# Patient Record
Sex: Female | Born: 1960 | Race: Black or African American | Hispanic: No | Marital: Single | Smoking: Never smoker
Health system: Southern US, Community
[De-identification: ages and names within clinical notes are randomized; demographics above are authoritative.]

## PROBLEM LIST (undated history)

## (undated) DIAGNOSIS — I10 Essential (primary) hypertension: Secondary | ICD-10-CM

## (undated) DIAGNOSIS — R410 Disorientation, unspecified: Secondary | ICD-10-CM

## (undated) DIAGNOSIS — F319 Bipolar disorder, unspecified: Secondary | ICD-10-CM

## (undated) DIAGNOSIS — I639 Cerebral infarction, unspecified: Secondary | ICD-10-CM

## (undated) DIAGNOSIS — I69359 Hemiplegia and hemiparesis following cerebral infarction affecting unspecified side: Secondary | ICD-10-CM

## (undated) DIAGNOSIS — R569 Unspecified convulsions: Secondary | ICD-10-CM

## (undated) DIAGNOSIS — F329 Major depressive disorder, single episode, unspecified: Secondary | ICD-10-CM

## (undated) DIAGNOSIS — F32A Depression, unspecified: Secondary | ICD-10-CM

## (undated) DIAGNOSIS — E119 Type 2 diabetes mellitus without complications: Secondary | ICD-10-CM

## (undated) DIAGNOSIS — F209 Schizophrenia, unspecified: Secondary | ICD-10-CM

## (undated) DIAGNOSIS — IMO0002 Reserved for concepts with insufficient information to code with codable children: Secondary | ICD-10-CM

## (undated) HISTORY — PX: TUBAL LIGATION: SHX77

## (undated) HISTORY — PX: BREAST BIOPSY: SHX20

## (undated) HISTORY — DX: Unspecified convulsions: R56.9

---

## 1999-11-23 ENCOUNTER — Other Ambulatory Visit: Admission: RE | Admit: 1999-11-23 | Discharge: 1999-11-23 | Payer: Self-pay | Admitting: Obstetrics and Gynecology

## 2001-04-05 ENCOUNTER — Other Ambulatory Visit: Admission: RE | Admit: 2001-04-05 | Discharge: 2001-04-05 | Payer: Self-pay | Admitting: Obstetrics and Gynecology

## 2006-11-15 ENCOUNTER — Emergency Department (HOSPITAL_COMMUNITY): Admission: EM | Admit: 2006-11-15 | Discharge: 2006-11-15 | Payer: Self-pay | Admitting: Emergency Medicine

## 2007-02-02 ENCOUNTER — Emergency Department (HOSPITAL_COMMUNITY): Admission: EM | Admit: 2007-02-02 | Discharge: 2007-02-02 | Payer: Self-pay | Admitting: Emergency Medicine

## 2007-03-12 ENCOUNTER — Ambulatory Visit: Payer: Self-pay | Admitting: Internal Medicine

## 2007-03-12 ENCOUNTER — Encounter (INDEPENDENT_AMBULATORY_CARE_PROVIDER_SITE_OTHER): Payer: Self-pay | Admitting: Nurse Practitioner

## 2007-03-12 ENCOUNTER — Ambulatory Visit: Payer: Self-pay | Admitting: *Deleted

## 2007-03-12 LAB — CONVERTED CEMR LAB
ALT: 9 units/L (ref 0–35)
Albumin: 4.5 g/dL (ref 3.5–5.2)
Alkaline Phosphatase: 65 units/L (ref 39–117)
Basophils Absolute: 0 10*3/uL (ref 0.0–0.1)
CO2: 25 meq/L (ref 19–32)
Eosinophils Relative: 1 % (ref 0–5)
Glucose, Bld: 66 mg/dL — ABNORMAL LOW (ref 70–99)
HCT: 43.3 % (ref 36.0–46.0)
Lymphocytes Relative: 39 % (ref 12–46)
Lymphs Abs: 2.8 10*3/uL (ref 0.7–4.0)
Neutro Abs: 3.8 10*3/uL (ref 1.7–7.7)
Neutrophils Relative %: 53 % (ref 43–77)
Platelets: 297 10*3/uL (ref 150–400)
Potassium: 4.4 meq/L (ref 3.5–5.3)
RDW: 12.4 % (ref 11.5–15.5)
Sodium: 143 meq/L (ref 135–145)
TSH: 1.739 microintl units/mL (ref 0.350–5.50)
Total Bilirubin: 0.5 mg/dL (ref 0.3–1.2)
Total Protein: 7.7 g/dL (ref 6.0–8.3)
WBC: 7.2 10*3/uL (ref 4.0–10.5)

## 2007-03-13 ENCOUNTER — Ambulatory Visit (HOSPITAL_COMMUNITY): Admission: RE | Admit: 2007-03-13 | Discharge: 2007-03-13 | Payer: Self-pay | Admitting: Family Medicine

## 2007-05-22 ENCOUNTER — Encounter (INDEPENDENT_AMBULATORY_CARE_PROVIDER_SITE_OTHER): Payer: Self-pay | Admitting: Nurse Practitioner

## 2007-05-22 ENCOUNTER — Ambulatory Visit: Payer: Self-pay | Admitting: Internal Medicine

## 2007-06-18 ENCOUNTER — Ambulatory Visit: Payer: Self-pay | Admitting: Internal Medicine

## 2007-06-18 ENCOUNTER — Encounter (INDEPENDENT_AMBULATORY_CARE_PROVIDER_SITE_OTHER): Payer: Self-pay | Admitting: Nurse Practitioner

## 2007-06-18 LAB — CONVERTED CEMR LAB
Total CHOL/HDL Ratio: 2.9
VLDL: 20 mg/dL (ref 0–40)

## 2007-08-08 ENCOUNTER — Ambulatory Visit: Payer: Self-pay | Admitting: Internal Medicine

## 2007-09-26 ENCOUNTER — Ambulatory Visit: Payer: Self-pay | Admitting: Internal Medicine

## 2007-11-15 ENCOUNTER — Ambulatory Visit: Payer: Self-pay | Admitting: Psychiatry

## 2007-11-15 ENCOUNTER — Inpatient Hospital Stay (HOSPITAL_COMMUNITY): Admission: AD | Admit: 2007-11-15 | Discharge: 2007-11-28 | Payer: Self-pay | Admitting: Psychiatry

## 2008-02-28 ENCOUNTER — Ambulatory Visit: Payer: Self-pay | Admitting: Internal Medicine

## 2008-03-03 ENCOUNTER — Encounter (INDEPENDENT_AMBULATORY_CARE_PROVIDER_SITE_OTHER): Payer: Self-pay | Admitting: Internal Medicine

## 2008-03-03 LAB — CONVERTED CEMR LAB
AST: 28 units/L (ref 0–37)
Albumin: 4.6 g/dL (ref 3.5–5.2)
Alkaline Phosphatase: 91 units/L (ref 39–117)
Calcium: 10 mg/dL (ref 8.4–10.5)
Chloride: 99 meq/L (ref 96–112)
Potassium: 3.7 meq/L (ref 3.5–5.3)
Sodium: 139 meq/L (ref 135–145)
Total Protein: 8.1 g/dL (ref 6.0–8.3)

## 2008-03-04 ENCOUNTER — Ambulatory Visit (HOSPITAL_COMMUNITY): Admission: RE | Admit: 2008-03-04 | Discharge: 2008-03-04 | Payer: Self-pay | Admitting: Internal Medicine

## 2008-03-16 ENCOUNTER — Ambulatory Visit (HOSPITAL_COMMUNITY): Admission: RE | Admit: 2008-03-16 | Discharge: 2008-03-16 | Payer: Self-pay | Admitting: Internal Medicine

## 2008-11-18 ENCOUNTER — Ambulatory Visit: Payer: Self-pay | Admitting: Family Medicine

## 2008-11-18 ENCOUNTER — Encounter (INDEPENDENT_AMBULATORY_CARE_PROVIDER_SITE_OTHER): Payer: Self-pay | Admitting: Internal Medicine

## 2008-11-18 ENCOUNTER — Other Ambulatory Visit: Admission: RE | Admit: 2008-11-18 | Discharge: 2008-11-18 | Payer: Self-pay | Admitting: Internal Medicine

## 2008-11-20 ENCOUNTER — Ambulatory Visit: Payer: Self-pay | Admitting: Internal Medicine

## 2008-11-25 ENCOUNTER — Ambulatory Visit: Payer: Self-pay | Admitting: Internal Medicine

## 2008-11-26 ENCOUNTER — Encounter (INDEPENDENT_AMBULATORY_CARE_PROVIDER_SITE_OTHER): Payer: Self-pay | Admitting: Internal Medicine

## 2008-11-26 LAB — CONVERTED CEMR LAB
Albumin: 4.2 g/dL (ref 3.5–5.2)
Alkaline Phosphatase: 81 units/L (ref 39–117)
BUN: 11 mg/dL (ref 6–23)
Basophils Absolute: 0 10*3/uL (ref 0.0–0.1)
Basophils Relative: 0 % (ref 0–1)
Creatinine, Ser: 0.85 mg/dL (ref 0.40–1.20)
Eosinophils Absolute: 0.1 10*3/uL (ref 0.0–0.7)
Eosinophils Relative: 2 % (ref 0–5)
Glucose, Bld: 287 mg/dL — ABNORMAL HIGH (ref 70–99)
HCT: 42.6 % (ref 36.0–46.0)
Hemoglobin: 14.4 g/dL (ref 12.0–15.0)
MCHC: 33.8 g/dL (ref 30.0–36.0)
MCV: 89.5 fL (ref 78.0–100.0)
Monocytes Absolute: 0.5 10*3/uL (ref 0.1–1.0)
Platelets: 269 10*3/uL (ref 150–400)
Potassium: 4.4 meq/L (ref 3.5–5.3)
RDW: 11.9 % (ref 11.5–15.5)
Total Bilirubin: 0.5 mg/dL (ref 0.3–1.2)

## 2008-11-30 ENCOUNTER — Encounter (INDEPENDENT_AMBULATORY_CARE_PROVIDER_SITE_OTHER): Payer: Self-pay | Admitting: Internal Medicine

## 2008-11-30 LAB — CONVERTED CEMR LAB
HCV Ab: NEGATIVE
Hep A Total Ab: NEGATIVE
Hep B S Ab: NEGATIVE

## 2008-12-04 ENCOUNTER — Ambulatory Visit (HOSPITAL_COMMUNITY): Admission: RE | Admit: 2008-12-04 | Discharge: 2008-12-04 | Payer: Self-pay | Admitting: Internal Medicine

## 2008-12-09 ENCOUNTER — Ambulatory Visit: Payer: Self-pay | Admitting: Internal Medicine

## 2009-01-27 ENCOUNTER — Ambulatory Visit: Payer: Self-pay | Admitting: Family Medicine

## 2009-04-21 ENCOUNTER — Ambulatory Visit (HOSPITAL_COMMUNITY): Admission: RE | Admit: 2009-04-21 | Discharge: 2009-04-21 | Payer: Self-pay | Admitting: Gynecology

## 2009-06-30 ENCOUNTER — Encounter (INDEPENDENT_AMBULATORY_CARE_PROVIDER_SITE_OTHER): Payer: Self-pay | Admitting: Internal Medicine

## 2009-06-30 ENCOUNTER — Ambulatory Visit: Payer: Self-pay | Admitting: Family Medicine

## 2009-06-30 LAB — CONVERTED CEMR LAB
Alkaline Phosphatase: 86 units/L (ref 39–117)
Glucose, Bld: 294 mg/dL — ABNORMAL HIGH (ref 70–99)
Hgb A1c MFr Bld: 12.2 % — ABNORMAL HIGH (ref <5.7)
Sodium: 137 meq/L (ref 135–145)
Total Bilirubin: 0.5 mg/dL (ref 0.3–1.2)
Total Protein: 7.7 g/dL (ref 6.0–8.3)

## 2009-07-14 ENCOUNTER — Ambulatory Visit: Payer: Self-pay | Admitting: Internal Medicine

## 2009-08-04 ENCOUNTER — Ambulatory Visit: Payer: Self-pay | Admitting: Internal Medicine

## 2009-08-04 LAB — CONVERTED CEMR LAB
CO2: 27 meq/L (ref 19–32)
Chloride: 97 meq/L (ref 96–112)
Creatinine, Ser: 0.97 mg/dL (ref 0.40–1.20)
Sodium: 137 meq/L (ref 135–145)

## 2009-08-11 ENCOUNTER — Ambulatory Visit: Payer: Self-pay | Admitting: Internal Medicine

## 2009-09-01 ENCOUNTER — Ambulatory Visit: Payer: Self-pay | Admitting: Internal Medicine

## 2009-09-01 LAB — CONVERTED CEMR LAB
BUN: 13 mg/dL (ref 6–23)
CO2: 28 meq/L (ref 19–32)
Chloride: 100 meq/L (ref 96–112)
Creatinine, Ser: 0.87 mg/dL (ref 0.40–1.20)

## 2009-09-08 ENCOUNTER — Ambulatory Visit: Payer: Self-pay | Admitting: Internal Medicine

## 2009-10-04 ENCOUNTER — Inpatient Hospital Stay (HOSPITAL_COMMUNITY): Admission: EM | Admit: 2009-10-04 | Discharge: 2009-10-06 | Payer: Self-pay | Admitting: Emergency Medicine

## 2009-10-05 ENCOUNTER — Ambulatory Visit: Payer: Self-pay | Admitting: Psychiatry

## 2009-10-06 ENCOUNTER — Inpatient Hospital Stay (HOSPITAL_COMMUNITY): Admission: AD | Admit: 2009-10-06 | Discharge: 2009-10-10 | Payer: Self-pay | Admitting: Psychiatry

## 2009-10-06 ENCOUNTER — Ambulatory Visit: Payer: Self-pay | Admitting: Psychiatry

## 2009-10-13 ENCOUNTER — Ambulatory Visit: Payer: Self-pay | Admitting: Internal Medicine

## 2009-11-10 ENCOUNTER — Ambulatory Visit: Payer: Self-pay | Admitting: Internal Medicine

## 2009-11-10 LAB — CONVERTED CEMR LAB
Alkaline Phosphatase: 80 units/L (ref 39–117)
BUN: 11 mg/dL (ref 6–23)
Direct LDL: 105 mg/dL — ABNORMAL HIGH
Glucose, Bld: 235 mg/dL — ABNORMAL HIGH (ref 70–99)
Total Bilirubin: 0.4 mg/dL (ref 0.3–1.2)

## 2010-02-03 ENCOUNTER — Encounter (INDEPENDENT_AMBULATORY_CARE_PROVIDER_SITE_OTHER): Payer: Self-pay | Admitting: *Deleted

## 2010-02-03 ENCOUNTER — Ambulatory Visit: Payer: Self-pay | Admitting: Internal Medicine

## 2010-02-03 LAB — CONVERTED CEMR LAB
Cholesterol: 195 mg/dL (ref 0–200)
HDL: 38 mg/dL — ABNORMAL LOW (ref >39)
Hgb A1c MFr Bld: 13.6 % — ABNORMAL HIGH (ref <5.7)
Total CHOL/HDL Ratio: 5.1

## 2010-02-24 ENCOUNTER — Encounter (INDEPENDENT_AMBULATORY_CARE_PROVIDER_SITE_OTHER): Payer: Self-pay | Admitting: Family Medicine

## 2010-02-24 LAB — CONVERTED CEMR LAB
HDL: 39 mg/dL — ABNORMAL LOW (ref >39)
LDL Cholesterol: 117 mg/dL — ABNORMAL HIGH (ref 0–99)
Triglycerides: 209 mg/dL — ABNORMAL HIGH (ref <150)
VLDL: 42 mg/dL — ABNORMAL HIGH (ref 0–40)

## 2010-04-07 ENCOUNTER — Other Ambulatory Visit (HOSPITAL_COMMUNITY): Payer: Self-pay | Admitting: Family Medicine

## 2010-04-07 DIAGNOSIS — Z0000 Encounter for general adult medical examination without abnormal findings: Secondary | ICD-10-CM

## 2010-04-22 ENCOUNTER — Ambulatory Visit (HOSPITAL_COMMUNITY): Admission: RE | Admit: 2010-04-22 | Payer: Self-pay | Source: Home / Self Care | Admitting: Family Medicine

## 2010-04-22 ENCOUNTER — Ambulatory Visit (HOSPITAL_COMMUNITY)
Admission: RE | Admit: 2010-04-22 | Discharge: 2010-04-22 | Disposition: A | Discharge status: Home / Self Care | Payer: Medicaid Other | Source: Ambulatory Visit | Attending: Family Medicine | Admitting: Family Medicine

## 2010-04-22 DIAGNOSIS — Z0000 Encounter for general adult medical examination without abnormal findings: Secondary | ICD-10-CM

## 2010-04-22 DIAGNOSIS — Z1231 Encounter for screening mammogram for malignant neoplasm of breast: Secondary | ICD-10-CM | POA: Insufficient documentation

## 2010-06-04 LAB — GLUCOSE, CAPILLARY
Glucose-Capillary: 108 mg/dL — ABNORMAL HIGH (ref 70–99)
Glucose-Capillary: 147 mg/dL — ABNORMAL HIGH (ref 70–99)
Glucose-Capillary: 157 mg/dL — ABNORMAL HIGH (ref 70–99)
Glucose-Capillary: 162 mg/dL — ABNORMAL HIGH (ref 70–99)
Glucose-Capillary: 195 mg/dL — ABNORMAL HIGH (ref 70–99)
Glucose-Capillary: 217 mg/dL — ABNORMAL HIGH (ref 70–99)
Glucose-Capillary: 242 mg/dL — ABNORMAL HIGH (ref 70–99)
Glucose-Capillary: 372 mg/dL — ABNORMAL HIGH (ref 70–99)
Glucose-Capillary: 102 mg/dL — ABNORMAL HIGH (ref 70–99)
Glucose-Capillary: 127 mg/dL — ABNORMAL HIGH (ref 70–99)
Glucose-Capillary: 132 mg/dL — ABNORMAL HIGH (ref 70–99)
Glucose-Capillary: 140 mg/dL — ABNORMAL HIGH (ref 70–99)
Glucose-Capillary: 164 mg/dL — ABNORMAL HIGH (ref 70–99)
Glucose-Capillary: 196 mg/dL — ABNORMAL HIGH (ref 70–99)
Glucose-Capillary: 199 mg/dL — ABNORMAL HIGH (ref 70–99)
Glucose-Capillary: 218 mg/dL — ABNORMAL HIGH (ref 70–99)
Glucose-Capillary: 266 mg/dL — ABNORMAL HIGH (ref 70–99)
Glucose-Capillary: 404 mg/dL — ABNORMAL HIGH (ref 70–99)
Glucose-Capillary: 451 mg/dL — ABNORMAL HIGH (ref 70–99)
Glucose-Capillary: 524 mg/dL — ABNORMAL HIGH (ref 70–99)

## 2010-06-04 LAB — COMPREHENSIVE METABOLIC PANEL
AST: 16 U/L (ref 0–37)
Albumin: 3.5 g/dL (ref 3.5–5.2)
Alkaline Phosphatase: 63 U/L (ref 39–117)
BUN: 3 mg/dL — ABNORMAL LOW (ref 6–23)
Chloride: 108 mEq/L (ref 96–112)
GFR calc Af Amer: >60 mL/min (ref >60)
Potassium: 3.3 mEq/L — ABNORMAL LOW (ref 3.5–5.1)
Total Bilirubin: 0.7 mg/dL (ref 0.3–1.2)

## 2010-06-04 LAB — URINE MICROSCOPIC-ADD ON

## 2010-06-04 LAB — DIFFERENTIAL
Basophils Absolute: 0 10*3/uL (ref 0.0–0.1)
Basophils Relative: 1 % (ref 0–1)
Eosinophils Absolute: 0 10*3/uL (ref 0.0–0.7)
Monocytes Relative: 4 % (ref 3–12)
Neutro Abs: 7 10*3/uL (ref 1.7–7.7)
Neutrophils Relative %: 78 % — ABNORMAL HIGH (ref 43–77)

## 2010-06-04 LAB — CULTURE, BLOOD (ROUTINE X 2): Culture: NO GROWTH

## 2010-06-04 LAB — POCT I-STAT, CHEM 8
Chloride: 101 mEq/L (ref 96–112)
Glucose, Bld: 559 mg/dL (ref 70–99)
HCT: 44 % (ref 36.0–46.0)
Hemoglobin: 15 g/dL (ref 12.0–15.0)
Potassium: 3.6 mEq/L (ref 3.5–5.1)
Sodium: 136 mEq/L (ref 135–145)

## 2010-06-04 LAB — LIPID PANEL
LDL Cholesterol: 92 mg/dL (ref 0–99)
Triglycerides: 133 mg/dL (ref <150)
VLDL: 27 mg/dL (ref 0–40)

## 2010-06-04 LAB — BLOOD GAS, VENOUS
pCO2, Ven: 39.3 mmHg — ABNORMAL LOW (ref 45.0–50.0)
pH, Ven: 7.366 — ABNORMAL HIGH (ref 7.250–7.300)
pO2, Ven: 44.5 mmHg (ref 30.0–45.0)

## 2010-06-04 LAB — URINALYSIS, ROUTINE W REFLEX MICROSCOPIC
Glucose, UA: >1000 mg/dL — AB
Hgb urine dipstick: NEGATIVE
Leukocytes, UA: NEGATIVE
Specific Gravity, Urine: 1.037 — ABNORMAL HIGH (ref 1.005–1.030)

## 2010-06-04 LAB — ALDOSTERONE + RENIN ACTIVITY W/ RATIO: PRA LC/MS/MS: 0.19 ng/mL/h — ABNORMAL LOW (ref 0.25–5.82)

## 2010-06-04 LAB — POCT PREGNANCY, URINE: Preg Test, Ur: NEGATIVE

## 2010-06-04 LAB — URINE CULTURE

## 2010-06-04 LAB — HEMOGLOBIN A1C
Hgb A1c MFr Bld: 10.5 % — ABNORMAL HIGH (ref <5.7)
Mean Plasma Glucose: 255 mg/dL — ABNORMAL HIGH (ref <117)

## 2010-06-04 LAB — CBC
Hemoglobin: 14 g/dL (ref 12.0–15.0)
MCHC: 34.5 g/dL (ref 30.0–36.0)
Platelets: 239 10*3/uL (ref 150–400)

## 2010-06-04 LAB — ETHANOL: Alcohol, Ethyl (B): 7 mg/dL (ref 0–10)

## 2010-06-04 LAB — RAPID URINE DRUG SCREEN, HOSP PERFORMED: Barbiturates: NOT DETECTED
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
## 2010-12-27 LAB — I-STAT 8, (EC8 V) (CONVERTED LAB)
Acid-Base Excess: 6 — ABNORMAL HIGH
Chloride: 107
Glucose, Bld: 94
Potassium: 3.6
pH, Ven: 7.508 — ABNORMAL HIGH

## 2010-12-30 LAB — CULTURE, BLOOD (ROUTINE X 2)
Culture: NO GROWTH
Culture: NO GROWTH

## 2010-12-30 LAB — I-STAT 8, (EC8 V) (CONVERTED LAB)
BUN: 12
Chloride: 106
Glucose, Bld: 172 — ABNORMAL HIGH
HCT: 50 — ABNORMAL HIGH
Hemoglobin: 17 — ABNORMAL HIGH
Operator id: 279831
Sodium: 142

## 2010-12-30 LAB — POCT I-STAT CREATININE
Creatinine, Ser: 1
Operator id: 279831

## 2010-12-30 LAB — URINALYSIS, ROUTINE W REFLEX MICROSCOPIC
Bilirubin Urine: NEGATIVE
Glucose, UA: NEGATIVE
Hgb urine dipstick: NEGATIVE
Protein, ur: 30 — AB
Urobilinogen, UA: 0.2

## 2010-12-30 LAB — DIFFERENTIAL
Basophils Absolute: 0
Basophils Relative: 0
Eosinophils Relative: 0
Lymphocytes Relative: 22
Monocytes Absolute: 0.4
Neutro Abs: 5.7

## 2010-12-30 LAB — CBC
MCHC: 34.2
Platelets: 287
RDW: 12.1

## 2010-12-30 LAB — RAPID URINE DRUG SCREEN, HOSP PERFORMED
Barbiturates: NOT DETECTED
Benzodiazepines: NOT DETECTED
Opiates: NOT DETECTED

## 2010-12-30 LAB — URINE MICROSCOPIC-ADD ON

## 2011-03-28 ENCOUNTER — Other Ambulatory Visit (HOSPITAL_COMMUNITY): Payer: Self-pay | Admitting: Family Medicine

## 2011-03-28 DIAGNOSIS — Z1231 Encounter for screening mammogram for malignant neoplasm of breast: Secondary | ICD-10-CM

## 2011-04-25 ENCOUNTER — Ambulatory Visit (HOSPITAL_COMMUNITY)
Admission: RE | Admit: 2011-04-25 | Discharge: 2011-04-25 | Disposition: A | Discharge status: Home / Self Care | Payer: Medicaid Other | Source: Ambulatory Visit | Attending: Family Medicine | Admitting: Family Medicine

## 2011-04-25 DIAGNOSIS — Z1231 Encounter for screening mammogram for malignant neoplasm of breast: Secondary | ICD-10-CM | POA: Insufficient documentation

## 2011-09-28 ENCOUNTER — Emergency Department (HOSPITAL_COMMUNITY)
Admission: EM | Admit: 2011-09-28 | Discharge: 2011-09-28 | Disposition: A | Discharge status: Home / Self Care | Payer: Medicaid Other | Attending: Emergency Medicine | Admitting: Emergency Medicine

## 2011-09-28 ENCOUNTER — Encounter (HOSPITAL_COMMUNITY): Payer: Self-pay | Admitting: Emergency Medicine

## 2011-09-28 DIAGNOSIS — Z794 Long term (current) use of insulin: Secondary | ICD-10-CM | POA: Insufficient documentation

## 2011-09-28 DIAGNOSIS — I10 Essential (primary) hypertension: Secondary | ICD-10-CM | POA: Insufficient documentation

## 2011-09-28 DIAGNOSIS — F17200 Nicotine dependence, unspecified, uncomplicated: Secondary | ICD-10-CM | POA: Insufficient documentation

## 2011-09-28 DIAGNOSIS — Z79899 Other long term (current) drug therapy: Secondary | ICD-10-CM | POA: Insufficient documentation

## 2011-09-28 DIAGNOSIS — E119 Type 2 diabetes mellitus without complications: Secondary | ICD-10-CM | POA: Insufficient documentation

## 2011-09-28 DIAGNOSIS — E1165 Type 2 diabetes mellitus with hyperglycemia: Secondary | ICD-10-CM

## 2011-09-28 DIAGNOSIS — N390 Urinary tract infection, site not specified: Secondary | ICD-10-CM | POA: Insufficient documentation

## 2011-09-28 HISTORY — DX: Essential (primary) hypertension: I10

## 2011-09-28 LAB — BASIC METABOLIC PANEL
BUN: 12 mg/dL (ref 6–23)
CO2: 23 mEq/L (ref 19–32)
Calcium: 10.1 mg/dL (ref 8.4–10.5)
Chloride: 99 mEq/L (ref 96–112)
Creatinine, Ser: 0.91 mg/dL (ref 0.50–1.10)
GFR calc Af Amer: 85 mL/min — ABNORMAL LOW (ref >90)
GFR calc non Af Amer: 73 mL/min — ABNORMAL LOW (ref >90)
Glucose, Bld: 246 mg/dL — ABNORMAL HIGH (ref 70–99)
Potassium: 3.9 mEq/L (ref 3.5–5.1)
Sodium: 135 mEq/L (ref 135–145)

## 2011-09-28 LAB — URINALYSIS, ROUTINE W REFLEX MICROSCOPIC
Glucose, UA: >1000 mg/dL — AB
Ketones, ur: 15 mg/dL — AB
Nitrite: POSITIVE — AB
Protein, ur: 30 mg/dL — AB
Specific Gravity, Urine: 1.037 — ABNORMAL HIGH (ref 1.005–1.030)
Urobilinogen, UA: 0.2 mg/dL (ref 0.0–1.0)
pH: 5 (ref 5.0–8.0)

## 2011-09-28 LAB — CBC
HCT: 41.9 % (ref 36.0–46.0)
Hemoglobin: 15.2 g/dL — ABNORMAL HIGH (ref 12.0–15.0)
MCH: 30.6 pg (ref 26.0–34.0)
MCHC: 36.3 g/dL — ABNORMAL HIGH (ref 30.0–36.0)
MCV: 84.3 fL (ref 78.0–100.0)
Platelets: 213 10*3/uL (ref 150–400)
RBC: 4.97 MIL/uL (ref 3.87–5.11)
RDW: 11.9 % (ref 11.5–15.5)
WBC: 8.1 10*3/uL (ref 4.0–10.5)

## 2011-09-28 LAB — URINE MICROSCOPIC-ADD ON

## 2011-09-28 LAB — GLUCOSE, CAPILLARY

## 2011-09-28 MED ORDER — SULFAMETHOXAZOLE-TMP DS 800-160 MG PO TABS
1.0000 | ORAL_TABLET | Freq: Once | ORAL | Status: AC
  Administered 2011-09-28: 1 via ORAL
  Filled 2011-09-28: qty 1

## 2011-09-28 MED ORDER — SULFAMETHOXAZOLE-TRIMETHOPRIM 800-160 MG PO TABS: 1.0000 | ORAL_TABLET | Freq: Two times a day (BID) | ORAL | Status: AC

## 2011-09-28 NOTE — ED Provider Notes (Signed)
History   This chart was scribed for Raeford Razor, MD by Charolett Bumpers . The patient was seen in room TR10C/TR10C.    CSN: [id number]  Arrival date & time 09/28/11  1551   First MD Initiated Contact with Patient 09/28/11 1614      Chief Complaint  Patient presents with  . Hyperglycemia    (Consider location/radiation/quality/duration/timing/severity/associated sxs/prior treatment) HPI [name] is a 51 y.o. female who presents to the Emergency Department complaining of constant, moderate hyperglycemia since earlier today. Patient reports that she had a check up today with PCP, with a blood sugar that was too high to get a reading. Pt reports a h/o Type II Diabetes. Pt states that she takes Insulin (Novolog and Lantus), but has not taken her Insulin for the past 2 weeks due to financial reasons. Pt states that she plans to get the medication this week. Pt states that she has a glucometer and testing strips to test her blood sugar that was ordered today. Pt states that she currently feels fine. Patient denies feeling any more fatigued than usual or an increase in urinary frequency. Pt denies any nausea, abdominal pain, or fevers. Patient reports a mild headache and intermittent mild chills. Patient also reports a h/o HTN, and reports medication compliance.   PCP: Dr. Andrey Campanile with [facility].   Past Medical History  Diagnosis Date  . Hypertension   . Diabetes mellitus     History reviewed. No pertinent past surgical history.  History reviewed. No pertinent family history.  History  Substance Use Topics  . Smoking status: Current Everyday Smoker  . Smokeless tobacco: Not on file  . Alcohol Use: No    OB History    Grav Para Term Preterm Abortions TAB SAB Ect Mult Living                  Review of Systems  Constitutional: Positive for chills. Negative for fatigue.  Gastrointestinal: Negative for nausea and abdominal pain.  Genitourinary: Negative for  frequency.  Neurological: Positive for headaches.    Allergies  Review of patient's allergies indicates no known allergies.  Home Medications   Current Outpatient Rx  Name Route Sig Dispense Refill  . INSULIN ASPART 100 UNIT/ML [place] SOLN Subcutaneous Inject 20 Units into the skin daily after breakfast.    . INSULIN GLARGINE 100 UNIT/ML [place] SOLN Subcutaneous Inject 30 Units into the skin at bedtime.    Marland Kitchen LISINOPRIL 20 MG PO TABS Oral Take 20 mg by mouth daily.    Marland Kitchen METOPROLOL TARTRATE 50 MG PO TABS Oral Take 50 mg by mouth daily.    Marland Kitchen RISPERIDONE 0.5 MG PO TABS Oral Take 0.5 mg by mouth daily.      BP 145/86  Pulse 92  Temp 98.7 F (37.1 C) (Oral)  Resp 18  SpO2 99%  Physical Exam  Nursing note and vitals reviewed. Constitutional: She is oriented to person, place, and time. She appears well-developed and well-nourished. No distress.  HENT:  Head: Normocephalic and atraumatic.  Eyes: EOM are normal.  Neck: Normal range of motion. Neck supple. No tracheal deviation present.  Cardiovascular: Normal rate, regular rhythm and normal heart sounds.   Pulmonary/Chest: Effort normal and breath sounds normal. No respiratory distress.  Musculoskeletal: Normal range of motion.  Neurological: She is alert and oriented to person, place, and time.  Skin: Skin is warm and dry.  Psychiatric: She has a normal mood and affect. Her behavior is normal.  ED Course  Procedures (including critical care time)  DIAGNOSTIC STUDIES: Oxygen Saturation is 99% on room air, normal by my interpretation.    COORDINATION OF CARE:  1647: Discussed planned course of treatment with the patient, who is agreeable at this time.  1700: Medication Orders: Sulfamethoxazole-trimethoprim (Bactrim DS) 800-160 mg per tablet 1 tablet-once.  1823: Recheck: Informed patient of lab results and will start pt on abx for a UTI. Discussed planned d/c, pt is agreeable.   Results for orders placed during the hospital  encounter of 09/28/11  GLUCOSE, CAPILLARY      Component Value Range   Glucose-Capillary 250 (*) 70 - 99 mg/dL   Comment 1 Documented in Chart     Comment 2 Notify RN    URINALYSIS, ROUTINE W REFLEX MICROSCOPIC      Component Value Range   Color, Urine YELLOW  YELLOW   APPearance TURBID (*) CLEAR   Specific Gravity, Urine 1.037 (*) 1.005 - 1.030   pH 5.0  5.0 - 8.0   Glucose, UA >1000 (*) NEGATIVE mg/dL   Hgb urine dipstick TRACE (*) NEGATIVE   Bilirubin Urine SMALL (*) NEGATIVE   Ketones, ur 15 (*) NEGATIVE mg/dL   Protein, ur 30 (*) NEGATIVE mg/dL   Urobilinogen, UA 0.2  0.0 - 1.0 mg/dL   Nitrite POSITIVE (*) NEGATIVE   Leukocytes, UA SMALL (*) NEGATIVE  BASIC METABOLIC PANEL      Component Value Range   Sodium 135  135 - 145 mEq/L   Potassium 3.9  3.5 - 5.1 mEq/L   Chloride 99  96 - 112 mEq/L   CO2 23  19 - 32 mEq/L   Glucose, Bld 246 (*) 70 - 99 mg/dL   BUN 12  6 - 23 mg/dL   Creatinine, Ser 9.60  0.50 - 1.10 mg/dL   Calcium 45.4  8.4 - 09.8 mg/dL   GFR calc non Af Amer 73 (*) >90 mL/min   GFR calc Af Amer 85 (*) >90 mL/min  CBC      Component Value Range   WBC 8.1  4.0 - 10.5 K/uL   RBC 4.97  3.87 - 5.11 MIL/uL   Hemoglobin 15.2 (*) 12.0 - 15.0 g/dL   HCT 11.9  14.7 - 82.9 %   MCV 84.3  78.0 - 100.0 fL   MCH 30.6  26.0 - 34.0 pg   MCHC 36.3 (*) 30.0 - 36.0 g/dL   RDW 56.2  13.0 - 86.5 %   Platelets 213  150 - 400 K/uL  URINE MICROSCOPIC-ADD ON      Component Value Range   Squamous Epithelial / LPF FEW (*) RARE   WBC, UA 11-20  <3 WBC/hpf   RBC / HPF 0-2  <3 RBC/hpf   Bacteria, UA MANY (*) RARE   Casts GRANULAR CAST (*) NEGATIVE   Urine-Other AMORPHOUS URATES/PHOSPHATES      No results found.   1. Poorly controlled diabetes mellitus   2. Urinary tract infection       MDM  51 year old female with poorly controlled diabetes. Suspect this is related to medicine noncompliance. Assistance in obtaining her medicines was offered. Patient states that she has  the means to get her insulin though. There is no evidence of acidosis. Workup is also significant for urinary tract infection. Patient is afebrile and has benign abdominal exam. Dose can be treated as an outpatient at this time. Prescription for Bactrim was provided. Return precautions were discussed. Outpatient followup for further management  of her diabetes.  I personally preformed the services scribed in my presence. The recorded information has been reviewed and considered. Raeford Razor, MD.        Raeford Razor, MD 10/01/11 [phone]

## 2011-09-28 NOTE — ED Notes (Signed)
Pt denies any complaints.

## 2011-09-28 NOTE — ED Notes (Signed)
Pt c/o hyperglycemia; pt sts has not had insulin x 2 weeks doing to being unable to afford; pt sts increased thirst

## 2013-05-27 ENCOUNTER — Inpatient Hospital Stay (HOSPITAL_COMMUNITY)
Admission: EM | Admit: 2013-05-27 | Discharge: 2013-06-04 | DRG: 065 | Disposition: A | Discharge status: Home / Self Care | Payer: Medicaid Other | Attending: Internal Medicine | Admitting: Internal Medicine

## 2013-05-27 ENCOUNTER — Encounter (HOSPITAL_COMMUNITY): Payer: Self-pay | Admitting: Emergency Medicine

## 2013-05-27 ENCOUNTER — Inpatient Hospital Stay (HOSPITAL_COMMUNITY): Payer: Medicaid Other

## 2013-05-27 ENCOUNTER — Emergency Department (HOSPITAL_COMMUNITY): Payer: Medicaid Other

## 2013-05-27 DIAGNOSIS — E1149 Type 2 diabetes mellitus with other diabetic neurological complication: Secondary | ICD-10-CM | POA: Diagnosis present

## 2013-05-27 DIAGNOSIS — I10 Essential (primary) hypertension: Secondary | ICD-10-CM | POA: Diagnosis present

## 2013-05-27 DIAGNOSIS — D751 Secondary polycythemia: Secondary | ICD-10-CM

## 2013-05-27 DIAGNOSIS — E119 Type 2 diabetes mellitus without complications: Secondary | ICD-10-CM | POA: Diagnosis present

## 2013-05-27 DIAGNOSIS — G8190 Hemiplegia, unspecified affecting unspecified side: Secondary | ICD-10-CM | POA: Diagnosis present

## 2013-05-27 DIAGNOSIS — Z23 Encounter for immunization: Secondary | ICD-10-CM

## 2013-05-27 DIAGNOSIS — D45 Polycythemia vera: Secondary | ICD-10-CM | POA: Diagnosis present

## 2013-05-27 DIAGNOSIS — G9349 Other encephalopathy: Secondary | ICD-10-CM

## 2013-05-27 DIAGNOSIS — D750 Familial erythrocytosis: Secondary | ICD-10-CM | POA: Diagnosis present

## 2013-05-27 DIAGNOSIS — I6783 Posterior reversible encephalopathy syndrome: Secondary | ICD-10-CM | POA: Diagnosis present

## 2013-05-27 DIAGNOSIS — I674 Hypertensive encephalopathy: Secondary | ICD-10-CM | POA: Diagnosis present

## 2013-05-27 DIAGNOSIS — I6340 Cerebral infarction due to embolism of unspecified cerebral artery: Principal | ICD-10-CM | POA: Diagnosis present

## 2013-05-27 DIAGNOSIS — R739 Hyperglycemia, unspecified: Secondary | ICD-10-CM

## 2013-05-27 DIAGNOSIS — I639 Cerebral infarction, unspecified: Secondary | ICD-10-CM | POA: Diagnosis present

## 2013-05-27 DIAGNOSIS — E785 Hyperlipidemia, unspecified: Secondary | ICD-10-CM | POA: Diagnosis present

## 2013-05-27 DIAGNOSIS — Z5309 Procedure and treatment not carried out because of other contraindication: Secondary | ICD-10-CM

## 2013-05-27 DIAGNOSIS — R4701 Aphasia: Secondary | ICD-10-CM | POA: Diagnosis present

## 2013-05-27 DIAGNOSIS — R29810 Facial weakness: Secondary | ICD-10-CM | POA: Diagnosis present

## 2013-05-27 DIAGNOSIS — F319 Bipolar disorder, unspecified: Secondary | ICD-10-CM | POA: Diagnosis present

## 2013-05-27 DIAGNOSIS — Z79899 Other long term (current) drug therapy: Secondary | ICD-10-CM

## 2013-05-27 DIAGNOSIS — R1310 Dysphagia, unspecified: Secondary | ICD-10-CM | POA: Diagnosis present

## 2013-05-27 DIAGNOSIS — F17200 Nicotine dependence, unspecified, uncomplicated: Secondary | ICD-10-CM | POA: Diagnosis present

## 2013-05-27 DIAGNOSIS — I161 Hypertensive emergency: Secondary | ICD-10-CM | POA: Diagnosis present

## 2013-05-27 DIAGNOSIS — IMO0002 Reserved for concepts with insufficient information to code with codable children: Secondary | ICD-10-CM | POA: Diagnosis present

## 2013-05-27 HISTORY — DX: Cerebral infarction, unspecified: I63.9

## 2013-05-27 HISTORY — DX: Bipolar disorder, unspecified: F31.9

## 2013-05-27 HISTORY — DX: Type 2 diabetes mellitus without complications: E11.9

## 2013-05-27 HISTORY — DX: Schizophrenia, unspecified: F20.9

## 2013-05-27 HISTORY — DX: Depression, unspecified: F32.A

## 2013-05-27 HISTORY — DX: Major depressive disorder, single episode, unspecified: F32.9

## 2013-05-27 LAB — APTT: aPTT: 26 seconds (ref 24–37)

## 2013-05-27 LAB — MRSA PCR SCREENING: MRSA BY PCR: NEGATIVE

## 2013-05-27 LAB — DIFFERENTIAL
Basophils Absolute: 0 10*3/uL (ref 0.0–0.1)
Basophils Relative: 0 % (ref 0–1)
EOS ABS: 0.1 10*3/uL (ref 0.0–0.7)
Eosinophils Relative: 1 % (ref 0–5)
LYMPHS ABS: 3 10*3/uL (ref 0.7–4.0)
Lymphocytes Relative: 36 % (ref 12–46)
MONO ABS: 0.4 10*3/uL (ref 0.1–1.0)
MONOS PCT: 4 % (ref 3–12)
NEUTROS PCT: 58 % (ref 43–77)
Neutro Abs: 4.9 10*3/uL (ref 1.7–7.7)

## 2013-05-27 LAB — PROTIME-INR
INR: 0.92 (ref 0.00–1.49)
Prothrombin Time: 12.2 seconds (ref 11.6–15.2)

## 2013-05-27 LAB — COMPREHENSIVE METABOLIC PANEL
ALK PHOS: 120 U/L — AB (ref 39–117)
ALT: 14 U/L (ref 0–35)
AST: 16 U/L (ref 0–37)
Albumin: 4.5 g/dL (ref 3.5–5.2)
BILIRUBIN TOTAL: 0.4 mg/dL (ref 0.3–1.2)
BUN: 10 mg/dL (ref 6–23)
CHLORIDE: 94 meq/L — AB (ref 96–112)
CO2: 24 mEq/L (ref 19–32)
CREATININE: 0.73 mg/dL (ref 0.50–1.10)
Calcium: 9.7 mg/dL (ref 8.4–10.5)
GFR calc non Af Amer: >90 mL/min (ref >90)
GLUCOSE: 353 mg/dL — AB (ref 70–99)
POTASSIUM: 3.7 meq/L (ref 3.7–5.3)
Sodium: 135 mEq/L — ABNORMAL LOW (ref 137–147)
TOTAL PROTEIN: 8.8 g/dL — AB (ref 6.0–8.3)

## 2013-05-27 LAB — CBC
HCT: 44.5 % (ref 36.0–46.0)
HEMOGLOBIN: 16.1 g/dL — AB (ref 12.0–15.0)
MCH: 30.7 pg (ref 26.0–34.0)
MCHC: 36.2 g/dL — AB (ref 30.0–36.0)
MCV: 84.9 fL (ref 78.0–100.0)
Platelets: 259 10*3/uL (ref 150–400)
RBC: 5.24 MIL/uL — AB (ref 3.87–5.11)
RDW: 11.7 % (ref 11.5–15.5)
WBC: 8.3 10*3/uL (ref 4.0–10.5)

## 2013-05-27 LAB — CBG MONITORING, ED: Glucose-Capillary: 327 mg/dL — ABNORMAL HIGH (ref 70–99)

## 2013-05-27 LAB — GLUCOSE, CAPILLARY: Glucose-Capillary: 335 mg/dL — ABNORMAL HIGH (ref 70–99)

## 2013-05-27 LAB — I-STAT TROPONIN, ED: TROPONIN I, POC: 0 ng/mL (ref 0.00–0.08)

## 2013-05-27 MED ORDER — LABETALOL HCL 5 MG/ML IV SOLN
10.0000 mg | INTRAVENOUS | Status: DC | PRN
  Administered 2013-05-28 – 2013-05-30 (×8): 10 mg via INTRAVENOUS
  Filled 2013-05-27 (×6): qty 4

## 2013-05-27 MED ORDER — LABETALOL HCL 5 MG/ML IV SOLN
2.0000 mg/min | INTRAVENOUS | Status: DC
  Administered 2013-05-27: 2 mg/min via INTRAVENOUS
  Filled 2013-05-27: qty 100

## 2013-05-27 MED ORDER — HEPARIN SODIUM (PORCINE) 5000 UNIT/ML IJ SOLN
5000.0000 [IU] | Freq: Three times a day (TID) | INTRAMUSCULAR | Status: DC
  Administered 2013-05-28 – 2013-06-04 (×23): 5000 [IU] via SUBCUTANEOUS
  Filled 2013-05-27 (×26): qty 1

## 2013-05-27 MED ORDER — ASPIRIN 81 MG PO CHEW: 324.0000 mg | CHEWABLE_TABLET | Freq: Every day | ORAL | Status: DC

## 2013-05-27 MED ORDER — ASPIRIN 81 MG PO CHEW
324.0000 mg | CHEWABLE_TABLET | Freq: Every day | ORAL | Status: DC
  Administered 2013-05-28: 324 mg via ORAL
  Filled 2013-05-27 (×2): qty 4

## 2013-05-27 MED ORDER — SODIUM CHLORIDE 0.9 % IV SOLN
Freq: Once | INTRAVENOUS | Status: AC
  Administered 2013-05-27: 10 mL/h via INTRAVENOUS

## 2013-05-27 MED ORDER — ASPIRIN 81 MG PO CHEW
324.0000 mg | CHEWABLE_TABLET | Freq: Once | ORAL | Status: AC
  Administered 2013-05-27: 324 mg via ORAL
  Filled 2013-05-27: qty 4

## 2013-05-27 MED ORDER — INSULIN ASPART 100 UNIT/ML ~~LOC~~ SOLN
0.0000 [IU] | SUBCUTANEOUS | Status: DC
  Administered 2013-05-27: 7 [IU] via SUBCUTANEOUS
  Administered 2013-05-28: 5 [IU] via SUBCUTANEOUS
  Administered 2013-05-28: 2 [IU] via SUBCUTANEOUS
  Administered 2013-05-28 (×3): 7 [IU] via SUBCUTANEOUS
  Administered 2013-05-28: 5 [IU] via SUBCUTANEOUS

## 2013-05-27 MED ORDER — SODIUM CHLORIDE 0.9 % IV SOLN
INTRAVENOUS | Status: DC
  Administered 2013-05-28 (×2): via INTRAVENOUS

## 2013-05-27 NOTE — Progress Notes (Signed)
Discussed with radiologist. She has some concern for possible vasculitis. I suspect that this could also represent atherosclerotic changes secondary to htn and dm, but given her young age further investigation with LP may be reasonable.   1) Lumbar puncture with routine studies, vdrl.  2) If inflammatory CSF, then will pursue further vasculitic workup and consider treatment with solumedrol.   Roland Rack, MD [facility] [phone]  If 7pm- 7am, please page neurology on call at [phone].

## 2013-05-27 NOTE — ED Notes (Signed)
Pt. Transported to CT 

## 2013-05-27 NOTE — ED Notes (Signed)
Dr Sheldon at bedside  

## 2013-05-27 NOTE — ED Notes (Signed)
Per pt daughter sts pt last seen normal yesterday. Pt unable to tell me when she began to experience symptoms. Pt having slurred speech and right sided facial droop. Pt sts she feels fine. Pt has HTN and hasn't had her medication in a while per daughter. Pt daughter sts that her mother was taking her to school this am when she noticed that her speech was slurred, that she was talking about random things and her face was drooped.

## 2013-05-27 NOTE — H&P (Signed)
PULMONARY / CRITICAL CARE MEDICINE   Name: [name] MRN: [id number] DOB: September 08, 1960    ADMISSION DATE:  05/27/2013   PRIMARY SERVICE: PCCM  CHIEF COMPLAINT:  HTN w/ difficulty speaking   BRIEF PATIENT DESCRIPTION:  This is a 53 year old female w/ known h/o HTN, presented to the ER 3/10 w/ right sided facial droop and asphasia. Happened at some point during day 3/10 (but unknown exact time). Last seen nml state: 0830 3/10. Initial CT negative. BP 205/121. Seen by Neurology. PCCM asked to admit to NICU for stroke eval.   SIGNIFICANT EVENTS / STUDIES:  CT head 3/10: neg for bleed  MRI/MRA brain 3/10>>>Multiple foci of acute ischemia within the left middle cerebral artery territory. In addition, however punctate focus of acute ischemia within the left occipital lobe and right thalamus (both of which would be posterior circulation). All areas of ischemia have similar acuity. No hemorrhagic conversion. 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 Mild luminal irregularity of the intracranial vessels which can be seen with intracranial atherosclerosis though, may also be seen with vasculopathy. ECHO 3/10>>> Carotid dopplers 3/10>>> LP by neuro 3/10>>>  LINES / TUBES:   CULTURES:   ANTIBIOTICS:   HISTORY OF PRESENT ILLNESS:   This is a 53 year old female w/ known h/o HTN, presented to the ER 3/10 w/ difficulty speaking. Happened at some point during day 3/10 (but unknown exact time). Last seen nml state: 0830 3/10. Initial CT negative. BP 205/121. Seen by Neurology. PCCM asked to admit to NICU for stroke eval.   PAST MEDICAL HISTORY :  Past Medical History  Diagnosis Date  . Hypertension   . Diabetes mellitus    History reviewed. No pertinent past surgical history. Prior to Admission medications   Not on File   No Known Allergies  FAMILY HISTORY:  History reviewed. No pertinent family history. SOCIAL HISTORY:  reports that she has been smoking.  She does not have any  smokeless tobacco history on file. She reports that she does not drink alcohol or use illicit drugs.  REVIEW OF SYSTEMS:   SUBJECTIVE:  No acute distress  VITAL SIGNS: Temp:  [98.3 F (36.8 C)-99 F (37.2 C)] 99 F (37.2 C) (03/10 1907) Pulse Rate:  [97-115] 101 (03/10 2010) Resp:  [13-25] 20 (03/10 2010) BP: (187-240)/(92-132) 206/111 mmHg (03/10 2010) SpO2:  [96 %-99 %] 99 % (03/10 2010) Weight:  [73.539 kg (162 lb 2 oz)] 73.539 kg (162 lb 2 oz) (03/10 1721) HEMODYNAMICS:   VENTILATOR SETTINGS:   INTAKE / OUTPUT: Intake/Output   None     PHYSICAL EXAMINATION: General:  Awake, follows commands. No acute distress.  Neuro:  Awake, alert, oriented X 1 (self only). Good and equal st equal b upper and lowers. Right sided facial droop  HEENT:  [place], no JVD  Cardiovascular:  rrr Lungs:  Clear  Abdomen:  Soft, non tender  Musculoskeletal:  Intact  Skin:  Intact   LABS:  CBC  Recent Labs Lab 05/27/13 1724  WBC 8.3  HGB 16.1*  HCT 44.5  PLT 259   Coag's  Recent Labs Lab 05/27/13 1724  APTT 26  INR 0.92   BMET  Recent Labs Lab 05/27/13 1724  NA 135*  K 3.7  CL 94*  CO2 24  BUN 10  CREATININE 0.73  GLUCOSE 353*   Electrolytes  Recent Labs Lab 05/27/13 1724  CALCIUM 9.7   Sepsis Markers No results found for this basename: LATICACIDVEN, PROCALCITON, O2SATVEN,  in the  last 168 hours ABG No results found for this basename: PHART, PCO2ART, PO2ART,  in the last 168 hours Liver Enzymes  Recent Labs Lab 05/27/13 1724  AST 16  ALT 14  ALKPHOS 120*  BILITOT 0.4  ALBUMIN 4.5   Cardiac Enzymes No results found for this basename: TROPONINI, PROBNP,  in the last 168 hours Glucose  Recent Labs Lab 05/27/13 1855  GLUCAP 327*    Imaging Ct Head (brain) Wo Contrast  05/27/2013   CLINICAL DATA Slurred speech, right facial droop  EXAM CT HEAD WITHOUT CONTRAST  TECHNIQUE Contiguous axial images were obtained from the base of the skull through the  vertex without intravenous contrast.  COMPARISON CT HEAD W/O CM dated 10/04/2009; CT HEAD W/O CM dated 02/02/2007; CT HEAD W/O CM dated 11/15/2006  FINDINGS There is no evidence of mass effect, midline shift, or extra-axial fluid collections. There is no evidence of a space-occupying lesion or intracranial hemorrhage. There is no evidence of a cortical-based area of acute infarction. There is periventricular white matter low attenuation likely secondary to microangiopathy.  The ventricles and sulci are appropriate for the patient's age. The basal cisterns are patent.  Visualized portions of the orbits are unremarkable. The visualized portions of the paranasal sinuses and mastoid air cells are unremarkable.  The osseous structures are unremarkable.  IMPRESSION No acute intracranial pathology.  SIGNATURE  Electronically Signed   By: Kathreen Devoid   On: 05/27/2013 18:19     CXR:   ASSESSMENT / PLAN:  PULMONARY A: No acute  P:   Trend pulse ox O2 as needed CXR PRN  CARDIOVASCULAR A: HTN-->progressive.  P:  For now no rx unless SBP >220 or DBP >110 Tele  RENAL A:  No acute  P:   IVF/ maintain euvolemia  F/u chemistry   GASTROINTESTINAL A:  No acute  P:   NPO for now  HEMATOLOGIC A:  prob mild hemoconcentration  P:  F/u CBC [place] heparin   INFECTIOUS A:  No acute  P:   Trend fever curve  ENDOCRINE A:  DM w/ hyperglycemia  P:   ssi   NEUROLOGIC A:  Right sided facial droop and aphasia. Neuro suspects cortical infarct. Also on diff dx: PRES, hypertensive encephalopathy P:   HgbA1c, fasting lipid panel  MRI, MRA of the brain without contrast  Frequent neuro checks  Echocardiogram  Carotid dopplers  Prophylactic therapy-Antiplatelet med: Aspirin - dose 325mg (the patient may be a candidate for Socrates(aspirin versus ticagrelor) trial, research coordinator will discuss with her.  Risk factor modification  Telemetry monitoring  PT consult, OT consult, Speech consult   Progressive hypertension, would not treat unless higher than 220/120.     TODAY'S SUMMARY:  Will admit to ICU w/ work up per neuro. Awaiting MRI results. This will determine how aggressively we rx HTN.   Pulmonary and [place] Pager: [phone]  05/27/2013, 8:43 PM  Reviewed above, examined pt.  She has malignant HTN with acute neuro findings.  MRI shows ischemia Lt MCA, Lt occiput, and Rt thalmus as well as PRES.  Also concern for possible vasculitis (less likely).  Appreciate help from neurology.  F/u LP.  BP goal currently < 220/120 per neurology.  CC time 40 minutes.  Chesley Mires, MD [place] [facility] 05/27/2013 11:45 PM Pager:  [phone] After 3pm call: [phone]

## 2013-05-27 NOTE — ED Provider Notes (Signed)
CSN: [id number]     Arrival date & time 05/27/13  1708 History   First MD Initiated Contact with Patient 05/27/13 1719     Chief Complaint  Patient presents with  . Cerebrovascular Accident     (Consider location/radiation/quality/duration/timing/severity/associated sxs/prior Treatment) Patient is a 53 y.o. female presenting with Acute Neurological Problem.  Cerebrovascular Accident   Level 5 caveat due to confusion Pt brought to the ED by daughter for evaluation of ?slurred speech, confusion and facial droop. Last seen normal is unclear, the daughter reports the patient came into her bedroom around 8:15am prior to going to work, but daughter unsure of symptoms then. Pt works alone and was not around other people until she returned home around 2:30pm to take the daughter to work at which time the daughter felt like her speech was slow and slurred, difficulty getting words out and having a R sided facial droop. After class, around 5pm daughter states she found the patient wandering around the school, seemed to be confused. Pt denies any specific complaints, able to respond to yes/no and simple questions but does not elaborate.   Past Medical History  Diagnosis Date  . Hypertension   . Diabetes mellitus    History reviewed. No pertinent past surgical history. History reviewed. No pertinent family history. History  Substance Use Topics  . Smoking status: Current Every Day Smoker  . Smokeless tobacco: Not on file  . Alcohol Use: No   OB History   Grav Para Term Preterm Abortions TAB SAB Ect Mult Living                 Review of Systems Unable to assess due to mental status.      Allergies  Review of patient's allergies indicates no known allergies.  Home Medications   Current Outpatient Rx  Name  Route  Sig  Dispense  Refill  . insulin aspart (NOVOLOG) 100 UNIT/ML injection   Subcutaneous   Inject 20 Units into the skin daily after breakfast.         . insulin  glargine (LANTUS) 100 UNIT/ML injection   Subcutaneous   Inject 30 Units into the skin at bedtime.         Marland Kitchen lisinopril (PRINIVIL,ZESTRIL) 20 MG tablet   Oral   Take 20 mg by mouth daily.         . metoprolol (LOPRESSOR) 50 MG tablet   Oral   Take 50 mg by mouth daily.         . risperiDONE (RISPERDAL) 0.5 MG tablet   Oral   Take 0.5 mg by mouth daily.          BP 220/125  Pulse 110  Temp(Src) 98.3 F (36.8 C)  Resp 18  Wt 162 lb 2 oz (73.539 kg)  SpO2 98% Physical Exam  Nursing note and vitals reviewed. Constitutional: She is oriented to person, place, and time. She appears well-developed and well-nourished.  HENT:  Head: Normocephalic and atraumatic.  Eyes: EOM are normal. Pupils are equal, round, and reactive to light.  Neck: Normal range of motion. Neck supple.  Cardiovascular: Normal rate, normal heart sounds and intact distal pulses.   Pulmonary/Chest: Effort normal and breath sounds normal.  Abdominal: Bowel sounds are normal. She exhibits no distension. There is no tenderness.  Musculoskeletal: Normal range of motion. She exhibits no edema and no tenderness.  Neurological: She is alert and oriented to person, place, and time. She has normal strength and normal reflexes. A  cranial nerve deficit (R facial droop with forehead sparing) is present. No sensory deficit. She displays a negative Romberg sign. Coordination and gait normal.  Mild dysarthria on screening neuro exam, full NIHSS pending  Skin: Skin is warm and dry. No rash noted.  Psychiatric: She has a normal mood and affect.    ED Course  Procedures (including critical care time)  CRITICAL CARE Performed by: Truddie Hidden. Total critical care time: 45 Critical care time was exclusive of separately billable procedures and treating other patients. Critical care was necessary to treat or prevent imminent or life-threatening deterioration. Critical care was time spent personally by me on the  following activities: development of treatment plan with patient and/or surrogate as well as nursing, discussions with consultants, evaluation of patient's response to treatment, examination of patient, obtaining history from patient or surrogate, ordering and performing treatments and interventions, ordering and review of laboratory studies, ordering and review of radiographic studies, pulse oximetry and re-evaluation of patient's condition.   Labs Review Labs Reviewed  CBC - Abnormal; Notable for the following:    RBC 5.24 (*)    Hemoglobin 16.1 (*)    MCHC 36.2 (*)    All other components within normal limits  COMPREHENSIVE METABOLIC PANEL - Abnormal; Notable for the following:    Sodium 135 (*)    Chloride 94 (*)    Glucose, Bld 353 (*)    Total Protein 8.8 (*)    Alkaline Phosphatase 120 (*)    All other components within normal limits  GLUCOSE, CAPILLARY - Abnormal; Notable for the following:    Glucose-Capillary 335 (*)    All other components within normal limits  CBG MONITORING, ED - Abnormal; Notable for the following:    Glucose-Capillary 327 (*)    All other components within normal limits  MRSA PCR SCREENING  CSF CULTURE  PROTIME-INR  APTT  DIFFERENTIAL  HEMOGLOBIN A1C  LIPID PANEL  CBC  CSF CELL COUNT WITH DIFFERENTIAL  PROTEIN AND GLUCOSE, CSF  VDRL, CSF  I-STAT TROPOININ, ED   Imaging Review Dg Chest 2 View  05/27/2013   CLINICAL DATA Stroke symptoms, difficulty with speech.  EXAM CHEST  2 VIEW  COMPARISON 11/15/2006  FINDINGS Mild aortic tortuosity. Normal heart size. No confluent airspace opacity. No pleural effusion or pneumothorax. Mild multilevel degenerative change without acute osseous finding.  IMPRESSION No radiographic evidence of an acute cardiopulmonary process.  SIGNATURE  Electronically Signed   By: Carlos Levering M.D.   On: 05/27/2013 22:33   Ct Head (brain) Wo Contrast  05/27/2013   CLINICAL DATA Slurred speech, right facial droop  EXAM CT  HEAD WITHOUT CONTRAST  TECHNIQUE Contiguous axial images were obtained from the base of the skull through the vertex without intravenous contrast.  COMPARISON CT HEAD W/O CM dated 10/04/2009; CT HEAD W/O CM dated 02/02/2007; CT HEAD W/O CM dated 11/15/2006  FINDINGS There is no evidence of mass effect, midline shift, or extra-axial fluid collections. There is no evidence of a space-occupying lesion or intracranial hemorrhage. There is no evidence of a cortical-based area of acute infarction. There is periventricular white matter low attenuation likely secondary to microangiopathy.  The ventricles and sulci are appropriate for the patient's age. The basal cisterns are patent.  Visualized portions of the orbits are unremarkable. The visualized portions of the paranasal sinuses and mastoid air cells are unremarkable.  The osseous structures are unremarkable.  IMPRESSION No acute intracranial pathology.  SIGNATURE  Electronically Signed   By: Elbert Ewings  Patel   On: 05/27/2013 18:19     EKG Interpretation   Date/Time:  Tuesday May 27 2013 18:04:34 EDT Ventricular Rate:  108 PR Interval:  131 QRS Duration: 69 QT Interval:  344 QTC Calculation: 461 R Axis:   32 Text Interpretation:  Sinus tachycardia Consider right atrial enlargement  Consider left ventricular hypertrophy Since last tracing Rate faster  Confirmed by SHELDON  MD, Juanda Crumble [id number]) on 05/27/2013 6:19:15 PM      MDM   Final diagnoses:  Acute CVA (cerebrovascular accident)  Hypertensive emergency  Hyperglycemia    Pt with symptoms concerning for stroke however not a candidate for tPA or Code Stroke due to unclear time of onset.   CT neg, pt markedly hypertensive. Initially planned for labetalol drip, but advised by neuro to allow permissive HTN until MRI can be done. Pt to be admitted to ICU for further eval. Awake and alert and feeling better after MRI and prior to going upstairs.    Charles B. Karle Starch, MD 05/28/13 4132

## 2013-05-27 NOTE — Consult Note (Signed)
Neurology Consultation Reason for Consult: Aphasia Referring Physician: Karle Starch, C  CC: Difficulty speaking  History is obtained from:Patient  HPI: [name] is a 53 y.o. female with a history of hypertension and diabetes who presents with difficulty speaking that started at some point today. She did not notice it until after work, when she was speaking with her daughter. She did not speak to anyone at work today. The last time that she spoke to someone was when she was leaving for work around 8:30 AM   LKW: 8:30 am tpa given?: no, outside of window NIHSS: 3   ROS: A 14 point ROS was performed and is negative except as noted in the HPI.  Past Medical History  Diagnosis Date  . Hypertension   . Diabetes mellitus     Family History: No history of similar  Social History: Tob: Previous smoker  Exam: Current vital signs: BP 205/121  Pulse 115  Temp(Src) 99 F (37.2 C)  Resp 14  Wt 73.539 kg (162 lb 2 oz)  SpO2 99% Vital signs in last 24 hours: Temp:  [98.3 F (36.8 C)-99 F (37.2 C)] 99 F (37.2 C) (03/10 1907) Pulse Rate:  [110-115] 115 (03/10 1941) Resp:  [13-25] 14 (03/10 1941) BP: (187-240)/(101-132) 205/121 mmHg (03/10 1941) SpO2:  [98 %-99 %] 99 % (03/10 1941) Weight:  [73.539 kg (162 lb 2 oz)] 73.539 kg (162 lb 2 oz) (03/10 1721)  General: Bed, NAD CV: Regular rate and rhythm Mental Status: Patient is awake, alert, oriented to person, place, month, year, and situation. Immediate and remote memory are intact. Patient is able to give a clear and coherent history. No signs of neglect She understands all that aside, and is able to follow commands quickly. She has significant difficulty with expressive aphasia including difficulty with naming. Cranial Nerves: II: Visual Fields are full. Pupils are equal, round, and reactive to light.  Discs are difficult to visualize. III,IV, VI: EOMI without ptosis or diploplia.  V: Facial sensation is symmetric to  temperature VII: Facial movement is notable for right facial droop VIII: hearing is intact to voice X: Uvula elevates symmetrically XI: Shoulder shrug is symmetric. XII: tongue is midline without atrophy or fasciculations.  Motor: Tone is normal. Bulk is normal. 5/5 strength was present in all four extremities.  Sensory: Sensation is symmetric to light touch and temperature in the arms and legs. Deep Tendon Reflexes: 2+ and symmetric in the biceps and patellae.  Plantars: Toes are downgoing bilaterally.  Cerebellar: FNF and HKS are intact bilaterally Gait: Not assessed due to acute nature of evaluation and multiple medical monitors in ED setting.\    I have reviewed labs in epic and the results pertinent to this consultation are: Glucose-353  I have reviewed the images obtained: CT head-no hemorrhage  Impression: 53 year old female with a history of hypertension and diabetes who presents with severe hypertension and right-sided facial droop and aphasia. I suspect that she has had a small cortical infarct. She will need to be admitted for a stroke workup. She was not taking any antiplatelet medicines prior to admission.  Recommendations: 1. HgbA1c, fasting lipid panel 2. MRI, MRA  of the brain without contrast 3. Frequent neuro checks 4. Echocardiogram 5. Carotid dopplers 6. Prophylactic therapy-Antiplatelet med: Aspirin - dose 325mg (the patient may be a candidate for Socrates(aspirin versus ticagrelor) trial, research coordinator will discuss with her. 7. Risk factor modification 8. Telemetry monitoring 9. PT consult, OT consult, Speech consult 10. Progressive hypertension, would  not treat unless higher than 220/120.  11. If MRI does not confirm stroke I would favor being more aggressive with her blood pressure at that time.   Roland Rack, MD [facility] [phone]  If 7pm- 7am, please page neurology on call at [phone].

## 2013-05-28 DIAGNOSIS — I6350 Cerebral infarction due to unspecified occlusion or stenosis of unspecified cerebral artery: Secondary | ICD-10-CM

## 2013-05-28 DIAGNOSIS — I6783 Posterior reversible encephalopathy syndrome: Secondary | ICD-10-CM | POA: Diagnosis present

## 2013-05-28 DIAGNOSIS — I6789 Other cerebrovascular disease: Secondary | ICD-10-CM

## 2013-05-28 DIAGNOSIS — I10 Essential (primary) hypertension: Secondary | ICD-10-CM | POA: Diagnosis present

## 2013-05-28 LAB — URINALYSIS, ROUTINE W REFLEX MICROSCOPIC
Bilirubin Urine: NEGATIVE
Glucose, UA: >1000 mg/dL — AB
Hgb urine dipstick: NEGATIVE
Ketones, ur: 15 mg/dL — AB
Leukocytes, UA: NEGATIVE
Nitrite: NEGATIVE
PROTEIN: NEGATIVE mg/dL
Specific Gravity, Urine: 1.037 — ABNORMAL HIGH (ref 1.005–1.030)
Urobilinogen, UA: 0.2 mg/dL (ref 0.0–1.0)
pH: 5 (ref 5.0–8.0)

## 2013-05-28 LAB — URINE MICROSCOPIC-ADD ON

## 2013-05-28 LAB — LIPID PANEL
CHOL/HDL RATIO: 4.8 ratio
CHOLESTEROL: 201 mg/dL — AB (ref 0–200)
HDL: 42 mg/dL (ref >39)
LDL Cholesterol: 130 mg/dL — ABNORMAL HIGH (ref 0–99)
Triglycerides: 145 mg/dL (ref <150)
VLDL: 29 mg/dL (ref 0–40)

## 2013-05-28 LAB — CBC
HCT: 42.5 % (ref 36.0–46.0)
Hemoglobin: 15.1 g/dL — ABNORMAL HIGH (ref 12.0–15.0)
MCH: 30.2 pg (ref 26.0–34.0)
MCHC: 35.5 g/dL (ref 30.0–36.0)
MCV: 85 fL (ref 78.0–100.0)
PLATELETS: 267 10*3/uL (ref 150–400)
RBC: 5 MIL/uL (ref 3.87–5.11)
RDW: 11.6 % (ref 11.5–15.5)
WBC: 8.3 10*3/uL (ref 4.0–10.5)

## 2013-05-28 LAB — CSF CELL COUNT WITH DIFFERENTIAL
RBC COUNT CSF: 1 /mm3 — AB
Tube #: 1
WBC CSF: 0 /mm3 (ref 0–5)

## 2013-05-28 LAB — HIV ANTIBODY (ROUTINE TESTING W REFLEX): HIV: NONREACTIVE

## 2013-05-28 LAB — GLUCOSE, CAPILLARY
GLUCOSE-CAPILLARY: 280 mg/dL — AB (ref 70–99)
GLUCOSE-CAPILLARY: 305 mg/dL — AB (ref 70–99)
GLUCOSE-CAPILLARY: 340 mg/dL — AB (ref 70–99)
Glucose-Capillary: 222 mg/dL — ABNORMAL HIGH (ref 70–99)
Glucose-Capillary: 228 mg/dL — ABNORMAL HIGH (ref 70–99)
Glucose-Capillary: 353 mg/dL — ABNORMAL HIGH (ref 70–99)
Glucose-Capillary: 278 mg/dL — ABNORMAL HIGH (ref 70–99)

## 2013-05-28 LAB — RPR: RPR Ser Ql: NONREACTIVE

## 2013-05-28 LAB — PROTEIN AND GLUCOSE, CSF
GLUCOSE CSF: 189 mg/dL — AB (ref 43–76)
Total  Protein, CSF: 33 mg/dL (ref 15–45)

## 2013-05-28 LAB — SEDIMENTATION RATE: SED RATE: 25 mm/h — AB (ref 0–22)

## 2013-05-28 LAB — ANTITHROMBIN III: AntiThromb III Func: 125 % — ABNORMAL HIGH (ref 75–120)

## 2013-05-28 MED ORDER — INSULIN DETEMIR 100 UNIT/ML ~~LOC~~ SOLN
18.0000 [IU] | Freq: Every day | SUBCUTANEOUS | Status: DC
  Administered 2013-05-29: 18 [IU] via SUBCUTANEOUS
  Filled 2013-05-28 (×3): qty 0.18

## 2013-05-28 MED ORDER — INSULIN ASPART 100 UNIT/ML ~~LOC~~ SOLN
0.0000 [IU] | Freq: Three times a day (TID) | SUBCUTANEOUS | Status: DC
  Administered 2013-05-29: 8 [IU] via SUBCUTANEOUS
  Administered 2013-05-29: 5 [IU] via SUBCUTANEOUS

## 2013-05-28 MED ORDER — PNEUMOCOCCAL VAC POLYVALENT 25 MCG/0.5ML IJ INJ
0.5000 mL | INJECTION | INTRAMUSCULAR | Status: AC
  Administered 2013-05-30: 0.5 mL via INTRAMUSCULAR
  Filled 2013-05-28 (×2): qty 0.5

## 2013-05-28 MED ORDER — SIMVASTATIN 20 MG PO TABS
20.0000 mg | ORAL_TABLET | Freq: Every day | ORAL | Status: DC
  Administered 2013-05-28: 20 mg via ORAL
  Filled 2013-05-28 (×4): qty 1

## 2013-05-28 MED ORDER — INFLUENZA VAC SPLIT QUAD 0.5 ML IM SUSP
0.5000 mL | INTRAMUSCULAR | Status: AC
  Administered 2013-05-30: 0.5 mL via INTRAMUSCULAR
  Filled 2013-05-28 (×2): qty 0.5

## 2013-05-28 MED ORDER — INSULIN ASPART 100 UNIT/ML ~~LOC~~ SOLN
0.0000 [IU] | Freq: Every day | SUBCUTANEOUS | Status: DC
  Administered 2013-05-28: 3 [IU] via SUBCUTANEOUS

## 2013-05-28 NOTE — Progress Notes (Signed)
Dr. Wynelle Cleveland notified via text page of persistent elevated B/P 191/104. Administered prn Labetalol 10 mg time three this shift. Continued elevated CBG noted 1600 CBG 340. Patient covered per sliding scale coverage.

## 2013-05-28 NOTE — Progress Notes (Signed)
STAFF NOTE: I, Dr Ann Lions have personally reviewed patient's available data, including medical history, events of note, physical examination and test results as part of my evaluation. I have discussed with resident/NP and other care providers such as pharmacist, RN and RRT.  In addition,  I personally evaluated patient and elicited key findings of *acute CVA, BP improved with prn labetalol. Stable since admit. Tx to floor -- neuro tele.Marland Kitchen  Rest per NP/medical resident whose note is outlined above and that I agree with     Dr. Brand Males, M.D., [facility].C.P Pulmonary and Critical Care Medicine Staff Physician [facility] Pager: [phone], If no answer or between  15:00h - 7:00h: call [phone]  05/28/2013 10:49 PM

## 2013-05-28 NOTE — Progress Notes (Signed)
PT Cancellation Note  Patient Details Name: [name] MRN: [id number] DOB: 03/03/1961   Cancelled Treatment:    Reason Eval/Treat Not Completed: Patient not medically ready. Pt currently on bedrest with ? plans for lumbar puncture. Will await incr activity orders prior to proceeding with evaluation.   Lynda Capistran 05/28/2013, 8:28 AM Pager [phone]

## 2013-05-28 NOTE — Progress Notes (Signed)
PULMONARY / CRITICAL CARE MEDICINE   Name: [name] MRN: [id number] DOB: 10/23/1960    ADMISSION DATE:  05/27/2013   PRIMARY SERVICE: PCCM  CHIEF COMPLAINT:  HTN w/ difficulty speaking   BRIEF PATIENT DESCRIPTION:  This is a 53 year old female w/ known h/o HTN, presented to the ER 3/10 w/ right sided facial droop and asphasia. Happened at some point during day 3/10 (but unknown exact time). Last seen nml state: 0830 3/10. Initial CT negative. BP 205/121. Seen by Neurology. PCCM asked to admit to NICU for stroke eval.   SIGNIFICANT EVENTS / STUDIES:  CT head 3/10: neg for bleed  MRI/MRA brain 3/10>>>Multiple foci of acute ischemia within the left middle cerebral artery territory. In addition, however punctate focus of acute ischemia within the left occipital lobe and right thalamus (both of which would be posterior circulation). All areas of ischemia have similar acuity. No hemorrhagic conversion. 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 Mild luminal irregularity of the intracranial vessels which can be seen with intracranial atherosclerosis though, may also be seen with vasculopathy. ECHO 3/10>>>Left ventricle: The cavity size was normal. Systolicfunction was normal. The estimated ejection fraction was in the range of 55% to 60%. Wall motion was normal Carotid dopplers 3/10>>> LP by neuro 3/10>>> neg  TEE 3/11>>>  LINES / TUBES:   CULTURES:   ANTIBIOTICS:  SUBJECTIVE:  No acute distress  VITAL SIGNS: Temp:  [98.4 F (36.9 C)-99.3 F (37.4 C)] 99.3 F (37.4 C) (03/11 1600) Pulse Rate:  [86-154] 103 (03/11 2100) Resp:  [13-31] 16 (03/11 2100) BP: (128-226)/(84-136) 215/102 mmHg (03/11 2100) SpO2:  [89 %-100 %] 100 % (03/11 2100) HEMODYNAMICS:   VENTILATOR SETTINGS:   INTAKE / OUTPUT: Intake/Output     03/11 0701 - 03/12 0700   I.V. (mL/kg) 1050 (14.5)   Total Intake(mL/kg) 1050 (14.5)   Urine (mL/kg/hr) 1050 (0.9)   Total Output 1050   Net 0        Urine Occurrence 1 x     PHYSICAL EXAMINATION: General:  Awake, follows commands. No acute distress.  Neuro:  Awake, alert.  Good and equal st equal b upper and lowers. Right sided facial droop. Unable to fully assess orientation d/t expressive aphasia  HEENT:  [place], no JVD, right droop   Cardiovascular:  rrr Lungs:  Clear  Abdomen:  Soft, non tender  Musculoskeletal:  Intact  Skin:  Intact   LABS:  CBC  Recent Labs Lab 05/27/13 1724 05/27/13 2332  WBC 8.3 8.3  HGB 16.1* 15.1*  HCT 44.5 42.5  PLT 259 267   Coag's  Recent Labs Lab 05/27/13 1724  APTT 26  INR 0.92   BMET  Recent Labs Lab 05/27/13 1724  NA 135*  K 3.7  CL 94*  CO2 24  BUN 10  CREATININE 0.73  GLUCOSE 353*   Electrolytes  Recent Labs Lab 05/27/13 1724  CALCIUM 9.7   Sepsis Markers No results found for this basename: LATICACIDVEN, PROCALCITON, O2SATVEN,  in the last 168 hours ABG No results found for this basename: PHART, PCO2ART, PO2ART,  in the last 168 hours Liver Enzymes  Recent Labs Lab 05/27/13 1724  AST 16  ALT 14  ALKPHOS 120*  BILITOT 0.4  ALBUMIN 4.5   Cardiac Enzymes No results found for this basename: TROPONINI, PROBNP,  in the last 168 hours Glucose  Recent Labs Lab 05/28/13 0022 05/28/13 0426 05/28/13 0851 05/28/13 1243 05/28/13 1521 05/28/13 2018  GLUCAP 280* 222* 228* 305* 340*  353*    Imaging Dg Chest 2 View  05/27/2013   CLINICAL DATA Stroke symptoms, difficulty with speech.  EXAM CHEST  2 VIEW  COMPARISON 11/15/2006  FINDINGS Mild aortic tortuosity. Normal heart size. No confluent airspace opacity. No pleural effusion or pneumothorax. Mild multilevel degenerative change without acute osseous finding.  IMPRESSION No radiographic evidence of an acute cardiopulmonary process.  SIGNATURE  Electronically Signed   By: Carlos Levering M.D.   On: 05/27/2013 22:33   Ct Head (brain) Wo Contrast  05/27/2013   CLINICAL DATA Slurred speech, right facial droop   EXAM CT HEAD WITHOUT CONTRAST  TECHNIQUE Contiguous axial images were obtained from the base of the skull through the vertex without intravenous contrast.  COMPARISON CT HEAD W/O CM dated 10/04/2009; CT HEAD W/O CM dated 02/02/2007; CT HEAD W/O CM dated 11/15/2006  FINDINGS There is no evidence of mass effect, midline shift, or extra-axial fluid collections. There is no evidence of a space-occupying lesion or intracranial hemorrhage. There is no evidence of a cortical-based area of acute infarction. There is periventricular white matter low attenuation likely secondary to microangiopathy.  The ventricles and sulci are appropriate for the patient's age. The basal cisterns are patent.  Visualized portions of the orbits are unremarkable. The visualized portions of the paranasal sinuses and mastoid air cells are unremarkable.  The osseous structures are unremarkable.  IMPRESSION No acute intracranial pathology.  SIGNATURE  Electronically Signed   By: Kathreen Devoid   On: 05/27/2013 18:19   Mr Angiogram Head Wo Contrast  05/27/2013   CLINICAL DATA Right facial droop, slurred speech.  EXAM MRI HEAD WITHOUT CONTRAST  MRA HEAD WITHOUT CONTRAST  TECHNIQUE Multiplanar, multiecho pulse sequences of the brain and surrounding structures were obtained without intravenous contrast. Angiographic images of the head were obtained using MRA technique without contrast.  COMPARISON CT HEAD W/O CM dated 05/27/2013; CT HEAD W/O CM dated 10/04/2009  FINDINGS MRI HEAD FINDINGS  Multiple foci of reduced diffusion in the left frontal temporal lobe, including 16 x 14 mm lesion and left corona radiata, 20 x 10 mm lesion in the high left frontal lobe at the gray-white matter junction. In addition, punctate focus of reduced diffusion within the right medial thalamus, and left occipital lobe. All foci show low ADC values in very mild FLAIR hyperintense signal, all appearing of similar age. No susceptibility artifact to suggest hemorrhage.  The  ventricles and sulci are overall normal for patient's age. Scattered patchy supratentorial white matter T2 hyperintensities are far greater than expected for age, many which appear to radiate from the periventricular margin. In addition, symmetric homogenous bright T2 signal within the bilateral lenticulostriate nuclei, axial 13 /22. Patchy bright signal within the pons. However there is confluent periventricular white matter FLAIR T2 hyperintensity about the temporal horns and occipital horns. No midline shift.  No abnormal extra-axial fluid collections. Ocular globes and orbital contents are nonsuspicious though not tailored for evaluation. No abnormal sellar expansion or cerebellar tonsillar ectopia. The visualized paranasal sinuses and mastoid air cells are appear well aerated.  MRA HEAD FINDINGS  Anterior circulation: Normal flow related enhancement of the included cervical, petrous, cavernous and supra clinoid internal carotid arteries. Patent anterior communicating artery. Normal flow related enhancement of the anterior cerebral arteries, including more distal segments. Abrupt cut off of the left M1 segment, with absent distal left middle cerebral artery vessels. Patent right middle cerebral artery and distal segments, however there is somewhat distal segment luminal irregularity.  Posterior  circulation: Left Vertebral artery is dominant. Basilar artery is patent, with normal flow related enhancement of the main branch vessels. Normal flow related enhancement of the posterior cerebral arteries centrally, however there is relative paucity of right P3 branches compared to the left. Mildly irregular P2 segments, to lesser extent left P3 segment.  No hemodynamically significant stenosis, aneurysm within the anterior nor posterior circulation.  IMPRESSION MRI head: Multiple foci of acute ischemia within the left middle cerebral artery territory. In addition, however punctate focus of acute ischemia within the left  occipital lobe and right thalamus (both of which would be posterior circulation). All areas of ischemia have similar acuity. No hemorrhagic conversion.  Symmetric abnormal bright signal within the lenticulostriate nuclei (basal ganglia) in addition to abnormal temporoccipital periventricular T2 hyperintense signal. These findings may reflect sequelae of acute severe hypertension (PRES) but, it can also be seen with osmotic demyelination, autoimmune disorders such as scleroderma (which can have associated vasculopathy), less likely infectious encephalitis.  Moderate to severe white matter changes, in a distribution which can be seen with demyelination, with superimposed possible chronic small vessel ischemic disease though, are nonspecific.  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  Mild luminal irregularity of the intracranial vessels which can be seen with intracranial atherosclerosis though, may also be seen with vasculopathy.  Critical Value/emergent results were called by telephone at the time of interpretation on 05/27/2013 at 10:28 PM to Dr. Calvert Cantor , who verbally acknowledged these results.  SIGNATURE  Electronically Signed   By: Elon Alas   On: 05/27/2013 22:32   [name] Contrast  05/27/2013   CLINICAL DATA Right facial droop, slurred speech.  EXAM MRI HEAD WITHOUT CONTRAST  MRA HEAD WITHOUT CONTRAST  TECHNIQUE Multiplanar, multiecho pulse sequences of the brain and surrounding structures were obtained without intravenous contrast. Angiographic images of the head were obtained using MRA technique without contrast.  COMPARISON CT HEAD W/O CM dated 05/27/2013; CT HEAD W/O CM dated 10/04/2009  FINDINGS MRI HEAD FINDINGS  Multiple foci of reduced diffusion in the left frontal temporal lobe, including 16 x 14 mm lesion and left corona radiata, 20 x 10 mm lesion in the high left frontal lobe at the gray-white matter junction. In addition, punctate focus of reduced diffusion  within the right medial thalamus, and left occipital lobe. All foci show low ADC values in very mild FLAIR hyperintense signal, all appearing of similar age. No susceptibility artifact to suggest hemorrhage.  The ventricles and sulci are overall normal for patient's age. Scattered patchy supratentorial white matter T2 hyperintensities are far greater than expected for age, many which appear to radiate from the periventricular margin. In addition, symmetric homogenous bright T2 signal within the bilateral lenticulostriate nuclei, axial 13 /22. Patchy bright signal within the pons. However there is confluent periventricular white matter FLAIR T2 hyperintensity about the temporal horns and occipital horns. No midline shift.  No abnormal extra-axial fluid collections. Ocular globes and orbital contents are nonsuspicious though not tailored for evaluation. No abnormal sellar expansion or cerebellar tonsillar ectopia. The visualized paranasal sinuses and mastoid air cells are appear well aerated.  MRA HEAD FINDINGS  Anterior circulation: Normal flow related enhancement of the included cervical, petrous, cavernous and supra clinoid internal carotid arteries. Patent anterior communicating artery. Normal flow related enhancement of the anterior cerebral arteries, including more distal segments. Abrupt cut off of the left M1 segment, with absent distal left middle cerebral artery vessels. Patent right middle cerebral artery and distal  segments, however there is somewhat distal segment luminal irregularity.  Posterior circulation: Left Vertebral artery is dominant. Basilar artery is patent, with normal flow related enhancement of the main branch vessels. Normal flow related enhancement of the posterior cerebral arteries centrally, however there is relative paucity of right P3 branches compared to the left. Mildly irregular P2 segments, to lesser extent left P3 segment.  No hemodynamically significant stenosis, aneurysm within  the anterior nor posterior circulation.  IMPRESSION MRI head: Multiple foci of acute ischemia within the left middle cerebral artery territory. In addition, however punctate focus of acute ischemia within the left occipital lobe and right thalamus (both of which would be posterior circulation). All areas of ischemia have similar acuity. No hemorrhagic conversion.  Symmetric abnormal bright signal within the lenticulostriate nuclei (basal ganglia) in addition to abnormal temporoccipital periventricular T2 hyperintense signal. These findings may reflect sequelae of acute severe hypertension (PRES) but, it can also be seen with osmotic demyelination, autoimmune disorders such as scleroderma (which can have associated vasculopathy), less likely infectious encephalitis.  Moderate to severe white matter changes, in a distribution which can be seen with demyelination, with superimposed possible chronic small vessel ischemic disease though, are nonspecific.  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  Mild luminal irregularity of the intracranial vessels which can be seen with intracranial atherosclerosis though, may also be seen with vasculopathy.  Critical Value/emergent results were called by telephone at the time of interpretation on 05/27/2013 at 10:28 PM to Dr. Calvert Cantor , who verbally acknowledged these results.  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 Infarcts felt to be embolic secondary to unknown source P:   For TEE to r/o embolic source [id number] hypercoag panel sent by stroke team Carotid dopplers pending  Cont asa  Risk factor modification  PT consult, OT consult, Speech consult    HTN P:  For now no rx unless SBP >220 or DBP >110 (has PRN labetolol) Could consider adding  scheduled antihypertensives on 3/12 as will be >24 hrs s/p CVA.   prob mild hemoconcentration  P:  F/u CBC [place] heparin   DM w/ hyperglycemia  P:   Add levemir and cont mod resist as will be NPO after midnight    TODAY'S SUMMARY: Ready to move out of ICU. To Continue current stroke work up. Will defer to stroke team as to timing of increased BP management. Should be able to escalate BP management as of 3/12 as will be >24hrs after acute CVA.   05/28/2013, 10:25 PM

## 2013-05-28 NOTE — Progress Notes (Signed)
VASCULAR LAB PRELIMINARY  PRELIMINARY  PRELIMINARY  PRELIMINARY  Carotid duplex  completed.    Preliminary report:  Bilateral:  1-39% ICA stenosis.  Vertebral artery flow is antegrade.      Offie Pickron, RVT 05/28/2013, 12:47 PM

## 2013-05-28 NOTE — Progress Notes (Signed)
Stroke Team Progress Note  HISTORY [name] is a 53 y.o. female with a history of hypertension and diabetes who presents 05/27/2013 with difficulty speaking that started at some point today. She did not notice it until after work, when she was speaking with her daughter. She did not speak to anyone at work today. The last time that she spoke to someone was when she was leaving for work around 8:30 AM. Patient was not administerd TPA secondary to delay in arrival. She was considered for enrollment in [facility].  She was admitted to the ICU for further evaluation and treatment.  SUBJECTIVE Her daughter-in-law is at the bedside.  Overall she feels her condition is unchanged. She continues with expressive aphasia.  OBJECTIVE Most recent Vital Signs: Filed Vitals:   05/28/13 0630 05/28/13 0700 05/28/13 0800 05/28/13 0900  BP: 171/97 196/105 196/113 176/112  Pulse: 101 93 97 98  Temp:   98.7 F (37.1 C)   TempSrc:   Oral   Resp: 23 18 21 31   Height:      Weight:      SpO2: 98% 98% 97% 100%   CBG (last 3)   Recent Labs  05/28/13 0022 05/28/13 0426 05/28/13 0851  GLUCAP 280* 222* 228*    IV Fluid Intake:   . sodium chloride 75 mL/hr at 05/28/13 0021    MEDICATIONS  . aspirin  324 mg Oral Daily  . heparin  5,000 Units Subcutaneous 3 times per day  . [START ON 05/29/2013] influenza vac split quadrivalent PF  0.5 mL Intramuscular Tomorrow-1000  . insulin aspart  0-9 Units Subcutaneous 6 times per day  . [START ON 05/29/2013] pneumococcal 23 valent vaccine  0.5 mL Intramuscular Tomorrow-1000   PRN:  labetalol  Diet:  Carb Control thin liquids Activity:  Bedrest DVT Prophylaxis:  Heparin 5000 units sq tid   CLINICALLY SIGNIFICANT STUDIES Basic Metabolic Panel:   Recent Labs Lab 05/27/13 1724  NA 135*  K 3.7  CL 94*  CO2 24  GLUCOSE 353*  BUN 10  CREATININE 0.73  CALCIUM 9.7   Liver Function Tests:   Recent Labs Lab 05/27/13 1724  AST 16  ALT 14  ALKPHOS  120*  BILITOT 0.4  PROT 8.8*  ALBUMIN 4.5   CBC:   Recent Labs Lab 05/27/13 1724 05/27/13 2332  WBC 8.3 8.3  NEUTROABS 4.9  --   HGB 16.1* 15.1*  HCT 44.5 42.5  MCV 84.9 85.0  PLT 259 267   Coagulation:   Recent Labs Lab 05/27/13 1724  LABPROT 12.2  INR 0.92   Cardiac Enzymes: No results found for this basename: CKTOTAL, CKMB, CKMBINDEX, TROPONINI,  in the last 168 hours Urinalysis:   Recent Labs Lab 05/28/13 0842  COLORURINE YELLOW  LABSPEC 1.037*  PHURINE 5.0  GLUCOSEU >1000*  HGBUR NEGATIVE  BILIRUBINUR NEGATIVE  KETONESUR 15*  PROTEINUR NEGATIVE  UROBILINOGEN 0.2  NITRITE NEGATIVE  LEUKOCYTESUR NEGATIVE   Lipid Panel    Component Value Date/Time   CHOL 201* 05/28/2013 0300   TRIG 145 05/28/2013 0300   HDL 42 05/28/2013 0300   CHOLHDL 4.8 05/28/2013 0300   VLDL 29 05/28/2013 0300   LDLCALC 130* 05/28/2013 0300   HgbA1C  Lab Results  Component Value Date   HGBA1C 15.1* 02/24/2010    Urine Drug Screen:     Component Value Date/Time   LABOPIA NONE DETECTED 10/04/2009 0314   COCAINSCRNUR NONE DETECTED 10/04/2009 0314   LABBENZ NONE DETECTED 10/04/2009 0314  AMPHETMU NONE DETECTED 10/04/2009 0314   THCU NONE DETECTED 10/04/2009 0314   LABBARB  Value: NONE DETECTED        DRUG SCREEN FOR MEDICAL PURPOSES ONLY.  IF CONFIRMATION IS NEEDED FOR ANY PURPOSE, NOTIFY LAB WITHIN 5 DAYS.        LOWEST DETECTABLE LIMITS FOR URINE DRUG SCREEN Drug Class       Cutoff (ng/mL) Amphetamine      1000 Barbiturate      200 Benzodiazepine   191 Tricyclics       478 Opiates          300 Cocaine          300 THC              50 10/04/2009 0314    Alcohol Level: No results found for this basename: ETH,  in the last 168 hours   CT of the brain  05/27/2013   No acute intracranial pathology.    MRI of the brain  05/27/2013     Multiple foci of acute ischemia within the left middle cerebral artery territory. In addition, however punctate focus of acute ischemia within the left  occipital lobe and right thalamus (both of which would be posterior circulation). All areas of ischemia have similar acuity. No hemorrhagic conversion.  Symmetric abnormal bright signal within the lenticulostriate nuclei (basal ganglia) in addition to abnormal temporoccipital periventricular T2 hyperintense signal. These findings may reflect sequelae of acute severe hypertension (PRES) but, it can also be seen with osmotic demyelination, autoimmune disorders such as scleroderma (which can have associated vasculopathy), less likely infectious encephalitis.  Moderate to severe white matter changes, in a distribution which can be seen with demyelination, with superimposed possible chronic small vessel ischemic disease though, are nonspecific.    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  Mild luminal irregularity of the intracranial vessels which can be seen with intracranial atherosclerosis though, may also be seen with vasculopathy.    2D Echocardiogram    Carotid Doppler    CXR  05/27/2013    No radiographic evidence of an acute cardiopulmonary process.    EKG  sinus tachycardia. For complete results please see formal report.   Therapy Recommendations   Physical Exam   Middle aged  African american lady not in distress.Awake alert. Afebrile. Head is nontraumatic. Neck is supple without bruit. Hearing is normal. Cardiac exam no murmur or gallop. Lungs are clear to auscultation. Distal pulses are well felt. Neurological Exam : Awake alert moderate expressive aphasia with significant word hesitancy and nonfluent speech. Can speak words and short sentences. Able to comprehend one and two-step commands only. Unable to name or repeat. Extraocular movements are full range without nystagmus. Fundi were not visualized. Vision acuity seems adequate. Decreased blink to threat on the right compared to the left. Moderate right lower facial weakness. Tongue midline. No upper or  lower extremity drift. Finger-to-nose coordination seems accurate. Unable to comprehend needle coordination testing. Underlying lung sensory system testing. Plantars both downgoing. Gait was not tested. ASSESSMENT [name] is a 53 y.o. female presenting with difficulty speaking. 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  Infarcts felt to be embolic secondary to unknown source.  On no antithrombotics prior to admission. Now on aspirin 324 mg chewable daily for secondary stroke prevention. Patient with resultant expressive  and receptive aphasia, right field cut. Stroke work up underway.  Attempted to enroll in [facility]; technical difficulties precluded randomization.  LP done on arrival unrevealing.  Malignant hypertension Diabetes, HgbA1c pending, goal < 7.0 Hyperlipidemia, LDL 130, on no statin PTA, now on no statin, goal LDL < 70 for diabetics  Hospital day # 1  TREATMENT/PLAN  Continue aspirin for secondary stroke prevention.  F/u HgbA1c, 2D, carotid doppler  zocoro 20 mg daily added TEE to look for embolic source. Arranged with [facility] for tomorrow.  If positive for PFO (patent foramen ovale), check bilateral lower extremity venous dopplers to rule out DVT as possible source of stroke. (I have made patient NPO after midnight tonight). check Hypercoagulable panel (minus factor 5 leiden and beta-2-glycoprotein as these test for venous clots) and vasculitic labs (C3, C4, CH50, ANA, ESR) HIV and RPR Ok to transfer to the floor from neuro standpoint OOB, therapy evals  Burnetta Sabin, MSN, RN, ANVP-BC, AGPCNP-BC [name] Pager: [phone] 05/28/2013 10:12 AM  I have personally obtained a history, examined the patient, evaluated imaging results, and formulated the assessment and plan of care. I agree with the  above. Antony Contras, MD

## 2013-05-28 NOTE — Progress Notes (Signed)
Inpatient Diabetes Program Recommendations  AACE/ADA: New Consensus Statement on Inpatient Glycemic Control (2013)  Target Ranges:  Prepandial:   less than 140 mg/dL      Peak postprandial:   less than 180 mg/dL (1-2 hours)      Critically ill patients:  140 - 180 mg/dL   Reason for Visit: Results for FAELYN, SIGLER (MRN [id number]) as of 05/28/2013 16:07  Ref. Range 05/28/2013 00:22 05/28/2013 04:26 05/28/2013 08:51 05/28/2013 12:43 05/28/2013 15:21  Glucose-Capillary Latest Range: 70-99 mg/dL 280 (H) 222 (H) 228 (H) 305 (H) 340 (H)    Diabetes history: Type 2 diabetes-Note that A1C in 2011 was 15.1% Outpatient Diabetes medications: None Current orders for Inpatient glycemic control: Novolog sensitive q 4 hours.  Please consider adding Levemir 18 units daily and increase Novolog correction to moderate q 4 hours.  A1C pending for tomorrow morning.    Adah Perl, RN, BC-ADM Inpatient Diabetes Coordinator Pager [phone]

## 2013-05-28 NOTE — Progress Notes (Signed)
    [facility] has been requested to perform a transesophageal echocardiogram on 05/29/13 for CVA.  After careful review of history and examination, the risks and benefits of transesophageal echocardiogram have been explained including risks of esophageal damage, perforation (1:10,000 risk), bleeding, pharyngeal hematoma as well as other potential complications associated with conscious sedation including aspiration, arrhythmia, respiratory failure and death. Alternatives to treatment were discussed, questions were answered. Patient is willing to proceed.   Perry Mount, PA 05/28/2013 3:28 PM

## 2013-05-29 ENCOUNTER — Inpatient Hospital Stay (HOSPITAL_COMMUNITY): Payer: Medicaid Other

## 2013-05-29 ENCOUNTER — Encounter (HOSPITAL_COMMUNITY): Payer: Self-pay | Admitting: *Deleted

## 2013-05-29 ENCOUNTER — Encounter (HOSPITAL_COMMUNITY)
Admission: EM | Disposition: A | Discharge status: Home / Self Care | Payer: Medicaid Other | Source: Home / Self Care | Attending: Internal Medicine

## 2013-05-29 LAB — ANA: ANA: NEGATIVE

## 2013-05-29 LAB — HEMOGLOBIN A1C
Hgb A1c MFr Bld: 14.6 % — ABNORMAL HIGH (ref <5.7)
Mean Plasma Glucose: 372 mg/dL — ABNORMAL HIGH (ref <117)

## 2013-05-29 LAB — CARDIOLIPIN ANTIBODIES, IGG, IGM, IGA
ANTICARDIOLIPIN IGG: 5 GPL U/mL — AB (ref <23)
ANTICARDIOLIPIN IGM: 2 [MPL'U]/mL — AB (ref <11)
Anticardiolipin IgA: 10 APL U/mL — ABNORMAL LOW (ref <22)

## 2013-05-29 LAB — GLUCOSE, CAPILLARY
Glucose-Capillary: 292 mg/dL — ABNORMAL HIGH (ref 70–99)
Glucose-Capillary: 217 mg/dL — ABNORMAL HIGH (ref 70–99)
Glucose-Capillary: 217 mg/dL — ABNORMAL HIGH (ref 70–99)
Glucose-Capillary: 167 mg/dL — ABNORMAL HIGH (ref 70–99)

## 2013-05-29 LAB — HOMOCYSTEINE: Homocysteine: 8 umol/L (ref 4.0–15.4)

## 2013-05-29 LAB — C4 COMPLEMENT: Complement C4, Body Fluid: 31 mg/dL (ref 10–40)

## 2013-05-29 LAB — LUPUS ANTICOAGULANT PANEL
DRVVT: 35.1 secs (ref <42.9)
Lupus Anticoagulant: NOT DETECTED
PTT Lupus Anticoagulant: 37.9 secs (ref 28.0–43.0)

## 2013-05-29 LAB — C3 COMPLEMENT: C3 Complement: 115 mg/dL (ref 90–180)

## 2013-05-29 LAB — SODIUM: Sodium: 146 mEq/L (ref 137–147)

## 2013-05-29 LAB — PROTEIN C ACTIVITY: Protein C Activity: 200 % — ABNORMAL HIGH (ref 75–133)

## 2013-05-29 LAB — VDRL, CSF: VDRL Quant, CSF: NONREACTIVE

## 2013-05-29 LAB — PROTEIN S ACTIVITY: PROTEIN S ACTIVITY: 120 % (ref 69–129)

## 2013-05-29 SURGERY — CANCELLED PROCEDURE

## 2013-05-29 MED ORDER — MORPHINE SULFATE 2 MG/ML IJ SOLN: 2.0000 mg | INTRAMUSCULAR | Status: DC | PRN

## 2013-05-29 MED ORDER — SODIUM CHLORIDE 0.9 % IV BOLUS (SEPSIS)
500.0000 mL | Freq: Once | INTRAVENOUS | Status: AC
  Administered 2013-05-29: 500 mL via INTRAVENOUS

## 2013-05-29 MED ORDER — SODIUM CHLORIDE 3 % IV SOLN
INTRAVENOUS | Status: DC
  Administered 2013-05-29: 35 mL/h via INTRAVENOUS
  Filled 2013-05-29 (×2): qty 500

## 2013-05-29 MED ORDER — HYDRALAZINE HCL 20 MG/ML IJ SOLN
10.0000 mg | Freq: Once | INTRAMUSCULAR | Status: AC
  Administered 2013-05-29: 10 mg via INTRAVENOUS

## 2013-05-29 MED ORDER — FENTANYL CITRATE 0.05 MG/ML IJ SOLN
INTRAMUSCULAR | Status: AC
  Filled 2013-05-29: qty 2

## 2013-05-29 MED ORDER — INSULIN ASPART 100 UNIT/ML ~~LOC~~ SOLN
0.0000 [IU] | SUBCUTANEOUS | Status: DC
  Administered 2013-05-30: 5 [IU] via SUBCUTANEOUS
  Administered 2013-05-30 (×3): 3 [IU] via SUBCUTANEOUS
  Administered 2013-05-30: 5 [IU] via SUBCUTANEOUS
  Administered 2013-05-30: 3 [IU] via SUBCUTANEOUS
  Administered 2013-05-31 (×3): 2 [IU] via SUBCUTANEOUS
  Administered 2013-05-31: 3 [IU] via SUBCUTANEOUS
  Administered 2013-05-31 – 2013-06-01 (×3): 2 [IU] via SUBCUTANEOUS
  Administered 2013-06-01 (×2): 3 [IU] via SUBCUTANEOUS
  Administered 2013-06-01: 2 [IU] via SUBCUTANEOUS
  Administered 2013-06-01 (×2): 3 [IU] via SUBCUTANEOUS
  Administered 2013-06-02: 8 [IU] via SUBCUTANEOUS
  Administered 2013-06-02 (×3): 5 [IU] via SUBCUTANEOUS
  Administered 2013-06-02: 8 [IU] via SUBCUTANEOUS
  Administered 2013-06-02: 5 [IU] via SUBCUTANEOUS
  Administered 2013-06-03: 22:00:00 via SUBCUTANEOUS
  Administered 2013-06-03: 5 [IU] via SUBCUTANEOUS
  Administered 2013-06-03 (×2): 8 [IU] via SUBCUTANEOUS
  Administered 2013-06-03 (×2): 5 [IU] via SUBCUTANEOUS
  Administered 2013-06-04: 8 [IU] via SUBCUTANEOUS
  Administered 2013-06-04 (×2): 5 [IU] via SUBCUTANEOUS
  Administered 2013-06-04: 8 [IU] via SUBCUTANEOUS

## 2013-05-29 MED ORDER — SODIUM CHLORIDE 0.9 % IV SOLN: INTRAVENOUS | Status: DC

## 2013-05-29 MED ORDER — LABETALOL HCL 5 MG/ML IV SOLN
INTRAVENOUS | Status: AC
  Filled 2013-05-29: qty 4

## 2013-05-29 MED ORDER — MIDAZOLAM HCL 5 MG/ML IJ SOLN
INTRAMUSCULAR | Status: AC
  Filled 2013-05-29: qty 2

## 2013-05-29 MED ORDER — HYDRALAZINE HCL 20 MG/ML IJ SOLN
INTRAMUSCULAR | Status: AC
  Filled 2013-05-29: qty 1

## 2013-05-29 MED ORDER — SODIUM CHLORIDE 0.9 % IV SOLN
INTRAVENOUS | Status: DC
Start: 2013-05-29 — End: 2013-06-04
  Administered 2013-05-29: 06:00:00 via INTRAVENOUS
  Administered 2013-05-29: 100 mL via INTRAVENOUS
  Administered 2013-05-30: 1000 mL via INTRAVENOUS
  Administered 2013-05-30 – 2013-05-31 (×2): via INTRAVENOUS

## 2013-05-29 MED ORDER — ONDANSETRON HCL 4 MG/2ML IJ SOLN
4.0000 mg | Freq: Four times a day (QID) | INTRAMUSCULAR | Status: DC | PRN
  Administered 2013-05-29: 4 mg via INTRAVENOUS
  Filled 2013-05-29: qty 2

## 2013-05-29 NOTE — Progress Notes (Signed)
Rehab Admissions Coordinator Note:  Patient was screened by Cleatrice Burke for appropriateness for an Inpatient Acute Rehab Consult per PT recommendations this morning.  At this time, we are recommending Inpatient Rehab consult. Please order if you feel appropriate.  Cleatrice Burke 05/29/2013, 3:05 PM  I can be reached at [phone].

## 2013-05-29 NOTE — Evaluation (Signed)
Speech Language Pathology Evaluation Patient Details Name: [name] MRN: [id number] DOB: 31-Mar-1960 Today's Date: 05/29/2013 Time: 3382-5053 SLP Time Calculation (min): 25 min  Problem List:  Patient Active Problem List   Diagnosis Date Noted  . PRES (posterior reversible encephalopathy syndrome) 05/28/2013  . Malignant hypertension 05/28/2013  . DM (diabetes mellitus) 05/27/2013  . Acute CVA (cerebrovascular accident) 05/27/2013  . Aphasia 05/27/2013  . 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 Hypertensive emergency 05/27/2013  . Stroke 05/27/2013  . Aphasia complicating stroke 97/67/3419  . Hypertension    Past Medical History:  Past Medical History  Diagnosis Date  . Hypertension   . Diabetes mellitus    Past Surgical History: History reviewed. No pertinent past surgical history. HPI:  53 y.o. female presenting with difficulty speaking. Per MRI: Multiple foci of acute ischemia within the left middle cerebral artery territory. In addition, however punctate focus of acute ischemia within the left occipital lobe and right thalamus (both of which would be posterior circulation).   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  SLP provided verbal, visual, rhythmic cueing to facilitate verbal output which was ineffective. Moderate visual, contextual cueing successful to facilitate ability to carryout basic 1-step directions. Patient will benefit from SLP f/u in the acute care setting as well as CIR once medically stable.     SLP Assessment  Patient needs continued Speech Lanaguage Pathology Services    Follow Up Recommendations  Inpatient Rehab    Frequency and Duration min 2x/week  2 weeks   Pertinent Vitals/Pain n/a   SLP Goals  SLP Goals Potential to Achieve Goals: Good Potential Considerations: Severity of  impairments Progress/Goals/Alternative treatment plan discussed with pt/caregiver and they: Agree  SLP Evaluation Prior Functioning  Cognitive/Linguistic Baseline: Information not available Type of Home: House Available Help at Discharge: Family;Available PRN/intermittently Vocation:  (employed)   Cognition  Overall Cognitive Status: Impaired/Different from baseline Arousal/Alertness: Awake/alert Orientation Level:  (unable to assess due to aphasia/apraxia) Attention: Focused;Sustained Focused Attention: Appears intact Sustained Attention: Impaired Sustained Attention Impairment: Verbal basic;Functional basic Awareness:  (right sided inattention vs visual field cut) Comments: difficulty to formally assess due to severity of apraxia/aphasia. will continue to assess    Comprehension  Auditory Comprehension Overall Auditory Comprehension: Impaired Yes/No Questions: Impaired Basic Biographical Questions: 0-25% accurate Commands: Impaired One Step Basic Commands: 0-24% accurate (improved with visual and contextual cues) Interfering Components: Attention;Visual impairments EffectiveTechniques: Repetition;Visual/Gestural cues Visual Recognition/Discrimination Discrimination: Exceptions to [facility] Common Objects: Able in field of 2 (unable ) Reading Comprehension Reading Status: Not tested    Expression Expression Primary Mode of Expression: Nonverbal - gestures Verbal Expression Overall Verbal Expression: Impaired Initiation: Impaired Level of Generative/Spontaneous Verbalization:  (non-verbal) Repetition: Impaired Level of Impairment: Word level Naming: Impairment Responsive: 0-25% accurate Confrontation: Impaired Common Objects: Able in field of 2 (unable ) Convergent: 0-24% accurate Divergent: 0-24% accurate Non-Verbal Means of Communication: Gestures (minimal) Written Expression Dominant Hand: Left Written Expression: Not tested   Oral / Motor Oral Motor/Sensory  Function Overall Oral Motor/Sensory Function: Impaired Labial ROM: Reduced right Labial Symmetry: Abnormal symmetry right Labial Strength: Reduced Lingual ROM:  (difficult to assess due to aphasia/apraxia) Motor Speech Overall Motor Speech: Impaired Respiration: Within functional limits Motor Planning: Impaired Level of Impairment: Word Motor Speech Errors: Groping for words   GO    Gabriel Rainwater MA, CCC-SLP [phone]   Dalayza Zambrana Meryl  05/29/2013, 10:04 AM

## 2013-05-29 NOTE — CV Procedure (Signed)
TEE cancelled Family concerned about more lethargic mental status BP uncontrolled Given 10 mg labatolol and 10 mg of hydralazine and BP Still 233/127 Procedure not deemed safe at this time  Neuro unit notified  Jenkins Rouge

## 2013-05-29 NOTE — Progress Notes (Signed)
eLink Physician-Brief Progress Note Patient Name: [name] DOB: Jul 22, 1960 MRN: [id number]  Date of Service  05/29/2013   HPI/Events of Note   Hypertensive urgency, stroke, CT head this afternoon with evolving MCA territory stroke  Now with headache, nausea  eICU Interventions  Morphine prn zofran prn Discuss with neurology now re repeat CT   Intervention Category Intermediate Interventions: Pain - evaluation and management  Jabree Rebert 05/29/2013, 6:28 PM

## 2013-05-29 NOTE — Evaluation (Signed)
Physical Therapy Evaluation Patient Details Name: [name] MRN: [id number] DOB: May 15, 1960 Today's Date: 05/29/2013 Time: 0900-0950 PT Time Calculation (min): 50 min  PT Assessment / Plan / Recommendation History of Present Illness    53 y.o. female presenting with difficulty speaking. Per MRI: Multiple foci of acute ischemia within the left middle cerebral artery territory. In addition, however punctate focus of acute ischemia within the left occipital lobe and right thalamus (both of which would be posterior circulation   Clinical Impression  Pt admitted with above. Pt currently with functional limitations due to the deficits listed below (see PT Problem List). Pt will need extensive therapy on d/c.  Recommend Rehab.   Pt will benefit from skilled PT to increase their independence and safety with mobility to allow discharge to the venue listed below.     PT Assessment  Patient needs continued PT services    Follow Up Recommendations  CIR;Supervision/Assistance - 24 hour    Does the patient have the potential to tolerate intense rehabilitation      Barriers to Discharge Decreased caregiver support      Equipment Recommendations  Other (comment) (TBA)    Recommendations for Other Services Rehab consult   Frequency Min 4X/week    Precautions / Restrictions Precautions Precautions: Fall Restrictions Weight Bearing Restrictions: No   Pertinent Vitals/Pain VSS, No pain      Mobility  Bed Mobility Overal bed mobility: Needs Assistance Bed Mobility: Supine to Sit Supine to sit: Min assist General bed mobility comments: Pt did well moving LEs off bed.  Pt able to push up on left UE.[name] guard assist for most part.   Transfers Overall transfer level: Needs assistance Equipment used: None Transfers: Sit to/from [facility] Sit to Stand: Mod assist Stand pivot transfers: Mod assist General transfer comment: Pt performs sit to stand with assist for  anterior lean and blocking right knee.  Pt with fair postural stability in static stance with min assist.  Pt needed assist to move Right LE for pivot transfer and blocked right knee when pt stepping left LE but pt was able to weight shift and step left LE around to get to chair.  Did not follow cue to reach back and needed assist to control descent.   Modified Rankin (Stroke Patients Only) Pre-Morbid Rankin Score: No symptoms Modified Rankin: Severe disability    Exercises     PT Diagnosis: Hemiplegia non-dominant side  PT Problem List: Decreased activity tolerance;Decreased strength;Decreased balance;Decreased mobility;Decreased knowledge of use of DME;Decreased cognition;Decreased safety awareness;Decreased knowledge of precautions PT Treatment Interventions: DME instruction;Gait training;Therapeutic activities;Functional mobility training;Therapeutic exercise;Balance training;Patient/family education;Cognitive remediation;Neuromuscular re-education     PT Goals(Current goals can be found in the care plan section) Acute Rehab PT Goals Patient Stated Goal: to go home PT Goal Formulation: With patient Time For Goal Achievement: 06/05/13 Potential to Achieve Goals: Good  Visit Information  Last PT Received On: 05/29/13 Assistance Needed: +2       Prior Functioning  Home Living Family/patient expects to be discharged to:: Private residence Living Arrangements: Alone;Children Available Help at Discharge: Family;Available PRN/intermittently Type of Home: House Home Access: Stairs to enter [employer] of Steps: several Entrance Stairs-Rails: Can reach both Home Layout: One level Home Equipment: None Prior Function Level of Independence: Independent Comments: Pt working PTA and driving. Communication Communication: Receptive difficulties;Expressive difficulties Dominant Hand: Left    Cognition  Cognition Arousal/Alertness: Awake/alert Behavior During Therapy: Flat  affect Overall Cognitive Status: Impaired/Different from  baseline Area of Impairment: Memory;Following commands;Safety/judgement;Awareness;Problem solving Memory: Decreased short-term memory Following Commands: Follows one step commands inconsistently;Follows one step commands with increased time Safety/Judgement: Decreased awareness of safety;Decreased awareness of deficits Awareness: Intellectual Problem Solving: Difficulty sequencing;Requires verbal cues;Requires tactile cues;Slow processing General Comments: Pt with apraxia.  Has a right visual deficit as well.  Difficult to assess due to pt non verbal. Accuracy of answering yes/no questions is poor below 50%.      Extremity/Trunk Assessment Upper Extremity Assessment Upper Extremity Assessment: Defer to OT evaluation Lower Extremity Assessment Lower Extremity Assessment: RLE deficits/detail;Difficult to assess due to impaired cognition RLE Deficits / Details: Appears 3+/5.   RLE Sensation: decreased light touch   Balance Balance Overall balance assessment: Needs assistance;History of Falls Sitting-balance support: Single extremity supported;Feet supported Sitting balance-Leahy Scale: Fair Sitting balance - Comments: Sat EOB 4 min with min guard assist with good upright postural stability.  Standing balance support: No upper extremity supported;During functional activity Standing balance-Leahy Scale: Poor Standing balance comment: Pt needs asssist for static stance for right LE control.    End of Session PT - End of Session Equipment Utilized During Treatment: Gait belt Activity Tolerance: Patient limited by fatigue Patient left: in chair;with call bell/phone within reach;Other (comment);with chair alarm set (with ST in room) Nurse Communication: Mobility status  [name]     [name] 05/29/2013, 11:06 AM [name] Acute Rehabilitation [phone] (pager)

## 2013-05-29 NOTE — Progress Notes (Signed)
Inpatient Diabetes Program Recommendations  AACE/ADA: New Consensus Statement on Inpatient Glycemic Control (2013)  Target Ranges:  Prepandial:   less than 140 mg/dL      Peak postprandial:   less than 180 mg/dL (1-2 hours)      Critically ill patients:  140 - 180 mg/dL  Results for [name], [name] (MRN [id number]) as of 05/29/2013 11:02  Ref. Range 05/28/2013 12:43 05/28/2013 15:21 05/28/2013 20:18 05/28/2013 22:32 05/29/2013 08:10  Glucose-Capillary Latest Range: 70-99 mg/dL 305 (H) 340 (H) 353 (H) 278 (H) 292 (H)   Inpatient Diabetes Program Recommendations Insulin - Basal: consider increasing Lantus to 25 units  HgbA1C: pending Thank you  Raoul Pitch BSN, RN,CDE Inpatient Diabetes Coordinator [phone] (team pager)

## 2013-05-29 NOTE — Progress Notes (Signed)
[facility] Progress Note Patient Name: ADJA RUFF DOB: [birth year] MRN: [id number]  Date of Service  05/29/2013   HPI/Events of Note   After discussing with bedside RN further, sent for stat head CT and hypertonic saline  eICU Interventions  Discussed with neurology who agrees   Intervention Category Major Interventions: Change in mental status - evaluation and management  MCQUAID, DOUGLAS 05/29/2013, 7:20 PM

## 2013-05-29 NOTE — Progress Notes (Signed)
[facility] Progress Note Patient Name: MONSERRAT VIDAURRI DOB: [birth year] MRN: [id number]  Date of Service  05/29/2013   HPI/Events of Note   Head CT unchanged  eICU Interventions  Hold hypertonic saline F/U neurology recs   Intervention Category Intermediate Interventions: Diagnostic test evaluation  Braley Luckenbaugh 05/29/2013, 9:07 PM

## 2013-05-29 NOTE — Progress Notes (Signed)
OT Cancellation Note  Patient Details Name: [name] MRN: [id number] DOB: 06-10-1960   Cancelled Treatment:    Reason Eval/Treat Not Completed: Patient at procedure or test/ unavailable;Medical issues which prohibited therapy. Pt gone for CT scan with transfer back to ICU likely afterwards per nursing  [name] 05/29/2013, 1:33 PM

## 2013-05-29 NOTE — Progress Notes (Signed)
PULMONARY / CRITICAL CARE MEDICINE   Name: [name] MRN: [id number] DOB: [birth year]    ADMISSION DATE:  05/27/2013   PRIMARY SERVICE: PCCM  CHIEF COMPLAINT:  HTN w/ difficulty speaking   BRIEF PATIENT DESCRIPTION:  This is a 53 year old female w/ known h/o HTN, presented to the ER 3/10 w/ right sided facial droop and asphasia. Happened at some point during day 3/10 (but unknown exact time). Last seen nml state: 0830 3/10. Initial CT negative. BP 205/121. Seen by Neurology. PCCM asked to admit to NICU for stroke eval.   SIGNIFICANT EVENTS / STUDIES:  CT head 3/10: neg for bleed  MRI/MRA brain 3/10>>>Multiple foci of acute ischemia within the left middle cerebral artery territory. In addition, however punctate focus of acute ischemia within the left occipital lobe and right thalamus (both of which would be posterior circulation). All areas of ischemia have similar acuity. No hemorrhagic conversion. 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 Mild luminal irregularity of the intracranial vessels which can be seen with intracranial atherosclerosis though, may also be seen with vasculopathy. ECHO 3/10>>>Left ventricle: The cavity size was normal. Systolicfunction was normal. The estimated ejection fraction was in the range of 55% to 60%. Wall motion was normal Carotid dopplers 3/10>>> LP by neuro 3/10>>> neg  TEE 3/11>>>  LINES / TUBES:   CULTURES/autoimmune: 3/10 CSF>>> ------------------------------- HIV - NR  RPR - NR Antithrombin III - 125 (H) Homocysteine - wnl ANA>>> Protein C >> Protein S >> Lupus anticoag>> Prothrombin gene mut>>   ANTIBIOTICS:  SUBJECTIVE:  No acute distress.  Hypertensive.    VITAL SIGNS: Temp:  [98.1 F (36.7 C)-99.3 F (37.4 C)] 98.1 F (36.7 C) (03/12 0845) Pulse Rate:  [70-109] 101 (03/12 0920) Resp:  [14-25] 18 (03/12 0845) BP: (128-215)/(80-130) 199/114 mmHg (03/12 0920) SpO2:  [93 %-100 %] 99 % (03/12 0845) Weight:  [161 lb  6 oz (73.2 kg)] 161 lb 6 oz (73.2 kg) (03/12 0500) HEMODYNAMICS:   VENTILATOR SETTINGS:   INTAKE / OUTPUT: Intake/Output     03/11 0701 - 03/12 0700 03/12 0701 - 03/13 0700   I.V. (mL/kg) 1650 (22.5)    Total Intake(mL/kg) 1650 (22.5)    Urine (mL/kg/hr) 1400 (0.8) 100 (0.6)   Total Output 1400 100   Net +250 -100        Urine Occurrence 1 x 2 x   Stool Occurrence       PHYSICAL EXAMINATION: General:  Awake, No acute distress.  Neuro:  Awake, alert. RUE weakness, Right sided facial droop. Unable to fully assess orientation d/t expressive aphasia, follows commands intermittently. Tracks, assists with turning.  HEENT:  [place], no JVD, right droop   Cardiovascular:  rrr Lungs:  Clear  Abdomen:  Soft, non tender  Musculoskeletal:  Intact, no edema  Skin:  Intact   LABS:  CBC  Recent Labs Lab 05/27/13 1724 05/27/13 2332  WBC 8.3 8.3  HGB 16.1* 15.1*  HCT 44.5 42.5  PLT 259 267   Coag's  Recent Labs Lab 05/27/13 1724  APTT 26  INR 0.92   BMET  Recent Labs Lab 05/27/13 1724  NA 135*  K 3.7  CL 94*  CO2 24  BUN 10  CREATININE 0.73  GLUCOSE 353*   Electrolytes  Recent Labs Lab 05/27/13 1724  CALCIUM 9.7   Sepsis Markers No results found for this basename: LATICACIDVEN, PROCALCITON, O2SATVEN,  in the last 168 hours ABG No results found for this basename: PHART, PCO2ART, PO2ART,  in  the last 168 hours Liver Enzymes  Recent Labs Lab 05/27/13 1724  AST 16  ALT 14  ALKPHOS 120*  BILITOT 0.4  ALBUMIN 4.5   Cardiac Enzymes No results found for this basename: TROPONINI, PROBNP,  in the last 168 hours Glucose  Recent Labs Lab 05/28/13 0851 05/28/13 1243 05/28/13 1521 05/28/13 2018 05/28/13 2232 05/29/13 0810  GLUCAP 228* 305* 340* 353* 278* 292*    Imaging Dg Chest 2 View  05/27/2013   CLINICAL DATA Stroke symptoms, difficulty with speech.  EXAM CHEST  2 VIEW  COMPARISON 11/15/2006  FINDINGS Mild aortic tortuosity. Normal heart size. No  confluent airspace opacity. No pleural effusion or pneumothorax. Mild multilevel degenerative change without acute osseous finding.  IMPRESSION No radiographic evidence of an acute cardiopulmonary process.  SIGNATURE  Electronically Signed   By: Carlos Levering M.D.   On: 05/27/2013 22:33   Ct Head (brain) Wo Contrast  05/27/2013   CLINICAL DATA Slurred speech, right facial droop  EXAM CT HEAD WITHOUT CONTRAST  TECHNIQUE Contiguous axial images were obtained from the base of the skull through the vertex without intravenous contrast.  COMPARISON CT HEAD W/O CM dated 10/04/2009; CT HEAD W/O CM dated 02/02/2007; CT HEAD W/O CM dated 11/15/2006  FINDINGS There is no evidence of mass effect, midline shift, or extra-axial fluid collections. There is no evidence of a space-occupying lesion or intracranial hemorrhage. There is no evidence of a cortical-based area of acute infarction. There is periventricular white matter low attenuation likely secondary to microangiopathy.  The ventricles and sulci are appropriate for the patient's age. The basal cisterns are patent.  Visualized portions of the orbits are unremarkable. The visualized portions of the paranasal sinuses and mastoid air cells are unremarkable.  The osseous structures are unremarkable.  IMPRESSION No acute intracranial pathology.  SIGNATURE  Electronically Signed   By: Kathreen Devoid   On: 05/27/2013 18:19   Mr Angiogram Head Wo Contrast  05/27/2013   CLINICAL DATA Right facial droop, slurred speech.  EXAM MRI HEAD WITHOUT CONTRAST  MRA HEAD WITHOUT CONTRAST  TECHNIQUE Multiplanar, multiecho pulse sequences of the brain and surrounding structures were obtained without intravenous contrast. Angiographic images of the head were obtained using MRA technique without contrast.  COMPARISON CT HEAD W/O CM dated 05/27/2013; CT HEAD W/O CM dated 10/04/2009  FINDINGS MRI HEAD FINDINGS  Multiple foci of reduced diffusion in the left frontal temporal lobe, including 16 x  14 mm lesion and left corona radiata, 20 x 10 mm lesion in the high left frontal lobe at the gray-white matter junction. In addition, punctate focus of reduced diffusion within the right medial thalamus, and left occipital lobe. All foci show low ADC values in very mild FLAIR hyperintense signal, all appearing of similar age. No susceptibility artifact to suggest hemorrhage.  The ventricles and sulci are overall normal for patient's age. Scattered patchy supratentorial white matter T2 hyperintensities are far greater than expected for age, many which appear to radiate from the periventricular margin. In addition, symmetric homogenous bright T2 signal within the bilateral lenticulostriate nuclei, axial 13 /22. Patchy bright signal within the pons. However there is confluent periventricular white matter FLAIR T2 hyperintensity about the temporal horns and occipital horns. No midline shift.  No abnormal extra-axial fluid collections. Ocular globes and orbital contents are nonsuspicious though not tailored for evaluation. No abnormal sellar expansion or cerebellar tonsillar ectopia. The visualized paranasal sinuses and mastoid air cells are appear well aerated.  MRA HEAD FINDINGS  Anterior circulation: Normal flow related enhancement of the included cervical, petrous, cavernous and supra clinoid internal carotid arteries. Patent anterior communicating artery. Normal flow related enhancement of the anterior cerebral arteries, including more distal segments. Abrupt cut off of the left M1 segment, with absent distal left middle cerebral artery vessels. Patent right middle cerebral artery and distal segments, however there is somewhat distal segment luminal irregularity.  Posterior circulation: Left Vertebral artery is dominant. Basilar artery is patent, with normal flow related enhancement of the main branch vessels. Normal flow related enhancement of the posterior cerebral arteries centrally, however there is relative  paucity of right P3 branches compared to the left. Mildly irregular P2 segments, to lesser extent left P3 segment.  No hemodynamically significant stenosis, aneurysm within the anterior nor posterior circulation.  IMPRESSION MRI head: Multiple foci of acute ischemia within the left middle cerebral artery territory. In addition, however punctate focus of acute ischemia within the left occipital lobe and right thalamus (both of which would be posterior circulation). All areas of ischemia have similar acuity. No hemorrhagic conversion.  Symmetric abnormal bright signal within the lenticulostriate nuclei (basal ganglia) in addition to abnormal temporoccipital periventricular T2 hyperintense signal. These findings may reflect sequelae of acute severe hypertension (PRES) but, it can also be seen with osmotic demyelination, autoimmune disorders such as scleroderma (which can have associated vasculopathy), less likely infectious encephalitis.  Moderate to severe white matter changes, in a distribution which can be seen with demyelination, with superimposed possible chronic small vessel ischemic disease though, are nonspecific.  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  Mild luminal irregularity of the intracranial vessels which can be seen with intracranial atherosclerosis though, may also be seen with vasculopathy.  Critical Value/emergent results were called by telephone at the time of interpretation on 05/27/2013 at 10:28 PM to Dr. Calvert Cantor , who verbally acknowledged these results.  SIGNATURE  Electronically Signed   By: Elon Alas   On: 05/27/2013 22:32   [name] Contrast  05/27/2013   CLINICAL DATA Right facial droop, slurred speech.  EXAM MRI HEAD WITHOUT CONTRAST  MRA HEAD WITHOUT CONTRAST  TECHNIQUE Multiplanar, multiecho pulse sequences of the brain and surrounding structures were obtained without intravenous contrast. Angiographic images of the head were obtained using MRA  technique without contrast.  COMPARISON CT HEAD W/O CM dated 05/27/2013; CT HEAD W/O CM dated 10/04/2009  FINDINGS MRI HEAD FINDINGS  Multiple foci of reduced diffusion in the left frontal temporal lobe, including 16 x 14 mm lesion and left corona radiata, 20 x 10 mm lesion in the high left frontal lobe at the gray-white matter junction. In addition, punctate focus of reduced diffusion within the right medial thalamus, and left occipital lobe. All foci show low ADC values in very mild FLAIR hyperintense signal, all appearing of similar age. No susceptibility artifact to suggest hemorrhage.  The ventricles and sulci are overall normal for patient's age. Scattered patchy supratentorial white matter T2 hyperintensities are far greater than expected for age, many which appear to radiate from the periventricular margin. In addition, symmetric homogenous bright T2 signal within the bilateral lenticulostriate nuclei, axial 13 /22. Patchy bright signal within the pons. However there is confluent periventricular white matter FLAIR T2 hyperintensity about the temporal horns and occipital horns. No midline shift.  No abnormal extra-axial fluid collections. Ocular globes and orbital contents are nonsuspicious though not tailored for evaluation. No abnormal sellar expansion or cerebellar tonsillar ectopia. The visualized paranasal sinuses and mastoid  air cells are appear well aerated.  MRA HEAD FINDINGS  Anterior circulation: Normal flow related enhancement of the included cervical, petrous, cavernous and supra clinoid internal carotid arteries. Patent anterior communicating artery. Normal flow related enhancement of the anterior cerebral arteries, including more distal segments. Abrupt cut off of the left M1 segment, with absent distal left middle cerebral artery vessels. Patent right middle cerebral artery and distal segments, however there is somewhat distal segment luminal irregularity.  Posterior circulation: Left Vertebral  artery is dominant. Basilar artery is patent, with normal flow related enhancement of the main branch vessels. Normal flow related enhancement of the posterior cerebral arteries centrally, however there is relative paucity of right P3 branches compared to the left. Mildly irregular P2 segments, to lesser extent left P3 segment.  No hemodynamically significant stenosis, aneurysm within the anterior nor posterior circulation.  IMPRESSION MRI head: Multiple foci of acute ischemia within the left middle cerebral artery territory. In addition, however punctate focus of acute ischemia within the left occipital lobe and right thalamus (both of which would be posterior circulation). All areas of ischemia have similar acuity. No hemorrhagic conversion.  Symmetric abnormal bright signal within the lenticulostriate nuclei (basal ganglia) in addition to abnormal temporoccipital periventricular T2 hyperintense signal. These findings may reflect sequelae of acute severe hypertension (PRES) but, it can also be seen with osmotic demyelination, autoimmune disorders such as scleroderma (which can have associated vasculopathy), less likely infectious encephalitis.  Moderate to severe white matter changes, in a distribution which can be seen with demyelination, with superimposed possible chronic small vessel ischemic disease though, are nonspecific.  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  Mild luminal irregularity of the intracranial vessels which can be seen with intracranial atherosclerosis though, may also be seen with vasculopathy.  Critical Value/emergent results were called by telephone at the time of interpretation on 05/27/2013 at 10:28 PM to Dr. Calvert Cantor , who verbally acknowledged these results.  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 Infarcts felt to be embolic secondary to unknown source P:   For TEE to r/o embolic source [id number] hypercoag panel pending as above  Carotid dopplers pending  Cont asa  Risk factor modification  PT consult, OT consult, Speech consult    HTN P:  Will defer HTN management to stroke team but likely ready for scheduled rx now that she is >24 hours post acute CVA Cont PRN labetalol   prob mild hemoconcentration  P:  F/u CBC Lucien heparin   DM w/ hyperglycemia  P:   -Continue levemir/ SSI per DM RN recs - glucose remains elevated but will hold further adjustment for now with NPO   -Will likely need insulin adjustment 3/13 if sugars remain elevated   Looks good.  Now on tele. Continue current w/u per stroke team.  For TEE 3/12.  Will ask [facility] to assume care 3/13.   Darlina Sicilian, NP 05/29/2013  9:24 AM Pager: [phone] or [phone]  *Care during the described time interval was provided by me and/or other providers on the critical care team. I have reviewed this patient's available data, including medical history, events of note, physical examination and test results as part of my evaluation.  ?worsening flaccid paralysis on the right upper ext.  Remains aphasic and very HTN.  Will transfer back to the ICU and BP management per neuro recommendations.  Will need swallow evaluation prior to taking any PO.  CC time 35 min.  Patient seen and examined, agree with above note.  I dictated the care and orders written for this patient under my direction.  Rush Farmer, MD [phone]

## 2013-05-29 NOTE — Progress Notes (Signed)
Stroke Team Progress Note  HISTORY [name] is a 53 y.o. female with a history of hypertension and diabetes who presents 05/27/2013 with difficulty speaking that started at some point today. She did not notice it until after work, when she was speaking with her daughter. She did not speak to anyone at work today. The last time that she spoke to someone was when she was leaving for work around 8:30 AM. Patient was not administerd TPA secondary to delay in arrival. She was considered for enrollment in [facility].  She was admitted to the ICU for further evaluation and treatment.  SUBJECTIVE Patient up in chair. No family present upon our arrival. Daughter arrived during rounds.  OBJECTIVE Most recent Vital Signs: Filed Vitals:   05/29/13 0351 05/29/13 0400 05/29/13 0500 05/29/13 0610  BP:  158/88 155/87 193/99  Pulse:  89 83 98  Temp: 98.6 F (37 C)   98.4 F (36.9 C)  TempSrc: Oral   Oral  Resp:  17 16 16   Height:      Weight:   73.2 kg (161 lb 6 oz)   SpO2:  98% 97% 100%   CBG (last 3)   Recent Labs  05/28/13 2018 05/28/13 2232 05/29/13 0810  GLUCAP 353* 278* 292*    IV Fluid Intake:   . sodium chloride    . sodium chloride 50 mL/hr at 05/29/13 0617    MEDICATIONS  . aspirin  324 mg Oral Daily  . heparin  5,000 Units Subcutaneous 3 times per day  . influenza vac split quadrivalent PF  0.5 mL Intramuscular Tomorrow-1000  . insulin aspart  0-15 Units Subcutaneous TID WC  . insulin aspart  0-5 Units Subcutaneous QHS  . insulin detemir  18 Units Subcutaneous QHS  . pneumococcal 23 valent vaccine  0.5 mL Intramuscular Tomorrow-1000  . simvastatin  20 mg Oral q1800   PRN:  labetalol  Diet:  NPO for TEE Activity:   DVT Prophylaxis:  Heparin 5000 units sq tid   CLINICALLY SIGNIFICANT STUDIES Basic Metabolic Panel:   Recent Labs Lab 05/27/13 1724  NA 135*  K 3.7  CL 94*  CO2 24  GLUCOSE 353*  BUN 10  CREATININE 0.73  CALCIUM 9.7   Liver Function Tests:    Recent Labs Lab 05/27/13 1724  AST 16  ALT 14  ALKPHOS 120*  BILITOT 0.4  PROT 8.8*  ALBUMIN 4.5   CBC:   Recent Labs Lab 05/27/13 1724 05/27/13 2332  WBC 8.3 8.3  NEUTROABS 4.9  --   HGB 16.1* 15.1*  HCT 44.5 42.5  MCV 84.9 85.0  PLT 259 267   Coagulation:   Recent Labs Lab 05/27/13 1724  LABPROT 12.2  INR 0.92   Cardiac Enzymes: No results found for this basename: CKTOTAL, CKMB, CKMBINDEX, TROPONINI,  in the last 168 hours Urinalysis:   Recent Labs Lab 05/28/13 0842  COLORURINE YELLOW  LABSPEC 1.037*  PHURINE 5.0  GLUCOSEU >1000*  HGBUR NEGATIVE  BILIRUBINUR NEGATIVE  KETONESUR 15*  PROTEINUR NEGATIVE  UROBILINOGEN 0.2  NITRITE NEGATIVE  LEUKOCYTESUR NEGATIVE   Lipid Panel    Component Value Date/Time   CHOL 201* 05/28/2013 0300   TRIG 145 05/28/2013 0300   HDL 42 05/28/2013 0300   CHOLHDL 4.8 05/28/2013 0300   VLDL 29 05/28/2013 0300   LDLCALC 130* 05/28/2013 0300   HgbA1C  Lab Results  Component Value Date   HGBA1C 15.1* 02/24/2010    Urine Drug Screen:  Component Value Date/Time   LABOPIA NONE DETECTED 10/04/2009 0314   COCAINSCRNUR NONE DETECTED 10/04/2009 0314   LABBENZ NONE DETECTED 10/04/2009 0314   AMPHETMU NONE DETECTED 10/04/2009 0314   THCU NONE DETECTED 10/04/2009 0314   LABBARB  Value: NONE DETECTED        DRUG SCREEN FOR MEDICAL PURPOSES ONLY.  IF CONFIRMATION IS NEEDED FOR ANY PURPOSE, NOTIFY LAB WITHIN 5 DAYS.        LOWEST DETECTABLE LIMITS FOR URINE DRUG SCREEN Drug Class       Cutoff (ng/mL) Amphetamine      1000 Barbiturate      200 Benzodiazepine   665 Tricyclics       993 Opiates          300 Cocaine          300 THC              50 10/04/2009 0314    Alcohol Level: No results found for this basename: ETH,  in the last 168 hours  Hypercoagulable Workup Normal - C3, C4, RPR,  HIV, homocysteine Pending - CH50, ANA, Prot C activity, Protein C total, Protein S activity, Protein S total, lupus anticoagulant, cardiolipin  antibody  ESR 25 (H) Anti III 125 (H)  CT of the brain  05/27/2013   No acute intracranial pathology.    MRI of the brain  05/27/2013     Multiple foci of acute ischemia within the left middle cerebral artery territory. In addition, however punctate focus of acute ischemia within the left occipital lobe and right thalamus (both of which would be posterior circulation). All areas of ischemia have similar acuity. No hemorrhagic conversion.  Symmetric abnormal bright signal within the lenticulostriate nuclei (basal ganglia) in addition to abnormal temporoccipital periventricular T2 hyperintense signal. These findings may reflect sequelae of acute severe hypertension (PRES) but, it can also be seen with osmotic demyelination, autoimmune disorders such as scleroderma (which can have associated vasculopathy), less likely infectious encephalitis.  Moderate to severe white matter changes, in a distribution which can be seen with demyelination, with superimposed possible chronic small vessel ischemic disease though, are nonspecific.    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  Mild luminal irregularity of the intracranial vessels which can be seen with intracranial atherosclerosis though, may also be seen with vasculopathy.    2D Echocardiogram  EF 55-60% with no source of embolus.   Carotid Doppler  No evidence of hemodynamically significant internal carotid artery stenosis. Vertebral artery flow is antegrade.   TEE    CXR  05/27/2013    No radiographic evidence of an acute cardiopulmonary process.    EKG  sinus tachycardia. For complete results please see formal report.   Therapy Recommendations   Physical Exam   Middle aged  African american lady not in distress.Awake alert. Afebrile. Head is nontraumatic. Neck is supple without bruit.. Cardiac exam no murmur or gallop. Lungs are clear to auscultation. Distal pulses are well felt. Neurological Exam : Awake alert  moderate expressive aphasia with significant word hesitancy and nonfluent speech. Can speak words and short sentences. Able to comprehend one and two-step commands only. Unable to name or repeat. Extraocular movements are full range without nystagmus. Fundi were not visualized. Vision acuity seems adequate. Decreased blink to threat on the right compared to the left. Moderate right lower facial weakness. Tongue midline. No upper or lower extremity drift. Finger-to-nose coordination seems accurate. Unable to comprehend  needle coordination testing. Underlying lung sensory system testing. Plantars both downgoing. Gait was not tested.  ASSESSMENT [name] is a 53 y.o. female presenting with difficulty speaking. 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  Infarcts felt to be embolic secondary to unknown source.   On no antithrombotics prior to admission. Now on aspirin 324 mg chewable daily for secondary stroke prevention. Patient with resultant expressive and receptive aphasia, right field cut. Patient with progresive neuro worsening since stroke rounds yesterday, now with R arm > leg hemiparesis; right arm plegic. Stroke work up underway.  LP done on arrival unrevealing.  Malignant hypertension Diabetes, HgbA1c pending, goal < 7.0 Hyperlipidemia, LDL 130, on no statin PTA, now on zocor 20 mg daily, goal LDL < 70 for diabetics  Hospital day # 2  TREATMENT/PLAN  Will lay flat until am, fluid bolus 500c followed by 100cc/hr given neuro worsening. Discussed with RN. May need to consider transferring back to ICU after discussion with the PCCM M.D.  Continue aspirin for secondary stroke prevention.  F/u HgbA1c TEE to look for embolic source. Arranged with [facility] for today.  If positive for PFO (patent foramen ovale), check bilateral lower extremity venous  dopplers to rule out DVT as possible source of stroke. F/u Hypercoagulable labs; results thus far unrevealing.    Burnetta Sabin, MSN, RN, ANVP-BC, AGPCNP-BC [name] Pager: [phone] 05/29/2013 9:08 AM This patient is critically ill and at significant risk of neurological worsening, death and care requires constant monitoring of vital signs, hemodynamics,respiratory and cardiac monitoring,review of multiple databases, neurological assessment, discussion with family, other specialists and medical decision making of high complexity. I spent 30 minutes of neurocritical care time  in the care of  this patient. I have personally obtained a history, examined the patient, evaluated imaging results, and formulated the assessment and plan of care. I agree with the above. Antony Contras, MD

## 2013-05-29 NOTE — Progress Notes (Signed)
Received report from Kathlee Nations in endo dr. Johnsie Cancel isn't doing tee deems unsafe in r/t current condition. Pressures have been at the lowest 211/141 and at the highest 240/130. Has received 10mg  of labetalol and 10 mg of hydralazine and pressures have not moved. I have paged sharon bibey on the stroke pager and am awaiting return call. Patient may benefit from return to ICU. Received return call from sharon bibey, wants to know how this nurse thinks she is in r/t when she left the floor for endo. Will return call and let her know of assessment and vital signs upon arrival from endo.

## 2013-05-30 ENCOUNTER — Inpatient Hospital Stay (HOSPITAL_COMMUNITY): Payer: Medicaid Other

## 2013-05-30 DIAGNOSIS — I6350 Cerebral infarction due to unspecified occlusion or stenosis of unspecified cerebral artery: Secondary | ICD-10-CM

## 2013-05-30 DIAGNOSIS — I6340 Cerebral infarction due to embolism of unspecified cerebral artery: Secondary | ICD-10-CM

## 2013-05-30 DIAGNOSIS — I10 Essential (primary) hypertension: Secondary | ICD-10-CM

## 2013-05-30 LAB — PROTEIN S, TOTAL: PROTEIN S AG TOTAL: 133 % (ref 60–150)

## 2013-05-30 LAB — SODIUM
SODIUM: 139 meq/L (ref 137–147)
Sodium: 138 mEq/L (ref 137–147)
Sodium: 139 mEq/L (ref 137–147)

## 2013-05-30 LAB — CBC
HCT: 42.9 % (ref 36.0–46.0)
HEMATOCRIT: 44 % (ref 36.0–46.0)
HEMOGLOBIN: 15.1 g/dL — AB (ref 12.0–15.0)
Hemoglobin: 15.5 g/dL — ABNORMAL HIGH (ref 12.0–15.0)
MCH: 30.4 pg (ref 26.0–34.0)
MCH: 30.6 pg (ref 26.0–34.0)
MCHC: 35.2 g/dL (ref 30.0–36.0)
MCHC: 35.2 g/dL (ref 30.0–36.0)
MCV: 86.5 fL (ref 78.0–100.0)
MCV: 86.8 fL (ref 78.0–100.0)
Platelets: 261 10*3/uL (ref 150–400)
Platelets: 250 10*3/uL (ref 150–400)
RBC: 4.96 MIL/uL (ref 3.87–5.11)
RBC: 5.07 MIL/uL (ref 3.87–5.11)
RDW: 11.9 % (ref 11.5–15.5)
RDW: 11.9 % (ref 11.5–15.5)
WBC: 9.3 10*3/uL (ref 4.0–10.5)
WBC: 9.8 10*3/uL (ref 4.0–10.5)

## 2013-05-30 LAB — CK TOTAL AND CKMB (NOT AT ARMC)
CK TOTAL: 87 U/L (ref 7–177)
CK, MB: 1 ng/mL (ref 0.3–4.0)
CK, MB: 1 ng/mL (ref 0.3–4.0)
Relative Index: INVALID (ref 0.0–2.5)
Relative Index: INVALID (ref 0.0–2.5)
Total CK: 84 U/L (ref 7–177)

## 2013-05-30 LAB — BASIC METABOLIC PANEL
BUN: 7 mg/dL (ref 6–23)
CALCIUM: 8.9 mg/dL (ref 8.4–10.5)
CO2: 23 mEq/L (ref 19–32)
CREATININE: 0.67 mg/dL (ref 0.50–1.10)
Chloride: 101 mEq/L (ref 96–112)
GFR calc Af Amer: >90 mL/min (ref >90)
GFR calc non Af Amer: >90 mL/min (ref >90)
GLUCOSE: 213 mg/dL — AB (ref 70–99)
Potassium: 3.8 mEq/L (ref 3.7–5.3)
Sodium: 142 mEq/L (ref 137–147)

## 2013-05-30 LAB — URINALYSIS, ROUTINE W REFLEX MICROSCOPIC
Glucose, UA: >1000 mg/dL — AB
Hgb urine dipstick: NEGATIVE
KETONES UR: 40 mg/dL — AB
LEUKOCYTES UA: NEGATIVE
NITRITE: NEGATIVE
PROTEIN: NEGATIVE mg/dL
Specific Gravity, Urine: 1.039 — ABNORMAL HIGH (ref 1.005–1.030)
Urobilinogen, UA: 0.2 mg/dL (ref 0.0–1.0)
pH: 5 (ref 5.0–8.0)

## 2013-05-30 LAB — GLUCOSE, CAPILLARY
GLUCOSE-CAPILLARY: 178 mg/dL — AB (ref 70–99)
Glucose-Capillary: 165 mg/dL — ABNORMAL HIGH (ref 70–99)
Glucose-Capillary: 180 mg/dL — ABNORMAL HIGH (ref 70–99)
Glucose-Capillary: 199 mg/dL — ABNORMAL HIGH (ref 70–99)
Glucose-Capillary: 201 mg/dL — ABNORMAL HIGH (ref 70–99)
Glucose-Capillary: 212 mg/dL — ABNORMAL HIGH (ref 70–99)

## 2013-05-30 LAB — COMPREHENSIVE METABOLIC PANEL
ALT: 11 U/L (ref 0–35)
AST: 25 U/L (ref 0–37)
Albumin: 3.3 g/dL — ABNORMAL LOW (ref 3.5–5.2)
Alkaline Phosphatase: 98 U/L (ref 39–117)
BUN: 7 mg/dL (ref 6–23)
CHLORIDE: 100 meq/L (ref 96–112)
CO2: 19 mEq/L (ref 19–32)
Calcium: 9.1 mg/dL (ref 8.4–10.5)
Creatinine, Ser: 0.66 mg/dL (ref 0.50–1.10)
GFR calc Af Amer: >90 mL/min (ref >90)
GFR calc non Af Amer: >90 mL/min (ref >90)
Glucose, Bld: 260 mg/dL — ABNORMAL HIGH (ref 70–99)
POTASSIUM: 4.1 meq/L (ref 3.7–5.3)
SODIUM: 139 meq/L (ref 137–147)
TOTAL PROTEIN: 7.3 g/dL (ref 6.0–8.3)
Total Bilirubin: 0.4 mg/dL (ref 0.3–1.2)

## 2013-05-30 LAB — PROTEIN C, TOTAL: Protein C, Total: 165 % — ABNORMAL HIGH (ref 72–160)

## 2013-05-30 LAB — MAGNESIUM: Magnesium: 1.8 mg/dL (ref 1.5–2.5)

## 2013-05-30 LAB — POCT I-STAT 3, ART BLOOD GAS (G3+)
Acid-base deficit: 1 mmol/L (ref 0.0–2.0)
BICARBONATE: 22.2 meq/L (ref 20.0–24.0)
O2 SAT: 100 %
TCO2: 23 mmol/L (ref 0–100)
pCO2 arterial: 32.5 mmHg — ABNORMAL LOW (ref 35.0–45.0)
pH, Arterial: 7.443 (ref 7.350–7.450)
pO2, Arterial: 260 mmHg — ABNORMAL HIGH (ref 80.0–100.0)

## 2013-05-30 LAB — TROPONIN I
Troponin I: <0.3 ng/mL (ref <0.30)
Troponin I: <0.3 ng/mL (ref <0.30)
Troponin I: <0.3 ng/mL (ref <0.30)

## 2013-05-30 LAB — COMPLEMENT, TOTAL

## 2013-05-30 LAB — URINE MICROSCOPIC-ADD ON

## 2013-05-30 LAB — PROTHROMBIN GENE MUTATION

## 2013-05-30 LAB — PHOSPHORUS: Phosphorus: 3.5 mg/dL (ref 2.3–4.6)

## 2013-05-30 LAB — LACTIC ACID, PLASMA: LACTIC ACID, VENOUS: 1.5 mmol/L (ref 0.5–2.2)

## 2013-05-30 LAB — PROCALCITONIN: PROCALCITONIN: 0.11 ng/mL

## 2013-05-30 LAB — HEMOGLOBIN A1C
Hgb A1c MFr Bld: 14.1 % — ABNORMAL HIGH (ref <5.7)
Mean Plasma Glucose: 358 mg/dL — ABNORMAL HIGH (ref <117)

## 2013-05-30 LAB — STREP PNEUMONIAE URINARY ANTIGEN: Strep Pneumo Urinary Antigen: NEGATIVE

## 2013-05-30 MED ORDER — ASPIRIN 300 MG RE SUPP
300.0000 mg | Freq: Every day | RECTAL | Status: DC
  Administered 2013-05-30 – 2013-06-04 (×6): 300 mg via RECTAL
  Filled 2013-05-30 (×6): qty 1

## 2013-05-30 MED ORDER — IOHEXOL 350 MG/ML SOLN
50.0000 mL | Freq: Once | INTRAVENOUS | Status: AC | PRN
  Administered 2013-05-30: 50 mL via INTRAVENOUS

## 2013-05-30 MED ORDER — LABETALOL HCL 5 MG/ML IV SOLN: 10.0000 mg | INTRAVENOUS | Status: DC | PRN

## 2013-05-30 MED ORDER — NYSTATIN 100000 UNIT/ML MT SUSP
5.0000 mL | Freq: Four times a day (QID) | OROMUCOSAL | Status: DC
  Administered 2013-05-30 – 2013-06-04 (×21): 500000 [IU] via ORAL
  Filled 2013-05-30 (×24): qty 5

## 2013-05-30 MED ORDER — BLOOD PRESSURE CONTROL BOOK
Freq: Once | Status: AC
  Administered 2013-05-30: 17:00:00
  Filled 2013-05-30: qty 1

## 2013-05-30 MED ORDER — MAGNESIUM SULFATE 40 MG/ML IJ SOLN
2.0000 g | Freq: Once | INTRAMUSCULAR | Status: AC
  Administered 2013-05-30: 2 g via INTRAVENOUS
  Filled 2013-05-30: qty 50

## 2013-05-30 MED ORDER — STROKE: EARLY STAGES OF RECOVERY BOOK
Freq: Once | Status: AC
  Administered 2013-05-30: 13:00:00
  Filled 2013-05-30: qty 1

## 2013-05-30 NOTE — Procedures (Signed)
ELECTROENCEPHALOGRAM REPORT   Patient: [name]       Room #: 2C00 EEG No. ID: [id number] Age: 53 y.o.        Sex: female Referring Physician: Chase Caller Report Date:  05/30/2013        Interpreting Physician: Alexis Goodell D  History: [name] is an 53 y.o. female with difficulty with speech  Medications:  Scheduled: . aspirin  300 mg Rectal Daily  . heparin  5,000 Units Subcutaneous 3 times per day  . insulin aspart  0-15 Units Subcutaneous 6 times per day  . nystatin  5 mL Oral QID    Conditions of Recording:  This is a 16 channel EEG carried out with the patient in the awake, drowsy and asleep states.  Description:  The background activity is asymmetric particularly during wakefulness the posterior background rhythm from the right hemisphere consists of a low voltage, fairly well organized, 9 Hz alpha activity, seen from the parieto-occipital and posterior temporal regions.  Low voltage fast activity, poorly organized, is seen anteriorly and is at times superimposed on more posterior regions.  A mixture of theta and alpha rhythms are seen from the central and temporal regions.  On the left the background rhythm is much slower and consists of mostly theta and delta rhythms that are poorly organized.  Also noted over the left hemisphere is frequent sharp activity that has some spread to the right frontal regions at times.   This asymmetry begins to normalize as the patient drowses and both hemispheres slow.   The patient goes in to a light sleep with symmetrical sleep spindles, vertex central sharp transients and irregular slow activity.  The left sharp activity continues to occur frequently.   Hyperventilation and intermittent photic stimulation were not performed.   IMPRESSION: This is an abnormal EEG secondary to slowing over the left hmeisphere with frequent left hemispheric sharp acitivty as well..  This finding is suggestive of a focal disturbance that is  etiologically nonspecific, but may include a mass lesion or post-ictal focal slowing among other possibilities.       Alexis Goodell, MD [facility] [phone] 05/30/2013, 7:36 PM

## 2013-05-30 NOTE — Progress Notes (Signed)
Rehab admissions - Chart reviewed and noted that rehab MD, Dr. Naaman Plummer, did recommend the possibility of further inpatient acute rehab pending her medical stability.  Noted from earlier PT/OT notes that pt had episode of unresponsiveness with elevated BP and possible stroke code called.  Will check back on pt status on 3-16 and see her medical status and progression/participation with PT/OT.  Please call me with any questions. Thanks.  Nanetta Batty, PT Rehabilitation Admissions Coordinator [phone]

## 2013-05-30 NOTE — Progress Notes (Signed)
PT Cancellation Note  Patient Details Name: [name] MRN: [id number] DOB: [birth year]   Cancelled Treatment:    Reason Eval/Treat Not Completed: Patient not medically ready. Attempted to see pt at 10:15, however BP 182/116. RN reported BP meds due soon. At 10:45 pt involved in ? Code Stroke (room filled with RNs, MDs, etc)   [name] 05/30/2013, 11:27 AM Pager [phone]

## 2013-05-30 NOTE — Consult Note (Signed)
Physical Medicine and Rehabilitation Consult  Reason for Consult: Right sided weakness, aphsia Referring Physician: [facility]   HPI: [name] is a 53 y.o. female with history of HTN, DM, bipolar disorder, who was admitted on 05/27/13 with right facial droop and aphasia. 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 2 D echo with EF 55-60%. Carotid dopplers without significant ICA stenosis. 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 Patient with progressive neurologic worsening on 05/29/13 pm with RUE >RLE weakness. Started on hypertonic fluids and transferred to ICU for closer monitoring. Follow up CCT without significant changes. TEE ordered for work up. EEG pending.  MD/Rehab team recommending CIR.    Review of Systems  Unable to perform ROS: mental acuity    Past Medical History  Diagnosis Date  . Hypertension   . Depression   . Diabetes mellitus     ?type (05/29/2013)  . Bipolar affective disorder   . Schizophrenia     [name] 11/28/2007 (05/29/2013)  . Stroke 05/27/2013   Past Surgical History  Procedure Laterality Date  . Cesarean section  ~ 1987  . Tubal ligation     History reviewed. No pertinent family history.  Social History:  reports that she has never smoked. She has never used smokeless tobacco. She reports that she does not drink alcohol or use illicit drugs.  Allergies: No Known Allergies  No prescriptions prior to admission    Home: Home Living Family/patient expects to be discharged to:: Private residence Living Arrangements: Alone;Children Available Help at  Discharge: Family;Available PRN/intermittently Type of Home: House Home Access: Stairs to enter [employer] of Steps: several Entrance Stairs-Rails: Can reach both Home Layout: One level Home Equipment: None  Functional History: Prior Function Vocation:  (employed) Comments: Pt working PTA and driving. Functional Status:  Mobility:          ADL:    Cognition: Cognition Overall Cognitive Status: Impaired/Different from baseline Arousal/Alertness: Awake/alert Orientation Level: Other (comment) (UTA, Apahasic) Attention: Focused;Sustained Focused Attention: Appears intact Sustained Attention: Impaired Sustained Attention Impairment: Verbal basic;Functional basic Awareness:  (right sided inattention vs visual field cut) Comments: difficulty to formally assess due to severity of apraxia/aphasia. will continue to assess Cognition Arousal/Alertness: Awake/alert Behavior During Therapy: Flat affect Overall Cognitive Status: Impaired/Different from baseline Area of Impairment: Memory;Following commands;Safety/judgement;Awareness;Problem solving Memory: Decreased short-term memory Following Commands: Follows one step commands inconsistently;Follows one step commands with increased time Safety/Judgement: Decreased awareness of safety;Decreased awareness of deficits Awareness: Intellectual Problem Solving: Difficulty sequencing;Requires verbal cues;Requires tactile cues;Slow processing General Comments: Pt with apraxia.  Has a right visual deficit as well.  Difficult to assess due to pt non verbal. Accuracy of answering yes/no questions is poor below 50%.    Blood pressure 135/94, pulse 91, temperature 99.1 F (37.3 C), temperature source Oral, resp. rate 23, height 5\' 6"  (1.676 m), weight 73.2 kg (161 lb 6 oz), SpO2 100.00%. Physical Exam  Constitutional: She appears well-nourished.  HENT:  Head: Atraumatic.  Eyes: EOM are normal.  Neck: No JVD present. No tracheal  deviation present. No thyromegaly present.  Cardiovascular: Normal rate.   Respiratory: No respiratory distress.  GI:  She exhibits no distension.  Neurological:  Obtunded. Moved left hand and foot slightly with pain and with verbal cueing . Withdrew slightly to pain on right as well. Kept eye closed.     Results for orders placed during the hospital encounter of 05/27/13 (from the past 24 hour(s))  GLUCOSE, CAPILLARY     Status: Abnormal   Collection Time    05/29/13  4:12 PM      Result Value Ref Range   Glucose-Capillary 217 (*) 70 - 99 mg/dL  GLUCOSE, CAPILLARY     Status: Abnormal   Collection Time    05/29/13  7:15 PM      Result Value Ref Range   Glucose-Capillary 167 (*) 70 - 99 mg/dL  SODIUM     Status: None   Collection Time    05/29/13  9:04 PM      Result Value Ref Range   Sodium 146  137 - 147 mEq/L  GLUCOSE, CAPILLARY     Status: Abnormal   Collection Time    05/30/13 12:21 AM      Result Value Ref Range   Glucose-Capillary 201 (*) 70 - 99 mg/dL   Comment 1 Documented in Chart     Comment 2 Notify RN    CBC     Status: Abnormal   Collection Time    05/30/13  2:43 AM      Result Value Ref Range   WBC 9.3  4.0 - 10.5 K/uL   RBC 4.96  3.87 - 5.11 MIL/uL   Hemoglobin 15.1 (*) 12.0 - 15.0 g/dL   HCT 42.9  36.0 - 46.0 %   MCV 86.5  78.0 - 100.0 fL   MCH 30.4  26.0 - 34.0 pg   MCHC 35.2  30.0 - 36.0 g/dL   RDW 11.9  11.5 - 15.5 %   Platelets 261  150 - 400 K/uL  BASIC METABOLIC PANEL     Status: Abnormal   Collection Time    05/30/13  2:43 AM      Result Value Ref Range   Sodium 142  137 - 147 mEq/L   Potassium 3.8  3.7 - 5.3 mEq/L   Chloride 101  96 - 112 mEq/L   CO2 23  19 - 32 mEq/L   Glucose, Bld 213 (*) 70 - 99 mg/dL   BUN 7  6 - 23 mg/dL   Creatinine, Ser 0.67  0.50 - 1.10 mg/dL   Calcium 8.9  8.4 - 10.5 mg/dL   GFR calc non Af Amer >90  >90 mL/min   GFR calc Af Amer >90  >90 mL/min  MAGNESIUM     Status: None   Collection Time    05/30/13  2:43  AM      Result Value Ref Range   Magnesium 1.8  1.5 - 2.5 mg/dL  PHOSPHORUS     Status: None   Collection Time    05/30/13  2:43 AM      Result Value Ref Range   Phosphorus 3.5  2.3 - 4.6 mg/dL  GLUCOSE, CAPILLARY     Status: Abnormal   Collection Time    05/30/13  3:53 AM      Result Value Ref Range   Glucose-Capillary 180 (*) 70 - 99 mg/dL   Comment 1 Documented in Chart     Comment 2 Notify RN    SODIUM     Status: None   Collection Time    05/30/13  7:55  AM      Result Value Ref Range   Sodium 139  137 - 147 mEq/L  GLUCOSE, CAPILLARY     Status: Abnormal   Collection Time    05/30/13  8:27 AM      Result Value Ref Range   Glucose-Capillary 178 (*) 70 - 99 mg/dL  POCT I-STAT 3, BLOOD GAS (G3+)     Status: Abnormal   Collection Time    05/30/13 10:52 AM      Result Value Ref Range   pH, Arterial 7.443  7.350 - 7.450   pCO2 arterial 32.5 (*) 35.0 - 45.0 mmHg   pO2, Arterial 260.0 (*) 80.0 - 100.0 mmHg   Bicarbonate 22.2  20.0 - 24.0 mEq/L   TCO2 23  0 - 100 mmol/L   O2 Saturation 100.0     Acid-base deficit 1.0  0.0 - 2.0 mmol/L   Patient temperature 99.1 F     Collection site RADIAL, ALLEN'S TEST ACCEPTABLE     Drawn by Operator     Sample type ARTERIAL     Ct Head Wo Contrast  05/29/2013   CLINICAL DATA:  Worsening headache.  EXAM: CT HEAD WITHOUT CONTRAST  TECHNIQUE: Contiguous axial images were obtained from the base of the skull through the vertex without intravenous contrast.  COMPARISON:  CT scan of May 29, 2013.  FINDINGS: Bony calvarium appears intact. No midline shift is noted. Ventricular size is within normal limits. 15 x 12 mm rounded low density is noted in left basal ganglia consistent with infarction. 22 x 20 mm rounded low density is noted in left periventricular white matter consistent with infarction. Stable left frontal lobe density is noted as well. These are unchanged compared to prior exam. There is no evidence of hemorrhage or mass lesion.   IMPRESSION: Stable left-sided infarctions are noted. No significant changes noted compared to prior exam.   Electronically Signed   By: Sabino Dick M.D.   On: 05/29/2013 20:22   Ct Head Wo Contrast  05/29/2013   CLINICAL DATA:  Right-sided weakness  EXAM: CT HEAD WITHOUT CONTRAST  TECHNIQUE: Contiguous axial images were obtained from the base of the skull through the vertex without intravenous contrast.  COMPARISON:  MR HEAD W/O CM dated 05/27/2013; MR MRA HEAD W/O CM dated 05/27/2013; CT HEAD W/O CM dated 05/27/2013; CT HEAD W/O CM dated 10/04/2009  FINDINGS: There is no evidence of mass effect, midline shift or extra-axial fluid collections. There is no evidence of a space-occupying lesion or intracranial hemorrhage. There are areas of low attenuation in the left frontal lobe, left basal ganglia, left insula and small foci in the left parietal lobe consistent with multiple infarcts.  The ventricles and sulci are appropriate for the patient's age. The basal cisterns are patent.  Visualized portions of the orbits are unremarkable. The visualized portions of the paranasal sinuses and mastoid air cells are unremarkable.  The osseous structures are unremarkable.  IMPRESSION: Involving multifocal left MCA territory nonhemorrhagic infarcts. The area of low-attenuation the left basal ganglia and left insula are larger compared with prior exams.   Electronically Signed   By: Kathreen Devoid   On: 05/29/2013 13:57    Assessment/Plan: Diagnosis: embolic cva, ?PRES 1. Does the need for close, 24 hr/day medical supervision in concert with the patient's rehab needs make it unreasonable for this patient to be served in a less intensive setting? Yes 2. Co-Morbidities requiring supervision/potential complications: DM, aphasia 3. Due to bladder management, bowel  management, safety, skin/wound care, disease management, medication administration, pain management and patient education, does the patient require 24 hr/day rehab  nursing? Yes 4. Does the patient require coordinated care of a physician, rehab nurse, PT (1-2 hrs/day, 5 days/week), OT (1-2 hrs/day, 5 days/week) and SLP (1-2 hrs/day, 5 days/week) to address physical and functional deficits in the context of the above medical diagnosis(es)? Yes Addressing deficits in the following areas: balance, endurance, locomotion, strength, transferring, bowel/bladder control, bathing, dressing, feeding, grooming, toileting, cognition, speech, language, swallowing and psychosocial support 5. Can the patient actively participate in an intensive therapy program of at least 3 hrs of therapy per day at least 5 days per week? Yes 6. The potential for patient to make measurable gains while on inpatient rehab is good 7. Anticipated functional outcomes upon discharge from inpatient rehab are supervision to min assist with PT, supervision to min assist with OT, supervision with SLP. 8. Estimated rehab length of stay to reach the above functional goals is: 14-20 9. Does the patient have adequate social supports to accommodate these discharge functional goals? Potentially 10. Anticipated D/C setting: Home 11. Anticipated post D/C treatments: [facility] therapy 12. Overall Rehab/Functional Prognosis: good  RECOMMENDATIONS: This patient's condition is appropriate for continued rehabilitative care in the following setting: CIR pending medical stability Patient has agreed to participate in recommended program. Yes Note that insurance prior authorization may be required for reimbursement for recommended care.  Comment: Rehab Admissions Coordinator to follow up.  Thanks,  Meredith Staggers, MD, [facility] Drown     05/30/2013

## 2013-05-30 NOTE — Progress Notes (Signed)
Stroke Team Progress Note  HISTORY [name] is a 53 y.o. female with a history of hypertension and diabetes who presented 05/27/2013 with difficulty speaking that started at some point on the day of admission. She did not notice it until after work, when she was speaking with her daughter. She did not speak to anyone at work that day.. The last time that she spoke to someone was when she was leaving for work around 8:30 AM. Patient was not administerd TPA secondary to delay in arrival. She was considered for enrollment in [facility].  She was admitted to the ICU for further evaluation and treatment.  SUBJECTIVE Two family members were at the bedside. The patient remains aphasic and made no attempts to speak. She followed minimal simple commands.  OBJECTIVE Most recent Vital Signs: Filed Vitals:   05/30/13 0400 05/30/13 0500 05/30/13 0600 05/30/13 0700  BP: 184/108 192/120 175/119 178/104  Pulse: 109 101 104 100  Temp:      TempSrc:      Resp: 18 19 15 16   Height:      Weight:      SpO2: 96% 98% 98% 98%   CBG (last 3)   Recent Labs  05/29/13 1915 05/30/13 0021 05/30/13 0353  GLUCAP 167* 201* 180*    IV Fluid Intake:   . sodium chloride 1,000 mL (05/30/13 0436)  . sodium chloride (hypertonic) Stopped (05/29/13 2122)    MEDICATIONS  . aspirin  324 mg Oral Daily  . heparin  5,000 Units Subcutaneous 3 times per day  . influenza vac split quadrivalent PF  0.5 mL Intramuscular Tomorrow-1000  . insulin aspart  0-15 Units Subcutaneous 6 times per day  . pneumococcal 23 valent vaccine  0.5 mL Intramuscular Tomorrow-1000  . simvastatin  20 mg Oral q1800   PRN:  labetalol, morphine injection, ondansetron (ZOFRAN) IV  Diet:    NPO no liquids Activity:  Bedrest - increase activity DVT Prophylaxis:  Heparin 5000 units sq tid   CLINICALLY SIGNIFICANT STUDIES Basic Metabolic Panel:   Recent Labs Lab 05/27/13 1724 05/29/13 2104 05/30/13 0243  NA 135* 146 142  K 3.7  --   3.8  CL 94*  --  101  CO2 24  --  23  GLUCOSE 353*  --  213*  BUN 10  --  7  CREATININE 0.73  --  0.67  CALCIUM 9.7  --  8.9  MG  --   --  1.8  PHOS  --   --  3.5   Liver Function Tests:   Recent Labs Lab 05/27/13 1724  AST 16  ALT 14  ALKPHOS 120*  BILITOT 0.4  PROT 8.8*  ALBUMIN 4.5   CBC:   Recent Labs Lab 05/27/13 1724 05/27/13 2332 05/30/13 0243  WBC 8.3 8.3 9.3  NEUTROABS 4.9  --   --   HGB 16.1* 15.1* 15.1*  HCT 44.5 42.5 42.9  MCV 84.9 85.0 86.5  PLT 259 267 261   Coagulation:   Recent Labs Lab 05/27/13 1724  LABPROT 12.2  INR 0.92   Cardiac Enzymes: No results found for this basename: CKTOTAL, CKMB, CKMBINDEX, TROPONINI,  in the last 168 hours Urinalysis:   Recent Labs Lab 05/28/13 0842  COLORURINE YELLOW  LABSPEC 1.037*  PHURINE 5.0  GLUCOSEU >1000*  HGBUR NEGATIVE  BILIRUBINUR NEGATIVE  KETONESUR 15*  PROTEINUR NEGATIVE  UROBILINOGEN 0.2  NITRITE NEGATIVE  LEUKOCYTESUR NEGATIVE   Lipid Panel    Component Value Date/Time  CHOL 201* 05/28/2013 0300   TRIG 145 05/28/2013 0300   HDL 42 05/28/2013 0300   CHOLHDL 4.8 05/28/2013 0300   VLDL 29 05/28/2013 0300   LDLCALC 130* 05/28/2013 0300   HgbA1C  Lab Results  Component Value Date   HGBA1C 14.6* 05/28/2013    Urine Drug Screen:     Component Value Date/Time   LABOPIA NONE DETECTED 10/04/2009 0314   COCAINSCRNUR NONE DETECTED 10/04/2009 0314   LABBENZ NONE DETECTED 10/04/2009 0314   AMPHETMU NONE DETECTED 10/04/2009 0314   THCU NONE DETECTED 10/04/2009 0314   LABBARB  Value: NONE DETECTED        DRUG SCREEN FOR MEDICAL PURPOSES ONLY.  IF CONFIRMATION IS NEEDED FOR ANY PURPOSE, NOTIFY LAB WITHIN 5 DAYS.        LOWEST DETECTABLE LIMITS FOR URINE DRUG SCREEN Drug Class       Cutoff (ng/mL) Amphetamine      1000 Barbiturate      200 Benzodiazepine   379 Tricyclics       024 Opiates          300 Cocaine          300 THC              50 10/04/2009 0314    Alcohol Level: No results found for  this basename: ETH,  in the last 168 hours  Hypercoagulable Workup Normal - C3, C4, RPR,  HIV, homocysteine Pending - CH50, ANA, Prot C activity, Protein C total, Protein S activity, Protein S total, lupus anticoagulant, cardiolipin antibody  ESR 25 (H) Anti III 125 (H)  CT of the brain  05/27/2013   No acute intracranial pathology.    MRI of the brain  05/27/2013     Multiple foci of acute ischemia within the left middle cerebral artery territory. In addition, however punctate focus of acute ischemia within the left occipital lobe and right thalamus (both of which would be posterior circulation). All areas of ischemia have similar acuity. No hemorrhagic conversion.  Symmetric abnormal bright signal within the lenticulostriate nuclei (basal ganglia) in addition to abnormal temporoccipital periventricular T2 hyperintense signal. These findings may reflect sequelae of acute severe hypertension (PRES) but, it can also be seen with osmotic demyelination, autoimmune disorders such as scleroderma (which can have associated vasculopathy), less likely infectious encephalitis.  Moderate to severe white matter changes, in a distribution which can be seen with demyelination, with superimposed possible chronic small vessel ischemic disease though, are nonspecific.    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  Mild luminal irregularity of the intracranial vessels which can be seen with intracranial atherosclerosis though, may also be seen with vasculopathy.    2D Echocardiogram  EF 55-60% with no source of embolus.   Carotid Doppler  No evidence of hemodynamically significant internal carotid artery stenosis. Vertebral artery flow is antegrade.   TEE  - When patient felt to be stable  CXR  05/27/2013    No radiographic evidence of an acute cardiopulmonary process.    EKG  sinus tachycardia. For complete results please see formal report.   Therapy Recommendations inpatient  rehabilitation. Rehabilitation M.D. consult note pending.  Physical Exam   Middle aged  African american lady not in distress.Awake alert. Afebrile. Head is nontraumatic. Neck is supple without bruit.. Cardiac exam no murmur or gallop. Lungs are clear to auscultation. Distal pulses are well felt. Neurological Exam : Awake alert moderate expressive aphasia with  significant word hesitancy and nonfluent speech. Unable to speak words and short sentences. Able to comprehend one  -step commands only. Unable to name or repeat. Extraocular movements are full range without nystagmus. Fundi were not visualized. Vision acuity seems adequate. Decreased blink to threat on the right compared to the left. Moderate right lower facial weakness. Tongue midline. Dense right upper extremity plegia with 0/5 strength and 2-3/5 strength in the right lower extremity and able to move it against gravity. Normal strength in the left side. Plantars both downgoing. Gait was not tested.  ASSESSMENT [name] is a 53 y.o. female presenting with difficulty speaking. 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  Infarcts felt to be embolic secondary to unknown source.   On no antithrombotics prior to admission. Now on aspirin 324 mg chewable daily for secondary stroke prevention. Patient with resultant expressive and receptive aphasia, right field cut and right hemiplegia. Patient with progresive neuro worsening over the last 2 days when she was sat up at the bedside in the setting of relative hypotension for her blood pressure, now with R arm > leg hemiparesis; right arm plegic. Stroke work up underway.  LP done on arrival unrevealing.  Malignant hypertension - however neuro status declined with lower blood pressures. Diabetes, HgbA1c pending, goal < 7.0 -  hemoglobin A1c 14.6 Hyperlipidemia, LDL 130, on no statin  PTA, now on zocor 20 mg daily, goal LDL < 70 for diabetics NPO - reevaluate swallowing - may need tube feedings.  Hospital day # 3  TREATMENT/PLAN  Continue aspirin for secondary stroke prevention.  TEE pending until patient felt to be stable. F/u Hypercoagulable labs; results thus far unrevealing. -  ESR - 25 (H) ; anti-thrombin 3 - 125 (H) ; protein C activity -  200 (H);  protein C total - 165 (H) cardiolipin antibodies - (L) Inpatient rehabilitation M.D. recommends inpatient rehabilitation. Keep systolic blood pressure in the 180-200 range has the patient seems to be dependent on this pressure and has neurological worsening when blood pressure is close to the normal range Multiple discussions with family members at the bedside as well as with Dr. Audree Camel Rinehuls PA-C [facility] Pager [phone] 05/30/2013, 7:53 AM  This patient is critically ill and at significant risk of neurological worsening, death and care requires constant monitoring of vital signs, hemodynamics,respiratory and cardiac monitoring,review of multiple databases, neurological assessment, discussion with family, other specialists and medical decision making of high complexity. I spent 40 minutes of neurocritical care time  in the care of  this patient. I have personally obtained a history, examined the patient, evaluated imaging results, and formulated the assessment and plan of care. I agree with the above. Antony Contras, MD

## 2013-05-30 NOTE — Progress Notes (Addendum)
PULMONARY / CRITICAL CARE MEDICINE   Name: [name] MRN: [id number] DOB: Feb 06, 1961    ADMISSION DATE:  05/27/2013   PRIMARY SERVICE: PCCM  CHIEF COMPLAINT:  HTN w/ difficulty speaking   BRIEF PATIENT DESCRIPTION:  This is a 53 year old female w/ known h/o HTN, presented to the ER 3/10 w/ right sided facial droop and asphasia. Happened at some point during day 3/10 (but unknown exact time). Last seen nml state: 0830 3/10. Initial CT negative. BP 205/121. Seen by Neurology. PCCM asked to admit to NICU for stroke eval.   SIGNIFICANT EVENTS / STUDIES:  CT head 3/10: neg for bleed  MRI/MRA brain 3/10>>>Multiple foci of acute ischemia within the left middle cerebral artery territory. In addition, however punctate focus of acute ischemia within the left occipital lobe and right thalamus (both of which would be posterior circulation). All areas of ischemia have similar acuity. No hemorrhagic conversion. 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 Mild luminal irregularity of the intracranial vessels which can be seen with intracranial atherosclerosis though, may also be seen with vasculopathy. ECHO 3/10>>>Left ventricle: The cavity size was normal. Systolicfunction was normal. The estimated ejection fraction was in the range of 55% to 60%. Wall motion was normal Carotid dopplers 3/10>>> LP by neuro 3/10>>> neg  TEE 3/11>>>  LINES / TUBES:   CULTURES/autoimmune: 3/10 CSF>>> ------------------------------- HIV - NR  RPR - NR Antithrombin III - 125 (H) Homocysteine - wnl ANA>>> Protein C >> Protein S >> Lupus anticoag>> Prothrombin gene mut>>   ANTIBIOTICS:  SUBJECTIVE:  Got unresponsiv while sitting and waiting for SDU bed. Associated with slight decrease ni BP to ? 135 sbp and some labetalol. Rt side LE was flaccid. Seemed to improve a bit after bp rose. PCCM asked to sign back on and keep patient in ICU   VITAL SIGNS: Temp:  [98.7 F (37.1 C)-99.6 F (37.6 C)] 99.1  F (37.3 C) (03/13 0845) Pulse Rate:  [91-115] 91 (03/13 1100) Resp:  [15-25] 23 (03/13 1100) BP: (135-245)/(94-142) 135/94 mmHg (03/13 1030) SpO2:  [96 %-100 %] 100 % (03/13 1100) HEMODYNAMICS:   VENTILATOR SETTINGS:   INTAKE / OUTPUT: Intake/Output     03/12 0701 - 03/13 0700 03/13 0701 - 03/14 0700   I.V. (mL/kg) 2264.2 (30.9) 300 (4.1)   Total Intake(mL/kg) 2264.2 (30.9) 300 (4.1)   Urine (mL/kg/hr) 103 (0.1)    Total Output 103     Net +2161.2 +300        Urine Occurrence 8 x 1 x   Stool Occurrence       PHYSICAL EXAMINATION: General: Unresponsive and diaphoretic Neuro:  Awake, alert. RUE weakness, Right sided facial droop. Opens mouth to command but drowsy. RUE 0/5, RLE initially 0/5 and then 2-3/5. Weak Gag HEENT:  [place], no JVD, right droop   Cardiovascular:  rrr Lungs:  Clear  Abdomen:  Soft, non tender  Musculoskeletal:  Intact, no edema  Skin:  Intact   LABS:  PULMONARY  Recent Labs Lab 05/30/13 1052  PHART 7.443  PCO2ART 32.5*  PO2ART 260.0*  HCO3 22.2  TCO2 23  O2SAT 100.0    CBC  Recent Labs Lab 05/27/13 1724 05/27/13 2332 05/30/13 0243  HGB 16.1* 15.1* 15.1*  HCT 44.5 42.5 42.9  WBC 8.3 8.3 9.3  PLT 259 267 261    COAGULATION  Recent Labs Lab 05/27/13 1724  INR 0.92    CARDIAC  No results found for this basename: TROPONINI,  in the last 168 hours  No results found for this basename: PROBNP,  in the last 168 hours   CHEMISTRY  Recent Labs Lab 05/27/13 1724 05/29/13 2104 05/30/13 0243 05/30/13 0755  NA 135* 146 142 139  K 3.7  --  3.8  --   CL 94*  --  101  --   CO2 24  --  23  --   GLUCOSE 353*  --  213*  --   BUN 10  --  7  --   CREATININE 0.73  --  0.67  --   CALCIUM 9.7  --  8.9  --   MG  --   --  1.8  --   PHOS  --   --  3.5  --    Estimated Creatinine Clearance: 86.2 ml/min (by C-G formula based on Cr of 0.67).   LIVER  Recent Labs Lab 05/27/13 1724  AST 16  ALT 14  ALKPHOS 120*  BILITOT 0.4  PROT  8.8*  ALBUMIN 4.5  INR 0.92     INFECTIOUS No results found for this basename: LATICACIDVEN, PROCALCITON,  in the last 168 hours   ENDOCRINE CBG (last 3)   Recent Labs  05/30/13 0021 05/30/13 0353 05/30/13 0827  GLUCAP 201* 180* 178*         IMAGING x48h  Ct Head Wo Contrast  05/29/2013   CLINICAL DATA:  Worsening headache.  EXAM: CT HEAD WITHOUT CONTRAST  TECHNIQUE: Contiguous axial images were obtained from the base of the skull through the vertex without intravenous contrast.  COMPARISON:  CT scan of May 29, 2013.  FINDINGS: Bony calvarium appears intact. No midline shift is noted. Ventricular size is within normal limits. 15 x 12 mm rounded low density is noted in left basal ganglia consistent with infarction. 22 x 20 mm rounded low density is noted in left periventricular white matter consistent with infarction. Stable left frontal lobe density is noted as well. These are unchanged compared to prior exam. There is no evidence of hemorrhage or mass lesion.  IMPRESSION: Stable left-sided infarctions are noted. No significant changes noted compared to prior exam.   Electronically Signed   By: Sabino Dick M.D.   On: 05/29/2013 20:22   Ct Head Wo Contrast  05/29/2013   CLINICAL DATA:  Right-sided weakness  EXAM: CT HEAD WITHOUT CONTRAST  TECHNIQUE: Contiguous axial images were obtained from the base of the skull through the vertex without intravenous contrast.  COMPARISON:  MR HEAD W/O CM dated 05/27/2013; MR MRA HEAD W/O CM dated 05/27/2013; CT HEAD W/O CM dated 05/27/2013; CT HEAD W/O CM dated 10/04/2009  FINDINGS: There is no evidence of mass effect, midline shift or extra-axial fluid collections. There is no evidence of a space-occupying lesion or intracranial hemorrhage. There are areas of low attenuation in the left frontal lobe, left basal ganglia, left insula and small foci in the left parietal lobe consistent with multiple infarcts.  The ventricles and sulci are appropriate  for the patient's age. The basal cisterns are patent.  Visualized portions of the orbits are unremarkable. The visualized portions of the paranasal sinuses and mastoid air cells are unremarkable.  The osseous structures are unremarkable.  IMPRESSION: Involving multifocal left MCA territory nonhemorrhagic infarcts. The area of low-attenuation the left basal ganglia and left insula are larger compared with prior exams.   Electronically Signed   By: Kathreen Devoid   On: 05/29/2013 13:57       ASSESSMENT / PLAN: Acute CVA: multiple bilateral, most  in the R MCA but also R occipital and L thalamus infarcts w/ resultant expressive asphasia and right facial droop. Infarcts felt to be embolic secondary to unknown source   - Worsening mental status and weakness due to relative hypotension possibly. Was not stable for TEE P:   Allow hypertension; no drugs. Call md when bp > 200 sbp. Looks like 170 is lowest patient can handle TEE to r/o embolic source when stable hypercoag panel pending as above  CT angio neck and head per neuro Cont asa  Risk factor modification  PT consult, OT consult, Speech consult     PULMONARY A:at risk for intubation P:   Npo Intubate if mental status worsesn  CARDIOVASCULAR A: concern for embolic stroke P:  TEE when stable Serial enzymes  RENAL A:  Mild hypo mag P:   Rx with mag  GASTROINTESTINAL A:  AT risk for aspiration P:   Npo inclding meds. Needs feeding tube  HEMATOLOGIC A:  Chronic mild polycythemia not relatd to smoking nos P:  monitor  INFECTIOUS A:  AT risk for aspiration Concern for endocarditis - low P:   Sepsis biomarkers PCT TEE when able Hydrate  ENDOCRINE A:  Hx of DM. HgbA1C 14.6 P:   ssi   TODAY'S SUMMARY: PCCM primary. Worse today. Keep in ICU. Son and niece updated. D/w Neuro, ? Move to neuro ICU     The patient is critically ill with multiple organ systems failure and requires high complexity decision making for  assessment and support, frequent evaluation and titration of therapies, application of advanced monitoring technologies and extensive interpretation of multiple databases.   Critical Care Time devoted to patient care services described in this note is  35  Minutes.  Dr. Brand Males, M.D., [facility].C.P Pulmonary and Critical Care Medicine Staff Physician [place] [facility] Pager: [phone], If no answer or between  15:00h - 7:00h: call [phone]     05/30/2013 11:22 AM

## 2013-05-30 NOTE — Progress Notes (Signed)
OT Cancellation Note  Patient Details Name: [name] MRN: [id number] DOB: 06/14/1960   Cancelled Treatment:    Reason Eval/Treat Not Completed: Medical issues which prohibited therapy;Patient not medically ready.  Pt with high BP earlier this morning. Then pt with episode of unresponsiveness per MD note.  Will check back when pt appropriate to participate in therapy.  05/30/2013 Darrol Jump OTR/L Pager [phone] Office [phone]

## 2013-05-30 NOTE — Progress Notes (Signed)
INITIAL NUTRITION ASSESSMENT  DOCUMENTATION CODES Per approved criteria  -Not Applicable   INTERVENTION: - If unable to tolerate oral intake, recommend initiate PePUp Protocol. Day 1 of TF protocol start Vital 1.2 at 25 mL/hr for the remainder of the day. Day 2 of TF protocol start new goal rate of 1320 mL (55 mL/hr). This will provide 1584 kcal, 99 g protein, 1071 ml free water - Diet advancement per SLP recommendations  NUTRITION DIAGNOSIS: Inability to eat related to aspiration risk as evidenced by NPO status.   Goal: Patient will meet >/=90% of estimated nutrition needs  Monitor:  Diet advancement or TF initiation, weight, labs, I/Os  Reason for Assessment: Low [place]  53 y.o. female  Admitting Dx: Acute CVA (cerebrovascular accident)  ASSESSMENT: 53 year old female with a history of hypertension and diabetes, admitted with difficulty speaking. Found to have CVA. Patient with worsening mental status, weakness, and aphasia.  NG tube placed due to aspiration risk. Patient has been NPO for 3 days.  SLP consult noted   Height: Ht Readings from Last 1 Encounters:  05/27/13 5\' 6"  (1.676 m)    Weight: Wt Readings from Last 1 Encounters:  05/29/13 161 lb 6 oz (73.2 kg)    Ideal Body Weight: 130 pounds  % Ideal Body Weight: 124%  Wt Readings from Last 10 Encounters:  05/29/13 161 lb 6 oz (73.2 kg)  05/29/13 161 lb 6 oz (73.2 kg)    Usual Body Weight: Unknown  % Usual Body Weight:   BMI:  Body mass index is 26.06 kg/(m^2). Patient is overweight.   Estimated Nutritional Needs: Kcal: 1600-1800 kcal Protein: 85-100 g Fluid: >2.5 L/day  Skin: Intact  Diet Order: NPO  EDUCATION NEEDS: -Education not appropriate at this time   Intake/Output Summary (Last 24 hours) at 05/30/13 1509 Last data filed at 05/30/13 1228  Gross per 24 hour  Intake 2814.17 ml  Output      3 ml  Net 2811.17 ml    Last BM: PTA   Labs:   Recent Labs Lab 05/27/13 1724  05/29/13 2104 05/30/13 0243 05/30/13 0755 05/30/13 1100 05/30/13 1352  NA 135* 146 142 139 139 138  K 3.7  --  3.8  --  4.1  --   CL 94*  --  101  --  100  --   CO2 24  --  23  --  19  --   BUN 10  --  7  --  7  --   CREATININE 0.73  --  0.67  --  0.66  --   CALCIUM 9.7  --  8.9  --  9.1  --   MG  --   --  1.8  --   --   --   PHOS  --   --  3.5  --   --   --   GLUCOSE 353*  --  213*  --  260*  --     CBG (last 3)   Recent Labs  05/30/13 0353 05/30/13 0827 05/30/13 1309  GLUCAP 180* 178* 199*    Scheduled Meds: . aspirin  300 mg Rectal Daily  . blood pressure control book   Does not apply Once  . heparin  5,000 Units Subcutaneous 3 times per day  . insulin aspart  0-15 Units Subcutaneous 6 times per day  . nystatin  5 mL Oral QID    Continuous Infusions: . sodium chloride 100 mL/hr at 05/30/13 1452  Past Medical History  Diagnosis Date  . Hypertension   . Depression   . Diabetes mellitus     ?type (05/29/2013)  . Bipolar affective disorder   . Schizophrenia     [name] 11/28/2007 (05/29/2013)  . Stroke 05/27/2013    Past Surgical History  Procedure Laterality Date  . Cesarean section  ~ 1987  . Tubal ligation      Larey Seat, RD, LDN Pager #: [place] Pager #: [phone]

## 2013-05-30 NOTE — Progress Notes (Signed)
EEG Completed; Results Pending  

## 2013-05-30 NOTE — Progress Notes (Signed)
Inpatient Diabetes Program Recommendations  AACE/ADA: New Consensus Statement on Inpatient Glycemic Control (2013)  Target Ranges:  Prepandial:   less than 140 mg/dL      Peak postprandial:   less than 180 mg/dL (1-2 hours)      Critically ill patients:  140 - 180 mg/dL   Reason for Assessment:  Results for [name], [name] (MRN [id number]) as of 05/30/2013 14:16  Ref. Range 05/29/2013 19:15 05/30/2013 00:21 05/30/2013 03:53 05/30/2013 08:27 05/30/2013 13:09  Glucose-Capillary Latest Range: 70-99 mg/dL 167 (H) 201 (H) 180 (H) 178 (H) 199 (H)   May consider restarting Levemir 18 units daily.    Thanks, Adah Perl, RN, BC-ADM Inpatient Diabetes Coordinator Pager [phone]

## 2013-05-31 DIAGNOSIS — R4701 Aphasia: Secondary | ICD-10-CM

## 2013-05-31 LAB — GLUCOSE, CAPILLARY
Glucose-Capillary: 126 mg/dL — ABNORMAL HIGH (ref 70–99)
Glucose-Capillary: 127 mg/dL — ABNORMAL HIGH (ref 70–99)
Glucose-Capillary: 142 mg/dL — ABNORMAL HIGH (ref 70–99)
Glucose-Capillary: 142 mg/dL — ABNORMAL HIGH (ref 70–99)
Glucose-Capillary: 145 mg/dL — ABNORMAL HIGH (ref 70–99)
Glucose-Capillary: 153 mg/dL — ABNORMAL HIGH (ref 70–99)
Glucose-Capillary: 225 mg/dL — ABNORMAL HIGH (ref 70–99)

## 2013-05-31 LAB — URINE CULTURE
COLONY COUNT: NO GROWTH
CULTURE: NO GROWTH

## 2013-05-31 LAB — CSF CULTURE W GRAM STAIN
Culture: NO GROWTH
Gram Stain: NONE SEEN
Special Requests: NORMAL

## 2013-05-31 LAB — SODIUM
SODIUM: 140 meq/L (ref 137–147)
Sodium: 140 mEq/L (ref 137–147)

## 2013-05-31 LAB — TROPONIN I: Troponin I: <0.3 ng/mL (ref <0.30)

## 2013-05-31 LAB — PROCALCITONIN: Procalcitonin: <0.1 ng/mL

## 2013-05-31 LAB — CSF CULTURE

## 2013-05-31 NOTE — Progress Notes (Signed)
PT Cancellation Note  Patient Details Name: [name] MRN: [id number] DOB: July 14, 1960   Cancelled Treatment:    Reason Eval/Treat Not Completed: Medical issues which prohibited therapy.  High BP and ?orthostasis.  Will see pt as able 3/15. 05/31/2013  [name], [name] [phone]  (pager)   [name], [name] 05/31/2013, 4:37 PM

## 2013-05-31 NOTE — Progress Notes (Signed)
Stroke Team Progress Note  HISTORY [name] is a 53 y.o. female with a history of hypertension and diabetes who presented 05/27/2013 with difficulty speaking that started at some point on the day of admission. She did not notice it until after work, when she was speaking with her daughter. She did not speak to anyone at work that day.. The last time that she spoke to someone was when she was leaving for work around 8:30 AM. Patient was not administerd TPA secondary to delay in arrival. She was considered for enrollment in [facility].  She was admitted to the ICU for further evaluation and treatment.  SUBJECTIVE  Nothing new to report. Family is at bedside. Case discussed with the family.  OBJECTIVE Most recent Vital Signs: Filed Vitals:   05/31/13 0400 05/31/13 0500 05/31/13 0600 05/31/13 0700  BP: 192/110 192/103 164/101 197/97  Pulse: 121 104 89 91  Temp:      TempSrc:      Resp: _0 Height:      Weight:      SpO2: 94% 91% 99% 94%   CBG (last 3)   Recent Labs  05/31/13 0018 05/31/13 0342 05/31/13 0739  GLUCAP 145* 142* 153*    IV Fluid Intake:   . sodium chloride 100 mL/hr at 05/31/13 0146    MEDICATIONS  . aspirin  300 mg Rectal Daily  . heparin  5,000 Units Subcutaneous 3 times per day  . insulin aspart  0-15 Units Subcutaneous 6 times per day  . nystatin  5 mL Oral QID   PRN:  morphine injection, ondansetron (ZOFRAN) IV  Diet:  NPO no liquids Activity:  Bedrest - increase activity DVT Prophylaxis:  Heparin 5000 units sq tid   CLINICALLY SIGNIFICANT STUDIES Basic Metabolic Panel:   Recent Labs Lab 05/29/13 2104 05/30/13 0243  05/30/13 1100  05/30/13 2000 05/31/13 0150  NA 146 142  < > 139  < > 139 140  K  --  3.8  --  4.1  --   --   --   CL  --  101  --  100  --   --   --   CO2  --  23  --  19  --   --   --   GLUCOSE  --  213*  --  260*  --   --   --   BUN  --  7  --  7  --   --   --   CREATININE  --  0.67  --  0.66  --   --   --    CALCIUM  --  8.9  --  9.1  --   --   --   MG  --  1.8  --   --   --   --   --   PHOS  --  3.5  --   --   --   --   --   < > = values in this interval not displayed. Liver Function Tests:   Recent Labs Lab 05/27/13 1724 05/30/13 1100  AST 16 25  ALT 14 11  ALKPHOS 120* 98  BILITOT 0.4 0.4  PROT 8.8* 7.3  ALBUMIN 4.5 3.3*   CBC:   Recent Labs Lab 05/27/13 1724  05/30/13 0243 05/30/13 1100  WBC 8.3  < > 9.3 9.8  NEUTROABS 4.9  --   --   --   HGB 16.1*  < >  15.1* 15.5*  HCT 44.5  < > 42.9 44.0  MCV 84.9  < > 86.5 86.8  PLT 259  < > 261 250  < > = values in this interval not displayed. Coagulation:   Recent Labs Lab 05/27/13 1724  LABPROT 12.2  INR 0.92   Cardiac Enzymes:   Recent Labs Lab 05/30/13 1100 05/30/13 1640 05/30/13 2000 05/31/13 0150  CKTOTAL 87 84  --   --   CKMB 1.0 1.0  --   --   TROPONINI <0.30 <0.30 <0.30 <0.30   Urinalysis:   Recent Labs Lab 05/28/13 0842 05/30/13 1249  COLORURINE YELLOW YELLOW  LABSPEC 1.037* 1.039*  PHURINE 5.0 5.0  GLUCOSEU >1000* >1000*  HGBUR NEGATIVE NEGATIVE  BILIRUBINUR NEGATIVE SMALL*  KETONESUR 15* 40*  PROTEINUR NEGATIVE NEGATIVE  UROBILINOGEN 0.2 0.2  NITRITE NEGATIVE NEGATIVE  LEUKOCYTESUR NEGATIVE NEGATIVE   Lipid Panel    Component Value Date/Time   CHOL 201* 05/28/2013 0300   TRIG 145 05/28/2013 0300   HDL 42 05/28/2013 0300   CHOLHDL 4.8 05/28/2013 0300   VLDL 29 05/28/2013 0300   LDLCALC 130* 05/28/2013 0300   HgbA1C  Lab Results  Component Value Date   HGBA1C 14.1* 05/30/2013    Urine Drug Screen:     Component Value Date/Time   LABOPIA NONE DETECTED 10/04/2009 0314   COCAINSCRNUR NONE DETECTED 10/04/2009 0314   LABBENZ NONE DETECTED 10/04/2009 0314   AMPHETMU NONE DETECTED 10/04/2009 0314   THCU NONE DETECTED 10/04/2009 0314   LABBARB  Value: NONE DETECTED        DRUG SCREEN FOR MEDICAL PURPOSES ONLY.  IF CONFIRMATION IS NEEDED FOR ANY PURPOSE, NOTIFY LAB WITHIN 5 DAYS.        LOWEST  DETECTABLE LIMITS FOR URINE DRUG SCREEN Drug Class       Cutoff (ng/mL) Amphetamine      1000 Barbiturate      200 Benzodiazepine   709 Tricyclics       628 Opiates          300 Cocaine          300 THC              50 10/04/2009 0314    Alcohol Level: No results found for this basename: ETH,  in the last 168 hours  Hypercoagulable Workup Normal - C3, C4, RPR,  HIV, homocysteine ESR - 25 (H) ; anti-thrombin 3 - 125 (H) ; protein C activity -  200 (H);  protein C total - 165 (H) cardiolipin antibodies - (L) Total Complement >60 (H)  Pending - CH50, ANA, Protein S activity, Protein S total, lupus anticoagulant,  ESR 25 (H) Anti III 125 (H)  CT of the brain  05/27/2013   No acute intracranial pathology.    MRI of the brain  05/27/2013     Multiple foci of acute ischemia within the left middle cerebral artery territory. In addition, however punctate focus of acute ischemia within the left occipital lobe and right thalamus (both of which would be posterior circulation). All areas of ischemia have similar acuity. No hemorrhagic conversion.  Symmetric abnormal bright signal within the lenticulostriate nuclei (basal ganglia) in addition to abnormal temporoccipital periventricular T2 hyperintense signal. These findings may reflect sequelae of acute severe hypertension (PRES) but, it can also be seen with osmotic demyelination, autoimmune disorders such as scleroderma (which can have associated vasculopathy), less likely infectious encephalitis.  Moderate to severe white matter changes, in a distribution which can  be seen with demyelination, with superimposed possible chronic small vessel ischemic disease though, are nonspecific.    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  Mild luminal irregularity of the intracranial vessels which can be seen with intracranial atherosclerosis though, may also be seen with vasculopathy.    2D Echocardiogram  EF 55-60% with no source of  embolus.   Carotid Doppler  No evidence of hemodynamically significant internal carotid artery stenosis. Vertebral artery flow is antegrade.   TEE  - When patient felt to be stable  EEG - This is an abnormal EEG secondary to slowing over the left hemisphere with frequent left hemispheric sharp acitivty as well.. This finding is suggestive of a focal disturbance that is etiologically nonspecific, but may include a mass lesion or post-ictal focal slowing among other possibilities.    CXR  05/27/2013    No radiographic evidence of an acute cardiopulmonary process.    EKG  sinus tachycardia. For complete results please see formal report.   Therapy Recommendations inpatient rehabilitation. Rehabilitation M.D. consult note pending.  Physical Exam   Middle aged  African american lady not in distress.Awake alert. Afebrile. Head is nontraumatic. Neck is supple without bruit.. Cardiac exam no murmur or gallop. Lungs are clear to auscultation. Distal pulses are well felt. Neurological Exam : Awake alert moderate expressive aphasia with significant word hesitancy and nonfluent speech. Unable to speak words and short sentences. Able to comprehend one  -step commands only. Unable to name or repeat. Extraocular movements are full range without nystagmus. Fundi were not visualized. Vision acuity seems adequate. Decreased blink to threat on the right compared to the left. Moderate right lower facial weakness. Tongue midline. Dense right upper extremity plegia with 0/5 strength and 2/5 strength in the right lower extremity and able to move it against gravity. Normal strength in the left side. Plantars both downgoing. Gait was not tested.  ASSESSMENT [name] is a 53 y.o. female presenting with difficulty speaking. 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  Infarcts felt to be embolic secondary  to unknown source.   On no antithrombotics prior to admission. Now on aspirin 324 mg chewable daily for secondary stroke prevention. Patient with resultant expressive and receptive aphasia, right field cut and right hemiplegia. Patient with progresive neuro worsening over the last 2 days when she was sat up at the bedside in the setting of relative hypotension for her blood pressure, now with R arm > leg hemiparesis; right arm plegic. Stroke work up underway.  LP done on arrival unrevealing.  Malignant hypertension - however neuro status declined with lower blood pressures. Diabetes, HgbA1c pending, goal < 7.0 -  hemoglobin A1c 14.6 Hyperlipidemia, LDL 130, on no statin PTA, now on zocor 20 mg daily, goal LDL < 70 for diabetics NPO - reevaluate swallowing - may need tube feedings. EEG - abnormal as above  Hospital day # 4  TREATMENT/PLAN  Continue aspirin for secondary stroke prevention.  TEE on hold until patient felt to be stable. F/u Hypercoagulable labs; results thus far unrevealing. -  ESR - 25 (H) ; anti-thrombin 3 - 125 (H) ; protein C activity -  200 (H);  protein C total - 165 (H) cardiolipin antibodies - (L) Total Complement >60 (H) Inpatient rehabilitation M.D. recommends inpatient rehabilitation. Keep systolic blood pressure in the 180-200 range has  the patient seems to be dependent on this pressure and has neurological worsening when blood pressure is close to the normal range Multiple discussions with family members at the bedside as well as with Dr. Chase Caller Ask speech to re- evaluate swallowing - may need tube feedings. Given the suspicion of cardiac embolic stroke, a loop recorder is suggested to evaluate for atrial fibrillation.  Mikey Bussing PA-C [facility] Pager [phone] 05/31/2013, 8:10 AM  This patient is critically ill and at significant risk of neurological worsening, death and care requires constant monitoring of vital signs,  hemodynamics,respiratory and cardiac monitoring,review of multiple databases, neurological assessment, discussion with family, other specialists and medical decision making of high complexity. I spent 40 minutes of neurocritical care time  in the care of  this patient. I have personally obtained a history, examined the patient, evaluated imaging results, and formulated the assessment and plan of care. I agree with the above.

## 2013-05-31 NOTE — Progress Notes (Signed)
PULMONARY / CRITICAL CARE MEDICINE   Name: [name] MRN: [id number] DOB: 1961-03-10    ADMISSION DATE:  05/27/2013   PRIMARY SERVICE: PCCM  CHIEF COMPLAINT:  HTN w/ difficulty speaking   BRIEF PATIENT DESCRIPTION:  This is a 53 year old female w/ known h/o HTN, presented to the ER 3/10 w/ right sided facial droop and asphasia. Happened at some point during day 3/10 (but unknown exact time). Last seen nml state: 0830 3/10. Initial CT negative. BP 205/121. Seen by Neurology. PCCM asked to admit to NICU for stroke eval.   LINES / TUBES:   CULTURES/autoimmune: 3/10 CSF>>> ------------------------------- HIV - NR  RPR - NR Antithrombin III - 125 (H) Homocysteine - wnl ANA>>> Protein C >> Protein S >> Lupus anticoag>> Prothrombin gene mut>>    SIGNIFICANT EVENTS / STUDIES:  CT head 3/10: neg for bleed  MRI/MRA brain 3/10>>>Multiple foci of acute ischemia within the left middle cerebral artery territory. In addition, however punctate focus of acute ischemia within the left occipital lobe and right thalamus (both of which would be posterior circulation). All areas of ischemia have similar acuity. No hemorrhagic conversion. 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 Mild luminal irregularity of the intracranial vessels which can be seen with intracranial atherosclerosis though, may also be seen with vasculopathy. ECHO 3/10>>>Left ventricle: The cavity size was normal. Systolicfunction was normal. The estimated ejection fraction was in the range of 55% to 60%. Wall motion was normal Carotid dopplers 3/10>>> LP by neuro 3/10>>> neg  TEE 3/11>>>  05/30/13: Got unresponsiv while sitting and waiting for SDU bed. Associated with slight decrease ni BP to ? 135 sbp and some labetalol. Rt side LE was flaccid. Seemed to improve a bit after bp rose.  CT shows stenotic M1 lesion in left MCA.  EEG left focal abnormality PCCM asked to sign back on and keep patient in  ICU   SUBJECTIVE/OVERNIGHT/INTERVAL HX  05/31/13: CT and EEG findings notes. Continued stability with bp being maintained high. AWak and alert and calm. Rt side still weak  VITAL SIGNS: Temp:  [97.8 F (36.6 C)-99.1 F (37.3 C)] 98.2 F (36.8 C) (03/14 0343) Pulse Rate:  [80-121] 102 (03/14 0800) Resp:  [15-23] 20 (03/14 0800) BP: (135-206)/(88-122) 187/115 mmHg (03/14 0800) SpO2:  [91 %-100 %] 97 % (03/14 0800) HEMODYNAMICS:   VENTILATOR SETTINGS:   INTAKE / OUTPUT: Intake/Output     03/13 0701 - 03/14 0700 03/14 0701 - 03/15 0700   I.V. (mL/kg) 2400 (32.8) 200 (2.7)   NG/GT 30    IV Piggyback 50    Total Intake(mL/kg) 2480 (33.9) 200 (2.7)   Urine (mL/kg/hr) 1150 (0.7) 350 (1.7)   Total Output 1150 350   Net +1330 -150        Urine Occurrence 3 x      PHYSICAL EXAMINATION: General:Awake Neuro:  Awake, alert. RUE weakness, Right sided facial droop. Opens mouth to command. RUE 1/5, RLE initially 1-2 HEENT:  Central, no JVD, right droop   Cardiovascular:  rrr Lungs:  Clear  Abdomen:  Soft, non tender  Musculoskeletal:  Intact, no edema  Skin:  Intact   LABS:  PULMONARY  Recent Labs Lab 05/30/13 1052  PHART 7.443  PCO2ART 32.5*  PO2ART 260.0*  HCO3 22.2  TCO2 23  O2SAT 100.0    CBC  Recent Labs Lab 05/27/13 2332 05/30/13 0243 05/30/13 1100  HGB 15.1* 15.1* 15.5*  HCT 42.5 42.9 44.0  WBC 8.3 9.3 9.8  PLT 267 261  250    COAGULATION  Recent Labs Lab 05/27/13 1724  INR 0.92    CARDIAC    Recent Labs Lab 05/30/13 1100 05/30/13 1640 05/30/13 2000 05/31/13 0150  TROPONINI <0.30 <0.30 <0.30 <0.30   No results found for this basename: PROBNP,  in the last 168 hours   CHEMISTRY  Recent Labs Lab 05/27/13 1724 05/29/13 2104 05/30/13 0243 05/30/13 0755 05/30/13 1100 05/30/13 1352 05/30/13 2000 05/31/13 0150  NA 135* 146 142 139 139 138 139 140  K 3.7  --  3.8  --  4.1  --   --   --   CL 94*  --  101  --  100  --   --   --   CO2  24  --  23  --  19  --   --   --   GLUCOSE 353*  --  213*  --  260*  --   --   --   BUN 10  --  7  --  7  --   --   --   CREATININE 0.73  --  0.67  --  0.66  --   --   --   CALCIUM 9.7  --  8.9  --  9.1  --   --   --   MG  --   --  1.8  --   --   --   --   --   PHOS  --   --  3.5  --   --   --   --   --    Estimated Creatinine Clearance: 86.2 ml/min (by C-G formula based on Cr of 0.66).   LIVER  Recent Labs Lab 05/27/13 1724 05/30/13 1100  AST 16 25  ALT 14 11  ALKPHOS 120* 98  BILITOT 0.4 0.4  PROT 8.8* 7.3  ALBUMIN 4.5 3.3*  INR 0.92  --      INFECTIOUS  Recent Labs Lab 05/30/13 1100 05/30/13 1135 05/31/13 0150  LATICACIDVEN  --  1.5  --   PROCALCITON 0.11  --  <0.10     ENDOCRINE CBG (last 3)   Recent Labs  05/31/13 0018 05/31/13 0342 05/31/13 0739  GLUCAP 145* 142* 153*         IMAGING x48h  Ct Angio Head W/cm &/or Wo Cm  05/30/2013   CLINICAL DATA:  Stroke.  EXAM: CT ANGIOGRAPHY HEAD  TECHNIQUE: Multidetector CT imaging of the head was performed using the standard protocol during bolus administration of intravenous contrast. Multiplanar CT image reconstructions and MIPs were obtained to evaluate the vascular anatomy.  CONTRAST:  17mL OMNIPAQUE IOHEXOL 350 MG/ML SOLN  COMPARISON:  Head CT 05/29/2013.  MRI 05/27/2013.  FINDINGS: Head CT images show areas of low-density within the left frontoparietal region, basal ganglia and insular region consistent with recent infarctions. There is low density in the pons that appears more prominent than was seen previously and there could be recent brainstem insult. No hemorrhage or herniation.  Both internal carotid arteries are patent through the skullbase. There is atherosclerotic disease in the carotid siphon regions but no flow-limiting stenosis. Both supra clinoid internal carotid arteries show moderate narrowing, 30-50%.  On the right, the anterior and middle cerebral arteries are patent without proximal  stenosis. Distal branch vessels appear normally opacified. On the left, there is normal appearing flow within the left anterior cerebral artery territory. There is severe stenosis in the M1 segment on the left with markedly  diminished opacification of the more distal left MCA branch vessels.  Both vertebral arteries are patent to the basilar. No basilar stenosis. Posterior circulation branch vessels are patent. There is atherosclerotic irregularity of the posterior cerebral arteries and superior cerebellar arteries.  Review of the MIP images confirms the above findings.  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 No other intracranial flow-limiting stenosis or embolic disease evident.  Areas of low density in the left MCA territory consistent with infarction. No hemorrhagic transformation. Question new low density in the pons, not definite.   Electronically Signed   By: Nelson Chimes M.D.   On: 05/30/2013 12:19   Dg Abd 1 View  05/30/2013   CLINICAL DATA:  NG tube placement  EXAM: ABDOMEN - 1 VIEW  COMPARISON:  None.  FINDINGS: The bowel gas pattern is normal. Residual contrast is identified in the renal calices. A nasogastric tube is identified coiled in the stomach with distal tip in the mid esophagus. Repositioning is recommended  IMPRESSION: A nasogastric tube is identified coiled in the stomach with distal tip in the mid esophagus. Repositioning is recommended   Electronically Signed   By: Abelardo Diesel M.D.   On: 05/30/2013 13:24   Ct Head Wo Contrast  05/29/2013   CLINICAL DATA:  Worsening headache.  EXAM: CT HEAD WITHOUT CONTRAST  TECHNIQUE: Contiguous axial images were obtained from the base of the skull through the vertex without intravenous contrast.  COMPARISON:  CT scan of May 29, 2013.  FINDINGS: Bony calvarium appears intact. No midline shift is noted. Ventricular size is within  normal limits. 15 x 12 mm rounded low density is noted in left basal ganglia consistent with infarction. 22 x 20 mm rounded low density is noted in left periventricular white matter consistent with infarction. Stable left frontal lobe density is noted as well. These are unchanged compared to prior exam. There is no evidence of hemorrhage or mass lesion.  IMPRESSION: Stable left-sided infarctions are noted. No significant changes noted compared to prior exam.   Electronically Signed   By: Sabino Dick M.D.   On: 05/29/2013 20:22   Ct Head Wo Contrast  05/29/2013   CLINICAL DATA:  Right-sided weakness  EXAM: CT HEAD WITHOUT CONTRAST  TECHNIQUE: Contiguous axial images were obtained from the base of the skull through the vertex without intravenous contrast.  COMPARISON:  MR HEAD W/O CM dated 05/27/2013; MR MRA HEAD W/O CM dated 05/27/2013; CT HEAD W/O CM dated 05/27/2013; CT HEAD W/O CM dated 10/04/2009  FINDINGS: There is no evidence of mass effect, midline shift or extra-axial fluid collections. There is no evidence of a space-occupying lesion or intracranial hemorrhage. There are areas of low attenuation in the left frontal lobe, left basal ganglia, left insula and small foci in the left parietal lobe consistent with multiple infarcts.  The ventricles and sulci are appropriate for the patient's age. The basal cisterns are patent.  Visualized portions of the orbits are unremarkable. The visualized portions of the paranasal sinuses and mastoid air cells are unremarkable.  The osseous structures are unremarkable.  IMPRESSION: Involving multifocal left MCA territory nonhemorrhagic infarcts. The area of low-attenuation the left basal ganglia and left insula are larger compared with prior exams.   Electronically Signed   By: Kathreen Devoid   On: 05/29/2013 13:57       ASSESSMENT / PLAN: Acute CVA: multiple bilateral, most  in the R MCA but also R occipital and L thalamus infarcts w/ resultant expressive asphasia and  right facial droop.    -  current thought is relative hypotension + severe stenosis of M1 of Left MCA  - prior though of malignant hypertension or embolic phenomeonon thought less likely  -  P:   Allow hypertension; no drugs. Call md when bp > 200 sbp. Looks like 170 is lowest patient can handle TEE to r/o embolic source when stable; neuro to decide if still needed hypercoag panel pending as above  Cont asa  Risk factor modification  PT consult, OT consult, Speech consult   Per neuro  PULMONARY A:at risk for intubation - decreasing risk P:   Npo Intubate if mental status worsesn  CARDIOVASCULAR A: concern for embolic stroke - less likely P:  TEE when stable; neuro to decide if still needed   RENAL A:  Normal P:   monitor  GASTROINTESTINAL A:  AT risk for aspiration P:   Npo inclding meds. Needs feeding tube  HEMATOLOGIC A:  Chronic mild polycythemia not relatd to smoking nos P:  monitor  INFECTIOUS A:  AT risk for aspiration Concern for endocarditis - low  - sepsis markers negative P:   Hydrate Monitor off abx  ENDOCRINE A:  Hx of DM. HgbA1C 14.6 P:   ssi   TODAY'S SUMMARY: PCCM primary. Marland Kitchen Keep in ICU. Neuro to decide tx timing    The patient is critically ill with multiple organ systems failure and requires high complexity decision making for assessment and support, frequent evaluation and titration of therapies, application of advanced monitoring technologies and extensive interpretation of multiple databases.   Critical Care Time devoted to patient care services described in this note is  35  Minutes.  Dr. Brand Males, M.D., [facility].C.P Pulmonary and Critical Care Medicine Staff Physician [facility] Pager: [phone], If no answer or between  15:00h - 7:00h: call [phone]     05/31/2013 9:50 AM

## 2013-05-31 NOTE — Evaluation (Signed)
Clinical/Bedside Swallow Evaluation Patient Details  Name: KRYSTIE LEITER MRN: [id number] Date of Birth: Dec 15, 1960  Today's Date: 05/31/2013 Time: 32-1440 SLP Time Calculation (min): 25 min  Past Medical History:  Past Medical History  Diagnosis Date  . Hypertension   . Depression   . Diabetes mellitus     ?type (05/29/2013)  . Bipolar affective disorder   . Schizophrenia     [name] 11/28/2007 (05/29/2013)  . Stroke 05/27/2013   Past Surgical History:  Past Surgical History  Procedure Laterality Date  . Cesarean section  ~ 1987  . Tubal ligation     HPI:  53 y.o. female presenting with difficulty speaking. Per MRI: Multiple foci of acute ischemia within the left middle cerebral artery territory. In addition, however punctate focus of acute ischemia within the left occipital lobe and right thalamus (both of which would be posterior circulation). Currently on NG tube for feeding.   Assessment / Plan / Recommendation Clinical Impression  Pt presents with overt s/s of aspiration with both oral and pharyngeal implications. With initial trials of thin, pt made obvious attempts to initiate swallow but then spit out. Was able to trigger with ice chips, and then had more success with small sips of thin. When she did swallow, it was significantly delayed. Swallowed initial bolus of puree but then unable to trigger; required suction. Given this, pt is at significant risk of aspiration of all consistencies. Rx continuing NPO; also provide ice chips following oral care to give opportunities to swallow with minimal risk of aspiration of bacteria. Speech will continue to follow for PO trials.    Aspiration Risk  Severe    Diet Recommendation NPO;Ice chips PRN after oral care   Medication Administration: Via alternative means Supervision: Full supervision/cueing for compensatory strategies (with ice chips) Compensations: Check for pocketing;Check for anterior loss (with ice chips)    Other   Recommendations Oral Care Recommendations: Oral care prior to ice chips;Oral care Q4 per protocol   Follow Up Recommendations  Inpatient Rehab    Frequency and Duration min 2x/week  2 weeks   Pertinent Vitals/Pain n/a    SLP Swallow Goals     Swallow Study Prior Functional Status       General HPI: 53 y.o. female presenting with difficulty speaking. Per MRI: Multiple foci of acute ischemia within the left middle cerebral artery territory. In addition, however punctate focus of acute ischemia within the left occipital lobe and right thalamus (both of which would be posterior circulation). Currently on NG tube for feeding. Type of Study: Bedside swallow evaluation Diet Prior to this Study: NPO Temperature Spikes Noted: No Respiratory Status: Room air History of Recent Intubation: No Behavior/Cognition: Alert;Cooperative;Requires cueing Oral Cavity - Dentition: Adequate natural dentition Self-Feeding Abilities: Needs assist Patient Positioning: Upright in bed Baseline Vocal Quality: Other (comment) (unable to initiate vocalization) Volitional Cough: Cognitively unable to elicit Volitional Swallow: Unable to elicit    Oral/Motor/Sensory Function Overall Oral Motor/Sensory Function: Impaired Labial ROM: Reduced right Labial Symmetry: Abnormal symmetry right Labial Strength: Reduced Labial Sensation: Reduced Lingual ROM: Reduced right Lingual Symmetry: Abnormal symmetry right Lingual Sensation: Reduced   Ice Chips Ice chips: Impaired Presentation: Spoon Pharyngeal Phase Impairments: Suspected delayed Swallow   Thin Liquid Thin Liquid: Impaired Presentation: Cup;Straw Oral Phase Impairments: Reduced labial seal;Impaired anterior to posterior transit;Poor awareness of bolus Oral Phase Functional Implications: Right anterior spillage;Left anterior spillage;Oral holding;Oral residue Pharyngeal  Phase Impairments: Suspected delayed Swallow;Unable to trigger swallow;Other  (comments) (inconsistent- triggered swallow  50% of trials of thin)    Nectar Thick Nectar Thick Liquid: Not tested   Honey Thick Honey Thick Liquid: Not tested   Puree Puree: Impaired Presentation: Spoon Oral Phase Impairments: Poor awareness of bolus;Impaired anterior to posterior transit;Reduced lingual movement/coordination Oral Phase Functional Implications: Prolonged oral transit;Oral holding;Oral residue Pharyngeal Phase Impairments: Suspected delayed Swallow;Multiple swallows   Solid   GO    Solid: Not tested       Gilmore Laroche, Amy K, MA, CCC-SLP 05/31/2013,2:48 PM

## 2013-06-01 DIAGNOSIS — E119 Type 2 diabetes mellitus without complications: Secondary | ICD-10-CM

## 2013-06-01 LAB — BASIC METABOLIC PANEL
BUN: 10 mg/dL (ref 6–23)
CO2: 18 meq/L — AB (ref 19–32)
CREATININE: 0.66 mg/dL (ref 0.50–1.10)
Calcium: 8.8 mg/dL (ref 8.4–10.5)
Chloride: 102 mEq/L (ref 96–112)
GFR calc Af Amer: >90 mL/min (ref >90)
GFR calc non Af Amer: >90 mL/min (ref >90)
GLUCOSE: 157 mg/dL — AB (ref 70–99)
Potassium: 4 mEq/L (ref 3.7–5.3)
Sodium: 138 mEq/L (ref 137–147)

## 2013-06-01 LAB — GLUCOSE, CAPILLARY
GLUCOSE-CAPILLARY: 149 mg/dL — AB (ref 70–99)
GLUCOSE-CAPILLARY: 170 mg/dL — AB (ref 70–99)
Glucose-Capillary: 149 mg/dL — ABNORMAL HIGH (ref 70–99)
Glucose-Capillary: 153 mg/dL — ABNORMAL HIGH (ref 70–99)
Glucose-Capillary: 153 mg/dL — ABNORMAL HIGH (ref 70–99)
Glucose-Capillary: 200 mg/dL — ABNORMAL HIGH (ref 70–99)

## 2013-06-01 LAB — CBC WITH DIFFERENTIAL/PLATELET
Basophils Absolute: 0 10*3/uL (ref 0.0–0.1)
Basophils Relative: 0 % (ref 0–1)
EOS ABS: 0.1 10*3/uL (ref 0.0–0.7)
Eosinophils Relative: 1 % (ref 0–5)
HEMATOCRIT: 40.5 % (ref 36.0–46.0)
HEMOGLOBIN: 14.1 g/dL (ref 12.0–15.0)
LYMPHS ABS: 2.5 10*3/uL (ref 0.7–4.0)
Lymphocytes Relative: 25 % (ref 12–46)
MCH: 30.7 pg (ref 26.0–34.0)
MCHC: 34.8 g/dL (ref 30.0–36.0)
MCV: 88 fL (ref 78.0–100.0)
MONOS PCT: 8 % (ref 3–12)
Monocytes Absolute: 0.8 10*3/uL (ref 0.1–1.0)
Neutro Abs: 6.5 10*3/uL (ref 1.7–7.7)
Neutrophils Relative %: 66 % (ref 43–77)
Platelets: 238 10*3/uL (ref 150–400)
RBC: 4.6 MIL/uL (ref 3.87–5.11)
RDW: 12.2 % (ref 11.5–15.5)
WBC: 9.8 10*3/uL (ref 4.0–10.5)

## 2013-06-01 LAB — MAGNESIUM: Magnesium: 1.7 mg/dL (ref 1.5–2.5)

## 2013-06-01 LAB — PHOSPHORUS: Phosphorus: 4.2 mg/dL (ref 2.3–4.6)

## 2013-06-01 LAB — PROCALCITONIN

## 2013-06-01 MED ORDER — MORPHINE SULFATE 2 MG/ML IJ SOLN
2.0000 mg | INTRAMUSCULAR | Status: DC | PRN
  Administered 2013-06-01 – 2013-06-03 (×2): 2 mg via INTRAVENOUS
  Filled 2013-06-01 (×2): qty 1

## 2013-06-01 MED ORDER — VITAL HIGH PROTEIN PO LIQD: 1000.0000 mL | ORAL | Status: DC

## 2013-06-01 MED ORDER — ALUM & MAG HYDROXIDE-SIMETH 200-200-20 MG/5ML PO SUSP
30.0000 mL | ORAL | Status: DC | PRN
  Administered 2013-06-01: 30 mL via ORAL
  Filled 2013-06-01: qty 30

## 2013-06-01 MED ORDER — VITAL AF 1.2 CAL PO LIQD
1000.0000 mL | ORAL | Status: DC
  Administered 2013-06-01: 1000 mL
  Filled 2013-06-01 (×4): qty 1000

## 2013-06-01 MED ORDER — SIMVASTATIN 20 MG PO TABS
20.0000 mg | ORAL_TABLET | Freq: Every day | ORAL | Status: DC
  Administered 2013-06-01 – 2013-06-02 (×2): 20 mg
  Filled 2013-06-01 (×4): qty 1

## 2013-06-01 MED ORDER — MAGNESIUM SULFATE 40 MG/ML IJ SOLN
2.0000 g | Freq: Once | INTRAMUSCULAR | Status: AC
  Administered 2013-06-01: 2 g via INTRAVENOUS
  Filled 2013-06-01: qty 50

## 2013-06-01 NOTE — Progress Notes (Signed)
Stroke Team Progress Note  HISTORY [name] is a 53 y.o. female with a history of hypertension and diabetes who presented 05/27/2013 with difficulty speaking that started at some point on the day of admission. She did not notice it until after work, when she was speaking with her daughter. She did not speak to anyone at work that day.. The last time that she spoke to someone was when she was leaving for work around 8:30 AM. Patient was not administerd TPA secondary to delay in arrival. She was considered for enrollment in [facility].  She was admitted to the ICU for further evaluation and treatment.  SUBJECTIVE  Overall she is unchanged. She has had some difficulty with swallowing currently being reevaluated by swallowing therapist. Apparently she has been holding her food without swallowing.   OBJECTIVE Most recent Vital Signs: Filed Vitals:   06/01/13 0500 06/01/13 0530 06/01/13 0600 06/01/13 0741  BP:  189/123 210/120   Pulse:      Temp:    98.5 F (36.9 C)  TempSrc:    Oral  Resp: _0 Height:      Weight:      SpO2:       CBG (last 3)   Recent Labs  06/01/13 0022 06/01/13 0417 06/01/13 0732  GLUCAP 153* 149* 153*    IV Fluid Intake:   . sodium chloride 100 mL/hr at 05/31/13 0146    MEDICATIONS  . aspirin  300 mg Rectal Daily  . heparin  5,000 Units Subcutaneous 3 times per day  . insulin aspart  0-15 Units Subcutaneous 6 times per day  . nystatin  5 mL Oral QID   PRN:  morphine injection, ondansetron (ZOFRAN) IV  Diet:  NPO no liquids Activity:  Bedrest - increase activity DVT Prophylaxis:  Heparin 5000 units sq tid   CLINICALLY SIGNIFICANT STUDIES Basic Metabolic Panel:   Recent Labs Lab 05/30/13 0243  05/30/13 1100  05/31/13 0923 06/01/13 0250  NA 142  < > 139  < > 140 138  K 3.8  --  4.1  --   --  4.0  CL 101  --  100  --   --  102  CO2 23  --  19  --   --  18*  GLUCOSE 213*  --  260*  --   --  157*  BUN 7  --  7  --   --  10   CREATININE 0.67  --  0.66  --   --  0.66  CALCIUM 8.9  --  9.1  --   --  8.8  MG 1.8  --   --   --   --  1.7  PHOS 3.5  --   --   --   --  4.2  < > = values in this interval not displayed. Liver Function Tests:   Recent Labs Lab 05/27/13 1724 05/30/13 1100  AST 16 25  ALT 14 11  ALKPHOS 120* 98  BILITOT 0.4 0.4  PROT 8.8* 7.3  ALBUMIN 4.5 3.3*   CBC:   Recent Labs Lab 05/27/13 1724  05/30/13 1100 06/01/13 0250  WBC 8.3  < > 9.8 9.8  NEUTROABS 4.9  --   --  6.5  HGB 16.1*  < > 15.5* 14.1  HCT 44.5  < > 44.0 40.5  MCV 84.9  < > 86.8 88.0  PLT 259  < > 250 238  < > = values in  this interval not displayed. Coagulation:   Recent Labs Lab 05/27/13 1724  LABPROT 12.2  INR 0.92   Cardiac Enzymes:   Recent Labs Lab 05/30/13 1100 05/30/13 1640 05/30/13 2000 05/31/13 0150  CKTOTAL 87 84  --   --   CKMB 1.0 1.0  --   --   TROPONINI <0.30 <0.30 <0.30 <0.30   Urinalysis:   Recent Labs Lab 05/28/13 0842 05/30/13 1249  COLORURINE YELLOW YELLOW  LABSPEC 1.037* 1.039*  PHURINE 5.0 5.0  GLUCOSEU >1000* >1000*  HGBUR NEGATIVE NEGATIVE  BILIRUBINUR NEGATIVE SMALL*  KETONESUR 15* 40*  PROTEINUR NEGATIVE NEGATIVE  UROBILINOGEN 0.2 0.2  NITRITE NEGATIVE NEGATIVE  LEUKOCYTESUR NEGATIVE NEGATIVE   Lipid Panel    Component Value Date/Time   CHOL 201* 05/28/2013 0300   TRIG 145 05/28/2013 0300   HDL 42 05/28/2013 0300   CHOLHDL 4.8 05/28/2013 0300   VLDL 29 05/28/2013 0300   LDLCALC 130* 05/28/2013 0300   HgbA1C  Lab Results  Component Value Date   HGBA1C 14.1* 05/30/2013    Urine Drug Screen:     Component Value Date/Time   LABOPIA NONE DETECTED 10/04/2009 0314   COCAINSCRNUR NONE DETECTED 10/04/2009 0314   LABBENZ NONE DETECTED 10/04/2009 0314   AMPHETMU NONE DETECTED 10/04/2009 0314   THCU NONE DETECTED 10/04/2009 0314   LABBARB  Value: NONE DETECTED        DRUG SCREEN FOR MEDICAL PURPOSES ONLY.  IF CONFIRMATION IS NEEDED FOR ANY PURPOSE, NOTIFY LAB WITHIN 5  DAYS.        LOWEST DETECTABLE LIMITS FOR URINE DRUG SCREEN Drug Class       Cutoff (ng/mL) Amphetamine      1000 Barbiturate      200 Benzodiazepine   322 Tricyclics       025 Opiates          300 Cocaine          300 THC              50 10/04/2009 0314    Alcohol Level: No results found for this basename: ETH,  in the last 168 hours  Hypercoagulable Workup Normal - C3, C4, RPR,  HIV, homocysteine ESR - 25 (H) ; anti-thrombin 3 - 125 (H) ; protein C activity -  200 (H);  protein C total - 165 (H) cardiolipin antibodies - (L) Total Complement >60 (H)  Pending - CH50, ANA, Protein S activity, Protein S total, lupus anticoagulant,  ESR 25 (H) Anti III 125 (H)  CT of the brain  05/27/2013   No acute intracranial pathology.    MRI of the brain  05/27/2013     Multiple foci of acute ischemia within the left middle cerebral artery territory. In addition, however punctate focus of acute ischemia within the left occipital lobe and right thalamus (both of which would be posterior circulation). All areas of ischemia have similar acuity. No hemorrhagic conversion.  Symmetric abnormal bright signal within the lenticulostriate nuclei (basal ganglia) in addition to abnormal temporoccipital periventricular T2 hyperintense signal. These findings may reflect sequelae of acute severe hypertension (PRES) but, it can also be seen with osmotic demyelination, autoimmune disorders such as scleroderma (which can have associated vasculopathy), less likely infectious encephalitis.  Moderate to severe white matter changes, in a distribution which can be seen with demyelination, with superimposed possible chronic small vessel ischemic disease though, are nonspecific.    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  Mild luminal irregularity of the intracranial vessels which can be seen with intracranial atherosclerosis though, may also be seen with vasculopathy.    2D Echocardiogram  EF 55-60%  with no source of embolus.   Carotid Doppler  No evidence of hemodynamically significant internal carotid artery stenosis. Vertebral artery flow is antegrade.   TEE  - needs to arrange.   EEG - This is an abnormal EEG secondary to slowing over the left hemisphere with frequent left hemispheric sharp acitivty as well.. This finding is suggestive of a focal disturbance that is etiologically nonspecific, but may include a mass lesion or post-ictal focal slowing among other possibilities.    CXR  05/27/2013    No radiographic evidence of an acute cardiopulmonary process.    EKG  sinus tachycardia. For complete results please see formal report.   Therapy Recommendations inpatient rehabilitation. Rehabilitation M.D. consult note pending.  Physical Exam   Middle aged  African american lady not in distress.Awake alert. Afebrile. Head is nontraumatic. Neck is supple without bruit.. Cardiac exam no murmur or gallop. Lungs are clear to auscultation. Distal pulses are well felt. Neurological Exam : Awake alert moderate expressive aphasia with significant word hesitancy and nonfluent speech. Unable to speak words and short sentences. Able to comprehend one  -step commands only. Unable to name or repeat. Extraocular movements are full range without nystagmus. Fundi were not visualized. Vision acuity seems adequate. Decreased blink to threat on the right compared to the left. Moderate right lower facial weakness. Tongue midline. Dense right upper extremity plegia with 0/5 strength and 2/5 strength in the right lower extremity and able to move it against gravity. Normal strength in the left side. Plantars both downgoing. Gait was not tested.  ASSESSMENT [name] is a 53 y.o. female presenting with difficulty speaking. 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  Infarcts felt to be embolic  secondary to unknown source.   On no antithrombotics prior to admission. Now on aspirin 324 mg chewable daily for secondary stroke prevention. Patient with resultant expressive and receptive aphasia, right field cut and right hemiplegia. Patient with progresive neuro worsening over the last 2 days when she was sat up at the bedside in the setting of relative hypotension for her blood pressure, now with R arm > leg hemiparesis; right arm plegic. Stroke work up underway.  LP done on arrival unrevealing.  Malignant hypertension - however neuro status declined with lower blood pressures. Diabetes, HgbA1c pending, goal < 7.0 -  hemoglobin A1c 14.6 Hyperlipidemia, LDL 130, on no statin PTA, now on zocor 20 mg daily, goal LDL < 70 for diabetics NPO - reevaluate swallowing - may need tube feedings. EEG - abnormal as above  Hospital day # 5  TREATMENT/PLAN  Continue aspirin for secondary stroke prevention. TEE - Monday / Tuesday. Need to consult cardiology. F/u Hypercoagulable labs; results thus far unrevealing. -  ESR - 25 (H) ; anti-thrombin 3 - 125 (H) ; protein C activity -  200 (H);  protein C total - 165 (H) cardiolipin antibodies - (L) Total Complement >60 (H) Inpatient rehabilitation M.D. recommends inpatient rehabilitation. Keep systolic blood pressure in the 180-200 range has the patient seems to be dependent on this pressure and has neurological worsening when blood pressure is close to the normal range Multiple discussions with family members at the bedside as well as with Dr. Chase Caller  Ask speech to re- evaluate swallowing - may need tube feedings. Given the suspicion of cardiac embolic stroke, a loop recorder is suggested to evaluate for atrial fibrillation.  Mikey Bussing PA-C [facility] Pager [phone] 06/01/2013, 7:53 AM  This patient is critically ill and at significant risk of neurological worsening, death and care requires constant monitoring of vital signs,  hemodynamics,respiratory and cardiac monitoring,review of multiple databases, neurological assessment, discussion with family, other specialists and medical decision making of high complexity. I spent 40 minutes of neurocritical care time  in the care of  this patient. I have personally obtained a history, examined the patient, evaluated imaging results, and formulated the assessment and plan of care. I agree with the above.

## 2013-06-01 NOTE — Progress Notes (Addendum)
PULMONARY / CRITICAL CARE MEDICINE   Name: [name] MRN: [id number] DOB: 1960-11-27    ADMISSION DATE:  05/27/2013   PRIMARY SERVICE: PCCM  CHIEF COMPLAINT:  HTN w/ difficulty speaking   BRIEF PATIENT DESCRIPTION:  This is a 53 year old female w/ known h/o HTN, presented to the ER 3/10 w/ right sided facial droop and asphasia. Happened at some point during day 3/10 (but unknown exact time). Last seen nml state: 0830 3/10. Initial CT negative. BP 205/121. Seen by Neurology. PCCM asked to admit to NICU for stroke eval.   LINES / TUBES:   CULTURES/autoimmune: 3/10 CSF>>> ------------------------------- HIV - NR  RPR - NR Antithrombin III - 125 (H) Homocysteine - wnl ANA>>> Protein C >> Protein S >> Lupus anticoag>> Prothrombin gene mut>>    SIGNIFICANT EVENTS / STUDIES:  CT head 3/10: neg for bleed  MRI/MRA brain 3/10>>>Multiple foci of acute ischemia within the left middle cerebral artery territory. In addition, however punctate focus of acute ischemia within the left occipital lobe and right thalamus (both of which would be posterior circulation). All areas of ischemia have similar acuity. No hemorrhagic conversion. 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 Mild luminal irregularity of the intracranial vessels which can be seen with intracranial atherosclerosis though, may also be seen with vasculopathy. ECHO 3/10>>>Left ventricle: The cavity size was normal. Systolicfunction was normal. The estimated ejection fraction was in the range of 55% to 60%. Wall motion was normal Carotid dopplers 3/10>>> LP by neuro 3/10>>> neg  TEE 3/11>>>  05/30/13: Got unresponsiv while sitting and waiting for SDU bed. Associated with slight decrease ni BP to ? 135 sbp and some labetalol. Rt side LE was flaccid. Seemed to improve a bit after bp rose.  CT shows stenotic M1 lesion in left MCA.  EEG left focal abnormality PCCM asked to sign back on and keep patient in ICU  05/31/13: CT and  EEG findings notes. Continued stability with bp being maintained high. AWak and alert and calm. Rt side still weak   SUBJECTIVE/OVERNIGHT/INTERVAL HX 3./15/15: Failed swallow. Can have ice chips. No other change. BP continue to remail appropriately high at > 180 sbp. Follows commands. Rt side still weak: ? Better  VITAL SIGNS: Temp:  [98 F (36.7 C)-99.7 F (37.6 C)] 98.5 F (36.9 C) (03/15 0741) Pulse Rate:  [98-118] 117 (03/15 0430) Resp:  [17-25] 17 (03/15 0600) BP: (181-216)/(104-124) 210/120 mmHg (03/15 0600) SpO2:  [96 %-100 %] 98 % (03/15 0430) Weight:  [73.6 kg (162 lb 4.1 oz)] 73.6 kg (162 lb 4.1 oz) (03/15 0433) HEMODYNAMICS:   VENTILATOR SETTINGS:   INTAKE / OUTPUT: Intake/Output     03/14 0701 - 03/15 0700 03/15 0701 - 03/16 0700   I.V. (mL/kg) 2300 (31.3)    NG/GT     IV Piggyback     Total Intake(mL/kg) 2300 (31.3)    Urine (mL/kg/hr) 2185 (1.2)    Total Output 2185     Net +115            PHYSICAL EXAMINATION: General:Awake Neuro:  Awake, alert. RUE weakness, Right sided facial droop. Opens mouth to command. RUE 1/5, RLE  1-2/5 HEENT:  [place], no JVD, right droop   Cardiovascular:  rrr Lungs:  Clear  Abdomen:  Soft, non tender  Musculoskeletal:  Intact, no edema  Skin:  Intact   LABS:  PULMONARY  Recent Labs Lab 05/30/13 1052  PHART 7.443  PCO2ART 32.5*  PO2ART 260.0*  HCO3 22.2  TCO2 23  O2SAT 100.0    CBC  Recent Labs Lab 05/30/13 0243 05/30/13 1100 06/01/13 0250  HGB 15.1* 15.5* 14.1  HCT 42.9 44.0 40.5  WBC 9.3 9.8 9.8  PLT 261 250 238    COAGULATION  Recent Labs Lab 05/27/13 1724  INR 0.92    CARDIAC    Recent Labs Lab 05/30/13 1100 05/30/13 1640 05/30/13 2000 05/31/13 0150  TROPONINI <0.30 <0.30 <0.30 <0.30   No results found for this basename: PROBNP,  in the last 168 hours   CHEMISTRY  Recent Labs Lab 05/27/13 1724 05/29/13 2104 05/30/13 0243  05/30/13 1100 05/30/13 1352 05/30/13 2000  05/31/13 0150 05/31/13 0923 06/01/13 0250  NA 135* 146 142  < > 139 138 139 140 140 138  K 3.7  --  3.8  --  4.1  --   --   --   --  4.0  CL 94*  --  101  --  100  --   --   --   --  102  CO2 24  --  23  --  19  --   --   --   --  18*  GLUCOSE 353*  --  213*  --  260*  --   --   --   --  157*  BUN 10  --  7  --  7  --   --   --   --  10  CREATININE 0.73  --  0.67  --  0.66  --   --   --   --  0.66  CALCIUM 9.7  --  8.9  --  9.1  --   --   --   --  8.8  MG  --   --  1.8  --   --   --   --   --   --  1.7  PHOS  --   --  3.5  --   --   --   --   --   --  4.2  < > = values in this interval not displayed. Estimated Creatinine Clearance: 86.3 ml/min (by C-G formula based on Cr of 0.66).   LIVER  Recent Labs Lab 05/27/13 1724 05/30/13 1100  AST 16 25  ALT 14 11  ALKPHOS 120* 98  BILITOT 0.4 0.4  PROT 8.8* 7.3  ALBUMIN 4.5 3.3*  INR 0.92  --      INFECTIOUS  Recent Labs Lab 05/30/13 1100 05/30/13 1135 05/31/13 0150  LATICACIDVEN  --  1.5  --   PROCALCITON 0.11  --  <0.10     ENDOCRINE CBG (last 3)   Recent Labs  06/01/13 0022 06/01/13 0417 06/01/13 0732  GLUCAP 153* 149* 153*         IMAGING x48h  Ct Angio Head W/cm &/or Wo Cm  05/30/2013   CLINICAL DATA:  Stroke.  EXAM: CT ANGIOGRAPHY HEAD  TECHNIQUE: Multidetector CT imaging of the head was performed using the standard protocol during bolus administration of intravenous contrast. Multiplanar CT image reconstructions and MIPs were obtained to evaluate the vascular anatomy.  CONTRAST:  60mL OMNIPAQUE IOHEXOL 350 MG/ML SOLN  COMPARISON:  Head CT 05/29/2013.  MRI 05/27/2013.  FINDINGS: Head CT images show areas of low-density within the left frontoparietal region, basal ganglia and insular region consistent with recent infarctions. There is low density in the pons that appears more prominent than was seen previously and there could be recent brainstem insult. No  hemorrhage or herniation.  Both internal carotid  arteries are patent through the skullbase. There is atherosclerotic disease in the carotid siphon regions but no flow-limiting stenosis. Both supra clinoid internal carotid arteries show moderate narrowing, 30-50%.  On the right, the anterior and middle cerebral arteries are patent without proximal stenosis. Distal branch vessels appear normally opacified. On the left, there is normal appearing flow within the left anterior cerebral artery territory. There is severe stenosis in the M1 segment on the left with markedly diminished opacification of the more distal left MCA branch vessels.  Both vertebral arteries are patent to the basilar. No basilar stenosis. Posterior circulation branch vessels are patent. There is atherosclerotic irregularity of the posterior cerebral arteries and superior cerebellar arteries.  Review of the MIP images confirms the above findings.  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 No other intracranial flow-limiting stenosis or embolic disease evident.  Areas of low density in the left MCA territory consistent with infarction. No hemorrhagic transformation. Question new low density in the pons, not definite.   Electronically Signed   By: Nelson Chimes M.D.   On: 05/30/2013 12:19   Dg Abd 1 View  05/30/2013   CLINICAL DATA:  NG tube placement  EXAM: ABDOMEN - 1 VIEW  COMPARISON:  None.  FINDINGS: The bowel gas pattern is normal. Residual contrast is identified in the renal calices. A nasogastric tube is identified coiled in the stomach with distal tip in the mid esophagus. Repositioning is recommended  IMPRESSION: A nasogastric tube is identified coiled in the stomach with distal tip in the mid esophagus. Repositioning is recommended   Electronically Signed   By: Abelardo Diesel M.D.   On: 05/30/2013 13:24       ASSESSMENT / PLAN: Acute CVA: multiple bilateral, most in  the R MCA but also R occipital and L thalamus infarcts w/ resultant expressive asphasia and right facial droop.    -  current thought is relative hypotension + severe stenosis of M1 of Left MCA as etiology for events (prior hypothesis of malignant hypertension or embolic phenomeonon thought less likely)  -  P:   Allow hypertension; no drugs. Call md when bp > 200 sbp. Looks like 170 is lowest patient can handle TEE to r/o embolic source when stable; neuro to decide if still needed Neuro recommends loop recorder at dc; TRH to contact cards about this hypercoag panel pending as above  Cont asa  Risk factor modification  PT consult, OT consult, Speech consult   Per neuro  PULMONARY A:at risk for intubation - resolved risk for the moment P:   Npo monitor  CARDIOVASCULAR A: concern for embolic stroke - less likely but not excluded P:  TEE when stable; neuro to decide if still needed - TRH to coordinate Loop recordeer at dc: TRH to coordinate  RENAL A:  Normal/Low Mag P:   Monitor Replete mag  GASTROINTESTINAL A:  AT risk for aspiration. Failed swallow 05/31/13 P:   Npo inclding meds. Start tube feeds  HEMATOLOGIC A:  Chronic mild polycythemia not relatd to smoking nos P:  monitor  INFECTIOUS A:  AT risk for aspiration Concern for endocarditis - low  - sepsis markers negative P:   Reduce fluid to kvo from 100cc/h Monitor off abx  ENDOCRINE A:  Hx of DM. HgbA1C 14.6 P:   ssi   TODAY'S SUMMARY:   -  move to neuro tele. TRH to pick up. PCCM will sign off. D/w Dr Wynelle Cleveland (family last updated 05/31/13 at bedside)     Dr. Brand Males, M.D., [facility].C.P Pulmonary and Critical Care Medicine Staff Physician [facility] Pager: [phone], If no answer or between  15:00h - 7:00h: call [phone]     06/01/2013 7:58 AM

## 2013-06-01 NOTE — Progress Notes (Signed)
Brief Nutrition Note/Follow-up  Consult received for enteral/tube feeding initiation and management.  Adult Enteral Nutrition Protocol initiated. Full assessment to follow.  PePUp Protocol initiated. Day 1 of TF protocol start Vital 1.2 at 25 mL/hr for the remainder of the day. Day 2 of TF protocol start new goal rate of 1320 mL (55 mL/hr). This will provide 1584 kcal, 99 g protein, 1071 ml free water.  Admitting Dx: Hypertension [401.9] Hyperglycemia [790.29] Hypertensive emergency [401.9] Acute CVA (cerebrovascular accident) [434.91]  Body mass index is 26.2 kg/(m^2). Pt meets criteria for overweight based on current BMI.  Labs:   Recent Labs Lab 05/29/13 2104 05/30/13 0243  05/30/13 1100  05/31/13 0150 05/31/13 0923 06/01/13 0250  NA 146 142  < > 139  < > 140 140 138  K  --  3.8  --  4.1  --   --   --  4.0  CL  --  101  --  100  --   --   --  102  CO2  --  23  --  19  --   --   --  18*  BUN  --  7  --  7  --   --   --  10  CREATININE  --  0.67  --  0.66  --   --   --  0.66  CALCIUM  --  8.9  --  9.1  --   --   --  8.8  MG  --  1.8  --   --   --   --   --  1.7  PHOS  --  3.5  --   --   --   --   --  4.2  GLUCOSE  --  213*  --  260*  --   --   --  157*  < > = values in this interval not displayed.  Terrace Arabia RD, LDN

## 2013-06-02 DIAGNOSIS — I6340 Cerebral infarction due to embolism of unspecified cerebral artery: Principal | ICD-10-CM

## 2013-06-02 DIAGNOSIS — D751 Secondary polycythemia: Secondary | ICD-10-CM

## 2013-06-02 DIAGNOSIS — D750 Familial erythrocytosis: Secondary | ICD-10-CM | POA: Diagnosis present

## 2013-06-02 DIAGNOSIS — R1310 Dysphagia, unspecified: Secondary | ICD-10-CM | POA: Diagnosis present

## 2013-06-02 LAB — GLUCOSE, CAPILLARY
GLUCOSE-CAPILLARY: 255 mg/dL — AB (ref 70–99)
GLUCOSE-CAPILLARY: 243 mg/dL — AB (ref 70–99)
Glucose-Capillary: 213 mg/dL — ABNORMAL HIGH (ref 70–99)
Glucose-Capillary: 227 mg/dL — ABNORMAL HIGH (ref 70–99)
Glucose-Capillary: 239 mg/dL — ABNORMAL HIGH (ref 70–99)
Glucose-Capillary: 255 mg/dL — ABNORMAL HIGH (ref 70–99)
Glucose-Capillary: 295 mg/dL — ABNORMAL HIGH (ref 70–99)

## 2013-06-02 LAB — CBC WITH DIFFERENTIAL/PLATELET
Basophils Absolute: 0 10*3/uL (ref 0.0–0.1)
Basophils Relative: 0 % (ref 0–1)
Eosinophils Absolute: 0.1 10*3/uL (ref 0.0–0.7)
Eosinophils Relative: 1 % (ref 0–5)
HEMATOCRIT: 41.3 % (ref 36.0–46.0)
Hemoglobin: 14.7 g/dL (ref 12.0–15.0)
LYMPHS ABS: 2.2 10*3/uL (ref 0.7–4.0)
LYMPHS PCT: 20 % (ref 12–46)
MCH: 30.9 pg (ref 26.0–34.0)
MCHC: 35.6 g/dL (ref 30.0–36.0)
MCV: 86.8 fL (ref 78.0–100.0)
MONO ABS: 1 10*3/uL (ref 0.1–1.0)
Monocytes Relative: 10 % (ref 3–12)
NEUTROS ABS: 7.3 10*3/uL (ref 1.7–7.7)
NEUTROS PCT: 69 % (ref 43–77)
Platelets: 258 10*3/uL (ref 150–400)
RBC: 4.76 MIL/uL (ref 3.87–5.11)
RDW: 12 % (ref 11.5–15.5)
WBC: 10.6 10*3/uL — ABNORMAL HIGH (ref 4.0–10.5)

## 2013-06-02 LAB — BASIC METABOLIC PANEL
BUN: 15 mg/dL (ref 6–23)
CALCIUM: 9.3 mg/dL (ref 8.4–10.5)
CHLORIDE: 100 meq/L (ref 96–112)
CO2: 24 meq/L (ref 19–32)
Creatinine, Ser: 0.69 mg/dL (ref 0.50–1.10)
GFR calc Af Amer: >90 mL/min (ref >90)
GFR calc non Af Amer: >90 mL/min (ref >90)
GLUCOSE: 274 mg/dL — AB (ref 70–99)
Potassium: 4 mEq/L (ref 3.7–5.3)
SODIUM: 138 meq/L (ref 137–147)

## 2013-06-02 LAB — PHOSPHORUS: PHOSPHORUS: 3.9 mg/dL (ref 2.3–4.6)

## 2013-06-02 LAB — MAGNESIUM: MAGNESIUM: 1.9 mg/dL (ref 1.5–2.5)

## 2013-06-02 MED ORDER — GLUCERNA 1.2 CAL PO LIQD
1000.0000 mL | ORAL | Status: DC
  Administered 2013-06-02 – 2013-06-04 (×3): 1000 mL
  Filled 2013-06-02 (×7): qty 1000

## 2013-06-02 MED ORDER — INSULIN GLARGINE 100 UNIT/ML ~~LOC~~ SOLN
10.0000 [IU] | Freq: Every day | SUBCUTANEOUS | Status: DC
  Administered 2013-06-02: 10 [IU] via SUBCUTANEOUS
  Filled 2013-06-02 (×2): qty 0.1

## 2013-06-02 NOTE — Progress Notes (Signed)
Stroke Team Progress Note  HISTORY [name] is a 53 y.o. female with a history of hypertension and diabetes who presented 05/27/2013 with difficulty speaking that started at some point on the day of admission. She did not notice it until after work, when she was speaking with her daughter. She did not speak to anyone at work that day.. The last time that she spoke to someone was when she was leaving for work around 8:30 AM. Patient was not administerd TPA secondary to delay in arrival. She was considered for enrollment in [facility].  She was admitted to the ICU for further evaluation and treatment.  SUBJECTIVE Patient's family at bedside; they are currently in the process of moving pt from ICU to 3W.  OBJECTIVE Most recent Vital Signs: Filed Vitals:   06/02/13 0800 06/02/13 0900 06/02/13 0913 06/02/13 1000  BP: 143/89 156/116  169/114  Pulse: 109 121  113  Temp:   99.1 F (37.3 C)   TempSrc:   Oral   Resp: 17 11  19   Height:      Weight:      SpO2: 92% 98%  97%   CBG (last 3)   Recent Labs  06/02/13 0026 06/02/13 0430 06/02/13 0858  GLUCAP 213* 255* 243*    IV Fluid Intake:   . sodium chloride 10 mL/hr at 06/02/13 0600  . feeding supplement (VITAL AF 1.2 CAL) 1,000 mL (06/02/13 0600)    MEDICATIONS  . aspirin  300 mg Rectal Daily  . heparin  5,000 Units Subcutaneous 3 times per day  . insulin aspart  0-15 Units Subcutaneous 6 times per day  . insulin glargine  10 Units Subcutaneous QHS  . nystatin  5 mL Oral QID  . simvastatin  20 mg Per Tube q1800   PRN:  alum & mag hydroxide-simeth, morphine injection  Diet:  NPO  Activity:  Bedrest  DVT Prophylaxis:  Heparin 5000 units sq tid   CLINICALLY SIGNIFICANT STUDIES Basic Metabolic Panel:   Recent Labs Lab 06/01/13 0250 06/02/13 0247  NA 138 138  K 4.0 4.0  CL 102 100  CO2 18* 24  GLUCOSE 157* 274*  BUN 10 15  CREATININE 0.66 0.69  CALCIUM 8.8 9.3  MG 1.7 1.9  PHOS 4.2 3.9   Liver Function Tests:    Recent Labs Lab 05/27/13 1724 05/30/13 1100  AST 16 25  ALT 14 11  ALKPHOS 120* 98  BILITOT 0.4 0.4  PROT 8.8* 7.3  ALBUMIN 4.5 3.3*   CBC:   Recent Labs Lab 06/01/13 0250 06/02/13 0247  WBC 9.8 10.6*  NEUTROABS 6.5 7.3  HGB 14.1 14.7  HCT 40.5 41.3  MCV 88.0 86.8  PLT 238 258   Coagulation:   Recent Labs Lab 05/27/13 1724  LABPROT 12.2  INR 0.92   Cardiac Enzymes:   Recent Labs Lab 05/30/13 1100 05/30/13 1640 05/30/13 2000 05/31/13 0150  CKTOTAL 87 84  --   --   CKMB 1.0 1.0  --   --   TROPONINI <0.30 <0.30 <0.30 <0.30   Urinalysis:   Recent Labs Lab 05/28/13 0842 05/30/13 1249  COLORURINE YELLOW YELLOW  LABSPEC 1.037* 1.039*  PHURINE 5.0 5.0  GLUCOSEU >1000* >1000*  HGBUR NEGATIVE NEGATIVE  BILIRUBINUR NEGATIVE SMALL*  KETONESUR 15* 40*  PROTEINUR NEGATIVE NEGATIVE  UROBILINOGEN 0.2 0.2  NITRITE NEGATIVE NEGATIVE  LEUKOCYTESUR NEGATIVE NEGATIVE   Lipid Panel    Component Value Date/Time   CHOL 201* 05/28/2013 0300   TRIG  145 05/28/2013 0300   HDL 42 05/28/2013 0300   CHOLHDL 4.8 05/28/2013 0300   VLDL 29 05/28/2013 0300   LDLCALC 130* 05/28/2013 0300   HgbA1C  Lab Results  Component Value Date   HGBA1C 14.1* 05/30/2013    Urine Drug Screen:     Component Value Date/Time   LABOPIA NONE DETECTED 10/04/2009 0314   COCAINSCRNUR NONE DETECTED 10/04/2009 0314   LABBENZ NONE DETECTED 10/04/2009 0314   AMPHETMU NONE DETECTED 10/04/2009 0314   THCU NONE DETECTED 10/04/2009 0314   LABBARB  Value: NONE DETECTED        DRUG SCREEN FOR MEDICAL PURPOSES ONLY.  IF CONFIRMATION IS NEEDED FOR ANY PURPOSE, NOTIFY LAB WITHIN 5 DAYS.        LOWEST DETECTABLE LIMITS FOR URINE DRUG SCREEN Drug Class       Cutoff (ng/mL) Amphetamine      1000 Barbiturate      200 Benzodiazepine   600 Tricyclics       459 Opiates          300 Cocaine          300 THC              50 10/04/2009 0314    Alcohol Level: No results found for this basename: ETH,  in the last 168  hours  Hypercoagulable Workup Normal - C3, C4, RPR,  HIV, homocysteine, ANA, Protein S activity, Protein S total, lupus anticoagulant  ESR - 25 (H) ; anti-thrombin 3 - 125 (H) ; protein C activity -  200 (H);  protein C total - 165 (H); cardiolipin antibodies - (L); Total Complement  > 60 (H)   CT of the brain   05/29/2013 Stable left-sided infattions are noted. No significant changes nted compared to prior exam. 05/29/2013  Involving multifocal left MCA territory nonhemorrhagic infarcts. The area of low-attenuation the left basal ganglia and left insula are larger compared with prior exams.  05/27/2013   No acute intracranial pathology.     MRI of the brain  05/27/2013     Multiple foci of acute ischemia within the left middle cerebral artery territory. In addition, however punctate focus of acute ischemia within the left occipital lobe and right thalamus (both of which would be posterior circulation). All areas of ischemia have similar acuity. No hemorrhagic conversion.  Symmetric abnormal bright signal within the lenticulostriate nuclei (basal ganglia) in addition to abnormal temporoccipital periventricular T2 hyperintense signal. These findings may reflect sequelae of acute severe hypertension (PRES) but, it can also be seen with osmotic demyelination, autoimmune disorders such as scleroderma (which can have associated vasculopathy), less likely infectious encephalitis.  Moderate to severe white matter changes, in a distribution which can be seen with demyelination, with superimposed possible chronic small vessel ischemic disease though, are nonspecific.    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  Mild luminal irregularity of the intracranial vessels which can be seen with intracranial atherosclerosis though, may also be seen with vasculopathy.    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 No other intracranial flow-limiting stenosis or embolic disease evident. Areas of low density in the left MCA territory consistent with infarction. No hemorrhagic transformation. Question new low density in the pons, not definite.  2D Echocardiogram  EF 55-60% with no source of embolus.   Carotid Doppler  No evidence of hemodynamically significant internal carotid artery stenosis. Vertebral artery flow is antegrade.   TEE  - needs to arrange.   EEG - This is an abnormal EEG secondary to slowing over the left hemisphere with frequent left hemispheric sharp acitivty as well.. This finding is suggestive of a focal disturbance that is etiologically nonspecific, but may include a mass lesion or post-ictal focal slowing among other possibilities.   CXR  05/27/2013    No radiographic evidence of an acute cardiopulmonary process.    EKG  sinus tachycardia. For complete results please see formal report.    Therapy Recommendations inpatient rehabilitation.   Physical Exam   Middle aged  African american lady not in distress.Awake alert. Afebrile. Head is nontraumatic. Neck is supple without bruit.. Cardiac exam no murmur or gallop. Lungs are clear to auscultation. Distal pulses are well felt. Neurological Exam : Awake alert moderate expressive aphasia with significant word hesitancy and nonfluent speech. Unable to speak words and short sentences. Able to comprehend one  -step commands only. Unable to name or repeat. Extraocular movements are full range without nystagmus. Fundi were not visualized. Vision acuity seems adequate. Decreased blink to threat on the right compared to the left. Moderate right lower facial weakness. Tongue midline. Dense right upper extremity plegia with 0/5 strength and 2/5 strength in the right lower extremity and able to move it against gravity. Normal strength in the left side. Plantars both downgoing. Gait was not  tested.  ASSESSMENT [name] is a 53 y.o. female presenting with difficulty speaking. 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  Infarcts felt to be embolic secondary to unknown source.   On no antithrombotics prior to admission. Now on aspirin 300 mg suppository for secondary stroke prevention. Patient with resultant expressive and receptive aphasia, right field cut and right hemiplegia, dysphagia (tube feedings).   Patient with progresive neuro worsening in hospital in the setting of relative hypotension for her blood pressure, becoming symptomatic around 135, now with R arm > leg hemiparesis; right arm plegic.   LP done on arrival unrevealing.  Malignant hypertension - neuro status declined with lower blood pressures. Keep systolic blood pressure in the 180-200 range has the patient seems to be dependent on this pressure and has neurological worsening when blood pressure is close to the normal range Diabetes, HgbA1c 14.6, goal < 7.0  Hyperlipidemia, LDL 130, on no statin PTA, now on zocor 20 mg daily, goal LDL < 70 for diabetics EEG - slowing over the left hemisphere with frequent left hemispheric sharp acitivty  Chronic mild polycythemia, benign  Hospital day # 6  TREATMENT/PLAN  Continue aspirin for secondary stroke prevention.  Agree with plans to transfer to the floor  Maintain elevated BP to prevent neuro worsening Will consider TEE in the future, likely after discharge/rehab, may consider loop; for now, continue to stabilize Disposition: Inpatient rehabilitation, they are following  Burnetta Sabin, MSN, RN, ANVP-BC, ANP-BC, GNP-BC [name] Pager: [phone] 06/02/2013 10:35 AM  I have personally obtained a history, examined the patient, evaluated imaging results, and formulated the assessment and plan of care. I agree with the  above. Antony Contras, MD

## 2013-06-02 NOTE — Evaluation (Signed)
Occupational Therapy Evaluation Patient Details Name: [name] MRN: [id number] DOB: November 01, 1960 Today's Date: 06/02/2013 Time: 1131-1200 OT Time Calculation (min): 29 min  OT Assessment / Plan / Recommendation History of present illness 53 y.o. female presenting with difficulty speaking. Per MRI: Multiple foci of acute ischemia within the left middle cerebral artery territory. In addition, however punctate focus of acute ischemia within the left occipital lobe and right thalamus (both of which would be posterior circulation); also PRES    Clinical Impression   This 53 yo female admitted with above presents to acute OT with absent movement RUE, decreased AROM RLE, decreased trunk control, inattention to right side, expressive and receptive difficulties all affecting pt's PLOF of Independent with BADLS/IADLs/and working. Will benefit from acute OT with follow up OT on CIR.    OT Assessment  Patient needs continued OT Services    Follow Up Recommendations  CIR    Barriers to Discharge Decreased caregiver support    Equipment Recommendations   (TBD at next venue)       Frequency  Min 3X/week    Precautions / Restrictions Precautions Precautions: Fall Restrictions Weight Bearing Restrictions: No   Pertinent Vitals/Pain BP elevated (see doc flow sheet/PT note)    ADL  Eating/Feeding: NPO Grooming: Minimal assistance Where Assessed - Grooming: Unsupported sitting Upper Body Bathing: Moderate assistance Where Assessed - Upper Body Bathing: Unsupported sitting Lower Body Bathing: Maximal assistance Where Assessed - Lower Body Bathing: Supported sit to stand Upper Body Dressing: +1 Total assistance Where Assessed - Upper Body Dressing: Unsupported sitting Lower Body Dressing: +1 Total assistance Where Assessed - Lower Body Dressing: Supported sit to stand Toilet Transfer: +2 Total assistance;Moderate assistance Toilet Transfer Method: Sit to stand;Stand pivot (going to  pt's left) Toilet Transfer Equipment:  (bed>recliner) Toileting - Clothing Manipulation and Hygiene: +1 Total assistance Where Assessed - Toileting Clothing Manipulation and Hygiene: Sit to stand from 3-in-1 or toilet Equipment Used: Gait belt Transfers/Ambulation Related to ADLs: see mobility section ADL Comments: Pt needs multi-modal cues (verbal, gestural, tactile)    OT Diagnosis: Generalized weakness;Cognitive deficits;Disturbance of vision;Hemiplegia non-dominant side  OT Problem List: Decreased strength;Decreased range of motion;Impaired balance (sitting and/or standing);Decreased cognition;Decreased safety awareness;Impaired vision/perception;Impaired tone;Decreased knowledge of use of DME or AE;Impaired UE functional use OT Treatment Interventions: Self-care/ADL training;Visual/perceptual remediation/compensation;Patient/family education;Balance training;Neuromuscular education;Therapeutic activities;DME and/or AE instruction;Cognitive remediation/compensation   OT Goals(Current goals can be found in the care plan section) Acute Rehab OT Goals Patient Stated Goal: pt unable to state (aphasia) OT Goal Formulation: With patient Time For Goal Achievement: 06/16/13 Potential to Achieve Goals: Good  Visit Information  Last OT Received On: 06/02/13 Assistance Needed: +2 PT/OT/SLP Co-Evaluation/Treatment: Yes Reason for Co-Treatment: Complexity of the patient's impairments (multi-system involvement) PT goals addressed during session: Mobility/safety with mobility;Balance;Strengthening/ROM OT goals addressed during session: Other (comment) (balance and mobility as precursor to BADLs) History of Present Illness: 53 y.o. female presenting with difficulty speaking. Per MRI: Multiple foci of acute ischemia within the left middle cerebral artery territory. In addition, however punctate focus of acute ischemia within the left occipital lobe and right thalamus (both of which would be posterior  circulation); also PRES        Prior Functioning     Home Living Family/patient expects to be discharged to:: Private residence Living Arrangements: Alone;Children Available Help at Discharge: Family;Available PRN/intermittently Type of Home: House Home Access: Stairs to enter [employer] of Steps: several Entrance Stairs-Rails: Can reach both Home Layout: One level Home  Equipment: None Prior Function Level of Independence: Independent Comments: Pt working PTA and driving. Communication Communication: Receptive difficulties;Expressive difficulties Dominant Hand: Left         Vision/Perception Vision - History Patient Visual Report: Overshooting Vision - Assessment Vision Assessment: Vision tested Ocular Range of Motion: Within Functional Limits Tracking/Visual Pursuits: Able to track stimulus in all quads without difficulty Additional Comments: Pt's tendency is to keep head and eyes right; however can turn head and eyes and maintain with cuing   Cognition  Cognition Arousal/Alertness: Awake/alert Behavior During Therapy: WFL for tasks assessed/performed Overall Cognitive Status: Difficult to assess Area of Impairment: Following commands Following Commands: Follows one step commands inconsistently (multimodal cues follows ~50%) Difficult to assess due to: Impaired communication    Extremity/Trunk Assessment Upper Extremity Assessment Upper Extremity Assessment: RUE deficits/detail RUE Deficits / Details: Brunstrum 1 (hand and arm) RUE Coordination: decreased fine motor;decreased gross motor     Mobility Bed Mobility Overal bed mobility: Needs Assistance Bed Mobility: Rolling;Sidelying to Sit Rolling: Min assist (with rail to her Rt) Sidelying to sit: Mod assist (with rail) General bed mobility comments: multimodal cues to maximize pt's ability to move herself; required assist to initiate rolling, assist to move RLE off bed and raise  torso Transfers Overall transfer level: Needs assistance Equipment used: None Transfers: Sit to/from [facility] Sit to Stand: Mod assist;+2 safety/equipment Stand pivot transfers: Mod assist;+2 physical assistance General transfer comment: stood x 2 from bed requiring block to Rt knee to prevent bucking; with step-pivot to her Lt, required assist to weight shift to her Right, to extend Rt hip and knee to allow weight bearing and then she advanced her own LLE; multiple small steps with LLE to pivot        Balance Balance Overall balance assessment: Needs assistance Sitting-balance support: No upper extremity supported;Feet supported Sitting balance-Leahy Scale: Poor Sitting balance - Comments: minguard assist while reaching within her base of support (with LUE) Postural control: Right lateral lean Standing balance support: Bilateral upper extremity supported Standing balance-Leahy Scale: Poor General Comments General comments (skin integrity, edema, etc.): Tries very hard to speak when asked questions, only once did she make a garbled sound. With multi-modal cues did well following instructions with excellent effort   End of Session OT - End of Session Equipment Utilized During Treatment: Gait belt Activity Tolerance: Patient tolerated treatment well Patient left: in chair;with call bell/phone within reach (could not find red button to push--vision v. receptive difficulites)       Almon Register [phone] 06/02/2013, 1:02 PM

## 2013-06-02 NOTE — Progress Notes (Signed)
[facility]  [name]:[id number] DOB: 1960-12-20 DOA: 05/27/2013 PCP: No PCP Per Patient  Brief narrative: 53 year old female patient with known history of hypertension. Initially presented to the emergency department on 05/27/2013 with right-sided facial droop and aphasia. Time of onset unknown when she presented. Apparently last seen normal around 8:30 AM that morning. Initial CT was negative and her presenting blood pressure was markedly elevated at 205/121. She was evaluated by neurology who asked the critical care team to admit to the neuro ICU for stroke evaluation.  After admission subsequent MRI MRA of the brain revealed multiple foci of acute ischemia within the left middle cerebral artery territory as well as left occipital lobe and right thalamic regions. She was also found to have a left M1 occlusion without collateralization. Echocardiogram was unremarkable for possible embolic source but given presentation TEE was recommended by neurology.  She was transitioned out with stepdown orders and team 1 was to assume care of the patient on 05/30/2013 but on that same morning she became unresponsive while sitting upright in the chair with her blood pressure decreasing to 135 after a dose of labetalol. At that point her right side became flaccid but the symptoms improved with subsequent increase in blood pressure to a hypertensive range. CT demonstrated a stenotic M1 lesion in the left MCA but no new findings. EEG showed left focal abnormality. The patient was kept in the ICU.  Initially patient's recurrent episodes of lethargy and neuro changes were presumed related to PRES but now are attributed to relative hypotension in the setting of a patient with chronic severe malignant hypertension and it was felt allowing significantly high permissive hypertension would be appropriate for this patient. She was eventually appropriate to transfer to the  neuro telemetry floor.  Assessment/Plan:   Acute bilateral embolic stroke -Has persistent right-sided deficits -PT/OT evaluation -Hopefully to CIR when more medically stable and after workup completed -consult cardiology for TEE -Hypercoagulable labs unrevealing    Aphasia complicating stroke -Continue speech therapy    Hypertensive emergency/Malignant hypertension -Recommendation is to keep systolic blood pressure between 180 and 200 noting patient seems to be dependent on this pressure has neurological worsening when blood pressure is close to normal range -Currently not on any antihypertensive medications    DM (diabetes mellitus) type II controlled, neurological manifestation -Uncontrolled with hemoglobin A1c 14.6 -CBGs have increased with the addition of tube feeding so we'll adjust Lantus and sliding scale accordingly -Tube feedings changed to Glucerna from Vital AF    Dysphagia -Continue tube feedings through temporary tube -Hopefully over time swallowing will improve to allow removal of feeding tube    Benign polycythemia   DVT prophylaxis: Subcutaneous heparin Code Status: Full Family Communication: Son at bedside Disposition Plan/Expected LOS: Transfer to neuro telemetry floor- hopefully will go to CIR   Consultants: PCCM Neurology Cardiology  Procedures: Transthoracic Echocardiography Left ventricle: The cavity size was normal. Systolic function was normal. The estimated ejection fraction was in the range of 55% to 60%. Wall motion was normal; there were no regional wall motion abnormalities. Due to tachycardia, there was fusion of early and atrial contributions to ventricular filling.  Carotid Duplex - The vertebral arteries appear patent with antegrade flow. - Findings consistent with 1-39 percent stenosis involving the right internal carotid artery and the left internal carotid artery.  Antibiotics: None  HPI/Subjective: Patient alert and nods  appropriately to questions asked. No apparent complaints.  Objective:  Blood pressure 167/108, pulse 112, temperature 97.9 F (36.6 C), temperature source Oral, resp. rate 20, height 5\' 6"  (1.676 m), weight 159 lb 6.3 oz (72.3 kg), SpO2 97.00%.  Intake/Output Summary (Last 24 hours) at 06/02/13 1407 Last data filed at 06/02/13 1100  Gross per 24 hour  Intake    885 ml  Output   1015 ml  Net   -130 ml     Exam: General: No acute respiratory distress Lungs: Clear to auscultation bilaterally without wheezes or crackles, RA Cardiovascular: Regular rate and rhythm without murmur gallop or rub normal S1 and S2, no peripheral edema or JVD Abdomen: Nontender, nondistended, soft, bowel sounds positive, no rebound, no ascites, no appreciable mass Musculoskeletal: No significant cyanosis, clubbing of bilateral lower extremities Neurological: Alert and oriented to at least name but seems appropriate otherwise when questions asked-unable to obtain accurate assessment since patient with severe expressive aphasia, unable to move right arm and has marked decrease motor ability and right leg. Scheduled Meds:  Scheduled Meds: . aspirin  300 mg Rectal Daily  . heparin  5,000 Units Subcutaneous 3 times per day  . insulin aspart  0-15 Units Subcutaneous 6 times per day  . insulin glargine  10 Units Subcutaneous QHS  . nystatin  5 mL Oral QID  . simvastatin  20 mg Per Tube q1800   Continuous Infusions: . sodium chloride 10 mL/hr at 06/02/13 0600  . feeding supplement (GLUCERNA 1.2 CAL) 1,000 mL (06/02/13 1232)    Data Reviewed: Basic Metabolic Panel:  Recent Labs Lab 05/27/13 1724 05/29/13 2104 05/30/13 0243  05/30/13 1100  05/30/13 2000 05/31/13 0150 05/31/13 0923 06/01/13 0250 06/02/13 0247  NA 135* 146 142  < > 139  < > 139 140 140 138 138  K 3.7  --  3.8  --  4.1  --   --   --   --  4.0 4.0  CL 94*  --  101  --  100  --   --   --   --  102 100  CO2 24  --  23  --  19  --   --   --    --  18* 24  GLUCOSE 353*  --  213*  --  260*  --   --   --   --  157* 274*  BUN 10  --  7  --  7  --   --   --   --  10 15  CREATININE 0.73  --  0.67  --  0.66  --   --   --   --  0.66 0.69  CALCIUM 9.7  --  8.9  --  9.1  --   --   --   --  8.8 9.3  MG  --   --  1.8  --   --   --   --   --   --  1.7 1.9  PHOS  --   --  3.5  --   --   --   --   --   --  4.2 3.9  < > = values in this interval not displayed. Liver Function Tests:  Recent Labs Lab 05/27/13 1724 05/30/13 1100  AST 16 25  ALT 14 11  ALKPHOS 120* 98  BILITOT 0.4 0.4  PROT 8.8* 7.3  ALBUMIN 4.5 3.3*   No results found for this basename: LIPASE, AMYLASE,  in the last 168 hours No results  found for this basename: AMMONIA,  in the last 168 hours CBC:  Recent Labs Lab 05/27/13 1724 05/27/13 2332 05/30/13 0243 05/30/13 1100 06/01/13 0250 06/02/13 0247  WBC 8.3 8.3 9.3 9.8 9.8 10.6*  NEUTROABS 4.9  --   --   --  6.5 7.3  HGB 16.1* 15.1* 15.1* 15.5* 14.1 14.7  HCT 44.5 42.5 42.9 44.0 40.5 41.3  MCV 84.9 85.0 86.5 86.8 88.0 86.8  PLT 259 267 261 250 238 258   Cardiac Enzymes:  Recent Labs Lab 05/30/13 1100 05/30/13 1640 05/30/13 2000 05/31/13 0150  CKTOTAL 87 84  --   --   CKMB 1.0 1.0  --   --   TROPONINI <0.30 <0.30 <0.30 <0.30   BNP (last 3 results) No results found for this basename: PROBNP,  in the last 8760 hours CBG:  Recent Labs Lab 06/01/13 1948 06/02/13 0026 06/02/13 0430 06/02/13 0858 06/02/13 1200  GLUCAP 200* 213* 255* 243* 227*    Recent Results (from the past 240 hour(s))  MRSA PCR SCREENING     Status: None   Collection Time    05/27/13 10:20 PM      Result Value Ref Range Status   MRSA by PCR NEGATIVE  NEGATIVE Final   Comment:            The GeneXpert MRSA Assay (FDA     approved for NASAL specimens     only), is one component of a     comprehensive MRSA colonization     surveillance program. It is not     intended to diagnose MRSA     infection nor to guide or      monitor treatment for     MRSA infections.  CSF CULTURE     Status: None   Collection Time    05/27/13 11:41 PM      Result Value Ref Range Status   Specimen Description CSF   Final   Special Requests Normal   Final   Gram Stain     Final   Value: CYTOSPIN SLIDE NO WBC SEEN     NO ORGANISMS SEEN     Performed at [facility]   Culture     Final   Value: NO GROWTH 3 DAYS     Performed at [facility]   Report Status 05/31/2013 FINAL   Final  CULTURE, BLOOD (ROUTINE X 2)     Status: None   Collection Time    05/30/13 11:00 AM      Result Value Ref Range Status   Specimen Description BLOOD RIGHT ARM   Final   Special Requests BOTTLES DRAWN AEROBIC ONLY 4CC   Final   Culture  Setup Time     Final   Value: 05/30/2013 14:31     Performed at [facility]   Culture     Final   Value:        BLOOD CULTURE RECEIVED NO GROWTH TO DATE CULTURE WILL BE HELD FOR 5 DAYS BEFORE ISSUING A FINAL NEGATIVE REPORT     Performed at [facility]   Report Status PENDING   Incomplete  CULTURE, BLOOD (ROUTINE X 2)     Status: None   Collection Time    05/30/13 11:10 AM      Result Value Ref Range Status   Specimen Description BLOOD RIGHT HAND   Final   Special Requests BOTTLES DRAWN AEROBIC ONLY 5CC   Final   Culture  Setup Time     Final   Value: 05/30/2013 14:31     Performed at [facility]   Culture     Final   Value:        BLOOD CULTURE RECEIVED NO GROWTH TO DATE CULTURE WILL BE HELD FOR 5 DAYS BEFORE ISSUING A FINAL NEGATIVE REPORT     Performed at [facility]   Report Status PENDING   Incomplete  URINE CULTURE     Status: None   Collection Time    05/30/13 12:49 PM      Result Value Ref Range Status   Specimen Description URINE, CATHETERIZED   Final   Special Requests NONE   Final   Culture  Setup Time     Final   Value: 05/30/2013 16:39     Performed at [facility]     Final   Value: NO GROWTH     Performed at  [facility]   Culture     Final   Value: NO GROWTH     Performed at [facility]   Report Status 05/31/2013 FINAL   Final     Studies:  Recent x-ray studies have been reviewed in detail by the Attending Physician  Time spent :     Erin Hearing, [place] Office  [phone] Pager [phone]  **If unable to reach the above provider after paging please contact the [facility] @ [phone]  On-Call/Text Page:      [email or web address]      password [email or web address]  If 7PM-7AM, please contact night-coverage [email or web address] Password [email or web address] 06/02/2013, 2:07 PM   LOS: 6 days   I have examined the patient, reviewed the chart and modified the above note which I agree with.   Benjamin Casanas,MD Pager # [phone] [email or web address] 06/02/2013, 3:29 PM

## 2013-06-02 NOTE — Progress Notes (Signed)
Speech Language Pathology Treatment: Dysphagia;Cognitive-Linquistic  Patient Details Name: [name] MRN: [id number] DOB: [birth year] Today's Date: 06/02/2013 Time: 7829-5621 SLP Time Calculation (min): 49 min  Assessment / Plan / Recommendation Clinical Impression  Patient continues to exhibit severe receptive and expressive aphasia with oral/verbal and limb apraxia.  Pt. May have more receptive ability than she is able to demonstrate, as she was noted to smile and laugh with pure verbal joking, even though she could not answer yes/no questions with head nods or follow 1 step commands.  Pt. Was unable to sing or count to 5 in unison with SLP.  Occasional phonation was heard when laughing.  Attempted po trials with ice chip and applesauce following thorough oral care.  Pt. Was holding saliva in mouth initially, and was unable to swallow on command.  SLP suctioned and cleaned mouth.  When given ice chip, pt. Held it orally and it spilled from mouth.  With applesauce the pt. Initiated a swallow by had an immediate explosive cough.  SLP suctioned the remainder from pt's mouth.     HPI HPI: 53 y.o. female presenting with difficulty speaking. Per MRI: Multiple foci of acute ischemia within the left middle cerebral artery territory. In addition, however punctate focus of acute ischemia within the left occipital lobe and right thalamus (both of which would be posterior circulation). Currently on NG tube for feeding.   Pertinent Vitals Low grade temps noted.  LS diminished; CXR: NAD  SLP Plan  Continue with current plan of care    Recommendations Diet recommendations: NPO Medication Administration: Via alternative means              Oral Care Recommendations: Oral care Q4 per protocol;Oral care prior to ice chips Follow up Recommendations: Inpatient Rehab Plan: Continue with current plan of care    GO     [name] T 06/02/2013, 4:24 PM

## 2013-06-02 NOTE — Progress Notes (Signed)
NUTRITION FOLLOW UP  Intervention:    Change TF via NGT to Glucerna 1.2 at 60 ml/h to provide 1728 kcals, 87 gm protein, 1159 ml free water daily.  Nutrition Dx:   Inadequate oral intake related to inability to eat as evidenced by NPO status. Ongoing.  Goal:   Intake to meet >90% of estimated nutrition needs.  Monitor:   TF tolerance and adequacy, labs, weight trend, swallowing function, and ability to advance PO diet.  Assessment:   53 year old female with a history of hypertension and diabetes, admitted with difficulty speaking. Found to have CVA. Patient with worsening mental status, weakness, and aphasia.   S/P bedside swallow evaluation on 3/14. Patient with overt s/s of aspiration. Recommended NPO status; SLP to follow for PO trials. Received MD Consult for TF initiation and management. NGT is in place. Patient is receiving Vital AF 1.2 at 55 ml/h providing 1584 kcals, 99 gm protein, 1071 ml free water daily. CBG's elevated, will change TF to Glucerna 1.2.   Height: Ht Readings from Last 1 Encounters:  05/27/13 5\' 6"  (1.676 m)    Weight Status:   Wt Readings from Last 1 Encounters:  06/02/13 159 lb 6.3 oz (72.3 kg)  05/29/13  161 lb 6 oz (73.2 kg)   Body mass index is 25.74 kg/(m^2).   Re-estimated needs:  Kcal: 1600-1800 Protein: 80-95 gm Fluid: 1.6-1.8 l  Skin: no wounds  Diet Order: NPO   Intake/Output Summary (Last 24 hours) at 06/02/13 1208 Last data filed at 06/02/13 1100  Gross per 24 hour  Intake    955 ml  Output   1115 ml  Net   -160 ml    Last BM: None documented since admission   Labs:   Recent Labs Lab 05/30/13 0243  05/30/13 1100  05/31/13 0923 06/01/13 0250 06/02/13 0247  NA 142  < > 139  < > 140 138 138  K 3.8  --  4.1  --   --  4.0 4.0  CL 101  --  100  --   --  102 100  CO2 23  --  19  --   --  18* 24  BUN 7  --  7  --   --  10 15  CREATININE 0.67  --  0.66  --   --  0.66 0.69  CALCIUM 8.9  --  9.1  --   --  8.8 9.3  MG  1.8  --   --   --   --  1.7 1.9  PHOS 3.5  --   --   --   --  4.2 3.9  GLUCOSE 213*  --  260*  --   --  157* 274*  < > = values in this interval not displayed.  CBG (last 3)   Recent Labs  06/02/13 0430 06/02/13 0858 06/02/13 1200  GLUCAP 255* 243* 227*    Scheduled Meds: . aspirin  300 mg Rectal Daily  . heparin  5,000 Units Subcutaneous 3 times per day  . insulin aspart  0-15 Units Subcutaneous 6 times per day  . insulin glargine  10 Units Subcutaneous QHS  . nystatin  5 mL Oral QID  . simvastatin  20 mg Per Tube q1800    Continuous Infusions: . sodium chloride 10 mL/hr at 06/02/13 0600  . feeding supplement (VITAL AF 1.2 CAL) 1,000 mL (06/02/13 0600)    Molli Barrows, RD, LDN, [place] Pager [phone] After Hours Pager [phone]

## 2013-06-02 NOTE — Progress Notes (Signed)
Physical Therapy Treatment Patient Details Name: [name] MRN: [id number] DOB: 09-20-1960 Today's Date: 06/02/2013    History of Present Illness 53 y.o. female presenting with difficulty speaking. Per MRI: Multiple foci of acute ischemia within the left middle cerebral artery territory. In addition, however punctate focus of acute ischemia within the left occipital lobe and right thalamus (both of which would be posterior circulation); also PRES     PT Comments    Pt very alert and responds well to multi-modal cues. Very motivated. Somewhat limited by pt's continued elevated BP (diastolic remains elevated). Associated reaction noted with RUE with yawning. Trace activity noted in RLE with AAROM and in standing. Excellent rehab candidate as BP stabilizes.   Follow Up Recommendations  CIR;Supervision/Assistance - 24 hour     Equipment Recommendations   (TBD)    Recommendations for Other Services      Precautions / Restrictions Precautions Precautions: Fall    Mobility  Bed Mobility Overal bed mobility: Needs Assistance Bed Mobility: Rolling;Sidelying to Sit Rolling: Min assist (with rail to her Rt) Sidelying to sit: Mod assist (with rail)       General bed mobility comments: multimodal cues to maximize pt's ability to move herself; required assist to initiate rolling, assist to move RLE off bed and raise torso  Transfers Overall transfer level: Needs assistance Equipment used: None Transfers: Sit to/from [employer] Transfers Sit to Stand: Mod assist;+2 safety/equipment Stand pivot transfers: Mod assist;+2 physical assistance       General transfer comment: stood x 2 from bed requiring block to Rt knee to prevent bucking; with step-pivot to her Lt, required assist to weight shift to her Right, to extend Rt hip and knee to allow weight bearing and then she advanced her own LLE; multiple small steps with LLE to pivot  Ambulation/Gait                  Stairs            Wheelchair Mobility    Modified Rankin (Stroke Patients Only) Modified Rankin (Stroke Patients Only) Pre-Morbid Rankin Score: No symptoms Modified Rankin: Severe disability     Balance Overall balance assessment: Needs assistance Sitting-balance support: No upper extremity supported;Feet supported Sitting balance-Leahy Scale: Poor Sitting balance - Comments: minguard assist while reaching within her base of support (with LUE) Postural control: Right lateral lean Standing balance support: Bilateral upper extremity supported Standing balance-Leahy Scale: Poor                      Cognition Arousal/Alertness: Awake/alert Behavior During Therapy: WFL for tasks assessed/performed Overall Cognitive Status: Difficult to assess Area of Impairment: Following commands       Following Commands: Follows one step commands inconsistently (multimodal cues follows ~50%)            Exercises General Exercises - Upper Extremity Shoulder Flexion: PROM;Right;5 reps;Supine Shoulder ABduction: PROM;Right;5 reps;Supine Elbow Flexion: PROM;Right;5 reps;Supine Elbow Extension: PROM;Right;5 reps;Supine Wrist Flexion: PROM;Right;5 reps;Supine Wrist Extension: PROM;Right;5 reps;Supine General Exercises - Lower Extremity Ankle Circles/Pumps: PROM;Right;5 reps;Supine Heel Slides: AAROM;Right;5 reps;Supine (no flexion noted; pt provided min hip/knee extension) Hip ABduction/ADduction: AAROM;Right;5 reps;Supine (hooklying; min hip adduction noted)  General Comments General comments (skin integrity, edema, etc.): Tries very hard to speak when asked questions, only once did she make a garbled sound. With multi-modal cues did well following instructions with excellent effort      Pertinent Vitals/Pain BP supine 189/136  Sit        183/119 After transfer  199/126 RN aware    Home Living                      Prior Function            PT  Goals (current goals can now be found in the care plan section) Acute Rehab PT Goals Patient Stated Goal: pt unable to state (aphasia) Progress towards PT goals: Progressing toward goals    Frequency  Min 4X/week    PT Plan Current plan remains appropriate    End of Session Equipment Utilized During Treatment: Gait belt Activity Tolerance: Patient tolerated treatment well Patient left: in chair;with call bell/phone within reach     Time: 1108-1202 PT Time Calculation (min): 54 min  Charges:  $Therapeutic Activity: 8-22 mins $Neuromuscular Re-education: 23-37 mins                    G Codes:      Daylan Juhnke 06/28/2013, 12:22 PM Pager [phone]

## 2013-06-02 NOTE — Progress Notes (Signed)
Rehab admissions - I met with pt who was greatly limited by aphasia. Pt nodded in agreement that I could contact her dtr for baseline details. I also left informational brochures and explained the purpose of acute inpatient rehab.  Then, I spoke with pt's daughter [name] by phone who gave baseline details (pt was previously independent prior to CVA and was working part time in medical records). Pt stated that she and her brother could provide 24 hr assistance. Pt's dtr is interested in pursuing inpatient rehab for her mother.  Received update on mobility from [place], [place] and pt is an appropriate candidate. Noted pt's continued medical issues with significant hypertension and made note of acceptable BP parameters with systolic BP ok between 931-121 mmHG per internal medicine.  Will follow pt status, noting that further medical workup has been ordered including TEE.  Will keep pt/family and medical team aware of any updates and will consider pt for a candidate for inpatient rehab dependent on her medical stability and bed availability.  Please call me with any questions. Thanks.  Nanetta Batty, PT Rehabilitation Admissions Coordinator [phone]

## 2013-06-03 ENCOUNTER — Encounter (HOSPITAL_COMMUNITY)
Admission: EM | Disposition: A | Discharge status: Home / Self Care | Payer: Self-pay | Source: Home / Self Care | Attending: Internal Medicine

## 2013-06-03 ENCOUNTER — Inpatient Hospital Stay (HOSPITAL_COMMUNITY): Payer: Medicaid Other

## 2013-06-03 LAB — CBC WITH DIFFERENTIAL/PLATELET
BASOS ABS: 0 10*3/uL (ref 0.0–0.1)
BASOS PCT: 0 % (ref 0–1)
EOS PCT: 2 % (ref 0–5)
Eosinophils Absolute: 0.2 10*3/uL (ref 0.0–0.7)
HEMATOCRIT: 41.1 % (ref 36.0–46.0)
Hemoglobin: 14.5 g/dL (ref 12.0–15.0)
Lymphocytes Relative: 25 % (ref 12–46)
Lymphs Abs: 2.3 10*3/uL (ref 0.7–4.0)
MCH: 30.8 pg (ref 26.0–34.0)
MCHC: 35.3 g/dL (ref 30.0–36.0)
MCV: 87.3 fL (ref 78.0–100.0)
MONO ABS: 0.9 10*3/uL (ref 0.1–1.0)
MONOS PCT: 9 % (ref 3–12)
Neutro Abs: 6 10*3/uL (ref 1.7–7.7)
Neutrophils Relative %: 64 % (ref 43–77)
Platelets: 247 10*3/uL (ref 150–400)
RBC: 4.71 MIL/uL (ref 3.87–5.11)
RDW: 11.9 % (ref 11.5–15.5)
WBC: 9.3 10*3/uL (ref 4.0–10.5)

## 2013-06-03 LAB — LEGIONELLA ANTIGEN, URINE: Legionella Antigen, Urine: NEGATIVE

## 2013-06-03 LAB — BASIC METABOLIC PANEL
BUN: 16 mg/dL (ref 6–23)
CALCIUM: 9.7 mg/dL (ref 8.4–10.5)
CO2: 26 mEq/L (ref 19–32)
CREATININE: 0.68 mg/dL (ref 0.50–1.10)
Chloride: 99 mEq/L (ref 96–112)
GFR calc Af Amer: >90 mL/min (ref >90)
GFR calc non Af Amer: >90 mL/min (ref >90)
GLUCOSE: 282 mg/dL — AB (ref 70–99)
Potassium: 4 mEq/L (ref 3.7–5.3)
Sodium: 141 mEq/L (ref 137–147)

## 2013-06-03 LAB — GLUCOSE, CAPILLARY
GLUCOSE-CAPILLARY: 276 mg/dL — AB (ref 70–99)
Glucose-Capillary: 234 mg/dL — ABNORMAL HIGH (ref 70–99)
Glucose-Capillary: 245 mg/dL — ABNORMAL HIGH (ref 70–99)
Glucose-Capillary: 226 mg/dL — ABNORMAL HIGH (ref 70–99)
Glucose-Capillary: 280 mg/dL — ABNORMAL HIGH (ref 70–99)

## 2013-06-03 LAB — PHOSPHORUS: Phosphorus: 4.4 mg/dL (ref 2.3–4.6)

## 2013-06-03 LAB — MAGNESIUM: Magnesium: 2 mg/dL (ref 1.5–2.5)

## 2013-06-03 SURGERY — ECHOCARDIOGRAM, TRANSESOPHAGEAL
Anesthesia: Moderate Sedation

## 2013-06-03 MED ORDER — INSULIN GLARGINE 100 UNIT/ML ~~LOC~~ SOLN
13.0000 [IU] | Freq: Every day | SUBCUTANEOUS | Status: DC
  Administered 2013-06-03: 13 [IU] via SUBCUTANEOUS
  Filled 2013-06-03 (×2): qty 0.13

## 2013-06-03 NOTE — Progress Notes (Addendum)
Stroke Team Progress Note  HISTORY [name] is a 53 y.o. female with a history of hypertension and diabetes who presented 05/27/2013 with difficulty speaking that started at some point on the day of admission. She did not notice it until after work, when she was speaking with her daughter. She did not speak to anyone at work that day.. The last time that she spoke to someone was when she was leaving for work around 8:30 AM. Patient was not administerd TPA secondary to delay in arrival. She was considered for enrollment in [facility].  She was admitted to the ICU for further evaluation and treatment.  SUBJECTIVE Her son is at the bedside.  OBJECTIVE Most recent Vital Signs: Filed Vitals:   06/03/13 0025 06/03/13 0400 06/03/13 0415 06/03/13 0835  BP: 178/101 173/113  188/111  Pulse:  121  122  Temp:  98.9 F (37.2 C)  98.9 F (37.2 C)  TempSrc:  Oral  Oral  Resp:  18  18  Height:      Weight:   72.53 kg (159 lb 14.4 oz)   SpO2:  98%  96%   CBG (last 3)   Recent Labs  06/02/13 2348 06/03/13 0340 06/03/13 0839  GLUCAP 255* 234* 276*    IV Fluid Intake:   . sodium chloride 10 mL/hr at 06/02/13 0600  . feeding supplement (GLUCERNA 1.2 CAL) 1,000 mL (06/03/13 0950)    MEDICATIONS  . aspirin  300 mg Rectal Daily  . heparin  5,000 Units Subcutaneous 3 times per day  . insulin aspart  0-15 Units Subcutaneous 6 times per day  . insulin glargine  10 Units Subcutaneous QHS  . nystatin  5 mL Oral QID  . simvastatin  20 mg Per Tube q1800   PRN:  alum & mag hydroxide-simeth, morphine injection  Diet:  NPO  Activity:  Up in chair DVT Prophylaxis:  Heparin 5000 units sq tid   CLINICALLY SIGNIFICANT STUDIES Basic Metabolic Panel:   Recent Labs Lab 06/02/13 0247 06/03/13 0510  NA 138 141  K 4.0 4.0  CL 100 99  CO2 24 26  GLUCOSE 274* 282*  BUN 15 16  CREATININE 0.69 0.68  CALCIUM 9.3 9.7  MG 1.9 2.0  PHOS 3.9 4.4   Liver Function Tests:   Recent Labs Lab  05/27/13 1724 05/30/13 1100  AST 16 25  ALT 14 11  ALKPHOS 120* 98  BILITOT 0.4 0.4  PROT 8.8* 7.3  ALBUMIN 4.5 3.3*   CBC:   Recent Labs Lab 06/02/13 0247 06/03/13 0510  WBC 10.6* 9.3  NEUTROABS 7.3 6.0  HGB 14.7 14.5  HCT 41.3 41.1  MCV 86.8 87.3  PLT 258 247   Coagulation:   Recent Labs Lab 05/27/13 1724  LABPROT 12.2  INR 0.92   Cardiac Enzymes:   Recent Labs Lab 05/30/13 1100 05/30/13 1640 05/30/13 2000 05/31/13 0150  CKTOTAL 87 84  --   --   CKMB 1.0 1.0  --   --   TROPONINI <0.30 <0.30 <0.30 <0.30   Urinalysis:   Recent Labs Lab 05/28/13 0842 05/30/13 1249  COLORURINE YELLOW YELLOW  LABSPEC 1.037* 1.039*  PHURINE 5.0 5.0  GLUCOSEU >1000* >1000*  HGBUR NEGATIVE NEGATIVE  BILIRUBINUR NEGATIVE SMALL*  KETONESUR 15* 40*  PROTEINUR NEGATIVE NEGATIVE  UROBILINOGEN 0.2 0.2  NITRITE NEGATIVE NEGATIVE  LEUKOCYTESUR NEGATIVE NEGATIVE   Lipid Panel    Component Value Date/Time   CHOL 201* 05/28/2013 0300   TRIG 145 05/28/2013 0300  HDL 42 05/28/2013 0300   CHOLHDL 4.8 05/28/2013 0300   VLDL 29 05/28/2013 0300   LDLCALC 130* 05/28/2013 0300   HgbA1C  Lab Results  Component Value Date   HGBA1C 14.1* 05/30/2013    Urine Drug Screen:     Component Value Date/Time   LABOPIA NONE DETECTED 10/04/2009 0314   COCAINSCRNUR NONE DETECTED 10/04/2009 0314   LABBENZ NONE DETECTED 10/04/2009 0314   AMPHETMU NONE DETECTED 10/04/2009 0314   THCU NONE DETECTED 10/04/2009 0314   LABBARB  Value: NONE DETECTED        DRUG SCREEN FOR MEDICAL PURPOSES ONLY.  IF CONFIRMATION IS NEEDED FOR ANY PURPOSE, NOTIFY LAB WITHIN 5 DAYS.        LOWEST DETECTABLE LIMITS FOR URINE DRUG SCREEN Drug Class       Cutoff (ng/mL) Amphetamine      1000 Barbiturate      200 Benzodiazepine   474 Tricyclics       259 Opiates          300 Cocaine          300 THC              50 10/04/2009 0314    Alcohol Level: No results found for this basename: ETH,  in the last 168 hours  Hypercoagulable  Workup Normal - C3, C4, RPR,  HIV, homocysteine, ANA, Protein S activity, Protein S total, lupus anticoagulant  ESR - 25 (H) ; anti-thrombin 3 - 125 (H) ; protein C activity -  200 (H);  protein C total - 165 (H); cardiolipin antibodies - (L); Total Complement  > 60 (H)   CT of the brain   05/29/2013 Stable left-sided infattions are noted. No significant changes nted compared to prior exam. 05/29/2013  Involving multifocal left MCA territory nonhemorrhagic infarcts. The area of low-attenuation the left basal ganglia and left insula are larger compared with prior exams.  05/27/2013   No acute intracranial pathology.     MRI of the brain  05/27/2013     Multiple foci of acute ischemia within the left middle cerebral artery territory. In addition, however punctate focus of acute ischemia within the left occipital lobe and right thalamus (both of which would be posterior circulation). All areas of ischemia have similar acuity. No hemorrhagic conversion.  Symmetric abnormal bright signal within the lenticulostriate nuclei (basal ganglia) in addition to abnormal temporoccipital periventricular T2 hyperintense signal. These findings may reflect sequelae of acute severe hypertension (PRES) but, it can also be seen with osmotic demyelination, autoimmune disorders such as scleroderma (which can have associated vasculopathy), less likely infectious encephalitis.  Moderate to severe white matter changes, in a distribution which can be seen with demyelination, with superimposed possible chronic small vessel ischemic disease though, are nonspecific.    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  Mild luminal irregularity of the intracranial vessels which can be seen with intracranial atherosclerosis though, may also be seen with vasculopathy.    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 No other intracranial flow-limiting stenosis or embolic disease evident. Areas of low density in the left MCA territory consistent with infarction. No hemorrhagic transformation. Question new low density in the pons, not definite.  2D Echocardiogram  EF 55-60% with no source of embolus.   Carotid Doppler  No evidence of hemodynamically  significant internal carotid artery stenosis. Vertebral artery flow is antegrade.   EEG - This is an abnormal EEG secondary to slowing over the left hemisphere with frequent left hemispheric sharp acitivty as well.. This finding is suggestive of a focal disturbance that is etiologically nonspecific, but may include a mass lesion or post-ictal focal slowing among other possibilities.   CXR  05/27/2013    No radiographic evidence of an acute cardiopulmonary process.    EKG  sinus tachycardia. For complete results please see formal report.    Therapy Recommendations inpatient rehabilitation.   Physical Exam   Middle aged  African american lady not in distress.Awake alert. Afebrile. Head is nontraumatic. Neck is supple without bruit.. Cardiac exam no murmur or gallop. Lungs are clear to auscultation. Distal pulses are well felt. Neurological Exam : Awake alert moderate expressive aphasia with significant word hesitancy and nonfluent speech. Unable to speak words and short sentences. Able to comprehend one  -step commands only. Unable to name or repeat. Extraocular movements are full range without nystagmus. Fundi were not visualized. Vision acuity seems adequate. Decreased blink to threat on the right compared to the left. Moderate right lower facial weakness. Tongue midline. Dense right upper extremity plegia with 0/5 strength and 2/5 strength in the right lower extremity and able to move it against gravity. Normal strength in the left side. Plantars both downgoing. Gait was not tested.  ASSESSMENT [name] is a 53 y.o. female  presenting with difficulty speaking. 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  Infarcts felt to be embolic secondary to unknown source.   On no antithrombotics prior to admission. Now on aspirin 300 mg suppository for secondary stroke prevention. Patient with resultant expressive and receptive aphasia, right field cut and right hemiplegia, dysphagia (tube feedings).   Patient with progresive neuro worsening in hospital in the setting of relative hypotension for her blood pressure, becoming symptomatic around 135, now with R arm > leg hemiparesis; right arm plegic.   LP done on arrival unrevealing.  Malignant hypertension - neuro status declined with lower blood pressures. Keep systolic blood pressure in the 180-200 range has the patient seems to be dependent on this pressure and has neurological worsening when blood pressure is close to the normal range Diabetes, HgbA1c 14.6, goal < 7.0  Hyperlipidemia, LDL 130, on no statin PTA, now on zocor 20 mg daily, goal LDL < 70 for diabetics EEG - slowing over the left hemisphere with frequent left hemispheric sharp acitivty. irritatbility secondary to stroke. No indication for anti-epileptics unless pt develops seizure. Chronic mild polycythemia, benign  Hospital day # 7  TREATMENT/PLAN  Continue aspirin for secondary stroke prevention.  Maintain elevated BP to prevent neuro worsening Do not recommend TEE this hospitalization d/t hypotension. Will consider TEE in the future, likely after discharge/rehab, may consider loop Disposition: Inpatient rehabilitation, they are following. Medically ready for transfer from the stroke standpoint.  Nothing further to add from stroke standpoint  Patient has a 10-15% risk of having another stroke over the next year, the highest risk is within 2 weeks of the most recent stroke/TIA (risk of having a  stroke following a stroke or TIA is the same).  Ongoing risk factor control by Primary Care Physician  D/w family member at bedisde  Stroke Service will sign off. Please call should any needs arise.  Follow up with Dr. Leonie Man,  Stroke Clinic, in 2 months.  Burnetta Sabin, MSN, RN, ANVP-BC, ANP-BC, [facility] Pager: [phone] 06/03/2013 9:51 AM  I have personally obtained a history, examined the patient, evaluated imaging results, and formulated the assessment and plan of care. I agree with the above. Antony Contras, MD

## 2013-06-03 NOTE — Progress Notes (Signed)
Inpatient Diabetes Program Recommendations  AACE/ADA: New Consensus Statement on Inpatient Glycemic Control (2013)  Target Ranges:  Prepandial:   less than 140 mg/dL      Peak postprandial:   less than 180 mg/dL (1-2 hours)      Critically ill patients:  140 - 180 mg/dL   Reason for Visit: Results for [name], [name] (MRN [id number]) as of 06/03/2013 13:54  Ref. Range 06/02/2013 20:07 06/02/2013 23:48 06/03/2013 03:40 06/03/2013 08:39 06/03/2013 11:48  Glucose-Capillary Latest Range: 70-99 mg/dL 239 (H) 255 (H) 234 (H) 276 (H) 245 (H)   Note that Lantus increased today to 13 units daily.  May also consider adding Novolog tube feed coverage 3 units q 4 hours to cover CHO in tube feeds. Text paged Dr. Tana Coast.  Thanks, Adah Perl, RN, BC-ADM Inpatient Diabetes Coordinator Pager [phone]

## 2013-06-03 NOTE — Progress Notes (Signed)
Rehab admissions - I met with pt and her son and further explained the possibility of inpatient rehab. Noted that Neuro has cleared pt for CIR. I paged attending Dr. Rai to seek medical clearance for inpatient rehab and spoke directly with MD. MD will get back to me later today once she has evaluated pt.  Bed is available and anticipate admitting pt later today to CIR pending Dr. Rai's medical clearance. Updated Brenda case manager as well. PT/family are aware of the plan as well.  Please call me with any questions. Thanks.   , PT Rehabilitation Admissions Coordinator [phone]   

## 2013-06-03 NOTE — Progress Notes (Signed)
Physical Therapy Treatment Patient Details Name: [name] MRN: [id number] DOB: [birth year] Today's Date: 06/03/2013 Time: 9381-0175 PT Time Calculation (min): 23 min  PT Assessment / Plan / Recommendation  History of Present Illness 53 y.o. female presenting with difficulty speaking. Per MRI: Multiple foci of acute ischemia within the left middle cerebral artery territory. In addition, however punctate focus of acute ischemia within the left occipital lobe and right thalamus (both of which would be posterior circulation); also PRES    PT Comments   Pt is progressing well with mobility and pre gait.  I would like to try hemi walker during her next session.  She needs frequent cues to redirect and is responding to yes/no questions inconsistently with me today, but with repetition, she seems to be improving.  Multimodal cues required for successful treatment.  PT will continue to follow acutely and the pt continues to be an excellent rehab candidate.    Follow Up Recommendations  CIR     Does the patient have the potential to tolerate intense rehabilitation    NA  Barriers to Discharge   None      Equipment Recommendations  Wheelchair (measurements PT);Wheelchair cushion (measurements PT);Other (comment) (hemi walker vs R PFRW)    Recommendations for Other Services   None  Frequency Min 4X/week   Progress towards PT Goals Progress towards PT goals: Progressing toward goals  Plan Current plan remains appropriate    Precautions / Restrictions Precautions Precautions: Fall Precaution Comments: right hemiperesis   Pertinent Vitals/Pain See vitals flow sheet.     Mobility  Bed Mobility Overal bed mobility: Needs Assistance Bed Mobility: Rolling;Sidelying to Sit Rolling: Min assist (with rail to the right) Sidelying to sit: Mod assist (HOB flat, with rail ) General bed mobility comments: Cues for technique and to initate movement.  Manual assist to find and reach for right  railing.  Attempted to get pt to push her right leg off of the side of the bed with her left, but this was too complicated at this time.   Transfers Overall transfer level: Needs assistance Equipment used: None Transfers: Sit to/from [facility] Sit to Stand: Mod assist Stand pivot transfers: Mod assist General transfer comment: Stood x 1 from the bed and x 1 from a lower recliner chair with verbal and tactile cues.  Manual facilitation to anteriorly weight shift and block right knee from buckling.  Pt able to take 1-2 steps to her left to get into the recliner chair with her left arm supported.   Modified Rankin (Stroke Patients Only) Pre-Morbid Rankin Score: No symptoms Modified Rankin: Severe disability      PT Goals (current goals can now be found in the care plan section) Acute Rehab PT Goals Patient Stated Goal: pt unable to state (aphasia)  Visit Information  Last PT Received On: 06/03/13 Assistance Needed: +2 (for attempts at gait and pre gait) History of Present Illness: 53 y.o. female presenting with difficulty speaking. Per MRI: Multiple foci of acute ischemia within the left middle cerebral artery territory. In addition, however punctate focus of acute ischemia within the left occipital lobe and right thalamus (both of which would be posterior circulation); also PRES     Subjective Data  Subjective: Pt nodding to yes no questions.  Inconsistant responses, though.   Patient Stated Goal: pt unable to state (aphasia)   Cognition  Cognition Arousal/Alertness: Awake/alert Behavior During Therapy: WFL for tasks assessed/performed Overall Cognitive Status: Impaired/Different from baseline Area of  Impairment: Attention;Following commands;Awareness;Problem solving Current Attention Level: Focused Following Commands: Follows one step commands inconsistently;Follows one step commands with increased time Safety/Judgement: Decreased awareness of safety;Decreased  awareness of deficits Awareness: Intellectual Problem Solving: Difficulty sequencing;Decreased initiation;Slow processing;Requires verbal cues;Requires tactile cues General Comments: Pt seems to lose focus quickl to task at hand.  Needs multimodal cues to complete command following ~50% of time.  Cues to attend to task, cues to attend to right side and for safety as pt is quick to move.   Difficult to assess due to: Impaired communication (expressive difficulty)    Balance  Balance Overall balance assessment: Needs assistance Sitting-balance support: Single extremity supported;Feet supported Sitting balance-Leahy Scale: Poor Sitting balance - Comments: Min assist up to mod assist while preforming dynamic tasks like reaching >6" outside of BOS or while scooting.  Sat EOB working on reaching all directions, adjusting socks (functional dynamic balance task) and command following/attention to task.   Postural control: Right lateral lean Standing balance support: Single extremity supported Standing balance-Leahy Scale: Poor Standing balance comment: mod assist to maintain standing balance for right leg support and trunk for balance and midline weight shift.  Worked on pre-gait in standing taking very small steps forward and back ward in place with right knee blocked.   General Comments General comments (skin integrity, edema, etc.): Pt is having difficulty keeping her saliva in her mouth and needs suctioning and washcloth.  Taught pt how to suction herself (questionable carryover as it required quite a bit of cueing and assist to help her preform).  Also educated on hooking strong foot behind weak foot and lifting her right arm with her left arm.    End of Session PT - End of Session Equipment Utilized During Treatment: Gait belt Activity Tolerance: Patient tolerated treatment well Patient left: in chair;with call bell/phone within reach     [place] [address]. [place], [place], DPT [phone]   06/03/2013,  3:47 PM

## 2013-06-03 NOTE — Progress Notes (Signed)
Patient ID: [name]  female  [id number]    DOB: 1960/12/21    DOA: 05/27/2013  PCP: No PCP Per Patient  Brief narrative:  53 year old female patient with known history of hypertension. Initially presented to the emergency department on 05/27/2013 with right-sided facial droop and aphasia. Time of onset unknown when she presented. Apparently last seen normal around 8:30 AM that morning. Initial CT was negative and her presenting blood pressure was markedly elevated at 205/121. She was evaluated by neurology who asked the critical care team to admit to the neuro ICU for stroke evaluation.  After admission subsequent MRI MRA of the brain revealed multiple foci of acute ischemia within the left middle cerebral artery territory as well as left occipital lobe and right thalamic regions. She was also found to have a left M1 occlusion without collateralization. Echocardiogram was unremarkable for possible embolic source but given presentation TEE was recommended by neurology.  She was transitioned out with stepdown orders and team 1 was to assume care of the patient on 05/30/2013 but on that same morning she became unresponsive while sitting upright in the chair with her blood pressure decreasing to 135 after a dose of labetalol. At that point her right side became flaccid but the symptoms improved with subsequent increase in blood pressure to a hypertensive range. CT demonstrated a stenotic M1 lesion in the left MCA but no new findings. EEG showed left focal abnormality. The patient was kept in the ICU.  Initially patient's recurrent episodes of lethargy and neuro changes were presumed related to PRES but now are attributed to relative hypotension in the setting of a patient with chronic severe malignant hypertension and it was felt allowing significantly high permissive hypertension would be appropriate for this patient. She was eventually appropriate to transfer to the neuro telemetry  floor.    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 - Patient was not consider TPA candidate due to delayed arrival, has persistent right-sided deficits  - Infarcts were felt to be embolic secondary to unknown source. Patient was not on any antibiotics prior to admission. -PT/OT evaluation recommended inpatient rehabilitation -LP, on arrival and Hypercoagulable workup unrevealing. - EEG showed slowing over the left hemisphere with frequent left hemispheric sharp activity - LDL 130, was not on any statin PTH, now on Zocor 20 mg daily, goal less than 70 -Discussed in detail with stroke service, Dr. Leonie Man, do not recommend TEE during this hospitalization due to hypotension. Will consider TEE in the future, likely after discharge/rehab, may consider loop  Active problems Aphasia complicating stroke  -Continue speech therapy   Hypertensive emergency/Malignant hypertension  -Per neurology, maintain elevated BP to prevent neuro worsening. Recommendation is to keep systolic blood pressure between 180 and 200 noting patient seems to be dependent on this pressure has neurological worsening when blood pressure is close to normal range  -Currently not on any antihypertensive medications   DM (diabetes mellitus) type II controlled, neurological manifestation  -Uncontrolled with hemoglobin A1c 14.6  -CBGs have increased with the addition of tube feeding so we'll adjust Lantus and sliding scale accordingly  -Tube feedings changed to Glucerna from Vital AF   Dysphagia  -Continue tube feedings through temporary tube  -Hopefully over time swallowing will improve to allow removal of feeding tube  Benign polycythemia   DVT  Prophylaxis:Heparin subcutaneous  Code Status:  Family Communication:  Disposition:  Consultants:  Nephrology/stroke service    inpatient rehabilitation  Procedures:  EEG   Antibiotics:  None    Subjective: Alert and mostly notes appropriately to questions., On tube feeds, right-sided hemiplegia   Objective: Weight change: 0.23 kg (8.1 oz)  Intake/Output Summary (Last 24 hours) at 06/03/13 1336 Last data filed at 06/03/13 0834  Gross per 24 hour  Intake      0 ml  Output      0 ml  Net      0 ml   Blood pressure 185/115, pulse 117, temperature 98.9 F (37.2 C), temperature source Oral, resp. rate 18, height 5\' 6"  (1.676 m), weight 72.53 kg (159 lb 14.4 oz), SpO2 97.00%.  Physical Exam: General: Alert and awake, oriented x3, not in any acute distress. CVS: S1-S2 clear, no murmur rubs or gallops Chest: clear to auscultation bilaterally, no wheezing, rales or rhonchi Abdomen: soft nontender, nondistended, normal bowel sounds  Extremities: no cyanosis, clubbing or edema noted bilaterally Neuro: right-sided hemiplegia   Lab Results: Basic Metabolic Panel:  Recent Labs Lab 06/02/13 0247 06/03/13 0510  NA 138 141  K 4.0 4.0  CL 100 99  CO2 24 26  GLUCOSE 274* 282*  BUN 15 16  CREATININE 0.69 0.68  CALCIUM 9.3 9.7  MG 1.9 2.0  PHOS 3.9 4.4   Liver Function Tests:  Recent Labs Lab 05/27/13 1724 05/30/13 1100  AST 16 25  ALT 14 11  ALKPHOS 120* 98  BILITOT 0.4 0.4  PROT 8.8* 7.3  ALBUMIN 4.5 3.3*   No results found for this basename: LIPASE, AMYLASE,  in the last 168 hours No results found for this basename: AMMONIA,  in the last 168 hours CBC:  Recent Labs Lab 06/02/13 0247 06/03/13 0510  WBC 10.6* 9.3  NEUTROABS 7.3 6.0  HGB 14.7 14.5  HCT 41.3 41.1  MCV 86.8 87.3  PLT 258 247   Cardiac Enzymes:  Recent Labs Lab 05/30/13 1100 05/30/13 1640 05/30/13 2000 05/31/13 0150  CKTOTAL 87 84  --   --   CKMB 1.0 1.0  --   --    TROPONINI <0.30 <0.30 <0.30 <0.30   BNP: No components found with this basename: POCBNP,  CBG:  Recent Labs Lab 06/02/13 2007 06/02/13 2348 06/03/13 0340 06/03/13 0839 06/03/13 1148  GLUCAP 239* 255* 234* 276* 245*     Micro Results: Recent Results (from the past 240 hour(s))  MRSA PCR SCREENING     Status: None   Collection Time    05/27/13 10:20 PM      Result Value Ref Range Status   MRSA by PCR NEGATIVE  NEGATIVE Final   Comment:            The GeneXpert MRSA Assay (FDA     approved for NASAL specimens     only), is one component of a     comprehensive MRSA colonization     surveillance program. It is not     intended to diagnose MRSA     infection nor to guide or     monitor treatment for     MRSA infections.  CSF CULTURE     Status: None   Collection Time    05/27/13 11:41 PM      Result Value Ref Range Status   Specimen Description CSF   Final   Special Requests Normal   Final  Gram Stain     Final   Value: CYTOSPIN SLIDE NO WBC SEEN     NO ORGANISMS SEEN     Performed at [facility]   Culture     Final   Value: NO GROWTH 3 DAYS     Performed at [facility]   Report Status 05/31/2013 FINAL   Final  CULTURE, BLOOD (ROUTINE X 2)     Status: None   Collection Time    05/30/13 11:00 AM      Result Value Ref Range Status   Specimen Description BLOOD RIGHT ARM   Final   Special Requests BOTTLES DRAWN AEROBIC ONLY 4CC   Final   Culture  Setup Time     Final   Value: 05/30/2013 14:31     Performed at [facility]   Culture     Final   Value:        BLOOD CULTURE RECEIVED NO GROWTH TO DATE CULTURE WILL BE HELD FOR 5 DAYS BEFORE ISSUING A FINAL NEGATIVE REPORT     Performed at [facility]   Report Status PENDING   Incomplete  CULTURE, BLOOD (ROUTINE X 2)     Status: None   Collection Time    05/30/13 11:10 AM      Result Value Ref Range Status   Specimen Description BLOOD RIGHT HAND   Final   Special Requests BOTTLES  DRAWN AEROBIC ONLY 5CC   Final   Culture  Setup Time     Final   Value: 05/30/2013 14:31     Performed at [facility]   Culture     Final   Value:        BLOOD CULTURE RECEIVED NO GROWTH TO DATE CULTURE WILL BE HELD FOR 5 DAYS BEFORE ISSUING A FINAL NEGATIVE REPORT     Performed at [facility]   Report Status PENDING   Incomplete  URINE CULTURE     Status: None   Collection Time    05/30/13 12:49 PM      Result Value Ref Range Status   Specimen Description URINE, CATHETERIZED   Final   Special Requests NONE   Final   Culture  Setup Time     Final   Value: 05/30/2013 16:39     Performed at [place]     Final   Value: NO GROWTH     Performed at [facility]   Culture     Final   Value: NO GROWTH     Performed at [facility]   Report Status 05/31/2013 FINAL   Final    Studies/Results: Ct Angio Head W/cm &/or Wo Cm  05/30/2013   CLINICAL DATA:  Stroke.  EXAM: CT ANGIOGRAPHY HEAD  TECHNIQUE: Multidetector CT imaging of the head was performed using the standard protocol during bolus administration of intravenous contrast. Multiplanar CT image reconstructions and MIPs were obtained to evaluate the vascular anatomy.  CONTRAST:  58mL OMNIPAQUE IOHEXOL 350 MG/ML SOLN  COMPARISON:  Head CT 05/29/2013.  MRI 05/27/2013.  FINDINGS: Head CT images show areas of low-density within the left frontoparietal region, basal ganglia and insular region consistent with recent infarctions. There is low density in the pons that appears more prominent than was seen previously and there could be recent brainstem insult. No hemorrhage or herniation.  Both internal carotid arteries are patent through the skullbase. There is atherosclerotic disease in the carotid siphon  regions but no flow-limiting stenosis. Both supra clinoid internal carotid arteries show moderate narrowing, 30-50%.  On the right, the anterior and middle cerebral arteries are patent without  proximal stenosis. Distal branch vessels appear normally opacified. On the left, there is normal appearing flow within the left anterior cerebral artery territory. There is severe stenosis in the M1 segment on the left with markedly diminished opacification of the more distal left MCA branch vessels.  Both vertebral arteries are patent to the basilar. No basilar stenosis. Posterior circulation branch vessels are patent. There is atherosclerotic irregularity of the posterior cerebral arteries and superior cerebellar arteries.  Review of the MIP images confirms the above findings.  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 No other intracranial flow-limiting stenosis or embolic disease evident.  Areas of low density in the left MCA territory consistent with infarction. No hemorrhagic transformation. Question new low density in the pons, not definite.   Electronically Signed   By: Nelson Chimes M.D.   On: 05/30/2013 12:19   Dg Chest 2 View  05/27/2013   CLINICAL DATA Stroke symptoms, difficulty with speech.  EXAM CHEST  2 VIEW  COMPARISON 11/15/2006  FINDINGS Mild aortic tortuosity. Normal heart size. No confluent airspace opacity. No pleural effusion or pneumothorax. Mild multilevel degenerative change without acute osseous finding.  IMPRESSION No radiographic evidence of an acute cardiopulmonary process.  SIGNATURE  Electronically Signed   By: Carlos Levering M.D.   On: 05/27/2013 22:33   Dg Abd 1 View  05/30/2013   CLINICAL DATA:  NG tube placement  EXAM: ABDOMEN - 1 VIEW  COMPARISON:  None.  FINDINGS: The bowel gas pattern is normal. Residual contrast is identified in the renal calices. A nasogastric tube is identified coiled in the stomach with distal tip in the mid esophagus. Repositioning is recommended  IMPRESSION: A nasogastric tube is identified coiled in the stomach with distal tip in the  mid esophagus. Repositioning is recommended   Electronically Signed   By: Abelardo Diesel M.D.   On: 05/30/2013 13:24   Ct Head Wo Contrast  05/29/2013   CLINICAL DATA:  Worsening headache.  EXAM: CT HEAD WITHOUT CONTRAST  TECHNIQUE: Contiguous axial images were obtained from the base of the skull through the vertex without intravenous contrast.  COMPARISON:  CT scan of May 29, 2013.  FINDINGS: Bony calvarium appears intact. No midline shift is noted. Ventricular size is within normal limits. 15 x 12 mm rounded low density is noted in left basal ganglia consistent with infarction. 22 x 20 mm rounded low density is noted in left periventricular white matter consistent with infarction. Stable left frontal lobe density is noted as well. These are unchanged compared to prior exam. There is no evidence of hemorrhage or mass lesion.  IMPRESSION: Stable left-sided infarctions are noted. No significant changes noted compared to prior exam.   Electronically Signed   By: Sabino Dick M.D.   On: 05/29/2013 20:22   Ct Head Wo Contrast  05/29/2013   CLINICAL DATA:  Right-sided weakness  EXAM: CT HEAD WITHOUT CONTRAST  TECHNIQUE: Contiguous axial images were obtained from the base of the skull through the vertex without intravenous contrast.  COMPARISON:  MR HEAD W/O CM dated 05/27/2013; MR MRA HEAD W/O CM dated 05/27/2013; CT HEAD W/O CM dated 05/27/2013; CT HEAD W/O CM dated 10/04/2009  FINDINGS: There is no evidence of mass effect,  midline shift or extra-axial fluid collections. There is no evidence of a space-occupying lesion or intracranial hemorrhage. There are areas of low attenuation in the left frontal lobe, left basal ganglia, left insula and small foci in the left parietal lobe consistent with multiple infarcts.  The ventricles and sulci are appropriate for the patient's age. The basal cisterns are patent.  Visualized portions of the orbits are unremarkable. The visualized portions of the paranasal sinuses and  mastoid air cells are unremarkable.  The osseous structures are unremarkable.  IMPRESSION: Involving multifocal left MCA territory nonhemorrhagic infarcts. The area of low-attenuation the left basal ganglia and left insula are larger compared with prior exams.   Electronically Signed   By: Kathreen Devoid   On: 05/29/2013 13:57   Ct Head (brain) Wo Contrast  05/27/2013   CLINICAL DATA Slurred speech, right facial droop  EXAM CT HEAD WITHOUT CONTRAST  TECHNIQUE Contiguous axial images were obtained from the base of the skull through the vertex without intravenous contrast.  COMPARISON CT HEAD W/O CM dated 10/04/2009; CT HEAD W/O CM dated 02/02/2007; CT HEAD W/O CM dated 11/15/2006  FINDINGS There is no evidence of mass effect, midline shift, or extra-axial fluid collections. There is no evidence of a space-occupying lesion or intracranial hemorrhage. There is no evidence of a cortical-based area of acute infarction. There is periventricular white matter low attenuation likely secondary to microangiopathy.  The ventricles and sulci are appropriate for the patient's age. The basal cisterns are patent.  Visualized portions of the orbits are unremarkable. The visualized portions of the paranasal sinuses and mastoid air cells are unremarkable.  The osseous structures are unremarkable.  IMPRESSION No acute intracranial pathology.  SIGNATURE  Electronically Signed   By: Kathreen Devoid   On: 05/27/2013 18:19   Mr Angiogram Head Wo Contrast  05/27/2013   CLINICAL DATA Right facial droop, slurred speech.  EXAM MRI HEAD WITHOUT CONTRAST  MRA HEAD WITHOUT CONTRAST  TECHNIQUE Multiplanar, multiecho pulse sequences of the brain and surrounding structures were obtained without intravenous contrast. Angiographic images of the head were obtained using MRA technique without contrast.  COMPARISON CT HEAD W/O CM dated 05/27/2013; CT HEAD W/O CM dated 10/04/2009  FINDINGS MRI HEAD FINDINGS  Multiple foci of reduced diffusion in the left  frontal temporal lobe, including 16 x 14 mm lesion and left corona radiata, 20 x 10 mm lesion in the high left frontal lobe at the gray-white matter junction. In addition, punctate focus of reduced diffusion within the right medial thalamus, and left occipital lobe. All foci show low ADC values in very mild FLAIR hyperintense signal, all appearing of similar age. No susceptibility artifact to suggest hemorrhage.  The ventricles and sulci are overall normal for patient's age. Scattered patchy supratentorial white matter T2 hyperintensities are far greater than expected for age, many which appear to radiate from the periventricular margin. In addition, symmetric homogenous bright T2 signal within the bilateral lenticulostriate nuclei, axial 13 /22. Patchy bright signal within the pons. However there is confluent periventricular white matter FLAIR T2 hyperintensity about the temporal horns and occipital horns. No midline shift.  No abnormal extra-axial fluid collections. Ocular globes and orbital contents are nonsuspicious though not tailored for evaluation. No abnormal sellar expansion or cerebellar tonsillar ectopia. The visualized paranasal sinuses and mastoid air cells are appear well aerated.  MRA HEAD FINDINGS  Anterior circulation: Normal flow related enhancement of the included cervical, petrous, cavernous and supra clinoid internal carotid arteries. Patent anterior communicating  artery. Normal flow related enhancement of the anterior cerebral arteries, including more distal segments. Abrupt cut off of the left M1 segment, with absent distal left middle cerebral artery vessels. Patent right middle cerebral artery and distal segments, however there is somewhat distal segment luminal irregularity.  Posterior circulation: Left Vertebral artery is dominant. Basilar artery is patent, with normal flow related enhancement of the main branch vessels. Normal flow related enhancement of the posterior cerebral arteries  centrally, however there is relative paucity of right P3 branches compared to the left. Mildly irregular P2 segments, to lesser extent left P3 segment.  No hemodynamically significant stenosis, aneurysm within the anterior nor posterior circulation.  IMPRESSION MRI head: Multiple foci of acute ischemia within the left middle cerebral artery territory. In addition, however punctate focus of acute ischemia within the left occipital lobe and right thalamus (both of which would be posterior circulation). All areas of ischemia have similar acuity. No hemorrhagic conversion.  Symmetric abnormal bright signal within the lenticulostriate nuclei (basal ganglia) in addition to abnormal temporoccipital periventricular T2 hyperintense signal. These findings may reflect sequelae of acute severe hypertension (PRES) but, it can also be seen with osmotic demyelination, autoimmune disorders such as scleroderma (which can have associated vasculopathy), less likely infectious encephalitis.  Moderate to severe white matter changes, in a distribution which can be seen with demyelination, with superimposed possible chronic small vessel ischemic disease though, are nonspecific.  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  Mild luminal irregularity of the intracranial vessels which can be seen with intracranial atherosclerosis though, may also be seen with vasculopathy.  Critical Value/emergent results were called by telephone at the time of interpretation on 05/27/2013 at 10:28 PM to Dr. Calvert Cantor , who verbally acknowledged these results.  SIGNATURE  Electronically Signed   By: Elon Alas   On: 05/27/2013 22:32   [name] Contrast  05/27/2013   CLINICAL DATA Right facial droop, slurred speech.  EXAM MRI HEAD WITHOUT CONTRAST  MRA HEAD WITHOUT CONTRAST  TECHNIQUE Multiplanar, multiecho pulse sequences of the brain and surrounding structures were obtained without intravenous contrast. Angiographic images of  the head were obtained using MRA technique without contrast.  COMPARISON CT HEAD W/O CM dated 05/27/2013; CT HEAD W/O CM dated 10/04/2009  FINDINGS MRI HEAD FINDINGS  Multiple foci of reduced diffusion in the left frontal temporal lobe, including 16 x 14 mm lesion and left corona radiata, 20 x 10 mm lesion in the high left frontal lobe at the gray-white matter junction. In addition, punctate focus of reduced diffusion within the right medial thalamus, and left occipital lobe. All foci show low ADC values in very mild FLAIR hyperintense signal, all appearing of similar age. No susceptibility artifact to suggest hemorrhage.  The ventricles and sulci are overall normal for patient's age. Scattered patchy supratentorial white matter T2 hyperintensities are far greater than expected for age, many which appear to radiate from the periventricular margin. In addition, symmetric homogenous bright T2 signal within the bilateral lenticulostriate nuclei, axial 13 /22. Patchy bright signal within the pons. However there is confluent periventricular white matter FLAIR T2 hyperintensity about the temporal horns and occipital horns. No midline shift.  No abnormal extra-axial fluid collections. Ocular globes and orbital contents are nonsuspicious though not tailored for evaluation. No abnormal sellar expansion or cerebellar tonsillar ectopia. The visualized paranasal sinuses and mastoid air cells are appear well aerated.  MRA HEAD FINDINGS  Anterior circulation: Normal flow related enhancement of the included cervical,  petrous, cavernous and supra clinoid internal carotid arteries. Patent anterior communicating artery. Normal flow related enhancement of the anterior cerebral arteries, including more distal segments. Abrupt cut off of the left M1 segment, with absent distal left middle cerebral artery vessels. Patent right middle cerebral artery and distal segments, however there is somewhat distal segment luminal irregularity.   Posterior circulation: Left Vertebral artery is dominant. Basilar artery is patent, with normal flow related enhancement of the main branch vessels. Normal flow related enhancement of the posterior cerebral arteries centrally, however there is relative paucity of right P3 branches compared to the left. Mildly irregular P2 segments, to lesser extent left P3 segment.  No hemodynamically significant stenosis, aneurysm within the anterior nor posterior circulation.  IMPRESSION MRI head: Multiple foci of acute ischemia within the left middle cerebral artery territory. In addition, however punctate focus of acute ischemia within the left occipital lobe and right thalamus (both of which would be posterior circulation). All areas of ischemia have similar acuity. No hemorrhagic conversion.  Symmetric abnormal bright signal within the lenticulostriate nuclei (basal ganglia) in addition to abnormal temporoccipital periventricular T2 hyperintense signal. These findings may reflect sequelae of acute severe hypertension (PRES) but, it can also be seen with osmotic demyelination, autoimmune disorders such as scleroderma (which can have associated vasculopathy), less likely infectious encephalitis.  Moderate to severe white matter changes, in a distribution which can be seen with demyelination, with superimposed possible chronic small vessel ischemic disease though, are nonspecific.  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  Mild luminal irregularity of the intracranial vessels which can be seen with intracranial atherosclerosis though, may also be seen with vasculopathy.  Critical Value/emergent results were called by telephone at the time of interpretation on 05/27/2013 at 10:28 PM to Dr. Calvert Cantor , who verbally acknowledged these results.  SIGNATURE  Electronically Signed   By: Elon Alas   On: 05/27/2013 22:32    Medications: Scheduled Meds: . aspirin  300 mg Rectal Daily  . heparin  5,000  Units Subcutaneous 3 times per day  . insulin aspart  0-15 Units Subcutaneous 6 times per day  . insulin glargine  10 Units Subcutaneous QHS  . nystatin  5 mL Oral QID  . simvastatin  20 mg Per Tube q1800      LOS: 7 days   Maday Guarino M.D. [facility] 06/03/2013, 1:36 PM Pager: [phone]  If 7PM-7AM, please contact night-coverage [email or web address] Password [email or web address]

## 2013-06-03 NOTE — PMR Pre-admission (Signed)
PMR Admission Coordinator Pre-Admission Assessment  Patient: [name] is an 53 y.o., female MRN: [id number] DOB: Feb 04, 1961 Height: 5\' 6"  (167.6 cm) Weight: 73.483 kg (162 lb)              Insurance Information HMO:     PPO:      PCP:      IPA:      80/20:      OTHER:  PRIMARY: Medicaid potential Pt's son and daughter would like assistance to complete the Medicaid application and possibly filing pt for disability.     Emergency Contact Information Contact Information   Name Relation Home Work Mobile   Panacea Daughter   [phone]   Donivan Scull   [phone]   Tiyana, Galla   [phone]     Current Medical History  Patient Admitting Diagnosis:embolic cva, ?PRES (Per Neuro: Acute CVA: multiple bilateral, most in the R MCA but also R occipital and L thalamus infarcts w/ resultant expressive asphasia and right facial droop.)      History of Present Illness: [name] is a 53 y.o. female with history of HTN, DM, bipolar disorder, who was admitted on 05/27/13 with right facial droop and aphasia. 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 2 D echo with EF 55-60%. Carotid dopplers without significant ICA stenosis. 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 Patient with progressive neurologic worsening on 05/29/13 pm with RUE >RLE weakness. Started on hypertonic fluids and transferred to ICU for closer monitoring. Follow up CCT without significant changes. TEE ordered for work up but will be completed at a later point. EEG pending.  MD/Rehab team recommending CIR.  Recommendation is to  keep systolic blood pressure between 180 and 200 noting patient seems to be dependent on this pressure has neurological worsening when blood pressure is close to normal range.   Initially patient's recurrent episodes of lethargy and neuro changes were presumed related to PRES but now are attributed to relative hypotension in the setting of a patient with chronic severe malignant hypertension and it was felt allowing significantly high permissive hypertension would be appropriate for this patient. She was eventually appropriate to transfer to the neuro telemetry floor. Received medical clearance from Dr. Tyrell Antonio, internal medicine for acute inpatient rehab.   NIH Total: 24  Past Medical History  Past Medical History  Diagnosis Date  . Hypertension   . Depression   . Diabetes mellitus     ?type (05/29/2013)  . Bipolar affective disorder   . Schizophrenia     [name] 11/28/2007 (05/29/2013)  . Stroke 05/27/2013    Family History  family history is not on file.  Prior Rehab/Hospitalizations: no prior hospitalizations/rehab   Current Medications  Current facility-administered medications:0.9 %  sodium chloride infusion, , Intravenous, Continuous, Brand Males, MD;  alum & mag hydroxide-simeth (MAALOX/MYLANTA) 200-200-20 MG/5ML suspension 30 mL, 30 mL, Oral, PRN, Doree Fudge, MD, 30 mL at 06/01/13 2131;  aspirin suppository 300 mg, 300 mg, Rectal, Daily, Samella Parr, NP, 300 mg at 06/03/13 8182 feeding supplement (GLUCERNA 1.2 CAL) liquid 1,000 mL, 1,000 mL, Per Tube, Continuous, Dalene Carrow, RD, Last Rate: 60 mL/hr at 06/04/13  7253, 1,000 mL at 06/04/13 0655;  heparin injection 5,000 Units, 5,000 Units, Subcutaneous, 3 times per day, Erick Colace, NP, 5,000 Units at 06/04/13 0524;  insulin aspart (novoLOG) injection 0-15 Units, 0-15 Units, Subcutaneous, 6 times per day, Wilhelmina Mcardle, MD, 8 Units at 06/04/13 0856 insulin glargine (LANTUS) injection 13 Units,  13 Units, Subcutaneous, QHS, Ripudeep K Rai, MD, 13 Units at 06/03/13 2225;  morphine 2 MG/ML injection 2 mg, 2 mg, Intravenous, Q2H PRN, Doree Fudge, MD, 2 mg at 06/03/13 1217;  nystatin (MYCOSTATIN) 100000 UNIT/ML suspension 500,000 Units, 5 mL, Oral, QID, Samella Parr, NP, 500,000 Units at 06/03/13 2225 simvastatin (ZOCOR) tablet 20 mg, 20 mg, Per Tube, q1800, David L Rinehuls, PA-C, 20 mg at 06/02/13 1707  Patients Current Diet: NPO, currently receiving all meds/nutrition through [facility]  Precautions / Restrictions Precautions Precautions: Fall Precaution Comments: right hemiperesis Restrictions Weight Bearing Restrictions: No   Prior Activity Level Community (5-7x/wk): 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  Current Functional Level Cognition  Arousal/Alertness: Awake/alert Overall Cognitive Status: Impaired/Different from baseline Difficult to assess due to: Impaired communication (expressive difficulty) Current Attention Level: Focused Orientation Level: Oriented to person Following Commands: Follows one step commands inconsistently;Follows one step commands with increased time Safety/Judgement: Decreased awareness of safety;Decreased awareness of deficits General Comments: Pt seems to lose focus quickl to task at hand.  Needs multimodal cues to complete command following ~50% of time.  Cues to attend to task, cues to attend to right side and for safety as pt is quick to move.   Attention: Focused;Sustained Focused Attention: Appears intact Sustained Attention: Impaired Sustained Attention Impairment: Verbal basic;Functional basic Awareness:  (right  sided inattention vs visual field cut) Comments: difficulty to formally assess due to severity of apraxia/aphasia. will continue to assess    Extremity Assessment (includes Sensation/Coordination)          ADLs  Eating/Feeding: NPO  Grooming: Minimal assistance Where Assessed - Grooming: Unsupported sitting Upper Body Bathing: Moderate assistance Where Assessed - Upper Body Bathing: Unsupported sitting Lower Body Bathing: Maximal assistance Where Assessed - Lower Body Bathing: Supported sit to stand Upper Body Dressing: +1 Total assistance Where Assessed - Upper Body Dressing: Unsupported sitting Lower Body Dressing: +1 Total assistance Where Assessed - Lower Body Dressing: Supported sit to stand Toilet Transfer: +2 Total assistance;Moderate assistance Toilet Transfer Method: Sit to stand;Stand pivot (going to pt's left) Toilet Transfer Equipment:  (bed>recliner) Toileting - Clothing Manipulation and Hygiene: +1 Total assistance Where Assessed - Toileting Clothing Manipulation and Hygiene: Sit to stand from 3-in-1 or toilet Equipment Used: Gait belt Transfers/Ambulation Related to ADLs: see mobility section ADL Comments: Pt needs multi-modal cues (verbal, gestural, tactile)      Mobility  Overal bed mobility: Needs Assistance Bed Mobility: Rolling;Sidelying to Sit Rolling: Min assist (with rail to the right) Sidelying to sit: Mod assist (HOB flat, with rail ) Supine to sit: Min assist General bed mobility comments: Cues for technique and to initate movement.  Manual assist to find and reach for right railing.  Attempted to get pt to push her right leg off of the side of the bed with her left, but this was too complicated at this time.  Transfers  Overall transfer level: Needs assistance Equipment used: None Transfers: Sit to/from [facility] Sit to Stand: Mod assist Stand pivot transfers: Mod assist General transfer comment: Stood x 1 from the bed and x  1 from a lower recliner chair with verbal and tactile cues.  Manual facilitation to anteriorly weight shift and block right knee from buckling.  Pt able to take 1-2 steps to her left to get into the recliner chair with her left arm supported.      Ambulation / Gait / Stairs / Wheelchair Mobility  Not assessed at this time   Posture / Balance Dynamic Sitting Balance Sitting balance - Comments: Min assist up to mod assist while preforming dynamic tasks like reaching >6" outside of BOS or while scooting.  Sat EOB working on reaching all directions, adjusting socks (functional dynamic balance task) and command following/attention to task.      Special needs/care consideration BiPAP/CPAP no CPM no Continuous Drip IV no Dialysis no         Life Vest no Oxygen no Special Bed no Trach Size no  Wound Vac (area) no      Skin no issues                               Bowel mgmt: last BM on 06-02-13 Bladder mgmt: incontinent using bed pan Diabetic mgmt - was having trouble managing her DM at home, had trouble getting the medications per dtr. Note: use of [employer] at times due to secretions    Previous Home Environment Living Arrangements: Alone;Children Available Help at Discharge: Family;Available PRN/intermittently Type of Home: House Home Layout: One level Home Access: Stairs to enter Entrance Stairs-Rails: Can reach both Entrance Stairs-Number of Steps: several Home Care Services: No  Discharge Living Setting Plans for Discharge Living Setting: Patient's home Type of Home at Discharge: House (one story duplex) Discharge Home Layout: One level Discharge Home Access: Stairs to enter Entrance Stairs-Number of Steps: 4 Does the patient have any problems obtaining your medications?: No  Social/Family/Support Systems Patient Roles: Other (Comment);Volunteer (was working part time in a program with medical records) Contact Information: Dtr Insurance account manager and son Lennette Bihari Anticipated Caregiver: dtr  will be primary caregiver, son will help as needed. Son reports that he drives trucks but will now plan to stay at home to help for awhile. Anticipated Caregiver's Contact Information: see above Ability/Limitations of Caregiver: dtr is taking classes and will not be avail 24-7 but son is planning to help Caregiver Availability: 24/7 Discharge Plan Discussed with Primary Caregiver: Yes (discussed w/ dtr by phone and son in person on 3-17 and on 3-18) Is Caregiver In Agreement with Plan?: Yes Does Caregiver/Family have Issues with Lodging/Transportation while Pt is in Rehab?:  no  Goals/Additional Needs Patient/Family Goal for Rehab: minA to supervision with PT/OT; supervision with SLP  Expected length of stay: 14-20 days Cultural Considerations: none Dietary Needs: NPO (pt currently receiving feedings from [facility]) Equipment Needs: to be determined Pt/Family Agrees to Admission and willing to participate: Yes (spoke with dtr by phone on 3-16; spoke in person w/ son 3-17 and 3-18) Program Orientation Provided & Reviewed with Pt/Caregiver Including Roles  & Responsibilities: Yes   Decrease burden of Care through IP rehab admission: NA   Possible need for SNF placement upon discharge:not anticipated    Patient Condition: This patient's medical and functional status has changed since the consult dated: 05-30-13 in which  the Rehabilitation Physician determined and documented that the patient's condition is appropriate for intensive rehabilitative care in an inpatient rehabilitation facility. See "History of Present Illness" (above) for medical update. Functional changes are: moderate assistance for limited stand pivot transfers to chair. Patient's medical and functional status update has been discussed with the Rehabilitation physician and patient remains appropriate for inpatient rehabilitation. Will admit to inpatient rehab today.  Preadmission Screen Completed By:  Nanetta Batty, PT, 06/04/2013  9:47 AM ______________________________________________________________________   Discussed status with Dr. Naaman Plummer on 06-04-13 at [phone] and received telephone approval for admission today.  Admission Coordinator:  Anne Hahn, time0949/Date 06-04-13

## 2013-06-03 NOTE — Progress Notes (Signed)
Rehab admissions - I did receive medical clearance from Dr. Tana Coast for pt to come to inpatient rehab. However, the last available bed for today in CIR became unexpectedly unavailable due to unforeseen medical issues with another pt that did not DC as planned.   I will have a bed tomorrow and will plan to admit pt then. I updated Dr. Tana Coast by phone and text, Hassan Rowan case manager and pt's son Lennette Bihari by phone.  Please call me with any questions and thank you for your understanding. I will check on pt status tomorrow morning and will proceed from there.  Nanetta Batty, PT Rehabilitation Admissions Coordinator [phone]

## 2013-06-04 ENCOUNTER — Encounter (HOSPITAL_COMMUNITY): Payer: Self-pay | Admitting: General Practice

## 2013-06-04 ENCOUNTER — Inpatient Hospital Stay (HOSPITAL_COMMUNITY)
Admission: RE | Admit: 2013-06-04 | Discharge: 2013-07-08 | DRG: 945 | Disposition: A | Discharge status: Home Health | Payer: Medicaid Other | Source: Intra-hospital | Attending: Physical Medicine & Rehabilitation | Admitting: Physical Medicine & Rehabilitation

## 2013-06-04 DIAGNOSIS — F200 Paranoid schizophrenia: Secondary | ICD-10-CM | POA: Diagnosis present

## 2013-06-04 DIAGNOSIS — Z91199 Patient's noncompliance with other medical treatment and regimen due to unspecified reason: Secondary | ICD-10-CM

## 2013-06-04 DIAGNOSIS — I161 Hypertensive emergency: Secondary | ICD-10-CM

## 2013-06-04 DIAGNOSIS — Z8673 Personal history of transient ischemic attack (TIA), and cerebral infarction without residual deficits: Secondary | ICD-10-CM | POA: Diagnosis not present

## 2013-06-04 DIAGNOSIS — E119 Type 2 diabetes mellitus without complications: Secondary | ICD-10-CM | POA: Diagnosis present

## 2013-06-04 DIAGNOSIS — Z5189 Encounter for other specified aftercare: Secondary | ICD-10-CM | POA: Diagnosis present

## 2013-06-04 DIAGNOSIS — G8190 Hemiplegia, unspecified affecting unspecified side: Secondary | ICD-10-CM | POA: Diagnosis present

## 2013-06-04 DIAGNOSIS — I10 Essential (primary) hypertension: Secondary | ICD-10-CM | POA: Diagnosis present

## 2013-06-04 DIAGNOSIS — M62838 Other muscle spasm: Secondary | ICD-10-CM | POA: Diagnosis not present

## 2013-06-04 DIAGNOSIS — R000 Tachycardia, unspecified: Secondary | ICD-10-CM | POA: Diagnosis present

## 2013-06-04 DIAGNOSIS — Z9851 Tubal ligation status: Secondary | ICD-10-CM | POA: Diagnosis not present

## 2013-06-04 DIAGNOSIS — IMO0002 Reserved for concepts with insufficient information to code with codable children: Secondary | ICD-10-CM

## 2013-06-04 DIAGNOSIS — I69959 Hemiplegia and hemiparesis following unspecified cerebrovascular disease affecting unspecified side: Secondary | ICD-10-CM

## 2013-06-04 DIAGNOSIS — I6340 Cerebral infarction due to embolism of unspecified cerebral artery: Secondary | ICD-10-CM | POA: Diagnosis present

## 2013-06-04 DIAGNOSIS — Z9119 Patient's noncompliance with other medical treatment and regimen: Secondary | ICD-10-CM | POA: Diagnosis not present

## 2013-06-04 DIAGNOSIS — F319 Bipolar disorder, unspecified: Secondary | ICD-10-CM | POA: Diagnosis present

## 2013-06-04 DIAGNOSIS — I69920 Aphasia following unspecified cerebrovascular disease: Secondary | ICD-10-CM

## 2013-06-04 DIAGNOSIS — R29810 Facial weakness: Secondary | ICD-10-CM | POA: Diagnosis present

## 2013-06-04 DIAGNOSIS — G9349 Other encephalopathy: Secondary | ICD-10-CM | POA: Diagnosis present

## 2013-06-04 DIAGNOSIS — R1310 Dysphagia, unspecified: Secondary | ICD-10-CM | POA: Diagnosis present

## 2013-06-04 DIAGNOSIS — R4701 Aphasia: Secondary | ICD-10-CM | POA: Diagnosis present

## 2013-06-04 DIAGNOSIS — E46 Unspecified protein-calorie malnutrition: Secondary | ICD-10-CM | POA: Diagnosis present

## 2013-06-04 DIAGNOSIS — B370 Candidal stomatitis: Secondary | ICD-10-CM | POA: Diagnosis not present

## 2013-06-04 DIAGNOSIS — E1149 Type 2 diabetes mellitus with other diabetic neurological complication: Secondary | ICD-10-CM | POA: Diagnosis present

## 2013-06-04 DIAGNOSIS — I69991 Dysphagia following unspecified cerebrovascular disease: Secondary | ICD-10-CM

## 2013-06-04 DIAGNOSIS — I639 Cerebral infarction, unspecified: Secondary | ICD-10-CM | POA: Diagnosis present

## 2013-06-04 LAB — GLUCOSE, CAPILLARY
GLUCOSE-CAPILLARY: 259 mg/dL — AB (ref 70–99)
GLUCOSE-CAPILLARY: 275 mg/dL — AB (ref 70–99)
GLUCOSE-CAPILLARY: 263 mg/dL — AB (ref 70–99)
Glucose-Capillary: 230 mg/dL — ABNORMAL HIGH (ref 70–99)
Glucose-Capillary: 214 mg/dL — ABNORMAL HIGH (ref 70–99)
Glucose-Capillary: 271 mg/dL — ABNORMAL HIGH (ref 70–99)

## 2013-06-04 LAB — BASIC METABOLIC PANEL
BUN: 15 mg/dL (ref 6–23)
CO2: 27 mEq/L (ref 19–32)
Calcium: 9.8 mg/dL (ref 8.4–10.5)
Chloride: 99 mEq/L (ref 96–112)
Creatinine, Ser: 0.69 mg/dL (ref 0.50–1.10)
GFR calc Af Amer: >90 mL/min (ref >90)
GLUCOSE: 250 mg/dL — AB (ref 70–99)
POTASSIUM: 4 meq/L (ref 3.7–5.3)
Sodium: 141 mEq/L (ref 137–147)

## 2013-06-04 LAB — CBC WITH DIFFERENTIAL/PLATELET
BASOS ABS: 0 10*3/uL (ref 0.0–0.1)
BASOS PCT: 0 % (ref 0–1)
Eosinophils Absolute: 0.2 10*3/uL (ref 0.0–0.7)
Eosinophils Relative: 2 % (ref 0–5)
HEMATOCRIT: 41.1 % (ref 36.0–46.0)
Hemoglobin: 14.4 g/dL (ref 12.0–15.0)
Lymphocytes Relative: 25 % (ref 12–46)
Lymphs Abs: 2.5 10*3/uL (ref 0.7–4.0)
MCH: 31 pg (ref 26.0–34.0)
MCHC: 35 g/dL (ref 30.0–36.0)
MCV: 88.4 fL (ref 78.0–100.0)
Monocytes Absolute: 1.1 10*3/uL — ABNORMAL HIGH (ref 0.1–1.0)
Monocytes Relative: 11 % (ref 3–12)
NEUTROS ABS: 6.3 10*3/uL (ref 1.7–7.7)
Neutrophils Relative %: 63 % (ref 43–77)
Platelets: 279 10*3/uL (ref 150–400)
RBC: 4.65 MIL/uL (ref 3.87–5.11)
RDW: 12.1 % (ref 11.5–15.5)
WBC: 10 10*3/uL (ref 4.0–10.5)

## 2013-06-04 LAB — PHOSPHORUS: PHOSPHORUS: 4.5 mg/dL (ref 2.3–4.6)

## 2013-06-04 LAB — MAGNESIUM: MAGNESIUM: 2 mg/dL (ref 1.5–2.5)

## 2013-06-04 MED ORDER — GUAIFENESIN-DM 100-10 MG/5ML PO SYRP
5.0000 mL | ORAL_SOLUTION | Freq: Four times a day (QID) | ORAL | Status: DC | PRN
  Administered 2013-06-14: 10 mL via ORAL
  Filled 2013-06-04: qty 10

## 2013-06-04 MED ORDER — ALUM & MAG HYDROXIDE-SIMETH 200-200-20 MG/5ML PO SUSP: 30.0000 mL | ORAL | Status: DC | PRN

## 2013-06-04 MED ORDER — ACETAMINOPHEN 325 MG PO TABS
325.0000 mg | ORAL_TABLET | ORAL | Status: DC | PRN
  Administered 2013-06-04 – 2013-06-20 (×8): 650 mg via ORAL
  Administered 2013-07-03: 325 mg via ORAL
  Administered 2013-07-05: 650 mg via ORAL
  Administered 2013-07-05: 325 mg via ORAL
  Filled 2013-06-04: qty 1
  Filled 2013-06-04 (×8): qty 2
  Filled 2013-06-04: qty 1
  Filled 2013-06-04: qty 2

## 2013-06-04 MED ORDER — TRAZODONE HCL 50 MG PO TABS
25.0000 mg | ORAL_TABLET | Freq: Every evening | ORAL | Status: DC | PRN
  Administered 2013-06-06 – 2013-06-27 (×8): 50 mg via ORAL
  Filled 2013-06-04 (×8): qty 1

## 2013-06-04 MED ORDER — CLONIDINE HCL 0.1 MG PO TABS
0.1000 mg | ORAL_TABLET | Freq: Once | ORAL | Status: AC
  Administered 2013-06-04: 0.1 mg via ORAL
  Filled 2013-06-04: qty 1

## 2013-06-04 MED ORDER — SIMVASTATIN 20 MG PO TABS
20.0000 mg | ORAL_TABLET | Freq: Every day | ORAL | Status: DC
  Administered 2013-06-05 – 2013-07-07 (×32): 20 mg
  Filled 2013-06-04 (×36): qty 1

## 2013-06-04 MED ORDER — PROCHLORPERAZINE EDISYLATE 5 MG/ML IJ SOLN
5.0000 mg | Freq: Four times a day (QID) | INTRAMUSCULAR | Status: DC | PRN
  Filled 2013-06-04: qty 2

## 2013-06-04 MED ORDER — POLYETHYLENE GLYCOL 3350 17 G PO PACK
17.0000 g | PACK | Freq: Every day | ORAL | Status: DC | PRN
  Administered 2013-06-04 – 2013-06-08 (×3): 17 g via ORAL
  Filled 2013-06-04 (×3): qty 1

## 2013-06-04 MED ORDER — ENOXAPARIN SODIUM 30 MG/0.3ML ~~LOC~~ SOLN
30.0000 mg | SUBCUTANEOUS | Status: DC
  Administered 2013-06-04 – 2013-06-12 (×9): 30 mg via SUBCUTANEOUS
  Filled 2013-06-04 (×11): qty 0.3

## 2013-06-04 MED ORDER — GLUCERNA 1.2 CAL PO LIQD
1000.0000 mL | ORAL | Status: DC
  Administered 2013-06-04: 1000 mL
  Filled 2013-06-04 (×4): qty 1000

## 2013-06-04 MED ORDER — INSULIN ASPART 100 UNIT/ML ~~LOC~~ SOLN
0.0000 [IU] | SUBCUTANEOUS | Status: DC
Start: 2013-06-04 — End: 2013-06-18
  Administered 2013-06-04 – 2013-06-05 (×4): 8 [IU] via SUBCUTANEOUS
  Administered 2013-06-05: 5 [IU] via SUBCUTANEOUS
  Administered 2013-06-05 (×2): 8 [IU] via SUBCUTANEOUS
  Administered 2013-06-05: 5 [IU] via SUBCUTANEOUS
  Administered 2013-06-06: 8 [IU] via SUBCUTANEOUS
  Administered 2013-06-06: 11 [IU] via SUBCUTANEOUS
  Administered 2013-06-06 (×4): 8 [IU] via SUBCUTANEOUS
  Administered 2013-06-07 (×2): 5 [IU] via SUBCUTANEOUS
  Administered 2013-06-07: 8 [IU] via SUBCUTANEOUS
  Administered 2013-06-07 (×2): 5 [IU] via SUBCUTANEOUS
  Administered 2013-06-07 – 2013-06-08 (×2): 8 [IU] via SUBCUTANEOUS
  Administered 2013-06-08: 5 [IU] via SUBCUTANEOUS
  Administered 2013-06-08 (×2): 8 [IU] via SUBCUTANEOUS
  Administered 2013-06-08: 5 [IU] via SUBCUTANEOUS
  Administered 2013-06-08: 11 [IU] via SUBCUTANEOUS
  Administered 2013-06-09: 15 [IU] via SUBCUTANEOUS
  Administered 2013-06-09 (×2): 8 [IU] via SUBCUTANEOUS
  Administered 2013-06-09: 11 [IU] via SUBCUTANEOUS
  Administered 2013-06-09 – 2013-06-10 (×4): 8 [IU] via SUBCUTANEOUS
  Administered 2013-06-10 (×2): 5 [IU] via SUBCUTANEOUS
  Administered 2013-06-10 (×2): 8 [IU] via SUBCUTANEOUS
  Administered 2013-06-11: 5 [IU] via SUBCUTANEOUS
  Administered 2013-06-11: 3 [IU] via SUBCUTANEOUS
  Administered 2013-06-11: 5 [IU] via SUBCUTANEOUS
  Administered 2013-06-11: 3 [IU] via SUBCUTANEOUS
  Administered 2013-06-11: 5 [IU] via SUBCUTANEOUS
  Administered 2013-06-11: 2 [IU] via SUBCUTANEOUS
  Administered 2013-06-12: 5 [IU] via SUBCUTANEOUS
  Administered 2013-06-12 (×3): 3 [IU] via SUBCUTANEOUS
  Administered 2013-06-12: 5 [IU] via SUBCUTANEOUS
  Administered 2013-06-12: 4 [IU] via SUBCUTANEOUS
  Administered 2013-06-13 (×2): 3 [IU] via SUBCUTANEOUS
  Administered 2013-06-13 (×2): 2 [IU] via SUBCUTANEOUS
  Administered 2013-06-13 – 2013-06-14 (×2): 3 [IU] via SUBCUTANEOUS
  Administered 2013-06-14 (×2): 2 [IU] via SUBCUTANEOUS
  Administered 2013-06-14: 3 [IU] via SUBCUTANEOUS
  Administered 2013-06-14: 2 [IU] via SUBCUTANEOUS
  Administered 2013-06-14: 3 [IU] via SUBCUTANEOUS
  Administered 2013-06-15 (×2): 2 [IU] via SUBCUTANEOUS
  Administered 2013-06-15: 3 [IU] via SUBCUTANEOUS
  Administered 2013-06-15 (×2): 2 [IU] via SUBCUTANEOUS
  Administered 2013-06-15: 3 [IU] via SUBCUTANEOUS
  Administered 2013-06-17 – 2013-06-18 (×2): 2 [IU] via SUBCUTANEOUS
  Administered 2013-06-18: 5 [IU] via SUBCUTANEOUS

## 2013-06-04 MED ORDER — NYSTATIN 100000 UNIT/ML MT SUSP
5.0000 mL | Freq: Four times a day (QID) | OROMUCOSAL | Status: DC
  Administered 2013-06-04 – 2013-07-03 (×113): 500000 [IU] via ORAL
  Filled 2013-06-04 (×125): qty 5

## 2013-06-04 MED ORDER — FLEET ENEMA 7-19 GM/118ML RE ENEM: 1.0000 | ENEMA | Freq: Once | RECTAL | Status: AC | PRN

## 2013-06-04 MED ORDER — DIPHENHYDRAMINE HCL 12.5 MG/5ML PO ELIX: 12.5000 mg | ORAL_SOLUTION | Freq: Four times a day (QID) | ORAL | Status: DC | PRN

## 2013-06-04 MED ORDER — FREE WATER
200.0000 mL | Freq: Three times a day (TID) | Status: DC
  Administered 2013-06-04 – 2013-06-26 (×81): 200 mL

## 2013-06-04 MED ORDER — INSULIN GLARGINE 100 UNIT/ML ~~LOC~~ SOLN
10.0000 [IU] | Freq: Two times a day (BID) | SUBCUTANEOUS | Status: DC
  Administered 2013-06-04 – 2013-06-06 (×5): 10 [IU] via SUBCUTANEOUS
  Filled 2013-06-04 (×8): qty 0.1

## 2013-06-04 MED ORDER — PROCHLORPERAZINE 25 MG RE SUPP
12.5000 mg | Freq: Four times a day (QID) | RECTAL | Status: DC | PRN
  Filled 2013-06-04: qty 1

## 2013-06-04 MED ORDER — ASPIRIN 300 MG RE SUPP
300.0000 mg | Freq: Every day | RECTAL | Status: DC
  Administered 2013-06-05: 300 mg via RECTAL
  Filled 2013-06-04 (×3): qty 1

## 2013-06-04 MED ORDER — PROCHLORPERAZINE MALEATE 5 MG PO TABS
5.0000 mg | ORAL_TABLET | Freq: Four times a day (QID) | ORAL | Status: DC | PRN
  Filled 2013-06-04: qty 2

## 2013-06-04 MED ORDER — BISACODYL 10 MG RE SUPP
10.0000 mg | Freq: Every day | RECTAL | Status: DC | PRN
  Administered 2013-06-07: 10 mg via RECTAL
  Filled 2013-06-04 (×2): qty 1

## 2013-06-04 NOTE — Progress Notes (Signed)
Rehab admissions - I spoke with pt and her son this am and they are doing well, hoping to come to inpatient rehab. I then received medical clearance from Dr. Tyrell Antonio. Bed is available today and will admit pt to inpatient rehab later today.  Updated RN and Hassan Rowan case Freight forwarder as well. Please call me with any questions. Thanks.  Nanetta Batty, PT Rehabilitation Admissions Coordinator [phone]

## 2013-06-04 NOTE — Progress Notes (Signed)
This nurse received report from [place], nurse on [address]. Patient on unit with increased vital signs. Manually taken, blood pressure 180/130 heart rate 96. Algis Liming, PA made aware. Rechecked at 1510, BP 180/110, heart rate 100. Baseline vital signs run high. Patient oriented to person, place, nonverbal. Complaints of slight dizziness. Will continue to monitor. Tonita Cong, RN

## 2013-06-04 NOTE — Progress Notes (Signed)
Patient is discharged from room 3W14 at this time and transferred to unit 4MW. Alert and in stable condition. Report given to nurse Dorian Pod, RN. IV sites d/c'd.

## 2013-06-04 NOTE — H&P (Signed)
Physical Medicine and Rehabilitation Admission H&P  Chief Complaint   Patient presents with   .  Right sided weakness, difficulty speaking, dysphagia.   HPI: [name] is a 53 y.o. female with history of HTN, DM, bipolar disorder, who was admitted on 05/27/13 with right facial droop and aphasia. 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 2 D echo with EF 55-60%. Carotid dopplers without significant ICA stenosis. 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 Patient with progressive neurologic worsening on 05/29/13 pm with RUE >RLE weakness. Started on hypertonic fluids and transferred to ICU for closer monitoring. Follow up CCT without significant changes. Patient with decrease in LOC with worsening of symptoms. EEG done showing frequent sharp activity in left hemisphere s/o focal nonspecific disturbance. Patient NPO due to tendency to hold po's and panda placed for nutritional support. Due to hypotension, TEE not recommended but patient may need loop recorder and/or TEE in the future. BP remains labile but lethargy resolving. Patient with resultant right hemiparesis, expressive aphasia with occasional phonation and needs multimodal cues to follow commands as well as dysphagia. Rehab team recommending CIR and patient admitted today.  Review of Systems  Unable to perform ROS: language   Past Medical History   Diagnosis  Date   .  Hypertension    .  Depression    .  Diabetes mellitus      ?type (05/29/2013)   .  Bipolar affective disorder    .  Schizophrenia      [name] 11/28/2007 (05/29/2013)   .  Stroke  05/27/2013     Past Surgical History   Procedure  Laterality  Date   .  Cesarean section   ~ 1987   .  Tubal ligation      History reviewed. No pertinent family history.  Social History: Per reports that she has never smoked. She has never used smokeless tobacco. Per reports that she does not drink alcohol or use illicit drugs.  Allergies: No Known Allergies  No prescriptions prior to admission    Home:  Home Living  Family/patient expects to be discharged to:: Private residence  Living Arrangements: Alone;Children  Available Help at Discharge: Family;Available PRN/intermittently  Type of Home: House  Home Access: Stairs to enter  [employer] of Steps: several  Entrance Stairs-Rails: Can reach both  Home Layout: One level  Home Equipment: None  Functional History:  Prior Function  Vocation: (employed)  Comments: Pt working PTA and driving.  Functional Status:  Mobility:      ADL:  ADL  Eating/Feeding: NPO  Grooming: Minimal assistance  Where Assessed - Grooming: Unsupported sitting  Upper Body Bathing: Moderate assistance  Where Assessed - Upper Body Bathing: Unsupported sitting  Lower Body Bathing: Maximal assistance  Where Assessed - Lower Body Bathing: Supported sit to stand  Upper Body Dressing: +1 Total assistance  Where Assessed - Upper Body Dressing: Unsupported sitting  Lower Body Dressing: +1 Total assistance  Where Assessed - Lower Body Dressing: Supported sit to stand  Toilet Transfer: +2 Total assistance;Moderate assistance  Toilet Transfer Method: Sit to stand;Stand pivot (going to pt's left)  Toilet Transfer Equipment: (bed>recliner)  Equipment  Used: Gait belt  Transfers/Ambulation Related to ADLs: see mobility section  ADL Comments: Pt needs multi-modal cues (verbal, gestural, tactile)  Cognition:  Cognition  Overall Cognitive Status: Impaired/Different from baseline  Arousal/Alertness: Awake/alert  Orientation Level: Oriented to person   Attention: Focused;Sustained  Focused Attention: Appears intact  Sustained Attention: Impaired  Sustained Attention Impairment: Verbal basic;Functional basic  Awareness: (right sided inattention vs visual field cut)  Comments: difficulty to formally assess due to severity of apraxia/aphasia. will continue to assess  Cognition  Arousal/Alertness: Awake/alert  Behavior During Therapy: WFL for tasks assessed/performed  Overall Cognitive Status: Impaired/Different from baseline  Area of Impairment: Attention;Following commands;Awareness;Problem solving  Current Attention Level: Focused  Memory: Decreased short-term memory  Following Commands: Follows one step commands inconsistently;Follows one step commands with increased time  Safety/Judgement: Decreased awareness of safety;Decreased awareness of deficits  Awareness: Intellectual  Problem Solving: Difficulty sequencing;Decreased initiation;Slow processing;Requires verbal cues;Requires tactile cues  General Comments: Pt seems to lose focus quickl to task at hand. Needs multimodal cues to complete command following ~50% of time. Cues to attend to task, cues to attend to right side and for safety as pt is quick to move.  Difficult to assess due to: Impaired communication (expressive difficulty)  Physical Exam:  Blood pressure 170/115, pulse 117, temperature 99.4 F (37.4 C), temperature source Oral, resp. rate 18, height 5' 6" (1.676 m), weight 73.483 kg (162 lb), SpO2 96.00%.  Physical Exam  Constitutional: She appears well-nourished. Alert sitting in bed HENT: NGT inserted Head: Atraumatic.  Eyes: EOM are normal.  Neck: No JVD present. No tracheal deviation present. No thyromegaly present.  Cardiovascular: Normal rate. Normal rhythm. No murmurs, rubs, or gallops Respiratory: No respiratory distress. No wheezes, rales, or rhonchi GI: She exhibits no distension. BS+, non-tender Neurological:  Alert, makes eye contact. Right facial droop.  Can open mouth with tactile cueing. Does not protrude tongue. Blinks with startle. Senses pain right arm and leg. No initiation of movement throughout the right arm and leg. No resting tone. LUE and LLE are grossly 3-4/5 but slow to initiate. Non-verbal. Appears to have global aphasia. Psych: affect flat   Results for orders placed during the hospital encounter of 05/27/13 (from the past 48 hour(s))   GLUCOSE, CAPILLARY Status: Abnormal    Collection Time    06/02/13 12:00 PM   Result  Value  Ref Range    Glucose-Capillary  227 (*)  70 - 99 mg/dL    Comment 1  Documented in Chart    GLUCOSE, CAPILLARY Status: Abnormal    Collection Time    06/02/13 4:15 PM   Result  Value  Ref Range    Glucose-Capillary  295 (*)  70 - 99 mg/dL    Comment 1  Notify RN    GLUCOSE, CAPILLARY Status: Abnormal    Collection Time    06/02/13 8:07 PM   Result  Value  Ref Range    Glucose-Capillary  239 (*)  70 - 99 mg/dL   GLUCOSE, CAPILLARY Status: Abnormal    Collection Time    06/02/13 11:48 PM   Result  Value  Ref Range    Glucose-Capillary  255 (*)  70 - 99 mg/dL   GLUCOSE, CAPILLARY Status: Abnormal    Collection Time    06/03/13 3:40 AM   Result  Value  Ref Range    Glucose-Capillary  234 (*)  70 - 99 mg/dL   BASIC METABOLIC PANEL Status: Abnormal    Collection Time  06/03/13 5:10 AM   Result  Value  Ref Range    Sodium  141  137 - 147 mEq/L    Potassium  4.0  3.7 - 5.3 mEq/L    Chloride  99  96 - 112 mEq/L    CO2  26  19 - 32 mEq/L    Glucose, Bld  282 (*)  70 - 99 mg/dL    BUN  16  6 - 23 mg/dL    Creatinine, Ser  0.68  0.50 - 1.10 mg/dL    Calcium  9.7  8.4 - 10.5 mg/dL    GFR calc non Af Amer  >90  >90 mL/min    GFR calc Af Amer  >90  >90 mL/min    Comment:  (NOTE)     The eGFR has been calculated using the CKD EPI equation.     This calculation has not been validated in all clinical situations.     eGFR's persistently <90 mL/min signify possible Chronic Kidney     Disease.    PHOSPHORUS Status: None    Collection Time    06/03/13 5:10 AM   Result  Value  Ref Range    Phosphorus  4.4  2.3 - 4.6 mg/dL   MAGNESIUM Status: None    Collection Time    06/03/13 5:10 AM   Result  Value  Ref Range    Magnesium  2.0  1.5 - 2.5 mg/dL   CBC WITH DIFFERENTIAL Status: None    Collection Time    06/03/13 5:10 AM   Result  Value  Ref Range    WBC  9.3  4.0 - 10.5 K/uL    RBC  4.71  3.87 - 5.11 MIL/uL    Hemoglobin  14.5  12.0 - 15.0 g/dL    HCT  41.1  36.0 - 46.0 %    MCV  87.3  78.0 - 100.0 fL    MCH  30.8  26.0 - 34.0 pg    MCHC  35.3  30.0 - 36.0 g/dL    RDW  11.9  11.5 - 15.5 %    Platelets  247  150 - 400 K/uL    Neutrophils Relative %  64  43 - 77 %    Neutro Abs  6.0  1.7 - 7.7 K/uL    Lymphocytes Relative  25  12 - 46 %    Lymphs Abs  2.3  0.7 - 4.0 K/uL    Monocytes Relative  9  3 - 12 %    Monocytes Absolute  0.9  0.1 - 1.0 K/uL    Eosinophils Relative  2  0 - 5 %    Eosinophils Absolute  0.2  0.0 - 0.7 K/uL    Basophils Relative  0  0 - 1 %    Basophils Absolute  0.0  0.0 - 0.1 K/uL   GLUCOSE, CAPILLARY Status: Abnormal    Collection Time    06/03/13 8:39 AM   Result  Value  Ref Range    Glucose-Capillary  276 (*)  70 - 99 mg/dL    Comment 1  Documented in Chart     Comment 2  Notify RN    GLUCOSE, CAPILLARY Status: Abnormal    Collection Time    06/03/13 11:48 AM   Result  Value  Ref Range    Glucose-Capillary  245 (*)  70 - 99 mg/dL    Comment 1  Documented in Chart  Comment 2  Notify RN    GLUCOSE, CAPILLARY Status: Abnormal    Collection Time    06/03/13 4:35 PM   Result  Value  Ref Range    Glucose-Capillary  226 (*)  70 - 99 mg/dL    Comment 1  Notify RN    GLUCOSE, CAPILLARY Status: Abnormal    Collection Time    06/03/13 8:19 PM   Result  Value  Ref Range    Glucose-Capillary  280 (*)  70 - 99 mg/dL   GLUCOSE, CAPILLARY Status: Abnormal    Collection Time    06/04/13 12:32 AM   Result  Value  Ref Range     Glucose-Capillary  230 (*)  70 - 99 mg/dL   BASIC METABOLIC PANEL Status: Abnormal    Collection Time    06/04/13 3:30 AM   Result  Value  Ref Range    Sodium  141  137 - 147 mEq/L    Potassium  4.0  3.7 - 5.3 mEq/L    Chloride  99  96 - 112 mEq/L    CO2  27  19 - 32 mEq/L    Glucose, Bld  250 (*)  70 - 99 mg/dL    BUN  15  6 - 23 mg/dL    Creatinine, Ser  0.69  0.50 - 1.10 mg/dL    Calcium  9.8  8.4 - 10.5 mg/dL    GFR calc non Af Amer  >90  >90 mL/min    GFR calc Af Amer  >90  >90 mL/min    Comment:  (NOTE)     The eGFR has been calculated using the CKD EPI equation.     This calculation has not been validated in all clinical situations.     eGFR's persistently <90 mL/min signify possible Chronic Kidney     Disease.   PHOSPHORUS Status: None    Collection Time    06/04/13 3:30 AM   Result  Value  Ref Range    Phosphorus  4.5  2.3 - 4.6 mg/dL   MAGNESIUM Status: None    Collection Time    06/04/13 3:30 AM   Result  Value  Ref Range    Magnesium  2.0  1.5 - 2.5 mg/dL   CBC WITH DIFFERENTIAL Status: Abnormal    Collection Time    06/04/13 3:30 AM   Result  Value  Ref Range    WBC  10.0  4.0 - 10.5 K/uL    RBC  4.65  3.87 - 5.11 MIL/uL    Hemoglobin  14.4  12.0 - 15.0 g/dL    HCT  41.1  36.0 - 46.0 %    MCV  88.4  78.0 - 100.0 fL    MCH  31.0  26.0 - 34.0 pg    MCHC  35.0  30.0 - 36.0 g/dL    RDW  12.1  11.5 - 15.5 %    Platelets  279  150 - 400 K/uL    Neutrophils Relative %  63  43 - 77 %    Neutro Abs  6.3  1.7 - 7.7 K/uL    Lymphocytes Relative  25  12 - 46 %    Lymphs Abs  2.5  0.7 - 4.0 K/uL    Monocytes Relative  11  3 - 12 %    Monocytes Absolute  1.1 (*)  0.1 - 1.0 K/uL    Eosinophils Relative  2  0 - 5 %  Eosinophils Absolute  0.2  0.0 - 0.7 K/uL    Basophils Relative  0  0 - 1 %    Basophils Absolute  0.0  0.0 - 0.1 K/uL   GLUCOSE, CAPILLARY Status: Abnormal    Collection Time    06/04/13 4:43 AM   Result  Value  Ref Range    Glucose-Capillary  259  (*)  70 - 99 mg/dL   GLUCOSE, CAPILLARY Status: Abnormal    Collection Time    06/04/13 7:56 AM   Result  Value  Ref Range    Glucose-Capillary  275 (*)  70 - 99 mg/dL    Dg Abd Portable 1v  06/03/2013 CLINICAL DATA: NG tube placement. EXAM: PORTABLE ABDOMEN - 1 VIEW COMPARISON: DG ABD 1 VIEW dated 05/30/2013 FINDINGS: NG tube coiled in stomach. Gas pattern is nonspecific. Calcific densities right upper quadrant consistent with gallstones. Confirmation with ultrasound can be performed. IMPRESSION: 1. Calcific densities right upper quadrant consistent with gallstones . 2. NG tube coiled in stomach. Electronically Signed By: Marcello Moores Register On: 06/03/2013 20:34   Post Admission Physician Evaluation:  1. Functional deficits secondary to embolic infarcts, ?PRES--with dense Right HP and aphasia. 2. Patient is admitted to receive collaborative, interdisciplinary care between the physiatrist, rehab nursing staff, and therapy team. 3. Patient's level of medical complexity and substantial therapy needs in context of that medical necessity cannot be provided at a lesser intensity of care such as a SNF. 4. Patient has experienced substantial functional loss from his/her baseline which was documented above under the "Functional History" and "Functional Status" headings. Judging by the patient's diagnosis, physical exam, and functional history, the patient has potential for functional progress which will result in measurable gains while on inpatient rehab. These gains will be of substantial and practical use upon discharge in facilitating mobility and self-care at the household level. 5. Physiatrist will provide 24 hour management of medical needs as well as oversight of the therapy plan/treatment and provide guidance as appropriate regarding the interaction of the two. 6. 24 hour rehab nursing will assist with bladder management, bowel management, safety, skin/wound care, disease management, medication  administration, pain management and patient education and help integrate therapy concepts, techniques,education, etc. 7. PT will assess and treat for/with: Lower extremity strength, range of motion, stamina, balance, functional mobility, safety, adaptive techniques and equipment, NMR, cognitive perceptual and visual perceptual rx, education. Goals are: supervision to min assist. 8. OT will assess and treat for/with: ADL's, functional mobility, safety, upper extremity strength, adaptive techniques and equipment, NMR, cognitive perceptual and visual perceptual rx, education. Goals are: supervision to min assist. 9. SLP will assess and treat for/with: communication, language, swallowing, cognition. Goals are: supervision to mod assist. 10. Case Management and Social Worker will assess and treat for psychological issues and discharge planning. 11. Team conference will be held weekly to assess progress toward goals and to determine barriers to discharge. 12. Patient will receive at least 3 hours of therapy per day at least 5 days per week. 13. ELOS: 16-24 days  14. Prognosis: good   Medical Problem List and Plan:  Embolic cva, ?PRES  1. DVT Prophylaxis/Anticoagulation: Pharmaceutical: Lovenox  2. Pain Management: N/A  3. Bipolar disorder/Mood: No signs of distress reported but patient with poor awareness of deficits. Monitor mood for now. LCSW to follow with patient and family.  4. Neuropsych: This patient is not capable of making decisions on her own behalf.  5. DM type 2: Will monitor with bid checks.  Will increase lantus to bid--anticipate higher BS with tube feeds. Will titrate as needed. Use SSI for elevated BS.  6. HTN: Will monitor with bid checks. Avoid hypotension with permissive high BP 180-200 range to prevent hypoperfusion/neuro worsening  7. H/o paranoid schizophrenia: Has been to [facility] in the past due to decompensation. Not on medications as this time--has been on respirdal in the past and  has history of non-compliance per records  8. Dysphagia: NPO with panda TF. Likely will need G tube given presentation. 9. Resting Tachycardia: Check baseline EKG--HR 110-120 range at rest. Check HR with activity level. Will likely require low dose BB for heart rate control if HR increases further with activity. Add water flushes to avoid dehydration.   Meredith Staggers, MD, [place] [facility]   06/04/2013

## 2013-06-04 NOTE — Progress Notes (Signed)
Physical Therapy Treatment Patient Details Name: [name] MRN: [id number] DOB: 1960-11-08 Today's Date: 06/04/2013 Time: 3785-8850 PT Time Calculation (min): 52 min  PT Assessment / Plan / Recommendation  History of Present Illness 53 y.o. female presenting with difficulty speaking. Per MRI: Multiple foci of acute ischemia within the left middle cerebral artery territory. In addition, however punctate focus of acute ischemia within the left occipital lobe and right thalamus (both of which would be posterior circulation); also PRES    PT Comments   Pt is progressing very well with her mobility today.  She continues to improve bed mobility and sitting balance and was able to walk a short distance with mod assist and the hallway railing.  Son present and assisting with session.  Pt continues to be an excellent inpatient rehab candidate.    Follow Up Recommendations  CIR     Does the patient have the potential to tolerate intense rehabilitation    Yes  Barriers to Discharge   None      Equipment Recommendations  Wheelchair (measurements PT);Wheelchair cushion (measurements PT);Hospital bed;Other (comment) (hemi walker vs R PFRW)    Recommendations for Other Services   None  Frequency Min 4X/week   Progress towards PT Goals Progress towards PT goals: Progressing toward goals  Plan Current plan remains appropriate    Precautions / Restrictions Precautions Precautions: Fall Precaution Comments: right hemiperesis   Pertinent Vitals/Pain See vitals flow sheet.    Mobility  Bed Mobility Overal bed mobility: Needs Assistance Bed Mobility: Rolling;Sidelying to Sit Rolling: Min assist (to right side with rail) Sidelying to sit: Min assist (HOB flat, with rail) General bed mobility comments: Min assist to hlep pt find right rail with left hand.  Cues to push right leg off of bed with left leg (min tactile cues to help pt complete this), and then min assist to support trunk during  transition from side lying to sitting.   Transfers Overall transfer level: Needs assistance Equipment used: None Transfers: Sit to/from [facility] Sit to Stand: Min assist;Mod assist Stand pivot transfers: Mod assist General transfer comment: stood from bed, recliner, and BSC.  Higher bed min assist, lower recliner mod assist.  We also transferred to both right and left sides both requiring mod assist to support trunk, block right knee and support trunk for balance.  Assist needed to progress/move right leg when transferring to the right side.   Ambulation/Gait Ambulation/Gait assistance: +2 safety/equipment;Mod assist Ambulation Distance (Feet): 9 Feet Assistive device:  (hallway railing on her left. ) Gait Pattern/deviations: Decreased stance time - right;Decreased dorsiflexion - right;Decreased weight shift to right;Step-to pattern Gait velocity: decreased Gait velocity interpretation: Below normal speed for age/gender General Gait Details: Pt using the hallway railing for support.  Pt's son following closely with recliner chair.  Therapist supporting trunk and blocking right knee in stance.  Assist needed to weight shift over right leg and porgress right leg to take forward steps.  Pt doing well following one step commands and waiting until therapist is ready to take more steps on the left.   Modified Rankin (Stroke Patients Only) Pre-Morbid Rankin Score: No symptoms Modified Rankin: Severe disability      PT Goals (current goals can now be found in the care plan section) Acute Rehab PT Goals Patient Stated Goal: pt unable to state (aphasia)  Visit Information  Last PT Received On: 06/04/13 Assistance Needed: +2 (safer with gait) History of Present Illness: 53 y.o. female presenting with  difficulty speaking. Per MRI: Multiple foci of acute ischemia within the left middle cerebral artery territory. In addition, however punctate focus of acute ischemia within the left  occipital lobe and right thalamus (both of which would be posterior circulation); also PRES     Subjective Data  Subjective: PT answering yes/no questions, but inconsistantly accurate.  Son present to confirm accuracy of questions.   Patient Stated Goal: pt unable to state (aphasia)   Cognition  Cognition Arousal/Alertness: Awake/alert Behavior During Therapy: WFL for tasks assessed/performed Overall Cognitive Status: Impaired/Different from baseline Area of Impairment: Attention;Memory;Following commands;Safety/judgement;Awareness;Problem solving Current Attention Level: Focused Memory: Decreased short-term memory Following Commands: Follows one step commands inconsistently;Follows one step commands with increased time Safety/Judgement: Decreased awareness of safety Awareness: Intellectual Problem Solving: Decreased initiation;Difficulty sequencing;Requires verbal cues;Requires tactile cues General Comments: Asking pt yes/no questions about her family.  Son reporting nods were not accurate.   Difficult to assess due to: Impaired communication (expressive difficulty)    Balance  Balance Overall balance assessment: Needs assistance Sitting-balance support: Single extremity supported Sitting balance-Leahy Scale: Poor Sitting balance - Comments: pt can support herself more EOB today with one extremitiy supported.  In static sitting she can be supervision, but still needs min to mod assist when preforming dynamic activities like reaching and scooting to the EOB.   Postural control: Right lateral lean Standing balance support: Single extremity supported Standing balance-Leahy Scale: Poor Standing balance comment: mod asssit for static and dynamic mobility.  General Comments General comments (skin integrity, edema, etc.): Pt able to indicate to me that she needs to go to the bathroom (although she had already urinated in the bed), and was able to urinate on the [facility].  Two person asssist  needed for peri care: one to support the pt and one to preform peri care.    End of Session PT - End of Session Equipment Utilized During Treatment: Gait belt Activity Tolerance: Patient tolerated treatment well Patient left: in chair;with call bell/phone within reach;with family/visitor present     Wells Guiles B. [place], [place], DPT [phone]   06/04/2013, 12:17 PM

## 2013-06-04 NOTE — Care Management Note (Signed)
    Page 1 of 1   06/04/2013     12:06:00 PM   CARE MANAGEMENT NOTE 06/04/2013  Patient:  [name],[name]   Account Number:  [id number]  Date Initiated:  05/28/2013  Documentation initiated by:  [facility]  Subjective/Objective Assessment:   Admitted with aphasia and facial droop     Action/Plan:   Anticipated DC Date:  06/02/2013   Anticipated DC Plan:  [place]  CM consult      Choice offered to / List presented to:             Status of service:  Completed, signed off Medicare Important Message given?   (If response is "NO", the following Medicare IM given date fields will be blank) Date Medicare IM given:   Date Additional Medicare IM given:    Discharge Disposition:  IP REHAB FACILITY  Per UR Regulation:  Reviewed for med. necessity/level of care/duration of stay  If discussed at [place] of Stay Meetings, dates discussed:   06/03/2013    Comments:  Contact:  Oliphant,[name] Daughter     [phone]                 Gibson,Evelyn Sister [phone]  06-02-13 1:40pm Luz Lex, RNBSN [phone] More awake today - PT/OT recommending CIR - CIR saw earlier - touched base with CIR laison to make sure on radar next day or two.  Moved to tele unit today.

## 2013-06-04 NOTE — Progress Notes (Signed)
  Note that Lantus increased today to 13 units daily.  Please consider adding Novolog tube feed coverage 3 units q 4 hours to cover CHO in tube feeds.   Thank you, Rosita Kea, RN, CNS, Diabetes Coordinator [phone])

## 2013-06-04 NOTE — Discharge Summary (Addendum)
Physician Discharge Summary  SHARRION GALYAN [id number] DOB: Feb 10, 1961 DOA: 05/27/2013  PCP: No PCP Per Patient  Admit date: 05/27/2013 Discharge date: 06/04/2013  Time spent: 35 minutes  Recommendations for Outpatient Follow-up:  1. Needs to follow up with Dr Leonie Man, after discharge,. 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   Diet recommendation: Enteral nutrition.   Filed Weights   06/02/13 0500 06/03/13 0415 06/04/13 0500  Weight: 72.3 kg (159 lb 6.3 oz) 72.53 kg (159 lb 14.4 oz) 73.483 kg (162 lb)    History of present illness:  This is a 53 year old female w/ known h/o HTN, presented to the ER 3/10 w/ difficulty speaking. Happened at some point during day 3/10 (but unknown exact time). Last seen nml state: 0830 3/10. Initial CT negative. BP 205/121. Seen by Neurology. PCCM asked to admit to NICU for stroke eval.    Hospital Course:  53 year old female patient with known history of hypertension. Initially presented to the emergency department on 05/27/2013 with right-sided facial droop and aphasia. Time of onset unknown when she presented. Apparently last seen normal around 8:30 AM that morning. Initial CT was negative and her presenting blood pressure was markedly elevated at 205/121. She was evaluated by neurology who asked the critical care team to admit to the neuro ICU for stroke evaluation.  After admission subsequent MRI MRA of the brain revealed multiple foci of acute ischemia within the left middle cerebral artery territory as well as left occipital lobe and right thalamic regions. She was also found to have a left M1 occlusion  without collateralization. Echocardiogram was unremarkable for possible embolic source but given presentation TEE was recommended by neurology.  She was transitioned out with stepdown orders and team 1 was to assume care of the patient on 05/30/2013 but on that same morning she became unresponsive while sitting upright in the chair with her blood pressure decreasing to 135 after a dose of labetalol. At that point her right side became flaccid but the symptoms improved with subsequent increase in blood pressure to a hypertensive range. CT demonstrated a stenotic M1 lesion in the left MCA but no new findings. EEG showed left focal abnormality. The patient was kept in the ICU.  Initially patient's recurrent episodes of lethargy and neuro changes were presumed related to PRES but now are attributed to relative hypotension in the setting of a patient with chronic severe malignant hypertension and it was felt allowing significantly high permissive hypertension would be appropriate for this patient. She was eventually appropriate to transfer to the neuro telemetry floor.   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  - Patient was not consider TPA candidate due to delayed arrival, has persistent right-sided deficits  - Infarcts were felt to be embolic secondary to unknown source. Patient was not on any antibiotics prior to admission. -PT/OT evaluation recommended inpatient rehabilitation  -LP, on arrival and Hypercoagulable workup unrevealing.  - EEG showed slowing over  the left hemisphere with frequent left hemispheric sharp activity. No need for anticonvulsant at this time. Discussed with neurology.  - LDL 130, was not on any statin PTH, now on Zocor 20 mg daily, goal  less than 70  -Discussed in detail with stroke service, Dr. Leonie Man, do not recommend TEE during this hospitalization due to hypotension. Will consider TEE in the future, likely after discharge/rehab, may consider loop.  Aphasia complicating stroke  -Continue speech therapy   Hypertensive emergency/Malignant hypertension  -Per neurology, maintain elevated BP to prevent neuro worsening. Recommendation is to keep systolic blood pressure between 180 and 200 noting patient seems to be dependent on this pressure has neurological worsening when blood pressure is close to normal range  -Currently not on any antihypertensive medications   DM (diabetes mellitus) type II controlled, neurological manifestation  -Uncontrolled with hemoglobin A1c 14.6  -CBGs have increased with the addition of tube feeding so we'll adjust Lantus and sliding scale accordingly  -Tube feedings changed to Glucerna from Vital AF   Dysphagia  -Continue tube feedings through temporary tube  -Hopefully over time swallowing will improve to allow removal of feeding tube   Benign polycythemia  DVT Prophylaxis:Heparin subcutaneous   Procedures: EEG : This is an abnormal EEG secondary to slowing over the left  hmeisphere with frequent left hemispheric sharp acitivty as  well.. This finding is suggestive of a focal disturbance that is  etiologically nonspecific, but may include a mass lesion or  post-ictal focal slowing among other possibilities.       Consultations: Nephrology/stroke service inpatient rehabilitation   Discharge Exam: Filed Vitals:   06/04/13 0826  BP: 170/115  Pulse: 117  Temp: 99.4 F (37.4 C)  Resp: 18    General: No distress. NG ube in place.  Cardiovascular: S 1, S 2 RRR Respiratory: CTA  Discharge Instructions     Medication List    Notice   You have not been prescribed any medications.     No Known Allergies     Follow-up Information   Follow up with Default, Provider, MD.       Follow up with Forbes Cellar, MD. Schedule an appointment as soon as possible for a visit in 2 months. (Stroke Clinic)    Specialties:  Neurology, Radiology   Contact information:   [address] [place] [address] [phone]        The results of significant diagnostics from this hospitalization (including imaging, microbiology, ancillary and laboratory) are listed below for reference.    Significant Diagnostic Studies: Ct Angio Head W/cm &/or Wo Cm  05/30/2013   CLINICAL DATA:  Stroke.  EXAM: CT ANGIOGRAPHY HEAD  TECHNIQUE: Multidetector CT imaging of the head was performed using the standard protocol during bolus administration of intravenous contrast. Multiplanar CT image reconstructions and MIPs were obtained to evaluate the vascular anatomy.  CONTRAST:  59mL OMNIPAQUE IOHEXOL 350 MG/ML SOLN  COMPARISON:  Head CT 05/29/2013.  MRI 05/27/2013.  FINDINGS: Head CT images show areas of low-density within the left frontoparietal region, basal ganglia and insular region consistent with recent infarctions. There is low density in the pons that appears more prominent than was seen previously and there could be recent brainstem insult. No hemorrhage or herniation.  Both internal carotid arteries are patent through the skullbase. There is atherosclerotic disease in the carotid siphon regions but no flow-limiting stenosis. Both supra clinoid internal carotid arteries show moderate narrowing, 30-50%.  On the right, the anterior and middle  cerebral arteries are patent without proximal stenosis. Distal branch vessels appear normally opacified. On the left, there is normal appearing flow within the left anterior cerebral artery territory. There is severe stenosis in the M1 segment on the left with markedly diminished opacification of the more distal left MCA branch vessels.  Both vertebral arteries are patent to the basilar. No basilar stenosis. Posterior circulation branch vessels  are patent. There is atherosclerotic irregularity of the posterior cerebral arteries and superior cerebellar arteries.  Review of the MIP images confirms the above findings.  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 No other intracranial flow-limiting stenosis or embolic disease evident.  Areas of low density in the left MCA territory consistent with infarction. No hemorrhagic transformation. Question new low density in the pons, not definite.   Electronically Signed   By: Nelson Chimes M.D.   On: 05/30/2013 12:19   Dg Chest 2 View  05/27/2013   CLINICAL DATA Stroke symptoms, difficulty with speech.  EXAM CHEST  2 VIEW  COMPARISON 11/15/2006  FINDINGS Mild aortic tortuosity. Normal heart size. No confluent airspace opacity. No pleural effusion or pneumothorax. Mild multilevel degenerative change without acute osseous finding.  IMPRESSION No radiographic evidence of an acute cardiopulmonary process.  SIGNATURE  Electronically Signed   By: Carlos Levering M.D.   On: 05/27/2013 22:33   Dg Abd 1 View  05/30/2013   CLINICAL DATA:  NG tube placement  EXAM: ABDOMEN - 1 VIEW  COMPARISON:  None.  FINDINGS: The bowel gas pattern is normal. Residual contrast is identified in the renal calices. A nasogastric tube is identified coiled in the stomach with distal tip in the mid esophagus. Repositioning is recommended  IMPRESSION: A nasogastric tube is identified coiled in the stomach with distal tip in the mid esophagus. Repositioning is recommended   Electronically Signed   By: Abelardo Diesel M.D.   On: 05/30/2013 13:24   Ct Head Wo Contrast  05/29/2013   CLINICAL DATA:  Worsening headache.  EXAM: CT HEAD WITHOUT CONTRAST  TECHNIQUE: Contiguous axial images were obtained from the base of the skull through the vertex without intravenous contrast.  COMPARISON:  CT scan of May 29, 2013.  FINDINGS: Bony  calvarium appears intact. No midline shift is noted. Ventricular size is within normal limits. 15 x 12 mm rounded low density is noted in left basal ganglia consistent with infarction. 22 x 20 mm rounded low density is noted in left periventricular white matter consistent with infarction. Stable left frontal lobe density is noted as well. These are unchanged compared to prior exam. There is no evidence of hemorrhage or mass lesion.  IMPRESSION: Stable left-sided infarctions are noted. No significant changes noted compared to prior exam.   Electronically Signed   By: Sabino Dick M.D.   On: 05/29/2013 20:22   Ct Head Wo Contrast  05/29/2013   CLINICAL DATA:  Right-sided weakness  EXAM: CT HEAD WITHOUT CONTRAST  TECHNIQUE: Contiguous axial images were obtained from the base of the skull through the vertex without intravenous contrast.  COMPARISON:  MR HEAD W/O CM dated 05/27/2013; MR MRA HEAD W/O CM dated 05/27/2013; CT HEAD W/O CM dated 05/27/2013; CT HEAD W/O CM dated 10/04/2009  FINDINGS: There is no evidence of mass effect, midline shift or extra-axial fluid collections. There is no evidence of a space-occupying lesion or intracranial hemorrhage. There are areas of low attenuation  in the left frontal lobe, left basal ganglia, left insula and small foci in the left parietal lobe consistent with multiple infarcts.  The ventricles and sulci are appropriate for the patient's age. The basal cisterns are patent.  Visualized portions of the orbits are unremarkable. The visualized portions of the paranasal sinuses and mastoid air cells are unremarkable.  The osseous structures are unremarkable.  IMPRESSION: Involving multifocal left MCA territory nonhemorrhagic infarcts. The area of low-attenuation the left basal ganglia and left insula are larger compared with prior exams.   Electronically Signed   By: Kathreen Devoid   On: 05/29/2013 13:57   Ct Head (brain) Wo Contrast  05/27/2013   CLINICAL DATA Slurred speech, right  facial droop  EXAM CT HEAD WITHOUT CONTRAST  TECHNIQUE Contiguous axial images were obtained from the base of the skull through the vertex without intravenous contrast.  COMPARISON CT HEAD W/O CM dated 10/04/2009; CT HEAD W/O CM dated 02/02/2007; CT HEAD W/O CM dated 11/15/2006  FINDINGS There is no evidence of mass effect, midline shift, or extra-axial fluid collections. There is no evidence of a space-occupying lesion or intracranial hemorrhage. There is no evidence of a cortical-based area of acute infarction. There is periventricular white matter low attenuation likely secondary to microangiopathy.  The ventricles and sulci are appropriate for the patient's age. The basal cisterns are patent.  Visualized portions of the orbits are unremarkable. The visualized portions of the paranasal sinuses and mastoid air cells are unremarkable.  The osseous structures are unremarkable.  IMPRESSION No acute intracranial pathology.  SIGNATURE  Electronically Signed   By: Kathreen Devoid   On: 05/27/2013 18:19   Mr Angiogram Head Wo Contrast  05/27/2013   CLINICAL DATA Right facial droop, slurred speech.  EXAM MRI HEAD WITHOUT CONTRAST  MRA HEAD WITHOUT CONTRAST  TECHNIQUE Multiplanar, multiecho pulse sequences of the brain and surrounding structures were obtained without intravenous contrast. Angiographic images of the head were obtained using MRA technique without contrast.  COMPARISON CT HEAD W/O CM dated 05/27/2013; CT HEAD W/O CM dated 10/04/2009  FINDINGS MRI HEAD FINDINGS  Multiple foci of reduced diffusion in the left frontal temporal lobe, including 16 x 14 mm lesion and left corona radiata, 20 x 10 mm lesion in the high left frontal lobe at the gray-white matter junction. In addition, punctate focus of reduced diffusion within the right medial thalamus, and left occipital lobe. All foci show low ADC values in very mild FLAIR hyperintense signal, all appearing of similar age. No susceptibility artifact to suggest  hemorrhage.  The ventricles and sulci are overall normal for patient's age. Scattered patchy supratentorial white matter T2 hyperintensities are far greater than expected for age, many which appear to radiate from the periventricular margin. In addition, symmetric homogenous bright T2 signal within the bilateral lenticulostriate nuclei, axial 13 /22. Patchy bright signal within the pons. However there is confluent periventricular white matter FLAIR T2 hyperintensity about the temporal horns and occipital horns. No midline shift.  No abnormal extra-axial fluid collections. Ocular globes and orbital contents are nonsuspicious though not tailored for evaluation. No abnormal sellar expansion or cerebellar tonsillar ectopia. The visualized paranasal sinuses and mastoid air cells are appear well aerated.  MRA HEAD FINDINGS  Anterior circulation: Normal flow related enhancement of the included cervical, petrous, cavernous and supra clinoid internal carotid arteries. Patent anterior communicating artery. Normal flow related enhancement of the anterior cerebral arteries, including more distal segments. Abrupt cut off of the left M1 segment, with  absent distal left middle cerebral artery vessels. Patent right middle cerebral artery and distal segments, however there is somewhat distal segment luminal irregularity.  Posterior circulation: Left Vertebral artery is dominant. Basilar artery is patent, with normal flow related enhancement of the main branch vessels. Normal flow related enhancement of the posterior cerebral arteries centrally, however there is relative paucity of right P3 branches compared to the left. Mildly irregular P2 segments, to lesser extent left P3 segment.  No hemodynamically significant stenosis, aneurysm within the anterior nor posterior circulation.  IMPRESSION MRI head: Multiple foci of acute ischemia within the left middle cerebral artery territory. In addition, however punctate focus of acute  ischemia within the left occipital lobe and right thalamus (both of which would be posterior circulation). All areas of ischemia have similar acuity. No hemorrhagic conversion.  Symmetric abnormal bright signal within the lenticulostriate nuclei (basal ganglia) in addition to abnormal temporoccipital periventricular T2 hyperintense signal. These findings may reflect sequelae of acute severe hypertension (PRES) but, it can also be seen with osmotic demyelination, autoimmune disorders such as scleroderma (which can have associated vasculopathy), less likely infectious encephalitis.  Moderate to severe white matter changes, in a distribution which can be seen with demyelination, with superimposed possible chronic small vessel ischemic disease though, are nonspecific.  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  Mild luminal irregularity of the intracranial vessels which can be seen with intracranial atherosclerosis though, may also be seen with vasculopathy.  Critical Value/emergent results were called by telephone at the time of interpretation on 05/27/2013 at 10:28 PM to Dr. Calvert Cantor , who verbally acknowledged these results.  SIGNATURE  Electronically Signed   By: Elon Alas   On: 05/27/2013 22:32   [name] Contrast  05/27/2013   CLINICAL DATA Right facial droop, slurred speech.  EXAM MRI HEAD WITHOUT CONTRAST  MRA HEAD WITHOUT CONTRAST  TECHNIQUE Multiplanar, multiecho pulse sequences of the brain and surrounding structures were obtained without intravenous contrast. Angiographic images of the head were obtained using MRA technique without contrast.  COMPARISON CT HEAD W/O CM dated 05/27/2013; CT HEAD W/O CM dated 10/04/2009  FINDINGS MRI HEAD FINDINGS  Multiple foci of reduced diffusion in the left frontal temporal lobe, including 16 x 14 mm lesion and left corona radiata, 20 x 10 mm lesion in the high left frontal lobe at the gray-white matter junction. In addition, punctate focus  of reduced diffusion within the right medial thalamus, and left occipital lobe. All foci show low ADC values in very mild FLAIR hyperintense signal, all appearing of similar age. No susceptibility artifact to suggest hemorrhage.  The ventricles and sulci are overall normal for patient's age. Scattered patchy supratentorial white matter T2 hyperintensities are far greater than expected for age, many which appear to radiate from the periventricular margin. In addition, symmetric homogenous bright T2 signal within the bilateral lenticulostriate nuclei, axial 13 /22. Patchy bright signal within the pons. However there is confluent periventricular white matter FLAIR T2 hyperintensity about the temporal horns and occipital horns. No midline shift.  No abnormal extra-axial fluid collections. Ocular globes and orbital contents are nonsuspicious though not tailored for evaluation. No abnormal sellar expansion or cerebellar tonsillar ectopia. The visualized paranasal sinuses and mastoid air cells are appear well aerated.  MRA HEAD FINDINGS  Anterior circulation: Normal flow related enhancement of the included cervical, petrous, cavernous and supra clinoid internal carotid arteries. Patent anterior communicating artery. Normal flow related enhancement of the anterior cerebral arteries, including more  distal segments. Abrupt cut off of the left M1 segment, with absent distal left middle cerebral artery vessels. Patent right middle cerebral artery and distal segments, however there is somewhat distal segment luminal irregularity.  Posterior circulation: Left Vertebral artery is dominant. Basilar artery is patent, with normal flow related enhancement of the main branch vessels. Normal flow related enhancement of the posterior cerebral arteries centrally, however there is relative paucity of right P3 branches compared to the left. Mildly irregular P2 segments, to lesser extent left P3 segment.  No hemodynamically significant  stenosis, aneurysm within the anterior nor posterior circulation.  IMPRESSION MRI head: Multiple foci of acute ischemia within the left middle cerebral artery territory. In addition, however punctate focus of acute ischemia within the left occipital lobe and right thalamus (both of which would be posterior circulation). All areas of ischemia have similar acuity. No hemorrhagic conversion.  Symmetric abnormal bright signal within the lenticulostriate nuclei (basal ganglia) in addition to abnormal temporoccipital periventricular T2 hyperintense signal. These findings may reflect sequelae of acute severe hypertension (PRES) but, it can also be seen with osmotic demyelination, autoimmune disorders such as scleroderma (which can have associated vasculopathy), less likely infectious encephalitis.  Moderate to severe white matter changes, in a distribution which can be seen with demyelination, with superimposed possible chronic small vessel ischemic disease though, are nonspecific.  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  Mild luminal irregularity of the intracranial vessels which can be seen with intracranial atherosclerosis though, may also be seen with vasculopathy.  Critical Value/emergent results were called by telephone at the time of interpretation on 05/27/2013 at 10:28 PM to Dr. Calvert Cantor , who verbally acknowledged these results.  SIGNATURE  Electronically Signed   By: Elon Alas   On: 05/27/2013 22:32   Dg Abd Portable 1v  06/03/2013   CLINICAL DATA:  NG tube placement.  EXAM: PORTABLE ABDOMEN - 1 VIEW  COMPARISON:  DG ABD 1 VIEW dated 05/30/2013  FINDINGS: NG tube coiled in stomach. Gas pattern is nonspecific. Calcific densities right upper quadrant consistent with gallstones. Confirmation with ultrasound can be performed.  IMPRESSION: 1. Calcific densities right upper quadrant consistent with gallstones . 2. NG tube coiled in stomach.   Electronically Signed   By: Marcello Moores   Register   On: 06/03/2013 20:34    Microbiology: Recent Results (from the past 240 hour(s))  MRSA PCR SCREENING     Status: None   Collection Time    05/27/13 10:20 PM      Result Value Ref Range Status   MRSA by PCR NEGATIVE  NEGATIVE Final   Comment:            The GeneXpert MRSA Assay (FDA     approved for NASAL specimens     only), is one component of a     comprehensive MRSA colonization     surveillance program. It is not     intended to diagnose MRSA     infection nor to guide or     monitor treatment for     MRSA infections.  CSF CULTURE     Status: None   Collection Time    05/27/13 11:41 PM      Result Value Ref Range Status   Specimen Description CSF   Final   Special Requests Normal   Final   Gram Stain     Final   Value: CYTOSPIN SLIDE NO WBC SEEN     NO ORGANISMS SEEN  Performed at [facility]     Final   Value: NO GROWTH 3 DAYS     Performed at [facility]   Report Status 05/31/2013 FINAL   Final  CULTURE, BLOOD (ROUTINE X 2)     Status: None   Collection Time    05/30/13 11:00 AM      Result Value Ref Range Status   Specimen Description BLOOD RIGHT ARM   Final   Special Requests BOTTLES DRAWN AEROBIC ONLY 4CC   Final   Culture  Setup Time     Final   Value: 05/30/2013 14:31     Performed at [facility]   Culture     Final   Value:        BLOOD CULTURE RECEIVED NO GROWTH TO DATE CULTURE WILL BE HELD FOR 5 DAYS BEFORE ISSUING A FINAL NEGATIVE REPORT     Performed at [facility]   Report Status PENDING   Incomplete  CULTURE, BLOOD (ROUTINE X 2)     Status: None   Collection Time    05/30/13 11:10 AM      Result Value Ref Range Status   Specimen Description BLOOD RIGHT HAND   Final   Special Requests BOTTLES DRAWN AEROBIC ONLY 5CC   Final   Culture  Setup Time     Final   Value: 05/30/2013 14:31     Performed at [facility]   Culture     Final   Value:        BLOOD CULTURE RECEIVED NO GROWTH  TO DATE CULTURE WILL BE HELD FOR 5 DAYS BEFORE ISSUING A FINAL NEGATIVE REPORT     Performed at [facility]   Report Status PENDING   Incomplete  URINE CULTURE     Status: None   Collection Time    05/30/13 12:49 PM      Result Value Ref Range Status   Specimen Description URINE, CATHETERIZED   Final   Special Requests NONE   Final   Culture  Setup Time     Final   Value: 05/30/2013 16:39     Performed at [facility]     Final   Value: NO GROWTH     Performed at [facility]   Culture     Final   Value: NO GROWTH     Performed at [facility]   Report Status 05/31/2013 FINAL   Final     Labs: Basic Metabolic Panel:  Recent Labs Lab 05/30/13 0243  05/30/13 1100  05/31/13 0923 06/01/13 0250 06/02/13 0247 06/03/13 0510 06/04/13 0330  NA 142  < > 139  < > 140 138 138 141 141  K 3.8  --  4.1  --   --  4.0 4.0 4.0 4.0  CL 101  --  100  --   --  102 100 99 99  CO2 23  --  19  --   --  18* 24 26 27   GLUCOSE 213*  --  260*  --   --  157* 274* 282* 250*  BUN 7  --  7  --   --  10 15 16 15   CREATININE 0.67  --  0.66  --   --  0.66 0.69 0.68 0.69  CALCIUM 8.9  --  9.1  --   --  8.8 9.3 9.7 9.8  MG 1.8  --   --   --   --  1.7 1.9 2.0 2.0  PHOS 3.5  --   --   --   --  4.2 3.9 4.4 4.5  < > = values in this interval not displayed. Liver Function Tests:  Recent Labs Lab 05/30/13 1100  AST 25  ALT 11  ALKPHOS 98  BILITOT 0.4  PROT 7.3  ALBUMIN 3.3*   No results found for this basename: LIPASE, AMYLASE,  in the last 168 hours No results found for this basename: AMMONIA,  in the last 168 hours CBC:  Recent Labs Lab 05/30/13 1100 06/01/13 0250 06/02/13 0247 06/03/13 0510 06/04/13 0330  WBC 9.8 9.8 10.6* 9.3 10.0  NEUTROABS  --  6.5 7.3 6.0 6.3  HGB 15.5* 14.1 14.7 14.5 14.4  HCT 44.0 40.5 41.3 41.1 41.1  MCV 86.8 88.0 86.8 87.3 88.4  PLT 250 238 258 247 279   Cardiac Enzymes:  Recent Labs Lab 05/30/13 1100  05/30/13 1640 05/30/13 2000 05/31/13 0150  CKTOTAL 87 84  --   --   CKMB 1.0 1.0  --   --   TROPONINI <0.30 <0.30 <0.30 <0.30   BNP: BNP (last 3 results) No results found for this basename: PROBNP,  in the last 8760 hours CBG:  Recent Labs Lab 06/03/13 1635 06/03/13 2019 06/04/13 0032 06/04/13 0443 06/04/13 0756  GLUCAP 226* 280* 230* 259* 275*       Signed:  Analucia Hush A  [facility] 06/04/2013, 9:41 AM   Patient was getting aspirin for secondary stroke prevention. This will need to be continue on transfer.

## 2013-06-04 NOTE — Progress Notes (Signed)
Patient with elevated BP of 202/118. Patient denies dizziness, lightheadedness. Spoke with Reesa Chew, PA. Check vital signs every four hours. Aware of vital signs and condition of patient. Tonita Cong, RN

## 2013-06-05 ENCOUNTER — Inpatient Hospital Stay (HOSPITAL_COMMUNITY): Payer: Medicaid Other | Admitting: Occupational Therapy

## 2013-06-05 ENCOUNTER — Inpatient Hospital Stay (HOSPITAL_COMMUNITY): Payer: Medicaid Other | Admitting: Speech Pathology

## 2013-06-05 ENCOUNTER — Inpatient Hospital Stay (HOSPITAL_COMMUNITY): Payer: Medicaid Other | Admitting: Physical Therapy

## 2013-06-05 DIAGNOSIS — G8110 Spastic hemiplegia affecting unspecified side: Secondary | ICD-10-CM

## 2013-06-05 LAB — CBC WITH DIFFERENTIAL/PLATELET
Basophils Absolute: 0 10*3/uL (ref 0.0–0.1)
Basophils Relative: 0 % (ref 0–1)
EOS ABS: 0.3 10*3/uL (ref 0.0–0.7)
Eosinophils Relative: 3 % (ref 0–5)
HEMATOCRIT: 41.7 % (ref 36.0–46.0)
HEMOGLOBIN: 14.4 g/dL (ref 12.0–15.0)
Lymphocytes Relative: 29 % (ref 12–46)
Lymphs Abs: 2.7 10*3/uL (ref 0.7–4.0)
MCH: 30.9 pg (ref 26.0–34.0)
MCHC: 34.5 g/dL (ref 30.0–36.0)
MCV: 89.5 fL (ref 78.0–100.0)
MONO ABS: 0.9 10*3/uL (ref 0.1–1.0)
MONOS PCT: 9 % (ref 3–12)
Neutro Abs: 5.6 10*3/uL (ref 1.7–7.7)
Neutrophils Relative %: 59 % (ref 43–77)
Platelets: 285 10*3/uL (ref 150–400)
RBC: 4.66 MIL/uL (ref 3.87–5.11)
RDW: 12.1 % (ref 11.5–15.5)
WBC: 9.5 10*3/uL (ref 4.0–10.5)

## 2013-06-05 LAB — COMPREHENSIVE METABOLIC PANEL
ALT: 12 U/L (ref 0–35)
AST: 21 U/L (ref 0–37)
Albumin: 3.2 g/dL — ABNORMAL LOW (ref 3.5–5.2)
Alkaline Phosphatase: 129 U/L — ABNORMAL HIGH (ref 39–117)
BUN: 17 mg/dL (ref 6–23)
CHLORIDE: 97 meq/L (ref 96–112)
CO2: 31 mEq/L (ref 19–32)
CREATININE: 0.78 mg/dL (ref 0.50–1.10)
Calcium: 10.3 mg/dL (ref 8.4–10.5)
GFR calc Af Amer: >90 mL/min (ref >90)
GFR calc non Af Amer: >90 mL/min (ref >90)
Glucose, Bld: 277 mg/dL — ABNORMAL HIGH (ref 70–99)
Potassium: 4.4 mEq/L (ref 3.7–5.3)
Sodium: 141 mEq/L (ref 137–147)
TOTAL PROTEIN: 7.6 g/dL (ref 6.0–8.3)

## 2013-06-05 LAB — CULTURE, BLOOD (ROUTINE X 2)
CULTURE: NO GROWTH
Culture: NO GROWTH

## 2013-06-05 LAB — GLUCOSE, CAPILLARY
GLUCOSE-CAPILLARY: 210 mg/dL — AB (ref 70–99)
GLUCOSE-CAPILLARY: 238 mg/dL — AB (ref 70–99)
GLUCOSE-CAPILLARY: 285 mg/dL — AB (ref 70–99)
GLUCOSE-CAPILLARY: 289 mg/dL — AB (ref 70–99)
Glucose-Capillary: 256 mg/dL — ABNORMAL HIGH (ref 70–99)
Glucose-Capillary: 260 mg/dL — ABNORMAL HIGH (ref 70–99)
Glucose-Capillary: 350 mg/dL — ABNORMAL HIGH (ref 70–99)

## 2013-06-05 MED ORDER — METOPROLOL TARTRATE 25 MG/10 ML ORAL SUSPENSION
12.5000 mg | Freq: Two times a day (BID) | ORAL | Status: DC
  Administered 2013-06-05 (×2): 12.5 mg
  Filled 2013-06-05 (×5): qty 5

## 2013-06-05 MED ORDER — GLUCERNA 1.2 CAL PO LIQD
1000.0000 mL | ORAL | Status: DC
  Administered 2013-06-05 – 2013-06-15 (×12): 1000 mL
  Filled 2013-06-05 (×26): qty 1000

## 2013-06-05 NOTE — Progress Notes (Signed)
Vitals at 2152: 227/121, 99.2, 120, 18, 93% RA. Patient's asymptomatic. Reesa Chew, PA. made aware at 2200. New order for Clonidine 0.1 mg (1 x dose) given at 2223. Vitals at 0158: 98.5, 173/125, 111, 18, 98% RA. In bed with eyes close. Call bell and personal items within reach. Maliya Marich A. Jaydeen Odor, RN.

## 2013-06-05 NOTE — Progress Notes (Addendum)
INITIAL NUTRITION ASSESSMENT  DOCUMENTATION CODES Per approved criteria  -Not Applicable   INTERVENTION: 1.  Enteral nutrition; Advance Glucerna 1.2 to 75 ml/hr x 20 hours daily. (Anticipate TF to be held 4 hours daily for participation in therapy.) 2.  Continue free water flushes.  Pt will receive 1800 kcal, 90g protein, and 2065 ml free water daily.   NUTRITION DIAGNOSIS: Inadequate oral intake related to inability to eat, dysphagia as evidenced by SLP assessment.   Monitor:  1.  Enteral nutrition; initiation with tolerance.  Pt to meet >/=90% estimated needs with nutrition support.  2.  Wt/wt change; monitor trends  Reason for Assessment: TF  53 y.o. female  Admitting Dx: Deconditioning  ASSESSMENT: 53 y.o. female with history of HTN, DM, bipolar disorder, who was admitted on 05/27/13 with right facial droop and aphasia. 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   Pt is able to communicate with nods and gestures, however is aphasic. She is currently getting Glucerna 1.2 @ 65 mL/hr continuous which provides 1872 kcal, 93g protein. She has additional free water flushes of 200 mL 4 times daily providing 2065 mL free water with flushes and TF.   Height: Ht Readings from Last 1 Encounters:  05/27/13 5\' 6"  (1.676 m)    Weight: Wt Readings from Last 1 Encounters:  06/05/13 156 lb 15.5 oz (71.2 kg)    Ideal Body Weight: 130 lbs  % Ideal Body Weight: 120%  Wt Readings from Last 10 Encounters:  06/05/13 156 lb 15.5 oz (71.2 kg)  06/04/13 162 lb (73.483 kg)  06/04/13 162 lb (73.483 kg)  06/04/13 162 lb (73.483 kg)    Usual Body Weight: 160 lbs  % Usual Body  Weight: 97%  BMI:  Body mass index is 25.35 kg/(m^2).  Estimated Nutritional Needs: Kcal: 2119-4174 Protein: 70-85g Fluid: >1.8 L/day  Skin: intact  Diet Order: NPO  EDUCATION NEEDS: -No education needs identified at this time  No intake or output data in the 24 hours ending 06/05/13 1137  Last BM: PTA   Labs:   Recent Labs Lab 06/02/13 0247 06/03/13 0510 06/04/13 0330 06/05/13 0600  NA 138 141 141 141  K 4.0 4.0 4.0 4.4  CL 100 99 99 97  CO2 24 26 27 31   BUN 15 16 15 17   CREATININE 0.69 0.68 0.69 0.78  CALCIUM 9.3 9.7 9.8 10.3  MG 1.9 2.0 2.0  --   PHOS 3.9 4.4 4.5  --   GLUCOSE 274* 282* 250* 277*    CBG (last 3)   Recent Labs  06/05/13 0009 06/05/13 0405 06/05/13 0934  GLUCAP 210* 260* 285*    Scheduled Meds: . aspirin  300 mg Rectal Daily  . enoxaparin (LOVENOX) injection  30 mg Subcutaneous Q24H  . free water  200 mL Per Tube TID WC & HS  . insulin aspart  0-15 Units Subcutaneous 6 times per day  . insulin glargine  10 Units Subcutaneous BID  . metoprolol tartrate  12.5 mg Per Tube BID  . nystatin  5 mL Oral QID  . simvastatin  20 mg Per Tube q1800    Continuous Infusions: . feeding supplement (GLUCERNA 1.2 CAL) 1,000 mL (06/04/13 2249)    Past  Medical History  Diagnosis Date  . Hypertension   . Depression   . Diabetes mellitus     ?type (05/29/2013)  . Bipolar affective disorder   . Schizophrenia     [name] 11/28/2007 (05/29/2013)  . Stroke 05/27/2013    Past Surgical History  Procedure Laterality Date  . Cesarean section  ~ 1987  . Tubal ligation      Brynda Greathouse, MS RD LDN Clinical Inpatient Dietitian Pager: [phone] Weekend/After hours pager: [phone]

## 2013-06-05 NOTE — Progress Notes (Signed)
Inpatient Diabetes Program Recommendations  AACE/ADA: New Consensus Statement on Inpatient Glycemic Control (2013)  Target Ranges:  Prepandial:   less than 140 mg/dL      Peak postprandial:   less than 180 mg/dL (1-2 hours)      Critically ill patients:  140 - 180 mg/dL   Results for [name], [name] (MRN [id number]) as of 06/05/2013 11:21  Ref. Range 06/04/2013 07:56 06/04/2013 11:28 06/04/2013 16:47 06/04/2013 19:58 06/05/2013 00:09 06/05/2013 04:05 06/05/2013 09:34  Glucose-Capillary Latest Range: 70-99 mg/dL 275 (H) 214 (H) 263 (H) 271 (H) 210 (H) 260 (H) 285 (H)   Diabetes history: Type 2 diabetes-Note that A1C was 14.1% on 05/30/13  Outpatient Diabetes medications: None  Current orders for Inpatient glycemic control: Lantus 10 units BID, Novolog 0-15 units Q4H  Inpatient Diabetes Program Recommendations Insulin - Basal:Noted Lantus was increased to 10 units BID on 06/04/13. Insulin - Meal Coverage: Please consider ordering Novolog 3 units Q4H for tube feeding coverage (in addition to current glycemic correction scale).  Note: On 3/18 patient received Lantus 10 units and a total of Novolog 42 units for correction.  So far today, patient has received Lantus 10 units and Novolog 21 units for correction. Noted Lantus was increased to 10 units BID yesterday afternoon. Since patient is receiving Glucerna at 60 ml/hr patient would benefit from receiving Novolog 3 units Q4H for tube feeding coverage to further improve glycemic control.  Thanks, Barnie Alderman, RN, MSN, CCRN Diabetes Coordinator Inpatient Diabetes Program [phone] (Team Pager) [phone] ([facility] office) [phone] [facility] office)

## 2013-06-05 NOTE — Care Management Note (Signed)
Inpatient Rehabilitation Center Individual Statement of Services  Patient Name:  [name]  Date:  06/05/2013  Welcome to the [facility].  Our goal is to provide you with an individualized program based on your diagnosis and situation, designed to meet your specific needs.  With this comprehensive rehabilitation program, you will be expected to participate in at least 3 hours of rehabilitation therapies Monday-Friday, with modified therapy programming on the weekends.  Your rehabilitation program will include the following services:  Physical Therapy (PT), Occupational Therapy (OT), Speech Therapy (ST), 24 hour per day rehabilitation nursing, Therapeutic Recreaction (TR), Neuropsychology, Case Management (Social Worker), Rehabilitation Medicine, Nutrition Services and Pharmacy Services  Weekly team conferences will be held on Wednesday to discuss your progress.  Your Social Worker will talk with you frequently to get your input and to update you on team discussions.  Team conferences with you and your family in attendance may also be held.  Expected length of stay: 21-28 days Overall anticipated outcome: min level  Depending on your progress and recovery, your program may change. Your Social Worker will coordinate services and will keep you informed of any changes. Your Social Worker's name and contact numbers are listed  below.  The following services may also be recommended but are not provided by the [facility] will be made to provide these services after discharge if needed.  Arrangements include referral to agencies that provide these services.  Your insurance has been verified to be:  Pending Medicaid application 5/05 Your primary doctor is:  None  Pertinent information will be shared with your  doctor and your insurance company.  Social Worker:  Ovidio Kin, [facility] or ([phone]  Information discussed with and copy given to patient by: Elease Hashimoto, 06/05/2013, 2:17 PM

## 2013-06-05 NOTE — Progress Notes (Signed)
Subjective/Complaints: 53 y.o. female with history of HTN, DM, bipolar disorder, who was admitted on 05/27/13 with right facial droop and aphasia. 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 2 D echo with EF 55-60%. Carotid dopplers without significant ICA stenosis. 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 Patient with progressive neurologic worsening on 05/29/13 pm with RUE >RLE weakness. Started on hypertonic fluids and transferred to ICU for closer monitoring. Follow up CCT without significant changes. Patient with decrease in LOC with worsening of symptoms  No issues overnite  Review of Systems - Negative except aphasic not able to consistently do Y/N Objective: Vital Signs: Blood pressure 190/120, pulse 115, temperature 98.7 F (37.1 C), temperature source Oral, resp. rate 18, weight 71.2 kg (156 lb 15.5 oz), SpO2 96.00%. Dg Abd Portable 1v  06/03/2013   CLINICAL DATA:  NG tube placement.  EXAM: PORTABLE ABDOMEN - 1 VIEW  COMPARISON:  DG ABD 1 VIEW dated 05/30/2013  FINDINGS: NG tube coiled in stomach. Gas pattern is nonspecific. Calcific densities right upper quadrant consistent with gallstones. Confirmation with ultrasound can be performed.  IMPRESSION: 1. Calcific densities right upper quadrant consistent with gallstones . 2. NG tube coiled in stomach.   Electronically Signed   By: Marcello Moores  Register   On: 06/03/2013 20:34   Results for orders placed during the hospital encounter of 06/04/13 (from the past 72 hour(s))  GLUCOSE, CAPILLARY     Status: Abnormal   Collection Time    06/04/13  4:47 PM      Result Value Ref  Range   Glucose-Capillary 263 (*) 70 - 99 mg/dL   Comment 1 Notify RN    GLUCOSE, CAPILLARY     Status: Abnormal   Collection Time    06/04/13  7:58 PM      Result Value Ref Range   Glucose-Capillary 271 (*) 70 - 99 mg/dL  GLUCOSE, CAPILLARY     Status: Abnormal   Collection Time    06/05/13 12:09 AM      Result Value Ref Range   Glucose-Capillary 210 (*) 70 - 99 mg/dL   Comment 1 Notify RN    GLUCOSE, CAPILLARY     Status: Abnormal   Collection Time    06/05/13  4:05 AM      Result Value Ref Range   Glucose-Capillary 260 (*) 70 - 99 mg/dL  CBC WITH DIFFERENTIAL     Status: None   Collection Time    06/05/13  6:00 AM      Result Value Ref Range   WBC 9.5  4.0 - 10.5 K/uL   RBC 4.66  3.87 - 5.11 MIL/uL   Hemoglobin 14.4  12.0 - 15.0 g/dL   HCT 41.7  36.0 - 46.0 %   MCV 89.5  78.0 - 100.0 fL   MCH 30.9  26.0 - 34.0 pg   MCHC 34.5  30.0 - 36.0 g/dL   RDW 12.1  11.5 - 15.5 %   Platelets 285  150 - 400 K/uL   Neutrophils Relative % 59  43 - 77 %   Neutro Abs 5.6  1.7 - 7.7 K/uL   Lymphocytes Relative 29  12 - 46 %   Lymphs Abs 2.7  0.7 - 4.0 K/uL   Monocytes Relative 9  3 - 12 %   Monocytes Absolute 0.9  0.1 - 1.0 K/uL   Eosinophils Relative 3  0 - 5 %   Eosinophils Absolute 0.3  0.0 - 0.7 K/uL   Basophils Relative 0  0 - 1 %   Basophils Absolute 0.0  0.0 - 0.1 K/uL  COMPREHENSIVE METABOLIC PANEL     Status: Abnormal   Collection Time    06/05/13  6:00 AM      Result Value Ref Range   Sodium 141  137 - 147 mEq/L   Potassium 4.4  3.7 - 5.3 mEq/L   Chloride 97  96 - 112 mEq/L   CO2 31  19 - 32 mEq/L   Glucose, Bld 277 (*) 70 - 99 mg/dL   BUN 17  6 - 23 mg/dL   Creatinine, Ser 0.78  0.50 - 1.10 mg/dL   Calcium 10.3  8.4 - 10.5 mg/dL   Total Protein 7.6  6.0 - 8.3 g/dL   Albumin 3.2 (*) 3.5 - 5.2 g/dL   AST 21  0 - 37 U/L   ALT 12  0 - 35 U/L   Alkaline Phosphatase 129 (*) 39 - 117 U/L   Total Bilirubin <0.2 (*) 0.3 - 1.2 mg/dL   GFR calc non Af Amer >90  >90 mL/min    GFR calc Af Amer >90  >90 mL/min   Comment: (NOTE)     The eGFR has been calculated using the CKD EPI equation.     This calculation has not been validated in all clinical situations.     eGFR's persistently <90 mL/min signify possible Chronic Kidney     Disease.     HEENT: normal and NG tube left nares, no drainage Cardio: RRR and no murmur Resp: CTA B/L and unlabored GI: BS positive and non distended Extremity:  Pulses positive and No Edema Skin:   Intact Neuro: Lethargic, Flat, Cranial Nerve Abnormalities R central 7, Abnormal Sensory withdraws to pinch in RUE adn RLE, Abnormal Motor 0/5 RUE and RLE, normal on Left and Aphasic Musc/Skel:  Other no pain with R shoulder ROM Gen NAD   Assessment/Plan: 1. Functional deficits secondary to Left MCA infarct which require 3+ hours per day of interdisciplinary therapy in a comprehensive inpatient rehab setting. Physiatrist is providing close team supervision and 24 hour management of active medical problems listed below. Physiatrist and rehab team continue to assess barriers to discharge/monitor patient progress toward functional and medical goals. FIM:                   Comprehension Comprehension Mode: Auditory Comprehension: 2-Understands basic 25 - 49% of the time/requires cueing 51 - 75% of the time  Expression Expression Mode: Nonverbal Expression: 2-Expresses basic 25 - 49% of the time/requires cueing 50 - 75% of the time. Uses single words/gestures.  Social Interaction Social Interaction: 1-Interacts appropriately less than 25% of the time. May be withdrawn or combative.  Problem Solving Problem Solving: 1-Solves basic less than 25% of the time - needs direction nearly all the time or does not effectively solve problems and may need a restraint for safety  Memory Memory: 1-Recognizes or recalls less than 25% of the time/requires cueing greater than 75% of the time  Medical Problem List and Plan:  Embolic  cva, dense Left MCA?PRES  1. DVT Prophylaxis/Anticoagulation: Pharmaceutical: Lovenox  2. Pain  Management: N/A  3. Bipolar disorder/Mood: No signs of distress reported but patient with poor awareness of deficits. Monitor mood for now. LCSW to follow with patient and family.  4. Neuropsych: This patient is not capable of making decisions on her own behalf.  5. DM type 2: Will monitor with bid checks. Will increase lantus to bid--anticipate higher BS with tube feeds. Will titrate as needed. Use SSI for elevated BS.  6. HTN: Will monitor with bid checks. Avoid hypotension with permissive high BP 180-200 range to prevent hypoperfusion/neuro worsening  7. H/o paranoid schizophrenia: Has been to [facility] in the past due to decompensation. Not on medications as this time--has been on respirdal in the past and has history of non-compliance per records  8. Dysphagia: NPO with panda TF. Likely will need G tube given presentation.  9. Resting Tachycardia: Check baseline EKG--HR 110-120 range at rest. Check HR with activity level. start low dose BB for heart rate control if HR increases further with activity. Add water flushes to avoid dehydration.   LOS (Days) 1 A FACE TO FACE EVALUATION WAS PERFORMED  Kysen Wetherington E 06/05/2013, 7:26 AM

## 2013-06-05 NOTE — Evaluation (Signed)
Physical Therapy Assessment and Plan  Patient Details  Name: [name] MRN: [id number] Date of Birth: 10-05-1960  PT Diagnosis: Abnormal posture, Abnormality of gait, Cognitive deficits, Difficulty walking, Hemiplegia dominant, Hypotonia, Impaired sensation, Muscle weakness and Pain in on R side  Rehab Potential: Good ELOS: 21-28 days   Today's Date: 06/05/2013 Time: 2263-3354 Time Calculation (min): 46 min  Problem List:  Patient Active Problem List   Diagnosis Date Noted  . CVA (cerebral infarction) 06/04/2013  . Dysphagia 06/02/2013  . Benign polycythemia 06/02/2013  . Malignant hypertension 05/28/2013  . DM (diabetes mellitus) type II controlled, neurological manifestation 05/27/2013  . Acute embolic stroke 56/25/6389  . 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 Hypertensive emergency 05/27/2013  . Stroke 05/27/2013  . Aphasia complicating stroke 37/34/2876    Past Medical History:  Past Medical History  Diagnosis Date  . Hypertension   . Depression   . Diabetes mellitus     ?type (05/29/2013)  . Bipolar affective disorder   . Schizophrenia     [name] 11/28/2007 (05/29/2013)  . Stroke 05/27/2013   Past Surgical History:  Past Surgical History  Procedure Laterality Date  . Cesarean section  ~ 1987  . Tubal ligation      Assessment & Plan Clinical Impression: Patient is a 53 y.o. female with history of HTN, DM, bipolar disorder, who was admitted on 05/27/13 with right facial droop and aphasia. 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 2 D echo with EF 55-60%. Carotid dopplers without significant ICA stenosis. Infarcts felt to be embolic due to unknown  source and patient placed on ASA for secondary stroke prevention. Patient with progressive neurologic worsening on 05/29/13 pm with RUE >RLE weakness. Started on hypertonic fluids and transferred to ICU for closer monitoring. Follow up CCT without significant changes. Patient with decrease in LOC with worsening of symptoms. EEG done showing frequent sharp activity in left hemisphere s/o focal nonspecific disturbance. Patient NPO due to tendency to hold po's and panda placed for nutritional support. Due to hypotension, TEE not recommended but patient may need loop recorder and/or TEE in the future. BP remains labile but lethargy resolving. Patient with resultant right hemiparesis, expressive aphasia with occasional phonation and needs multimodal cues to follow commands as well as dysphagia.  Patient transferred to CIR on 06/04/2013 .   Patient currently requires total with mobility secondary to muscle weakness, decreased cardiorespiratoy endurance, impaired timing and sequencing, abnormal tone, unbalanced muscle activation, motor apraxia, decreased coordination and decreased motor planning, decreased attention to right, decreased initiation, decreased attention, decreased awareness, decreased problem solving and delayed processing and decreased sitting balance, decreased standing balance, decreased postural control, hemiplegia and decreased balance strategies.  Prior to hospitalization, patient was independent  with mobility and lived with Alone;Daughter (pt unable to report if she lives alone or with daughter) in a House home. Per OT; son present during OT evaluation and pt daughter does live with pt but daughter does attend school during the day and is home most nights.  Son is attempting to find a job closer in order to assist his mother.  Home access is   (unknown).  Patient will benefit from skilled PT intervention to maximize safe functional mobility,  minimize fall risk and decrease caregiver burden for  planned discharge home with 24 hour assist.  Anticipate patient will benefit from follow up [place] at discharge.  PT - End of Session Activity Tolerance: Tolerates < 10 min activity with changes in vital signs Endurance Deficit: Yes Endurance Deficit Description: significant changes in BP and HR with minimal activity PT Assessment Rehab Potential: Good Barriers to Discharge: Decreased caregiver support;Inaccessible home environment (unsure of family ability to provide 24/7; unsure of home set up ) PT Patient demonstrates impairments in the following area(s): Balance;Endurance;Motor;Pain;Perception;Safety;Sensory PT Transfers Functional Problem(s): Bed Mobility;Bed to Chair;Car;Furniture PT Locomotion Functional Problem(s): Ambulation;Wheelchair Mobility;Stairs PT Plan PT Intensity: Minimum of 1-2 x/day ,45 to 90 minutes PT Frequency: 5 out of 7 days PT Duration Estimated Length of Stay: 21-28 days PT Treatment/Interventions: Ambulation/gait training;Balance/vestibular training;Cognitive remediation/compensation;Discharge planning;Disease management/prevention;DME/adaptive equipment instruction;Functional mobility training;Neuromuscular re-education;Pain management;Patient/family education;Psychosocial support;Splinting/orthotics;Stair training;Therapeutic Activities;Therapeutic Exercise;UE/LE Strength taining/ROM;UE/LE Coordination activities;Visual/perceptual remediation/compensation;Wheelchair propulsion/positioning PT Transfers Anticipated Outcome(s): Supervision PT Locomotion Anticipated Outcome(s): Min A PT Recommendation Recommendations for Other Services: Neuropsych consult Follow Up Recommendations: Home health PT;24 hour supervision/assistance Patient destination: Home Equipment Recommended: To be determined  Skilled Therapeutic Intervention Secondary to elevated BP upon sitting EOB did not attempt OOB activity today.  Pt did sit EOB with min-mod A x 10-15 minutes while awaiting RN to  assess NG tub placement.  While sitting EOB pt engaged in sitting balance, trunk control and LE activation training.  Pt able to follow cues with extra time for processing.  Pt returned to supine with +2 A to allow RN to assess NG tube placement.  Assisted pt with rolling to L and R to change pad underneath her and for repositioning in bed with +2 A.  Will attempt OOB activity tomorrow if BP stable.    PT Evaluation Precautions/Restrictions Precautions Precautions: Fall Precaution Comments: right hemiperesis, NPO and maintain HOB >30 deg; MONITOR VITALS; Alert RN for BP >180/>105 and hold activity for BP 220/120, monitor for signs of aspiration or abdominal pain and distention General Chart Reviewed: Yes Amount of Missed PT Time (min): 14 Minutes Missed Time Reason: Other (comment) (elevated BP) Response to Previous Treatment: Not applicable Family/Caregiver Present: No  Vital SignsTherapy Vitals Temp: 99 F (37.2 C) Temp src: Oral Pulse Rate: 118 BP: 170/110 mmHg Patient Position, if appropriate: Lying Oxygen Therapy SpO2: 96 % O2 Device: None (Room air) Pain Pain Assessment Pain Assessment: Faces Faces Pain Scale: Hurts a little bit Pain Type: Acute pain Pain Location: Abdomen Pain Orientation: Mid Pain Onset: Unable to tell Pain Intervention(s): RN made aware Home Living/Prior Functioning Home Living Available Help at Discharge: Family;Available PRN/intermittently Type of Home: House Home Access:  (unknown) Entrance Stairs-Rails:  (unknown) Home Layout:  (unknown) Additional Comments: Pt with inconsistent, inaccurate yes/no responses; need family to confirm home information  Lives With: Alone;Daughter (pt unable to report if she lives alone or with daughter) Prior Function Level of Independence: Independent with homemaking with ambulation;Independent with gait;Independent with transfers  Able to Take Stairs?: Yes Driving: Yes Vocation:  (Pt employed but unable to  communicate PT or FT) Vision/Perception  Vision - Assessment Additional Comments: Did not formally assess; pt makes very limited eye contact and maintains head rotated to R and visual gaze to R and downwards but able to turn her head and look in various directions with cues Perception Perception: Impaired Inattention/Neglect: Does not attend to right side of body;Impaired-to be further tested in functional context Praxis Praxis: Impaired Praxis Impairment Details: Initiation;Motor  planning Praxis-Other Comments: Impaired initiation and motor planning vs. receptive aphasia and delayed processing???  Cognition Overall Cognitive Status: Impaired/Different from baseline Arousal/Alertness: Awake/alert Attention: Sustained;Selective Sustained Attention: Impaired Sustained Attention Impairment: Functional basic Selective Attention: Impaired Selective Attention Impairment: Functional basic Problem Solving: Impaired Problem Solving Impairment: Functional basic Executive Function: Sequencing;Initiating Sequencing: Impaired Sequencing Impairment: Functional basic Initiating: Impaired Initiating Impairment: Functional basic Sensation Sensation Light Touch: Impaired by gross assessment Stereognosis: Not tested Hot/Cold: Not tested Proprioception: Impaired by gross assessment Coordination Gross Motor Movements are Fluid and Coordinated: No Coordination and Movement Description: impaired by hemiplegia; pt appears to have active movement and strength in RLE but impaired initiation and motor planning Motor  Motor Motor: Hemiplegia;Abnormal tone;Motor apraxia;Abnormal postural alignment and control Motor - Skilled Clinical Observations: R hemiplegia, hypotonia RUE, impaired motor planning and sequencing, in sitting presents with increased muscle activity in LUE and LLE to stabilize; slight pushing to R  Mobility Bed Mobility Bed Mobility: Supine to Sit;Sit to Supine;Rolling Right;Rolling  Left Rolling Right: 1: +2 Total assist;With rail Rolling Right Details (indicate cue type and reason): With +2 A to maintain LE and feet in hooklying position to assess use and activation of RLE during rolling; total verbal, tactile and visual cues to sequence rolling to replace soiled pad under pt. Rolling Left: 1: +2 Total assist;With rail Rolling Left Details (indicate cue type and reason): With +2 A with activation of RLE noted during rolling to L; total verbal, tactile and visual cues to sequence rolling to replace soiled pad under pt. Supine to Sit: 2: Max assist;HOB elevated Supine to Sit Details (indicate cue type and reason): When cued to sit EOB pt able to initiate supine > sit with extra time for processing; pt did perform 25% of transfer to EOB but required assistance to advance RLE to EOB and lifting assistance to bring trunk to upright position EOB Sit to Supine: 1: +2 Total assist Sit to Supine - Details (indicate cue type and reason): +2 A to returned to supine with HOB elevated 30 deg with assistance to bring RLE into bed and for repositioning to [facility] Transfers Transfers: No (secondary to elevated BP) Locomotion  Ambulation Ambulation: No Stairs / Additional Locomotion Stairs: No Wheelchair Mobility Wheelchair Mobility: No  Trunk/Postural Assessment  Cervical Assessment Cervical Assessment: Exceptions to [facility] (keeps head/neck in R rotation and flexion; limited L rotation and extension) Thoracic Assessment Thoracic Assessment: Within Functional Limits Lumbar Assessment Lumbar Assessment: Within Functional Limits Postural Control Postural Control: Deficits on evaluation (increased activation on L resulting in mild pushing to R and posterior in sitting)  Balance Static Sitting Balance Static Sitting - Balance Support: Right upper extremity supported;Left upper extremity supported;Feet supported Static Sitting - Level of Assistance: 4: Min assist Dynamic Sitting  Balance Dynamic Sitting - Balance Support: No upper extremity supported;Feet supported;Left upper extremity supported Dynamic Sitting - Level of Assistance: 3: Mod assist Dynamic Sitting Balance - Compensations: Pt with increased use of LUE for stabilization; attempted to distract LUE by having it engage in functional activities including wiping mouth with washcloth and doffing soiled gown and donning clean gown; also attempted to distract LLE by having it kick at a target to assess activation of RLE in sitting for stability; required mod A  overall to prevent posterior and R LOB Extremity Assessment  RLE Assessment RLE Assessment: Exceptions to [facility] RLE Strength RLE Overall Strength: Deficits;Due to impaired cognition RLE Overall Strength Comments: unable to formally assess; no active movement noted when cued but  active extension/pushing noted during rolling in bed LLE Assessment LLE Assessment: Within Functional Limits  FIM:  FIM - Bed/Chair Transfer Bed/Chair Transfer: 2: Supine > Sit: Max A (lifting assist/Pt. 25-49%);1: Two helpers (did not perform bed <> chair secondary to elevated BP) FIM - Locomotion: Wheelchair Locomotion: Wheelchair: 0: Activity did not occur (no OOB activity secondary to elevated BP) FIM - Locomotion: Ambulation Locomotion: Ambulation: 0: Activity did not occur (secondary to elevated BP) FIM - Locomotion: Stairs Locomotion: Stairs: 0: Activity did not occur (secondary to elevated BP)   Refer to Care Plan for Long Term Goals  Recommendations for other services: Neuropsych  Discharge Criteria: Patient will be discharged from PT if patient refuses treatment 3 consecutive times without medical reason, if treatment goals not met, if there is a change in medical status, if patient makes no progress towards goals or if patient is discharged from hospital.  The above assessment, treatment plan, treatment alternatives and goals were discussed and mutually agreed upon:  by patient  Raylene Everts Faucette 06/05/2013, 10:01 AM

## 2013-06-05 NOTE — Evaluation (Signed)
Occupational Therapy Assessment and Plan  Patient Details  Name: [name] MRN: [id number] Date of Birth: Oct 20, 1960  OT Diagnosis: apraxia, cognitive deficits, disturbance of vision, flaccid hemiplegia and hemiparesis, hemiplegia affecting dominant side and muscle weakness (generalized) Rehab Potential: Rehab Potential: Fair ELOS: 21-24 days   Today's Date: 06/05/2013 Time: 1696-7893 Time Calculation (min): 50 min  Problem List:  Patient Active Problem List   Diagnosis Date Noted  . CVA (cerebral infarction) 06/04/2013  . Dysphagia 06/02/2013  . Benign polycythemia 06/02/2013  . Malignant hypertension 05/28/2013  . DM (diabetes mellitus) type II controlled, neurological manifestation 05/27/2013  . Acute embolic stroke 81/03/7508  . 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 Hypertensive emergency 05/27/2013  . Stroke 05/27/2013  . Aphasia complicating stroke 25/85/2778    Past Medical History:  Past Medical History  Diagnosis Date  . Hypertension   . Depression   . Diabetes mellitus     ?type (05/29/2013)  . Bipolar affective disorder   . Schizophrenia     [name] 11/28/2007 (05/29/2013)  . Stroke 05/27/2013   Past Surgical History:  Past Surgical History  Procedure Laterality Date  . Cesarean section  ~ 1987  . Tubal ligation      Assessment & Plan Clinical Impression: Patient is a 53 y.o. right handed female with history of HTN, DM, bipolar disorder, who was admitted on 05/27/13 with right facial droop and aphasia. 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 2 D echo with EF 55-60%. Carotid dopplers without significant ICA stenosis. Infarcts felt to be  embolic due to unknown source and patient placed on ASA for secondary stroke prevention. Patient with progressive neurologic worsening on 05/29/13 pm with RUE >RLE weakness. Started on hypertonic fluids and transferred to ICU for closer monitoring. Follow up CCT without significant changes. Patient with decrease in LOC with worsening of symptoms. EEG done showing frequent sharp activity in left hemisphere s/o focal nonspecific disturbance. Patient NPO due to tendency to hold po's and panda placed for nutritional support. Due to hypotension, TEE not recommended but patient may need loop recorder and/or TEE in the future. BP remains labile but lethargy resolving. Patient with resultant right hemiparesis, expressive aphasia with occasional phonation and needs multimodal cues to follow commands as well as dysphagia. Rehab team recommending CIR.   Patient transferred to CIR on 06/04/2013 .    Patient currently requires total with basic self-care skills secondary to muscle weakness, elevated BP with mobility, unbalanced muscle activation, motor apraxia, decreased coordination and decreased motor planning, decreased attention to right and decreased initiation, decreased attention, decreased memory and delayed processing.  Prior to hospitalization, patient could complete ADLs with independent .  Patient will benefit from skilled intervention to decrease level of assist with basic self-care skills and increase independence with basic self-care skills prior to discharge home with care partner.  Anticipate patient will require intermittent supervision and minimal physical assistance and follow up home health.  OT - End of Session Activity Tolerance: Tolerates < 10 min activity with changes in vital signs Endurance Deficit: Yes Endurance Deficit Description: significant changes in BP and HR with minimal activity OT Assessment Rehab Potential: Fair Barriers to Discharge: Decreased caregiver support OT Patient  demonstrates impairments  in the following area(s): Balance;Endurance;Motor;Perception;Safety;Sensory OT Basic ADL's Functional Problem(s): Grooming;Bathing;Dressing;Toileting OT Transfers Functional Problem(s): Toilet;Tub/Shower OT Additional Impairment(s): Fuctional Use of Upper Extremity OT Plan OT Intensity: Minimum of 1-2 x/day, 45 to 90 minutes OT Frequency: 5 out of 7 days OT Duration/Estimated Length of Stay: 21-24 days OT Treatment/Interventions: Balance/vestibular training;Cognitive remediation/compensation;Discharge planning;DME/adaptive equipment instruction;Functional mobility training;Neuromuscular re-education;Pain management;Patient/family education;Psychosocial support;Self Care/advanced ADL retraining;Therapeutic Activities;Splinting/orthotics;Therapeutic Exercise;UE/LE Strength taining/ROM;UE/LE Coordination activities;Visual/perceptual remediation/compensation OT Basic Self-Care Anticipated Outcome(s): min assist OT Toileting Anticipated Outcome(s): min assist OT Bathroom Transfers Anticipated Outcome(s): min assist OT Recommendation Patient destination: Home Follow Up Recommendations: Home health OT;24 hour supervision/assistance Equipment Recommended: Tub/shower bench   Skilled Therapeutic Intervention OT eval initiated, ADL assessment conducted at bed level and EOB due to elevated BP.  Pt with increased blinking when seated EOB and with questioning reports dizziness in unsupported sitting, however pt inconsistent with yes/no.  Pt's son present and able to provide additional info regarding PLOF and home setup.  Pt flaccid RUE with no awareness of deficit or decreased sensation.  Engaged in bed mobility with rolling Rt and Lt to engage in perineal hygiene and changing gown due to incontinent of bladder.  Pt able to follow verbal and gesture cues with bed mobility.  Eval limited due to elevated BP and inability to participate in OOB activity at this time.  OT  Evaluation Precautions/Restrictions  Precautions Precautions: Fall Precaution Comments: right hemiperesis, NPO and maintain HOB >30 deg; MONITOR VITALS; Alert RN for BP >180/>105 and hold activity for BP 220/120, monitor for signs of aspiration or abdominal pain and distention Restrictions Weight Bearing Restrictions: No General Amount of Missed OT Time (min): 10 Minutes Missed Time Reason: Other (comment) (elevated BP) Vital Signs Therapy Vitals Temp: 98.2 F (36.8 C) Temp src: Axillary Pulse Rate: 108 BP: 160/100 mmHg Patient Position, if appropriate: Lying Oxygen Therapy SpO2: 96 % O2 Device: None (Room air) Pain Pain Assessment Pain Assessment: Faces Faces Pain Scale: Hurts a little bit Pain Type: Acute pain Pain Location: Abdomen Pain Orientation: Mid Pain Onset: Unable to tell Pain Intervention(s): RN made aware Home Living/Prior Functioning Home Living Living Arrangements: Children Available Help at Discharge: Family;Available PRN/intermittently (son drives trucks may be able to take some time) Type of Home: House Home Access: Stairs to enter [employer] of Steps: 3-4 steps Entrance Stairs-Rails:  (unknown) Home Layout: One level Additional Comments: Son present to answer some questions as pt inaccurate with use/no responses  Lives With: Daughter (dtr in school, can assist most evenings) IADL History Homemaking Responsibilities: Yes Meal Prep Responsibility: Primary Laundry Responsibility: Primary Cleaning Responsibility: Primary Bill Paying/Finance Responsibility: Primary Shopping Responsibility: Primary Current License: Yes Occupation: Full time employment Type of Occupation: clerical work Leisure and Hobbies: crossword puzzles, tv Prior Function Level of Independence: Independent with homemaking with ambulation;Independent with gait;Independent with transfers;Independent with basic ADLs  Able to Take Stairs?: Yes Driving: Yes Vocation: Full  time employment ADL  See FIM Vision/Perception  Vision - History Baseline Vision: Wears glasses only for reading Vision - Assessment Eye Alignment: Within Functional Limits Vision Assessment: Vision tested Ocular Range of Motion: Within Functional Limits Tracking/Visual Pursuits: Impaired - to be further tested in functional context (decreased smoothness in all directions, multiple blinks during assessment) Saccades: Within functional limits Additional Comments: decreased eye contact but able to turn head and look in various directions with cues and increased time Perception Perception: Impaired Inattention/Neglect: Does not attend to right side of body;Impaired-to be further tested in functional context Praxis  Praxis: Impaired Praxis Impairment Details: Initiation;Motor planning Praxis-Other Comments: Impaired initiation and motor planning vs. receptive aphasia and delayed processing??  Cognition Overall Cognitive Status: Impaired/Different from baseline Arousal/Alertness: Awake/alert Orientation Level: Other (comment) Attention: Sustained;Selective Sustained Attention: Impaired Sustained Attention Impairment: Functional basic Selective Attention: Impaired Selective Attention Impairment: Functional basic Problem Solving: Impaired Problem Solving Impairment: Functional basic Executive Function: Sequencing;Initiating Sequencing: Impaired Sequencing Impairment: Functional basic Initiating: Impaired Initiating Impairment: Functional basic Sensation Sensation Light Touch: Impaired by gross assessment Stereognosis: Not tested Hot/Cold: Not tested Proprioception: Impaired by gross assessment Coordination Gross Motor Movements are Fluid and Coordinated: No Fine Motor Movements are Fluid and Coordinated: No Coordination and Movement Description: impaired by hemiplegia; pt appears to have active movement and strength in RLE but impaired initiation and motor planning; flaccid  RUE Motor  Motor Motor: Hemiplegia;Abnormal tone;Motor apraxia;Abnormal postural alignment and control Motor - Skilled Clinical Observations: R hemiplegia, hypotonia RUE, impaired motor planning and sequencing, in sitting presents with increased muscle activity in LUE and LLE to stabilize; slight pushing to R Mobility  Bed Mobility Bed Mobility: Supine to Sit;Sit to Supine;Rolling Right;Rolling Left Rolling Right: 1: +2 Total assist;With rail Rolling Right Details (indicate cue type and reason): With +2 A to maintain LE and feet in hooklying position to assess use and activation of RLE during rolling; total verbal, tactile and visual cues to sequence rolling to replace soiled pad under pt. Rolling Left: 1: +2 Total assist;With rail Rolling Left Details (indicate cue type and reason): With +2 A with activation of RLE noted during rolling to L; total verbal, tactile and visual cues to sequence rolling to replace soiled pad under pt. Supine to Sit: 2: Max assist;HOB elevated Supine to Sit Details (indicate cue type and reason): When cued to sit EOB pt able to initiate supine > sit with extra time for processing; pt did perform 25% of transfer to EOB but required assistance to advance RLE to EOB and lifting assistance to bring trunk to upright position EOB Sit to Supine: 1: +2 Total assist Sit to Supine - Details (indicate cue type and reason): +2 A to returned to supine with HOB elevated 30 deg with assistance to bring RLE into bed and for repositioning to [facility]  Trunk/Postural Assessment  Cervical Assessment Cervical Assessment: Exceptions to [facility] (keeps head/neck in R rotation and flexion; limited L rotation and extension) Thoracic Assessment Thoracic Assessment: Within Functional Limits Lumbar Assessment Lumbar Assessment: Within Functional Limits Postural Control Postural Control: Deficits on evaluation (increased activation on L resulting in mild pushing to R and posterior in sitting)   Balance Static Sitting Balance Static Sitting - Balance Support: Right upper extremity supported;Left upper extremity supported;Feet supported Static Sitting - Level of Assistance: 4: Min assist Dynamic Sitting Balance Dynamic Sitting - Balance Support: No upper extremity supported;Feet supported;Left upper extremity supported Dynamic Sitting - Level of Assistance: 3: Mod assist Dynamic Sitting Balance - Compensations: Pt with increased use of LUE for stabilization; attempted to distract LUE by having it engage in functional activities including wiping mouth with washcloth and doffing soiled gown and donning clean gown; also attempted to distract LLE by having it kick at a target to assess activation of RLE in sitting for stability; required mod A  overall to prevent posterior and R LOB Extremity/Trunk Assessment RUE Assessment RUE Assessment:  (flaccid, no pain with PROM) LUE Assessment LUE Assessment:  (loose gross grasp, strength grossly 3-4/5)  FIM:  FIM - Bed/Chair Transfer Bed/Chair Transfer: 2: Supine > Sit: Max  A (lifting assist/Pt. 25-49%);1: Two helpers (did not perform bed <> chair secondary to elevated BP)   Refer to Care Plan for Long Term Goals  Recommendations for other services: None  Discharge Criteria: Patient will be discharged from OT if patient refuses treatment 3 consecutive times without medical reason, if treatment goals not met, if there is a change in medical status, if patient makes no progress towards goals or if patient is discharged from hospital.  The above assessment, treatment plan, treatment alternatives and goals were discussed and mutually agreed upon: by patient and by family  [place] 06/05/2013, 12:13 PM

## 2013-06-05 NOTE — Progress Notes (Signed)
Social Work Assessment and Plan Social Work Assessment and Plan  Patient Details  Name: [name] MRN: [id number] Date of Birth: [birth year]  Today's Date: 06/05/2013  Problem List:  Patient Active Problem List   Diagnosis Date Noted  . CVA (cerebral infarction) 06/04/2013  . Dysphagia 06/02/2013  . Benign polycythemia 06/02/2013  . Malignant hypertension 05/28/2013  . DM (diabetes mellitus) type II controlled, neurological manifestation 05/27/2013  . Acute embolic stroke 52/84/1324  . 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 Hypertensive emergency 05/27/2013  . Stroke 05/27/2013  . Aphasia complicating stroke 40/12/2723   Past Medical History:  Past Medical History  Diagnosis Date  . Hypertension   . Depression   . Diabetes mellitus     ?type (05/29/2013)  . Bipolar affective disorder   . Schizophrenia     [name] 11/28/2007 (05/29/2013)  . Stroke 05/27/2013   Past Surgical History:  Past Surgical History  Procedure Laterality Date  . Cesarean section  ~ 1987  . Tubal ligation     Social History:  reports that she has never smoked. She has never used smokeless tobacco. She reports that she does not drink alcohol or use illicit drugs.  Family / Support Systems Marital Status: Single Patient Roles: Parent;Other (Comment) (Employee) Children: [name]-daughter  [phone]    Miguel Dibble  [phone] Other Supports: Donivan Scull  [phone] Anticipated Caregiver: Family to pull together to provide care-daughter primary caregiver. Ability/Limitations of Caregiver: All work or go to school, will need to sit down and plan pt's care and times they can be there. Caregiver Availability: 24/7 Family Dynamics: Close knit family both children are involved and will assist her sister's are offering to help her children when pt comes home.  Son reports: " I hope they will follow through with this."  Still processing the stroke with Mom and how it progressed since  hospitalized, she got worse here.  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  Son may work on [employer] while pt here.   Abuse/Neglect Physical Abuse: Denies Verbal Abuse: Denies Sexual Abuse: Denies Exploitation of patient/patient's resources: Denies Self-Neglect: Denies  Emotional Status Pt's affect, behavior adn adjustment status: Son can explain his Mom's stroke and how it became worse while here in the hospital.  He states: I was here one day and she could move her side the next day she could not."  Family is still trying to comprehend pt's condition and her deficits.  He reports she took care of herself and was doing well. Recent Psychosocial Issues: Other health issues-not taking meds due to no insurance, father paided for medicines until he passed away 2 years ago Pyschiatric History: History of bipolar and schizophrenia not taking any meds for or seeing anyone-according to son.  He states; " I did not know she was this bad."  Pt unabvle to participate due to language deficits from stroke.  Will interview as she progresses and can participate Substance Abuse History: Tobacco-unsure if will quit smoking no other drugs according to son  Patient / Family Perceptions, Expectations & Goals Pt/Family understanding of illness & functional limitations: Son is able to explain Mom's condition and her  deficits.  He is trying to look for a job locally so he can help his sister when she comes home from the hospital.  He still is processing her decline and how she was better one day then  the next was so bad off. Premorbid pt/family roles/activities: Mom, Sister, Ship broker, [place] member, etc Anticipated changes in roles/activities/participation: resume upon discharge Pt/family expectations/goals: Son states: " I hope she does well here and can do for herself."  " I hope my aunts will help [place] when she comes home."  [employer]: None Premorbid Home Care/DME Agencies: None Transportation available at discharge: [employer] referrals recommended: Support group (specify) ([facility] SUpport group)  Discharge Planning Living Arrangements: [place]: Children;Other relatives;Friends/neighbors Type of Residence: Private residence [employer] Resources: Self-pay (Applied for [employer] 3/19) Financial Resources: Other (Comment) (Applying for SSD) Financial Screen Referred: Yes Living Expenses: Rent Money Management: Patient Does the patient have any problems obtaining your medications?: Yes (Describe) (No insurance-father had paided for medicines but passed away 2 years ago) Home Management: Patient and daughter Patient/Family Preliminary Plans: Return home with daughter who attends college [employer] and her son helping.  Pt's sister's have offered to assist.  Disucssed with son pt will require 24 hour physical care at discharge and his sister can not do the care on her own, will need whole family to assist.  Applying for medicaid and SSD but will not be approved when discharged form the hosptital, so will need to be aware of this and plan accordingly.  Social Work Anticipated Follow Up Needs: HH/OP;SNF;Support Group  Clinical Impression Supportive son who is willing to assist Mom at home, if pt doesn't improve greatly may be too much care for son and daughter.  Will need pt's sister's to assist them to make discharge successful. Medicaid and Disability applications pending will not be approved prior to pt's discharge, so will need to manage without  this.  Will work with them on realistic discharge and hope the pt makes good progress while here. Will assess pt when language progresses that she can participate in assessment.     Elease Hashimoto 06/05/2013, 2:50 PM

## 2013-06-05 NOTE — Progress Notes (Signed)
Occupational Therapy Session Note  Patient Details  Name: [name] MRN: [id number] Date of Birth: 1960/04/23  Today's Date: 06/05/2013 Time: 1300-1330 Time Calculation (min): 30 min  Short Term Goals: Week 1:  OT Short Term Goal 1 (Week 1): Pt will complete bathing with mod assist OT Short Term Goal 2 (Week 1): Pt will complete UB dressing with mod assist OT Short Term Goal 3 (Week 1): Pt will complete LB dressing with mod assist OT Short Term Goal 4 (Week 1): Pt will complete toilet transfer with mod assist OT Short Term Goal 5 (Week 1): Pt will position RUE prior to transfers with min verbal cues  Skilled Therapeutic Interventions/Progress Updates:  Engaged in toilet transfer as pt frequently incontinent and RN reporting needing urine sample.  Performed bed mobility with mod assist and cues for sequencing.  Stand pivot to [facility] with 2nd person for safety due to elevated BP.  Pt attempted to void with on success, required physical assist for sit > stand and perineal hygiene.  Stand pivot to recliner with mod-max assist with physical assistance to advance RLE to perform complete pivot.    Therapy Documentation Precautions:  Precautions Precautions: Fall Precaution Comments: right hemiperesis, NPO and maintain HOB >30 deg; MONITOR VITALS; Alert RN for BP >180/>105 and hold activity for BP 220/120, monitor for signs of aspiration or abdominal pain and distention Restrictions Weight Bearing Restrictions: No General: General Amount of Missed OT Time (min): 10 Minutes Vital Signs: Therapy Vitals Temp: 98.2 F (36.8 C) Temp src: Axillary Pulse Rate: 108 BP: 160/100 mmHg Patient Position, if appropriate: Lying Pain: Pain Assessment Pain Assessment: Faces Faces Pain Scale: Hurts a little bit  See FIM for current functional status  Therapy/Group: Individual Therapy  Simonne Come 06/05/2013, 2:28 PM

## 2013-06-05 NOTE — Progress Notes (Signed)
Patient up to chair with 1-2 assist today in recliner. Tube feed rate increased, tolerating well. Denies pain. BP gradually decreased from yesterday's reads at time of admission to unit on 06-04-13. New IV inserted by IV nurse today, right wrist. Old IV removed in left antecubital area.  Patient incontinent of urine multiple times. One time order to straight cath received to get urine sample. Patient continues to have vaginal discharge. Unable to get sample. Will re-attempt later to get sample. Tonita Cong , RN

## 2013-06-05 NOTE — Progress Notes (Signed)
Patient information reviewed and entered into eRehab system by Tytionna Cloyd, RN, CRRN, PPS Coordinator.  Information including medical coding and functional independence measure will be reviewed and updated through discharge.    

## 2013-06-05 NOTE — Evaluation (Signed)
Speech Language Pathology Assessment and Plan  Patient Details  Name: [name] MRN: [id number] Date of Birth: [birth year]  SLP Diagnosis: Apraxia;Aphasia;Cognitive Impairments;Dysphagia  Rehab Potential: Fair ELOS: 21-24 days   Today's Date: 06/05/2013 Time: 1335-1430 Time Calculation (min): 55 min  Problem List:  Patient Active Problem List   Diagnosis Date Noted  . CVA (cerebral infarction) 06/04/2013  . Dysphagia 06/02/2013  . Benign polycythemia 06/02/2013  . Malignant hypertension 05/28/2013  . DM (diabetes mellitus) type II controlled, neurological manifestation 05/27/2013  . Acute embolic stroke 28/36/6294  . 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 Hypertensive emergency 05/27/2013  . Stroke 05/27/2013  . Aphasia complicating stroke 76/54/6503   Past Medical History:  Past Medical History  Diagnosis Date  . Hypertension   . Depression   . Diabetes mellitus     ?type (05/29/2013)  . Bipolar affective disorder   . Schizophrenia     [name] 11/28/2007 (05/29/2013)  . Stroke 05/27/2013   Past Surgical History:  Past Surgical History  Procedure Laterality Date  . Cesarean section  ~ 1987  . Tubal ligation      Assessment / Plan / Recommendation Clinical Impression [name] is a 53 y.o. female with history of HTN, DM, bipolar disorder, who was admitted on 05/27/13 with right facial droop and aphasia. 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 2 D echo with EF 55-60%. Carotid dopplers without significant ICA stenosis. 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 Patient with progressive  neurologic worsening on 05/29/13 pm with RUE >RLE weakness. Started on hypertonic fluids and transferred to ICU for closer monitoring. Follow up CCT without significant changes. Patient with decrease in LOC with worsening of symptoms. EEG done showing frequent sharp activity in left hemisphere s/o focal nonspecific disturbance. Patient NPO due to tendency to hold POs and panda placed for nutritional support. BP remains labile but lethargy resolving. Rehab team recommending CIR and patient admitted 06/04/13.  Orders received; Bedside Swallow and Cognitive-Linguistic Evaluations completed.  Patient presents with severe global aphasia with 30% accuracy with yes/no questions and inability to identify objects from a field of 2.  Additionally, patient demonstrates characteristics consistent with severe oral and verbal apraxia impacting both communication and swallow abilities.  Given severity of dysphagia continuation of NPO is recommended.  Patient will benefit from skilled SLP services during CIR stay to address deficits as well as to maximize overall functional abilities prior to discharge.                SLP Assessment  Patient will need skilled Speech Lanaguage Pathology Services during CIR admission    Recommendations  Diet Recommendations: NPO Medication Administration: Via alternative means Patient destination: Home Follow up Recommendations: 24 hour supervision/assistance;Home Health SLP Equipment Recommended: None recommended by SLP    SLP Frequency 5 out of 7 days   SLP Treatment/Interventions Cognitive remediation/compensation;Cueing hierarchy;Dysphagia/aspiration precaution training;Environmental controls;Functional tasks;Internal/external aids;Multimodal communication approach;Patient/family education;Oral motor exercises;Speech/Language facilitation;Therapeutic Activities    Pain Pain Assessment Pain Assessment: Faces Faces Pain Scale: No hurt Multiple Pain Sites: No Prior  Functioning Cognitive/Linguistic Baseline: Within functional limits Type of Home: House  Lives With: Daughter (in  school) Available Help at Discharge: Family;Available PRN/intermittently Vocation: Full time employment  Short Term Goals: Week 1: SLP Short Term Goal 1 (Week 1): Patient will initiiate 5 swallows per session with Max multi-modal cues. SLP Short Term Goal 2 (Week 1): Patient will follow 1-step directions with Max multi-modal cues. SLP Short Term Goal 3 (Week 1): Patient will identify object from a field of 2 with Max mlulti-modal cues. SLP Short Term Goal 4 (Week 1): Patient will approximate speech sounds with Max assist multi-modal cues.  See FIM for current functional status Refer to Care Plan for Long Term Goals  Recommendations for other services: None  Discharge Criteria: Patient will be discharged from SLP if patient refuses treatment 3 consecutive times without medical reason, if treatment goals not met, if there is a change in medical status, if patient makes no progress towards goals or if patient is discharged from hospital.  The above assessment, treatment plan, treatment alternatives and goals were discussed and mutually agreed upon: by patient and by family  Carmelia Roller., [place]  [place] 06/05/2013, 5:42 PM

## 2013-06-06 ENCOUNTER — Inpatient Hospital Stay (HOSPITAL_COMMUNITY): Payer: Medicaid Other | Admitting: Speech Pathology

## 2013-06-06 ENCOUNTER — Inpatient Hospital Stay (HOSPITAL_COMMUNITY): Payer: Medicaid Other | Admitting: Occupational Therapy

## 2013-06-06 ENCOUNTER — Inpatient Hospital Stay (HOSPITAL_COMMUNITY): Payer: Medicaid Other | Admitting: Physical Therapy

## 2013-06-06 DIAGNOSIS — I6340 Cerebral infarction due to embolism of unspecified cerebral artery: Secondary | ICD-10-CM

## 2013-06-06 DIAGNOSIS — E119 Type 2 diabetes mellitus without complications: Secondary | ICD-10-CM

## 2013-06-06 DIAGNOSIS — I10 Essential (primary) hypertension: Secondary | ICD-10-CM

## 2013-06-06 DIAGNOSIS — F319 Bipolar disorder, unspecified: Secondary | ICD-10-CM

## 2013-06-06 LAB — GLUCOSE, CAPILLARY
GLUCOSE-CAPILLARY: 296 mg/dL — AB (ref 70–99)
Glucose-Capillary: 258 mg/dL — ABNORMAL HIGH (ref 70–99)
Glucose-Capillary: 282 mg/dL — ABNORMAL HIGH (ref 70–99)
Glucose-Capillary: 269 mg/dL — ABNORMAL HIGH (ref 70–99)
Glucose-Capillary: 276 mg/dL — ABNORMAL HIGH (ref 70–99)

## 2013-06-06 LAB — URINALYSIS, ROUTINE W REFLEX MICROSCOPIC
Bilirubin Urine: NEGATIVE
Glucose, UA: 500 mg/dL — AB
HGB URINE DIPSTICK: NEGATIVE
Ketones, ur: NEGATIVE mg/dL
Leukocytes, UA: NEGATIVE
NITRITE: NEGATIVE
Protein, ur: NEGATIVE mg/dL
SPECIFIC GRAVITY, URINE: 1.018 (ref 1.005–1.030)
UROBILINOGEN UA: 0.2 mg/dL (ref 0.0–1.0)
pH: 7 (ref 5.0–8.0)

## 2013-06-06 MED ORDER — METOPROLOL TARTRATE 25 MG/10 ML ORAL SUSPENSION
25.0000 mg | Freq: Two times a day (BID) | ORAL | Status: DC
Start: 2013-06-06 — End: 2013-06-07
  Administered 2013-06-06 (×2): 25 mg
  Filled 2013-06-06 (×5): qty 10

## 2013-06-06 MED ORDER — ASPIRIN 325 MG PO TABS
325.0000 mg | ORAL_TABLET | Freq: Every day | ORAL | Status: DC
  Administered 2013-06-06 – 2013-07-08 (×31): 325 mg
  Filled 2013-06-06 (×36): qty 1

## 2013-06-06 NOTE — Progress Notes (Signed)
NUTRITION FOLLOW UP  Intervention:   1. Enteral nutrition; Advance Glucerna 1.2 to 75 ml/hr x 20 hours daily. (Anticipate TF to be held 4 hours daily for participation in therapy.)  2. Continue free water flushes. Pt will receive 1800 kcal, 90g protein, and 2065 ml free water daily.   NUTRITION DIAGNOSIS:  Inadequate oral intake related to inability to eat, dysphagia as evidenced by SLP assessment.   Monitor:  1. Enteral nutrition; initiation with tolerance. Pt to meet >/=90% estimated needs with nutrition support.  2. Wt/wt change; monitor trends  Assessment:   53 y.o. female with history of HTN, DM, bipolar disorder, who was admitted on 05/27/13 with right facial droop and aphasia. 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   Pt transitioned to 20 hr feeds yesterday.  Pt is tolerating well.  Per MD note, pt may require PEG if unable to progress with SLP.  Per SLP note, pt with tendencies to hold POs as well as characteristics of severe apraxia impacting communication and swallow skills.   Height: Ht Readings from Last 1 Encounters:  05/27/13 5\' 6"  (1.676 m)    Weight Status:   Wt Readings from Last 1 Encounters:  06/06/13 158 lb 4.6 oz (71.8 kg)   Re-estimated needs:  Kcal: 1800-1980 Protein: 70-85g Fluid: >1.8 L/day  Skin: intact  Diet Order: NPO  No intake or output data in the 24 hours ending 06/06/13 1035  Last BM: PTA   Labs:   Recent Labs Lab 06/02/13 0247 06/03/13 0510 06/04/13 0330 06/05/13 0600  NA 138 141 141 141  K 4.0 4.0 4.0 4.4  CL 100 99 99 97  CO2 24 26 27 31   BUN 15 16 15 17   CREATININE 0.69 0.68 0.69 0.78  CALCIUM 9.3 9.7 9.8  10.3  MG 1.9 2.0 2.0  --   PHOS 3.9 4.4 4.5  --   GLUCOSE 274* 282* 250* 277*    CBG (last 3)   Recent Labs  06/05/13 2349 06/06/13 0407 06/06/13 0802  GLUCAP 350* 258* 296*    Scheduled Meds: . aspirin  325 mg Per Tube Daily  . enoxaparin (LOVENOX) injection  30 mg Subcutaneous Q24H  . free water  200 mL Per Tube TID WC & HS  . insulin aspart  0-15 Units Subcutaneous 6 times per day  . insulin glargine  10 Units Subcutaneous BID  . metoprolol tartrate  25 mg Per Tube BID  . nystatin  5 mL Oral QID  . simvastatin  20 mg Per Tube q1800    Continuous Infusions: . feeding supplement (GLUCERNA 1.2 CAL) 1,000 mL (06/06/13 0628)    Brynda Greathouse, MS RD LDN Clinical Inpatient Dietitian Pager: [phone] Weekend/After hours pager: [phone]

## 2013-06-06 NOTE — Progress Notes (Signed)
Inpatient Diabetes Program Recommendations  AACE/ADA: New Consensus Statement on Inpatient Glycemic Control (2013)  Target Ranges:  Prepandial:   less than 140 mg/dL      Peak postprandial:   less than 180 mg/dL (1-2 hours)      Critically ill patients:  140 - 180 mg/dL    Results for [name], [name] (MRN [id number]) as of 06/06/2013 12:24  Ref. Range 06/05/2013 00:09 06/05/2013 04:05 06/05/2013 09:34 06/05/2013 11:53 06/05/2013 16:39 06/05/2013 20:05  Glucose-Capillary Latest Range: 70-99 mg/dL 210 (H) 260 (H) 285 (H) 289 (H) 256 (H) 238 (H)    Results for [name], [name] (MRN [id number]) as of 06/06/2013 12:24  Ref. Range 06/05/2013 23:49 06/06/2013 04:07 06/06/2013 08:02 06/06/2013 11:48  Glucose-Capillary Latest Range: 70-99 mg/dL 350 (H) 258 (H) 296 (H) 282 (H)      Insulin - Basal: Noted Lantus was increased to 10 units BID on 06/04/13.   Insulin - Meal Coverage: Please consider ordering Novolog 4 units Q4H for tube feeding coverage (in addition to current SSI).   Will follow. Wyn Quaker RN, MSN, CDE Diabetes Coordinator Inpatient Diabetes Program Team Pager: [phone] (8a-10p)

## 2013-06-06 NOTE — Progress Notes (Signed)
Physical Therapy Session Note  Patient Details  Name: [name] MRN: [id number] Date of Birth: [birth year]  Today's Date: 06/06/2013 Time: 1003-1045 and 7564-3329 Time Calculation (min): 42 min and 44 min  Short Term Goals: Week 1:  PT Short Term Goal 1 (Week 1): Pt will perform bed mobility to L and R with mod A and 75% cues for initiation and sequencing PT Short Term Goal 2 (Week 1): Pt will perform bed <> w/c transfers with mod A and 75% cues for initiation and sequencing PT Short Term Goal 3 (Week 1): Pt will perform w/c mobility x 50' on unit with L hemi technique and mod A with 75% cues for sequencing PT Short Term Goal 4 (Week 1): Pt will perform gait x 25' with LRAD and mod A  PT Short Term Goal 5 (Week 1): Pt will negotiate 3 stairs with one rail and max A and 75% cues for sequencing  Skilled Therapeutic Interventions/Progress Updates:   Pt resting in recliner.  Pt with head nods denied pain or discomfort.  Pt BP assessed; RN notified of elevated BP but RN states that as long as BP is <220/120 pt is cleared for activity but to monitor closely.  Obtained a cushion and half lap tray for pt w/c.  Pt transferred recliner > w/c stand pivot with max A for sit > stand and max-total A for lateral weight shifting and total verbal cues for sequencing of stepping to w/c and assistance for controlled lowering to w/c.  Pt set up in w/c and transported to gym total A.  NT present for transfer training; attempted stand pivot w/c > mat but pt unable to weight shift to L in order to advance RLE so returned to sitting in w/c.  Performed multiple transfers with PT demonstrating first and verbalizing sequence to NT and NT return demonstrating x 2 reps squat pivot w/c <> mat to L and R with focus on safe hand placement, appropriate cues and allowing pt to initiate and perform 50% of transfer with extra time for processing.  After transfers pt BP re-assessed; secondary to elevated diastolic and pt coughing  up sputum and retaining secretions in mouth returned to room for suction and to return to resting in recliner; RN notified of elevated BP.  Pt transferred with NT (under supervision of PT) w/c > recliner squat pivot mod-max A.  Pt set up with suction, call bell and pillow under RUE in recliner.    PM session: Pt sleeping in bed but easily awakened and agreeable to therapy.  Performed supine > sit rolling to L side with mod-max A.  Transferred bed <> w/c squat pivot mod-max A with simple verbal cues and gestures for initiation and sequencing.  In gym assessed BP before and after gait training.  BP decreased after gait training: 170/120, HR: 121 before gait and 166/108, HR: 121 after gait.  Performed gait with +2 total A with L HHA and therapist under RUE with assistance needed for lateral weight shifting, advancement and placement of RLE and stabilization of RLE in stance; pt noted to initiate 5% of R hip flexion in swing.  Also performed stair negotiation up/down 3 short stairs with one rail and +2 total A with tactile cues for initiation of L advancement to first step and assistance to advance, place and stabilize RLE.  Transferred to w/c squat pivot mod A.  Returned to room and to bed with +2 A and positioned with all items within reach in  bed.  Pt tolerated well.    Therapy Documentation Precautions:  Precautions Precautions: Fall Precaution Comments: right hemiperesis, NPO and maintain HOB >30 deg; MONITOR VITALS; Alert RN for BP >180/>105 and hold activity for BP 220/120, monitor for signs of aspiration or abdominal pain and distention Restrictions Weight Bearing Restrictions: No Vital Signs: Therapy Vitals Temp: 98.6 F (37 C) Temp src: Axillary Pulse Rate: 112 Resp: 20 BP: 178/136 mmHg Patient Position, if appropriate: Sitting Oxygen Therapy SpO2: 95 % O2 Device: None (Room air) Pain: Pain Assessment Faces Pain Scale: No hurt  See FIM for current functional status  Therapy/Group:  Individual Therapy  Raylene Everts [facility] 06/06/2013, 11:57 AM

## 2013-06-06 NOTE — Progress Notes (Signed)
Occupational Therapy Session Note  Patient Details  Name: [name] MRN: [id number] Date of Birth: Oct 18, 1960  Today's Date: 06/06/2013 Time: 6384-6659 Time Calculation (min): 54 min  Short Term Goals: Week 1:  OT Short Term Goal 1 (Week 1): Pt will complete bathing with mod assist OT Short Term Goal 2 (Week 1): Pt will complete UB dressing with mod assist OT Short Term Goal 3 (Week 1): Pt will complete LB dressing with mod assist OT Short Term Goal 4 (Week 1): Pt will complete toilet transfer with mod assist OT Short Term Goal 5 (Week 1): Pt will position RUE prior to transfers with min verbal cues  Skilled Therapeutic Interventions/Progress Updates:    Engaged in ADL retraining with focus on bed mobility, sitting balance, and increased participation in self-care tasks.  Completed perineal hygiene at bed level with focus on rolling Rt and Lt, pt required physical assist with positioning RLE and RUE with rolling with pt with increased initiation with mobility.  Bathing and dressing completed seated at EOB, pt with 1 LOB to Rt in sitting requiring mod assist to regain.  Pt with no attempts to wash RUE and required cues to transition from washing LLE to RLE.  Pt with no c/o dizziness or lightheadedness during unsupported sitting at EOB.  Mod assist sit > stand with pt attempting to assist with pulling pants over hips on Lt side.  Performed stand pivot transfer to Lt to recliner with mod assist.    Therapy Documentation Precautions:  Precautions Precautions: Fall Precaution Comments: right hemiperesis, NPO and maintain HOB >30 deg; MONITOR VITALS; Alert RN for BP >180/>105 and hold activity for BP 220/120, monitor for signs of aspiration or abdominal pain and distention Restrictions Weight Bearing Restrictions: No General:   Vital Signs: Therapy Vitals Temp: 98.6 F (37 C) Temp src: Axillary Pulse Rate: 112 Resp: 20 BP: 178/136 mmHg Patient Position, if appropriate: Sitting  (in therapy) Oxygen Therapy SpO2: 96 % O2 Device: None (Room air) Pain:  Pt with no c/o pain  See FIM for current functional status  Therapy/Group: Individual Therapy  Simonne Come 06/06/2013, 11:44 AM

## 2013-06-06 NOTE — Progress Notes (Signed)
Speech Language Pathology Daily Session Note  Patient Details  Name: [name] MRN: [id number] Date of Birth: [birth year]  Today's Date: 06/06/2013 Time: 2725-3664 Time Calculation (min): 43 min  Short Term Goals: Week 1: SLP Short Term Goal 1 (Week 1): Patient will initiiate 5 swallows per session with Max multi-modal cues. SLP Short Term Goal 2 (Week 1): Patient will follow 1-step directions with Max multi-modal cues. SLP Short Term Goal 3 (Week 1): Patient will identify object from a field of 2 with Max mlulti-modal cues. SLP Short Term Goal 4 (Week 1): Patient will approximate speech sounds with Max assist multi-modal cues.  Skilled Therapeutic Interventions: Skilled treatment session focused on addressing cognitive-linguistic skills.  Patient followed 1-step directions with Total hand over hand assist with the exception of close your eyes which required increased wait time.  Patient answered yes/no questions via head nod with Max multi-modal cues for 60% accuracy.  SLP also facilitated bilabial approximations with CVC words with Max assist multi-modal cues with ability to fade tactile cues for labial opening.  Patient required Max multi-modal cues to demonstrate ability to use call bell.  Of note, patient with poor management of secretions and required frequent suctioning throughout session. Continue with current plan of care.      FIM:  Comprehension Comprehension Mode: Auditory Comprehension: 1-Understands basic less than 25% of the time/requires cueing 75% of the time Expression Expression Mode: Verbal Expression: 1-Expresses basis less than 25% of the time/requires cueing greater than 75% of the time. Social Interaction Social Interaction: 1-Interacts appropriately less than 25% of the time. May be withdrawn or combative. Problem Solving Problem Solving: 1-Solves basic less than 25% of the time - needs direction nearly all the time or does not effectively solve problems and  may need a restraint for safety Memory Memory: 1-Recognizes or recalls less than 25% of the time/requires cueing greater than 75% of the time FIM - Eating Eating Activity: 1: Helper performs IV, parenteral, or tube feeding  Pain Pain Assessment Pain Assessment: No/denies pain  Therapy/Group: Individual Therapy  Carmelia Roller., CCC-SLP [phone]  [place] 06/06/2013, 4:21 PM

## 2013-06-06 NOTE — Progress Notes (Signed)
Subjective/Complaints: 53 y.o. female with history of HTN, DM, bipolar disorder, who was admitted on 05/27/13 with right facial droop and aphasia. 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 2 D echo with EF 55-60%. Carotid dopplers without significant ICA stenosis. 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 Patient with progressive neurologic worsening on 05/29/13 pm with RUE >RLE weakness. Started on hypertonic fluids and transferred to ICU for closer monitoring. Follow up CCT without significant changes. Patient with decrease in LOC with worsening of symptoms  No issues overnite, remains with severe aphasia  Review of Systems - Negative except aphasic not able to consistently do Y/N Objective: Vital Signs: Blood pressure 159/92, pulse 133, temperature 98.7 F (37.1 C), temperature source Oral, resp. rate 19, weight 71.8 kg (158 lb 4.6 oz), SpO2 92.00%. No results found. Results for orders placed during the hospital encounter of 06/04/13 (from the past 72 hour(s))  GLUCOSE, CAPILLARY     Status: Abnormal   Collection Time    06/04/13  4:47 PM      Result Value Ref Range   Glucose-Capillary 263 (*) 70 - 99 mg/dL   Comment 1 Notify RN    GLUCOSE, CAPILLARY     Status: Abnormal   Collection Time    06/04/13  7:58 PM      Result Value Ref Range   Glucose-Capillary 271 (*) 70 - 99 mg/dL  GLUCOSE, CAPILLARY     Status: Abnormal   Collection Time    06/05/13 12:09 AM      Result Value Ref Range   Glucose-Capillary 210 (*) 70 - 99 mg/dL   Comment 1 Notify RN    GLUCOSE, CAPILLARY     Status: Abnormal   Collection Time     06/05/13  4:05 AM      Result Value Ref Range   Glucose-Capillary 260 (*) 70 - 99 mg/dL  CBC WITH DIFFERENTIAL     Status: None   Collection Time    06/05/13  6:00 AM      Result Value Ref Range   WBC 9.5  4.0 - 10.5 K/uL   RBC 4.66  3.87 - 5.11 MIL/uL   Hemoglobin 14.4  12.0 - 15.0 g/dL   HCT 41.7  36.0 - 46.0 %   MCV 89.5  78.0 - 100.0 fL   MCH 30.9  26.0 - 34.0 pg   MCHC 34.5  30.0 - 36.0 g/dL   RDW 12.1  11.5 - 15.5 %   Platelets 285  150 - 400 K/uL   Neutrophils Relative % 59  43 - 77 %   Neutro Abs 5.6  1.7 - 7.7 K/uL   Lymphocytes Relative 29  12 - 46 %   Lymphs Abs 2.7  0.7 - 4.0 K/uL   Monocytes Relative 9  3 - 12 %   Monocytes Absolute 0.9  0.1 - 1.0 K/uL   Eosinophils Relative 3  0 - 5 %   Eosinophils Absolute 0.3  0.0 - 0.7 K/uL   Basophils Relative 0  0 - 1 %   Basophils Absolute 0.0  0.0 - 0.1 K/uL  COMPREHENSIVE METABOLIC PANEL     Status:  Abnormal   Collection Time    06/05/13  6:00 AM      Result Value Ref Range   Sodium 141  137 - 147 mEq/L   Potassium 4.4  3.7 - 5.3 mEq/L   Chloride 97  96 - 112 mEq/L   CO2 31  19 - 32 mEq/L   Glucose, Bld 277 (*) 70 - 99 mg/dL   BUN 17  6 - 23 mg/dL   Creatinine, Ser 0.78  0.50 - 1.10 mg/dL   Calcium 10.3  8.4 - 10.5 mg/dL   Total Protein 7.6  6.0 - 8.3 g/dL   Albumin 3.2 (*) 3.5 - 5.2 g/dL   AST 21  0 - 37 U/L   ALT 12  0 - 35 U/L   Alkaline Phosphatase 129 (*) 39 - 117 U/L   Total Bilirubin <0.2 (*) 0.3 - 1.2 mg/dL   GFR calc non Af Amer >90  >90 mL/min   GFR calc Af Amer >90  >90 mL/min   Comment: (NOTE)     The eGFR has been calculated using the CKD EPI equation.     This calculation has not been validated in all clinical situations.     eGFR's persistently <90 mL/min signify possible Chronic Kidney     Disease.  GLUCOSE, CAPILLARY     Status: Abnormal   Collection Time    06/05/13  9:34 AM      Result Value Ref Range   Glucose-Capillary 285 (*) 70 - 99 mg/dL  GLUCOSE, CAPILLARY     Status: Abnormal    Collection Time    06/05/13 11:53 AM      Result Value Ref Range   Glucose-Capillary 289 (*) 70 - 99 mg/dL   Comment 1 Notify RN    GLUCOSE, CAPILLARY     Status: Abnormal   Collection Time    06/05/13  4:39 PM      Result Value Ref Range   Glucose-Capillary 256 (*) 70 - 99 mg/dL  GLUCOSE, CAPILLARY     Status: Abnormal   Collection Time    06/05/13  8:05 PM      Result Value Ref Range   Glucose-Capillary 238 (*) 70 - 99 mg/dL  GLUCOSE, CAPILLARY     Status: Abnormal   Collection Time    06/05/13 11:49 PM      Result Value Ref Range   Glucose-Capillary 350 (*) 70 - 99 mg/dL   Comment 1 Notify RN    URINALYSIS, ROUTINE W REFLEX MICROSCOPIC     Status: Abnormal   Collection Time    06/06/13  1:24 AM      Result Value Ref Range   Color, Urine YELLOW  YELLOW   APPearance CLEAR  CLEAR   Specific Gravity, Urine 1.018  1.005 - 1.030   pH 7.0  5.0 - 8.0   Glucose, UA 500 (*) NEGATIVE mg/dL   Hgb urine dipstick NEGATIVE  NEGATIVE   Bilirubin Urine NEGATIVE  NEGATIVE   Ketones, ur NEGATIVE  NEGATIVE mg/dL   Protein, ur NEGATIVE  NEGATIVE mg/dL   Urobilinogen, UA 0.2  0.0 - 1.0 mg/dL   Nitrite NEGATIVE  NEGATIVE   Leukocytes, UA NEGATIVE  NEGATIVE   Comment: MICROSCOPIC NOT DONE ON URINES WITH NEGATIVE PROTEIN, BLOOD, LEUKOCYTES, NITRITE, OR GLUCOSE <1000 mg/dL.  GLUCOSE, CAPILLARY     Status: Abnormal   Collection Time    06/06/13  4:07 AM      Result Value Ref Range  Glucose-Capillary 258 (*) 70 - 99 mg/dL   Comment 1 Notify RN       HEENT: normal and NG tube left nares, no drainage Cardio: RRR and no murmur Resp: CTA B/L and unlabored GI: BS positive and non distended Extremity:  Pulses positive and No Edema Skin:   Intact Neuro: Lethargic, Flat, Cranial Nerve Abnormalities R central 7, Abnormal Sensory withdraws to pinch in RUE adn RLE, Abnormal Motor 0/5 RUE and RLE, normal on Left and Aphasic Musc/Skel:  Other no pain with R shoulder ROM Gen  NAD   Assessment/Plan: 1. Functional deficits secondary to Left MCA infarct which require 3+ hours per day of interdisciplinary therapy in a comprehensive inpatient rehab setting. Physiatrist is providing close team supervision and 24 hour management of active medical problems listed below. Physiatrist and rehab team continue to assess barriers to discharge/monitor patient progress toward functional and medical goals. FIM: FIM - Bathing Bathing: 1: Total-Patient completes 0-2 of 10 parts or less than 25%  FIM - Upper Body Dressing/Undressing Upper body dressing/undressing: 0: Wears gown/pajamas-no public clothing FIM - Lower Body Dressing/Undressing Lower body dressing/undressing: 0: Wears Interior and spatial designer  FIM - Toileting Toileting: 1: Total-Patient completed zero steps, helper did all 3  FIM - Radio producer Devices: Recruitment consultant Transfers: 3-To toilet/BSC: Mod A (lift or lower assist);2-From toilet/BSC: Max A (lift and lower assist)  FIM - Bed/Chair Transfer Bed/Chair Transfer: 3: Supine > Sit: Mod A (lifting assist/Pt. 50-74%/lift 2 legs;2: Bed > Chair or W/C: Max A (lift and lower assist)  FIM - Locomotion: Wheelchair Locomotion: Wheelchair: 0: Activity did not occur (no OOB activity secondary to elevated BP) FIM - Locomotion: Ambulation Locomotion: Ambulation: 0: Activity did not occur (secondary to elevated BP)  Comprehension Comprehension Mode: Auditory Comprehension: 1-Understands basic less than 25% of the time/requires cueing 75% of the time  Expression Expression Mode: Nonverbal Expression: 1-Expresses basis less than 25% of the time/requires cueing greater than 75% of the time.  Social Interaction Social Interaction: 1-Interacts appropriately less than 25% of the time. May be withdrawn or combative.  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 DVT Prophylaxis/Anticoagulation: Pharmaceutical: Lovenox  2. Pain Management: N/A  3. Bipolar disorder/Mood: No signs of distress reported but patient with poor awareness of deficits. Monitor mood for now. LCSW to follow with patient and family.  4. Neuropsych: This patient is not capable of making decisions on her own behalf.  5. DM type 2: Will monitor with bid checks. Will increase lantus to bid--anticipate higher BS with tube feeds. Will titrate as needed. Use SSI for elevated BS.  6. HTN: Will monitor with bid checks. Avoid hypotension with permissive high BP 180-200 range to prevent hypoperfusion/neuro worsening  7. H/o paranoid schizophrenia: Has been to [facility] in the past due to decompensation. Not on medications as this time--has been on respirdal in the past and has history of non-compliance per records  8. Dysphagia: NPO with panda TF. Likely will need G tube given presentation.  9. Resting Tachycardia: Check baseline EKG--HR 110-120 range at rest. Check HR with activity level. start low dose BB for heart  rate control if HR increases further with activity. Add water flushes to avoid dehydration.   LOS (Days) 2 A FACE TO FACE EVALUATION WAS PERFORMED  KIRSTEINS,ANDREW E 06/06/2013, 7:21 AM

## 2013-06-06 NOTE — IPOC Note (Signed)
Overall Plan of Care [facility]) Patient Details Name: [name] MRN: [id number] DOB: [birth year]  Admitting Diagnosis: L MCA CVA  Hospital Problems: Active Problems:   CVA (cerebral infarction)     Functional Problem List: Nursing Bladder;Bowel;Nutrition;Sensory;Motor  PT Balance;Endurance;Motor;Pain;Perception;Safety;Sensory  OT Balance;Endurance;Motor;Perception;Safety;Sensory  SLP Cognition;Linguistic;Nutrition;Safety  TR         Basic ADL's: OT Grooming;Bathing;Dressing;Toileting     Advanced  ADL's: OT       Transfers: PT Bed Mobility;Bed to Chair;Car;Furniture  OT Toilet;Tub/Shower     Locomotion: PT Ambulation;Wheelchair Mobility;Stairs     Additional Impairments: OT Fuctional Use of Upper Extremity  SLP Swallowing;Communication;Social Cognition comprehension;expression Social Interaction;Problem Solving;Memory;Attention;Awareness  TR      Anticipated Outcomes Item Anticipated Outcome  Self Feeding    Swallowing  Mod assist    Basic self-care  min assist  Toileting  min assist   Bathroom Transfers min assist  Bowel/Bladder  Patient will indicate need for bowel/bladder elimination with regular toileting offered/assessments  Transfers  Supervision  Locomotion  Min A  Communication  Mod assist   Cognition  Mod assist   Pain  Patient will have pain at a level of 2 or less and indicate using faces scale   Safety/Judgment  Patient will use call light and ask for assistance as needed    Therapy Plan: PT Intensity: Minimum of 1-2 x/day ,45 to 90 minutes PT Frequency: 5 out of 7 days PT Duration Estimated Length of Stay: 21-28 days OT Intensity: Minimum of 1-2 x/day, 45 to 90 minutes OT Frequency: 5 out of 7 days OT Duration/Estimated Length of Stay: 21-24 days SLP Intensity: Minumum of 1-2 x/day, 30 to 90 minutes SLP Frequency: 5 out of 7 days SLP Duration/Estimated Length of Stay: 21-24 days       Team Interventions: Nursing  Interventions Patient/Family Education  PT interventions Ambulation/gait training;Balance/vestibular training;Cognitive remediation/compensation;Discharge planning;Disease management/prevention;DME/adaptive equipment instruction;Functional mobility training;Neuromuscular re-education;Pain management;Patient/family education;Psychosocial support;Splinting/orthotics;Stair training;Therapeutic Activities;Therapeutic Exercise;UE/LE Strength taining/ROM;UE/LE Coordination activities;Visual/perceptual remediation/compensation;Wheelchair propulsion/positioning  OT Interventions Balance/vestibular training;Cognitive remediation/compensation;Discharge planning;DME/adaptive equipment instruction;Functional mobility training;Neuromuscular re-education;Pain management;Patient/family education;Psychosocial support;Self Care/advanced ADL retraining;Therapeutic Activities;Splinting/orthotics;Therapeutic Exercise;UE/LE Strength taining/ROM;UE/LE Coordination activities;Visual/perceptual remediation/compensation  SLP Interventions Cognitive remediation/compensation;Cueing hierarchy;Dysphagia/aspiration precaution training;Environmental controls;Functional tasks;Internal/external aids;Multimodal communication approach;Patient/family education;Oral motor exercises;Speech/Language facilitation;Therapeutic Activities  TR Interventions    SW/CM Interventions Discharge Planning;Psychosocial Support;Patient/Family Education    Team Discharge Planning: Destination: PT-Home ,OT- Home , SLP-Home Projected Follow-up: PT-Home health PT;24 hour supervision/assistance, OT-  Home health OT;24 hour supervision/assistance, SLP-24 hour supervision/assistance;Home Health SLP Projected Equipment Needs: PT-To be determined, OT- Tub/shower bench, SLP-None recommended by SLP Equipment Details: PT- , OT-  Patient/family involved in discharge planning: PT- Patient,  OT-Patient;Family member/caregiver, SLP-Patient;Family member/caregiver  MD  ELOS: 16-24d Medical Rehab Prognosis:  Good Assessment: 53 y.o. female with history of HTN, DM, bipolar disorder, who was admitted on 05/27/13 with right facial droop and aphasia. 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 2 D echo with EF 55-60%. Carotid dopplers without significant ICA stenosis. 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    Now requiring 24/7 Rehab RN,MD, as well as CIR level PT, OT and SLP.  Treatment team will focus on ADLs and mobility with  goals set at min A   See Team Conference Notes for weekly updates to the plan of care

## 2013-06-06 NOTE — Plan of Care (Signed)
Problem: RH BLADDER ELIMINATION Goal: RH STG MANAGE BLADDER WITH ASSISTANCE STG Manage Bladder With Min Assistance  Outcome: Not Progressing Total assist

## 2013-06-07 ENCOUNTER — Inpatient Hospital Stay (HOSPITAL_COMMUNITY): Payer: Medicaid Other | Admitting: Speech Pathology

## 2013-06-07 ENCOUNTER — Inpatient Hospital Stay (HOSPITAL_COMMUNITY): Payer: Medicaid Other | Admitting: Physical Therapy

## 2013-06-07 ENCOUNTER — Inpatient Hospital Stay (HOSPITAL_COMMUNITY): Payer: Medicaid Other | Admitting: Occupational Therapy

## 2013-06-07 ENCOUNTER — Inpatient Hospital Stay (HOSPITAL_COMMUNITY): Payer: Self-pay

## 2013-06-07 DIAGNOSIS — F319 Bipolar disorder, unspecified: Secondary | ICD-10-CM

## 2013-06-07 DIAGNOSIS — E119 Type 2 diabetes mellitus without complications: Secondary | ICD-10-CM

## 2013-06-07 DIAGNOSIS — I6340 Cerebral infarction due to embolism of unspecified cerebral artery: Secondary | ICD-10-CM

## 2013-06-07 DIAGNOSIS — I10 Essential (primary) hypertension: Secondary | ICD-10-CM

## 2013-06-07 LAB — GLUCOSE, CAPILLARY
GLUCOSE-CAPILLARY: 287 mg/dL — AB (ref 70–99)
Glucose-Capillary: 239 mg/dL — ABNORMAL HIGH (ref 70–99)
Glucose-Capillary: 245 mg/dL — ABNORMAL HIGH (ref 70–99)
Glucose-Capillary: 246 mg/dL — ABNORMAL HIGH (ref 70–99)
Glucose-Capillary: 246 mg/dL — ABNORMAL HIGH (ref 70–99)
Glucose-Capillary: 291 mg/dL — ABNORMAL HIGH (ref 70–99)

## 2013-06-07 LAB — URINE CULTURE
COLONY COUNT: NO GROWTH
Culture: NO GROWTH

## 2013-06-07 MED ORDER — METOPROLOL TARTRATE 25 MG/10 ML ORAL SUSPENSION
50.0000 mg | Freq: Two times a day (BID) | ORAL | Status: DC
  Administered 2013-06-07: 50 mg
  Filled 2013-06-07 (×4): qty 20

## 2013-06-07 MED ORDER — INSULIN GLARGINE 100 UNIT/ML ~~LOC~~ SOLN
15.0000 [IU] | Freq: Two times a day (BID) | SUBCUTANEOUS | Status: DC
  Administered 2013-06-07 – 2013-06-08 (×3): 15 [IU] via SUBCUTANEOUS
  Filled 2013-06-07 (×4): qty 0.15

## 2013-06-07 NOTE — Progress Notes (Signed)
Occupational Therapy Session Note  Patient Details  Name: [name] MRN: [id number] Date of Birth: [birth year]  Today's Date: 06/07/2013 Time: 1020-1045 Time Calculation (min): 25 min  Short Term Goals: Week 1:  OT Short Term Goal 1 (Week 1): Pt will complete bathing with mod assist OT Short Term Goal 2 (Week 1): Pt will complete UB dressing with mod assist OT Short Term Goal 3 (Week 1): Pt will complete LB dressing with mod assist OT Short Term Goal 4 (Week 1): Pt will complete toilet transfer with mod assist OT Short Term Goal 5 (Week 1): Pt will position RUE prior to transfers with min verbal cues  Skilled Therapeutic Interventions/Progress Updates:    Engaged in modified ADL retraining session secondary to elevated BP.  BP 182/118 at rest, with RN recommending seated level activity.  Engaged in grooming task and UB dressing with education on swallowing and attention to Rt face to wipe secretions.  Pt required cues to attend to Rt face to wipe mouth with decreased awareness of oral secretions.  UB bathing and donned shirt and clean gown seated in recliner with pt with decreased attention to RUE with bathing and when therapist assisting with threading Rt sleeve.  Therapy Documentation Precautions:  Precautions Precautions: Fall Precaution Comments: right hemiperesis, NPO and maintain HOB >30 deg; MONITOR VITALS; Alert RN for BP >180/>105 and hold activity for BP 220/120, monitor for signs of aspiration or abdominal pain and distention Restrictions Weight Bearing Restrictions: No General: General Amount of Missed OT Time (min): 20 Minutes Missed Time Reason: Other (comment)  Vital Signs: Therapy Vitals Pulse Rate: 90 BP: 190/110 mmHg (manual) Patient Position, if appropriate: Lying Pain: Pain Assessment Pain Assessment: No/denies pain Pain Score: 0-No pain  See FIM for current functional status  Therapy/Group: Individual Therapy  Simonne Come 06/07/2013, 10:54  AM

## 2013-06-07 NOTE — Progress Notes (Signed)
Physical Therapy Session Note  Patient Details  Name: [name] MRN: [id number] Date of Birth: 02/03/1961  Today's Date: 06/07/2013 Time: 0800-0835 Time Calculation (min): 35 min  Short Term Goals: Week 1:  PT Short Term Goal 1 (Week 1): Pt will perform bed mobility to L and R with mod A and 75% cues for initiation and sequencing PT Short Term Goal 2 (Week 1): Pt will perform bed <> w/c transfers with mod A and 75% cues for initiation and sequencing PT Short Term Goal 3 (Week 1): Pt will perform w/c mobility x 50' on unit with L hemi technique and mod A with 75% cues for sequencing PT Short Term Goal 4 (Week 1): Pt will perform gait x 25' with LRAD and mod A  PT Short Term Goal 5 (Week 1): Pt will negotiate 3 stairs with one rail and max A and 75% cues for sequencing  Skilled Therapeutic Interventions/Progress Updates:    Pt received sleeping, R sidelying in bed. Pt easily aroused and agreeable to PT with a head nod. Pt's BP assessed manually: 190/110, HR: 90 BPM. Therapy cleared through nsg. Pt completed supine to sit tsf with min A and verbal/tactile cues. Pt able come into long litting, coming mostly through the right side, demonstrating good core strength. Pt assisted with donning of gown and pt able to assist RUE through arm hole. Pt completed sit to stand and stand pivot transfers with max A and verbal/tactile cueing for weight shifting. Pt demonstrates difficulty with transferring to the right side and weight shifting to clear the RLE. Pt propelled wheel chair in hallway 55 feet using the LUE and intermittent use of LLE with min to mod A and cues for coordination/technique, also requiring cue to avoid obstacles in her path. BP assessed in hallway with nursing after completing transfers and wheel chair mobility and found to be: 193/139. Nsg held therapy at this time due to diastolic pressure being elevated greater than 120. Pt returned to room and assisted into recliner chair. All needs  met. Suction remains not working. Nsg is aware.   Therapy Documentation Precautions:  Precautions Precautions: Fall Precaution Comments: right hemiperesis, NPO and maintain HOB >30 deg; MONITOR VITALS; Alert RN for BP >180/>105 and hold activity for BP 220/120, monitor for signs of aspiration or abdominal pain and distention Restrictions Weight Bearing Restrictions: No General: Amount of Missed PT Time (min): 25 Minutes Missed Time Reason: Other (comment) (Pt BP elevated to 193/139. Diastolic BP to high to continue with therapy at this time. RN hold until medicaiton given. ) Vital Signs: Therapy Vitals Temp: 98.5 F (36.9 C) Temp src: Oral Pulse Rate: 90 Resp: 17 BP: 190/110 mmHg (manual) Patient Position, if appropriate: Lying Oxygen Therapy SpO2: 97 % Pain: Pain Assessment Pain Assessment: No/denies pain Pain Score: 0-No pain    See FIM for current functional status  Therapy/Group: Individual Therapy  ,  R 06/07/2013, 8:52 AM  

## 2013-06-07 NOTE — Progress Notes (Signed)
Subjective/Complaints: 53 y.o. female with history of HTN, DM, bipolar disorder, who was admitted on 05/27/13 with right facial droop and aphasia. 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 2 D echo with EF 55-60%. Carotid dopplers without significant ICA stenosis. 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 Patient with progressive neurologic worsening on 05/29/13 pm with RUE >RLE weakness. Started on hypertonic fluids and transferred to ICU for closer monitoring. Follow up CCT without significant changes. Patient with decrease in LOC with worsening of symptoms  No issues overnite, remains with severe aphasia, bp's high  Review of Systems - Negative except aphasic not able to consistently do Y/N Objective: Vital Signs: Blood pressure 194/120, pulse 115, temperature 98.5 F (36.9 C), temperature source Oral, resp. rate 17, weight 70.3 kg (154 lb 15.7 oz), SpO2 97.00%. No results found. Results for orders placed during the hospital encounter of 06/04/13 (from the past 72 hour(s))  GLUCOSE, CAPILLARY     Status: Abnormal   Collection Time    06/04/13  4:47 PM      Result Value Ref Range   Glucose-Capillary 263 (*) 70 - 99 mg/dL   Comment 1 Notify RN    GLUCOSE, CAPILLARY     Status: Abnormal   Collection Time    06/04/13  7:58 PM      Result Value Ref Range   Glucose-Capillary 271 (*) 70 - 99 mg/dL  GLUCOSE, CAPILLARY     Status: Abnormal   Collection Time    06/05/13 12:09 AM      Result Value Ref Range   Glucose-Capillary 210 (*) 70 - 99 mg/dL   Comment 1 Notify RN    GLUCOSE, CAPILLARY     Status: Abnormal   Collection Time    06/05/13  4:05 AM      Result Value Ref Range   Glucose-Capillary 260 (*) 70 - 99 mg/dL  CBC WITH DIFFERENTIAL     Status: None   Collection Time    06/05/13  6:00 AM      Result Value Ref Range   WBC 9.5  4.0 - 10.5 K/uL   RBC 4.66  3.87 - 5.11 MIL/uL   Hemoglobin 14.4  12.0 - 15.0 g/dL   HCT 41.7  36.0 - 46.0 %   MCV 89.5  78.0 - 100.0 fL   MCH 30.9  26.0 - 34.0 pg   MCHC 34.5  30.0 - 36.0 g/dL   RDW 12.1  11.5 - 15.5 %   Platelets 285  150 - 400 K/uL   Neutrophils Relative % 59  43 - 77 %   Neutro Abs 5.6  1.7 - 7.7 K/uL   Lymphocytes Relative 29  12 - 46 %   Lymphs Abs 2.7  0.7 - 4.0 K/uL   Monocytes Relative 9  3 - 12 %   Monocytes Absolute 0.9  0.1 - 1.0 K/uL   Eosinophils Relative 3  0 - 5 %   Eosinophils Absolute 0.3  0.0 - 0.7 K/uL   Basophils Relative 0  0 - 1 %   Basophils Absolute 0.0  0.0 - 0.1 K/uL  COMPREHENSIVE METABOLIC PANEL  Status: Abnormal   Collection Time    06/05/13  6:00 AM      Result Value Ref Range   Sodium 141  137 - 147 mEq/L   Potassium 4.4  3.7 - 5.3 mEq/L   Chloride 97  96 - 112 mEq/L   CO2 31  19 - 32 mEq/L   Glucose, Bld 277 (*) 70 - 99 mg/dL   BUN 17  6 - 23 mg/dL   Creatinine, Ser 0.78  0.50 - 1.10 mg/dL   Calcium 10.3  8.4 - 10.5 mg/dL   Total Protein 7.6  6.0 - 8.3 g/dL   Albumin 3.2 (*) 3.5 - 5.2 g/dL   AST 21  0 - 37 U/L   ALT 12  0 - 35 U/L   Alkaline Phosphatase 129 (*) 39 - 117 U/L   Total Bilirubin <0.2 (*) 0.3 - 1.2 mg/dL   GFR calc non Af Amer >90  >90 mL/min   GFR calc Af Amer >90  >90 mL/min   Comment: (NOTE)     The eGFR has been calculated using the CKD EPI equation.     This calculation has not been validated in all clinical situations.     eGFR's persistently <90 mL/min signify possible Chronic Kidney     Disease.  GLUCOSE, CAPILLARY     Status: Abnormal   Collection Time    06/05/13  9:34 AM      Result Value Ref Range   Glucose-Capillary 285 (*) 70 - 99 mg/dL  GLUCOSE, CAPILLARY      Status: Abnormal   Collection Time    06/05/13 11:53 AM      Result Value Ref Range   Glucose-Capillary 289 (*) 70 - 99 mg/dL   Comment 1 Notify RN    GLUCOSE, CAPILLARY     Status: Abnormal   Collection Time    06/05/13  4:39 PM      Result Value Ref Range   Glucose-Capillary 256 (*) 70 - 99 mg/dL  GLUCOSE, CAPILLARY     Status: Abnormal   Collection Time    06/05/13  8:05 PM      Result Value Ref Range   Glucose-Capillary 238 (*) 70 - 99 mg/dL  GLUCOSE, CAPILLARY     Status: Abnormal   Collection Time    06/05/13 11:49 PM      Result Value Ref Range   Glucose-Capillary 350 (*) 70 - 99 mg/dL   Comment 1 Notify RN    URINALYSIS, ROUTINE W REFLEX MICROSCOPIC     Status: Abnormal   Collection Time    06/06/13  1:24 AM      Result Value Ref Range   Color, Urine YELLOW  YELLOW   APPearance CLEAR  CLEAR   Specific Gravity, Urine 1.018  1.005 - 1.030   pH 7.0  5.0 - 8.0   Glucose, UA 500 (*) NEGATIVE mg/dL   Hgb urine dipstick NEGATIVE  NEGATIVE   Bilirubin Urine NEGATIVE  NEGATIVE   Ketones, ur NEGATIVE  NEGATIVE mg/dL   Protein, ur NEGATIVE  NEGATIVE mg/dL   Urobilinogen, UA 0.2  0.0 - 1.0 mg/dL   Nitrite NEGATIVE  NEGATIVE   Leukocytes, UA NEGATIVE  NEGATIVE   Comment: MICROSCOPIC NOT DONE ON URINES WITH NEGATIVE PROTEIN, BLOOD, LEUKOCYTES, NITRITE, OR GLUCOSE <1000 mg/dL.  GLUCOSE, CAPILLARY     Status: Abnormal   Collection Time    06/06/13  4:07 AM      Result Value Ref Range  Glucose-Capillary 258 (*) 70 - 99 mg/dL   Comment 1 Notify RN    GLUCOSE, CAPILLARY     Status: Abnormal   Collection Time    06/06/13  8:02 AM      Result Value Ref Range   Glucose-Capillary 296 (*) 70 - 99 mg/dL   Comment 1 Notify RN    GLUCOSE, CAPILLARY     Status: Abnormal   Collection Time    06/06/13 11:48 AM      Result Value Ref Range   Glucose-Capillary 282 (*) 70 - 99 mg/dL   Comment 1 Notify RN    GLUCOSE, CAPILLARY     Status: Abnormal   Collection Time    06/06/13  3:53  PM      Result Value Ref Range   Glucose-Capillary 269 (*) 70 - 99 mg/dL  GLUCOSE, CAPILLARY     Status: Abnormal   Collection Time    06/06/13  8:07 PM      Result Value Ref Range   Glucose-Capillary 276 (*) 70 - 99 mg/dL  GLUCOSE, CAPILLARY     Status: Abnormal   Collection Time    06/07/13 12:13 AM      Result Value Ref Range   Glucose-Capillary 239 (*) 70 - 99 mg/dL   Comment 1 Notify RN    GLUCOSE, CAPILLARY     Status: Abnormal   Collection Time    06/07/13  3:47 AM      Result Value Ref Range   Glucose-Capillary 291 (*) 70 - 99 mg/dL   Comment 1 Notify RN    GLUCOSE, CAPILLARY     Status: Abnormal   Collection Time    06/07/13  7:54 AM      Result Value Ref Range   Glucose-Capillary 246 (*) 70 - 99 mg/dL   Comment 1 Notify RN       HEENT: normal and NG tube left nares, no drainage or skin breakdown Cardio: RRR and no murmur Resp: CTA B/L and unlabored GI: BS positive and non distended, Extremity:  Pulses positive and No Edema Skin:   Intact Neuro: remains lethargic, Flat, Cranial Nerve Abnormalities R central 7, Abnormal Sensory withdraws to pinch in RUE adn RLE, Abnormal Motor 0/5 RUE and RLE, normal on Left and Aphasic Musc/Skel:  Other no pain with R shoulder ROM Gen NAD   Assessment/Plan: 1. Functional deficits secondary to Left MCA infarct which require 3+ hours per day of interdisciplinary therapy in a comprehensive inpatient rehab setting. Physiatrist is providing close team supervision and 24 hour management of active medical problems listed below. Physiatrist and rehab team continue to assess barriers to discharge/monitor patient progress toward functional and medical goals. FIM: FIM - Bathing Bathing Steps Patient Completed: Chest;Abdomen;Right upper leg;Left upper leg Bathing: 2: Max-Patient completes 3-4 3f10 parts or 25-49%  FIM - Upper Body Dressing/Undressing Upper body dressing/undressing steps patient completed: Thread/unthread left sleeve of  pullover shirt/dress;Put head through opening of pull over shirt/dress Upper body dressing/undressing: 2: Max-Patient completed 25-49% of tasks FIM - Lower Body Dressing/Undressing Lower body dressing/undressing: 1: Total-Patient completed less than 25% of tasks  FIM - Toileting Toileting: 1: Total-Patient completed zero steps, helper did all 3  FIM - TRadio producerDevices: Bedside commode Toilet Transfers: 3-To toilet/BSC: Mod A (lift or lower assist);2-From toilet/BSC: Max A (lift and lower assist)  FIM - BEngineer, siteAssistive Devices: Arm rests Bed/Chair Transfer: 3: Supine > Sit: Mod A (lifting assist/Pt.  50-74%/lift 2 legs;3: Sit > Supine: Mod A (lifting assist/Pt. 50-74%/lift 2 legs)  FIM - Locomotion: Wheelchair Locomotion: Wheelchair: 1: Total Assistance/staff pushes wheelchair (Pt<25%) FIM - Locomotion: Ambulation Ambulation/Gait Assistance: 1: +2 Total assist Locomotion: Ambulation: 1: Two helpers  Comprehension Comprehension Mode: Auditory Comprehension: 1-Understands basic less than 25% of the time/requires cueing 75% of the time  Expression Expression Mode: Verbal Expression: 1-Expresses basis less than 25% of the time/requires cueing greater than 75% of the time.  Social Interaction Social Interaction: 1-Interacts appropriately less than 25% of the time. May be withdrawn or combative.  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 DVT Prophylaxis/Anticoagulation: Pharmaceutical: Lovenox  2. Pain Management: N/A  3. Bipolar disorder/Mood: No signs of distress reported but patient  with poor awareness of deficits. Monitor mood for now. LCSW to follow with patient and family.  4. Neuropsych: This patient is not capable of making decisions on her own behalf.  5. DM type 2: Will monitor with bid checks. Continue to titrate insulin upwards. . Use SSI for elevated BS.  6. HTN: adjust for better control avoiding hypotension with permissive high BP 180-200 range to prevent hypoperfusion/neuro worsening   -increase metoprolol today 7. H/o paranoid schizophrenia: Has been to [facility] in the past due to decompensation. Not on medications as this time--has been on respirdal in the past and has history of non-compliance per records  8. Dysphagia: NPO with panda TF. Likely will need G tube given presentation.  9. Resting Tachycardia: C -HR 110-120 range at rest.   On water flushes to avoid dehydration.   -BB LOS (Days) 3 A FACE TO FACE EVALUATION WAS PERFORMED  Linas Stepter T 06/07/2013, 8:05 AM

## 2013-06-07 NOTE — Progress Notes (Signed)
Physical Therapy Note  Patient Details  Name: [name] MRN: [id number] Date of Birth: 1960/11/03 Today's Date: 06/07/2013  1300 Pt missed 45 minute PT session secondary to diastolic BP 263 (MD hold  > 120).   (185/123 in sitting).   Harvir Patry,JIM 06/07/2013, 1:20 PM

## 2013-06-08 LAB — GLUCOSE, CAPILLARY
GLUCOSE-CAPILLARY: 307 mg/dL — AB (ref 70–99)
GLUCOSE-CAPILLARY: 292 mg/dL — AB (ref 70–99)
GLUCOSE-CAPILLARY: 234 mg/dL — AB (ref 70–99)
Glucose-Capillary: 247 mg/dL — ABNORMAL HIGH (ref 70–99)
Glucose-Capillary: 267 mg/dL — ABNORMAL HIGH (ref 70–99)
Glucose-Capillary: 294 mg/dL — ABNORMAL HIGH (ref 70–99)

## 2013-06-08 MED ORDER — LABETALOL HCL 100 MG PO TABS
100.0000 mg | ORAL_TABLET | Freq: Two times a day (BID) | ORAL | Status: DC
  Administered 2013-06-08 (×2): 100 mg via NASOGASTRIC
  Filled 2013-06-08 (×6): qty 1

## 2013-06-08 NOTE — Plan of Care (Signed)
Problem: RH BOWEL ELIMINATION Goal: RH STG MANAGE BOWEL W/MEDICATION W/ASSISTANCE STG Manage Bowel with Medication with Mod Assistance.  Outcome: Progressing Dulcolax supp PR given with results

## 2013-06-08 NOTE — Progress Notes (Signed)
Subjective/Complaints: 53 y.o. female with history of HTN, DM, bipolar disorder, who was admitted on 05/27/13 with right facial droop and aphasia. 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 2 D echo with EF 55-60%. Carotid dopplers without significant ICA stenosis. 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 Patient with progressive neurologic worsening on 05/29/13 pm with RUE >RLE weakness. Started on hypertonic fluids and transferred to ICU for closer monitoring. Follow up CCT without significant changes. Patient with decrease in LOC with worsening of symptoms  No new problems. bp's remain very high. Family reports she slept pretty well after 0100  Review of Systems - Negative except aphasic not able to consistently do Y/N Objective: Vital Signs: Blood pressure 166/110, pulse 112, temperature 98.7 F (37.1 C), temperature source Oral, resp. rate 18, weight 71.7 kg (158 lb 1.1 oz), SpO2 97.00%. No results found. Results for orders placed during the hospital encounter of 06/04/13 (from the past 72 hour(s))  GLUCOSE, CAPILLARY     Status: Abnormal   Collection Time    06/05/13  9:34 AM      Result Value Ref Range   Glucose-Capillary 285 (*) 70 - 99 mg/dL  GLUCOSE, CAPILLARY     Status: Abnormal   Collection Time    06/05/13 11:53 AM      Result Value Ref Range   Glucose-Capillary 289 (*) 70 - 99 mg/dL   Comment 1 Notify RN    GLUCOSE, CAPILLARY     Status: Abnormal   Collection Time    06/05/13  4:39 PM      Result Value Ref Range   Glucose-Capillary 256 (*) 70 - 99 mg/dL  GLUCOSE, CAPILLARY     Status: Abnormal   Collection Time    06/05/13  8:05 PM      Result Value Ref Range   Glucose-Capillary 238 (*) 70 - 99 mg/dL  GLUCOSE, CAPILLARY     Status: Abnormal   Collection Time    06/05/13 11:49 PM      Result Value Ref Range   Glucose-Capillary 350 (*) 70 - 99 mg/dL   Comment 1 Notify RN    URINALYSIS, ROUTINE W REFLEX MICROSCOPIC     Status: Abnormal   Collection Time    06/06/13  1:24 AM      Result Value Ref Range   Color, Urine YELLOW  YELLOW   APPearance CLEAR  CLEAR   Specific Gravity, Urine 1.018  1.005 - 1.030   pH 7.0  5.0 - 8.0   Glucose, UA 500 (*) NEGATIVE mg/dL   Hgb urine dipstick NEGATIVE  NEGATIVE   Bilirubin Urine NEGATIVE  NEGATIVE   Ketones, ur NEGATIVE  NEGATIVE mg/dL   Protein, ur NEGATIVE  NEGATIVE mg/dL   Urobilinogen, UA 0.2  0.0 - 1.0 mg/dL   Nitrite NEGATIVE  NEGATIVE   Leukocytes, UA NEGATIVE  NEGATIVE   Comment: MICROSCOPIC NOT DONE ON URINES WITH NEGATIVE PROTEIN, BLOOD, LEUKOCYTES, NITRITE, OR GLUCOSE <1000 mg/dL.  URINE CULTURE     Status: None   Collection Time    06/06/13  1:24 AM      Result Value Ref Range   Specimen Description URINE,  CLEAN CATCH     Special Requests NONE     Culture  Setup Time       Value: 06/06/2013 09:06     Performed at [facility]       Value: NO GROWTH     Performed at [facility]   Culture       Value: NO GROWTH     Performed at [facility]   Report Status 06/07/2013 FINAL    GLUCOSE, CAPILLARY     Status: Abnormal   Collection Time    06/06/13  4:07 AM      Result Value Ref Range   Glucose-Capillary 258 (*) 70 - 99 mg/dL   Comment 1 Notify RN    GLUCOSE, CAPILLARY     Status: Abnormal   Collection Time    06/06/13  8:02 AM      Result Value Ref Range   Glucose-Capillary 296 (*) 70 - 99 mg/dL   Comment 1 Notify RN    GLUCOSE, CAPILLARY     Status: Abnormal   Collection Time    06/06/13 11:48 AM      Result Value Ref Range   Glucose-Capillary 282 (*) 70 - 99 mg/dL    Comment 1 Notify RN    GLUCOSE, CAPILLARY     Status: Abnormal   Collection Time    06/06/13  3:53 PM      Result Value Ref Range   Glucose-Capillary 269 (*) 70 - 99 mg/dL  GLUCOSE, CAPILLARY     Status: Abnormal   Collection Time    06/06/13  8:07 PM      Result Value Ref Range   Glucose-Capillary 276 (*) 70 - 99 mg/dL  GLUCOSE, CAPILLARY     Status: Abnormal   Collection Time    06/07/13 12:13 AM      Result Value Ref Range   Glucose-Capillary 239 (*) 70 - 99 mg/dL   Comment 1 Notify RN    GLUCOSE, CAPILLARY     Status: Abnormal   Collection Time    06/07/13  3:47 AM      Result Value Ref Range   Glucose-Capillary 291 (*) 70 - 99 mg/dL   Comment 1 Notify RN    GLUCOSE, CAPILLARY     Status: Abnormal   Collection Time    06/07/13  7:54 AM      Result Value Ref Range   Glucose-Capillary 246 (*) 70 - 99 mg/dL   Comment 1 Notify RN    GLUCOSE, CAPILLARY     Status: Abnormal   Collection Time    06/07/13 11:55 AM      Result Value Ref Range   Glucose-Capillary 245 (*) 70 - 99 mg/dL   Comment 1 Notify RN    GLUCOSE, CAPILLARY     Status: Abnormal   Collection Time    06/07/13  4:03 PM      Result Value Ref Range   Glucose-Capillary 287 (*) 70 - 99 mg/dL   Comment 1 Notify RN    GLUCOSE, CAPILLARY     Status: Abnormal   Collection Time    06/07/13  8:09 PM      Result Value Ref Range   Glucose-Capillary 246 (*) 70 - 99 mg/dL  GLUCOSE, CAPILLARY     Status: Abnormal   Collection Time    06/08/13 12:08 AM      Result Value Ref Range   Glucose-Capillary 267 (*) 70 -  99 mg/dL  GLUCOSE, CAPILLARY     Status: Abnormal   Collection Time    06/08/13  4:48 AM      Result Value Ref Range   Glucose-Capillary 307 (*) 70 - 99 mg/dL  GLUCOSE, CAPILLARY     Status: Abnormal   Collection Time    06/08/13  7:26 AM      Result Value Ref Range   Glucose-Capillary 294 (*) 70 - 99 mg/dL     HEENT: normal and NG tube left nares, no drainage or skin breakdown Cardio: RRR and no  murmur Resp: CTA B/L and unlabored GI: BS positive and non distended, Extremity:  Pulses positive and No Edema Skin:   Intact Neuro: alert today, Flat, Cranial Nerve Abnormalities R central 7, Abnormal Sensory withdraws to pinch in RUE and RLE, Abnormal Motor 0/5 RUE and RLE, normal on Left and Aphasic Musc/Skel:  Other no pain with R shoulder ROM Gen NAD   Assessment/Plan: 1. Functional deficits secondary to Left MCA infarct which require 3+ hours per day of interdisciplinary therapy in a comprehensive inpatient rehab setting. Physiatrist is providing close team supervision and 24 hour management of active medical problems listed below. Physiatrist and rehab team continue to assess barriers to discharge/monitor patient progress toward functional and medical goals. FIM: FIM - Bathing Bathing Steps Patient Completed: Chest;Abdomen;Right upper leg;Left upper leg Bathing: 2: Max-Patient completes 3-4 70f 10 parts or 25-49%  FIM - Upper Body Dressing/Undressing Upper body dressing/undressing steps patient completed: Thread/unthread left sleeve of pullover shirt/dress;Put head through opening of pull over shirt/dress Upper body dressing/undressing: 3: Mod-Patient completed 50-74% of tasks FIM - Lower Body Dressing/Undressing Lower body dressing/undressing: 1: Total-Patient completed less than 25% of tasks  FIM - Toileting Toileting: 1: Total-Patient completed zero steps, helper did all 3  FIM - Radio producer Devices: Bedside commode Toilet Transfers: 3-To toilet/BSC: Mod A (lift or lower assist);2-From toilet/BSC: Max A (lift and lower assist)  FIM - Engineer, site Assistive Devices: HOB elevated Bed/Chair Transfer: 4: Supine > Sit: Min A (steadying Pt. > 75%/lift 1 leg);2: Chair or W/C > Bed: Max A (lift and lower assist);2: Bed > Chair or W/C: Max A (lift and lower assist)  FIM - Locomotion: Wheelchair Distance: 55 Locomotion:  Wheelchair: 2: Travels 50 - 149 ft with minimal assistance (Pt.>75%) FIM - Locomotion: Ambulation Ambulation/Gait Assistance: 1: +2 Total assist Locomotion: Ambulation: 0: Activity did not occur  Comprehension Comprehension Mode: Auditory Comprehension: 4-Understands basic 75 - 89% of the time/requires cueing 10 - 24% of the time  Expression Expression Mode: Nonverbal Expression: 1-Expresses basis less than 25% of the time/requires cueing greater than 75% of the time.  Social Interaction Social Interaction: 4-Interacts appropriately 75 - 89% of the time - Needs redirection for appropriate language or to initiate interaction.  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 DVT Prophylaxis/Anticoagulation: Pharmaceutical: Lovenox  2. Pain Management: N/A  3. Bipolar disorder/Mood: No signs of distress reported but patient with poor awareness of deficits. Monitor mood for now. LCSW to follow with patient and family.  4. Neuropsych: This patient is not capable of making decisions  on her own behalf.  5. DM type 2: Will monitor with bid checks. Continue to titrate insulin upwards. . Use SSI for elevated BS.  6. HTN: adjust for better control avoiding hypotension with permissive high BP 180-200 range to prevent hypoperfusion/neuro worsening   -increased metoprolol yesterday without effect----change to labetalol today 7. H/o paranoid schizophrenia: Has been to [facility] in the past due to decompensation. Not on medications as this time--has been on respirdal in the past and has history of non-compliance per records  8. Dysphagia: NPO with panda TF. ?g-tube 9. Resting  Tachycardia: C -HR 110-120 range at rest.   On water flushes to avoid dehydration.   -BB LOS (Days) 4 A FACE TO FACE EVALUATION WAS PERFORMED  SWARTZ,ZACHARY T 06/08/2013, 8:10 AM

## 2013-06-09 ENCOUNTER — Inpatient Hospital Stay (HOSPITAL_COMMUNITY): Payer: Medicaid Other | Admitting: Speech Pathology

## 2013-06-09 ENCOUNTER — Inpatient Hospital Stay (HOSPITAL_COMMUNITY): Payer: Medicaid Other | Admitting: Physical Therapy

## 2013-06-09 ENCOUNTER — Encounter (HOSPITAL_COMMUNITY): Payer: Self-pay | Admitting: Occupational Therapy

## 2013-06-09 DIAGNOSIS — I10 Essential (primary) hypertension: Secondary | ICD-10-CM

## 2013-06-09 DIAGNOSIS — I6340 Cerebral infarction due to embolism of unspecified cerebral artery: Secondary | ICD-10-CM

## 2013-06-09 DIAGNOSIS — E119 Type 2 diabetes mellitus without complications: Secondary | ICD-10-CM

## 2013-06-09 DIAGNOSIS — F319 Bipolar disorder, unspecified: Secondary | ICD-10-CM

## 2013-06-09 LAB — GLUCOSE, CAPILLARY
Glucose-Capillary: 274 mg/dL — ABNORMAL HIGH (ref 70–99)
Glucose-Capillary: 289 mg/dL — ABNORMAL HIGH (ref 70–99)
Glucose-Capillary: 293 mg/dL — ABNORMAL HIGH (ref 70–99)
Glucose-Capillary: 299 mg/dL — ABNORMAL HIGH (ref 70–99)
Glucose-Capillary: 317 mg/dL — ABNORMAL HIGH (ref 70–99)
Glucose-Capillary: 390 mg/dL — ABNORMAL HIGH (ref 70–99)

## 2013-06-09 MED ORDER — LABETALOL HCL 100 MG PO TABS
100.0000 mg | ORAL_TABLET | Freq: Three times a day (TID) | ORAL | Status: DC
  Administered 2013-06-09 (×3): 100 mg via NASOGASTRIC
  Filled 2013-06-09 (×7): qty 1

## 2013-06-09 MED ORDER — INSULIN GLARGINE 100 UNIT/ML ~~LOC~~ SOLN
20.0000 [IU] | Freq: Two times a day (BID) | SUBCUTANEOUS | Status: DC
  Administered 2013-06-09 – 2013-07-08 (×56): 20 [IU] via SUBCUTANEOUS
  Filled 2013-06-09 (×71): qty 0.2

## 2013-06-09 NOTE — Plan of Care (Signed)
Problem: RH BOWEL ELIMINATION Goal: RH STG MANAGE BOWEL WITH ASSISTANCE STG Manage Bowel with Min Assistance.  Outcome: Not Progressing Total assist for bowel mgmt  Problem: RH BLADDER ELIMINATION Goal: RH STG MANAGE BLADDER WITH ASSISTANCE STG Manage Bladder With Min Assistance  Outcome: Not Progressing Total assist for bladder mgmt  Problem: RH KNOWLEDGE DEFICIT Goal: RH STG INCREASE KNOWLEDGE OF DIABETES Mod Assist ; family or caregiver will be able to explain CMM diet/dm monitor and management interventions. Caregiver will be able to draw up an administer insulin  Outcome: Not Progressing Need to educate caregivers Goal: RH STG INCREASE KNOWLEDGE OF HYPERTENSION Mod Assist ; family and caregiver will be able to explain interventions for management of HTN, low salt low fat diet and medications administered for BP control  Outcome: Not Progressing Need to educate caregivers

## 2013-06-09 NOTE — Progress Notes (Signed)
Physical Therapy Note  Patient Details  Name: GRICEL COPEN MRN: [id number] Date of Birth: [birth year] Today's Date: 06/09/2013  1300-1345 (45 minutes) individual Pain : no distress noted Other: hold therapies if systolic BP > 973 , diastolic > 532 or pulse > 120 (resting) pulse with activity > 140 Focus of treatment: bed mobility , standing tolerance Treatment: Pt in bed upon arrival and willing to participate in PT session; BP sitting 141/105  Pulse 110 Oxygen sats 96%; supine to sit min/mod assist RT LE/trunk; sit to stand x 3 to EVA walker mod assist with pulse 122- 124 BPM and no complaint of dizziness ;pt stands with good knee extension on right ; BP supine 180 /120 post session pulse 114 BPM ; nursing present.     Jazara Swiney,JIM 06/09/2013, 1:47 PM

## 2013-06-09 NOTE — Progress Notes (Signed)
Speech Language Pathology Note  Patient Details  Name: [name] MRN: [id number] Date of Birth: 05-12-1960 Today's Date: 06/07/13  2:30-3:15  Skilled treatment complete with short term goals addressed. Despite maximum verbal, tactile cues, patient was not able to swallow and is unable to manipulate and swallow secretions. She has right anterolateral drooling that requires wiping as well as oral cavity suctioning intermittently. Melodic Intonation Therapy was performed today, but patient was not able to phonate. When she coughs, voicing is present. She did attempt to approximate articulators a few times during therapy, but it also appeared to be more of a perseverative movement. Continue with current treatment plan.  Frances Maywood 06/09/2013, 4:00 PM

## 2013-06-09 NOTE — Progress Notes (Signed)
Speech Language Pathology Daily Session Note  Patient Details  Name: [name] MRN: [id number] Date of Birth: [birth year]  Today's Date: 06/09/2013 Time: 9937-1696 Time Calculation (min): 40 min  Short Term Goals: Week 1: SLP Short Term Goal 1 (Week 1): Patient will initiiate 5 swallows per session with Max multi-modal cues. SLP Short Term Goal 2 (Week 1): Patient will follow 1-step directions with Max multi-modal cues. SLP Short Term Goal 3 (Week 1): Patient will identify object from a field of 2 with Max mlulti-modal cues. SLP Short Term Goal 4 (Week 1): Patient will approximate speech sounds with Max assist multi-modal cues.  Skilled Therapeutic Interventions: Skilled treatment session focused on addressing dysphagia goals.  Patient followed 1-step directions with hand over hand assist to thoroughly complete oral care via suctioning.  SLP facilitated session with hand over hand assist for self-fed ice chip trials.  Patient demonstrated limited oral manipulation and no swallow initiation.  Upon SLP requests and tactile cues to open mouth patient had bolus pooled in anterior portion of mouth.  SLP removed with suctioning.  Patient with poor management of oral secretions throughout session resulting in right anterior loss and need for frequent suctioning throughout session. Cough observed x2 during session due to poor management of oral secretions.    FIM:  Comprehension Comprehension Mode: Auditory Comprehension: 2-Understands basic 25 - 49% of the time/requires cueing 51 - 75% of the time Expression Expression Mode: Verbal Expression: 1-Expresses basis less than 25% of the time/requires cueing greater than 75% of the time. Social Interaction Social Interaction: 1-Interacts appropriately less than 25% of the time. May be withdrawn or combative. Problem Solving Problem Solving: 1-Solves basic less than 25% of the time - needs direction nearly all the time or does not effectively  solve problems and may need a restraint for safety Memory Memory: 2-Recognizes or recalls 25 - 49% of the time/requires cueing 51 - 75% of the time FIM - Eating Eating Activity: 0: Activity did not occur  Pain Pain Assessment Pain Assessment: No/denies pain  Therapy/Group: Individual Therapy  Carmelia Roller., CCC-SLP [phone]  [place] 06/09/2013, 1:23 PM

## 2013-06-09 NOTE — Progress Notes (Signed)
Speech Language Pathology Daily Session Note  Patient Details  Name: [name] MRN: [id number] Date of Birth: 09-30-1960  Today's Date: 06/09/2013 Time: 1432-1500 Time Calculation (min): 28 min  Short Term Goals: Week 1: SLP Short Term Goal 1 (Week 1): Patient will initiiate 5 swallows per session with Max multi-modal cues. SLP Short Term Goal 2 (Week 1): Patient will follow 1-step directions with Max multi-modal cues. SLP Short Term Goal 3 (Week 1): Patient will identify object from a field of 2 with Max mlulti-modal cues. SLP Short Term Goal 4 (Week 1): Patient will approximate speech sounds with Max assist multi-modal cues.  Skilled Therapeutic Interventions: Skilled treatment session focused on addressing cognitive-linguistic goals.  Patient answered yes/no questions via head nod with Max multi-modal cues for 60% accuracy.  SLP also facilitated bilabial approximations with CVC words with Max assist verbal and visual cues.  Patient required Total multi-modal cues to identify named item form a field of three.  Daughter present for session today and SLP initiated education regarding dysphagia, aphasia and apraxia.  Continue with current plan of care.      FIM:  Comprehension Comprehension Mode: Auditory Comprehension: 2-Understands basic 25 - 49% of the time/requires cueing 51 - 75% of the time Expression Expression Mode: Verbal Expression: 1-Expresses basis less than 25% of the time/requires cueing greater than 75% of the time. Social Interaction Social Interaction: 1-Interacts appropriately less than 25% of the time. May be withdrawn or combative. Problem Solving Problem Solving: 1-Solves basic less than 25% of the time - needs direction nearly all the time or does not effectively solve problems and may need a restraint for safety Memory Memory: 2-Recognizes or recalls 25 - 49% of the time/requires cueing 51 - 75% of the time  Pain Pain Assessment Pain Assessment:  No/denies pain  Therapy/Group: Individual Therapy  Carmelia Roller., [place] [phone]  [place] 06/09/2013, 4:15 PM

## 2013-06-09 NOTE — Progress Notes (Signed)
Subjective/Complaints: 53 y.o. female with history of HTN, DM, bipolar disorder, who was admitted on 05/27/13 with right facial droop and aphasia. 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 2 D echo with EF 55-60%. Carotid dopplers without significant ICA stenosis. 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 Patient with progressive neurologic worsening on 05/29/13 pm with RUE >RLE weakness. Started on hypertonic fluids and transferred to ICU for closer monitoring. Follow up CCT without significant changes. Patient with decrease in LOC with worsening of symptoms  No new problems. bp's remain very high.   Review of Systems - Negative except aphasic not able to consistently do Y/N Objective: Vital Signs: Blood pressure 174/105, pulse 113, temperature 98.1 F (36.7 C), temperature source Oral, resp. rate 18, weight 72.7 kg (160 lb 4.4 oz), SpO2 96.00%. No results found. Results for orders placed during the hospital encounter of 06/04/13 (from the past 72 hour(s))  GLUCOSE, CAPILLARY     Status: Abnormal   Collection Time    06/06/13  8:02 AM      Result Value Ref Range   Glucose-Capillary 296 (*) 70 - 99 mg/dL   Comment 1 Notify RN    GLUCOSE, CAPILLARY     Status: Abnormal   Collection Time    06/06/13 11:48 AM      Result Value Ref Range   Glucose-Capillary 282 (*) 70 - 99 mg/dL   Comment 1 Notify RN    GLUCOSE, CAPILLARY     Status: Abnormal   Collection Time    06/06/13  3:53 PM      Result Value Ref Range   Glucose-Capillary 269 (*) 70 - 99 mg/dL  GLUCOSE, CAPILLARY     Status: Abnormal   Collection Time   06/06/13  8:07 PM      Result Value Ref Range   Glucose-Capillary 276 (*) 70 - 99 mg/dL  GLUCOSE, CAPILLARY     Status: Abnormal   Collection Time    06/07/13 12:13 AM      Result Value Ref Range   Glucose-Capillary 239 (*) 70 - 99 mg/dL   Comment 1 Notify RN    GLUCOSE, CAPILLARY     Status: Abnormal   Collection Time    06/07/13  3:47 AM      Result Value Ref Range   Glucose-Capillary 291 (*) 70 - 99 mg/dL   Comment 1 Notify RN    GLUCOSE, CAPILLARY     Status: Abnormal   Collection Time    06/07/13  7:54 AM      Result Value Ref Range   Glucose-Capillary 246 (*) 70 - 99 mg/dL   Comment 1 Notify RN    GLUCOSE, CAPILLARY     Status: Abnormal   Collection Time    06/07/13 11:55 AM      Result Value Ref Range   Glucose-Capillary 245 (*) 70 - 99 mg/dL   Comment 1 Notify RN    GLUCOSE, CAPILLARY     Status: Abnormal   Collection Time    06/07/13  4:03 PM      Result Value Ref Range   Glucose-Capillary 287 (*) 70 - 99 mg/dL  Comment 1 Notify RN    GLUCOSE, CAPILLARY     Status: Abnormal   Collection Time    06/07/13  8:09 PM      Result Value Ref Range   Glucose-Capillary 246 (*) 70 - 99 mg/dL  GLUCOSE, CAPILLARY     Status: Abnormal   Collection Time    06/08/13 12:08 AM      Result Value Ref Range   Glucose-Capillary 267 (*) 70 - 99 mg/dL  GLUCOSE, CAPILLARY     Status: Abnormal   Collection Time    06/08/13  4:48 AM      Result Value Ref Range   Glucose-Capillary 307 (*) 70 - 99 mg/dL  GLUCOSE, CAPILLARY     Status: Abnormal   Collection Time    06/08/13  7:26 AM      Result Value Ref Range   Glucose-Capillary 294 (*) 70 - 99 mg/dL  GLUCOSE, CAPILLARY     Status: Abnormal   Collection Time    06/08/13 11:10 AM      Result Value Ref Range   Glucose-Capillary 292 (*) 70 - 99 mg/dL  GLUCOSE, CAPILLARY     Status: Abnormal   Collection Time    06/08/13  4:44 PM      Result Value Ref Range   Glucose-Capillary 247 (*) 70 - 99 mg/dL  GLUCOSE, CAPILLARY      Status: Abnormal   Collection Time    06/08/13  8:10 PM      Result Value Ref Range   Glucose-Capillary 234 (*) 70 - 99 mg/dL   Comment 1 Notify RN    GLUCOSE, CAPILLARY     Status: Abnormal   Collection Time    06/09/13 12:02 AM      Result Value Ref Range   Glucose-Capillary 274 (*) 70 - 99 mg/dL   Comment 1 Notify RN    GLUCOSE, CAPILLARY     Status: Abnormal   Collection Time    06/09/13  4:05 AM      Result Value Ref Range   Glucose-Capillary 289 (*) 70 - 99 mg/dL   Comment 1 Notify RN       HEENT: normal and NG tube left nares, no drainage or skin breakdown Cardio: RRR and no murmur Resp: CTA B/L and unlabored GI: BS positive and non distended, Extremity:  Pulses positive and No Edema Skin:   Intact Neuro: alert today, Flat, Cranial Nerve Abnormalities R central 7, Abnormal Sensory withdraws to pinch in RUE and RLE, Abnormal Motor 0/5 RUE and RLE, normal on Left and Aphasic Musc/Skel:  Other no pain with R shoulder ROM Gen NAD   Assessment/Plan: 1. Functional deficits secondary to Left MCA infarct which require 3+ hours per day of interdisciplinary therapy in a comprehensive inpatient rehab setting. Physiatrist is providing close team supervision and 24 hour management of active medical problems listed below. Physiatrist and rehab team continue to assess barriers to discharge/monitor patient progress toward functional and medical goals. FIM: FIM - Bathing Bathing Steps Patient Completed: Chest;Abdomen;Right upper leg;Left upper leg Bathing: 2: Max-Patient completes 3-4 54f 10 parts or 25-49%  FIM - Upper Body Dressing/Undressing Upper body dressing/undressing steps patient completed: Thread/unthread left sleeve of pullover shirt/dress;Put head through opening of pull over shirt/dress Upper body dressing/undressing: 3: Mod-Patient completed 50-74% of tasks FIM - Lower Body Dressing/Undressing Lower body dressing/undressing: 1: Total-Patient completed less than 25% of  tasks  FIM - Toileting Toileting: 1: Total-Patient completed zero steps, helper did all  3  FIM - Radio producer Devices: Bedside commode Toilet Transfers: 3-To toilet/BSC: Mod A (lift or lower assist);2-To toilet/BSC: Max A (lift and lower assist)  FIM - Engineer, site Assistive Devices: HOB elevated Bed/Chair Transfer: 4: Sit > Supine: Min A (steadying pt. > 75%/lift 1 leg);2: Bed > Chair or W/C: Max A (lift and lower assist);2: Sit > Supine: Max A (lifting assist/Pt. 25-49%)  FIM - Locomotion: Wheelchair Distance: 55 Locomotion: Wheelchair: 2: Travels 50 - 149 ft with minimal assistance (Pt.>75%) FIM - Locomotion: Ambulation Ambulation/Gait Assistance: 1: +2 Total assist Locomotion: Ambulation: 0: Activity did not occur  Comprehension Comprehension Mode: Auditory Comprehension: 4-Understands basic 75 - 89% of the time/requires cueing 10 - 24% of the time  Expression Expression Mode: Nonverbal Expression: 1-Expresses basis less than 25% of the time/requires cueing greater than 75% of the time.  Social Interaction Social Interaction: 4-Interacts appropriately 75 - 89% of the time - Needs redirection for appropriate language or to initiate interaction.  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 DVT Prophylaxis/Anticoagulation: Pharmaceutical: Lovenox  2. Pain Management: N/A  3. Bipolar disorder/Mood: No signs of distress reported but patient with poor awareness of deficits. Monitor mood for now. LCSW to follow with patient and family.  4. Neuropsych: This patient is not capable of making  decisions on her own behalf.  5. DM type 2: Will monitor with bid checks. Continue to titrate insulin upwards. . Use SSI for elevated BS.  6. HTN: adjust for better control avoiding hypotension with permissive high BP 180-200 range to prevent hypoperfusion/neuro worsening   -increased metoprolol yesterday without effect----change to labetalol today 7. H/o paranoid schizophrenia: Has been to [facility] in the past due to decompensation. Not on medications as this time--has been on respirdal in the past and has history of non-compliance per records  8. Dysphagia: NPO with panda TF. ?g-tube 9. Resting Tachycardia: C -HR 110-120 range at rest.   On water flushes to avoid dehydration.   -BB will titrate dose LOS (Days) 5 A FACE TO FACE EVALUATION WAS PERFORMED  KIRSTEINS,ANDREW E 06/09/2013, 7:31 AM

## 2013-06-09 NOTE — Progress Notes (Signed)
Occupational Therapy Note  Patient Details  Name: [name] MRN: [id number] Date of Birth: November 20, 1960 Today's Date: 06/09/2013  Time In:  0900 Time Out:  0955.  Individual session/ does not appear in pain. Clarified BP and resting HR parameters with MD as nursing and therapy had different parameters in order set.  Per MD, if systolic BP is greater than 212 and/or diastolic greater than 248 and if resting HR greater than 120 therapies should stop treatment and document what was outside of  parameter.  At outset of session resting HR was 118, BP was 189/105.  Initiated ADL retraining at bed level given how close patient was to parameter;  RN had just provided BP medication.  Skilled intervention focused on bed mobility, sustained attention, following one step commands, motor planning, basic sequencing, engaging in activity, increasing activity tolerance, increasing independence with bathing.  Patient does not have street clothes and will ask social worker to call family.  Patient follows one step commands with gestures and within functional context.  Patient did not verbalize at all during our session. At end of session, BP was 184/105, HR was 120.  Patient left in bed as PT session was due to start within 30 minutes.  RN aware. BP and HR parameters have been placed above patient's bed.   [place] Burow 06/09/2013, 10:31 AM

## 2013-06-10 ENCOUNTER — Inpatient Hospital Stay (HOSPITAL_COMMUNITY): Payer: Medicaid Other | Admitting: Speech Pathology

## 2013-06-10 ENCOUNTER — Inpatient Hospital Stay (HOSPITAL_COMMUNITY): Payer: Medicaid Other | Admitting: Physical Therapy

## 2013-06-10 ENCOUNTER — Inpatient Hospital Stay (HOSPITAL_COMMUNITY): Payer: Medicaid Other | Admitting: Occupational Therapy

## 2013-06-10 DIAGNOSIS — E119 Type 2 diabetes mellitus without complications: Secondary | ICD-10-CM

## 2013-06-10 DIAGNOSIS — F319 Bipolar disorder, unspecified: Secondary | ICD-10-CM

## 2013-06-10 DIAGNOSIS — I10 Essential (primary) hypertension: Secondary | ICD-10-CM

## 2013-06-10 DIAGNOSIS — I6340 Cerebral infarction due to embolism of unspecified cerebral artery: Secondary | ICD-10-CM

## 2013-06-10 LAB — GLUCOSE, CAPILLARY
GLUCOSE-CAPILLARY: 250 mg/dL — AB (ref 70–99)
GLUCOSE-CAPILLARY: 260 mg/dL — AB (ref 70–99)
GLUCOSE-CAPILLARY: 273 mg/dL — AB (ref 70–99)
Glucose-Capillary: 182 mg/dL — ABNORMAL HIGH (ref 70–99)
Glucose-Capillary: 236 mg/dL — ABNORMAL HIGH (ref 70–99)
Glucose-Capillary: 268 mg/dL — ABNORMAL HIGH (ref 70–99)
Glucose-Capillary: 275 mg/dL — ABNORMAL HIGH (ref 70–99)

## 2013-06-10 MED ORDER — INSULIN ASPART 100 UNIT/ML ~~LOC~~ SOLN
4.0000 [IU] | SUBCUTANEOUS | Status: DC
Start: 2013-06-10 — End: 2013-06-18
  Administered 2013-06-10 – 2013-06-18 (×37): 4 [IU] via SUBCUTANEOUS

## 2013-06-10 MED ORDER — LABETALOL HCL 200 MG PO TABS
200.0000 mg | ORAL_TABLET | Freq: Two times a day (BID) | ORAL | Status: DC
Start: 2013-06-10 — End: 2013-06-12
  Administered 2013-06-10 – 2013-06-11 (×4): 200 mg via NASOGASTRIC
  Filled 2013-06-10 (×7): qty 1

## 2013-06-10 NOTE — Progress Notes (Signed)
Speech Language Pathology Daily Session Note  Patient Details  Name: EMALIA WITKOP MRN: [id number] Date of Birth: 07/09/1960  Today's Date: 06/10/2013 Time: 0973-5329 Time Calculation (min): 40 min  Short Term Goals: Week 1: SLP Short Term Goal 1 (Week 1): Patient will initiiate 5 swallows per session with Max multi-modal cues. SLP Short Term Goal 2 (Week 1): Patient will follow 1-step directions with Max multi-modal cues. SLP Short Term Goal 3 (Week 1): Patient will identify object from a field of 2 with Max mlulti-modal cues. SLP Short Term Goal 4 (Week 1): Patient will approximate speech sounds with Max assist multi-modal cues.  Skilled Therapeutic Interventions: Skilled treatment session focused on addressing cognitive-linguistic goals.  Patient answered yes/no questions regarding needs in environment via head nod with Max multi-modal clinician cues to achieve 50% accuracy.  SLP also facilitated session with Total multi-modal cues to identify named items from a field of three.  SLP provided melodic, verbal, visual and tactile cues to encourage particiaptein in verbal automatic verbal sequences.  Patient demonstrated increased difficulty with bilabial approximations today.  Continue with current plan of care.      FIM:  Comprehension Comprehension Mode: Auditory Comprehension: 2-Understands basic 25 - 49% of the time/requires cueing 51 - 75% of the time Expression Expression Mode: Verbal Expression: 1-Expresses basis less than 25% of the time/requires cueing greater than 75% of the time. Social Interaction Social Interaction: 1-Interacts appropriately less than 25% of the time. May be withdrawn or combative. Problem Solving Problem Solving: 1-Solves basic less than 25% of the time - needs direction nearly all the time or does not effectively solve problems and may need a restraint for safety Memory Memory: 2-Recognizes or recalls 25 - 49% of the time/requires cueing 51 - 75% of  the time FIM - Eating Eating Activity: 1: Helper performs IV, parenteral, or tube feeding  Pain Pain Assessment Pain Assessment: No/denies pain  Therapy/Group: Individual Therapy  Carmelia Roller., CCC-SLP [phone]  [place] 06/10/2013, 1:21 PM

## 2013-06-10 NOTE — Progress Notes (Signed)
Subjective/Complaints: 53 y.o. female with history of HTN, DM, bipolar disorder, who was admitted on 05/27/13 with right facial droop and aphasia. 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 2 D echo with EF 55-60%. Carotid dopplers without significant ICA stenosis. 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 Patient with progressive neurologic worsening on 05/29/13 pm with RUE >RLE weakness. Started on hypertonic fluids and transferred to ICU for closer monitoring. Follow up CCT without significant changes. Patient with decrease in LOC with worsening of symptoms  No new problems. bp's remain high, therapy being held for exceeding parameters, pt appears comfortable and asymptomatic but has limited expressive language abilities   Review of Systems - Negative except aphasic not able to consistently do Y/N Objective: Vital Signs: Blood pressure 171/101, pulse 111, temperature 98.4 F (36.9 C), temperature source Oral, resp. rate 16, weight 70.7 kg (155 lb 13.8 oz), SpO2 96.00%. No results found. Results for orders placed during the hospital encounter of 06/04/13 (from the past 72 hour(s))  GLUCOSE, CAPILLARY     Status: Abnormal   Collection Time    06/07/13  7:54 AM      Result Value Ref Range   Glucose-Capillary 246 (*) 70 - 99 mg/dL   Comment 1 Notify RN    GLUCOSE, CAPILLARY     Status: Abnormal   Collection Time    06/07/13 11:55 AM      Result Value Ref Range   Glucose-Capillary 245 (*) 70 - 99 mg/dL   Comment 1 Notify RN    GLUCOSE, CAPILLARY     Status: Abnormal   Collection Time    06/07/13  4:03 PM      Result  Value Ref Range   Glucose-Capillary 287 (*) 70 - 99 mg/dL   Comment 1 Notify RN    GLUCOSE, CAPILLARY     Status: Abnormal   Collection Time    06/07/13  8:09 PM      Result Value Ref Range   Glucose-Capillary 246 (*) 70 - 99 mg/dL  GLUCOSE, CAPILLARY     Status: Abnormal   Collection Time    06/08/13 12:08 AM      Result Value Ref Range   Glucose-Capillary 267 (*) 70 - 99 mg/dL  GLUCOSE, CAPILLARY     Status: Abnormal   Collection Time    06/08/13  4:48 AM      Result Value Ref Range   Glucose-Capillary 307 (*) 70 - 99 mg/dL  GLUCOSE, CAPILLARY     Status: Abnormal   Collection Time    06/08/13  7:26 AM      Result Value Ref Range   Glucose-Capillary 294 (*) 70 - 99 mg/dL  GLUCOSE, CAPILLARY     Status: Abnormal   Collection Time    06/08/13 11:10 AM      Result Value Ref Range   Glucose-Capillary 292 (*) 70 - 99 mg/dL  GLUCOSE, CAPILLARY     Status: Abnormal   Collection Time    06/08/13  4:44 PM      Result Value Ref Range   Glucose-Capillary 247 (*) 70 - 99 mg/dL  GLUCOSE, CAPILLARY     Status:  Abnormal   Collection Time    06/08/13  8:10 PM      Result Value Ref Range   Glucose-Capillary 234 (*) 70 - 99 mg/dL   Comment 1 Notify RN    GLUCOSE, CAPILLARY     Status: Abnormal   Collection Time    06/09/13 12:02 AM      Result Value Ref Range   Glucose-Capillary 274 (*) 70 - 99 mg/dL   Comment 1 Notify RN    GLUCOSE, CAPILLARY     Status: Abnormal   Collection Time    06/09/13  4:05 AM      Result Value Ref Range   Glucose-Capillary 289 (*) 70 - 99 mg/dL   Comment 1 Notify RN    GLUCOSE, CAPILLARY     Status: Abnormal   Collection Time    06/09/13  7:45 AM      Result Value Ref Range   Glucose-Capillary 317 (*) 70 - 99 mg/dL   Comment 1 Notify RN    GLUCOSE, CAPILLARY     Status: Abnormal   Collection Time    06/09/13 11:32 AM      Result Value Ref Range   Glucose-Capillary 293 (*) 70 - 99 mg/dL   Comment 1 Notify RN    GLUCOSE, CAPILLARY     Status:  Abnormal   Collection Time    06/09/13  4:24 PM      Result Value Ref Range   Glucose-Capillary 390 (*) 70 - 99 mg/dL  GLUCOSE, CAPILLARY     Status: Abnormal   Collection Time    06/09/13  8:06 PM      Result Value Ref Range   Glucose-Capillary 299 (*) 70 - 99 mg/dL  GLUCOSE, CAPILLARY     Status: Abnormal   Collection Time    06/10/13 12:06 AM      Result Value Ref Range   Glucose-Capillary 250 (*) 70 - 99 mg/dL  GLUCOSE, CAPILLARY     Status: Abnormal   Collection Time    06/10/13  3:56 AM      Result Value Ref Range   Glucose-Capillary 273 (*) 70 - 99 mg/dL     HEENT: normal and NG tube left nares, no drainage or skin breakdown Cardio: RRR and no murmur Resp: CTA B/L and unlabored GI: BS positive and non distended, Extremity:  Pulses positive and No Edema Skin:   Intact Neuro: alert today, Flat, Cranial Nerve Abnormalities R central 7, Abnormal Sensory  Abnormal Motor 0/5 RUE and RLE, normal on Left and Aphasic Opens mouth to command but does not protrude tongue, then perseverates on mouth opening when asked to move arm Musc/Skel:  Other no pain with R shoulder ROM Gen NAD   Assessment/Plan: 1. Functional deficits secondary to Left MCA infarct which require 3+ hours per day of interdisciplinary therapy in a comprehensive inpatient rehab setting. Physiatrist is providing close team supervision and 24 hour management of active medical problems listed below. Physiatrist and rehab team continue to assess barriers to discharge/monitor patient progress toward functional and medical goals. FIM: FIM - Bathing Bathing Steps Patient Completed: Chest;Abdomen Bathing: 1: Total-Patient completes 0-2 of 10 parts or less than 25%  FIM - Upper Body Dressing/Undressing Upper body dressing/undressing steps patient completed: Thread/unthread left sleeve of pullover shirt/dress;Put head through opening of pull over shirt/dress Upper body dressing/undressing: 0: Wears gown/pajamas-no  public clothing FIM - Lower Body Dressing/Undressing Lower body dressing/undressing: 0: Wears Interior and spatial designer  FIM - Toileting  Toileting: 0: Activity did not occur  FIM - Radio producer Devices: Recruitment consultant Transfers: 0-Activity did not occur  FIM - Control and instrumentation engineer Devices: HOB elevated Bed/Chair Transfer: 4: Sit > Supine: Min A (steadying pt. > 75%/lift 1 leg);2: Bed > Chair or W/C: Max A (lift and lower assist);2: Sit > Supine: Max A (lifting assist/Pt. 25-49%)  FIM - Locomotion: Wheelchair Distance: 55 Locomotion: Wheelchair: 2: Travels 50 - 149 ft with minimal assistance (Pt.>75%) FIM - Locomotion: Ambulation Ambulation/Gait Assistance: 1: +2 Total assist Locomotion: Ambulation: 0: Activity did not occur  Comprehension Comprehension Mode: Auditory Comprehension: 2-Understands basic 25 - 49% of the time/requires cueing 51 - 75% of the time  Expression Expression Mode: Verbal Expression: 1-Expresses basis less than 25% of the time/requires cueing greater than 75% of the time.  Social Interaction Social Interaction: 1-Interacts appropriately less than 25% of the time. May be withdrawn or combative.  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 DVT Prophylaxis/Anticoagulation: Pharmaceutical: Lovenox  2. Pain Management: N/A  3. Bipolar disorder/Mood: No signs of distress reported but patient with poor awareness of deficits. Monitor mood for now. LCSW to follow with patient and family.  4. Neuropsych: This patient is not capable of making  decisions on her own behalf.  5. DM type 2: Will monitor with bid checks. Continue to titrate insulin upwards. . Use SSI for elevated BS.  6. HTN: adjust for better control avoiding hypotension with permissive high BP 180-200 range to prevent hypoperfusion/neuro worsening   -increased metoprolol yesterday without effect----change to labetalol today 7. H/o paranoid schizophrenia: Has been to [facility] in the past due to decompensation. Not on medications as this time--has been on respiridone in the past and has history of non-compliance per records  8. Dysphagia: NPO with panda TF. ?g-tube 9. Resting Tachycardia: C -HR 110-120 range at rest.   On water flushes to avoid dehydration.   -BB will titrate dose LOS (Days) 6 A FACE TO FACE EVALUATION WAS PERFORMED  Arriah Wadle E 06/10/2013, 7:17 AM

## 2013-06-10 NOTE — Progress Notes (Signed)
Physical Therapy Session Note  Patient Details  Name: [name] MRN: [id number] Date of Birth: Dec 02, 1960  Today's Date: 06/10/2013 Time: 1104-1200 and  3220-2542 Time Calculation (min): 56 min and 31 min  Short Term Goals: Week 1:  PT Short Term Goal 1 (Week 1): Pt will perform bed mobility to L and R with mod A and 75% cues for initiation and sequencing PT Short Term Goal 2 (Week 1): Pt will perform bed <> w/c transfers with mod A and 75% cues for initiation and sequencing PT Short Term Goal 3 (Week 1): Pt will perform w/c mobility x 50' on unit with L hemi technique and mod A with 75% cues for sequencing PT Short Term Goal 4 (Week 1): Pt will perform gait x 25' with LRAD and mod A  PT Short Term Goal 5 (Week 1): Pt will negotiate 3 stairs with one rail and max A and 75% cues for sequencing  Skilled Therapeutic Interventions/Progress Updates:   Pt resting in recliner; denied pain with head nod.  Pt performed squat pivot recliner > w/c with mod A and min-mod verbal and gesturing cues for initiation and sequencing.  Had pt initiation use of hand communication to choose which pair of shoes she would like to wear for therapy.  Required total verbal and hand over hand assistance for pt to initiate choice.  Performed w/c mobility training with L foot propulsion x 100' with max A and hand over LE assistance for sequencing and to maintain forwards momentum.  Performed transfers w/c <> mat and sit <> supine mod A on flat mat. Performed scooting and rolling to L side with mod A.  Performed NMR; see below for details.  Returned to room and to recliner mod A.  BP checked intermittently during session; all vitals remained below cease threshold.   PM session: Pt resting in recliner.  BP assessed: 176/108, HR: 100 bpm.  Pt transferred recliner > w/c stand pivot with max A and verbal cues hand placement and pivoting sequence.  In gym pt set up beside elevated mat for LUE support during sit > stand and  supported standing for NMR with focus on postural control, R lateral weight shift, WB and weight acceptance through RLE for activation of RLE stability and extensor muscles during LLE taps to 4" step.  Pt did not actually tap step with foot but would elevate LLE for single limb stance.  Pt only able to shift 30% of weight to R and maintained pelvis shifted to L with R hip drop.  Pt required max A for activity to maintain stability of RLE and for balance secondary to pushing trunk to R through LUE on mat table.  Required one sitting rest break where pt practiced L foot tapping to step without UE support and maintaining trunk in midline in w/c.  Pt noted to be incontinent of urine.  Returned to room in w/c and to bed with max A stand pivot and sit > supine with mod A to be changed/cleaned by RN staff.     Therapy Documentation Precautions:  Precautions Precautions: Fall Precaution Comments: right hemiperesis, NPO and maintain HOB >30 deg; MONITOR VITALS; Alert RN for BP >180/>105 and hold activity for BP 220/120, monitor for signs of aspiration or abdominal pain and distention Restrictions Weight Bearing Restrictions: No Vital Signs: Therapy Vitals Pulse Rate: 100 BP: 164/109 mmHg Patient Position, if appropriate: Sitting Oxygen Therapy SpO2: 100 % O2 Device: None (Room air) Pain: Pain Assessment Pain Assessment:  No/denies pain Locomotion : Ambulation Ambulation/Gait Assistance: 1: +2 Total assist Wheelchair Mobility Distance: 100  Other Treatments: Treatments Neuromuscular Facilitation: Right;Lower Extremity;Activity to increase coordination;Activity to increase motor control;Activity to increase timing and sequencing;Activity to increase sustained activation in L sidelying with RLE on powder board with use of mirror and cone as visual feedback and target for activation of hip flexion, hip extension, knee flexion and knee extension.  2/5 hip flexion noted and trace hip extension, knee  flexion and extension noted.  Performed gait with 3 musketeers style x 25' (+2 total A) with assistance for lateral and forwards weight shifting, pt able to initiate hip flexion/advancement but required assistance for full foot clearance and placement, assistance for hip and knee control in stance and total verbal cues for sequencing of forwards, lateral and retro stepping.  Some active glute and hamstring activation noted in WB.  See FIM for current functional status  Therapy/Group: Individual Therapy  Raylene Everts [facility] 06/10/2013, 12:23 PM

## 2013-06-10 NOTE — Progress Notes (Signed)
PLEASE CONSIDER ADDING TUBE FEED COVERAGE AS REQUESTED PREVIOUSLY. All glucose levels in mid to upper 100's. Pt cannot absorb nutrients of tube feeds without sufficient insulin to transfer to the body cells. Please add at least 4 units novolog tube feed coverage q 4 hrs.  Thank you, Rosita Kea, RN, CNS, Diabetes Coordinator [phone])

## 2013-06-10 NOTE — Progress Notes (Signed)
Occupational Therapy Session Note  Patient Details  Name: [name] MRN: [id number] Date of Birth: [birth year]  Today's Date: 06/10/2013 Time: 3295-1884 Time Calculation (min): 53 min  Short Term Goals: Week 1:  OT Short Term Goal 1 (Week 1): Pt will complete bathing with mod assist OT Short Term Goal 2 (Week 1): Pt will complete UB dressing with mod assist OT Short Term Goal 3 (Week 1): Pt will complete LB dressing with mod assist OT Short Term Goal 4 (Week 1): Pt will complete toilet transfer with mod assist OT Short Term Goal 5 (Week 1): Pt will position RUE prior to transfers with min verbal cues  Skilled Therapeutic Interventions/Progress Updates:    Engaged in ADL retraining with pt with increased participation and sequencing with bathing and dressing seated at EOB.  Pt in bed upon arrival, BP 144/89 and HR 102 at rest.  Engaged in perineal hygiene at bed level with rolling Rt and Lt with pt able to utilize bed rails to roll to Rt, requiring mod assist to roll to Lt due to requiring assist to position RUE and RLE to assist in rolling.  Bathing and dressing completed seated EOB with pt with improved sequencing with washing UB and progressing to LB without any cues.  Pt required verbal and tactile cues to attend to and wash RUE as inattention to RUE during functional task of bathing.  Educated on hemi-dressing technique with UB and LB dressing with pt able to assist with threading shirt over RUE.  Sit > stand to pull up pants with min assist for sit > stand and therapist pulling pants over hips. Stand pivot transfer to Lt to recliner at end of session. BP 149/98 and HR 102 after standing and transfer to recliner.  RN present to secure NG tube as loose throughout session.  Therapy Documentation Precautions:  Precautions Precautions: Fall Precaution Comments: right hemiperesis, NPO and maintain HOB >30 deg; MONITOR VITALS; Alert RN for BP >180/>105 and hold activity for BP 220/120,  monitor for signs of aspiration or abdominal pain and distention Restrictions Weight Bearing Restrictions: No General:   Vital Signs: Therapy Vitals Pulse Rate: 102 BP: 149/98 mmHg Patient Position, if appropriate: Sitting Pain:  Pt with no c/o pain  See FIM for current functional status  Therapy/Group: Individual Therapy  Simonne Come 06/10/2013, 12:11 PM

## 2013-06-11 ENCOUNTER — Inpatient Hospital Stay (HOSPITAL_COMMUNITY): Payer: Medicaid Other | Admitting: Physical Therapy

## 2013-06-11 ENCOUNTER — Inpatient Hospital Stay (HOSPITAL_COMMUNITY): Payer: Medicaid Other | Admitting: Speech Pathology

## 2013-06-11 ENCOUNTER — Inpatient Hospital Stay (HOSPITAL_COMMUNITY): Payer: Medicaid Other | Admitting: Occupational Therapy

## 2013-06-11 LAB — GLUCOSE, CAPILLARY
GLUCOSE-CAPILLARY: 216 mg/dL — AB (ref 70–99)
GLUCOSE-CAPILLARY: 221 mg/dL — AB (ref 70–99)
GLUCOSE-CAPILLARY: 122 mg/dL — AB (ref 70–99)
GLUCOSE-CAPILLARY: 175 mg/dL — AB (ref 70–99)
Glucose-Capillary: 213 mg/dL — ABNORMAL HIGH (ref 70–99)

## 2013-06-11 NOTE — Patient Care Conference (Signed)
Inpatient RehabilitationTeam Conference and Plan of Care Update Date: 06/11/2013   Time: 11:55 Am    Patient Name: [name]      Medical Record Number: [id number]  Date of Birth: [birth year] Sex: Female         Room/Bed: 4M09C/4M09C-01 Payor Info: Payor: MEDICAID POTENTIAL / Plan: MEDICAID POTENTIAL / Product Type: *No Product type* /    Admitting Diagnosis: L MCA CVA  Admit Date/Time:  06/04/2013  2:30 PM Admission Comments: No comment available   Primary Diagnosis:  <principal problem not specified> Principal Problem: <principal problem not specified>  Patient Active Problem List   Diagnosis Date Noted  . CVA (cerebral infarction) 06/04/2013  . Dysphagia 06/02/2013  . Benign polycythemia 06/02/2013  . Malignant hypertension 05/28/2013  . DM (diabetes mellitus) type II controlled, neurological manifestation 05/27/2013  . Acute embolic stroke 63/14/9702  . 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 Hypertensive emergency 05/27/2013  . Stroke 05/27/2013  . Aphasia complicating stroke 63/78/5885    Expected Discharge Date: Expected Discharge Date: 07/02/13  Team Members Present: Physician leading conference: Dr. Alysia Penna Social Worker Present: Ovidio Kin, LCSW Nurse Present: Dorthula Nettles, RN PT Present: Raylene Everts, PT;Emily Rinaldo Cloud, PT OT Present: Simonne Come, Maryella Shivers, OT SLP Present: Gunnar Fusi, SLP PPS Coordinator present : Ileana Ladd, Lelan Pons, RN, CRRN     Current Status/Progress Goal Weekly Team Focus  Medical   R sided weakness unchanged, severe dysphagia  Adequate po  improve continence, address TF   Bowel/Bladder   incontinent of bladder and ,LBM 06/10/13  Bowel and bladder managed with min assist  Monitor elimination pattern, Timed toileting   Swallow/Nutrition/ Hydration   unbale to manamge oral secretions due to poor initiation of swallows; Total assist   Max assist to participcate in dysphagia therapy  increase initiation  in context of funcitonal tasks   ADL's   max assist bathing and dressing, decreased participation secondary to elevated BPs  min assist overall  activity tolerance, sitting and standing balance, transfers, ADL retraining   Mobility   when able to participate mod-max A overall  Supervision-min A overall  activity tolerance related to BP; transfers, standing, gait when appropriate   Communication   Total assist   Mod assist   increase initiation in context of funcitonal tasks; continue progress with object id in field fo 3   Safety/Cognition/ Behavioral Observations  Total assist   Mod assist   increase initiation in context of funcitonal tasks   Pain   No s/o pain   Pain <3  assess for and treat pain q shift   Skin   No skin issues  No skin breakdown  Assess skin q shift      *See Care Plan and progress notes for long and short-term goals.  Barriers to Discharge: see above    Possible Resolutions to Barriers:  PEG, ID caregiver for post D/C    Discharge Planning/Teaching Needs:  Family wants to take pt home, unsure if realizes how much care she will be and realistic, work with daughter and son and encourage to come in during therapies      Team Discussion:  BP issues better, timed toileting.  Initiated two swallows today. Not aware of right arm-possible need for PEG. Activity tolerance related to BP issues.  Will need to confirm with family discharge plans if realistic  Revisions to Treatment Plan:  Possible NHP   Continued Need for Acute Rehabilitation Level of Care: The patient  requires daily medical management by a physician with specialized training in physical medicine and rehabilitation for the following conditions: Daily direction of a multidisciplinary physical rehabilitation program to ensure safe treatment while eliciting the highest outcome that is of practical value to the patient.: Yes Daily medical management of patient stability for increased activity during  participation in an intensive rehabilitation regime.: Yes Daily analysis of laboratory values and/or radiology reports with any subsequent need for medication adjustment of medical intervention for : Neurological problems;Other  Shelsie Tijerino, [name] 06/11/2013, 4:11 PM

## 2013-06-11 NOTE — Progress Notes (Signed)
Speech Language Pathology Daily Session Note  Patient Details  Name: [name] MRN: [id number] Date of Birth: [birth year]  Today's Date: 06/11/2013 Time: 0810-0905 Time Calculation (min): 55 min  Short Term Goals: Week 1: SLP Short Term Goal 1 (Week 1): Patient will initiiate 5 swallows per session with Max multi-modal cues. SLP Short Term Goal 2 (Week 1): Patient will follow 1-step directions with Max multi-modal cues. SLP Short Term Goal 3 (Week 1): Patient will identify object from a field of 2 with Max mlulti-modal cues. SLP Short Term Goal 4 (Week 1): Patient will approximate speech sounds with Max assist multi-modal cues.  Skilled Therapeutic Interventions: Skilled treatment session focused on addressing dysphagia and cognitive-linguistic goals.  Patient followed 1-step directions during a functional task, oral care via suctioning with Max assist verbal, visual and tactile cues clinician cues.  Following oral care patient elicited 1 spontaneous swallow of oral secretions with no overt s/s of aspiration.   SLP also facilitated session with prep set of labeling and hand over hand assist to use 4 functional objects.  Patient then identified objects from a field of three with Max assist verbal and visual /demonstrative cues to achieve 42% accuracy.  SLP set-up a natural functional task of consuming ice from a cup to elicit a swallow.  Patient demonstrated increase oral manipulation of bolus with swallow initiation in 1-2 opportunities during trials.  Recommend to continue with current plan of care and ice chip trials via cup.      FIM:  Comprehension Comprehension Mode: Auditory Comprehension: 2-Understands basic 25 - 49% of the time/requires cueing 51 - 75% of the time Expression Expression Mode: Verbal Expression: 1-Expresses basis less than 25% of the time/requires cueing greater than 75% of the time. Social Interaction Social Interaction: 1-Interacts appropriately less than 25%  of the time. May be withdrawn or combative. Problem Solving Problem Solving: 1-Solves basic less than 25% of the time - needs direction nearly all the time or does not effectively solve problems and may need a restraint for safety Memory Memory: 2-Recognizes or recalls 25 - 49% of the time/requires cueing 51 - 75% of the time  Pain Pain Assessment Pain Assessment: No/denies pain  Therapy/Group: Individual Therapy  Carmelia Roller., CCC-SLP [phone]  Lino Lakes 06/11/2013, 2:40 PM

## 2013-06-11 NOTE — Progress Notes (Signed)
NUTRITION FOLLOW UP  Intervention:   1. Enteral nutrition; Continue Glucerna 1.2 @ 75 ml/hr x 20 hours daily. (Anticipate TF to be held 4 hours daily for participation in therapy.) Pt will not be transitioned to bolus feeds while on NGT feeds.  This is appropriate if short-term nutrition support is expected.  Question whether pt will end up needing long-term support.  G-tube would allow for transition to bolus feeds and may allow greater ROM with SLP. 2. Continue free water flushes. Pt will receive 1800 kcal, 90g protein, and 2065 ml free water daily.   NUTRITION DIAGNOSIS:  Inadequate oral intake related to inability to eat, dysphagia as evidenced by SLP assessment.   Monitor:  1. Enteral nutrition; initiation with tolerance. Pt to meet >/=90% estimated needs with nutrition support.  2. Wt/wt change; monitor trends  Assessment:   53 y.o. female with history of HTN, DM, bipolar disorder, who was admitted on 05/27/13 with right facial droop and aphasia. 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   Pt is tolerating TFs well. Denies nausea, abdominal pain or discomfort.  Per MD note, pt may require PEG if unable to progress with SLP.  Per SLP note, pt with tendencies to hold POs as well as characteristics of severe apraxia impacting communication and swallow skills.   Height: Ht Readings from Last 1 Encounters:  05/27/13 5\' 6"  (1.676 m)    Weight Status:   Wt Readings from Last 1 Encounters:  06/11/13 163 lb 2.3 oz (74 kg)   Re-estimated needs:  Kcal: 1800-1980 Protein: 70-85g Fluid: >1.8 L/day  Skin: intact  Diet Order: NPO   Intake/Output Summary (Last 24 hours) at  06/11/13 0940 Last data filed at 06/10/13 1200  Gross per 24 hour  Intake    200 ml  Output      0 ml  Net    200 ml    Last BM: 3/24   Labs:   Recent Labs Lab 06/05/13 0600  NA 141  K 4.4  CL 97  CO2 31  BUN 17  CREATININE 0.78  CALCIUM 10.3  GLUCOSE 277*    CBG (last 3)   Recent Labs  06/10/13 2342 06/11/13 0353 06/11/13 0747  GLUCAP 182* 213* 216*    Scheduled Meds: . aspirin  325 mg Per Tube Daily  . enoxaparin (LOVENOX) injection  30 mg Subcutaneous Q24H  . free water  200 mL Per Tube TID WC & HS  . insulin aspart  0-15 Units Subcutaneous 6 times per day  . insulin aspart  4 Units Subcutaneous 6 times per day  . insulin glargine  20 Units Subcutaneous BID  . labetalol  200 mg Per NG tube BID  . nystatin  5 mL Oral QID  . simvastatin  20 mg Per Tube q1800    Continuous Infusions: . feeding supplement (GLUCERNA 1.2 CAL) 1,000 mL (06/10/13 2022)    Brynda Greathouse, MS RD LDN Clinical Inpatient Dietitian Pager: [phone] Weekend/After hours pager: [phone]

## 2013-06-11 NOTE — Progress Notes (Signed)
Physical Therapy Session Note  Patient Details  Name: [name] MRN: [id number] Date of Birth: 21-Jul-1960  Today's Date: 06/11/2013 Time: 8110-3159 Time Calculation (min): 39 min  Short Term Goals: Week 1:  PT Short Term Goal 1 (Week 1): Pt will perform bed mobility to L and R with mod A and 75% cues for initiation and sequencing PT Short Term Goal 1 - Progress (Week 1): Met PT Short Term Goal 2 (Week 1): Pt will perform bed <> w/c transfers with mod A and 75% cues for initiation and sequencing PT Short Term Goal 2 - Progress (Week 1): Met PT Short Term Goal 3 (Week 1): Pt will perform w/c mobility x 50' on unit with L hemi technique and mod A with 75% cues for sequencing PT Short Term Goal 3 - Progress (Week 1): Partly met PT Short Term Goal 4 (Week 1): Pt will perform gait x 25' with LRAD and mod A  PT Short Term Goal 4 - Progress (Week 1): Partly met PT Short Term Goal 5 (Week 1): Pt will negotiate 3 stairs with one rail and max A and 75% cues for sequencing PT Short Term Goal 5 - Progress (Week 1): Partly met  Skilled Therapeutic Interventions/Progress Updates:   Attempted to see pt at regular scheduled time but secondary to elevated diastolic BP above parameter values pt returned to recliner to rest until therapist could return later in pm.  Upon arrival later in pm pt lying in bed.  Pt agreeable to get up with therapy.  BP assessed and below parameter values.  Transferred to EOB with mod-max A to roll to L and side > sit with total verbal and tactile cues for sequencing.  Performed stand pivot/step transfer bed <> w/c with RUE support and then with LUE support with max A for balance and weight shifting and assistance to advance RLE.  In gym performed NMR; see below for details. Returned to room and to bed with mod-max A stand pivot and sit > supine with min-mod A.    Therapy Documentation Precautions:  Precautions Precautions: Fall Precaution Comments: right hemiperesis, NPO  and maintain HOB >30 deg; MONITOR VITALS; Alert RN for BP >180/>105 and hold activity for BP 220/120, monitor for signs of aspiration or abdominal pain and distention Restrictions Weight Bearing Restrictions: No General: Amount of Missed PT Time (min): 21 Minutes Missed Time Reason: MD hold (comment) (elevated diastolic BP) Pain: Pain Assessment Pain Assessment: No/denies pain Locomotion : Ambulation Ambulation/Gait Assistance: 2: Max assist  Other Treatments: Treatments Neuromuscular Facilitation: Right;Lower Extremity;Forced use;Activity to increase coordination;Activity to increase motor control;Activity to increase timing and sequencing;Activity to increase sustained activation;Activity to increase lateral weight shifting;Activity to increase anterior-posterior weight shifting during stair negotiation up/down 5 stairs with LUE support on rail with focus on lateral and forwards weight shifting with step to sequencing for R lateral weight shift and acceptance on RLE during stance phase; also required assistance to fully advance, place and stabilize RLE.  During descent R adduction tone noted causing scissoring requiring +2 to descend safely.  Performed gait with LUE support on wall rail and max A of one therapist under RUE for trunk elongation and rotation to allow L lateral and forward weight shift to allow for pt to activate and advance RLE.  Pt able to fully advance RLE 80% of the time and required less assistance for R knee stabilization in stance.  Pt fatigued quickly and requested to return to room.  Will attempt again  tomorrow.     See FIM for current functional status  Therapy/Group: Individual Therapy  Raylene Everts [facility] 06/11/2013, 5:03 PM

## 2013-06-11 NOTE — Progress Notes (Signed)
Subjective/Complaints: 53 y.o. female with history of HTN, DM, bipolar disorder, who was admitted on 05/27/13 with right facial droop and aphasia. 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 2 D echo with EF 55-60%. Carotid dopplers without significant ICA stenosis. 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 Patient with progressive neurologic worsening on 05/29/13 pm with RUE >RLE weakness. Started on hypertonic fluids and transferred to ICU for closer monitoring. Follow up CCT without significant changes. Patient with decrease in LOC with worsening of symptoms  No new problems. bp's remain high, therapy being held for exceeding parameters, pt appears comfortable and asymptomatic but has limited expressive language abilities   Review of Systems - Negative except aphasic not able to consistently do Y/N Objective: Vital Signs: Blood pressure 153/65, pulse 93, temperature 98.4 F (36.9 C), temperature source Oral, resp. rate 18, weight 74 kg (163 lb 2.3 oz), SpO2 96.00%. No results found. Results for orders placed during the hospital encounter of 06/04/13 (from the past 72 hour(s))  GLUCOSE, CAPILLARY     Status: Abnormal   Collection Time    06/08/13 11:10 AM      Result Value Ref Range   Glucose-Capillary 292 (*) 70 - 99 mg/dL  GLUCOSE, CAPILLARY     Status: Abnormal   Collection Time    06/08/13  4:44 PM      Result Value Ref Range   Glucose-Capillary 247 (*) 70 - 99 mg/dL  GLUCOSE, CAPILLARY     Status: Abnormal   Collection Time    06/08/13  8:10 PM      Result Value Ref Range   Glucose-Capillary 234 (*) 70 - 99  mg/dL   Comment 1 Notify RN    GLUCOSE, CAPILLARY     Status: Abnormal   Collection Time    06/09/13 12:02 AM      Result Value Ref Range   Glucose-Capillary 274 (*) 70 - 99 mg/dL   Comment 1 Notify RN    GLUCOSE, CAPILLARY     Status: Abnormal   Collection Time    06/09/13  4:05 AM      Result Value Ref Range   Glucose-Capillary 289 (*) 70 - 99 mg/dL   Comment 1 Notify RN    GLUCOSE, CAPILLARY     Status: Abnormal   Collection Time    06/09/13  7:45 AM      Result Value Ref Range   Glucose-Capillary 317 (*) 70 - 99 mg/dL   Comment 1 Notify RN    GLUCOSE, CAPILLARY     Status: Abnormal   Collection Time    06/09/13 11:32 AM      Result Value Ref Range   Glucose-Capillary 293 (*) 70 - 99 mg/dL   Comment 1 Notify RN    GLUCOSE, CAPILLARY     Status: Abnormal   Collection Time    06/09/13  4:24 PM      Result Value Ref Range   Glucose-Capillary 390 (*) 70 - 99 mg/dL  GLUCOSE, CAPILLARY     Status: Abnormal   Collection Time    06/09/13  8:06 PM      Result Value Ref Range  Glucose-Capillary 299 (*) 70 - 99 mg/dL  GLUCOSE, CAPILLARY     Status: Abnormal   Collection Time    06/10/13 12:06 AM      Result Value Ref Range   Glucose-Capillary 250 (*) 70 - 99 mg/dL  GLUCOSE, CAPILLARY     Status: Abnormal   Collection Time    06/10/13  3:56 AM      Result Value Ref Range   Glucose-Capillary 273 (*) 70 - 99 mg/dL  GLUCOSE, CAPILLARY     Status: Abnormal   Collection Time    06/10/13  7:43 AM      Result Value Ref Range   Glucose-Capillary 260 (*) 70 - 99 mg/dL   Comment 1 Notify RN    GLUCOSE, CAPILLARY     Status: Abnormal   Collection Time    06/10/13 12:00 PM      Result Value Ref Range   Glucose-Capillary 275 (*) 70 - 99 mg/dL   Comment 1 Notify RN    GLUCOSE, CAPILLARY     Status: Abnormal   Collection Time    06/10/13  4:01 PM      Result Value Ref Range   Glucose-Capillary 268 (*) 70 - 99 mg/dL   Comment 1 Notify RN    GLUCOSE, CAPILLARY     Status:  Abnormal   Collection Time    06/10/13  7:58 PM      Result Value Ref Range   Glucose-Capillary 236 (*) 70 - 99 mg/dL  GLUCOSE, CAPILLARY     Status: Abnormal   Collection Time    06/10/13 11:42 PM      Result Value Ref Range   Glucose-Capillary 182 (*) 70 - 99 mg/dL  GLUCOSE, CAPILLARY     Status: Abnormal   Collection Time    06/11/13  3:53 AM      Result Value Ref Range   Glucose-Capillary 213 (*) 70 - 99 mg/dL  GLUCOSE, CAPILLARY     Status: Abnormal   Collection Time    06/11/13  7:47 AM      Result Value Ref Range   Glucose-Capillary 216 (*) 70 - 99 mg/dL   Comment 1 Notify RN       HEENT: normal and NG tube left nares, no drainage or skin breakdown Cardio: RRR and no murmur Resp: CTA B/L and unlabored GI: BS positive and non distended, Extremity:  Pulses positive and No Edema Skin:   Intact Neuro: alert today, Flat, Cranial Nerve Abnormalities R central 7, Abnormal Sensory  Abnormal Motor 0/5 RUE and RLE, normal on Left and Aphasic Opens mouth to command but does not protrude tongue, then perseverates on mouth opening when asked to move arm Musc/Skel:  Other no pain with R shoulder ROM Gen NAD   Assessment/Plan: 1. Functional deficits secondary to Left MCA infarct which require 3+ hours per day of interdisciplinary therapy in a comprehensive inpatient rehab setting. Physiatrist is providing close team supervision and 24 hour management of active medical problems listed below. Physiatrist and rehab team continue to assess barriers to discharge/monitor patient progress toward functional and medical goals. Team conference today please see physician documentation under team conference tab, met with team face-to-face to discuss problems,progress, and goals. Formulized individual treatment plan based on medical history, underlying problem and comorbidities. FIM: FIM - Bathing Bathing Steps Patient Completed: Chest;Abdomen;Right upper leg;Left upper leg Bathing: 2:  Max-Patient completes 3-4 [id number] parts or 25-49%  FIM - Upper Body Dressing/Undressing Upper body dressing/undressing  steps patient completed: Thread/unthread left sleeve of pullover shirt/dress;Put head through opening of pull over shirt/dress Upper body dressing/undressing: 2: Max-Patient completed 25-49% of tasks FIM - Lower Body Dressing/Undressing Lower body dressing/undressing steps patient completed: Thread/unthread left pants leg Lower body dressing/undressing: 1: Total-Patient completed less than 25% of tasks  FIM - Toileting Toileting: 0: Activity did not occur  FIM - Radio producer Devices: Recruitment consultant Transfers: 0-Activity did not occur  FIM - Control and instrumentation engineer Devices: Arm rests Bed/Chair Transfer: 3: Supine > Sit: Mod A (lifting assist/Pt. 50-74%/lift 2 legs;3: Sit > Supine: Mod A (lifting assist/Pt. 50-74%/lift 2 legs);3: Bed > Chair or W/C: Mod A (lift or lower assist);3: Chair or W/C > Bed: Mod A (lift or lower assist)  FIM - Locomotion: Wheelchair Distance: 100 Locomotion: Wheelchair: 2: Travels 50 - 149 ft with maximal assistance (Pt: 25 - 49%) FIM - Locomotion: Ambulation Ambulation/Gait Assistance: 1: +2 Total assist Locomotion: Ambulation: 1: Two helpers  Comprehension Comprehension Mode: Auditory Comprehension: 2-Understands basic 25 - 49% of the time/requires cueing 51 - 75% of the time  Expression Expression Mode: Nonverbal Expression: 1-Expresses basis less than 25% of the time/requires cueing greater than 75% of the time.  Social Interaction Social Interaction: 1-Interacts appropriately less than 25% of the time. May be withdrawn or combative.  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 DVT Prophylaxis/Anticoagulation: Pharmaceutical: Lovenox  2. Pain Management: N/A  3. Bipolar disorder/Mood: No signs of distress reported but patient with poor awareness of deficits. Monitor mood for now. LCSW to follow with patient and family.  4. Neuropsych: This patient is not capable of making decisions on her own behalf.  5. DM type 2: Will monitor with bid checks. Continue to titrate insulin upwards. . Use SSI for elevated BS.  6. HTN: adjust for better control avoiding hypotension with permissive high BP 180-200 range to prevent hypoperfusion/neuro worsening   -increased metoprolol yesterday without effect----change to labetalol today 7. H/o paranoid schizophrenia: Has been to [facility] in the past due to decompensation. Not on medications as this time--has been on respiridone in the past and has history of non-compliance per records  8. Dysphagia: NPO with panda TF. ?g-tube, d/w SLP would benefit from this 9. Resting Tachycardia: C -HR 87-119 range at rest.   On water flushes to avoid dehydration.   -BB will titrate dose, improving LOS (Days) 7 A FACE TO FACE EVALUATION WAS PERFORMED  KIRSTEINS,ANDREW E 06/11/2013, 8:08 AM

## 2013-06-11 NOTE — Progress Notes (Signed)
Physical Therapy Weekly Progress Note  Patient Details  Name: [name] MRN: [id number] Date of Birth: 11/10/1960  Today's Date: 06/11/2013 Time: 4098-1191 Time Calculation (min): 32 min  Patient has met 2 of 5 short term goals.  Pt is unable participate fully in therapy due to high BP with activity hold when above parameters, resulting in missed therapy treatment time. Pt is at mod A level overall for basic transfers and bed mobility and total A x 2 for gait and stair negotiation.    Patient continues to demonstrate the following deficits: R hemiplegia UE > LE, apraxia, impaired timing and sequencing, muscle activation, coordination and motor planning, postural control, balance, gait, cognition, decreased initiation, R inattention UE > LE, and decreased endurance, and therefore will continue to benefit from skilled PT intervention to enhance overall performance with activity tolerance, balance, postural control, ability to compensate for deficits, functional use of  right upper extremity and right lower extremity, attention, awareness and coordination.  Patient progressing toward long term goals..  Continue plan of care.  PT Short Term Goals Week 1:  PT Short Term Goal 1 (Week 1): Pt will perform bed mobility to L and R with mod A and 75% cues for initiation and sequencing PT Short Term Goal 1 - Progress (Week 1): Met PT Short Term Goal 2 (Week 1): Pt will perform bed <> w/c transfers with mod A and 75% cues for initiation and sequencing PT Short Term Goal 2 - Progress (Week 1): Met PT Short Term Goal 3 (Week 1): Pt will perform w/c mobility x 50' on unit with L hemi technique and mod A with 75% cues for sequencing PT Short Term Goal 3 - Progress (Week 1): Partly met PT Short Term Goal 4 (Week 1): Pt will perform gait x 25' with LRAD and mod A  PT Short Term Goal 4 - Progress (Week 1): Partly met PT Short Term Goal 5 (Week 1): Pt will negotiate 3 stairs with one rail and max A and 75%  cues for sequencing PT Short Term Goal 5 - Progress (Week 1): Partly met Week 2: PT Short Term Goal 1 (Week 2): Pt will perform bed mobility to L and R with min A and 50% cues for initiation and sequencing   PT Short Term Goal 2 (Week 2): Pt will perform bed <> w/c transfers with min A and 50% cues for initiation and sequencing   PT Short Term Goal 3 (Week 2): Pt will perform w/c mobility x 50' on unit with L hemi technique and mod A with 50% cues for sequencing     PT Short Term Goal 4 (Week 2): Pt will perform gait x 25' with LRAD and mod A  PT Short Term Goal 5 (Week 2): Pt will negotiate 3 stairs with one rail and max A x 1 and 50% cues for sequencing  Skilled Therapeutic Interventions/Progress Updates:   Pt sitting in recliner, denies pain with head nod. Pt performed stand pivot recliner <> w/c mod A and mod verbal cues for sequencing and initiation. Pt transported to gym in w/c and performed squat pivot transfer w/c > mat mod A and max verbal cues for sequencing and hand placement. Pt performed NMR seated edge of mat to increase sustained activation, increase motor control, increase timing and sequencing, and increase anterior-posterior weight shifting with B feet supported to prepare for squat pivot transfer. Pt reaching outside of BOS forward and to the L with focus on postural control,  active WB through BLEs, weight acceptance through RLE, and hip clearance with pt requiring mod A to complete task. Post-treatment, pt performed squat pivot mat > w/c with min A. Returned to room and to recliner mod A with visitors present.   Therapy Documentation Precautions:  Precautions Precautions: Fall Precaution Comments: right hemiperesis, NPO and maintain HOB >30 deg; MONITOR VITALS; Alert RN for BP >180/>105 and hold activity for BP 220/120, monitor for signs of aspiration or abdominal pain and distention Restrictions Weight Bearing Restrictions: No Vital Signs: Therapy Vitals Pulse Rate: 98 BP:  156/99 mmHg Patient Position, if appropriate: Sitting Oxygen Therapy SpO2: 96 % O2 Device: None (Room air) Pain: Pain Assessment Pain Assessment: No/denies pain  See FIM for current functional status  Therapy/Group: Individual Therapy  Laretta Alstrom 06/11/2013, 4:43 PM

## 2013-06-11 NOTE — Progress Notes (Signed)
Occupational Therapy Session Note  Patient Details  Name: [name] MRN: [id number] Date of Birth: 02-06-1961  Today's Date: 06/11/2013 Time: 0920-1010 Time Calculation (min): 50 min  Short Term Goals: Week 1:  OT Short Term Goal 1 (Week 1): Pt will complete bathing with mod assist OT Short Term Goal 2 (Week 1): Pt will complete UB dressing with mod assist OT Short Term Goal 3 (Week 1): Pt will complete LB dressing with mod assist OT Short Term Goal 4 (Week 1): Pt will complete toilet transfer with mod assist OT Short Term Goal 5 (Week 1): Pt will position RUE prior to transfers with min verbal cues  Skilled Therapeutic Interventions/Progress Updates:    Engaged in ADL retraining with focus on initiation and sequencing during self-care tasks of bathing and dressing.  Pt in bed upon arrival receiving meds from RN.  Perineal hygiene completed at bed level with focus on rolling, pt required mod assist to roll to Lt with manual facilitation/positioning of RLE and RUE to assist with rolling.  Pt with difficulty problem solving doffing shirt, requiring mod verbal and physical assist to problem solve and initiate.  Pt with Rt inattention/neglect of RUE with bathing, requiring verbal and tactile cues to initiate bathing of RUE.  Once RUE thread through shirt, pt able to problem solve donning rest of shirt and initiated threading LLE through pants.  Sit > stand min assist to pull up pants, requiring assist to pull over hips.  Stand pivot transfer mod assist to Lt to recliner, propped RUE on pillow in recliner and left with RN present to reconnect and flush NG tube.  Therapy Documentation Precautions:  Precautions Precautions: Fall Precaution Comments: right hemiperesis, NPO and maintain HOB >30 deg; MONITOR VITALS; Alert RN for BP >180/>105 and hold activity for BP 220/120, monitor for signs of aspiration or abdominal pain and distention Restrictions Weight Bearing Restrictions:  No General: General Amount of Missed OT Time (min): 10 Minutes RN care Vital Signs: Therapy Vitals Pulse Rate: 94 BP: 138/93 mmHg Patient Position, if appropriate: Sitting (after standing and transfer) Pain:  Pt with no c/o pain  See FIM for current functional status  Therapy/Group: Individual Therapy  Jenaro Souder, [place] 06/11/2013, 10:14 AM

## 2013-06-12 ENCOUNTER — Inpatient Hospital Stay (HOSPITAL_COMMUNITY): Payer: Medicaid Other | Admitting: Occupational Therapy

## 2013-06-12 ENCOUNTER — Inpatient Hospital Stay (HOSPITAL_COMMUNITY): Payer: Medicaid Other | Admitting: Speech Pathology

## 2013-06-12 ENCOUNTER — Inpatient Hospital Stay (HOSPITAL_COMMUNITY): Payer: Medicaid Other | Admitting: Physical Therapy

## 2013-06-12 DIAGNOSIS — F319 Bipolar disorder, unspecified: Secondary | ICD-10-CM

## 2013-06-12 DIAGNOSIS — I6340 Cerebral infarction due to embolism of unspecified cerebral artery: Secondary | ICD-10-CM

## 2013-06-12 DIAGNOSIS — E119 Type 2 diabetes mellitus without complications: Secondary | ICD-10-CM

## 2013-06-12 DIAGNOSIS — I10 Essential (primary) hypertension: Secondary | ICD-10-CM

## 2013-06-12 LAB — GLUCOSE, CAPILLARY
GLUCOSE-CAPILLARY: 179 mg/dL — AB (ref 70–99)
GLUCOSE-CAPILLARY: 194 mg/dL — AB (ref 70–99)
GLUCOSE-CAPILLARY: 214 mg/dL — AB (ref 70–99)
Glucose-Capillary: 222 mg/dL — ABNORMAL HIGH (ref 70–99)
Glucose-Capillary: 151 mg/dL — ABNORMAL HIGH (ref 70–99)
Glucose-Capillary: 161 mg/dL — ABNORMAL HIGH (ref 70–99)

## 2013-06-12 MED ORDER — LABETALOL HCL 200 MG PO TABS
200.0000 mg | ORAL_TABLET | Freq: Three times a day (TID) | ORAL | Status: DC
  Administered 2013-06-12 – 2013-06-20 (×25): 200 mg via NASOGASTRIC
  Filled 2013-06-12 (×31): qty 1

## 2013-06-12 NOTE — Progress Notes (Signed)
Social Work Patient ID: [name], female   DOB: 05-Mar-1961, 53 y.o.   MRN: [id number] Spoke with son-Kevin via telephone to discuss team conference goals-min level and discharge 4/15.  Discussed pt';s need for 24 hour care at home and  The services available.  Informed if felt could not manage her at home the option of NHP.  He really wants their family to work it out where she will go home form here. He states; " I do not an to put her in a nursing home."  Encouraged him to discuss with pt's sister's and come up with a care plan at home.  Also to attend therapies With pt, along with his sister, to begin seeing Mom's care needs.  Will work on formulating a discharge plan, unsure how realistic children are regarding pt's progress and care needs.

## 2013-06-12 NOTE — Progress Notes (Signed)
Occupational Therapy Weekly Progress Note  Patient Details  Name: [name] MRN: [id number] Date of Birth: 11/11/1960  Today's Date: 06/12/2013 Time: 1497-0263 and 1305-1330 Time Calculation (min): 40 min and 25 min  Patient has met 3 of 5 short term goals.  Pt is making slow, but steady progress towards goals.  Pt's participation is limited by elevated BP to which activity has to be held when above parameters, resulting in missed therapy time and modified treatments when able to participate.  Pt's progress is also limited by her aphasia and apraxia which impair her ability to fully participate in treatment sessions.  Pt is mod assist with basic transfers and familiar tasks of UB dressing.  Pt requires increased time and verbal cues for initiation and sequencing.  Pt with Rt inattention, neglect of RUE.  No active movement in RUE.  Patient continues to demonstrate the following deficits: RUE hemiplegia, apraxia, impaired timing and sequencing, muscle activation, coordination and motor planning, postural control, balance, cognition, decreased initiation, Rt inattention, and decreased endurance and therefore will continue to benefit from skilled OT intervention to enhance overall performance with BADL and Reduce care partner burden.  Patient progressing toward long term goals..  Continue plan of care.  OT Short Term Goals Week 1:  OT Short Term Goal 1 (Week 1): Pt will complete bathing with mod assist OT Short Term Goal 1 - Progress (Week 1): Met OT Short Term Goal 2 (Week 1): Pt will complete UB dressing with mod assist OT Short Term Goal 2 - Progress (Week 1): Met OT Short Term Goal 3 (Week 1): Pt will complete LB dressing with mod assist OT Short Term Goal 3 - Progress (Week 1): Progressing toward goal OT Short Term Goal 4 (Week 1): Pt will complete toilet transfer with mod assist OT Short Term Goal 4 - Progress (Week 1): Met OT Short Term Goal 5 (Week 1): Pt will position RUE prior  to transfers with min verbal cues OT Short Term Goal 5 - Progress (Week 1): Progressing toward goal Week 2:  OT Short Term Goal 1 (Week 2): Pt will complete bathing with min assist OT Short Term Goal 2 (Week 2): Pt will complete LB dressing with mod assist OT Short Term Goal 3 (Week 2): Pt will complete UB dressing with min assist OT Short Term Goal 4 (Week 2): Pt will position RUE prior to transfers with min verbal cues OT Short Term Goal 5 (Week 2): Pt will complete 1/3 toileting steps with mid verbal cues  Skilled Therapeutic Interventions/Progress Updates:    1) Engaged in ADL retraining with focus on initiation and sequencing during self-care tasks of bathing and dressing. Pt in bed upon arrival. Perineal hygiene completed at bed level with focus on rolling, pt required mod assist to roll to Lt with manual facilitation/positioning of RLE and RUE to assist with rolling with pt with increased initiation this session. Pt incontinent of bowel and bladder, requiring increased time to complete hygiene. Sit > stand min assist to pull up pants, requiring assist to pull over hips. Upon sitting EOB pt's BP 141/114, RN notified of parameters and to hold therapy at this point. Stand pivot transfer mod assist to Lt to recliner, propped RUE on pillow in recliner and left with all needs in reach.  RN to follow regarding BP.  2) Engaged in [place] re-ed with focus on increased attention to [place].  Pt received seated in recliner.  Engaged in [place] PROM in sitting with focus  on shoulder ROM and attention to Rt side. Encouraged visual scanning to Rt to touch RUE, shoulder ROM with flexion/extension, internal/external rotation, and abduction/adduction.  Pt reports pain in shoulder, with facial grimace, with abduction at approx 60 degrees.  Pt continues to have flaccid RUE, no subluxation, returned RUE to propped on pillow.  Pt with no awareness of loss of saliva on Rt side of mouth, had pt wipe mouth multiple times throughout  session with pt wiping cheek and missing spillage from mouth, requiring increased cues and demonstration.    Therapy Documentation Precautions:  Precautions Precautions: Fall Precaution Comments: right hemiperesis, NPO and maintain HOB >30 deg; MONITOR VITALS; Alert RN for BP >180/>105 and hold activity for BP 220/120, monitor for signs of aspiration or abdominal pain and distention Restrictions Weight Bearing Restrictions: No General: General Amount of Missed OT Time (min): 20 Minutes Vital Signs: Therapy Vitals Pulse Rate: 95 BP: 141/114 mmHg Patient Position, if appropriate: Sitting Pain: Pain Assessment Pain Assessment: No/denies pain  See FIM for current functional status  Therapy/Group: Individual Therapy  Simonne Come 06/12/2013, 11:31 AM

## 2013-06-12 NOTE — Progress Notes (Signed)
Speech Language Pathology Daily Session Note  Patient Details  Name: [name] MRN: [id number] Date of Birth: 1960/08/10  Today's Date: 06/12/2013 Time: 6063-0160 Time Calculation (min): 55 min  Short Term Goals: Week 1: SLP Short Term Goal 1 (Week 1): Patient will initiiate 5 swallows per session with Max multi-modal cues. SLP Short Term Goal 2 (Week 1): Patient will follow 1-step directions with Max multi-modal cues. SLP Short Term Goal 3 (Week 1): Patient will identify object from a field of 2 with Max mlulti-modal cues. SLP Short Term Goal 4 (Week 1): Patient will approximate speech sounds with Max assist multi-modal cues.  Skilled Therapeutic Interventions: Skilled treatment session focused on addressing dysphagia and cognitive-linguistic goals.  Patient followed 1-step directions with contextual of task and Mod assist verbal, visual and tactile cues during a basic, familiar functional task, oral care via suctioning.  SLP set-up a natural functional task of consuming ice from a cup to elicit a swallow.  Patient demonstrated increased oral manipulation of bolus with occasional mastication.  Patient initiated swallow x12 with 1-2 swallows per trial.  Patient demonstrated intermittent overt s/s of aspiration with strong reflexive cough.  SLP also facilitated session with verbal automatic sequences with melodic, tactile and visual cues.  Patient demonstrated increased and varied lingual and labial movement with Max cues.  Continue with current plan of care.   FIM:  Comprehension Comprehension Mode: Auditory Comprehension: 2-Understands basic 25 - 49% of the time/requires cueing 51 - 75% of the time Expression Expression Mode: Verbal Expression: 1-Expresses basis less than 25% of the time/requires cueing greater than 75% of the time. Social Interaction Social Interaction: 1-Interacts appropriately less than 25% of the time. May be withdrawn or combative. Problem Solving Problem  Solving: 1-Solves basic less than 25% of the time - needs direction nearly all the time or does not effectively solve problems and may need a restraint for safety Memory Memory: 2-Recognizes or recalls 25 - 49% of the time/requires cueing 51 - 75% of the time FIM - Eating Eating Activity: 2: Hand over hand assist (with trials)  Pain Pain Assessment Pain Assessment: No/denies pain  Therapy/Group: Individual Therapy  Carmelia Roller., CCC-SLP [phone]  [place] 06/12/2013, 10:06 AM

## 2013-06-12 NOTE — Progress Notes (Signed)
Physical Therapy Session Note  Patient Details  Name: [name] MRN: [id number] Date of Birth: October 01, 1960  Today's Date: 06/12/2013 Time: 6644-0347 Time Calculation (min): 60 min  Short Term Goals: Week 2:  PT Short Term Goal 1 (Week 2): Pt will perform bed mobility to L and R with min A and 50% cues for initiation and sequencing PT Short Term Goal 2 (Week 2): Pt will perform bed <> w/c transfers with min A and 50% cues for initiation and sequencing PT Short Term Goal 3 (Week 2): Pt will perform w/c mobility x 50' on unit with L hemi technique and mod A with 50% cues for sequencing PT Short Term Goal 4 (Week 2): Pt will perform gait x 25' with LRAD and mod A PT Short Term Goal 5 (Week 2): Pt will negotiate 3 stairs with one rail and max A x 1 and 50% cues for sequencing  Skilled Therapeutic Interventions/Progress Updates:   Pt resting in recliner.  Pt performing transfers recliner > w/c <> mat and w/c > bed squat pivot mod A with only slight input through R thigh/quad and mod A to lift fully from surface but pt demonstrating improved initiation and sequencing and initiating placement of LUE on arm rest.  In gym performed NMR standing balance and during gait.  Returned to room and to bed and supine with mod A.    Therapy Documentation Precautions:  Precautions Precautions: Fall Precaution Comments: right hemiperesis, NPO and maintain HOB >30 deg; MONITOR VITALS; Alert RN for BP >180/>105 and hold activity for BP 220/120, monitor for signs of aspiration or abdominal pain and distention Restrictions Weight Bearing Restrictions: No Vital Signs: Therapy Vitals Pulse Rate: 105 BP: 130/100 mmHg Patient Position, if appropriate: Sitting Oxygen Therapy SpO2: 100 % O2 Device: None (Room air) Pain: Pain Assessment Pain Assessment: No/denies pain Locomotion : Ambulation Ambulation/Gait Assistance: 2: Max assist  Other Treatments: Treatments Neuromuscular Facilitation: Right;Lower  Extremity;Forced use;Activity to increase coordination;Activity to increase motor control;Activity to increase timing and sequencing;Activity to increase sustained activation;Activity to increase lateral weight shifting;Activity to increase anterior-posterior weight shifting during standing balance and weight shift training with LUE support on back of w/c while performing L and R lateral weight shifts and alternating L and R foot taps to 4" step.  Pt able to clear advance and clear RLE 50% and required mod A from PT to fully clear step.  During L foot taps pt demonstrated very poor trunk control with increased R lateropulsion and trunk flexion with increased LUE shoulder elevation and extension.  Changed to pt holding back of chair but pt continued to demonstrate trunk flexion during R stance.  Changed again to 3 musketeers during L foot tapping to facilitate upright trunk control and activation of R glute and quad in stance.  Changed to gait in hallway with LUE support on wall rail x 25' forwards and then 25' backwards with ace wrap on R ankle for DF assist with max A with therapist under RUE for trunk control through facilitation of L lateral weight shift and trunk elongation to allow initiation of R swing and advancement.  Pt continues to require assistance for full advancement, placement and stabilization in stance.  During retro stepping pt presented with resistance to backwards weight shifting and required increased assistance for RLE stabilization, trunk control and weight shifting in all directions.  Returned to w/c; pt fatigued and less engaged in retro stepping.    See FIM for current functional status  Therapy/Group:  Individual Therapy  [name] [facility] 06/12/2013, 4:14 PM

## 2013-06-12 NOTE — Progress Notes (Signed)
Social Work Patient ID: [name], female   DOB: Dec 08, 1960, 53 y.o.   MRN: [id number] Message left for [facility] regarding pt's disability application.  Have given him pt's son's name and number so this can be begun. According to son no one has contacted either him or his sister.  Will follow up on and await return call.

## 2013-06-12 NOTE — Progress Notes (Signed)
Subjective/Complaints: 53 y.o. female with history of HTN, DM, bipolar disorder, who was admitted on 05/27/13 with right facial droop and aphasia. 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 2 D echo with EF 55-60%. Carotid dopplers without significant ICA stenosis. 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 Patient with progressive neurologic worsening on 05/29/13 pm with RUE >RLE weakness. Started on hypertonic fluids and transferred to ICU for closer monitoring. Follow up CCT without significant changes. Patient with decrease in LOC with worsening of symptoms  No new problems. bp's remain high, therapy being held for exceeding parameters, pt appears comfortable and asymptomatic but has limited expressive language abilities   Review of Systems - Negative except aphasic not able to consistently do Y/N Objective: Vital Signs: Blood pressure 172/100, pulse 118, temperature 98.4 F (36.9 C), temperature source Oral, resp. rate 19, weight 75.4 kg (166 lb 3.6 oz), SpO2 99.00%. No results found. Results for orders placed during the hospital encounter of 06/04/13 (from the past 72 hour(s))  GLUCOSE, CAPILLARY     Status: Abnormal   Collection Time    06/09/13  7:45 AM      Result Value Ref Range   Glucose-Capillary 317 (*) 70 - 99 mg/dL   Comment 1 Notify RN    GLUCOSE, CAPILLARY     Status: Abnormal   Collection Time    06/09/13 11:32 AM      Result Value Ref Range   Glucose-Capillary 293 (*) 70 - 99 mg/dL   Comment 1 Notify RN    GLUCOSE, CAPILLARY     Status: Abnormal   Collection Time    06/09/13  4:24 PM      Result  Value Ref Range   Glucose-Capillary 390 (*) 70 - 99 mg/dL  GLUCOSE, CAPILLARY     Status: Abnormal   Collection Time    06/09/13  8:06 PM      Result Value Ref Range   Glucose-Capillary 299 (*) 70 - 99 mg/dL  GLUCOSE, CAPILLARY     Status: Abnormal   Collection Time    06/10/13 12:06 AM      Result Value Ref Range   Glucose-Capillary 250 (*) 70 - 99 mg/dL  GLUCOSE, CAPILLARY     Status: Abnormal   Collection Time    06/10/13  3:56 AM      Result Value Ref Range   Glucose-Capillary 273 (*) 70 - 99 mg/dL  GLUCOSE, CAPILLARY     Status: Abnormal   Collection Time    06/10/13  7:43 AM      Result Value Ref Range   Glucose-Capillary 260 (*) 70 - 99 mg/dL   Comment 1 Notify RN    GLUCOSE, CAPILLARY     Status: Abnormal   Collection Time    06/10/13 12:00 PM      Result Value Ref Range   Glucose-Capillary 275 (*) 70 - 99 mg/dL   Comment 1 Notify RN    GLUCOSE, CAPILLARY     Status: Abnormal   Collection Time    06/10/13  4:01 PM      Result Value Ref Range   Glucose-Capillary 268 (*) 70 - 99 mg/dL  Comment 1 Notify RN    GLUCOSE, CAPILLARY     Status: Abnormal   Collection Time    06/10/13  7:58 PM      Result Value Ref Range   Glucose-Capillary 236 (*) 70 - 99 mg/dL  GLUCOSE, CAPILLARY     Status: Abnormal   Collection Time    06/10/13 11:42 PM      Result Value Ref Range   Glucose-Capillary 182 (*) 70 - 99 mg/dL  GLUCOSE, CAPILLARY     Status: Abnormal   Collection Time    06/11/13  3:53 AM      Result Value Ref Range   Glucose-Capillary 213 (*) 70 - 99 mg/dL  GLUCOSE, CAPILLARY     Status: Abnormal   Collection Time    06/11/13  7:47 AM      Result Value Ref Range   Glucose-Capillary 216 (*) 70 - 99 mg/dL   Comment 1 Notify RN    GLUCOSE, CAPILLARY     Status: Abnormal   Collection Time    06/11/13 11:37 AM      Result Value Ref Range   Glucose-Capillary 221 (*) 70 - 99 mg/dL   Comment 1 Notify RN    GLUCOSE, CAPILLARY     Status: Abnormal   Collection Time     06/11/13  4:41 PM      Result Value Ref Range   Glucose-Capillary 122 (*) 70 - 99 mg/dL  GLUCOSE, CAPILLARY     Status: Abnormal   Collection Time    06/11/13  8:30 PM      Result Value Ref Range   Glucose-Capillary 175 (*) 70 - 99 mg/dL  GLUCOSE, CAPILLARY     Status: Abnormal   Collection Time    06/12/13 12:04 AM      Result Value Ref Range   Glucose-Capillary 194 (*) 70 - 99 mg/dL   Comment 1 Notify RN    GLUCOSE, CAPILLARY     Status: Abnormal   Collection Time    06/12/13  4:14 AM      Result Value Ref Range   Glucose-Capillary 179 (*) 70 - 99 mg/dL   Comment 1 Notify RN       HEENT: normal and NG tube left nares, no drainage or skin breakdown Cardio: RRR and no murmur Resp: CTA B/L and unlabored GI: BS positive and non distended, Extremity:  Pulses positive and No Edema Skin:   Intact Neuro: alert today, Flat, Cranial Nerve Abnormalities R central 7, Abnormal Sensory  Abnormal Motor 0/5 RUE and RLE except trace knee ext, normal on Left and Aphasic Opens mouth to command but does not protrude tongue, then perseverates on mouth opening when asked to move arm Musc/Skel:  Other no pain with R shoulder ROM Gen NAD   Assessment/Plan: 1. Functional deficits secondary to Left MCA infarct which require 3+ hours per day of interdisciplinary therapy in a comprehensive inpatient rehab setting. Physiatrist is providing close team supervision and 24 hour management of active medical problems listed below. Physiatrist and rehab team continue to assess barriers to discharge/monitor patient progress toward functional and medical goals.  FIM: FIM - Bathing Bathing Steps Patient Completed: Chest;Abdomen;Right upper leg;Left upper leg;Right Arm Bathing: 3: Mod-Patient completes 5-7 63f 10 parts or 50-74%  FIM - Upper Body Dressing/Undressing Upper body dressing/undressing steps patient completed: Thread/unthread left sleeve of pullover shirt/dress;Put head through opening of pull  over shirt/dress Upper body dressing/undressing: 3: Mod-Patient completed 50-74% of tasks FIM -  Lower Body Dressing/Undressing Lower body dressing/undressing steps patient completed: Thread/unthread left pants leg Lower body dressing/undressing: 1: Total-Patient completed less than 25% of tasks  FIM - Toileting Toileting: 0: Activity did not occur  FIM - Radio producer Devices: Recruitment consultant Transfers: 0-Activity did not occur  FIM - Control and instrumentation engineer Devices: Arm rests Bed/Chair Transfer: 4: Supine > Sit: Min A (steadying Pt. > 75%/lift 1 leg);3: Sit > Supine: Mod A (lifting assist/Pt. 50-74%/lift 2 legs);3: Bed > Chair or W/C: Mod A (lift or lower assist);3: Chair or W/C > Bed: Mod A (lift or lower assist)  FIM - Locomotion: Wheelchair Distance: 100 Locomotion: Wheelchair: 1: Total Assistance/staff pushes wheelchair (Pt<25%) FIM - Locomotion: Ambulation Ambulation/Gait Assistance: 2: Max assist Locomotion: Ambulation: 1: Travels less than 50 ft with maximal assistance (Pt: 25 - 49%)  Comprehension Comprehension Mode: Auditory Comprehension: 2-Understands basic 25 - 49% of the time/requires cueing 51 - 75% of the time  Expression Expression Mode: Verbal Expression: 1-Expresses basis less than 25% of the time/requires cueing greater than 75% of the time.  Social Interaction Social Interaction: 1-Interacts appropriately less than 25% of the time. May be withdrawn or combative.  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 DVT Prophylaxis/Anticoagulation:  Pharmaceutical: Lovenox  2. Pain Management: N/A  3. Bipolar disorder/Mood: No signs of distress reported but patient with poor awareness of deficits. Monitor mood for now. LCSW to follow with patient and family.  4. Neuropsych: This patient is not capable of making decisions on her own behalf.  5. DM type 2: Will monitor with bid checks. Continue to titrate insulin upwards. . Use SSI for elevated BS.  6. HTN: adjust for better control avoiding hypotension with permissive high BP 180-200 range to prevent hypoperfusion/neuro worsening   -increased metoprolol yesterday without effect----change to labetalol today 7. H/o paranoid schizophrenia: Has been to [facility] in the past due to decompensation. Not on medications as this time--has been on respiridone in the past and has history of non-compliance per records  8. Dysphagia: NPO with panda TF. rec g-tube, d/w SLP would benefit from this, pt unable to give consent will d/w children 9. Resting Tachycardia: C -HR 96-118 range at rest.   On water flushes to avoid dehydration.   -BB will titrate dose, improving LOS (Days) 8 A FACE TO FACE EVALUATION WAS PERFORMED  Talor Cheema E 06/12/2013, 7:05 AM

## 2013-06-13 ENCOUNTER — Inpatient Hospital Stay (HOSPITAL_COMMUNITY): Payer: Medicaid Other | Admitting: Occupational Therapy

## 2013-06-13 ENCOUNTER — Inpatient Hospital Stay (HOSPITAL_COMMUNITY): Payer: Medicaid Other | Admitting: Physical Therapy

## 2013-06-13 ENCOUNTER — Inpatient Hospital Stay (HOSPITAL_COMMUNITY): Payer: Medicaid Other | Admitting: Speech Pathology

## 2013-06-13 DIAGNOSIS — F319 Bipolar disorder, unspecified: Secondary | ICD-10-CM

## 2013-06-13 DIAGNOSIS — I10 Essential (primary) hypertension: Secondary | ICD-10-CM

## 2013-06-13 DIAGNOSIS — I6340 Cerebral infarction due to embolism of unspecified cerebral artery: Secondary | ICD-10-CM

## 2013-06-13 DIAGNOSIS — E119 Type 2 diabetes mellitus without complications: Secondary | ICD-10-CM

## 2013-06-13 LAB — GLUCOSE, CAPILLARY
GLUCOSE-CAPILLARY: 194 mg/dL — AB (ref 70–99)
GLUCOSE-CAPILLARY: 200 mg/dL — AB (ref 70–99)
GLUCOSE-CAPILLARY: 88 mg/dL (ref 70–99)
Glucose-Capillary: 136 mg/dL — ABNORMAL HIGH (ref 70–99)
Glucose-Capillary: 140 mg/dL — ABNORMAL HIGH (ref 70–99)
Glucose-Capillary: 145 mg/dL — ABNORMAL HIGH (ref 70–99)
Glucose-Capillary: 162 mg/dL — ABNORMAL HIGH (ref 70–99)

## 2013-06-13 MED ORDER — ENOXAPARIN SODIUM 30 MG/0.3ML ~~LOC~~ SOLN
30.0000 mg | SUBCUTANEOUS | Status: AC
  Administered 2013-06-13 – 2013-06-15 (×3): 30 mg via SUBCUTANEOUS
  Filled 2013-06-13 (×3): qty 0.3

## 2013-06-13 MED ORDER — CEFAZOLIN SODIUM-DEXTROSE 2-3 GM-% IV SOLR
2.0000 g | INTRAVENOUS | Status: AC
  Administered 2013-06-16: 2 g via INTRAVENOUS
  Filled 2013-06-13 (×2): qty 50

## 2013-06-13 MED ORDER — ENOXAPARIN SODIUM 30 MG/0.3ML ~~LOC~~ SOLN
30.0000 mg | SUBCUTANEOUS | Status: DC
  Administered 2013-06-17 – 2013-07-07 (×21): 30 mg via SUBCUTANEOUS
  Filled 2013-06-13 (×22): qty 0.3

## 2013-06-13 NOTE — Progress Notes (Signed)
Patient with global aphasia with apraxia and dysphagia. Discussed need for PEG tube for nutritional support with patient's son. He's agreeable to procedure. Will consult IR for placement.

## 2013-06-13 NOTE — Progress Notes (Signed)
Patient ID: [name], female   DOB: [birth year], 53 y.o.   MRN: [id number] Request received from Rehab service for percutaneous gastrostomy tube placement in pt with hx of left MCA CVA, global aphasia, apraxia, dysphagia/malnutrition. Additional PMH as below. Recent abd film has been reviewed by Dr. Anselm Pancoast. Exam: pt awake/follows commands, aphasic, no right sided movement, sl weakness of grip strength on right; NG tube in place; chest- CTA bilat; heart- RRR; abd- soft,+BS,NT; ext- no edema.   Filed Vitals:   06/12/13 1700 06/13/13 0431 06/13/13 0554 06/13/13 1048  BP: 151/98  162/105 141/83  Pulse: 99  91 95  Temp: 98.5 F (36.9 C)  98.2 F (36.8 C)   TempSrc: Oral  Oral   Resp: 17  16   Weight:  159 lb 9.8 oz (72.4 kg)    SpO2: 97%  98%    Past Medical History  Diagnosis Date  . Hypertension   . Depression   . Diabetes mellitus     ?type (05/29/2013)  . Bipolar affective disorder   . Schizophrenia     [name] 11/28/2007 (05/29/2013)  . Stroke 05/27/2013   Past Surgical History  Procedure Laterality Date  . Cesarean section  ~ 1987  . Tubal ligation     Ct Angio Head W/cm &/or Wo Cm  05/30/2013   CLINICAL DATA:  Stroke.  EXAM: CT ANGIOGRAPHY HEAD  TECHNIQUE: Multidetector CT imaging of the head was performed using the standard protocol during bolus administration of intravenous contrast. Multiplanar CT image reconstructions and MIPs were obtained to evaluate the vascular anatomy.  CONTRAST:  63mL OMNIPAQUE IOHEXOL 350 MG/ML SOLN  COMPARISON:  Head CT 05/29/2013.  MRI 05/27/2013.  FINDINGS: Head CT images show areas of low-density within the left frontoparietal region, basal ganglia and insular region consistent with recent infarctions. There is low density in the pons that appears more prominent than was seen previously and there could be recent brainstem insult. No hemorrhage or herniation.  Both internal carotid arteries are patent through the skullbase. There is atherosclerotic  disease in the carotid siphon regions but no flow-limiting stenosis. Both supra clinoid internal carotid arteries show moderate narrowing, 30-50%.  On the right, the anterior and middle cerebral arteries are patent without proximal stenosis. Distal branch vessels appear normally opacified. On the left, there is normal appearing flow within the left anterior cerebral artery territory. There is severe stenosis in the M1 segment on the left with markedly diminished opacification of the more distal left MCA branch vessels.  Both vertebral arteries are patent to the basilar. No basilar stenosis. Posterior circulation branch vessels are patent. There is atherosclerotic irregularity of the posterior cerebral arteries and superior cerebellar arteries.  Review of the MIP images confirms the above findings.  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 No other intracranial flow-limiting stenosis or embolic disease evident.  Areas of low density in the left MCA territory consistent with infarction. No hemorrhagic transformation. Question new low density in the pons, not definite.   Electronically Signed   By: Nelson Chimes M.D.   On: 05/30/2013 12:19   Dg Chest 2 View  05/27/2013   CLINICAL DATA Stroke symptoms, difficulty with speech.  EXAM CHEST  2 VIEW  COMPARISON 11/15/2006  FINDINGS Mild aortic tortuosity. Normal heart size. No confluent airspace opacity. No pleural effusion or pneumothorax. Mild multilevel degenerative change without acute osseous finding.  IMPRESSION  No radiographic evidence of an acute cardiopulmonary process.  SIGNATURE  Electronically Signed   By: Carlos Levering M.D.   On: 05/27/2013 22:33   Dg Abd 1 View  05/30/2013   CLINICAL DATA:  NG tube placement  EXAM: ABDOMEN - 1 VIEW  COMPARISON:  None.  FINDINGS: The bowel gas pattern is normal. Residual contrast is identified in the  renal calices. A nasogastric tube is identified coiled in the stomach with distal tip in the mid esophagus. Repositioning is recommended  IMPRESSION: A nasogastric tube is identified coiled in the stomach with distal tip in the mid esophagus. Repositioning is recommended   Electronically Signed   By: Abelardo Diesel M.D.   On: 05/30/2013 13:24   Ct Head Wo Contrast  05/29/2013   CLINICAL DATA:  Worsening headache.  EXAM: CT HEAD WITHOUT CONTRAST  TECHNIQUE: Contiguous axial images were obtained from the base of the skull through the vertex without intravenous contrast.  COMPARISON:  CT scan of May 29, 2013.  FINDINGS: Bony calvarium appears intact. No midline shift is noted. Ventricular size is within normal limits. 15 x 12 mm rounded low density is noted in left basal ganglia consistent with infarction. 22 x 20 mm rounded low density is noted in left periventricular white matter consistent with infarction. Stable left frontal lobe density is noted as well. These are unchanged compared to prior exam. There is no evidence of hemorrhage or mass lesion.  IMPRESSION: Stable left-sided infarctions are noted. No significant changes noted compared to prior exam.   Electronically Signed   By: Sabino Dick M.D.   On: 05/29/2013 20:22   Ct Head Wo Contrast  05/29/2013   CLINICAL DATA:  Right-sided weakness  EXAM: CT HEAD WITHOUT CONTRAST  TECHNIQUE: Contiguous axial images were obtained from the base of the skull through the vertex without intravenous contrast.  COMPARISON:  MR HEAD W/O CM dated 05/27/2013; MR MRA HEAD W/O CM dated 05/27/2013; CT HEAD W/O CM dated 05/27/2013; CT HEAD W/O CM dated 10/04/2009  FINDINGS: There is no evidence of mass effect, midline shift or extra-axial fluid collections. There is no evidence of a space-occupying lesion or intracranial hemorrhage. There are areas of low attenuation in the left frontal lobe, left basal ganglia, left insula and small foci in the left parietal lobe consistent with  multiple infarcts.  The ventricles and sulci are appropriate for the patient's age. The basal cisterns are patent.  Visualized portions of the orbits are unremarkable. The visualized portions of the paranasal sinuses and mastoid air cells are unremarkable.  The osseous structures are unremarkable.  IMPRESSION: Involving multifocal left MCA territory nonhemorrhagic infarcts. The area of low-attenuation the left basal ganglia and left insula are larger compared with prior exams.   Electronically Signed   By: Kathreen Devoid   On: 05/29/2013 13:57   Ct Head (brain) Wo Contrast  05/27/2013   CLINICAL DATA Slurred speech, right facial droop  EXAM CT HEAD WITHOUT CONTRAST  TECHNIQUE Contiguous axial images were obtained from the base of the skull through the vertex without intravenous contrast.  COMPARISON CT HEAD W/O CM dated 10/04/2009; CT HEAD W/O CM dated 02/02/2007; CT HEAD W/O CM dated 11/15/2006  FINDINGS There is no evidence of mass effect, midline shift, or extra-axial fluid collections. There is no evidence of a space-occupying lesion or intracranial hemorrhage. There is no evidence of a cortical-based area of acute infarction. There is periventricular white matter low attenuation likely secondary to microangiopathy.  The ventricles  and sulci are appropriate for the patient's age. The basal cisterns are patent.  Visualized portions of the orbits are unremarkable. The visualized portions of the paranasal sinuses and mastoid air cells are unremarkable.  The osseous structures are unremarkable.  IMPRESSION No acute intracranial pathology.  SIGNATURE  Electronically Signed   By: Kathreen Devoid   On: 05/27/2013 18:19   Mr Angiogram Head Wo Contrast  05/27/2013   CLINICAL DATA Right facial droop, slurred speech.  EXAM MRI HEAD WITHOUT CONTRAST  MRA HEAD WITHOUT CONTRAST  TECHNIQUE Multiplanar, multiecho pulse sequences of the brain and surrounding structures were obtained without intravenous contrast. Angiographic  images of the head were obtained using MRA technique without contrast.  COMPARISON CT HEAD W/O CM dated 05/27/2013; CT HEAD W/O CM dated 10/04/2009  FINDINGS MRI HEAD FINDINGS  Multiple foci of reduced diffusion in the left frontal temporal lobe, including 16 x 14 mm lesion and left corona radiata, 20 x 10 mm lesion in the high left frontal lobe at the gray-white matter junction. In addition, punctate focus of reduced diffusion within the right medial thalamus, and left occipital lobe. All foci show low ADC values in very mild FLAIR hyperintense signal, all appearing of similar age. No susceptibility artifact to suggest hemorrhage.  The ventricles and sulci are overall normal for patient's age. Scattered patchy supratentorial white matter T2 hyperintensities are far greater than expected for age, many which appear to radiate from the periventricular margin. In addition, symmetric homogenous bright T2 signal within the bilateral lenticulostriate nuclei, axial 13 /22. Patchy bright signal within the pons. However there is confluent periventricular white matter FLAIR T2 hyperintensity about the temporal horns and occipital horns. No midline shift.  No abnormal extra-axial fluid collections. Ocular globes and orbital contents are nonsuspicious though not tailored for evaluation. No abnormal sellar expansion or cerebellar tonsillar ectopia. The visualized paranasal sinuses and mastoid air cells are appear well aerated.  MRA HEAD FINDINGS  Anterior circulation: Normal flow related enhancement of the included cervical, petrous, cavernous and supra clinoid internal carotid arteries. Patent anterior communicating artery. Normal flow related enhancement of the anterior cerebral arteries, including more distal segments. Abrupt cut off of the left M1 segment, with absent distal left middle cerebral artery vessels. Patent right middle cerebral artery and distal segments, however there is somewhat distal segment luminal  irregularity.  Posterior circulation: Left Vertebral artery is dominant. Basilar artery is patent, with normal flow related enhancement of the main branch vessels. Normal flow related enhancement of the posterior cerebral arteries centrally, however there is relative paucity of right P3 branches compared to the left. Mildly irregular P2 segments, to lesser extent left P3 segment.  No hemodynamically significant stenosis, aneurysm within the anterior nor posterior circulation.  IMPRESSION MRI head: Multiple foci of acute ischemia within the left middle cerebral artery territory. In addition, however punctate focus of acute ischemia within the left occipital lobe and right thalamus (both of which would be posterior circulation). All areas of ischemia have similar acuity. No hemorrhagic conversion.  Symmetric abnormal bright signal within the lenticulostriate nuclei (basal ganglia) in addition to abnormal temporoccipital periventricular T2 hyperintense signal. These findings may reflect sequelae of acute severe hypertension (PRES) but, it can also be seen with osmotic demyelination, autoimmune disorders such as scleroderma (which can have associated vasculopathy), less likely infectious encephalitis.  Moderate to severe white matter changes, in a distribution which can be seen with demyelination, with superimposed possible chronic small vessel ischemic disease though, are nonspecific.  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  Mild luminal irregularity of the intracranial vessels which can be seen with intracranial atherosclerosis though, may also be seen with vasculopathy.  Critical Value/emergent results were called by telephone at the time of interpretation on 05/27/2013 at 10:28 PM to Dr. Susy Frizzle , who verbally acknowledged these results.  SIGNATURE  Electronically Signed   By: Awilda Metro   On: 05/27/2013 22:32   [name] Contrast  05/27/2013   CLINICAL DATA Right facial droop,  slurred speech.  EXAM MRI HEAD WITHOUT CONTRAST  MRA HEAD WITHOUT CONTRAST  TECHNIQUE Multiplanar, multiecho pulse sequences of the brain and surrounding structures were obtained without intravenous contrast. Angiographic images of the head were obtained using MRA technique without contrast.  COMPARISON CT HEAD W/O CM dated 05/27/2013; CT HEAD W/O CM dated 10/04/2009  FINDINGS MRI HEAD FINDINGS  Multiple foci of reduced diffusion in the left frontal temporal lobe, including 16 x 14 mm lesion and left corona radiata, 20 x 10 mm lesion in the high left frontal lobe at the gray-white matter junction. In addition, punctate focus of reduced diffusion within the right medial thalamus, and left occipital lobe. All foci show low ADC values in very mild FLAIR hyperintense signal, all appearing of similar age. No susceptibility artifact to suggest hemorrhage.  The ventricles and sulci are overall normal for patient's age. Scattered patchy supratentorial white matter T2 hyperintensities are far greater than expected for age, many which appear to radiate from the periventricular margin. In addition, symmetric homogenous bright T2 signal within the bilateral lenticulostriate nuclei, axial 13 /22. Patchy bright signal within the pons. However there is confluent periventricular white matter FLAIR T2 hyperintensity about the temporal horns and occipital horns. No midline shift.  No abnormal extra-axial fluid collections. Ocular globes and orbital contents are nonsuspicious though not tailored for evaluation. No abnormal sellar expansion or cerebellar tonsillar ectopia. The visualized paranasal sinuses and mastoid air cells are appear well aerated.  MRA HEAD FINDINGS  Anterior circulation: Normal flow related enhancement of the included cervical, petrous, cavernous and supra clinoid internal carotid arteries. Patent anterior communicating artery. Normal flow related enhancement of the anterior cerebral arteries, including more distal  segments. Abrupt cut off of the left M1 segment, with absent distal left middle cerebral artery vessels. Patent right middle cerebral artery and distal segments, however there is somewhat distal segment luminal irregularity.  Posterior circulation: Left Vertebral artery is dominant. Basilar artery is patent, with normal flow related enhancement of the main branch vessels. Normal flow related enhancement of the posterior cerebral arteries centrally, however there is relative paucity of right P3 branches compared to the left. Mildly irregular P2 segments, to lesser extent left P3 segment.  No hemodynamically significant stenosis, aneurysm within the anterior nor posterior circulation.  IMPRESSION MRI head: Multiple foci of acute ischemia within the left middle cerebral artery territory. In addition, however punctate focus of acute ischemia within the left occipital lobe and right thalamus (both of which would be posterior circulation). All areas of ischemia have similar acuity. No hemorrhagic conversion.  Symmetric abnormal bright signal within the lenticulostriate nuclei (basal ganglia) in addition to abnormal temporoccipital periventricular T2 hyperintense signal. These findings may reflect sequelae of acute severe hypertension (PRES) but, it can also be seen with osmotic demyelination, autoimmune disorders such as scleroderma (which can have associated vasculopathy), less likely infectious encephalitis.  Moderate to severe white matter changes, in a distribution which can be seen with demyelination, with  superimposed possible chronic small vessel ischemic disease though, are nonspecific.  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  Mild luminal irregularity of the intracranial vessels which can be seen with intracranial atherosclerosis though, may also be seen with vasculopathy.  Critical Value/emergent results were called by telephone at the time of interpretation on 05/27/2013 at 10:28 PM to Dr.  Calvert Cantor , who verbally acknowledged these results.  SIGNATURE  Electronically Signed   By: Elon Alas   On: 05/27/2013 22:32   Dg Abd Portable 1v  06/03/2013   CLINICAL DATA:  NG tube placement.  EXAM: PORTABLE ABDOMEN - 1 VIEW  COMPARISON:  DG ABD 1 VIEW dated 05/30/2013  FINDINGS: NG tube coiled in stomach. Gas pattern is nonspecific. Calcific densities right upper quadrant consistent with gallstones. Confirmation with ultrasound can be performed.  IMPRESSION: 1. Calcific densities right upper quadrant consistent with gallstones . 2. NG tube coiled in stomach.   Electronically Signed   By: Marcello Moores  Register   On: 06/03/2013 20:34  Results for orders placed during the hospital encounter of 06/04/13  URINE CULTURE      Result Value Ref Range   Specimen Description URINE, CLEAN CATCH     Special Requests NONE     Culture  Setup Time       Value: 06/06/2013 09:06     Performed at [facility]       Value: NO GROWTH     Performed at [facility]   Culture       Value: NO GROWTH     Performed at [facility]   Report Status 06/07/2013 FINAL    URINALYSIS, ROUTINE W REFLEX MICROSCOPIC      Result Value Ref Range   Color, Urine YELLOW  YELLOW   APPearance CLEAR  CLEAR   Specific Gravity, Urine 1.018  1.005 - 1.030   pH 7.0  5.0 - 8.0   Glucose, UA 500 (*) NEGATIVE mg/dL   Hgb urine dipstick NEGATIVE  NEGATIVE   Bilirubin Urine NEGATIVE  NEGATIVE   Ketones, ur NEGATIVE  NEGATIVE mg/dL   Protein, ur NEGATIVE  NEGATIVE mg/dL   Urobilinogen, UA 0.2  0.0 - 1.0 mg/dL   Nitrite NEGATIVE  NEGATIVE   Leukocytes, UA NEGATIVE  NEGATIVE  CBC WITH DIFFERENTIAL      Result Value Ref Range   WBC 9.5  4.0 - 10.5 K/uL   RBC 4.66  3.87 - 5.11 MIL/uL   Hemoglobin 14.4  12.0 - 15.0 g/dL   HCT 41.7  36.0 - 46.0 %   MCV 89.5  78.0 - 100.0 fL   MCH 30.9  26.0 - 34.0 pg   MCHC 34.5  30.0 - 36.0 g/dL   RDW 12.1  11.5 - 15.5 %   Platelets 285  150 - 400  K/uL   Neutrophils Relative % 59  43 - 77 %   Neutro Abs 5.6  1.7 - 7.7 K/uL   Lymphocytes Relative 29  12 - 46 %   Lymphs Abs 2.7  0.7 - 4.0 K/uL   Monocytes Relative 9  3 - 12 %   Monocytes Absolute 0.9  0.1 - 1.0 K/uL   Eosinophils Relative 3  0 - 5 %   Eosinophils Absolute 0.3  0.0 - 0.7 K/uL   Basophils Relative 0  0 - 1 %   Basophils Absolute 0.0  0.0 - 0.1 K/uL  COMPREHENSIVE METABOLIC PANEL  Result Value Ref Range   Sodium 141  137 - 147 mEq/L   Potassium 4.4  3.7 - 5.3 mEq/L   Chloride 97  96 - 112 mEq/L   CO2 31  19 - 32 mEq/L   Glucose, Bld 277 (*) 70 - 99 mg/dL   BUN 17  6 - 23 mg/dL   Creatinine, Ser 0.78  0.50 - 1.10 mg/dL   Calcium 10.3  8.4 - 10.5 mg/dL   Total Protein 7.6  6.0 - 8.3 g/dL   Albumin 3.2 (*) 3.5 - 5.2 g/dL   AST 21  0 - 37 U/L   ALT 12  0 - 35 U/L   Alkaline Phosphatase 129 (*) 39 - 117 U/L   Total Bilirubin <0.2 (*) 0.3 - 1.2 mg/dL   GFR calc non Af Amer >90  >90 mL/min   GFR calc Af Amer >90  >90 mL/min  GLUCOSE, CAPILLARY      Result Value Ref Range   Glucose-Capillary 263 (*) 70 - 99 mg/dL   Comment 1 Notify RN    GLUCOSE, CAPILLARY      Result Value Ref Range   Glucose-Capillary 271 (*) 70 - 99 mg/dL  GLUCOSE, CAPILLARY      Result Value Ref Range   Glucose-Capillary 210 (*) 70 - 99 mg/dL   Comment 1 Notify RN    GLUCOSE, CAPILLARY      Result Value Ref Range   Glucose-Capillary 260 (*) 70 - 99 mg/dL  GLUCOSE, CAPILLARY      Result Value Ref Range   Glucose-Capillary 285 (*) 70 - 99 mg/dL  GLUCOSE, CAPILLARY      Result Value Ref Range   Glucose-Capillary 289 (*) 70 - 99 mg/dL   Comment 1 Notify RN    GLUCOSE, CAPILLARY      Result Value Ref Range   Glucose-Capillary 256 (*) 70 - 99 mg/dL  GLUCOSE, CAPILLARY      Result Value Ref Range   Glucose-Capillary 238 (*) 70 - 99 mg/dL  GLUCOSE, CAPILLARY      Result Value Ref Range   Glucose-Capillary 350 (*) 70 - 99 mg/dL   Comment 1 Notify RN    GLUCOSE, CAPILLARY       Result Value Ref Range   Glucose-Capillary 258 (*) 70 - 99 mg/dL   Comment 1 Notify RN    GLUCOSE, CAPILLARY      Result Value Ref Range   Glucose-Capillary 296 (*) 70 - 99 mg/dL   Comment 1 Notify RN    GLUCOSE, CAPILLARY      Result Value Ref Range   Glucose-Capillary 282 (*) 70 - 99 mg/dL   Comment 1 Notify RN    GLUCOSE, CAPILLARY      Result Value Ref Range   Glucose-Capillary 269 (*) 70 - 99 mg/dL  GLUCOSE, CAPILLARY      Result Value Ref Range   Glucose-Capillary 276 (*) 70 - 99 mg/dL  GLUCOSE, CAPILLARY      Result Value Ref Range   Glucose-Capillary 239 (*) 70 - 99 mg/dL   Comment 1 Notify RN    GLUCOSE, CAPILLARY      Result Value Ref Range   Glucose-Capillary 291 (*) 70 - 99 mg/dL   Comment 1 Notify RN    GLUCOSE, CAPILLARY      Result Value Ref Range   Glucose-Capillary 246 (*) 70 - 99 mg/dL   Comment 1 Notify RN    GLUCOSE, CAPILLARY  Result Value Ref Range   Glucose-Capillary 245 (*) 70 - 99 mg/dL   Comment 1 Notify RN    GLUCOSE, CAPILLARY      Result Value Ref Range   Glucose-Capillary 287 (*) 70 - 99 mg/dL   Comment 1 Notify RN    GLUCOSE, CAPILLARY      Result Value Ref Range   Glucose-Capillary 246 (*) 70 - 99 mg/dL  GLUCOSE, CAPILLARY      Result Value Ref Range   Glucose-Capillary 267 (*) 70 - 99 mg/dL  GLUCOSE, CAPILLARY      Result Value Ref Range   Glucose-Capillary 307 (*) 70 - 99 mg/dL  GLUCOSE, CAPILLARY      Result Value Ref Range   Glucose-Capillary 294 (*) 70 - 99 mg/dL  GLUCOSE, CAPILLARY      Result Value Ref Range   Glucose-Capillary 292 (*) 70 - 99 mg/dL  GLUCOSE, CAPILLARY      Result Value Ref Range   Glucose-Capillary 247 (*) 70 - 99 mg/dL  GLUCOSE, CAPILLARY      Result Value Ref Range   Glucose-Capillary 234 (*) 70 - 99 mg/dL   Comment 1 Notify RN    GLUCOSE, CAPILLARY      Result Value Ref Range   Glucose-Capillary 274 (*) 70 - 99 mg/dL   Comment 1 Notify RN    GLUCOSE, CAPILLARY      Result Value Ref Range    Glucose-Capillary 289 (*) 70 - 99 mg/dL   Comment 1 Notify RN    GLUCOSE, CAPILLARY      Result Value Ref Range   Glucose-Capillary 317 (*) 70 - 99 mg/dL   Comment 1 Notify RN    GLUCOSE, CAPILLARY      Result Value Ref Range   Glucose-Capillary 293 (*) 70 - 99 mg/dL   Comment 1 Notify RN    GLUCOSE, CAPILLARY      Result Value Ref Range   Glucose-Capillary 390 (*) 70 - 99 mg/dL  GLUCOSE, CAPILLARY      Result Value Ref Range   Glucose-Capillary 299 (*) 70 - 99 mg/dL  GLUCOSE, CAPILLARY      Result Value Ref Range   Glucose-Capillary 250 (*) 70 - 99 mg/dL  GLUCOSE, CAPILLARY      Result Value Ref Range   Glucose-Capillary 273 (*) 70 - 99 mg/dL  GLUCOSE, CAPILLARY      Result Value Ref Range   Glucose-Capillary 260 (*) 70 - 99 mg/dL   Comment 1 Notify RN    GLUCOSE, CAPILLARY      Result Value Ref Range   Glucose-Capillary 275 (*) 70 - 99 mg/dL   Comment 1 Notify RN    GLUCOSE, CAPILLARY      Result Value Ref Range   Glucose-Capillary 268 (*) 70 - 99 mg/dL   Comment 1 Notify RN    GLUCOSE, CAPILLARY      Result Value Ref Range   Glucose-Capillary 236 (*) 70 - 99 mg/dL  GLUCOSE, CAPILLARY      Result Value Ref Range   Glucose-Capillary 182 (*) 70 - 99 mg/dL  GLUCOSE, CAPILLARY      Result Value Ref Range   Glucose-Capillary 213 (*) 70 - 99 mg/dL  GLUCOSE, CAPILLARY      Result Value Ref Range   Glucose-Capillary 216 (*) 70 - 99 mg/dL   Comment 1 Notify RN    GLUCOSE, CAPILLARY      Result Value Ref Range   Glucose-Capillary  221 (*) 70 - 99 mg/dL   Comment 1 Notify RN    GLUCOSE, CAPILLARY      Result Value Ref Range   Glucose-Capillary 122 (*) 70 - 99 mg/dL  GLUCOSE, CAPILLARY      Result Value Ref Range   Glucose-Capillary 175 (*) 70 - 99 mg/dL  GLUCOSE, CAPILLARY      Result Value Ref Range   Glucose-Capillary 194 (*) 70 - 99 mg/dL   Comment 1 Notify RN    GLUCOSE, CAPILLARY      Result Value Ref Range   Glucose-Capillary 179 (*) 70 - 99 mg/dL   Comment  1 Notify RN    GLUCOSE, CAPILLARY      Result Value Ref Range   Glucose-Capillary 214 (*) 70 - 99 mg/dL   Comment 1 Notify RN    GLUCOSE, CAPILLARY      Result Value Ref Range   Glucose-Capillary 222 (*) 70 - 99 mg/dL   Comment 1 Notify RN    GLUCOSE, CAPILLARY      Result Value Ref Range   Glucose-Capillary 151 (*) 70 - 99 mg/dL  GLUCOSE, CAPILLARY      Result Value Ref Range   Glucose-Capillary 161 (*) 70 - 99 mg/dL  GLUCOSE, CAPILLARY      Result Value Ref Range   Glucose-Capillary 136 (*) 70 - 99 mg/dL  GLUCOSE, CAPILLARY      Result Value Ref Range   Glucose-Capillary 194 (*) 70 - 99 mg/dL  GLUCOSE, CAPILLARY      Result Value Ref Range   Glucose-Capillary 200 (*) 70 - 99 mg/dL   Comment 1 Notify RN    GLUCOSE, CAPILLARY      Result Value Ref Range   Glucose-Capillary 162 (*) 70 - 99 mg/dL   Comment 1 Notify RN     A/P: Pt with hx of left MCA CVA, global aphasia, apraxia, dysphagia/malnutrition. Tent plan is for placement of percutaneous gastrostomy tube on 3/30. Details/risks of procedure d/w pt's family with their understanding and consent. Will obtain portable KUB 3/30 am and if contrast in colon/not in stomach will proceed with case.

## 2013-06-13 NOTE — Progress Notes (Signed)
Occupational Therapy Session Note  Patient Details  Name: [name] MRN: [id number] Date of Birth: August 29, 1960  Today's Date: 06/13/2013 Time: 1045-1130 and 1335-1405 Time Calculation (min): 45 min and 30 min  Short Term Goals: Week 2:  OT Short Term Goal 1 (Week 2): Pt will complete bathing with min assist OT Short Term Goal 2 (Week 2): Pt will complete LB dressing with mod assist OT Short Term Goal 3 (Week 2): Pt will complete UB dressing with min assist OT Short Term Goal 4 (Week 2): Pt will position RUE prior to transfers with min verbal cues OT Short Term Goal 5 (Week 2): Pt will complete 1/3 toileting steps with mid verbal cues  Skilled Therapeutic Interventions/Progress Updates:    1) Engaged in ADL retraining with focus on initiation, sequencing, and attention to RUE during self-care tasks of bathing and dressing.  Perineal hygiene completed at bed level with rolling Rt and Lt. Pt with increased initiation and sequencing of rolling with only initial cue.  Pt incontinent of bowel and bladder requiring physical assist to clean.  Supine to sit on EOB with min assist with pt requiring assist to advance RLE.  Bathing completed in sitting with close supervision, verbal and tactile cues to scan to Rt to wash RUE.  Pt able to doff shirt this session and don clean shirt once RUE threaded through sleeve.  Stand pivot to recliner mod assist.  2) Engaged in NM re-ed in unsupported sitting with focus on dynamic sitting balance to promote increased participation in self-care tasks.  Pt transferred w/c to therapy mat with mod assist requiring physical assist to advance RLE.  Engaged in reaching task in unsupported sitting with weight bearing through RUE while reaching outside BOS in various planes to challenge dynamic sitting balance needed for LB dressing.  Pt hesitant to reach towards floor to obtain rings or place on target to Rt.  Pt's daughter present and providing encouragement.  Pt with no  awareness of loss of saliva on Rt side of mouth, had pt wipe mouth multiple times throughout session with pt wiping cheek and missing spillage from mouth, requiring increased cues and demonstration.  Therapy Documentation Precautions:  Precautions Precautions: Fall Precaution Comments: right hemiperesis, NPO and maintain HOB >30 deg; MONITOR VITALS; Alert RN for BP >180/>105 and hold activity for BP 220/120, monitor for signs of aspiration or abdominal pain and distention Restrictions Weight Bearing Restrictions: No General:   Vital Signs: Therapy Vitals Pulse Rate: 95 BP: 141/83 mmHg Patient Position, if appropriate: Lying Pain:  Pt with no c/o pain  See FIM for current functional status  Therapy/Group: Individual Therapy  Simonne Come 06/13/2013, 12:33 PM

## 2013-06-13 NOTE — Progress Notes (Signed)
Physical Therapy Session Note  Patient Details  Name: [name] MRN: [id number] Date of Birth: 1960/10/07  Today's Date: 06/13/2013 Time: 1400-1500 Time Calculation (min): 60 min  Short Term Goals: Week 2:  PT Short Term Goal 1 (Week 2): Pt will perform bed mobility to L and R with min A and 50% cues for initiation and sequencing PT Short Term Goal 2 (Week 2): Pt will perform bed <> w/c transfers with min A and 50% cues for initiation and sequencing PT Short Term Goal 3 (Week 2): Pt will perform w/c mobility x 50' on unit with L hemi technique and mod A with 50% cues for sequencing PT Short Term Goal 4 (Week 2): Pt will perform gait x 25' with LRAD and mod A PT Short Term Goal 5 (Week 2): Pt will negotiate 3 stairs with one rail and max A x 1 and 50% cues for sequencing  Skilled Therapeutic Interventions/Progress Updates:   Pt received in gym after OT session; daughter present to observe.  Pt BP assessed by OT and within parameters for treatment.  Continued NMR on mat, see below for details.  Pt noted to have incontinent BM during NMR; performed transfer mat <> w/c min-mod A with tactile cues at R quad for WB and activation only. Returned to room and performed static standing at sink with cues for LUE to maintain RUE position on sink and tactile cues to maintain COG in midline and WB through RLE while therapists provided hygiene and changing of brief (+2 needed for safety). Returned to gym to perform more NMR/gait training; see below.  Pt returned to room in w/c and transferred w/c > recliner with min-mod A squat pivot.  Pt set up in recliner to visit with family.    Therapy Documentation Precautions:  Precautions Precautions: Fall Precaution Comments: right hemiperesis, NPO and maintain HOB >30 deg; MONITOR VITALS; Alert RN for BP >180/>105 and hold activity for BP 220/120, monitor for signs of aspiration or abdominal pain and distention Restrictions Weight Bearing Restrictions:  No Vital Signs: Therapy Vitals Pulse Rate: 98 BP: 163/103 mmHg Patient Position, if appropriate: Sitting Oxygen Therapy SpO2: 99 % O2 Device: None (Room air) Pain: Pain Assessment Pain Assessment: No/denies pain Locomotion : Ambulation Ambulation/Gait Assistance: 2: Max assist  Other Treatments: Treatments Neuromuscular Facilitation: Right;Lower Extremity;Forced use;Activity to increase coordination;Activity to increase motor control;Activity to increase timing and sequencing;Activity to increase sustained activation;Activity to increase lateral weight shifting;Activity to increase anterior-posterior weight shifting performing reaching out of BOS up, forwards and to the L to place rings on tall target for L lateral weight shifting and increased RLE activation during sit > stand as well as taking rings from tall target and placing them in a box low on floor to L for L lateral weight shift and eccentric control during stand > sit.  Continued gait training in hallway x 25' with LUE support on wall rail and therapist under [place] with continued focus on pt initiation of L lateral weight shift, trunk elongation, pelvic rotation to initiate R swing and foot clearance and activation of R glute and quad during stance phase.  Pt continues to require mod-max A and extra time for timing and sequencing.    See FIM for current functional status  Therapy/Group: Individual Therapy  Raylene Everts [facility] 06/13/2013, 4:55 PM

## 2013-06-13 NOTE — Progress Notes (Signed)
Subjective/Complaints: 53 y.o. female with history of HTN, DM, bipolar disorder, who was admitted on 05/27/13 with right facial droop and aphasia. 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 2 D echo with EF 55-60%. Carotid dopplers without significant ICA stenosis. 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 Patient with progressive neurologic worsening on 05/29/13 pm with RUE >RLE weakness. Started on hypertonic fluids and transferred to ICU for closer monitoring. Follow up CCT without significant changes. Patient with decrease in LOC with worsening of symptoms  No new problems. bp's remain high, therapy not being held for exceeding parameters, pt appears comfortable and asymptomatic but has limited expressive language abilities   Review of Systems - Negative except aphasic not able to consistently do Y/N Objective: Vital Signs: Blood pressure 162/105, pulse 91, temperature 98.2 F (36.8 C), temperature source Oral, resp. rate 16, weight 72.4 kg (159 lb 9.8 oz), SpO2 98.00%. No results found. Results for orders placed during the hospital encounter of 06/04/13 (from the past 72 hour(s))  GLUCOSE, CAPILLARY     Status: Abnormal   Collection Time    06/10/13  7:43 AM      Result Value Ref Range   Glucose-Capillary 260 (*) 70 - 99 mg/dL   Comment 1 Notify RN    GLUCOSE, CAPILLARY     Status: Abnormal   Collection Time    06/10/13 12:00 PM      Result Value Ref Range   Glucose-Capillary 275 (*) 70 - 99 mg/dL   Comment 1 Notify RN    GLUCOSE, CAPILLARY     Status: Abnormal   Collection Time    06/10/13  4:01 PM       Result Value Ref Range   Glucose-Capillary 268 (*) 70 - 99 mg/dL   Comment 1 Notify RN    GLUCOSE, CAPILLARY     Status: Abnormal   Collection Time    06/10/13  7:58 PM      Result Value Ref Range   Glucose-Capillary 236 (*) 70 - 99 mg/dL  GLUCOSE, CAPILLARY     Status: Abnormal   Collection Time    06/10/13 11:42 PM      Result Value Ref Range   Glucose-Capillary 182 (*) 70 - 99 mg/dL  GLUCOSE, CAPILLARY     Status: Abnormal   Collection Time    06/11/13  3:53 AM      Result Value Ref Range   Glucose-Capillary 213 (*) 70 - 99 mg/dL  GLUCOSE, CAPILLARY     Status: Abnormal   Collection Time    06/11/13  7:47 AM      Result Value Ref Range   Glucose-Capillary 216 (*) 70 - 99 mg/dL   Comment 1 Notify RN    GLUCOSE, CAPILLARY     Status: Abnormal   Collection Time    06/11/13 11:37 AM      Result Value Ref Range   Glucose-Capillary 221 (*) 70 - 99 mg/dL   Comment 1 Notify RN    GLUCOSE, CAPILLARY     Status: Abnormal   Collection Time    06/11/13  4:41 PM      Result Value Ref Range  Glucose-Capillary 122 (*) 70 - 99 mg/dL  GLUCOSE, CAPILLARY     Status: Abnormal   Collection Time    06/11/13  8:30 PM      Result Value Ref Range   Glucose-Capillary 175 (*) 70 - 99 mg/dL  GLUCOSE, CAPILLARY     Status: Abnormal   Collection Time    06/12/13 12:04 AM      Result Value Ref Range   Glucose-Capillary 194 (*) 70 - 99 mg/dL   Comment 1 Notify RN    GLUCOSE, CAPILLARY     Status: Abnormal   Collection Time    06/12/13  4:14 AM      Result Value Ref Range   Glucose-Capillary 179 (*) 70 - 99 mg/dL   Comment 1 Notify RN    GLUCOSE, CAPILLARY     Status: Abnormal   Collection Time    06/12/13  8:00 AM      Result Value Ref Range   Glucose-Capillary 214 (*) 70 - 99 mg/dL   Comment 1 Notify RN    GLUCOSE, CAPILLARY     Status: Abnormal   Collection Time    06/12/13 11:36 AM      Result Value Ref Range   Glucose-Capillary 222 (*) 70 - 99 mg/dL   Comment 1 Notify RN     GLUCOSE, CAPILLARY     Status: Abnormal   Collection Time    06/12/13  4:20 PM      Result Value Ref Range   Glucose-Capillary 151 (*) 70 - 99 mg/dL  GLUCOSE, CAPILLARY     Status: Abnormal   Collection Time    06/12/13  7:54 PM      Result Value Ref Range   Glucose-Capillary 161 (*) 70 - 99 mg/dL  GLUCOSE, CAPILLARY     Status: Abnormal   Collection Time    06/12/13 11:40 PM      Result Value Ref Range   Glucose-Capillary 136 (*) 70 - 99 mg/dL  GLUCOSE, CAPILLARY     Status: Abnormal   Collection Time    06/13/13  4:11 AM      Result Value Ref Range   Glucose-Capillary 194 (*) 70 - 99 mg/dL     HEENT: normal and NG tube left nares, no drainage or skin breakdown Cardio: RRR and no murmur Resp: CTA B/L and unlabored GI: BS positive and non distended, Extremity:  Pulses positive and No Edema Skin:   Intact Neuro: alert today, Flat, Cranial Nerve Abnormalities R central 7, Abnormal Sensory  Abnormal Motor 0/5 RUE and RLE except trace knee ext, normal on Left and Aphasic Opens mouth to command but does not protrude tongue, then perseverates on mouth opening when asked to move arm Musc/Skel:  Other no pain with R shoulder ROM Gen NAD   Assessment/Plan: 1. Functional deficits secondary to Left MCA infarct which require 3+ hours per day of interdisciplinary therapy in a comprehensive inpatient rehab setting. Physiatrist is providing close team supervision and 24 hour management of active medical problems listed below. Physiatrist and rehab team continue to assess barriers to discharge/monitor patient progress toward functional and medical goals.  FIM: FIM - Bathing Bathing Steps Patient Completed: Chest;Abdomen;Right upper leg;Left upper leg;Right Arm Bathing: 3: Mod-Patient completes 5-7 68f 10 parts or 50-74%  FIM - Upper Body Dressing/Undressing Upper body dressing/undressing steps patient completed: Thread/unthread left sleeve of pullover shirt/dress;Put head through  opening of pull over shirt/dress Upper body dressing/undressing: 3: Mod-Patient completed 50-74% of tasks FIM -  Lower Body Dressing/Undressing Lower body dressing/undressing steps patient completed: Thread/unthread left pants leg Lower body dressing/undressing: 1: Total-Patient completed less than 25% of tasks  FIM - Toileting Toileting: 0: Activity did not occur  FIM - Radio producer Devices: Recruitment consultant Transfers: 0-Activity did not occur  FIM - Control and instrumentation engineer Devices: Arm rests Bed/Chair Transfer: 3: Bed > Chair or W/C: Mod A (lift or lower assist);3: Sit > Supine: Mod A (lifting assist/Pt. 50-74%/lift 2 legs);3: Chair or W/C > Bed: Mod A (lift or lower assist)  FIM - Locomotion: Wheelchair Distance: 100 Locomotion: Wheelchair: 1: Total Assistance/staff pushes wheelchair (Pt<25%) FIM - Locomotion: Ambulation Locomotion: Ambulation Assistive Devices: Other (comment) (wall rail) Ambulation/Gait Assistance: 2: Max assist Locomotion: Ambulation: 1: Travels less than 50 ft with maximal assistance (Pt: 25 - 49%)  Comprehension Comprehension Mode: Auditory Comprehension: 2-Understands basic 25 - 49% of the time/requires cueing 51 - 75% of the time  Expression Expression Mode: Verbal Expression: 1-Expresses basis less than 25% of the time/requires cueing greater than 75% of the time.  Social Interaction Social Interaction: 1-Interacts appropriately less than 25% of the time. May be withdrawn or combative.  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 DVT  Prophylaxis/Anticoagulation: Pharmaceutical: Lovenox  2. Pain Management: N/A  3. Bipolar disorder/Mood: No signs of distress reported but patient with poor awareness of deficits. Monitor mood for now. LCSW to follow with patient and family.  4. Neuropsych: This patient is not capable of making decisions on her own behalf.  5. DM type 2: Will monitor with bid checks. Continue to titrate insulin upwards. . Use SSI for elevated BS.  6. HTN: adjust for better control avoiding hypotension with permissive high BP 180-200 range to prevent hypoperfusion/neuro worsening   titrating  labetalol overall improving 7. H/o paranoid schizophrenia: Has been to [facility] in the past due to decompensation. Not on medications as this time--has been on respiridone in the past and has history of non-compliance per records  8. Dysphagia: NPO with panda TF. rec g-tube, d/w SLP would benefit from this, pt unable to give consent will d/w children 9. Resting Tachycardia: C -HR 96-118 range at rest.   On water flushes to avoid dehydration.   -BB will titrate dose, improving LOS (Days) 9 A FACE TO FACE EVALUATION WAS PERFORMED  Lew Prout E 06/13/2013, 6:54 AM

## 2013-06-13 NOTE — Progress Notes (Signed)
Speech Language Pathology Weekly Progress and Session Note  Patient Details  Name: [name] MRN: [id number] Date of Birth: 1960-10-09  Today's Date: 06/13/2013 Time: 0045-9977 Time Calculation (min): 56 min  Short Term Goals: Week 1: SLP Short Term Goal 1 (Week 1): Patient will initiiate 5 swallows per session with Max multi-modal cues. SLP Short Term Goal 1 - Progress (Week 1): Met SLP Short Term Goal 2 (Week 1): Patient will follow 1-step directions with Max multi-modal cues. SLP Short Term Goal 2 - Progress (Week 1): Progressing toward goal SLP Short Term Goal 3 (Week 1): Patient will identify object from a field of 2 with Max mlulti-modal cues. SLP Short Term Goal 3 - Progress (Week 1): Met SLP Short Term Goal 4 (Week 1): Patient will approximate speech sounds with Max assist multi-modal cues. SLP Short Term Goal 4 - Progress (Week 1): Progressing toward goal    New Short Term Goals: Week 2: SLP Short Term Goal 1 (Week 2): Patient will initiiate 20 swallows per session with contextual and Max multi-modal clinician cues. SLP Short Term Goal 2 (Week 2): Patient will follow 1-step directions with contextual and Max multi-modal clinician cues. SLP Short Term Goal 3 (Week 2): Patient will identify object from a field of 2 with Mod multi-modal cues. SLP Short Term Goal 4 (Week 2): Patient will approximate speech sounds during automatic speech tasks with Max assist multi-modal cues.  Weekly Progress Updates: Patient met 2 out of 4 short term goals this reporting period due to gains in ability to initiate swallows and ability to identify object named from a field of two with prep set and verbal and visual cues for functional use of object.  Patient has also made gains in ability to move oral structures with varied patterns and decreased perseveration.  Despite gains patient continues to be NPO and requires Total assist to communicate basic wants and needs and as a result would benefit  from continued skilled SLP intervention to maximize abilities in order to increase her functional independence prior to discharge with 24/7 assist.    Intensity: Minumum of 1-2 x/day, 30 to 90 minutes Frequency: 5 out of 7 days Frequency due to:   Duration/Length of Stay: 21-24 days Treatment/Interventions: Cognitive remediation/compensation;Cueing hierarchy;Dysphagia/aspiration precaution training;Environmental controls;Functional tasks;Internal/external aids;Multimodal communication approach;Patient/family education;Oral motor exercises;Speech/Language facilitation;Therapeutic Activities   Daily Session  Skilled Therapeutic Interventions: Skilled treatment session focused on addressing dysphagia and cognitive-linguistic goals.  Patient followed 1-step directions with Mod assist contextual, verbal, visual and tactile cues during a functional task, oral care via suctioning.  Following oral care patient elicited 1-2 spontaneous swallow of oral secretions with no overt s/s of aspiration.   SLP set-up a natural functional task of consuming ice from a cup to elicit swallows.  Patient demonstrated minimal oral manipulation of bolus with swallow initiation of 1-2 swallows per trial in 9/11 trials.  Patient demonstrated intermittent overt s/s of aspiration during trials.  SLP also facilitated session with prep set of labeling and hand over hand assist to use 4 functional objects.  Patient then identified objects from a field of two with Max assist verbal and visual /demonstrative cues to achieve 33% accuracy.  Recommend to continue with current plan of care.        FIM:  Comprehension Comprehension Mode: Auditory Comprehension: 2-Understands basic 25 - 49% of the time/requires cueing 51 - 75% of the time Expression Expression Mode: Verbal Expression: 1-Expresses basis less than 25% of the time/requires cueing greater than  75% of the time. Social Interaction Social Interaction: 1-Interacts  appropriately less than 25% of the time. May be withdrawn or combative. Problem Solving Problem Solving: 1-Solves basic less than 25% of the time - needs direction nearly all the time or does not effectively solve problems and may need a restraint for safety Memory Memory: 2-Recognizes or recalls 25 - 49% of the time/requires cueing 51 - 75% of the time FIM - Eating Eating Activity: 4: Help with managing cup/glass;5: Needs verbal cues/supervision (with trials) General  Amount of Missed SLP Time (min): 4 Minutes Pain Pain Assessment Pain Assessment: No/denies pain  Therapy/Group: Individual Therapy  Carmelia Roller., CCC-SLP [phone]  [place] 06/13/2013, 1:14 PM

## 2013-06-14 ENCOUNTER — Inpatient Hospital Stay (HOSPITAL_COMMUNITY): Payer: Medicaid Other | Admitting: Physical Therapy

## 2013-06-14 DIAGNOSIS — I6340 Cerebral infarction due to embolism of unspecified cerebral artery: Secondary | ICD-10-CM

## 2013-06-14 DIAGNOSIS — E1149 Type 2 diabetes mellitus with other diabetic neurological complication: Secondary | ICD-10-CM

## 2013-06-14 DIAGNOSIS — I10 Essential (primary) hypertension: Secondary | ICD-10-CM

## 2013-06-14 DIAGNOSIS — R1310 Dysphagia, unspecified: Secondary | ICD-10-CM

## 2013-06-14 LAB — GLUCOSE, CAPILLARY
GLUCOSE-CAPILLARY: 145 mg/dL — AB (ref 70–99)
GLUCOSE-CAPILLARY: 146 mg/dL — AB (ref 70–99)
Glucose-Capillary: 186 mg/dL — ABNORMAL HIGH (ref 70–99)
Glucose-Capillary: 179 mg/dL — ABNORMAL HIGH (ref 70–99)
Glucose-Capillary: 193 mg/dL — ABNORMAL HIGH (ref 70–99)
Glucose-Capillary: 180 mg/dL — ABNORMAL HIGH (ref 70–99)

## 2013-06-14 NOTE — Progress Notes (Signed)
[name] is a 53 y.o. female 1960/07/28 [id number]  Subjective: No new complaints. No new problems. Slept well. Feeling OK.  Objective: Vital signs in last 24 hours: Temp:  [98.4 F (36.9 C)-98.5 F (36.9 C)] 98.4 F (36.9 C) (03/28 0400) Pulse Rate:  [88-102] 102 (03/28 0755) Resp:  [17-18] 17 (03/28 0400) BP: (141-181)/(83-109) 181/106 mmHg (03/28 0755) SpO2:  [97 %-99 %] 98 % (03/28 0400) Weight:  [162 lb 0.6 oz (73.5 kg)] 162 lb 0.6 oz (73.5 kg) (03/28 0400) Weight change: 2 lb 6.8 oz (1.1 kg) Last BM Date: 06/13/13  Intake/Output from previous day: 03/27 0701 - 03/28 0700 In: -  Out: 1 [Stool:1] Last cbgs: CBG (last 3)   Recent Labs  06/13/13 2343 06/14/13 0354 06/14/13 0753  GLUCAP 140* 145* 186*     Physical Exam General: No apparent distress   HEENT: not dry Lungs: Normal effort. Lungs clear to auscultation, no crackles or wheezes. Cardiovascular: Regular rate and rhythm, no edema Abdomen: S/NT/ND; BS(+) NG tube Musculoskeletal:  unchanged Neurological: No new neurological deficits Wounds: N/A    Skin: clear  Aging changes Mental state: Alert, aphasic, cooperative    Lab Results: BMET    Component Value Date/Time   NA 141 06/05/2013 0600   K 4.4 06/05/2013 0600   CL 97 06/05/2013 0600   CO2 31 06/05/2013 0600   GLUCOSE 277* 06/05/2013 0600   BUN 17 06/05/2013 0600   CREATININE 0.78 06/05/2013 0600   CALCIUM 10.3 06/05/2013 0600   GFRNONAA >90 06/05/2013 0600   GFRAA >90 06/05/2013 0600   CBC    Component Value Date/Time   WBC 9.5 06/05/2013 0600   RBC 4.66 06/05/2013 0600   HGB 14.4 06/05/2013 0600   HCT 41.7 06/05/2013 0600   PLT 285 06/05/2013 0600   MCV 89.5 06/05/2013 0600   MCH 30.9 06/05/2013 0600   MCHC 34.5 06/05/2013 0600   RDW 12.1 06/05/2013 0600   LYMPHSABS 2.7 06/05/2013 0600   MONOABS 0.9 06/05/2013 0600   EOSABS 0.3 06/05/2013 0600   BASOSABS 0.0 06/05/2013 0600    Studies/Results: No results found.  Medications: I have  reviewed the patient's current medications.  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 DVT Prophylaxis/Anticoagulation: Pharmaceutical: Lovenox  2. Pain Management: N/A  3. Bipolar disorder/Mood: No signs of distress reported but patient with poor awareness of deficits. Monitor mood for now. LCSW to follow with patient and family.  4. Neuropsych: This patient is not capable of making decisions on her own behalf.  5. DM type 2: Will monitor with bid checks. Continue to titrate insulin upwards. . Use SSI for elevated BS.  6. HTN: adjust for better control avoiding hypotension with permissive high BP 180-200 range to prevent hypoperfusion/neuro worsening  titrating labetalol overall improving  7. H/o paranoid schizophrenia: Has been to [facility] in the past due to decompensation. Not on medications as this time--has been on respiridone in the past and has history of non-compliance per records  8. Dysphagia: NPO with panda TF. rec g-tube, d/w SLP would benefit from this, pt unable to give consent will d/w children  9. Resting Tachycardia: C -HR 96-118 range at rest. On water flushes to avoid dehydration.  -BB will titrate dose, improving  Cont Rx     Length of stay, days: 10  Walker Kehr , MD 06/14/2013, 9:23 AM

## 2013-06-14 NOTE — Progress Notes (Signed)
Physical Therapy Session Note  Patient Details  Name: [name] MRN: [id number] Date of Birth: Dec 24, 1960  Today's Date: 06/14/2013 Time: 1107-1204 Time Calculation (min): 57 min  Short Term Goals: Week 2:  PT Short Term Goal 1 (Week 2): Pt will perform bed mobility to L and R with min A and 50% cues for initiation and sequencing PT Short Term Goal 2 (Week 2): Pt will perform bed <> w/c transfers with min A and 50% cues for initiation and sequencing PT Short Term Goal 3 (Week 2): Pt will perform w/c mobility x 50' on unit with L hemi technique and mod A with 50% cues for sequencing PT Short Term Goal 4 (Week 2): Pt will perform gait x 25' with LRAD and mod A PT Short Term Goal 5 (Week 2): Pt will negotiate 3 stairs with one rail and max A x 1 and 50% cues for sequencing  Skilled Therapeutic Interventions/Progress Updates:   Pt resting in bed.  Performed supine>sit with min A and bed rail with pt initiating advancement of RLE to EOB.  Pt BP taken with diastolic = 938 upon first arising from supine.  Allowed pt to sit EOB for a few minutes and re-assessed BP with diastolic decreasing to 101.  Pt nodding yes when asked about needing to toilet.  Performed squat pivot transfers bed <> BSC with rail with mod A.  Pt also able to maintain standing with UE support on rail (90% WB through LLE) to doff incontinent brief, hygiene and donning of clean brief.  Performed transfers to L and R bed > w/c <> Nustep and w/c > recliner at end of session with min-mod A with intermittent verbal cues for sequencing pivoting to R.  Performed NMR on Nustep and then on stairs.  Returned to room in w/c and to recliner to rest.     Therapy Documentation Precautions:  Precautions Precautions: Fall Precaution Comments: right hemiperesis, NPO and maintain HOB >30 deg; MONITOR VITALS; Alert RN for BP >180/>105 and hold activity for BP 220/120, monitor for signs of aspiration or abdominal pain and  distention Restrictions Weight Bearing Restrictions: No Vital Signs: Therapy Vitals Pulse Rate: 92 BP: 173/100 mmHg Patient Position, if appropriate: Sitting Oxygen Therapy SpO2: 98 % O2 Device: None (Room air) Pain: Pain Assessment Pain Assessment: No/denies pain Other Treatments: Treatments Neuromuscular Facilitation: Right;Lower Extremity;Forced use;Activity to increase motor control;Activity to increase timing and sequencing;Activity to increase sustained activation;Activity to increase lateral weight shifting;Activity to increase anterior-posterior weight shifting on Nustep at level 3 x 5 minutes with LE only to focus on reciprocal movement, coordination and activation of RLE extensors with tactile and verbal cues.  Continued NMR on stairs while performing up/down 5 stairs with L rail and therapist under RUE for trunk control and max assist with RLE advancement to next step and stabilization during stance; max verbal cues needed to sequence stair negotiation.  See FIM for current functional status  Therapy/Group: Individual Therapy  Raylene Everts [facility] 06/14/2013, 12:17 PM

## 2013-06-15 ENCOUNTER — Inpatient Hospital Stay (HOSPITAL_COMMUNITY): Payer: Self-pay | Admitting: Physical Therapy

## 2013-06-15 LAB — GLUCOSE, CAPILLARY
GLUCOSE-CAPILLARY: 161 mg/dL — AB (ref 70–99)
Glucose-Capillary: 123 mg/dL — ABNORMAL HIGH (ref 70–99)
Glucose-Capillary: 128 mg/dL — ABNORMAL HIGH (ref 70–99)
Glucose-Capillary: 139 mg/dL — ABNORMAL HIGH (ref 70–99)
Glucose-Capillary: 142 mg/dL — ABNORMAL HIGH (ref 70–99)
Glucose-Capillary: 156 mg/dL — ABNORMAL HIGH (ref 70–99)

## 2013-06-15 LAB — SURGICAL PCR SCREEN
MRSA, PCR: NEGATIVE
Staphylococcus aureus: POSITIVE — AB

## 2013-06-15 MED ORDER — MUPIROCIN 2 % EX OINT
TOPICAL_OINTMENT | Freq: Two times a day (BID) | CUTANEOUS | Status: DC
  Administered 2013-06-15 – 2013-07-07 (×43): via NASAL
  Filled 2013-06-15 (×2): qty 22

## 2013-06-15 NOTE — Progress Notes (Signed)
Physical Therapy Session Note  Patient Details  Name: [name] MRN: [id number] Date of Birth: 1961/02/07  Today's Date: 06/15/2013 Time: 0900-1000 Time Calculation (min): 60 min  Short Term Goals: Week 2:  PT Short Term Goal 1 (Week 2): Pt will perform bed mobility to L and R with min A and 50% cues for initiation and sequencing PT Short Term Goal 2 (Week 2): Pt will perform bed <> w/c transfers with min A and 50% cues for initiation and sequencing PT Short Term Goal 3 (Week 2): Pt will perform w/c mobility x 50' on unit with L hemi technique and mod A with 50% cues for sequencing PT Short Term Goal 4 (Week 2): Pt will perform gait x 25' with LRAD and mod A PT Short Term Goal 5 (Week 2): Pt will negotiate 3 stairs with one rail and max A x 1 and 50% cues for sequencing  Skilled Therapeutic Interventions/Progress Updates:    Pt received supine in bed with HOB elevated 30 degrees.  Pt performed bed mobility roll side to side with mod A to L and min A to R.  Supine to sit with min/mod Assist pt able to initiate and scoot but requires assistance with R LE. Stand pivot transfers to R with mod A . Sit to stand x 5 from EOB with mod assist and once in standing pt able to maintain standing for approximately 1 minute. W/c mobility x 2 for 25' and 40' with max assist and max vcs as pt displays difficulty with using R hemi technique, requires therapist assist to maintain straight path. Nu-step with L LE and UE only x 5 minutes at Level 4  and then with R LE with therapist stabilizing and maintaining R LE on step x 3 mins at Level 2. NMR to R LE for lateral weight shift. Pt returned to bed with HOB elevated at 30 degrees and all needs within reach.       Therapy Documentation Precautions:  Precautions Precautions: Fall Precaution Comments: right hemiperesis, NPO and maintain HOB >30 deg; MONITOR VITALS; Alert RN for BP >180/>105 and hold activity for BP 220/120, monitor for signs of aspiration or  abdominal pain and distention Restrictions Weight Bearing Restrictions: No    Vital Signs: Therapy Vitals Pulse Rate: 92 BP: 166/44mmHg upon initial sitting, after sitting up for several minutes 156/101. Patient Position, if appropriate:sitting Pain: pt denies pain by shaking her head no                    Other Treatments:   See FIM for current functional status  Therapy/Group: Individual Therapy  Jeanette Caprice 06/15/2013, 1:11 PM

## 2013-06-15 NOTE — Progress Notes (Signed)
150 ml barium placed via NG tube after NG placement confirmed with 20 ml air bolus.

## 2013-06-15 NOTE — Progress Notes (Signed)
Dr. Alain Marion notified of patient's BP 198/120 and due Labetalol at 0800.  Order received:  Okay to give 0800 dose Labetalol now.

## 2013-06-15 NOTE — Progress Notes (Signed)
[name] is a 53 y.o. female 06-06-1960 [id number]  Subjective: No new complaints. No new problems. Slept well. Feeling OK.  Objective: Vital signs in last 24 hours: Temp:  [98.5 F (36.9 C)-98.6 F (37 C)] 98.6 F (37 C) (03/29 0441) Pulse Rate:  [89-97] 92 (03/29 0711) Resp:  [18-19] 19 (03/29 0441) BP: (145-198)/(91-123) 165/106 mmHg (03/29 0711) SpO2:  [95 %-100 %] 95 % (03/29 0441) Weight:  [158 lb 8.2 oz (71.9 kg)] 158 lb 8.2 oz (71.9 kg) (03/29 0628) Weight change: -3 lb 8.4 oz (-1.6 kg) Last BM Date: 06/15/13  Intake/Output from previous day: 03/28 0701 - 03/29 0700 In: 200 [NG/GT:200] Out: -  Last cbgs: CBG (last 3)   Recent Labs  06/15/13 0017 06/15/13 0405 06/15/13 0754  GLUCAP 142* 161* 139*     Physical Exam General: No apparent distress   HEENT: not dry Lungs: Normal effort. Lungs clear to auscultation, no crackles or wheezes. Cardiovascular: Regular rate and rhythm, no edema Abdomen: S/NT/ND; BS(+) NG tube Musculoskeletal:  unchanged Neurological: No new neurological deficits Wounds: N/A    Skin: clear  Aging changes Mental state: Alert, aphasic, cooperative    Lab Results: BMET    Component Value Date/Time   NA 141 06/05/2013 0600   K 4.4 06/05/2013 0600   CL 97 06/05/2013 0600   CO2 31 06/05/2013 0600   GLUCOSE 277* 06/05/2013 0600   BUN 17 06/05/2013 0600   CREATININE 0.78 06/05/2013 0600   CALCIUM 10.3 06/05/2013 0600   GFRNONAA >90 06/05/2013 0600   GFRAA >90 06/05/2013 0600   CBC    Component Value Date/Time   WBC 9.5 06/05/2013 0600   RBC 4.66 06/05/2013 0600   HGB 14.4 06/05/2013 0600   HCT 41.7 06/05/2013 0600   PLT 285 06/05/2013 0600   MCV 89.5 06/05/2013 0600   MCH 30.9 06/05/2013 0600   MCHC 34.5 06/05/2013 0600   RDW 12.1 06/05/2013 0600   LYMPHSABS 2.7 06/05/2013 0600   MONOABS 0.9 06/05/2013 0600   EOSABS 0.3 06/05/2013 0600   BASOSABS 0.0 06/05/2013 0600    Studies/Results: No results found.  Medications: I have  reviewed the patient's current medications.  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 DVT Prophylaxis/Anticoagulation: Pharmaceutical: Lovenox  2. Pain Management: N/A  3. Bipolar disorder/Mood: No signs of distress reported but patient with poor awareness of deficits. Monitor mood for now. LCSW to follow with patient and family.  4. Neuropsych: This patient is not capable of making decisions on her own behalf.  5. DM type 2: Will monitor with bid checks. Continue to titrate insulin upwards. . Use SSI for elevated BS.  6. HTN: adjust for better control avoiding hypotension with permissive high BP 180-200 range to prevent hypoperfusion/neuro worsening  titrating labetalol overall improving  7. H/o paranoid schizophrenia: Has been to [facility] in the past due to decompensation. Not on medications as this time--has been on respiridone in the past and has history of non-compliance per records  8. Dysphagia: NPO with panda TF. rec g-tube, d/w SLP would benefit from this, pt unable to give consent will d/w children  9. Resting Tachycardia: C -HR 96-118 range at rest. On water flushes to avoid dehydration.  -BB will titrate dose, improving  Cont w/current meds/nutrition       Length of stay, days: 11  Walker Kehr , MD 06/15/2013, 9:23 AM

## 2013-06-15 NOTE — Progress Notes (Signed)
Dr. Burnice Logan notified of patient to receive Lantus, standing Novolog 4 units plus sliding scale Novolog tonight with tube feeding to be stopped at 12 midnight.  Orders received:  Put Lantus on hold tonight, but continue short acting  Insulin (Novolog).

## 2013-06-16 ENCOUNTER — Inpatient Hospital Stay (HOSPITAL_COMMUNITY): Payer: Medicaid Other

## 2013-06-16 ENCOUNTER — Inpatient Hospital Stay (HOSPITAL_COMMUNITY): Payer: Medicaid Other | Admitting: Occupational Therapy

## 2013-06-16 ENCOUNTER — Inpatient Hospital Stay (HOSPITAL_COMMUNITY): Payer: Self-pay | Admitting: Physical Therapy

## 2013-06-16 ENCOUNTER — Inpatient Hospital Stay (HOSPITAL_COMMUNITY): Payer: Medicaid Other | Admitting: Speech Pathology

## 2013-06-16 ENCOUNTER — Inpatient Hospital Stay (HOSPITAL_COMMUNITY): Payer: Self-pay | Admitting: Occupational Therapy

## 2013-06-16 DIAGNOSIS — E119 Type 2 diabetes mellitus without complications: Secondary | ICD-10-CM

## 2013-06-16 DIAGNOSIS — I6340 Cerebral infarction due to embolism of unspecified cerebral artery: Secondary | ICD-10-CM

## 2013-06-16 DIAGNOSIS — I10 Essential (primary) hypertension: Secondary | ICD-10-CM

## 2013-06-16 DIAGNOSIS — F319 Bipolar disorder, unspecified: Secondary | ICD-10-CM

## 2013-06-16 LAB — BASIC METABOLIC PANEL
BUN: 14 mg/dL (ref 6–23)
CO2: 26 meq/L (ref 19–32)
Calcium: 9.2 mg/dL (ref 8.4–10.5)
Chloride: 99 mEq/L (ref 96–112)
Creatinine, Ser: 0.72 mg/dL (ref 0.50–1.10)
GFR calc Af Amer: >90 mL/min (ref >90)
GFR calc non Af Amer: >90 mL/min (ref >90)
GLUCOSE: 129 mg/dL — AB (ref 70–99)
POTASSIUM: 4 meq/L (ref 3.7–5.3)
SODIUM: 139 meq/L (ref 137–147)

## 2013-06-16 LAB — GLUCOSE, CAPILLARY
GLUCOSE-CAPILLARY: 145 mg/dL — AB (ref 70–99)
Glucose-Capillary: 112 mg/dL — ABNORMAL HIGH (ref 70–99)
Glucose-Capillary: 113 mg/dL — ABNORMAL HIGH (ref 70–99)
Glucose-Capillary: 139 mg/dL — ABNORMAL HIGH (ref 70–99)
Glucose-Capillary: 143 mg/dL — ABNORMAL HIGH (ref 70–99)
Glucose-Capillary: 94 mg/dL (ref 70–99)

## 2013-06-16 LAB — PROTIME-INR
INR: 0.97 (ref 0.00–1.49)
Prothrombin Time: 12.7 seconds (ref 11.6–15.2)

## 2013-06-16 LAB — CBC
HCT: 39.5 % (ref 36.0–46.0)
HEMOGLOBIN: 13.5 g/dL (ref 12.0–15.0)
MCH: 30.1 pg (ref 26.0–34.0)
MCHC: 34.2 g/dL (ref 30.0–36.0)
MCV: 88.2 fL (ref 78.0–100.0)
Platelets: 302 10*3/uL (ref 150–400)
RBC: 4.48 MIL/uL (ref 3.87–5.11)
RDW: 11.6 % (ref 11.5–15.5)
WBC: 8.5 10*3/uL (ref 4.0–10.5)

## 2013-06-16 LAB — APTT: aPTT: 28 seconds (ref 24–37)

## 2013-06-16 MED ORDER — GLUCAGON HCL (RDNA) 1 MG IJ SOLR
INTRAMUSCULAR | Status: AC | PRN
  Administered 2013-06-16: 1 mg via INTRAVENOUS

## 2013-06-16 MED ORDER — LABETALOL HCL 5 MG/ML IV SOLN
INTRAVENOUS | Status: AC
  Filled 2013-06-16: qty 4

## 2013-06-16 MED ORDER — LABETALOL HCL 5 MG/ML IV SOLN
INTRAVENOUS | Status: AC | PRN
  Administered 2013-06-16 (×2): 10 mg via INTRAVENOUS

## 2013-06-16 MED ORDER — MIDAZOLAM HCL 2 MG/2ML IJ SOLN
INTRAMUSCULAR | Status: AC
  Filled 2013-06-16: qty 4

## 2013-06-16 MED ORDER — FENTANYL CITRATE 0.05 MG/ML IJ SOLN
INTRAMUSCULAR | Status: AC
  Filled 2013-06-16: qty 4

## 2013-06-16 MED ORDER — FENTANYL CITRATE 0.05 MG/ML IJ SOLN
INTRAMUSCULAR | Status: AC | PRN
  Administered 2013-06-16 (×2): 50 ug via INTRAVENOUS

## 2013-06-16 MED ORDER — MIDAZOLAM HCL 2 MG/2ML IJ SOLN
INTRAMUSCULAR | Status: AC | PRN
  Administered 2013-06-16 (×2): 1 mg via INTRAVENOUS

## 2013-06-16 MED ORDER — IOHEXOL 300 MG/ML  SOLN
50.0000 mL | Freq: Once | INTRAMUSCULAR | Status: AC | PRN
  Administered 2013-06-16: 20 mL via INTRAVENOUS

## 2013-06-16 MED ORDER — SODIUM CHLORIDE 0.9 % IV SOLN
INTRAVENOUS | Status: DC
  Administered 2013-06-16 – 2013-06-17 (×2): via INTRAVENOUS

## 2013-06-16 MED ORDER — GLUCAGON HCL (RDNA) 1 MG IJ SOLR
INTRAMUSCULAR | Status: AC
  Filled 2013-06-16: qty 1

## 2013-06-16 MED ORDER — MORPHINE SULFATE 2 MG/ML IJ SOLN
2.0000 mg | Freq: Four times a day (QID) | INTRAMUSCULAR | Status: DC | PRN
  Administered 2013-06-17: 2 mg via INTRAVENOUS
  Filled 2013-06-16: qty 1

## 2013-06-16 NOTE — Progress Notes (Signed)
Physical Therapy Session Note  Patient Details  Name: [name] MRN: [id number] Date of Birth: [birth year]  Today's Date: 06/16/2013  Short Term Goals: Week 2:  PT Short Term Goal 1 (Week 2): Pt will perform bed mobility to L and R with min A and 50% cues for initiation and sequencing PT Short Term Goal 2 (Week 2): Pt will perform bed <> w/c transfers with min A and 50% cues for initiation and sequencing PT Short Term Goal 3 (Week 2): Pt will perform w/c mobility x 50' on unit with L hemi technique and mod A with 50% cues for sequencing PT Short Term Goal 4 (Week 2): Pt will perform gait x 25' with LRAD and mod A PT Short Term Goal 5 (Week 2): Pt will negotiate 3 stairs with one rail and max A x 1 and 50% cues for sequencing  Skilled Therapeutic Interventions/Progress Updates:   Pt just returned from having PEG placed.  Pt flat in bed asleep.  Pt began coughing and unable to clear secretions.  Elevated HOB 30 deg and assisted pt with suction.  Attempted to arouse pt for therapy session but pt unable to remain awake.  Will allow pt to rest this pm and resume therapy tomorrow.  Pt left with all items within reach.    Therapy Documentation Precautions:  Precautions Precautions: Fall Precaution Comments: right hemiperesis, NPO and maintain HOB >30 deg; MONITOR VITALS; Alert RN for BP >180/>105 and hold activity for BP 220/120, monitor for signs of aspiration or abdominal pain and distention Restrictions Weight Bearing Restrictions: No General: Amount of Missed PT Time (min): 60 Minutes Missed Time Reason: Patient fatigue (still lethargic from PEG placement) Vital Signs: Therapy Vitals Temp: 98.6 F (37 C) Temp src: Oral Pulse Rate: 85 Resp: 17 BP: 151/84 mmHg Patient Position, if appropriate: Lying Oxygen Therapy SpO2: 96 % O2 Device: None (Room air) O2 Flow Rate (L/min): 2 L/min End Tidal CO2 (EtCO2): 30 Pain: Pain Assessment Pain Assessment: 0-10 Pain Score: 0-No  pain  See FIM for current functional status  Raylene Everts [place] 06/16/2013, 2:32 PM

## 2013-06-16 NOTE — Progress Notes (Signed)
Occupational Therapy Note  Patient Details  Name: [name] MRN: [id number] Date of Birth: 10-21-1960 Today's Date: 06/16/2013  Pt missed 30 mins skilled OT treatment session secondary to off unit for PEG placement.  Will follow up as able.  Simonne Come 06/16/2013, 2:08 PM

## 2013-06-16 NOTE — Sedation Documentation (Signed)
Patient denies pain and is resting comfortably.  

## 2013-06-16 NOTE — Progress Notes (Signed)
Speech Language Pathology Daily Session Note  Patient Details  Name: [name] MRN: [id number] Date of Birth: 05-03-1960  Today's Date: 06/16/2013 Time: 0920-1020 Time Calculation (min): 60 min  Short Term Goals: Week 2: SLP Short Term Goal 1 (Week 2): Patient will initiiate 20 swallows per session with contextual and Max multi-modal clinician cues. SLP Short Term Goal 2 (Week 2): Patient will follow 1-step directions with contextual and Max multi-modal clinician cues. SLP Short Term Goal 3 (Week 2): Patient will identify object from a field of 2 with Mod multi-modal cues. SLP Short Term Goal 4 (Week 2): Patient will approximate speech sounds during automatic speech tasks with Max assist multi-modal cues.  Skilled Therapeutic Interventions: Skilled treatment session focused on addressing dysphagia and cognitive-linguistic goals.  Patient followed 1-step directions with Max assist verbal, visual and tactile cues.  SLP also facilitated session with prep set of labeling and hand over hand assist to use 4 functional objects.  Patient then identified objects from a field of two with Max assist verbal and visual /demonstrative cues to achieve 66% accuracy.  SLP facilitated session with automatic verbal sequences such as singing and counting; patient participated with labial movement but no voicing.  Patient demonstrated voicing during laughter and automatic agreements.   Patient initiated holding SLP's hand and facial expression indicated desire to express need/want; following questioning SLP was able to determine patient needed to utilize the bathroom and nurse tech was called to assist.  Continue with current plan of care.    FIM:  Comprehension Comprehension Mode: Auditory Comprehension: 2-Understands basic 25 - 49% of the time/requires cueing 51 - 75% of the time Expression Expression: 1-Expresses basis less than 25% of the time/requires cueing greater than 75% of the time. Social  Interaction Social Interaction: 2-Interacts appropriately 25 - 49% of time - Needs frequent redirection. Problem Solving Problem Solving: 2-Solves basic 25 - 49% of the time - needs direction more than half the time to initiate, plan or complete simple activities Memory Memory: 2-Recognizes or recalls 25 - 49% of the time/requires cueing 51 - 75% of the time  Pain Pain Assessment Pain Assessment: No/denies pain Pain Score: 0-No pain  Therapy/Group: Individual Therapy  Carmelia Roller., CCC-SLP [phone]  [place] 06/16/2013, 4:53 PM

## 2013-06-16 NOTE — Progress Notes (Signed)
Subjective/Complaints: 53 y.o. female with history of HTN, DM, bipolar disorder, who was admitted on 05/27/13 with right facial droop and aphasia. 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 2 D echo with EF 55-60%. Carotid dopplers without significant ICA stenosis. 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 Patient with progressive neurologic worsening on 05/29/13 pm with RUE >RLE weakness. Started on hypertonic fluids and transferred to ICU for closer monitoring. Follow up CCT without significant changes. Patient with decrease in LOC with worsening of symptoms  No new problems. Denies pain/no distress. For g tube today Review of Systems - Negative except aphasic not able to consistently do Y/N Objective: Vital Signs: Blood pressure 170/94, pulse 88, temperature 98.3 F (36.8 C), temperature source Oral, resp. rate 20, weight 72.576 kg (160 lb), SpO2 96.00%. Dg Abd Portable 1v  06/16/2013   CLINICAL DATA:  Evaluate barium prior to potential percutaneous gastrostomy tube placement  EXAM: PORTABLE ABDOMEN - 1 VIEW  COMPARISON:  DG ABD PORTABLE 1V dated 06/03/2013  FINDINGS: Administered barium extends throughout the transverse colon. No evidence of enteric obstruction.  Nondiagnostic evaluation for pneumoperitoneum secondary to supine positioning and exclusion of the lower thorax.  Enteric tube tip and side port overlie the expected location of the gastric fundus. There is no definitive residual barium within the stomach given exclusion of the cranial aspect of the gastric fundus.  Unchanged bones.  IMPRESSION: Enteric contrast seen  throughout the transverse colon.   Electronically Signed   By: Sandi Mariscal M.D.   On: 06/16/2013 08:01   Results for orders placed during the hospital encounter of 06/04/13 (from the past 72 hour(s))  GLUCOSE, CAPILLARY     Status: Abnormal   Collection Time    06/13/13 11:40 AM      Result Value Ref Range   Glucose-Capillary 162 (*) 70 - 99 mg/dL   Comment 1 Notify RN    GLUCOSE, CAPILLARY     Status: None   Collection Time    06/13/13  4:07 PM      Result Value Ref Range   Glucose-Capillary 88  70 - 99 mg/dL  GLUCOSE, CAPILLARY     Status: Abnormal   Collection Time    06/13/13  8:06 PM      Result Value Ref Range   Glucose-Capillary 145 (*) 70 - 99 mg/dL  GLUCOSE, CAPILLARY     Status: Abnormal   Collection Time    06/13/13 11:43 PM      Result Value Ref Range   Glucose-Capillary 140 (*) 70 - 99 mg/dL  GLUCOSE, CAPILLARY     Status: Abnormal   Collection Time    06/14/13  3:54 AM      Result Value Ref Range   Glucose-Capillary 145 (*) 70 - 99 mg/dL  GLUCOSE, CAPILLARY     Status: Abnormal   Collection Time    06/14/13  7:53 AM      Result Value Ref Range   Glucose-Capillary 186 (*) 70 - 99 mg/dL  GLUCOSE, CAPILLARY     Status: Abnormal   Collection Time    06/14/13 12:02 PM      Result Value Ref Range  Glucose-Capillary 179 (*) 70 - 99 mg/dL  GLUCOSE, CAPILLARY     Status: Abnormal   Collection Time    06/14/13  1:54 PM      Result Value Ref Range   Glucose-Capillary 193 (*) 70 - 99 mg/dL  GLUCOSE, CAPILLARY     Status: Abnormal   Collection Time    06/14/13  5:00 PM      Result Value Ref Range   Glucose-Capillary 180 (*) 70 - 99 mg/dL  GLUCOSE, CAPILLARY     Status: Abnormal   Collection Time    06/14/13  8:03 PM      Result Value Ref Range   Glucose-Capillary 146 (*) 70 - 99 mg/dL  GLUCOSE, CAPILLARY     Status: Abnormal   Collection Time    06/15/13 12:17 AM      Result Value Ref Range   Glucose-Capillary 142 (*) 70 - 99 mg/dL  GLUCOSE, CAPILLARY      Status: Abnormal   Collection Time    06/15/13  4:05 AM      Result Value Ref Range   Glucose-Capillary 161 (*) 70 - 99 mg/dL  GLUCOSE, CAPILLARY     Status: Abnormal   Collection Time    06/15/13  7:54 AM      Result Value Ref Range   Glucose-Capillary 139 (*) 70 - 99 mg/dL  SURGICAL PCR SCREEN     Status: Abnormal   Collection Time    06/15/13 11:47 AM      Result Value Ref Range   MRSA, PCR NEGATIVE  NEGATIVE   Staphylococcus aureus POSITIVE (*) NEGATIVE   Comment:            The Xpert SA Assay (FDA     approved for NASAL specimens     in patients over 81 years of age),     is one component of     a comprehensive surveillance     program.  Test performance has     been validated by [employer] for patients greater     than or equal to 25 year old.     It is not intended     to diagnose infection nor to     guide or monitor treatment.  GLUCOSE, CAPILLARY     Status: Abnormal   Collection Time    06/15/13 11:54 AM      Result Value Ref Range   Glucose-Capillary 128 (*) 70 - 99 mg/dL  GLUCOSE, CAPILLARY     Status: Abnormal   Collection Time    06/15/13  4:09 PM      Result Value Ref Range   Glucose-Capillary 156 (*) 70 - 99 mg/dL   Comment 1 Documented in Chart    GLUCOSE, CAPILLARY     Status: Abnormal   Collection Time    06/15/13  8:25 PM      Result Value Ref Range   Glucose-Capillary 123 (*) 70 - 99 mg/dL  GLUCOSE, CAPILLARY     Status: Abnormal   Collection Time    06/16/13 12:08 AM      Result Value Ref Range   Glucose-Capillary 113 (*) 70 - 99 mg/dL  GLUCOSE, CAPILLARY     Status: None   Collection Time    06/16/13  4:43 AM      Result Value Ref Range   Glucose-Capillary 94  70 - 99 mg/dL  CBC     Status: None   Collection  Time    06/16/13  7:00 AM      Result Value Ref Range   WBC 8.5  4.0 - 10.5 K/uL   RBC 4.48  3.87 - 5.11 MIL/uL   Hemoglobin 13.5  12.0 - 15.0 g/dL   HCT 39.5  36.0 - 46.0 %   MCV 88.2  78.0 - 100.0 fL   MCH 30.1  26.0 -  34.0 pg   MCHC 34.2  30.0 - 36.0 g/dL   RDW 11.6  11.5 - 15.5 %   Platelets 302  150 - 400 K/uL  PROTIME-INR     Status: None   Collection Time    06/16/13  7:00 AM      Result Value Ref Range   Prothrombin Time 12.7  11.6 - 15.2 seconds   INR 0.97  0.00 - 1.49  APTT     Status: None   Collection Time    06/16/13  7:00 AM      Result Value Ref Range   aPTT 28  24 - 37 seconds  BASIC METABOLIC PANEL     Status: Abnormal   Collection Time    06/16/13  7:00 AM      Result Value Ref Range   Sodium 139  137 - 147 mEq/L   Potassium 4.0  3.7 - 5.3 mEq/L   Chloride 99  96 - 112 mEq/L   CO2 26  19 - 32 mEq/L   Glucose, Bld 129 (*) 70 - 99 mg/dL   BUN 14  6 - 23 mg/dL   Creatinine, Ser 0.72  0.50 - 1.10 mg/dL   Calcium 9.2  8.4 - 10.5 mg/dL   GFR calc non Af Amer >90  >90 mL/min   GFR calc Af Amer >90  >90 mL/min   Comment: (NOTE)     The eGFR has been calculated using the CKD EPI equation.     This calculation has not been validated in all clinical situations.     eGFR's persistently <90 mL/min signify possible Chronic Kidney     Disease.  GLUCOSE, CAPILLARY     Status: Abnormal   Collection Time    06/16/13  7:52 AM      Result Value Ref Range   Glucose-Capillary 139 (*) 70 - 99 mg/dL     HEENT: normal and NG tube left nares, no drainage or skin breakdown Cardio: RRR and no murmur Resp: CTA B/L and unlabored GI: BS positive and non distended, Extremity:  Pulses positive and No Edema Skin:   Intact Neuro: alert today, Flat, Cranial Nerve Abnormalities R central 7, Abnormal Sensory  Abnormal Motor 0/5 RUE and RLE except trace knee ext, normal on Left and Aphasic Opens mouth to command but does not protrude tongue, then perseverates on mouth opening when asked to move arm Musc/Skel:  Other no pain with R shoulder ROM Gen NAD   Assessment/Plan: 1. Functional deficits secondary to Left MCA infarct which require 3+ hours per day of interdisciplinary therapy in a comprehensive  inpatient rehab setting. Physiatrist is providing close team supervision and 24 hour management of active medical problems listed below. Physiatrist and rehab team continue to assess barriers to discharge/monitor patient progress toward functional and medical goals.  FIM: FIM - Bathing Bathing Steps Patient Completed: Chest;Abdomen;Right upper leg;Left upper leg;Right Arm Bathing: 3: Mod-Patient completes 5-7 24f10 parts or 50-74%  FIM - Upper Body Dressing/Undressing Upper body dressing/undressing steps patient completed: Thread/unthread left sleeve of pullover shirt/dress Upper body  dressing/undressing: 2: Max-Patient completed 25-49% of tasks FIM - Lower Body Dressing/Undressing Lower body dressing/undressing steps patient completed: Thread/unthread left pants leg Lower body dressing/undressing: 1: Total-Patient completed less than 25% of tasks  FIM - Toileting Toileting: 0: No continent bowel/bladder events this shift  FIM - Radio producer Devices: Recruitment consultant Transfers: 3-To toilet/BSC: Mod A (lift or lower assist);3-From toilet/BSC: Mod A (lift or lower assist)  FIM - Bed/Chair Transfer Bed/Chair Transfer Assistive Devices: Arm rests;Bed rails Bed/Chair Transfer: 3: Bed > Chair or W/C: Mod A (lift or lower assist);3: Chair or W/C > Bed: Mod A (lift or lower assist)  FIM - Locomotion: Wheelchair Distance: 100 Locomotion: Wheelchair: 1: Travels less than 50 ft with maximal assistance (Pt: 25 - 49%) FIM - Locomotion: Ambulation Locomotion: Ambulation Assistive Devices: Other (comment) Ambulation/Gait Assistance: 2: Max assist Locomotion: Ambulation: 0: Activity did not occur  Comprehension Comprehension Mode: Auditory Comprehension: 2-Understands basic 25 - 49% of the time/requires cueing 51 - 75% of the time  Expression Expression Mode: Nonverbal Expression: 1-Expresses basis less than 25% of the time/requires cueing greater than  75% of the time.  Social Interaction Social Interaction: 1-Interacts appropriately less than 25% of the time. May be withdrawn or combative.  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 DVT Prophylaxis/Anticoagulation: Pharmaceutical: Lovenox  2. Pain Management: N/A  3. Bipolar disorder/Mood: No signs of distress reported but patient with poor awareness of deficits. Monitor mood for now. LCSW to follow with patient and family.  4. Neuropsych: This patient is not capable of making decisions on her own behalf.  5. DM type 2: Will monitor with bid checks. Continue to titrate insulin as needed---will change with potential bolus feeding schedule. Use SSI for elevated BS.  6. HTN: adjust for better control avoiding hypotension with permissive high BP 180-200 range to prevent hypoperfusion/neuro worsening   titrating  labetalol carefully 7. H/o paranoid schizophrenia: Has been to [facility] in the past due to decompensation. Not on medications as this time--has been on respiridone in the past and has history of non-compliance per records  8. Dysphagia: NPO with panda TF. For G-tube per IR 9. Resting Tachycardia:     On water flushes to avoid dehydration.   -BB  , improving LOS (Days) 12 A FACE TO FACE EVALUATION WAS PERFORMED  Beatrix Breece T 06/16/2013, 8:23 AM

## 2013-06-16 NOTE — Progress Notes (Signed)
Occupational Therapy Session Note  Patient Details  Name: [name] MRN: [id number] Date of Birth: Apr 30, 1960  Today's Date: 06/16/2013 Time: 1030-1124 Time Calculation (min): 54 min  Short Term Goals: Week 2:  OT Short Term Goal 1 (Week 2): Pt will complete bathing with min assist OT Short Term Goal 2 (Week 2): Pt will complete LB dressing with mod assist OT Short Term Goal 3 (Week 2): Pt will complete UB dressing with min assist OT Short Term Goal 4 (Week 2): Pt will position RUE prior to transfers with min verbal cues OT Short Term Goal 5 (Week 2): Pt will complete 1/3 toileting steps with mid verbal cues  Skilled Therapeutic Interventions/Progress Updates:    Engaged in therapeutic activity with focus on sitting balance at EOB and sequencing with pattern making and matching activity.  Engaged in sorting cards red/black with pt making 2 errors out of approx 40 cards with ability to correct error with increased time, challenged pt to sort cards by suit with pt unable to match any cards attempted to re-demonstrate with pt again with no carryover.  Engaged in copying pattern with connect four with pt unable to copy red/yellow pattern in various patterns and unable to identify errors.  Downgraded task such that all tiles on Lt were red and Rt were yellow with therapist copying pattern 2 more times, followed by pt able to follow pattern with increased time.  Pt returned to supine in bed with RUE propped on pillow.  Therapy Documentation Precautions:  Precautions Precautions: Fall Precaution Comments: right hemiperesis, NPO and maintain HOB >30 deg; MONITOR VITALS; Alert RN for BP >180/>105 and hold activity for BP 220/120, monitor for signs of aspiration or abdominal pain and distention Restrictions Weight Bearing Restrictions: No General:   Vital Signs: Therapy Vitals Pulse Rate: 86 Resp: 14 BP: 180/102 mmHg Patient Position, if appropriate: Lying Oxygen Therapy SpO2: 100  % O2 Device: None (Room air) O2 Flow Rate (L/min): 2 L/min End Tidal CO2 (EtCO2): 30 Pain: Pain Assessment Pain Assessment: No/denies pain  See FIM for current functional status  Therapy/Group: Individual Therapy  Simonne Come 06/16/2013, 1:24 PM

## 2013-06-16 NOTE — Procedures (Signed)
20 Fr G tube No comp

## 2013-06-16 NOTE — Progress Notes (Signed)
Dr. Burnice Logan notified of patient's CBG 94 now; NPO for procedure today; and due Novolog4 units now.  Order received:  Hold 0400 dose Novolog today.

## 2013-06-17 ENCOUNTER — Inpatient Hospital Stay (HOSPITAL_COMMUNITY): Payer: Medicaid Other | Admitting: Occupational Therapy

## 2013-06-17 ENCOUNTER — Inpatient Hospital Stay (HOSPITAL_COMMUNITY): Payer: Medicaid Other | Admitting: Physical Therapy

## 2013-06-17 ENCOUNTER — Inpatient Hospital Stay (HOSPITAL_COMMUNITY): Payer: Medicaid Other | Admitting: Speech Pathology

## 2013-06-17 DIAGNOSIS — I6340 Cerebral infarction due to embolism of unspecified cerebral artery: Secondary | ICD-10-CM

## 2013-06-17 DIAGNOSIS — F319 Bipolar disorder, unspecified: Secondary | ICD-10-CM

## 2013-06-17 DIAGNOSIS — I10 Essential (primary) hypertension: Secondary | ICD-10-CM

## 2013-06-17 DIAGNOSIS — E119 Type 2 diabetes mellitus without complications: Secondary | ICD-10-CM

## 2013-06-17 LAB — GLUCOSE, CAPILLARY
GLUCOSE-CAPILLARY: 109 mg/dL — AB (ref 70–99)
GLUCOSE-CAPILLARY: 135 mg/dL — AB (ref 70–99)
GLUCOSE-CAPILLARY: 142 mg/dL — AB (ref 70–99)
GLUCOSE-CAPILLARY: 97 mg/dL (ref 70–99)
Glucose-Capillary: 110 mg/dL — ABNORMAL HIGH (ref 70–99)
Glucose-Capillary: 107 mg/dL — ABNORMAL HIGH (ref 70–99)
Glucose-Capillary: 105 mg/dL — ABNORMAL HIGH (ref 70–99)

## 2013-06-17 MED ORDER — CHLORHEXIDINE GLUCONATE 0.12 % MT SOLN
15.0000 mL | Freq: Two times a day (BID) | OROMUCOSAL | Status: DC
  Administered 2013-06-17 – 2013-07-08 (×43): 15 mL via OROMUCOSAL
  Filled 2013-06-17 (×47): qty 15

## 2013-06-17 MED ORDER — BIOTENE DRY MOUTH MT LIQD
15.0000 mL | Freq: Two times a day (BID) | OROMUCOSAL | Status: DC
  Administered 2013-06-17 – 2013-07-08 (×38): 15 mL via OROMUCOSAL

## 2013-06-17 MED ORDER — GLUCERNA 1.2 CAL PO LIQD
1000.0000 mL | ORAL | Status: DC
  Filled 2013-06-17 (×9): qty 1000

## 2013-06-17 MED ORDER — GLUCERNA 1.2 CAL PO LIQD
350.0000 mL | Freq: Four times a day (QID) | ORAL | Status: DC
  Administered 2013-06-17 – 2013-06-19 (×7): 350 mL
  Filled 2013-06-17 (×17): qty 474

## 2013-06-17 NOTE — Progress Notes (Signed)
NUTRITION FOLLOW UP  Intervention:   1. Enteral nutrition; Current regimen is ordered at Glucerna 1.2 350 mL 4 times daily.  This meets intake goals of >/=90% of estimated needs.  Continue current bolus regimen and monitor weights for adequacy.   NUTRITION DIAGNOSIS:  Inadequate oral intake related to inability to eat, dysphagia as evidenced by SLP assessment.   Monitor:  1. Enteral nutrition; initiation with tolerance. Pt to meet >/=90% estimated needs with nutrition support. Met.  2. Wt/wt change; monitor trends. Ongoing.   Assessment:   53 y.o. female with history of HTN, DM, bipolar disorder, who was admitted on 05/27/13 with right facial droop and aphasia. 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   Pt is s/p G-tube placement in IR yesterday.  Per IR, ok to resume TFs. Previously tolerating TFs well. Denies nausea, abdominal pain or discomfort.  Current regimen of 350 mL 4 times daily provides 1680 kcal, 84g protein, and 1135 mL free water which meets 93% estimated energy needs.  Pt has received bolus feed x1 today.   Height: Ht Readings from Last 1 Encounters:  05/27/13 _0  (1.676 m)    Weight Status:   Wt Readings from Last 1 Encounters:  06/17/13 160 lb (72.576 kg)   Re-estimated needs:  Kcal: 1800-1980 Protein: 70-85g Fluid: >1.8 L/day  Skin: intact  Diet Order: NPO  No intake or output data in the 24 hours ending 06/17/13 1359  Last BM: 3/29   Labs:   Recent Labs Lab 06/16/13 0700  NA 139  K 4.0  CL 99  CO2 26  BUN 14  CREATININE 0.72  CALCIUM 9.2  GLUCOSE 129*    CBG (last 3)   Recent Labs  06/17/13 0406 06/17/13 0745  06/17/13 1149  GLUCAP 109* 110* 107*    Scheduled Meds: . antiseptic oral rinse  15 mL Mouth Rinse q12n4p  . aspirin  325 mg Per Tube Daily  . chlorhexidine  15 mL Mouth Rinse BID  . enoxaparin (LOVENOX) injection  30 mg Subcutaneous Q24H  . feeding supplement (GLUCERNA 1.2 CAL)  350 mL Per Tube QID  . free water  200 mL Per Tube TID WC & HS  . insulin aspart  0-15 Units Subcutaneous 6 times per day  . insulin aspart  4 Units Subcutaneous 6 times per day  . insulin glargine  20 Units Subcutaneous BID  . labetalol  200 mg Per NG tube TID  . mupirocin ointment   Nasal BID  . nystatin  5 mL Oral QID  . simvastatin  20 mg Per Tube q1800    Continuous Infusions:    Brynda Greathouse, MS RD LDN Clinical Inpatient Dietitian Pager: [phone] Weekend/After hours pager: [phone]

## 2013-06-17 NOTE — Progress Notes (Signed)
Speech Language Pathology Daily Session Note  Patient Details  Name: [name] MRN: [id number] Date of Birth: [birth year]  Today's Date: 06/17/2013 Time: 3419-3790 Time Calculation (min): 50 min  Short Term Goals: Week 2: SLP Short Term Goal 1 (Week 2): Patient will initiiate 20 swallows per session with contextual and Max multi-modal clinician cues. SLP Short Term Goal 2 (Week 2): Patient will follow 1-step directions with contextual and Max multi-modal clinician cues. SLP Short Term Goal 3 (Week 2): Patient will identify object from a field of 2 with Mod multi-modal cues. SLP Short Term Goal 4 (Week 2): Patient will approximate speech sounds during automatic speech tasks with Max assist multi-modal cues.  Skilled Therapeutic Interventions: Skilled treatment session focused on addressing dysphagia and cognitive-linguistic goals.  Patient followed 1-step directions with Max assist verbal, visual and tactile cues during oral care via suctioning.  SLP set-up a natural functional task of consuming ice from a cup to elicit swallows. Patient demonstrated some oral manipulation of bolus with 1-3 swallow initiations per trial.  Patient demonstrated strong reflexive cough in 3/20 ice chip trials. SLP also facilitated session with prep set of labeling and hand over hand assist to use 4 functional objects.  Patient then identified objects from a field of two with Max assist verbal and visual /demonstrative cues to achieve 66% accuracy.  SLP facilitated session with automatic verbal sequences such as singing and counting; patient participated with labial movement but no voicing.  Patient demonstrated voicing during laughter and automatic agreements.  Continue with current plan of care.   FIM:  Comprehension Comprehension Mode: Auditory Comprehension: 2-Understands basic 25 - 49% of the time/requires cueing 51 - 75% of the time Expression Expression: 1-Expresses basis less than 25% of the  time/requires cueing greater than 75% of the time. Social Interaction Social Interaction: 2-Interacts appropriately 25 - 49% of time - Needs frequent redirection. Problem Solving Problem Solving: 2-Solves basic 25 - 49% of the time - needs direction more than half the time to initiate, plan or complete simple activities Memory Memory: 2-Recognizes or recalls 25 - 49% of the time/requires cueing 51 - 75% of the time  Pain Pain Assessment Pain Assessment: No/denies pain  Therapy/Group: Individual Therapy  Carmelia Roller., [place] [phone]  [place] 06/17/2013, 1:09 PM

## 2013-06-17 NOTE — Progress Notes (Signed)
Social Work Patient ID: [name], female   DOB: 12/08/1960, 53 y.o.   MRN: [id number] Met with pt, son and daughter to complete some Medicaid forms, daughter reports [place] to be here tomorrow at 10;30 to begin [facility] application. Discussed having them begin family education next week to begin learning their Mom's care.  Daughter reports she can start next week.  Both SSD and Medicaid applications are pending. Son and daughter still want to take pt home at discharge.

## 2013-06-17 NOTE — Progress Notes (Signed)
Occupational Therapy Session Note  Patient Details  Name: [name] MRN: [id number] Date of Birth: 03-06-1961  Today's Date: 06/17/2013 Time: [phone] and [phone] Time Calculation (min): 53 min and 30 min  Short Term Goals: Week 2:  OT Short Term Goal 1 (Week 2): Pt will complete bathing with min assist OT Short Term Goal 2 (Week 2): Pt will complete LB dressing with mod assist OT Short Term Goal 3 (Week 2): Pt will complete UB dressing with min assist OT Short Term Goal 4 (Week 2): Pt will position RUE prior to transfers with min verbal cues OT Short Term Goal 5 (Week 2): Pt will complete 1/3 toileting steps with mid verbal cues  Skilled Therapeutic Interventions/Progress Updates:    1) Engaged in ADL retraining with focus on initiation, sequencing, and attention to RUE during self-care tasks of bathing and dressing. Perineal hygiene completed at bed level with rolling Rt and Lt. Pt with increased initiation and sequencing of rolling with only initial cue. Pt incontinent of bladder requiring physical assist to clean. Supine to sit on EOB with min assist with pt requiring assist to advance RLE, however noted trace initiation. Bathing completed in sitting with close supervision, with pt scanning to Rt to wash RUE without any cue this session. UB dressing completed with assist to thread RUE, followed by pt completing rest of dressing task.  Continues to require assist to thread BLE and pull pants over hips due to decreased sitting and standing balance.  Encouraged pt to sit in recliner for next session, however pt shook her head and pointed to return to bed.  2) Engaged in therapeutic activity with focus on sitting balance at EOB and sequencing with card matching activity. Engaged in sorting cards by color, then increasing challenge to color and number with pt making 2 errors out of approx 50 cards with inability to correct error without questioning cues.  Pt with difficulty locating cards  in Rt visual field requiring increased time each time to scan to Rt in both matching by color and matching color and number.  Continue to address initiation, sequencing, and Rt attention during treatment sessions.  Therapy Documentation Precautions:  Precautions Precautions: Fall Precaution Comments: right hemiperesis, NPO and maintain HOB >30 deg; MONITOR VITALS; Alert RN for BP >180/>105 and hold activity for BP 220/120, monitor for signs of aspiration or abdominal pain and distention Restrictions Weight Bearing Restrictions: No Pain:  Pt with no c/o pain  See FIM for current functional status  Therapy/Group: Individual Therapy  Simonne Come 06/17/2013, 9:29 AM

## 2013-06-17 NOTE — Progress Notes (Signed)
Subjective/Complaints: 53 y.o. female with history of HTN, DM, bipolar disorder, who was admitted on 05/27/13 with right facial droop and aphasia. 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 2 D echo with EF 55-60%. Carotid dopplers without significant ICA stenosis. 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 Patient with progressive neurologic worsening on 05/29/13 pm with RUE >RLE weakness. Started on hypertonic fluids and transferred to ICU for closer monitoring. Follow up CCT without significant changes. Patient with decrease in LOC with worsening of symptoms  No problems over night. Belly expectedly sore. Review of Systems - Negative except aphasic not able to consistently do Y/N Objective: Vital Signs: Blood pressure 158/96, pulse 95, temperature 98 F (36.7 C), temperature source Oral, resp. rate 16, weight 72.576 kg (160 lb), SpO2 98.00%. Ir Gastrostomy Tube Mod Sed  06/16/2013   CLINICAL DATA:  Stroke  EXAM: PERCUTANEOUS GASTROSTOMY  FLUOROSCOPY TIME:  3 min and 12 seconds  MEDICATIONS AND MEDICAL HISTORY: Versed 2 mg, Fentanyl 100 mcg.  ANESTHESIA/SEDATION: Moderate sedation time: 10 minutes  CONTRAST:  10 cc Omnipaque 300  PROCEDURE: The procedure, risks, benefits, and alternatives were explained to the patient. Questions regarding the procedure were encouraged and answered. The patient understands and consents to the procedure.  The epigastrium was prepped with Betadine in a sterile fashion, and a sterile drape was applied covering the operative field. A sterile gown and sterile gloves were used for the procedure.  A  5-French orogastric tube is placed under fluoroscopic guidance. Scout imaging of the abdomen confirms barium within the transverse colon.  The stomach was distended with gas. Under fluoroscopic guidance, an 18 gauge needle was utilized to puncture the anterior wall of the body of the stomach. An Amplatz wire was advanced through the needle passing a T fastener into the lumen of the stomach. The T fastener was secured for gastropexy. A 9-French sheath was inserted.  A snare was advanced through the 9-French sheath. A Britta Mccreedy was advanced through the orogastric tube. It was snared then pulled out the oral cavity, pulling the snare, as well. The leading edge of the gastrostomy was attached to the snare. It was then pulled down the esophagus and out the percutaneous site. It was secured in place. Contrast was injected. No complication.  FINDINGS: The image demonstrates placement of a 20-French pull-through type gastrostomy tube into the body of the stomach.  IMPRESSION: Successful 20 French pull-through gastrostomy.   Electronically Signed   By: Maryclare Bean M.D.   On: 06/16/2013 15:50   Dg Abd Portable 1v  06/16/2013   CLINICAL DATA:  Evaluate barium prior to potential percutaneous gastrostomy tube placement  EXAM: PORTABLE ABDOMEN - 1 VIEW  COMPARISON:  DG ABD PORTABLE 1V dated 06/03/2013  FINDINGS: Administered barium extends throughout the transverse colon. No evidence of enteric obstruction.  Nondiagnostic evaluation for pneumoperitoneum secondary to supine positioning and exclusion of the lower thorax.  Enteric tube tip and side port overlie the expected location of the gastric fundus. There is no definitive residual barium within the stomach given exclusion of the cranial aspect of the gastric fundus.  Unchanged bones.  IMPRESSION: Enteric contrast seen throughout the transverse colon.  Electronically Signed   By: Sandi Mariscal M.D.   On: 06/16/2013 08:01   Results for orders placed during the hospital encounter  of 06/04/13 (from the past 72 hour(s))  GLUCOSE, CAPILLARY     Status: Abnormal   Collection Time    06/14/13 12:02 PM      Result Value Ref Range   Glucose-Capillary 179 (*) 70 - 99 mg/dL  GLUCOSE, CAPILLARY     Status: Abnormal   Collection Time    06/14/13  1:54 PM      Result Value Ref Range   Glucose-Capillary 193 (*) 70 - 99 mg/dL  GLUCOSE, CAPILLARY     Status: Abnormal   Collection Time    06/14/13  5:00 PM      Result Value Ref Range   Glucose-Capillary 180 (*) 70 - 99 mg/dL  GLUCOSE, CAPILLARY     Status: Abnormal   Collection Time    06/14/13  8:03 PM      Result Value Ref Range   Glucose-Capillary 146 (*) 70 - 99 mg/dL  GLUCOSE, CAPILLARY     Status: Abnormal   Collection Time    06/15/13 12:17 AM      Result Value Ref Range   Glucose-Capillary 142 (*) 70 - 99 mg/dL  GLUCOSE, CAPILLARY     Status: Abnormal   Collection Time    06/15/13  4:05 AM      Result Value Ref Range   Glucose-Capillary 161 (*) 70 - 99 mg/dL  GLUCOSE, CAPILLARY     Status: Abnormal   Collection Time    06/15/13  7:54 AM      Result Value Ref Range   Glucose-Capillary 139 (*) 70 - 99 mg/dL  SURGICAL PCR SCREEN     Status: Abnormal   Collection Time    06/15/13 11:47 AM      Result Value Ref Range   MRSA, PCR NEGATIVE  NEGATIVE   Staphylococcus aureus POSITIVE (*) NEGATIVE   Comment:            The Xpert SA Assay (FDA     approved for NASAL specimens     in patients over 30 years of age),     is one component of     a comprehensive surveillance     program.  Test performance has     been validated by [employer] for patients greater     than or equal to 61 year old.     It is not intended     to diagnose infection nor to     guide or monitor treatment.  GLUCOSE, CAPILLARY     Status: Abnormal   Collection Time    06/15/13 11:54 AM      Result Value Ref Range   Glucose-Capillary 128 (*) 70 - 99 mg/dL  GLUCOSE, CAPILLARY     Status: Abnormal   Collection Time    06/15/13   4:09 PM      Result Value Ref Range   Glucose-Capillary 156 (*) 70 - 99 mg/dL   Comment 1 Documented in Chart    GLUCOSE, CAPILLARY     Status: Abnormal   Collection Time    06/15/13  8:25 PM      Result Value Ref Range   Glucose-Capillary 123 (*) 70 - 99 mg/dL  GLUCOSE, CAPILLARY     Status: Abnormal   Collection Time    06/16/13 12:08 AM  Result Value Ref Range   Glucose-Capillary 113 (*) 70 - 99 mg/dL  GLUCOSE, CAPILLARY     Status: None   Collection Time    06/16/13  4:43 AM      Result Value Ref Range   Glucose-Capillary 94  70 - 99 mg/dL  CBC     Status: None   Collection Time    06/16/13  7:00 AM      Result Value Ref Range   WBC 8.5  4.0 - 10.5 K/uL   RBC 4.48  3.87 - 5.11 MIL/uL   Hemoglobin 13.5  12.0 - 15.0 g/dL   HCT 39.5  36.0 - 46.0 %   MCV 88.2  78.0 - 100.0 fL   MCH 30.1  26.0 - 34.0 pg   MCHC 34.2  30.0 - 36.0 g/dL   RDW 11.6  11.5 - 15.5 %   Platelets 302  150 - 400 K/uL  PROTIME-INR     Status: None   Collection Time    06/16/13  7:00 AM      Result Value Ref Range   Prothrombin Time 12.7  11.6 - 15.2 seconds   INR 0.97  0.00 - 1.49  APTT     Status: None   Collection Time    06/16/13  7:00 AM      Result Value Ref Range   aPTT 28  24 - 37 seconds  BASIC METABOLIC PANEL     Status: Abnormal   Collection Time    06/16/13  7:00 AM      Result Value Ref Range   Sodium 139  137 - 147 mEq/L   Potassium 4.0  3.7 - 5.3 mEq/L   Chloride 99  96 - 112 mEq/L   CO2 26  19 - 32 mEq/L   Glucose, Bld 129 (*) 70 - 99 mg/dL   BUN 14  6 - 23 mg/dL   Creatinine, Ser 0.72  0.50 - 1.10 mg/dL   Calcium 9.2  8.4 - 10.5 mg/dL   GFR calc non Af Amer >90  >90 mL/min   GFR calc Af Amer >90  >90 mL/min   Comment: (NOTE)     The eGFR has been calculated using the CKD EPI equation.     This calculation has not been validated in all clinical situations.     eGFR's persistently <90 mL/min signify possible Chronic Kidney     Disease.  GLUCOSE, CAPILLARY     Status:  Abnormal   Collection Time    06/16/13  7:52 AM      Result Value Ref Range   Glucose-Capillary 139 (*) 70 - 99 mg/dL  GLUCOSE, CAPILLARY     Status: Abnormal   Collection Time    06/16/13 11:39 AM      Result Value Ref Range   Glucose-Capillary 143 (*) 70 - 99 mg/dL  GLUCOSE, CAPILLARY     Status: Abnormal   Collection Time    06/16/13  4:43 PM      Result Value Ref Range   Glucose-Capillary 145 (*) 70 - 99 mg/dL  GLUCOSE, CAPILLARY     Status: Abnormal   Collection Time    06/16/13  7:57 PM      Result Value Ref Range   Glucose-Capillary 112 (*) 70 - 99 mg/dL  GLUCOSE, CAPILLARY     Status: None   Collection Time    06/17/13 12:03 AM      Result Value Ref Range   Glucose-Capillary  97  70 - 99 mg/dL  GLUCOSE, CAPILLARY     Status: Abnormal   Collection Time    06/17/13  4:06 AM      Result Value Ref Range   Glucose-Capillary 109 (*) 70 - 99 mg/dL  GLUCOSE, CAPILLARY     Status: Abnormal   Collection Time    06/17/13  7:45 AM      Result Value Ref Range   Glucose-Capillary 110 (*) 70 - 99 mg/dL     HEENT: normal and NG tube left nares, no drainage or skin breakdown Cardio: RRR and no murmur Resp: CTA B/L and unlabored GI: BS positive and non distended, tender around G tube site, minimal drainage Extremity:  Pulses positive and No Edema Skin:   Intact Neuro: alert , Flat, Cranial Nerve Abnormalities R central 7, Abnormal Sensory  Abnormal Motor 0/5 RUE and RLE except trace knee ext, normal on Left and Aphasic Opens mouth to command but does not protrude tongue, then perseverates on mouth opening when asked to move arm Musc/Skel:  Other no pain with R shoulder ROM Gen NAD   Assessment/Plan: 1. Functional deficits secondary to Left MCA infarct which require 3+ hours per day of interdisciplinary therapy in a comprehensive inpatient rehab setting. Physiatrist is providing close team supervision and 24 hour management of active medical problems listed below. Physiatrist  and rehab team continue to assess barriers to discharge/monitor patient progress toward functional and medical goals.  FIM: FIM - Bathing Bathing Steps Patient Completed: Chest;Abdomen;Right upper leg;Left upper leg;Right Arm Bathing: 3: Mod-Patient completes 5-7 [id number] parts or 50-74%  FIM - Upper Body Dressing/Undressing Upper body dressing/undressing steps patient completed: Thread/unthread left sleeve of pullover shirt/dress Upper body dressing/undressing: 2: Max-Patient completed 25-49% of tasks FIM - Lower Body Dressing/Undressing Lower body dressing/undressing steps patient completed: Thread/unthread left pants leg Lower body dressing/undressing: 1: Total-Patient completed less than 25% of tasks  FIM - Toileting Toileting: 0: No continent bowel/bladder events this shift  FIM - TRadio producerDevices: Bedside commode Toilet Transfers: 3-To toilet/BSC: Mod A (lift or lower assist);3-From toilet/BSC: Mod A (lift or lower assist)  FIM - Bed/Chair Transfer Bed/Chair Transfer Assistive Devices: Arm rests;Bed rails Bed/Chair Transfer: 3: Bed > Chair or W/C: Mod A (lift or lower assist);3: Chair or W/C > Bed: Mod A (lift or lower assist)  FIM - Locomotion: Wheelchair Distance: 100 Locomotion: Wheelchair: 1: Travels less than 50 ft with maximal assistance (Pt: 25 - 49%) FIM - Locomotion: Ambulation Locomotion: Ambulation Assistive Devices: Other (comment) Ambulation/Gait Assistance: 2: Max assist Locomotion: Ambulation: 0: Activity did not occur  Comprehension Comprehension Mode: Auditory Comprehension: 2-Understands basic 25 - 49% of the time/requires cueing 51 - 75% of the time  Expression Expression Mode: Nonverbal Expression: 1-Expresses basis less than 25% of the time/requires cueing greater than 75% of the time.  Social Interaction Social Interaction: 2-Interacts appropriately 25 - 49% of time - Needs frequent redirection.  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 DVT Prophylaxis/Anticoagulation: Pharmaceutical: Lovenox  2. Pain Management: N/A  3. Bipolar disorder/Mood: No signs of distress reported but patient with poor awareness of deficits.  Monitor mood for now. LCSW to follow with patient and family.  4. Neuropsych: This patient is not capable of making decisions on her own behalf.  5. DM type 2: Will monitor with bid checks. Continue to titrate insulin as needed---will change with potential bolus feeding schedule. Use SSI for elevated BS.  6. HTN: adjust for better control avoiding hypotension with permissive high BP 180-200 range to prevent hypoperfusion/neuro worsening   titrating  labetalol carefully 7. H/o paranoid schizophrenia: Has been to [facility] in the past due to decompensation. Not on medications as this time--has been on respiridone in the past and has history of non-compliance per records  8. Dysphagia: s/p G-tube today---appreciate IR help   -begin using today 9. Resting Tachycardia:     On water flushes to avoid dehydration.   -BB  , improving/trending to tolerable levels LOS (Days) 13 A FACE TO FACE EVALUATION WAS PERFORMED  Journii Nierman T 06/17/2013, 8:42 AM

## 2013-06-17 NOTE — Progress Notes (Signed)
S/p G-tube placement Tolerated well Pt reports mild soreness today.  BP 158/96  Pulse 95  Temp(Src) 98 F (36.7 C) (Oral)  Resp 16  Wt 160 lb (72.576 kg)  SpO2 98% G-tube intact, site clean, no hematoma or leakage. Dressing dry.  S/p perc Gastrostomy tube 3/30 Ok to begin using for TF per nutritionist recs. Call IR if needed.  Ascencion Dike PA-C Interventional Radiology 06/17/2013 8:19 AM

## 2013-06-17 NOTE — Progress Notes (Addendum)
Physical Therapy Weekly Progress Note  Patient Details  Name: [name] MRN: [id number] Date of Birth: 10-15-1960  Today's Date: 06/17/2013 Time: 1440-1550 Time Calculation (min): 70 min  Patient is making gradual progress and has met 3 of 5 short term goals. Pt is at min-mod A level for basic transfers and bed mobility and max A-total A for short distance ambulation and stair negotiation. Plan to initiate family training with son and daughter next week in preparation for d/c home.   Patient continues to demonstrate the following deficits: R hemiplegia UE > LE, apraxia, impaired timing and sequencing, muscle activation, coordination and motor planning, postural control, balance, gait, cognition, decreased initiation, RUE inattention, and decreased endurance, and therefore will continue to benefit from skilled PT intervention to enhance overall performance with activity tolerance, balance, postural control, ability to compensate for deficits, functional use of  right upper extremity and right lower extremity, attention, awareness and coordination.  Patient progressing toward long term goals.  Continue plan of care.  PT Short Term Goals Week 2:  PT Short Term Goal 1 (Week 2): Pt will perform bed mobility to L and R with min A and 50% cues for initiation and sequencing PT Short Term Goal 1 - Progress (Week 2): Met PT Short Term Goal 2 (Week 2): Pt will perform bed <> w/c transfers with min A and 50% cues for initiation and sequencing PT Short Term Goal 2 - Progress (Week 2): Met PT Short Term Goal 3 (Week 2): Pt will perform w/c mobility x 50' on unit with L hemi technique and mod A with 50% cues for sequencing PT Short Term Goal 3 - Progress (Week 2): Met PT Short Term Goal 4 (Week 2): Pt will perform gait x 25' with LRAD and mod A PT Short Term Goal 4 - Progress (Week 2): Partly met PT Short Term Goal 5 (Week 2): Pt will negotiate 3 stairs with one rail and max A x 1 and 50% cues for  sequencing PT Short Term Goal 5 - Progress (Week 2): Partly met Week 3:  PT Short Term Goal 1 (Week 3): Pt will perform bed mobility to L and R with min A and 25% cues for initiation and sequencing PT Short Term Goal 2 (Week 3): Pt will perform bed < w/c transfers with min A and 25% cues for initiation and sequencing PT Short Term Goal 3 (Week 3): Pt will perform w/c mobility x 150' on unit with L hemi technique with SBA and 25% cues for sequencing PT Short Term Goal 4 (Week 3): Pt will perform gait x 25' with LRAD and mod A PT Short Term Goal 5 (Week 3): Pt will negotiate 3 stairs with one rail and max A and 50% cues for sequencing  Skilled Therapeutic Interventions/Progress Updates:   Pt received semi reclined in bed, agreeable to therapy with head nod. Pt transferred supine > sit with HOB elevated and minA. Seated BP 197/116, HR 91, 96%Sp02. After sitting EOB for 5 min, rechecked with BP 199/116. RN notified. Checked with larger cuff with pt's BP 188/114, still exceeding diastolic BP parameters. RN stated pt just received meds for BP and to continue with therapy. Pt propelled w/c room > gym 170 ft using L hemi technique with min assist for avoiding obstacles on R and pt able to redirect 75% of time with verbal cues to steer using R foot. In gym, pt BP 173/106. Gait training in hallway x 25' with LUE support on  wall rail and therapist under RUE and mod-max A with focus on pt initiating R step and activation of R glute and quad during stance and step-through pattern. Followed by retro gait training x 25 ft with focus on activation of R glute and terminal hip extension and max A. Pt transferred mat <> w/c stand pivot with minA. On mat, pt performed bed mobility, rolling, and bridging for repositioning in bed and improved core activation with verbal cues for sequencing and SBA/occasional minA. Pt transferred sit > stand > tall kneeling on mat with bench placed in front with max A x 2. Pt able to maintain  tall kneeling with LUE supported on bench and minA with verbal/tactile cues for glute activation and hip extension to neutral x 2-3 min. Pt returned to room in w/c, stand pivot and sit >supine with min A, pt repositioned in bed using LUE/BLEs and verbal cues for sequencing, and left semi reclined in bed with all needs within reach and bed alarm on.   Therapy Documentation Precautions:  Precautions Precautions: Fall Precaution Comments: right hemiperesis, NPO and maintain HOB >30 deg; MONITOR VITALS; Alert RN for BP >180/>105 and hold activity for BP 220/120, monitor for signs of aspiration or abdominal pain and distention Restrictions Weight Bearing Restrictions: No Vital Signs: Therapy Vitals Pulse Rate: 91 BP: 197/116 mmHg Patient Position, if appropriate: Sitting Oxygen Therapy SpO2: 96 % O2 Device: None (Room air) Pain: Pain Assessment Pain Assessment: No/denies pain Pain Score: 0-No pain Locomotion : Ambulation Ambulation/Gait Assistance: 2: Max assist Wheelchair Mobility Distance: 170'    See FIM for current functional status  Therapy/Group: Individual Therapy  Laretta Alstrom 06/17/2013, 4:12 PM

## 2013-06-18 ENCOUNTER — Inpatient Hospital Stay (HOSPITAL_COMMUNITY): Payer: Medicaid Other | Admitting: Occupational Therapy

## 2013-06-18 ENCOUNTER — Inpatient Hospital Stay (HOSPITAL_COMMUNITY): Payer: Medicaid Other | Admitting: Physical Therapy

## 2013-06-18 ENCOUNTER — Inpatient Hospital Stay (HOSPITAL_COMMUNITY): Payer: Medicaid Other | Admitting: Speech Pathology

## 2013-06-18 DIAGNOSIS — I6340 Cerebral infarction due to embolism of unspecified cerebral artery: Secondary | ICD-10-CM

## 2013-06-18 DIAGNOSIS — E119 Type 2 diabetes mellitus without complications: Secondary | ICD-10-CM

## 2013-06-18 DIAGNOSIS — I10 Essential (primary) hypertension: Secondary | ICD-10-CM

## 2013-06-18 DIAGNOSIS — F319 Bipolar disorder, unspecified: Secondary | ICD-10-CM

## 2013-06-18 LAB — GLUCOSE, CAPILLARY
GLUCOSE-CAPILLARY: 118 mg/dL — AB (ref 70–99)
Glucose-Capillary: 83 mg/dL (ref 70–99)
Glucose-Capillary: 108 mg/dL — ABNORMAL HIGH (ref 70–99)
Glucose-Capillary: 203 mg/dL — ABNORMAL HIGH (ref 70–99)
Glucose-Capillary: 96 mg/dL (ref 70–99)

## 2013-06-18 MED ORDER — BACLOFEN 5 MG HALF TABLET
5.0000 mg | ORAL_TABLET | Freq: Two times a day (BID) | ORAL | Status: DC
  Administered 2013-06-18 – 2013-07-03 (×31): 5 mg via ORAL
  Administered 2013-07-03: 22:00:00 via ORAL
  Administered 2013-07-04 – 2013-07-08 (×9): 5 mg via ORAL
  Filled 2013-06-18 (×46): qty 1

## 2013-06-18 MED ORDER — INSULIN ASPART 100 UNIT/ML ~~LOC~~ SOLN
4.0000 [IU] | Freq: Three times a day (TID) | SUBCUTANEOUS | Status: DC
  Administered 2013-06-18 – 2013-06-19 (×3): 4 [IU] via SUBCUTANEOUS

## 2013-06-18 MED ORDER — INSULIN ASPART 100 UNIT/ML ~~LOC~~ SOLN: 0.0000 [IU] | Freq: Three times a day (TID) | SUBCUTANEOUS | Status: DC

## 2013-06-18 NOTE — Patient Care Conference (Signed)
Inpatient RehabilitationTeam Conference and Plan of Care Update Date: 06/18/2013   Time: 10:10 AM    Patient Name: [name]      Medical Record Number: [id number]  Date of Birth: September 19, 1960 Sex: Female         Room/Bed: 4M09C/4M09C-01 Payor Info: Payor: MEDICAID POTENTIAL / Plan: MEDICAID POTENTIAL / Product Type: *No Product type* /    Admitting Diagnosis: L MCA CVA  Admit Date/Time:  06/04/2013  2:30 PM Admission Comments: No comment available   Primary Diagnosis:  <principal problem not specified> Principal Problem: <principal problem not specified>  Patient Active Problem List   Diagnosis Date Noted  . CVA (cerebral infarction) 06/04/2013  . Dysphagia 06/02/2013  . Benign polycythemia 06/02/2013  . Malignant hypertension 05/28/2013  . DM (diabetes mellitus) type II controlled, neurological manifestation 05/27/2013  . Acute embolic stroke 66/44/0347  . 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 Hypertensive emergency 05/27/2013  . Stroke 05/27/2013  . Aphasia complicating stroke 42/59/5638    Expected Discharge Date: Expected Discharge Date: 07/02/13  Team Members Present: Physician leading conference: Dr. Alger Simons Social Worker Present: Ovidio Kin, LCSW Nurse Present: Rayetta Pigg, RN PT Present: Cameron Sprang, PT OT Present: Simonne Come, Starling Manns, Maryella Shivers, OT SLP Present: Gunnar Fusi, SLP PPS Coordinator present : Daiva Nakayama, RN, CRRN     Current Status/Progress Goal Weekly Team Focus  Medical   still with profound dysphagia, g tube in. more alert  improve bp control, maximixe nutrition  g-tube mgt, bp control   Bowel/Bladder   Incontinent of bowel/ bladder.   Managed bowel & bladder with mini assist  Monitor pattern , Timed toileting   Swallow/Nutrition/ Hydration   Max assist contexual cues to initiate swallows in functional self-feeding task  Max assist to participcate in dysphagia therapy  continue to increase initiation as  cues are faded    ADL's   mod assist bathing and UB dressing, total assist LB dressing, mod assist stand pivot transfers  min assist overall  activity tolerance, sitting and standing balance, transfers, ADL retraining   Mobility   Min-mod A bed mobility and bed <> w/c transfers squat pivot, max-total A gait and stair negotiation  Min A overall  Re-education and activation of RLE during mobilitly, transfers, gait, w/c mobility   Communication   Total assist   Mod assist   continue to increase initiation in tha context of fucntional tasks; continue progress with object id in field of 2   Safety/Cognition/ Behavioral Observations  Total assist   Mod assist   increase right attention, initiaiton and basic problem solving   Pain   No C/O pain  Pain level of 2 or less  Assess pain every shift and intervene appropriately   Skin   No Skin issues, Drsg to peg site dry and intact  No skin breakdown  Asses skin q shift and turn q2hrs      *See Care Plan and progress notes for long and short-term goals.  Barriers to Discharge: profound right sided deficits with dsyphagia and aphasia    Possible Resolutions to Barriers:  family ed, full-time care    Discharge Planning/Teaching Needs:  Daughter to begin coming in next week, to begin learning pt's care-unsire if realizes the amount of care of pt will require.      Team Discussion:  Making better progress with swallow since NG tube out-PEG now.  Progressing slowly-begin family education-see if children can do it  Revisions to Treatment  Plan:  NOne   Continued Need for Acute Rehabilitation Level of Care: The patient requires daily medical management by a physician with specialized training in physical medicine and rehabilitation for the following conditions: Daily direction of a multidisciplinary physical rehabilitation program to ensure safe treatment while eliciting the highest outcome that is of practical value to the patient.: Yes Daily  analysis of laboratory values and/or radiology reports with any subsequent need for medication adjustment of medical intervention for : Neurological problems;Cardiac problems  [name], [name] 06/18/2013, 3:16 PM

## 2013-06-18 NOTE — Progress Notes (Signed)
Speech Language Pathology Daily Session Note  Patient Details  Name: [name] MRN: [id number] Date of Birth: 1960-07-03  Today's Date: 06/18/2013 Time: 3846-6599 Time Calculation (min): 40 min  Short Term Goals: Week 2: SLP Short Term Goal 1 (Week 2): Patient will initiiate 20 swallows per session with contextual and Max multi-modal clinician cues. SLP Short Term Goal 2 (Week 2): Patient will follow 1-step directions with contextual and Max multi-modal clinician cues. SLP Short Term Goal 3 (Week 2): Patient will identify object from a field of 2 with Mod multi-modal cues. SLP Short Term Goal 4 (Week 2): Patient will approximate speech sounds during automatic speech tasks with Max assist multi-modal cues.  Skilled Therapeutic Interventions: Skilled treatment session focused on addressing dysphagia and cognitive-linguistic goals.  Patient followed 1-step directions with Mod assist verbal, visual and tactile cues during oral care via suctioning.  SLP set-up a natural functional task of consuming ice from a cup to elicit swallows. Patient demonstrated some oral manipulation of bolus with 1-3 swallow initiations per trial.  Patient demonstrated strong reflexive cough in 1/15 ice chip trials and then refused any further trials today.  Patient then identified objects from a field of two with Max assist verbal and visual cues.  SLP facilitated session with automatic verbal sequences such as singing and counting; patient participated with labial movement following Mod clinician cues but no voicing.  Of note, patient initiated "mmm" in agreement automatically at beginning of session.  Continue with current plan of care.   FIM:  Comprehension Comprehension Mode: Auditory Comprehension: 2-Understands basic 25 - 49% of the time/requires cueing 51 - 75% of the time Expression Expression Mode: Nonverbal Expression: 1-Expresses basis less than 25% of the time/requires cueing greater than 75% of the  time. Social Interaction Social Interaction: 2-Interacts appropriately 25 - 49% of time - Needs frequent redirection. Problem Solving Problem Solving: 2-Solves basic 25 - 49% of the time - needs direction more than half the time to initiate, plan or complete simple activities Memory Memory: 2-Recognizes or recalls 25 - 49% of the time/requires cueing 51 - 75% of the time FIM - Eating Eating Activity: 1: Helper performs IV, parenteral, or tube feeding  Pain Pain Assessment Pain Assessment: No/denies pain Faces Pain Scale: No hurt  Therapy/Group: Individual Therapy  Carmelia Roller., [place] [phone]  [place] 06/18/2013, 4:09 PM

## 2013-06-18 NOTE — Progress Notes (Signed)
Physical Therapy Note  Patient Details  Name: RAYOLA EVERHART MRN: [id number] Date of Birth: May 25, 1960 Today's Date: 06/18/2013  Time: 825-857 32 minutes  1:1 No c/o pain.  BP supine 152/92, HR 91.  Seated balance with reaching task with close supervision, occasional min A for far reaching out of BOS.  Transfers bed <> w/c and w/c <> mat with min A, facilitation for wt shifts and balance, cues for set up.  Standing balance with placing L LE on step to activate R LE mm with tactile cues and mod/max A for balance.  Pt responds well to tactile cues for glute and quad activation with repetition.  BP during treatment 167/108, HR 96.   Joyanna Kleman 06/18/2013, 8:58 AM

## 2013-06-18 NOTE — Progress Notes (Signed)
Physical Therapy Session Note  Patient Details  Name: [name] MRN: [id number] Date of Birth: 1960-10-23  Today's Date: 06/18/2013 Time: 1400-1500 Time Calculation (min): 60 min  Short Term Goals: Week 3:  PT Short Term Goal 1 (Week 3): Pt will perform bed mobility to L and R with min A and 25% cues for initiation and sequencing PT Short Term Goal 2 (Week 3): Pt will perform bed < w/c transfers with min A and 25% cues for initiation and sequencing PT Short Term Goal 3 (Week 3): Pt will perform w/c mobility x 150' on unit with L hemi technique with SBA and 25% cues for sequencing PT Short Term Goal 4 (Week 3): Pt will perform gait x 25' with LRAD and mod A PT Short Term Goal 5 (Week 3): Pt will negotiate 3 stairs with one rail and max A and 50% cues for sequencing  Skilled Therapeutic Interventions/Progress Updates:   Pt received sitting in recliner, daughter present and attending therapy with pt, asking appropriate questions about what she can do to help pt progress when in room. Session focused on w/c mobility for increased independence and gait training and NMR for improved RLE weightbearing, muscle activation, initiation, sustained attention, and attending to R side of body. Pt performed all functional transfers with minA.  Pt propelled w/c room <> gym 150 ft x 2 using L hemi technique with supervision-mod assist and pt able to redirect 75% of time with verbal cues. Gait training in hallway 25' and 20' using R ace wrap DF assist with LUE support on wall rail and therapist under RUE and mod A with focus on activation of R knee ext in stance and step-through pattern. Therapist assisting with 25% RLE placement and therapist's leg blocking pt from hyperextending R knee. Pt transferred to NuStep for RLE NMR for increased motor control, timing, sequencing, coordination, and forced use of RLE using BLEs only with tactile/verbal cues for activation of R extensors and therapist preventing R LE  abduction x 5 min Level 1 and with LUE/BLE x 2 min Level 2. Pt returned to room in w/c with daughter requesting for pt to get in bed secondary to fatigue. Pt repositioned self in bed using LUE/BLEs and verbal cues for sequencing, and left semi reclined in bed with all needs within reach and daughter present.   Therapy Documentation Precautions:  Precautions Precautions: Fall Precaution Comments: right hemiperesis, NPO and maintain HOB >30 deg; MONITOR VITALS; Alert RN for BP >180/>105 and hold activity for BP 220/120, monitor for signs of aspiration or abdominal pain and distention Restrictions Weight Bearing Restrictions: No Vital Signs: Pulse Rate: 88 BP: 160/102 Patient Position, if appropriate:Sitting Pain: Pain Assessment Pain Assessment: No/denies pain Faces Pain Scale: No hurt  See FIM for current functional status  Therapy/Group: Individual Therapy  Laretta Alstrom 06/18/2013, 4:50 PM

## 2013-06-18 NOTE — Progress Notes (Addendum)
Subjective/Complaints: 53 y.o. female with history of HTN, DM, bipolar disorder, who was admitted on 05/27/13 with right facial droop and aphasia. 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 2 D echo with EF 55-60%. Carotid dopplers without significant ICA stenosis. 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 Patient with progressive neurologic worsening on 05/29/13 pm with RUE >RLE weakness. Started on hypertonic fluids and transferred to ICU for closer monitoring. Follow up CCT without significant changes. Patient with decrease in LOC with worsening of symptoms  No problems over night. Belly expectedly sore. Review of Systems - Negative except aphasic not able to consistently do Y/N Objective: Vital Signs: Blood pressure 211/114, pulse 79, temperature 98.5 F (36.9 C), temperature source Oral, resp. rate 17, weight 71.7 kg (158 lb 1.1 oz), SpO2 97.00%. Ir Gastrostomy Tube Mod Sed  06/16/2013   CLINICAL DATA:  Stroke  EXAM: PERCUTANEOUS GASTROSTOMY  FLUOROSCOPY TIME:  3 min and 12 seconds  MEDICATIONS AND MEDICAL HISTORY: Versed 2 mg, Fentanyl 100 mcg.  ANESTHESIA/SEDATION: Moderate sedation time: 10 minutes  CONTRAST:  10 cc Omnipaque 300  PROCEDURE: The procedure, risks, benefits, and alternatives were explained to the patient. Questions regarding the procedure were encouraged and answered. The patient understands and consents to the procedure.  The epigastrium was prepped with Betadine in a sterile fashion, and a sterile drape was applied covering the operative field. A sterile gown and sterile gloves were used for the procedure.   A 5-French orogastric tube is placed under fluoroscopic guidance. Scout imaging of the abdomen confirms barium within the transverse colon.  The stomach was distended with gas. Under fluoroscopic guidance, an 18 gauge needle was utilized to puncture the anterior wall of the body of the stomach. An Amplatz wire was advanced through the needle passing a T fastener into the lumen of the stomach. The T fastener was secured for gastropexy. A 9-French sheath was inserted.  A snare was advanced through the 9-French sheath. A Britta Mccreedy was advanced through the orogastric tube. It was snared then pulled out the oral cavity, pulling the snare, as well. The leading edge of the gastrostomy was attached to the snare. It was then pulled down the esophagus and out the percutaneous site. It was secured in place. Contrast was injected. No complication.  FINDINGS: The image demonstrates placement of a 20-French pull-through type gastrostomy tube into the body of the stomach.  IMPRESSION: Successful 20 French pull-through gastrostomy.   Electronically Signed   By: Maryclare Bean M.D.   On: 06/16/2013 15:50   Results for orders placed during the hospital encounter of 06/04/13 (from the past 72 hour(s))  SURGICAL PCR SCREEN     Status: Abnormal   Collection Time    06/15/13 11:47 AM      Result Value Ref Range   MRSA, PCR NEGATIVE  NEGATIVE   Staphylococcus aureus POSITIVE (*) NEGATIVE   Comment:            The Xpert SA Assay (FDA     approved for NASAL specimens     in patients over 70 years of age),     is one component of     a  comprehensive surveillance     program.  Test performance has     been validated by [facility] for patients greater     than or equal to 56 year old.     It is not intended     to diagnose infection nor to     guide or monitor treatment.  GLUCOSE, CAPILLARY     Status: Abnormal   Collection Time    06/15/13 11:54 AM      Result Value Ref Range   Glucose-Capillary 128 (*) 70 - 99 mg/dL   GLUCOSE, CAPILLARY     Status: Abnormal   Collection Time    06/15/13  4:09 PM      Result Value Ref Range   Glucose-Capillary 156 (*) 70 - 99 mg/dL   Comment 1 Documented in Chart    GLUCOSE, CAPILLARY     Status: Abnormal   Collection Time    06/15/13  8:25 PM      Result Value Ref Range   Glucose-Capillary 123 (*) 70 - 99 mg/dL  GLUCOSE, CAPILLARY     Status: Abnormal   Collection Time    06/16/13 12:08 AM      Result Value Ref Range   Glucose-Capillary 113 (*) 70 - 99 mg/dL  GLUCOSE, CAPILLARY     Status: None   Collection Time    06/16/13  4:43 AM      Result Value Ref Range   Glucose-Capillary 94  70 - 99 mg/dL  CBC     Status: None   Collection Time    06/16/13  7:00 AM      Result Value Ref Range   WBC 8.5  4.0 - 10.5 K/uL   RBC 4.48  3.87 - 5.11 MIL/uL   Hemoglobin 13.5  12.0 - 15.0 g/dL   HCT 39.5  36.0 - 46.0 %   MCV 88.2  78.0 - 100.0 fL   MCH 30.1  26.0 - 34.0 pg   MCHC 34.2  30.0 - 36.0 g/dL   RDW 11.6  11.5 - 15.5 %   Platelets 302  150 - 400 K/uL  PROTIME-INR     Status: None   Collection Time    06/16/13  7:00 AM      Result Value Ref Range   Prothrombin Time 12.7  11.6 - 15.2 seconds   INR 0.97  0.00 - 1.49  APTT     Status: None   Collection Time    06/16/13  7:00 AM      Result Value Ref Range   aPTT 28  24 - 37 seconds  BASIC METABOLIC PANEL     Status: Abnormal   Collection Time    06/16/13  7:00 AM      Result Value Ref Range   Sodium 139  137 - 147 mEq/L   Potassium 4.0  3.7 - 5.3 mEq/L   Chloride 99  96 - 112 mEq/L   CO2 26  19 - 32 mEq/L   Glucose, Bld 129 (*) 70 - 99 mg/dL   BUN 14  6 - 23 mg/dL   Creatinine, Ser 0.72  0.50 - 1.10 mg/dL   Calcium 9.2  8.4 - 10.5 mg/dL   GFR calc non Af Amer >90  >90 mL/min   GFR calc Af Amer >90  >90 mL/min   Comment: (NOTE)     The eGFR has been calculated using the CKD EPI equation.  This calculation has not been validated in all clinical situations.     eGFR's persistently <90 mL/min  signify possible Chronic Kidney     Disease.  GLUCOSE, CAPILLARY     Status: Abnormal   Collection Time    06/16/13  7:52 AM      Result Value Ref Range   Glucose-Capillary 139 (*) 70 - 99 mg/dL  GLUCOSE, CAPILLARY     Status: Abnormal   Collection Time    06/16/13 11:39 AM      Result Value Ref Range   Glucose-Capillary 143 (*) 70 - 99 mg/dL  GLUCOSE, CAPILLARY     Status: Abnormal   Collection Time    06/16/13  4:43 PM      Result Value Ref Range   Glucose-Capillary 145 (*) 70 - 99 mg/dL  GLUCOSE, CAPILLARY     Status: Abnormal   Collection Time    06/16/13  7:57 PM      Result Value Ref Range   Glucose-Capillary 112 (*) 70 - 99 mg/dL  GLUCOSE, CAPILLARY     Status: None   Collection Time    06/17/13 12:03 AM      Result Value Ref Range   Glucose-Capillary 97  70 - 99 mg/dL  GLUCOSE, CAPILLARY     Status: Abnormal   Collection Time    06/17/13  4:06 AM      Result Value Ref Range   Glucose-Capillary 109 (*) 70 - 99 mg/dL  GLUCOSE, CAPILLARY     Status: Abnormal   Collection Time    06/17/13  7:45 AM      Result Value Ref Range   Glucose-Capillary 110 (*) 70 - 99 mg/dL  GLUCOSE, CAPILLARY     Status: Abnormal   Collection Time    06/17/13 11:49 AM      Result Value Ref Range   Glucose-Capillary 107 (*) 70 - 99 mg/dL  GLUCOSE, CAPILLARY     Status: Abnormal   Collection Time    06/17/13  4:17 PM      Result Value Ref Range   Glucose-Capillary 105 (*) 70 - 99 mg/dL  GLUCOSE, CAPILLARY     Status: Abnormal   Collection Time    06/17/13  8:00 PM      Result Value Ref Range   Glucose-Capillary 135 (*) 70 - 99 mg/dL   Comment 1 Notify RN    GLUCOSE, CAPILLARY     Status: Abnormal   Collection Time    06/17/13 11:40 PM      Result Value Ref Range   Glucose-Capillary 142 (*) 70 - 99 mg/dL   Comment 1 Notify RN    GLUCOSE, CAPILLARY     Status: None   Collection Time    06/18/13  3:43 AM      Result Value Ref Range   Glucose-Capillary 83  70 - 99 mg/dL    Comment 1 Notify RN    GLUCOSE, CAPILLARY     Status: Abnormal   Collection Time    06/18/13  8:01 AM      Result Value Ref Range   Glucose-Capillary 108 (*) 70 - 99 mg/dL   Comment 1 Notify RN       HEENT: normal and NG tube left nares, no drainage or skin breakdown Cardio: RRR and no murmur Resp: CTA B/L and unlabored GI: BS positive and non distended, G tube intact. Bumper very tight. Generally tender at sight. No abnl drainage Extremity:  Pulses positive  and No Edema Skin:   Intact Neuro: alert ,smiling, Cranial Nerve Abnormalities R central 7, Abnormal Sensory  Abnormal Motor 0/5 RUE and RLE except trace knee ext, normal on Left and Aphasic. 1- 1+ tone RE including bicep,wrist/fingers, hamstring/gastroc   Musc/Skel:  Other no pain with R shoulder ROM Gen NAD. Very alert   Assessment/Plan: 1. Functional deficits secondary to Left MCA infarct which require 3+ hours per day of interdisciplinary therapy in a comprehensive inpatient rehab setting. Physiatrist is providing close team supervision and 24 hour management of active medical problems listed below. Physiatrist and rehab team continue to assess barriers to discharge/monitor patient progress toward functional and medical goals.  FIM: FIM - Bathing Bathing Steps Patient Completed: Chest;Abdomen;Right upper leg;Left upper leg;Right Arm Bathing: 3: Mod-Patient completes 5-7 41f10 parts or 50-74%  FIM - Upper Body Dressing/Undressing Upper body dressing/undressing steps patient completed: Thread/unthread left sleeve of pullover shirt/dress;Put head through opening of pull over shirt/dress Upper body dressing/undressing: 3: Mod-Patient completed 50-74% of tasks FIM - Lower Body Dressing/Undressing Lower body dressing/undressing steps patient completed: Thread/unthread left pants leg Lower body dressing/undressing: 1: Total-Patient completed less than 25% of tasks  FIM - Toileting Toileting: 0: No continent bowel/bladder  events this shift  FIM - TRadio producerDevices: Bedside commode Toilet Transfers: 3-To toilet/BSC: Mod A (lift or lower assist);3-From toilet/BSC: Mod A (lift or lower assist)  FIM - Bed/Chair Transfer Bed/Chair Transfer Assistive Devices: Arm rests Bed/Chair Transfer: 4: Supine > Sit: Min A (steadying Pt. > 75%/lift 1 leg);4: Sit > Supine: Min A (steadying pt. > 75%/lift 1 leg);4: Bed > Chair or W/C: Min A (steadying Pt. > 75%);4: Chair or W/C > Bed: Min A (steadying Pt. > 75%)  FIM - Locomotion: Wheelchair Distance: 170' Locomotion: Wheelchair: 4: Travels 150 ft or more: maneuvers on rugs and over door sillls with minimal assistance (Pt.>75%) FIM - Locomotion: Ambulation Locomotion: Ambulation Assistive Devices: Other (comment) (L rail in hallway, PT on R) Ambulation/Gait Assistance: 2: Max assist Locomotion: Ambulation: 1: Travels less than 50 ft with maximal assistance (Pt: 25 - 49%) (25')  Comprehension Comprehension Mode: Auditory Comprehension: 2-Understands basic 25 - 49% of the time/requires cueing 51 - 75% of the time  Expression Expression Mode: Nonverbal Expression: 1-Expresses basis less than 25% of the time/requires cueing greater than 75% of the time.  Social Interaction Social Interaction: 2-Interacts appropriately 25 - 49% of time - Needs frequent redirection.  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 DVT Prophylaxis/Anticoagulation: Pharmaceutical: Lovenox  2. Pain Management: N/A  3. Bipolar disorder/Mood: No signs of distress reported but patient with poor awareness of deficits. Monitor mood for now. LCSW to  follow with patient and family.  4. Neuropsych: This patient is not capable of making decisions on her own behalf.  5. DM type 2: Will monitor with bid checks. Continue to titrate insulin as needed---will change to coordinate with bolus feeding schedule. Use SSI for elevated BS.  6. HTN: adjust for better control avoiding hypotension with permissive high BP 180-200 range to prevent hypoperfusion/neuro worsening   titrating  labetalol carefully 7. H/o paranoid schizophrenia: Has been to  BH in the past due to decompensation. Not on medications as this time--has been on respiridone in the past and has history of non-compliance per records  8. Dysphagia: s/p G-tube today---appreciate IR help   -on bolus feeds  -loosened bumper slightly which was very tight 9. Resting Tachycardia:     On water flushes to avoid dehydration.   -BB  , improving/trending to tolerable levels 10. Hypertonicity: add scheduled baclofen low dose  -right WHO   LOS (Days) 14 A FACE TO FACE EVALUATION WAS PERFORMED  Donna Snooks T 06/18/2013, 8:25 AM

## 2013-06-18 NOTE — Progress Notes (Signed)
Occupational Therapy Session Note  Patient Details  Name: Senai A Abello MRN: [id number] Date of Birth: 10/28/1960  Today's Date: 06/18/2013 Time: 1106-1202 Time Calculation (min): 56 min  Short Term Goals: Week 1:  OT Short Term Goal 1 (Week 1): Pt will complete bathing with mod assist OT Short Term Goal 1 - Progress (Week 1): Met OT Short Term Goal 2 (Week 1): Pt will complete UB dressing with mod assist OT Short Term Goal 2 - Progress (Week 1): Met OT Short Term Goal 3 (Week 1): Pt will complete LB dressing with mod assist OT Short Term Goal 3 - Progress (Week 1): Progressing toward goal OT Short Term Goal 4 (Week 1): Pt will complete toilet transfer with mod assist OT Short Term Goal 4 - Progress (Week 1): Met OT Short Term Goal 5 (Week 1): Pt will position RUE prior to transfers with min verbal cues OT Short Term Goal 5 - Progress (Week 1): Progressing toward goal  Skilled Therapeutic Interventions/Progress Updates:    Engaged in ADL retraining with focus on initiation, sequencing, and attention to RUE during self-care tasks of bathing and dressing. Pt received seated up in w/c with c/o discomfort due to quick release belt rubbing abdomen.  Bathing and dressing completed at sit > stand level at sink with use of mirror to increase attention to Rt body and secretions.  Pt with increased initiation 1 of 5 times to wipe mouth without any cues, requiring verbal cues rest of time.  UB dressing completed with assist to thread RUE, followed by pt completing rest of donning shirt. SIt > stand at sink to wash buttocks and don pants. Continues to require assist to thread BLE and pull pants over hips due to decreased sitting and standing balance.  Engaged in copying pattern with connect four with pt with increased ability to copy red/yellow pattern and able to identify errors with increased time when questioned. Pt left seated in w/c with quick release belt with daughter present.  Therapy  Documentation Precautions:  Precautions Precautions: Fall Precaution Comments: right hemiperesis, NPO and maintain HOB >30 deg; MONITOR VITALS; Alert RN for BP >180/>105 and hold activity for BP 220/120, monitor for signs of aspiration or abdominal pain and distention Restrictions Weight Bearing Restrictions: No Pain: Pain Assessment Pain Assessment: Faces Faces Pain Scale: Hurts a little bit Pain Type: Acute pain Pain Location: Abdomen Pain Orientation: Mid Pain Descriptors / Indicators: Tender Pain Onset: Gradual Pain Intervention(s): Refused;Repositioned  See FIM for current functional status  Therapy/Group: Individual Therapy  ,  06/18/2013, 12:36 PM  

## 2013-06-18 NOTE — Progress Notes (Signed)
Social Work Patient ID: [name], female   DOB: [birth year], 53 y.o.   MRN: [id number] Met with son and daughter who were here for SSD application.  Informed of team conference progression toward goals and need for family education to begin end of next week. Both agreeable to the plans and daughter to be here next week.  Will provide as much support as possible, due to pt's care is much for her two young adult children.

## 2013-06-19 ENCOUNTER — Inpatient Hospital Stay (HOSPITAL_COMMUNITY): Payer: Self-pay

## 2013-06-19 ENCOUNTER — Inpatient Hospital Stay (HOSPITAL_COMMUNITY): Payer: Medicaid Other

## 2013-06-19 ENCOUNTER — Inpatient Hospital Stay (HOSPITAL_COMMUNITY): Payer: Medicaid Other | Admitting: Occupational Therapy

## 2013-06-19 ENCOUNTER — Inpatient Hospital Stay (HOSPITAL_COMMUNITY): Payer: Medicaid Other | Admitting: Physical Therapy

## 2013-06-19 DIAGNOSIS — F319 Bipolar disorder, unspecified: Secondary | ICD-10-CM

## 2013-06-19 DIAGNOSIS — E119 Type 2 diabetes mellitus without complications: Secondary | ICD-10-CM

## 2013-06-19 DIAGNOSIS — I10 Essential (primary) hypertension: Secondary | ICD-10-CM

## 2013-06-19 DIAGNOSIS — I6340 Cerebral infarction due to embolism of unspecified cerebral artery: Secondary | ICD-10-CM

## 2013-06-19 LAB — GLUCOSE, CAPILLARY
GLUCOSE-CAPILLARY: 91 mg/dL (ref 70–99)
Glucose-Capillary: 75 mg/dL (ref 70–99)
Glucose-Capillary: 130 mg/dL — ABNORMAL HIGH (ref 70–99)

## 2013-06-19 MED ORDER — GLUCERNA 1.2 CAL PO LIQD
350.0000 mL | Freq: Four times a day (QID) | ORAL | Status: DC
  Administered 2013-06-19 – 2013-07-04 (×60): 350 mL
  Filled 2013-06-19 (×65): qty 474

## 2013-06-19 MED ORDER — INSULIN ASPART 100 UNIT/ML ~~LOC~~ SOLN
4.0000 [IU] | Freq: Three times a day (TID) | SUBCUTANEOUS | Status: DC
  Administered 2013-06-19 – 2013-06-26 (×25): 4 [IU] via SUBCUTANEOUS

## 2013-06-19 MED ORDER — INSULIN ASPART 100 UNIT/ML ~~LOC~~ SOLN
0.0000 [IU] | Freq: Three times a day (TID) | SUBCUTANEOUS | Status: DC
  Administered 2013-06-19 – 2013-06-23 (×7): 2 [IU] via SUBCUTANEOUS
  Administered 2013-06-24: 3 [IU] via SUBCUTANEOUS
  Administered 2013-06-25 – 2013-06-30 (×12): 2 [IU] via SUBCUTANEOUS
  Administered 2013-06-30 – 2013-07-02 (×6): 3 [IU] via SUBCUTANEOUS
  Administered 2013-07-02: 2 [IU] via SUBCUTANEOUS
  Administered 2013-07-02: 3 [IU] via SUBCUTANEOUS
  Administered 2013-07-03: 2 [IU] via SUBCUTANEOUS
  Administered 2013-07-03 (×2): 3 [IU] via SUBCUTANEOUS
  Administered 2013-07-04 (×2): 2 [IU] via SUBCUTANEOUS
  Administered 2013-07-04: 3 [IU] via SUBCUTANEOUS
  Administered 2013-07-05 (×2): 2 [IU] via SUBCUTANEOUS
  Administered 2013-07-05 – 2013-07-06 (×4): 3 [IU] via SUBCUTANEOUS
  Administered 2013-07-06: 2 [IU] via SUBCUTANEOUS
  Administered 2013-07-07: 5 [IU] via SUBCUTANEOUS
  Administered 2013-07-07: 3 [IU] via SUBCUTANEOUS
  Administered 2013-07-07: 5 [IU] via SUBCUTANEOUS
  Administered 2013-07-08: 3 [IU] via SUBCUTANEOUS
  Administered 2013-07-08: 2 [IU] via SUBCUTANEOUS

## 2013-06-19 NOTE — Progress Notes (Signed)
Speech Language Pathology Daily Session Note  Patient Details  Name: [name] MRN: [id number] Date of Birth: 1961-01-15  Today's Date: 06/19/2013 Time: 1194-1740 Time Calculation (min): 42 min  Short Term Goals: Week 2: SLP Short Term Goal 1 (Week 2): Patient will initiiate 20 swallows per session with contextual and Max multi-modal clinician cues. SLP Short Term Goal 2 (Week 2): Patient will follow 1-step directions with contextual and Max multi-modal clinician cues. SLP Short Term Goal 3 (Week 2): Patient will identify object from a field of 2 with Mod multi-modal cues. SLP Short Term Goal 4 (Week 2): Patient will approximate speech sounds during automatic speech tasks with Max assist multi-modal cues.  Skilled Therapeutic Interventions: Treatment session focused on dysphagia and cognitive lingusitic goals. Pt allowed independent consumption of ice chips via cup and spoon with close supervision and min verbal and visual cues to initiate task. 20 swallows or more elicited.  Automaticity with oral manipulation and swallow response improving throught session with decreased need for oral suction with each attempt.  Cough x2 over session.  SLP provided max tactile cues for pt to identify via pointing to son and friend present at bedside. Provided aphasia education to son when he became distressed pt did not "know his name."  Pt approximated speech/phonation by opening mouth in counting and singing tasks with music therapy and max kinestheic and visual cues provided. At end of session pt responding "mmmhmm" and "yeah" to SLP comments about session.    FIM:  Comprehension Comprehension Mode: Auditory Comprehension: 2-Understands basic 25 - 49% of the time/requires cueing 51 - 75% of the time Expression Expression Mode: Verbal Expression: 1-Expresses basis less than 25% of the time/requires cueing greater than 75% of the time. Social Interaction Social Interaction: 2-Interacts  appropriately 25 - 49% of time - Needs frequent redirection. Problem Solving Problem Solving: 2-Solves basic 25 - 49% of the time - needs direction more than half the time to initiate, plan or complete simple activities Memory Memory: 2-Recognizes or recalls 25 - 49% of the time/requires cueing 51 - 75% of the time FIM - Eating Eating Activity: 5: Supervision/cues  Pain Pain Assessment Pain Assessment: Faces Faces Pain Scale: No hurt  Therapy/Group: Individual Therapy  Adonia Porada, Katherene Ponto 06/19/2013, 3:44 PM

## 2013-06-19 NOTE — Progress Notes (Signed)
Occupational Therapy Weekly Progress Note  Patient Details  Name: [name] MRN: [id number] Date of Birth: 1960-09-06  Today's Date: 06/19/2013 Time: 6144-3154 Time Calculation (min): 55 min  Patient has met 4 of 5 short term goals.  Pt is making steady progress towards goals.  She is currently min-mod assist for stand pivot transfers and bed mobility, min assist for bathing and UB dressing, and mod-max assist for LB dressing.  Pt has been unable to complete bathing at shower level due to medically unstable with elevated BP and multiple tubes, has transitioned to PEG tube and may be able to attempt shower in near future.  Family training to begin with son and daughter next week as they plan to take her home.  Patient continues to demonstrate the following deficits: Rt hemiplegia UE > LE, apraxia, impaired timing and sequencing, muscle activation, coordination and motor planning, postural control, balance, cognition, decreased initiation, RUE inattention, and decreased endurance, and therefore will continue to benefit from skilled OT intervention to enhance overall performance with BADL and Reduce care partner burden.  Patient progressing toward long term goals..  Continue plan of care.  OT Short Term Goals Week 2:  OT Short Term Goal 1 (Week 2): Pt will complete bathing with min assist OT Short Term Goal 1 - Progress (Week 2): Met OT Short Term Goal 2 (Week 2): Pt will complete LB dressing with mod assist OT Short Term Goal 2 - Progress (Week 2): Met OT Short Term Goal 3 (Week 2): Pt will complete UB dressing with min assist OT Short Term Goal 3 - Progress (Week 2): Met OT Short Term Goal 4 (Week 2): Pt will position RUE prior to transfers with min verbal cues OT Short Term Goal 4 - Progress (Week 2): Progressing toward goal OT Short Term Goal 5 (Week 2): Pt will complete 1/3 toileting steps with mid verbal cues OT Short Term Goal 5 - Progress (Week 2): Met Week 3:  OT Short Term Goal  1 (Week 3): Pt will position RUE prior to transfers with min verbal cues OT Short Term Goal 2 (Week 3): Pt will complete LB dressing with min assist OT Short Term Goal 3 (Week 3): Pt will complete toilet transfer with min assist OT Short Term Goal 4 (Week 3): Pt will complete 3/3 toileting steps with mod verbal cues   Skilled Therapeutic Interventions/Progress Updates:    Engaged in ADL retraining with focus on sit > stand, standing balance, RUE attention, and increased participation in self-care tasks.  Bathing and dressing completed at sit > stand level at sink with min assist and occasional blocking at Rt knee due to RLE weakness in standing.  Pt with increased initiation with bathing, mod verbal and tactile cues for dressing tasks with pt able to don brief and pants this session.  Backward chaining with donning socks.  Educated on placement of RUE on sink during bathing to increase optimal positioning.  Pt returned to bed at end of session due to pt request to rest until PT session.  Therapy Documentation Precautions:  Precautions Precautions: Fall Precaution Comments: right hemiperesis, NPO and maintain HOB >30 deg; MONITOR VITALS; Alert RN for BP >180/>105 and hold activity for BP 220/120, monitor for signs of aspiration or abdominal pain and distention Restrictions Weight Bearing Restrictions: No General:   Vital Signs: Therapy Vitals Pulse Rate: 86 BP: 149/93 mmHg Patient Position, if appropriate: Sitting Pain: Pain Assessment Pain Assessment: Faces Faces Pain Scale: No hurt  See  FIM for current functional status  Therapy/Group: Individual Therapy  Simonne Come 06/19/2013, 12:04 PM

## 2013-06-19 NOTE — Progress Notes (Signed)
Physical Therapy Note  Patient Details  Name: [name] MRN: [id number] Date of Birth: 1960-08-14 Today's Date: 06/19/2013  Time: 1100-1155 55 minutes  1:1 No c/o pain.  Transfers with min A, manual facilitation for wt shifts, cues for safety and max cuing to attend to R UE and LE.  Gait at railing in hallway with mod A, manual facilitation for upright posture, assist to advance R LE, cues at hip for anterior translation in stance.  Supine NMR focusing on hip and R LE strengthening and control with trace mm activation with B PNF patterns, bridging and LTR.  Nu step for LE/UE strengthening and endurance x 5 minutes, pt improved ability to keep R hip in neutral alignment during activity. W/c mobility with min A for propeling with hemi technique.   Mckenleigh Tarlton 06/19/2013, 11:54 AM

## 2013-06-19 NOTE — Progress Notes (Signed)
Subjective/Complaints: 53 y.o. female with history of HTN, DM, bipolar disorder, who was admitted on 05/27/13 with right facial droop and aphasia. 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 2 D echo with EF 55-60%. Carotid dopplers without significant ICA stenosis. 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 Patient with progressive neurologic worsening on 05/29/13 pm with RUE >RLE weakness. Started on hypertonic fluids and transferred to ICU for closer monitoring. Follow up CCT without significant changes. Patient with decrease in LOC with worsening of symptoms  No problems over night. Remains aphasic/anarthric Review of Systems - Negative except aphasic not able to consistently do Y/N Objective: Vital Signs: Blood pressure 151/99, pulse 87, temperature 97.8 F (36.6 C), temperature source Oral, resp. rate 17, weight 73 kg (160 lb 15 oz), SpO2 100.00%. No results found. Results for orders placed during the hospital encounter of 06/04/13 (from the past 72 hour(s))  GLUCOSE, CAPILLARY     Status: Abnormal   Collection Time    06/16/13  7:52 AM      Result Value Ref Range   Glucose-Capillary 139 (*) 70 - 99 mg/dL  GLUCOSE, CAPILLARY     Status: Abnormal   Collection Time    06/16/13 11:39 AM      Result Value Ref Range   Glucose-Capillary 143 (*) 70 - 99 mg/dL  GLUCOSE, CAPILLARY     Status: Abnormal   Collection Time    06/16/13  4:43 PM      Result Value Ref Range   Glucose-Capillary 145 (*) 70 - 99 mg/dL  GLUCOSE, CAPILLARY     Status: Abnormal   Collection Time    06/16/13  7:57 PM      Result Value Ref Range    Glucose-Capillary 112 (*) 70 - 99 mg/dL  GLUCOSE, CAPILLARY     Status: None   Collection Time    06/17/13 12:03 AM      Result Value Ref Range   Glucose-Capillary 97  70 - 99 mg/dL  GLUCOSE, CAPILLARY     Status: Abnormal   Collection Time    06/17/13  4:06 AM      Result Value Ref Range   Glucose-Capillary 109 (*) 70 - 99 mg/dL  GLUCOSE, CAPILLARY     Status: Abnormal   Collection Time    06/17/13  7:45 AM      Result Value Ref Range   Glucose-Capillary 110 (*) 70 - 99 mg/dL  GLUCOSE, CAPILLARY     Status: Abnormal   Collection Time    06/17/13 11:49 AM      Result Value Ref Range   Glucose-Capillary 107 (*) 70 - 99 mg/dL  GLUCOSE, CAPILLARY     Status: Abnormal   Collection Time    06/17/13  4:17 PM      Result Value Ref Range   Glucose-Capillary 105 (*) 70 - 99 mg/dL  GLUCOSE, CAPILLARY     Status: Abnormal   Collection Time    06/17/13  8:00 PM      Result Value Ref Range   Glucose-Capillary 135 (*) 70 - 99 mg/dL   Comment 1 Notify RN    GLUCOSE, CAPILLARY  Status: Abnormal   Collection Time    06/17/13 11:40 PM      Result Value Ref Range   Glucose-Capillary 142 (*) 70 - 99 mg/dL   Comment 1 Notify RN    GLUCOSE, CAPILLARY     Status: None   Collection Time    06/18/13  3:43 AM      Result Value Ref Range   Glucose-Capillary 83  70 - 99 mg/dL   Comment 1 Notify RN    GLUCOSE, CAPILLARY     Status: Abnormal   Collection Time    06/18/13  8:01 AM      Result Value Ref Range   Glucose-Capillary 108 (*) 70 - 99 mg/dL   Comment 1 Notify RN    GLUCOSE, CAPILLARY     Status: Abnormal   Collection Time    06/18/13 12:02 PM      Result Value Ref Range   Glucose-Capillary 203 (*) 70 - 99 mg/dL   Comment 1 Notify RN    GLUCOSE, CAPILLARY     Status: None   Collection Time    06/18/13  4:53 PM      Result Value Ref Range   Glucose-Capillary 96  70 - 99 mg/dL   Comment 1 Notify RN    GLUCOSE, CAPILLARY     Status: Abnormal   Collection Time    06/18/13   8:51 PM      Result Value Ref Range   Glucose-Capillary 118 (*) 70 - 99 mg/dL   Comment 1 Notify RN       HEENT: normal and NG tube left nares, no drainage or skin breakdown Cardio: RRR and no murmur Resp: CTA B/L and unlabored GI: BS positive and non distended, G tube intact. Bumper very tight. Generally tender at sight. No abnl drainage Extremity:  Pulses positive and No Edema Skin:   Intact Neuro: alert ,smiling, Cranial Nerve Abnormalities R central 7, Abnormal Sensory  Abnormal Motor 0/5 RUE and RLE except trace knee ext, normal on Left and Aphasic. 1- 1+ tone RE including bicep,wrist/fingers, hamstring/gastroc   Musc/Skel:  Other no pain with R shoulder ROM Gen NAD. Very alert   Assessment/Plan: 1. Functional deficits secondary to Left MCA infarct which require 3+ hours per day of interdisciplinary therapy in a comprehensive inpatient rehab setting. Physiatrist is providing close team supervision and 24 hour management of active medical problems listed below. Physiatrist and rehab team continue to assess barriers to discharge/monitor patient progress toward functional and medical goals.  FIM: FIM - Bathing Bathing Steps Patient Completed: Chest;Abdomen;Right upper leg;Left upper leg;Right Arm;Front perineal area;Buttocks Bathing: 3: Mod-Patient completes 5-7 42f 10 parts or 50-74%  FIM - Upper Body Dressing/Undressing Upper body dressing/undressing steps patient completed: Thread/unthread left sleeve of pullover shirt/dress;Put head through opening of pull over shirt/dress;Pull shirt over trunk Upper body dressing/undressing: 4: Min-Patient completed 75 plus % of tasks FIM - Lower Body Dressing/Undressing Lower body dressing/undressing steps patient completed: Thread/unthread left pants leg Lower body dressing/undressing: 1: Total-Patient completed less than 25% of tasks  FIM - Toileting Toileting: 0: No continent bowel/bladder events this shift  FIM - Glass blower/designer Devices: Recruitment consultant Transfers: 3-To toilet/BSC: Mod A (lift or lower assist);3-From toilet/BSC: Mod A (lift or lower assist)  FIM - Bed/Chair Transfer Bed/Chair Transfer Assistive Devices: Arm rests Bed/Chair Transfer: 4: Sit > Supine: Min A (steadying pt. > 75%/lift 1 leg);4: Bed > Chair or W/C: Min A (steadying Pt. >  75%);4: Chair or W/C > Bed: Min A (steadying Pt. > 75%)  FIM - Locomotion: Wheelchair Distance: 150 Locomotion: Wheelchair: 3: Travels 150 ft or more: maneuvers on rugs and over door sills with moderate assistance  (Pt: 50 - 74%) FIM - Locomotion: Ambulation Locomotion: Ambulation Assistive Devices: Other (comment) (L rail in hallway, R LE ace wrap DF assist ) Ambulation/Gait Assistance: 2: Max assist Locomotion: Ambulation: 1: Travels less than 50 ft with maximal assistance (Pt: 25 - 49%) (25, 20)  Comprehension Comprehension Mode: Auditory Comprehension: 2-Understands basic 25 - 49% of the time/requires cueing 51 - 75% of the time  Expression Expression Mode: Nonverbal Expression: 1-Expresses basis less than 25% of the time/requires cueing greater than 75% of the time.  Social Interaction Social Interaction: 2-Interacts appropriately 25 - 49% of time - Needs frequent redirection.  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 DVT Prophylaxis/Anticoagulation: Pharmaceutical: Lovenox  2. Pain Management: N/A  3. Bipolar disorder/Mood: No signs of distress reported but patient with poor awareness of deficits. Monitor mood for now. LCSW to follow with patient and family.  4. Neuropsych: This patient is not  capable of making decisions on her own behalf.  5. DM type 2: Will monitor with bid checks. Continue to titrate insulin as needed---will change to coordinate with bolus feeding schedule. Use SSI for elevated BS.  6. HTN: adjust for better control avoiding hypotension with permissive high BP 180-200 range to prevent hypoperfusion/neuro worsening   titrating  labetalol carefully 7. H/o paranoid schizophrenia: Has been to [facility] in the past due to decompensation. Not on medications as this time--has been on respiridone in the past and has history of non-compliance per records  8. Dysphagia: s/p G-tube today---appreciate IR help   -on bolus feeds   9. Resting Tachycardia:     On water flushes to avoid dehydration.   -BB  , improving/trending to tolerable levels 10. Hypertonicity: add scheduled baclofen low dose  -right WHO, increasing elbow flexor tone   LOS (Days) 15 A FACE TO FACE EVALUATION WAS PERFORMED  KIRSTEINS,ANDREW E 06/19/2013, 7:32 AM

## 2013-06-19 NOTE — Progress Notes (Addendum)
Physical Therapy Session Note  Patient Details  Name: [name] MRN: [id number] Date of Birth: Dec 29, 1960  Today's Date: 06/19/2013 Time: 1520-1550 Time Calculation (min): 30 min  Skilled Therapeutic Interventions/Progress Updates:  1:1. Pt received lightly sleeping in bed, easy to wake and ready for therapy. Vitals taken 21min prior to PT session by nurse tech, RN aware. Pt req min A for t/f sup>sit EOB with cues to attend to R UE and squat pivot t/f bed<>w/c. Pt req mod A for w/c propulsion room>therapy gym. Attempted trial of R AFO during gait, however, pt's shoe would not appropriately accommodate AFO at this time. Pt req mod A for amb x30' w/ use of L rail, manual facilitation for weight shifting onto R LE, internal rotation of R LE to encourage use of hip flexors vs. Adductors during assisted advancement of R LE, use of ace wrap for DF assist. Hip flex targeted w/ pt placing R LE onto 1" step w/ mod A and manual facilitation. Pt sitting in w/c at end of session w/ all needs in reach, quick release belt in place and RN aware.   Therapy Documentation Precautions:  Precautions Precautions: Fall Precaution Comments: right hemiperesis, NPO and maintain HOB >30 deg; MONITOR VITALS; Alert RN for BP >180/>105 and hold activity for BP 220/120, monitor for signs of aspiration or abdominal pain and distention Restrictions Weight Bearing Restrictions: No  See FIM for current functional status  Therapy/Group: Individual Therapy  Gilmore Laroche 06/19/2013, 4:01 PM

## 2013-06-20 ENCOUNTER — Inpatient Hospital Stay (HOSPITAL_COMMUNITY): Payer: Medicaid Other | Admitting: Physical Therapy

## 2013-06-20 ENCOUNTER — Inpatient Hospital Stay (HOSPITAL_COMMUNITY): Payer: Medicaid Other | Admitting: Occupational Therapy

## 2013-06-20 ENCOUNTER — Inpatient Hospital Stay (HOSPITAL_COMMUNITY): Payer: Self-pay

## 2013-06-20 LAB — GLUCOSE, CAPILLARY
GLUCOSE-CAPILLARY: 118 mg/dL — AB (ref 70–99)
GLUCOSE-CAPILLARY: 99 mg/dL (ref 70–99)
Glucose-Capillary: 129 mg/dL — ABNORMAL HIGH (ref 70–99)
Glucose-Capillary: 109 mg/dL — ABNORMAL HIGH (ref 70–99)

## 2013-06-20 NOTE — Progress Notes (Signed)
Subjective/Complaints: 53 y.o. female with history of HTN, DM, bipolar disorder, who was admitted on 05/27/13 with right facial droop and aphasia. 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 2 D echo with EF 55-60%. Carotid dopplers without significant ICA stenosis. 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 Patient with progressive neurologic worsening on 05/29/13 pm with RUE >RLE weakness. Started on hypertonic fluids and transferred to ICU for closer monitoring. Follow up CCT without significant changes. Patient with decrease in LOC with worsening of symptoms  Smiling today. Remains aphasic/anarthric Review of Systems - Negative except aphasic not able to consistently do Y/N Objective: Vital Signs: Blood pressure 165/94, pulse 83, temperature 98.1 F (36.7 C), temperature source Oral, resp. rate 18, weight 73 kg (160 lb 15 oz), SpO2 99.00%. No results found. Results for orders placed during the hospital encounter of 06/04/13 (from the past 72 hour(s))  GLUCOSE, CAPILLARY     Status: Abnormal   Collection Time    06/17/13 11:49 AM      Result Value Ref Range   Glucose-Capillary 107 (*) 70 - 99 mg/dL  GLUCOSE, CAPILLARY     Status: Abnormal   Collection Time    06/17/13  4:17 PM      Result Value Ref Range   Glucose-Capillary 105 (*) 70 - 99 mg/dL  GLUCOSE, CAPILLARY     Status: Abnormal   Collection Time    06/17/13  8:00 PM      Result Value Ref Range   Glucose-Capillary 135 (*) 70 - 99 mg/dL   Comment 1 Notify RN    GLUCOSE, CAPILLARY     Status: Abnormal   Collection Time    06/17/13 11:40 PM      Result  Value Ref Range   Glucose-Capillary 142 (*) 70 - 99 mg/dL   Comment 1 Notify RN    GLUCOSE, CAPILLARY     Status: None   Collection Time    06/18/13  3:43 AM      Result Value Ref Range   Glucose-Capillary 83  70 - 99 mg/dL   Comment 1 Notify RN    GLUCOSE, CAPILLARY     Status: Abnormal   Collection Time    06/18/13  8:01 AM      Result Value Ref Range   Glucose-Capillary 108 (*) 70 - 99 mg/dL   Comment 1 Notify RN    GLUCOSE, CAPILLARY     Status: Abnormal   Collection Time    06/18/13 12:02 PM      Result Value Ref Range   Glucose-Capillary 203 (*) 70 - 99 mg/dL   Comment 1 Notify RN    GLUCOSE, CAPILLARY     Status: None   Collection Time    06/18/13  4:53 PM      Result Value Ref Range   Glucose-Capillary 96  70 - 99 mg/dL   Comment 1 Notify RN    GLUCOSE, CAPILLARY     Status: Abnormal   Collection Time    06/18/13  8:51 PM      Result Value Ref Range   Glucose-Capillary 118 (*) 70 - 99 mg/dL   Comment 1 Notify  RN    GLUCOSE, CAPILLARY     Status: None   Collection Time    06/19/13  7:22 AM      Result Value Ref Range   Glucose-Capillary 75  70 - 99 mg/dL   Comment 1 Notify RN    GLUCOSE, CAPILLARY     Status: None   Collection Time    06/19/13  4:30 PM      Result Value Ref Range   Glucose-Capillary 91  70 - 99 mg/dL   Comment 1 Notify RN    GLUCOSE, CAPILLARY     Status: Abnormal   Collection Time    06/19/13  8:50 PM      Result Value Ref Range   Glucose-Capillary 130 (*) 70 - 99 mg/dL  GLUCOSE, CAPILLARY     Status: Abnormal   Collection Time    06/20/13  7:35 AM      Result Value Ref Range   Glucose-Capillary 118 (*) 70 - 99 mg/dL   Comment 1 Notify RN       HEENT: normal   Cardio: RRR and no murmur Resp: CTA B/L and unlabored GI: BS positive and non distended, G tube intact. Bumper ok. Generally tender at ostomy No abnl drainage Extremity:  Pulses positive and No Edema Skin:   Intact Neuro: alert ,smiling, Cranial Nerve Abnormalities R  central 7, Abnormal Sensory  Abnormal Motor 0/5 RUE and RLE except 2- knee ext, normal on Left and Aphasic. 1- 1+ tone RE including bicep,wrist/fingers, hamstring/gastroc  MAS 2-3 R FF, Thumb flexors, WF Musc/Skel:  Other no pain with R shoulder ROM Gen NAD. Very alert   Assessment/Plan: 1. Functional deficits secondary to Left MCA infarct which require 3+ hours per day of interdisciplinary therapy in a comprehensive inpatient rehab setting. Physiatrist is providing close team supervision and 24 hour management of active medical problems listed below. Physiatrist and rehab team continue to assess barriers to discharge/monitor patient progress toward functional and medical goals.  FIM: FIM - Bathing Bathing Steps Patient Completed: Chest;Abdomen;Right upper leg;Left upper leg;Right Arm;Front perineal area;Buttocks;Right lower leg (including foot);Left lower leg (including foot) Bathing: 4: Min-Patient completes 8-9 66f 10 parts or 75+ percent  FIM - Upper Body Dressing/Undressing Upper body dressing/undressing steps patient completed: Thread/unthread left sleeve of pullover shirt/dress;Put head through opening of pull over shirt/dress;Pull shirt over trunk Upper body dressing/undressing: 4: Min-Patient completed 75 plus % of tasks FIM - Lower Body Dressing/Undressing Lower body dressing/undressing steps patient completed: Thread/unthread right underwear leg;Thread/unthread left underwear leg;Pull underwear up/down;Thread/unthread right pants leg;Thread/unthread left pants leg Lower body dressing/undressing: 3: Mod-Patient completed 50-74% of tasks  FIM - Toileting Toileting: 0: No continent bowel/bladder events this shift  FIM - Radio producer Devices: Recruitment consultant Transfers: 3-To toilet/BSC: Mod A (lift or lower assist);3-From toilet/BSC: Mod A (lift or lower assist)  FIM - Bed/Chair Transfer Bed/Chair Transfer Assistive Devices: Arm rests;Bed  rails Bed/Chair Transfer: 4: Supine > Sit: Min A (steadying Pt. > 75%/lift 1 leg);4: Sit > Supine: Min A (steadying pt. > 75%/lift 1 leg);4: Bed > Chair or W/C: Min A (steadying Pt. > 75%);4: Chair or W/C > Bed: Min A (steadying Pt. > 75%)  FIM - Locomotion: Wheelchair Distance: 150 Locomotion: Wheelchair: 3: Travels 150 ft or more: maneuvers on rugs and over door sills with moderate assistance  (Pt: 50 - 74%) FIM - Locomotion: Ambulation Locomotion: Ambulation Assistive Devices: Other (comment) (L rail, ace wrap for DF assist on R  LE) Ambulation/Gait Assistance: 3: Mod assist Locomotion: Ambulation: 1: Travels less than 50 ft with maximal assistance (Pt: 25 - 49%)  Comprehension Comprehension Mode: Auditory Comprehension: 2-Understands basic 25 - 49% of the time/requires cueing 51 - 75% of the time  Expression Expression Mode: Verbal Expression: 1-Expresses basis less than 25% of the time/requires cueing greater than 75% of the time.  Social Interaction Social Interaction: 2-Interacts appropriately 25 - 49% of time - Needs frequent redirection.  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 DVT Prophylaxis/Anticoagulation: Pharmaceutical: Lovenox  2. Pain Management: N/A  3. Bipolar disorder/Mood: No signs of distress reported but patient with poor awareness of deficits. Monitor mood for now. LCSW to follow with patient and family.  4. Neuropsych: This patient is not capable of making decisions on her own behalf.  5. DM type 2: Will monitor with bid checks. Continue to titrate insulin as needed---will change to coordinate with bolus feeding schedule. Use SSI for elevated  BS.  6. HTN: adjust for better control avoid hypoperfusion/neuro worsening   titrating  labetalol carefully 7. H/o paranoid schizophrenia: Has been to [facility] in the past due to decompensation. Not on medications as this time--has been on respiridone in the past and has history of non-compliance per records  8. Dysphagia: s/p G-tube   -on bolus feeds   9. Resting Tachycardia:     On water flushes to avoid dehydration.   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

## 2013-06-20 NOTE — Progress Notes (Signed)
Occupational Therapy Session Note  Patient Details  Name: TORIANA SPONSEL MRN: [id number] Date of Birth: [birth year]  Today's Date: 06/20/2013 Time: 0905-1000 Time Calculation (min): 55 min  Short Term Goals: Week 3:  OT Short Term Goal 1 (Week 3): Pt will position RUE prior to transfers with min verbal cues OT Short Term Goal 2 (Week 3): Pt will complete LB dressing with min assist OT Short Term Goal 3 (Week 3): Pt will complete toilet transfer with min assist OT Short Term Goal 4 (Week 3): Pt will complete 3/3 toileting steps with mod verbal cues   Skilled Therapeutic Interventions/Progress Updates:    Engaged in ADL retraining with focus on sit > stand, standing balance, RUE attention, and increased participation in self-care tasks. Bathing and dressing completed at sit > stand level at sink with min assist and occasional blocking at Rt knee due to RLE weakness at Rt knee buckling in standing. Pt with increased initiation with bathing, mod verbal and tactile cues for dressing tasks with pt able to don brief and pants this session. Pt with carryover of RUE placement on sink during bathing and when standing with hand over hand to maintain position.  Pt more engaged in session with increased facial expressions.    Therapy Documentation Precautions:  Precautions Precautions: Fall Precaution Comments: right hemiperesis, NPO and maintain HOB >30 deg; MONITOR VITALS; Alert RN for BP >180/>105 and hold activity for BP 220/120, monitor for signs of aspiration or abdominal pain and distention Restrictions Weight Bearing Restrictions: No General:   Vital Signs: Therapy Vitals Pulse Rate: 79 BP: 168/94 mmHg Patient Position, if appropriate: Sitting Pain: Pain Assessment Pain Assessment: No/denies pain Pain Score: 0-No pain  See FIM for current functional status  Therapy/Group: Individual Therapy  Simonne Come 06/20/2013, 10:04 AM

## 2013-06-20 NOTE — Progress Notes (Signed)
Physical Therapy Note  Patient Details  Name: [name] MRN: [id number] Date of Birth: 05-13-1960 Today's Date: 06/20/2013  Time: 1310-1340 30 minutes  1:1 No c/o pain.  Pt given 2 choices in writing of what activity to work on, pt chooses "walking".  Pt performed gait with RW with R DF ace wrap and R UE ace wrapped to RW.  Pt requires min/mod A for wt shifts, manual facilitation for R hip flexion during swing, max facilitation for R anterior hip translation in stance.  Pt with noted R hip ER which worsens with fatigue.  Pt with more difficulty controlling RW when fatigued and during turns, needs min A.  Pt smiles at end of session, appears pleased with progress.   Serine Kea 06/20/2013, 1:42 PM

## 2013-06-20 NOTE — Progress Notes (Signed)
Speech Language Pathology Daily Session Note  Patient Details  Name: [name] MRN: [id number] Date of Birth: [birth year]  Today's Date: 06/20/2013 Time: 1015-1100 Time Calculation (min): 45 min  Short Term Goals: Week 2: SLP Short Term Goal 1 (Week 2): Patient will initiiate 20 swallows per session with contextual and Max multi-modal clinician cues. SLP Short Term Goal 1 - Progress (Week 2): Progressing toward goal SLP Short Term Goal 2 (Week 2): Patient will follow 1-step directions with contextual and Max multi-modal clinician cues. SLP Short Term Goal 3 (Week 2): Patient will identify object from a field of 2 with Mod multi-modal cues. SLP Short Term Goal 4 (Week 2): Patient will approximate speech sounds during automatic speech tasks with Max assist multi-modal cues. SLP Short Term Goal 4 - Progress (Week 2): Progressing toward goal  Skilled Therapeutic Interventions: Session focused on language and dysphagia:  Pt with spontaneous vocalization (laughter) intermittently throughout session - during one occasion she was able to continue phonation with cues to sustain voice. During automatic, over-learned verbal sequences, pt attempts articulatory placement, but no voicing elicited.  Yes /no reliability for personal questions was 60% accurate.  Max assist for object discrimination with choice of two.  Pt able to copy simple figures; mod assist to cease perseveratory pattern.  Traced and then copied first and last name with mod assist.  Pt able to transition among four letters before perseveration began.  Self-fed ice chips with enthusiasm and in presence of close supervision.  Pt with no need for oral suctioning today; improved secretion management noted.  Excellent initiation of swallowing sequence noted with no overt s/s of aspiration.       FIM:  Comprehension Comprehension Mode: Auditory Comprehension: 2-Understands basic 25 - 49% of the time/requires cueing 51 - 75% of the  time Expression Expression Mode: Verbal Expression: 1-Expresses basis less than 25% of the time/requires cueing greater than 75% of the time. Social Interaction Social Interaction: 2-Interacts appropriately 25 - 49% of time - Needs frequent redirection. Problem Solving Problem Solving: 2-Solves basic 25 - 49% of the time - needs direction more than half the time to initiate, plan or complete simple activities Memory Memory: 2-Recognizes or recalls 25 - 49% of the time/requires cueing 51 - 75% of the time  Pain Pain Assessment Pain Assessment: No/denies pain Pain Score: 0-No pain  Therapy/Group: Individual Therapy  Juan Quam Laurice 06/20/2013, 11:29 AM

## 2013-06-20 NOTE — Progress Notes (Signed)
Physical Therapy Note  Patient Details  Name: [name] MRN: [id number] Date of Birth: [birth year] Today's Date: 06/20/2013  Time: 1100-1155 55 minutes  1:1 No signs/symptoms of pain.  Gait training at railing 2 x 25' with min manual facilitation for wt shifts, pt better able to advance R LE today.  R ankle in DF ace wrap.  Pt with improved upright posture during gait, less forward lean at trunk.  Attempted gait with RW with R UE ace wrapped to handle.  Pt able to gait 2 x 25' with RW with min/mod A.  Pt with good control of RW, improved upright posture, manual facilitation for wt shifts, continues to require max cuing and facilitation for anterior R hip translation during stance, pt resists this motion ? Due to fear of falling.  NMR with focus on R LE quad activation with AAROM with tapping, improves with repetition over > 20 reps.   [name] 06/20/2013, 11:51 AM

## 2013-06-21 ENCOUNTER — Inpatient Hospital Stay (HOSPITAL_COMMUNITY): Payer: Medicaid Other | Admitting: Speech Pathology

## 2013-06-21 LAB — GLUCOSE, CAPILLARY
GLUCOSE-CAPILLARY: 128 mg/dL — AB (ref 70–99)
GLUCOSE-CAPILLARY: 150 mg/dL — AB (ref 70–99)
GLUCOSE-CAPILLARY: 86 mg/dL (ref 70–99)
Glucose-Capillary: 111 mg/dL — ABNORMAL HIGH (ref 70–99)

## 2013-06-21 MED ORDER — LABETALOL HCL 300 MG PO TABS
300.0000 mg | ORAL_TABLET | Freq: Two times a day (BID) | ORAL | Status: DC
  Administered 2013-06-21 (×2): 300 mg via NASOGASTRIC
  Filled 2013-06-21 (×5): qty 1

## 2013-06-21 NOTE — Progress Notes (Signed)
Subjective/Complaints: 53 y.o. female with history of HTN, DM, bipolar disorder, who was admitted on 05/27/13 with right facial droop and aphasia. 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 2 D echo with EF 55-60%. Carotid dopplers without significant ICA stenosis. 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 Patient with progressive neurologic worsening on 05/29/13 pm with RUE >RLE weakness. Started on hypertonic fluids and transferred to ICU for closer monitoring. Follow up CCT without significant changes. Patient with decrease in LOC with worsening of symptoms  No problems per nsg staff  Remains aphasic/anarthric Review of Systems - Negative except aphasic not able to consistently do Y/N Objective: Vital Signs: Blood pressure 169/97, pulse 92, temperature 98.2 F (36.8 C), temperature source Oral, resp. rate 19, weight 73.3 kg (161 lb 9.6 oz), SpO2 99.00%. No results found. Results for orders placed during the hospital encounter of 06/04/13 (from the past 72 hour(s))  GLUCOSE, CAPILLARY     Status: Abnormal   Collection Time    06/18/13  8:01 AM      Result Value Ref Range   Glucose-Capillary 108 (*) 70 - 99 mg/dL   Comment 1 Notify RN    GLUCOSE, CAPILLARY     Status: Abnormal   Collection Time    06/18/13 12:02 PM      Result Value Ref Range   Glucose-Capillary 203 (*) 70 - 99 mg/dL   Comment 1 Notify RN    GLUCOSE, CAPILLARY     Status: None   Collection Time    06/18/13  4:53 PM      Result Value Ref Range   Glucose-Capillary 96  70 - 99 mg/dL   Comment 1 Notify RN    GLUCOSE, CAPILLARY     Status: Abnormal    Collection Time    06/18/13  8:51 PM      Result Value Ref Range   Glucose-Capillary 118 (*) 70 - 99 mg/dL   Comment 1 Notify RN    GLUCOSE, CAPILLARY     Status: None   Collection Time    06/19/13  7:22 AM      Result Value Ref Range   Glucose-Capillary 75  70 - 99 mg/dL   Comment 1 Notify RN    GLUCOSE, CAPILLARY     Status: None   Collection Time    06/19/13  4:30 PM      Result Value Ref Range   Glucose-Capillary 91  70 - 99 mg/dL   Comment 1 Notify RN    GLUCOSE, CAPILLARY     Status: Abnormal   Collection Time    06/19/13  8:50 PM      Result Value Ref Range   Glucose-Capillary 130 (*) 70 - 99 mg/dL  GLUCOSE, CAPILLARY     Status: Abnormal   Collection Time    06/20/13  7:35 AM      Result Value Ref Range   Glucose-Capillary 118 (*) 70 - 99 mg/dL   Comment 1 Notify RN    GLUCOSE, CAPILLARY     Status: Abnormal   Collection Time    06/20/13 12:12 PM      Result Value Ref Range  Glucose-Capillary 129 (*) 70 - 99 mg/dL   Comment 1 Notify RN    GLUCOSE, CAPILLARY     Status: Abnormal   Collection Time    06/20/13  4:49 PM      Result Value Ref Range   Glucose-Capillary 109 (*) 70 - 99 mg/dL  GLUCOSE, CAPILLARY     Status: None   Collection Time    06/20/13  8:58 PM      Result Value Ref Range   Glucose-Capillary 99  70 - 99 mg/dL   Comment 1 Notify RN    GLUCOSE, CAPILLARY     Status: Abnormal   Collection Time    06/21/13  7:26 AM      Result Value Ref Range   Glucose-Capillary 128 (*) 70 - 99 mg/dL     HEENT: normal   Cardio: RRR and no murmur Resp: CTA B/L and unlabored GI: BS positive and non distended, G tube intact. Bumper ok. Generally tender at ostomy No abnl drainage Extremity:  Pulses positive and No Edema Skin:   Intact Neuro: alert ,smiling, Cranial Nerve Abnormalities R central 7, Abnormal Sensory  Abnormal Motor 0/5 RUE and RLE except 2- knee ext, normal on Left and Aphasic. 1- 1+ tone RE including bicep,wrist/fingers, hamstring/gastroc   MAS 2-3 R FF, Thumb flexors, WF Musc/Skel:  Other no pain with R shoulder ROM Gen NAD. Very alert   Assessment/Plan: 1. Functional deficits secondary to Left MCA infarct which require 3+ hours per day of interdisciplinary therapy in a comprehensive inpatient rehab setting. Physiatrist is providing close team supervision and 24 hour management of active medical problems listed below. Physiatrist and rehab team continue to assess barriers to discharge/monitor patient progress toward functional and medical goals.  FIM: FIM - Bathing Bathing Steps Patient Completed: Chest;Abdomen;Right upper leg;Left upper leg;Right Arm;Front perineal area;Buttocks Bathing: 3: Mod-Patient completes 5-7 73f 10 parts or 50-74%  FIM - Upper Body Dressing/Undressing Upper body dressing/undressing steps patient completed: Thread/unthread left sleeve of pullover shirt/dress;Put head through opening of pull over shirt/dress;Pull shirt over trunk Upper body dressing/undressing: 4: Min-Patient completed 75 plus % of tasks FIM - Lower Body Dressing/Undressing Lower body dressing/undressing steps patient completed: Thread/unthread left underwear leg;Pull underwear up/down;Thread/unthread left pants leg Lower body dressing/undressing: 2: Max-Patient completed 25-49% of tasks  FIM - Toileting Toileting: 0: No continent bowel/bladder events this shift  FIM - Radio producer Devices: Recruitment consultant Transfers: 3-To toilet/BSC: Mod A (lift or lower assist);3-From toilet/BSC: Mod A (lift or lower assist)  FIM - Bed/Chair Transfer Bed/Chair Transfer Assistive Devices: Arm rests;Bed rails Bed/Chair Transfer: 4: Supine > Sit: Min A (steadying Pt. > 75%/lift 1 leg);4: Bed > Chair or W/C: Min A (steadying Pt. > 75%)  FIM - Locomotion: Wheelchair Distance: 150 Locomotion: Wheelchair: 3: Travels 150 ft or more: maneuvers on rugs and over door sills with moderate assistance  (Pt: 50 -  74%) FIM - Locomotion: Ambulation Locomotion: Ambulation Assistive Devices: Other (comment) (L rail, ace wrap for DF assist on R LE) Ambulation/Gait Assistance: 3: Mod assist Locomotion: Ambulation: 1: Travels less than 50 ft with maximal assistance (Pt: 25 - 49%)  Comprehension Comprehension Mode: Auditory Comprehension: 2-Understands basic 25 - 49% of the time/requires cueing 51 - 75% of the time  Expression Expression Mode: Verbal Expression: 1-Expresses basis less than 25% of the time/requires cueing greater than 75% of the time.  Social Interaction Social Interaction: 2-Interacts appropriately 25 - 49% of time - Needs frequent redirection.  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 DVT Prophylaxis/Anticoagulation: Pharmaceutical: Lovenox  2. Pain Management: N/A  3. Bipolar disorder/Mood: No signs of distress reported but patient with poor awareness of deficits. Monitor mood for now. LCSW to follow with patient and family.  4. Neuropsych: This patient is not capable of making decisions on her own behalf.  5. DM type 2: Will monitor with bid checks. Continue to titrate insulin as needed---will change to coordinate with bolus feeding schedule. Use SSI for elevated BS.  6. HTN: adjust for better control avoid hypoperfusion/neuro worsening   titrating  labetalol may add ACE-I  7. H/o paranoid schizophrenia: Has been to [facility] in the past due to decompensation. Not on medications as this time--has been on respiridone in the past and has history of non-compliance per records  8. Dysphagia: s/p G-tube   -on bolus feeds   9. Resting Tachycardia:     On water flushes to  avoid dehydration.   - titrateBB  , improving/trending to tolerable levels 10. Hypertonicity: add scheduled baclofen low dose  -right WHO, increasing elbow flexor tone   LOS (Days) 17 A FACE TO FACE EVALUATION WAS PERFORMED  [name] 06/21/2013, 7:54 AM

## 2013-06-21 NOTE — Progress Notes (Signed)
Speech Language Pathology Daily Session Note  Patient Details  Name: [name] MRN: [id number] Date of Birth: 10-01-1960  Today's Date: 06/21/2013 Time: 1200-1230 Time Calculation (min): 30 min  Short Term Goals: Week 1: SLP Short Term Goal 1 (Week 1): Patient will initiiate 5 swallows per session with Max multi-modal cues. SLP Short Term Goal 1 - Progress (Week 1): Met SLP Short Term Goal 2 (Week 1): Patient will follow 1-step directions with Max multi-modal cues. SLP Short Term Goal 2 - Progress (Week 1): Progressing toward goal SLP Short Term Goal 3 (Week 1): Patient will identify object from a field of 2 with Max mlulti-modal cues. SLP Short Term Goal 3 - Progress (Week 1): Met SLP Short Term Goal 4 (Week 1): Patient will approximate speech sounds with Max assist multi-modal cues. SLP Short Term Goal 4 - Progress (Week 1): Progressing toward goal  Skilled Therapeutic Interventions: Skilled session focused on dysphagia goals and language goals. Patient's daughter present during session and SLP provided her with a questionaire complete for use in therapies, with questions related to patient's biographical and family history. SLP provided ice chip trials via teaspoon following oral care. Patient exhibted timely swallows with a total of 20-25 swallows during course of tx session. No overt s/s aspiration noted and recommend that now that patient is consistently initiating swalllow, MBSS would be beneficial to determine swallow function.  Patient also seen for language goals. Yes/no responses for object idenfication (ie, is this a fork?) was 60-65% accurate with impact of perseveration on yes response, however some improvement noted with repetition of task. Patient correctly selected object with name cue (fork) with two field choices of objects, with accuracy  at 75%. Patient initiated vocalizations (/m/, and "mm-hmm") spontaneously but unable to when cued.    FIM:  Expression Expression  Mode: Verbal Expression: 1-Expresses basis less than 25% of the time/requires cueing greater than 75% of the time. Social Interaction Social Interaction: 2-Interacts appropriately 25 - 49% of time - Needs frequent redirection. Problem Solving Problem Solving: 2-Solves basic 25 - 49% of the time - needs direction more than half the time to initiate, plan or complete simple activities Memory Memory: 2-Recognizes or recalls 25 - 49% of the time/requires cueing 51 - 75% of the time  Pain Pain Assessment Pain Assessment: No/denies pain Pain Score: 0-No pain  Therapy/Group: Individual Therapy  Dannial Monarch 06/21/2013, 1:39 PM  Sonia Baller, MA, CCC-SLP [facility] Speech-Language Pathologist

## 2013-06-22 ENCOUNTER — Encounter (HOSPITAL_COMMUNITY): Payer: Self-pay

## 2013-06-22 LAB — GLUCOSE, CAPILLARY
GLUCOSE-CAPILLARY: 118 mg/dL — AB (ref 70–99)
Glucose-Capillary: 129 mg/dL — ABNORMAL HIGH (ref 70–99)
Glucose-Capillary: 115 mg/dL — ABNORMAL HIGH (ref 70–99)
Glucose-Capillary: 143 mg/dL — ABNORMAL HIGH (ref 70–99)

## 2013-06-22 MED ORDER — LABETALOL HCL 300 MG PO TABS
300.0000 mg | ORAL_TABLET | Freq: Two times a day (BID) | ORAL | Status: AC
  Administered 2013-06-22 (×2): 300 mg
  Filled 2013-06-22: qty 1

## 2013-06-22 MED ORDER — LABETALOL HCL 300 MG PO TABS
300.0000 mg | ORAL_TABLET | Freq: Three times a day (TID) | ORAL | Status: DC
  Administered 2013-06-23 – 2013-07-01 (×25): 300 mg via NASOGASTRIC
  Filled 2013-06-22 (×27): qty 1

## 2013-06-22 NOTE — Plan of Care (Signed)
Problem: RH KNOWLEDGE DEFICIT Goal: RH STG INCREASE KNOWLEDGE OF HYPERTENSION Mod Assist ; family and caregiver will be able to explain interventions for management of HTN, low salt low fat diet and medications administered for BP control  Outcome: Not Progressing Need to educate family [name]

## 2013-06-22 NOTE — Progress Notes (Signed)
Subjective/Complaints: 53 y.o. female with history of HTN, DM, bipolar disorder, who was admitted on 05/27/13 with right facial droop and aphasia. 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 2 D echo with EF 55-60%. Carotid dopplers without significant ICA stenosis. 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 Patient with progressive neurologic worsening on 05/29/13 pm with RUE >RLE weakness. Started on hypertonic fluids and transferred to ICU for closer monitoring. Follow up CCT without significant changes. Patient with decrease in LOC with worsening of symptoms  Pt awake, smiling this am Remains aphasic/anarthric Review of Systems - Negative except aphasic not able to consistently do Y/N Objective: Vital Signs: Blood pressure 169/93, pulse 96, temperature 98.5 F (36.9 C), temperature source Oral, resp. rate 19, weight 72.9 kg (160 lb 11.5 oz), SpO2 100.00%. No results found. Results for orders placed during the hospital encounter of 06/04/13 (from the past 72 hour(s))  GLUCOSE, CAPILLARY     Status: None   Collection Time    06/19/13  7:22 AM      Result Value Ref Range   Glucose-Capillary 75  70 - 99 mg/dL   Comment 1 Notify RN    GLUCOSE, CAPILLARY     Status: None   Collection Time    06/19/13  4:30 PM      Result Value Ref Range   Glucose-Capillary 91  70 - 99 mg/dL   Comment 1 Notify RN    GLUCOSE, CAPILLARY     Status: Abnormal   Collection Time    06/19/13  8:50 PM      Result Value Ref Range   Glucose-Capillary 130 (*) 70 - 99 mg/dL  GLUCOSE, CAPILLARY     Status: Abnormal   Collection Time    06/20/13   7:35 AM      Result Value Ref Range   Glucose-Capillary 118 (*) 70 - 99 mg/dL   Comment 1 Notify RN    GLUCOSE, CAPILLARY     Status: Abnormal   Collection Time    06/20/13 12:12 PM      Result Value Ref Range   Glucose-Capillary 129 (*) 70 - 99 mg/dL   Comment 1 Notify RN    GLUCOSE, CAPILLARY     Status: Abnormal   Collection Time    06/20/13  4:49 PM      Result Value Ref Range   Glucose-Capillary 109 (*) 70 - 99 mg/dL  GLUCOSE, CAPILLARY     Status: None   Collection Time    06/20/13  8:58 PM      Result Value Ref Range   Glucose-Capillary 99  70 - 99 mg/dL   Comment 1 Notify RN    GLUCOSE, CAPILLARY     Status: Abnormal   Collection Time    06/21/13  7:26 AM      Result Value Ref Range   Glucose-Capillary 128 (*) 70 - 99 mg/dL  GLUCOSE, CAPILLARY     Status: Abnormal   Collection Time    06/21/13 11:28 AM      Result Value Ref Range   Glucose-Capillary 150 (*) 70 - 99 mg/dL  GLUCOSE, CAPILLARY     Status: None  Collection Time    06/21/13  4:42 PM      Result Value Ref Range   Glucose-Capillary 86  70 - 99 mg/dL   Comment 1 Notify RN    GLUCOSE, CAPILLARY     Status: Abnormal   Collection Time    06/21/13  9:20 PM      Result Value Ref Range   Glucose-Capillary 111 (*) 70 - 99 mg/dL   Comment 1 Notify RN       HEENT: normal   Cardio: RRR and no murmur Resp: CTA B/L and unlabored GI: BS positive and non distended, G tube intact. Bumper ok. Generally tender at ostomy No abnl drainage Extremity:  Pulses positive and No Edema Skin:   Intact Neuro: alert ,smiling, Cranial Nerve Abnormalities R central 7, Abnormal Sensory  Abnormal Motor 0/5 RUE and RLE except 2- knee ext, normal on Left and Aphasic. 1- 1+ tone RE including bicep,wrist/fingers, hamstring/gastroc  MAS 2-3 R FF, Thumb flexors, WF Musc/Skel:  Other no pain with R shoulder ROM Gen NAD. Very alert   Assessment/Plan: 1. Functional deficits secondary to Left MCA infarct which require 3+ hours per  day of interdisciplinary therapy in a comprehensive inpatient rehab setting. Physiatrist is providing close team supervision and 24 hour management of active medical problems listed below. Physiatrist and rehab team continue to assess barriers to discharge/monitor patient progress toward functional and medical goals.  FIM: FIM - Bathing Bathing Steps Patient Completed: Chest;Abdomen;Right upper leg;Left upper leg;Right Arm;Front perineal area;Buttocks Bathing: 3: Mod-Patient completes 5-7 30f 10 parts or 50-74%  FIM - Upper Body Dressing/Undressing Upper body dressing/undressing steps patient completed: Thread/unthread left sleeve of pullover shirt/dress;Put head through opening of pull over shirt/dress;Pull shirt over trunk Upper body dressing/undressing: 4: Min-Patient completed 75 plus % of tasks FIM - Lower Body Dressing/Undressing Lower body dressing/undressing steps patient completed: Thread/unthread left underwear leg;Pull underwear up/down;Thread/unthread left pants leg Lower body dressing/undressing: 2: Max-Patient completed 25-49% of tasks  FIM - Toileting Toileting: 0: No continent bowel/bladder events this shift  FIM - Radio producer Devices: Recruitment consultant Transfers: 3-To toilet/BSC: Mod A (lift or lower assist);3-From toilet/BSC: Mod A (lift or lower assist)  FIM - Bed/Chair Transfer Bed/Chair Transfer Assistive Devices: Arm rests;Bed rails Bed/Chair Transfer: 4: Supine > Sit: Min A (steadying Pt. > 75%/lift 1 leg);4: Bed > Chair or W/C: Min A (steadying Pt. > 75%)  FIM - Locomotion: Wheelchair Distance: 150 Locomotion: Wheelchair: 3: Travels 150 ft or more: maneuvers on rugs and over door sills with moderate assistance  (Pt: 50 - 74%) FIM - Locomotion: Ambulation Locomotion: Ambulation Assistive Devices: Other (comment) (L rail, ace wrap for DF assist on R LE) Ambulation/Gait Assistance: 3: Mod assist Locomotion: Ambulation: 1:  Travels less than 50 ft with maximal assistance (Pt: 25 - 49%)  Comprehension Comprehension Mode: Auditory Comprehension: 2-Understands basic 25 - 49% of the time/requires cueing 51 - 75% of the time  Expression Expression Mode: Verbal Expression: 1-Expresses basis less than 25% of the time/requires cueing greater than 75% of the time.  Social Interaction Social Interaction: 2-Interacts appropriately 25 - 49% of time - Needs frequent redirection.  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 DVT Prophylaxis/Anticoagulation: Pharmaceutical: Lovenox  2. Pain Management: N/A  3. Bipolar disorder/Mood: No signs of distress reported but patient with poor awareness of deficits. Monitor mood for now. LCSW to follow with patient and family.  4. Neuropsych: This patient is not capable of making decisions on her own behalf.  5. DM type 2: Will monitor with bid checks. Continue to titrate insulin as needed---will change to coordinate with bolus feeding schedule. Use SSI for elevated BS.  6. HTN: adjust for better control avoid hypoperfusion/neuro worsening   titrating  labetalol may add ACE-I  7. H/o paranoid schizophrenia: Has been to [facility] in the past due to decompensation. Not on medications as this time--has been on respiridone in the past and has history of non-compliance per records  8. Dysphagia: s/p G-tube   -on bolus feeds   9. Resting Tachycardia:     On water flushes to avoid dehydration.   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

## 2013-06-22 NOTE — Plan of Care (Signed)
Problem: RH BOWEL ELIMINATION Goal: RH STG MANAGE BOWEL WITH ASSISTANCE STG Manage Bowel with Min Assistance.  Outcome: Not Progressing  incontinent

## 2013-06-22 NOTE — Plan of Care (Signed)
Problem: RH BLADDER ELIMINATION Goal: RH STG MANAGE BLADDER WITH ASSISTANCE STG Manage Bladder With Min Assistance   Outcome: Not Progressing incontinent   

## 2013-06-22 NOTE — Progress Notes (Signed)
Occupational Therapy Session Note  Patient Details  Name: [name] MRN: [id number] Date of Birth: 07/10/1960  Today's Date: 06/22/2013  Session 1 Time: 1000-1057 Time Calculation (min): 57 min  Short Term Goals: Week 3:  OT Short Term Goal 1 (Week 3): Pt will position RUE prior to transfers with min verbal cues OT Short Term Goal 2 (Week 3): Pt will complete LB dressing with min assist OT Short Term Goal 3 (Week 3): Pt will complete toilet transfer with min assist OT Short Term Goal 4 (Week 3): Pt will complete 3/3 toileting steps with mod verbal cues   Skilled Therapeutic Interventions/Progress Updates:    Pt engaged in ADL retraining including bathing and dressing with sit<>stand from w/c at sink.  Pt required max multimodal cues to initiate tasks and perseverated on washing face. Pt performed sit<>stand with min A but required assistance with maintaining Rt knee in correct position.  Pt required tot A to thread right pants leg but was able to thread left pants leg and pul pants up while therapist provided steady A.  Focus on transfers, task initiation, attention to task, sit<>stand, standing balance, activity tolerance, and safety awareness. Pt denied pain throughout session.  Therapy Documentation Precautions:  Precautions Precautions: Fall Precaution Comments: right hemiperesis, NPO and maintain HOB >30 deg; MONITOR VITALS; Alert RN for BP >180/>105 and hold activity for BP 220/120, monitor for signs of aspiration or abdominal pain and distention Restrictions Weight Bearing Restrictions: No See FIM for current functional status  Therapy/Group: Individual Therapy  Leroy Libman 06/22/2013, 10:59 AM

## 2013-06-23 ENCOUNTER — Encounter (HOSPITAL_COMMUNITY): Payer: Self-pay | Admitting: Occupational Therapy

## 2013-06-23 ENCOUNTER — Inpatient Hospital Stay (HOSPITAL_COMMUNITY): Payer: Self-pay | Admitting: Rehabilitation

## 2013-06-23 ENCOUNTER — Inpatient Hospital Stay (HOSPITAL_COMMUNITY): Payer: Medicaid Other | Admitting: Speech Pathology

## 2013-06-23 ENCOUNTER — Inpatient Hospital Stay (HOSPITAL_COMMUNITY): Payer: Self-pay | Admitting: Occupational Therapy

## 2013-06-23 DIAGNOSIS — I6340 Cerebral infarction due to embolism of unspecified cerebral artery: Secondary | ICD-10-CM

## 2013-06-23 DIAGNOSIS — E119 Type 2 diabetes mellitus without complications: Secondary | ICD-10-CM

## 2013-06-23 DIAGNOSIS — I10 Essential (primary) hypertension: Secondary | ICD-10-CM

## 2013-06-23 DIAGNOSIS — F319 Bipolar disorder, unspecified: Secondary | ICD-10-CM

## 2013-06-23 LAB — GLUCOSE, CAPILLARY
GLUCOSE-CAPILLARY: 114 mg/dL — AB (ref 70–99)
GLUCOSE-CAPILLARY: 181 mg/dL — AB (ref 70–99)
Glucose-Capillary: 112 mg/dL — ABNORMAL HIGH (ref 70–99)
Glucose-Capillary: 138 mg/dL — ABNORMAL HIGH (ref 70–99)

## 2013-06-23 NOTE — Progress Notes (Signed)
Physical Therapy Session Note  Patient Details  Name: [name] MRN: [id number] Date of Birth: [birth year]  Today's Date: 06/23/2013 Time: 6812-7517 Time Calculation (min): 59 min  Short Term Goals: Week 3:  PT Short Term Goal 1 (Week 3): Pt will perform bed mobility to L and R with min A and 25% cues for initiation and sequencing PT Short Term Goal 2 (Week 3): Pt will perform bed < w/c transfers with min A and 25% cues for initiation and sequencing PT Short Term Goal 3 (Week 3): Pt will perform w/c mobility x 150' on unit with L hemi technique with SBA and 25% cues for sequencing PT Short Term Goal 4 (Week 3): Pt will perform gait x 25' with LRAD and mod A PT Short Term Goal 5 (Week 3): Pt will negotiate 3 stairs with one rail and max A and 50% cues for sequencing  Skilled Therapeutic Interventions/Progress Updates:   Pt received sitting in w/c in room, agreeable to therapy.  Assisted pt down to therapy gym and began with seated kinetron activity to work on [employer] for RLE with hip extension.  Pt requires min assist initially, however pt able to continue with min to mod cues for attending to task.  Also provided pt with verbal and visual cues for maintaining R LE add, as it tends to remain abd during task.  Transitioned to working on seated (on mat with back unsupported) hip add with ball squeeze x 15 reps.  Note pt very weak with this motion and L tends to override R.  Worked on standing activity with stepping LLE up/down to 4" step with LUE supported on rail.  Note that even in standing, she tends to keep weight over on the L until just about to step.  Provided min to mod facilitation and tapping to R quad for increased knee extension prior to stepping and for increased weight shift to the L.  Progressed to gait training in hallway with RW and R ace wrap for DF assist and around knee to prevent R knee recurvatum.  Also note that therapist wrapped R UE to RW to ensure placement.  Pt requires min  to mod assist at times for gait, but was able to advance her LE 80% of time with tactile cues for increased R step length.  She did require assist to steer RW as this was very difficult for pt.  Ended session with tall kneeling activity while therapist supported at R axilla, elbow, and wrist to encourage WB during reaching activity.  Also provided tactile support at hips/buttocks for cues to activate during standing.  Provided with two propped elbow rest breaks, but tolerated well.  Assisted back into w/c and assisted back to room.  All needs in reach and RUE supported on lap tray.    Therapy Documentation Precautions:  Precautions Precautions: Fall Precaution Comments: right hemiperesis, NPO and maintain HOB >30 deg; MONITOR VITALS; Alert RN for BP >180/>105 and hold activity for BP 220/120, monitor for signs of aspiration or abdominal pain and distention Restrictions Weight Bearing Restrictions: No   Pain: Pain Assessment Pain Assessment: No/denies pain     See FIM for current functional status  Therapy/Group: Individual Therapy  Denice Bors 06/23/2013, 3:13 PM

## 2013-06-23 NOTE — Progress Notes (Signed)
Subjective/Complaints: 53 y.o. female with history of HTN, DM, bipolar disorder, who was admitted on 05/27/13 with right facial droop and aphasia. 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 2 D echo with EF 55-60%. Carotid dopplers without significant ICA stenosis. 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 Patient with progressive neurologic worsening on 05/29/13 pm with RUE >RLE weakness. Started on hypertonic fluids and transferred to ICU for closer monitoring. Follow up CCT without significant changes. Patient with decrease in LOC with worsening of symptoms  Pt awake, smiling this am Remains aphasic/anarthric Review of Systems - Negative except aphasic not able to consistently do Y/N Objective: Vital Signs: Blood pressure 161/105, pulse 90, temperature 98.1 F (36.7 C), temperature source Oral, resp. rate 18, weight 73.8 kg (162 lb 11.2 oz), SpO2 95.00%. No results found. Results for orders placed during the hospital encounter of 06/04/13 (from the past 72 hour(s))  GLUCOSE, CAPILLARY     Status: Abnormal   Collection Time    06/20/13  7:35 AM      Result Value Ref Range   Glucose-Capillary 118 (*) 70 - 99 mg/dL   Comment 1 Notify RN    GLUCOSE, CAPILLARY     Status: Abnormal   Collection Time    06/20/13 12:12 PM      Result Value Ref Range   Glucose-Capillary 129 (*) 70 - 99 mg/dL   Comment 1 Notify RN    GLUCOSE, CAPILLARY     Status: Abnormal   Collection Time    06/20/13  4:49 PM      Result Value Ref Range   Glucose-Capillary 109 (*) 70 - 99 mg/dL  GLUCOSE, CAPILLARY     Status: None   Collection Time     06/20/13  8:58 PM      Result Value Ref Range   Glucose-Capillary 99  70 - 99 mg/dL   Comment 1 Notify RN    GLUCOSE, CAPILLARY     Status: Abnormal   Collection Time    06/21/13  7:26 AM      Result Value Ref Range   Glucose-Capillary 128 (*) 70 - 99 mg/dL  GLUCOSE, CAPILLARY     Status: Abnormal   Collection Time    06/21/13 11:28 AM      Result Value Ref Range   Glucose-Capillary 150 (*) 70 - 99 mg/dL  GLUCOSE, CAPILLARY     Status: None   Collection Time    06/21/13  4:42 PM      Result Value Ref Range   Glucose-Capillary 86  70 - 99 mg/dL   Comment 1 Notify RN    GLUCOSE, CAPILLARY     Status: Abnormal   Collection Time    06/21/13  9:20 PM      Result Value Ref Range   Glucose-Capillary 111 (*) 70 - 99 mg/dL   Comment 1 Notify RN    GLUCOSE, CAPILLARY     Status: Abnormal   Collection Time    06/22/13  7:13 AM      Result Value Ref Range   Glucose-Capillary 129 (*) 70 - 99 mg/dL   Comment 1 Notify RN  GLUCOSE, CAPILLARY     Status: Abnormal   Collection Time    06/22/13 11:44 AM      Result Value Ref Range   Glucose-Capillary 115 (*) 70 - 99 mg/dL  GLUCOSE, CAPILLARY     Status: Abnormal   Collection Time    06/22/13  4:56 PM      Result Value Ref Range   Glucose-Capillary 118 (*) 70 - 99 mg/dL  GLUCOSE, CAPILLARY     Status: Abnormal   Collection Time    06/22/13  9:10 PM      Result Value Ref Range   Glucose-Capillary 143 (*) 70 - 99 mg/dL     HEENT: normal   Cardio: RRR and no murmur Resp: CTA B/L and unlabored GI: BS positive and non distended, G tube intact. Bumper ok. Generally tender at ostomy No abnl drainage Extremity:  Pulses positive and No Edema Skin:   Intact Neuro: alert ,smiling, Cranial Nerve Abnormalities R central 7, Abnormal Sensory  Abnormal Motor 0/5 RUE and RLE except 2- knee ext, normal on Left and Aphasic. 1- 1+ tone RE including bicep,wrist/fingers, hamstring/gastroc  MAS 2-3 R FF, Thumb flexors, WF Musc/Skel:  Other no  pain with R shoulder ROM Gen NAD. Very alert   Assessment/Plan: 1. Functional deficits secondary to Left MCA infarct which require 3+ hours per day of interdisciplinary therapy in a comprehensive inpatient rehab setting. Physiatrist is providing close team supervision and 24 hour management of active medical problems listed below. Physiatrist and rehab team continue to assess barriers to discharge/monitor patient progress toward functional and medical goals.  FIM: FIM - Bathing Bathing Steps Patient Completed: Chest;Left Arm;Abdomen;Front perineal area;Buttocks;Right upper leg;Left upper leg Bathing: 3: Mod-Patient completes 5-7 24f 10 parts or 50-74%  FIM - Upper Body Dressing/Undressing Upper body dressing/undressing steps patient completed: Thread/unthread left sleeve of pullover shirt/dress;Put head through opening of pull over shirt/dress;Pull shirt over trunk Upper body dressing/undressing: 0: Wears gown/pajamas-no public clothing FIM - Lower Body Dressing/Undressing Lower body dressing/undressing steps patient completed: Thread/unthread left pants leg;Pull pants up/down Lower body dressing/undressing: 2: Max-Patient completed 25-49% of tasks  FIM - Toileting Toileting: 0: No continent bowel/bladder events this shift  FIM - Radio producer Devices: Bedside commode Toilet Transfers: 3-To toilet/BSC: Mod A (lift or lower assist);3-From toilet/BSC: Mod A (lift or lower assist)  FIM - Bed/Chair Transfer Bed/Chair Transfer Assistive Devices: Arm rests;Bed rails;HOB elevated Bed/Chair Transfer: 4: Supine > Sit: Min A (steadying Pt. > 75%/lift 1 leg);4: Bed > Chair or W/C: Min A (steadying Pt. > 75%)  FIM - Locomotion: Wheelchair Distance: 150 Locomotion: Wheelchair: 3: Travels 150 ft or more: maneuvers on rugs and over door sills with moderate assistance  (Pt: 50 - 74%) FIM - Locomotion: Ambulation Locomotion: Ambulation Assistive Devices: Other  (comment) (L rail, ace wrap for DF assist on R LE) Ambulation/Gait Assistance: 3: Mod assist Locomotion: Ambulation: 1: Travels less than 50 ft with maximal assistance (Pt: 25 - 49%)  Comprehension Comprehension Mode: Auditory Comprehension: 2-Understands basic 25 - 49% of the time/requires cueing 51 - 75% of the time  Expression Expression Mode: Verbal Expression: 1-Expresses basis less than 25% of the time/requires cueing greater than 75% of the time.  Social Interaction Social Interaction: 2-Interacts appropriately 25 - 49% of time - Needs frequent redirection.  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 DVT Prophylaxis/Anticoagulation: Pharmaceutical: Lovenox  2. Pain Management: N/A  3. Bipolar disorder/Mood: No signs of distress reported but patient with poor awareness of deficits. Monitor mood for now. LCSW to follow with patient and family.  4. Neuropsych: This patient is not capable of making decisions on her own behalf.  5. DM type 2: Will monitor with bid checks. Continue to titrate insulin as needed---will change to coordinate with bolus feeding schedule. Use SSI for elevated BS.  6. HTN: adjust for better control avoid hypoperfusion/neuro worsening   titrating  labetalol may add ACE-I  7. H/o paranoid schizophrenia: Has been to [facility] in the past due to decompensation. Not on medications as this time--has been on respiridone in the past and has history of non-compliance per records  8. Dysphagia: s/p G-tube   -on bolus feeds   9. Resting Tachycardia:     On water flushes to avoid dehydration.   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

## 2013-06-23 NOTE — Progress Notes (Signed)
Occupational Therapy Session Note  Patient Details  Name: CHAVY AVERA MRN: [id number] Date of Birth: 08-11-1960  Today's Date: 06/23/2013 Time: 1501-1530 Time Calculation (min): 29 min  Skilled Therapeutic Interventions/Progress Updates:    Pt worked on Brewing technologist for the RUE while sitting EOM.  Integrated the tilted stool for moveable chain activity, having pt focus on shoulder flexion.  Pt was able to perform small movements of shoulder flexion with mod demonstrational cueing.  She needs mod facilitation to maintain neutral trunk and pelvis as she tends to elevate the left shoulder.  Pt unable to manage secretions during session and needs mod demonstrational cueing to manage her RUE for positioning on the 1/2 lap tray. Pt transferred back to room at end of the session and then to the bed with min assist.  Placed resting hand splint on the RUE as well.   Therapy Documentation Precautions:  Precautions Precautions: Fall Precaution Comments: right hemiperesis, NPO and maintain HOB >30 deg; MONITOR VITALS; Alert RN for BP >180/>105 and hold activity for BP 220/120, monitor for signs of aspiration or abdominal pain and distention Restrictions Weight Bearing Restrictions: No  Pain: Pain Assessment Pain Assessment: No/denies pain ADL: See FIM for current functional status  Therapy/Group: Individual Therapy  Dane Bloch OTR/L 06/23/2013, 3:35 PM

## 2013-06-23 NOTE — Progress Notes (Signed)
Occupational Therapy Session Note  Patient Details  Name: [name] MRN: [id number] Date of Birth: [birth year]  Today's Date: 06/23/2013 Time: 1126-1208 Time Calculation (min): 42 min  Short Term Goals: Week 3:  OT Short Term Goal 1 (Week 3): Pt will position RUE prior to transfers with min verbal cues OT Short Term Goal 2 (Week 3): Pt will complete LB dressing with min assist OT Short Term Goal 3 (Week 3): Pt will complete toilet transfer with min assist OT Short Term Goal 4 (Week 3): Pt will complete 3/3 toileting steps with mod verbal cues   Skilled Therapeutic Interventions/Progress Updates:    Engaged in ADL retraining with focus on transfers, sit <> stand, standing tolerance, and increased participation in bathing and dressing tasks.  Performed stand pivot transfer bed to w/c to Rt with min assist.  Bathing and dressing completed at sit > stand level at sink with min assist and occasional blocking at Rt knee due to RLE weakness at Rt knee buckling in standing. Pt with increased initiation with bathing, mod verbal and tactile cues for dressing tasks with pt able to don brief and pants this session. Pt with carryover of RUE placement on sink during bathing and when standing with hand over hand to maintain position.  Pt with increased management of saliva, with ability to wipe mouth approx 40% of time without verbal cue.   Therapy Documentation Precautions:  Precautions Precautions: Fall Precaution Comments: right hemiperesis, NPO and maintain HOB >30 deg; MONITOR VITALS; Alert RN for BP >180/>105 and hold activity for BP 220/120, monitor for signs of aspiration or abdominal pain and distention Restrictions Weight Bearing Restrictions: No General: General Amount of Missed OT Time (min): 18 Minutes Vital Signs:   Pain: Pain Assessment Pain Assessment: No/denies pain  See FIM for current functional status  Therapy/Group: Individual Therapy  Simonne Come 06/23/2013, 2:24  PM

## 2013-06-23 NOTE — Progress Notes (Signed)
Speech Language Pathology Daily Session Note  Patient Details  Name: [name] MRN: [id number] Date of Birth: 05/30/1960  Today's Date: 06/23/2013 Time: 4098-1191 Time Calculation (min): 43 min  Short Term Goals: Week 2: SLP Short Term Goal 1 (Week 2): Patient will initiiate 20 swallows per session with contextual and Max multi-modal clinician cues. SLP Short Term Goal 1 - Progress (Week 2): Progressing toward goal SLP Short Term Goal 2 (Week 2): Patient will follow 1-step directions with contextual and Max multi-modal clinician cues. SLP Short Term Goal 3 (Week 2): Patient will identify object from a field of 2 with Mod multi-modal cues. SLP Short Term Goal 4 (Week 2): Patient will approximate speech sounds during automatic speech tasks with Max assist multi-modal cues. SLP Short Term Goal 4 - Progress (Week 2): Progressing toward goal  Skilled Therapeutic Interventions: Skilled treatment session focused on addressing dysphagia and cognitive-linguistic goals.  SLP set-up oral care via suctioning and provided Min verbal and tactile cues to problem solve use of suctioning.  SLP also facilitated session with set-up of ice chip trials via teaspoon with Supervision verbal cues for pacing.  Patient exhibited timely swallows throughout session without overt s/s of aspiration.  SLP also provided cup of water to patient with Min verbal and visual cues for portion control; patient demonstrated cough x1 out 5 sips.  Recommend an objective assessment tomorrow.  During automatic, over-learned verbal sequences, patient attempted articulatory placement, but no voicing elicited. Patient required Max assist multi-modal cues for object discrimination with choice of two. Plan for MBS tomorrow.     FIM:  Comprehension Comprehension Mode: Auditory Comprehension: 2-Understands basic 25 - 49% of the time/requires cueing 51 - 75% of the time Expression Expression Mode: Verbal Expression: 1-Expresses basis  less than 25% of the time/requires cueing greater than 75% of the time. Social Interaction Social Interaction: 2-Interacts appropriately 25 - 49% of time - Needs frequent redirection. Problem Solving Problem Solving: 2-Solves basic 25 - 49% of the time - needs direction more than half the time to initiate, plan or complete simple activities Memory Memory: 2-Recognizes or recalls 25 - 49% of the time/requires cueing 51 - 75% of the time  Pain Pain Assessment Pain Assessment: No/denies pain  Therapy/Group: Individual Therapy  Carmelia Roller., [place] [phone]  [place] 06/23/2013, 12:14 PM

## 2013-06-23 NOTE — Progress Notes (Signed)
NUTRITION FOLLOW UP  Intervention:   1. Enteral nutrition; Continue Glucerna 1.2 350 mL 4 times daily.  This meets intake goals of >/=90% of estimated needs.  Continue current bolus regimen and monitor weights for adequacy.   NUTRITION DIAGNOSIS:  Inadequate oral intake related to inability to eat, dysphagia as evidenced by SLP assessment.   Monitor:  1. Enteral nutrition; initiation with tolerance. Pt to meet >/=90% estimated needs with nutrition support. Met.  2. Wt/wt change; monitor trends. Ongoing.   Assessment:   53 y.o. female with history of HTN, DM, bipolar disorder, who was admitted on 05/27/13 with right facial droop and aphasia. 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   RD met with pt who reports she is tolerating current bolus regimen. Denies nausea or abdominal pain.  Current regimen of 350 mL 4 times daily provides 1680 kcal, 84g protein, and 1135 mL free water which meets 93% estimated energy needs.   Height: Ht Readings from Last 1 Encounters:  05/27/13 _0  (1.676 m)    Weight Status:   Wt Readings from Last 1 Encounters:  06/23/13 162 lb 11.2 oz (73.8 kg)   Re-estimated needs:  Kcal: 1800-1980 Protein: 70-85g Fluid: >1.8 L/day  Skin: intact  Diet Order: NPO   Intake/Output Summary (Last 24 hours) at 06/23/13 1442 Last data filed at 06/22/13 2230  Gross per 24 hour  Intake    550 ml  Output      0 ml  Net    550 ml    Last BM: 4/5   Labs:  No results found for this basename: NA, K, CL, CO2, BUN, CREATININE, CALCIUM, MG, PHOS, GLUCOSE,  in the last 168 hours  CBG (last 3)   Recent Labs  06/22/13 2110 06/23/13 0738 06/23/13 1207   GLUCAP 143* 114* 181*    Scheduled Meds: . antiseptic oral rinse  15 mL Mouth Rinse q12n4p  . aspirin  325 mg Per Tube Daily  . baclofen  5 mg Oral BID  . chlorhexidine  15 mL Mouth Rinse BID  . enoxaparin (LOVENOX) injection  30 mg Subcutaneous Q24H  . feeding supplement (GLUCERNA 1.2 CAL)  350 mL Per Tube QID  . free water  200 mL Per Tube TID WC & HS  . insulin aspart  0-15 Units Subcutaneous TID AC & HS  . insulin aspart  4 Units Subcutaneous TID AC & HS  . insulin glargine  20 Units Subcutaneous BID  . labetalol  300 mg Per NG tube TID  . mupirocin ointment   Nasal BID  . nystatin  5 mL Oral QID  . simvastatin  20 mg Per Tube q1800    Continuous Infusions:    Brynda Greathouse, MS RD LDN Clinical Inpatient Dietitian Pager: [phone] Weekend/After hours pager: [phone]

## 2013-06-24 ENCOUNTER — Inpatient Hospital Stay (HOSPITAL_COMMUNITY): Payer: Medicaid Other | Admitting: Occupational Therapy

## 2013-06-24 ENCOUNTER — Inpatient Hospital Stay (HOSPITAL_COMMUNITY): Payer: Medicaid Other

## 2013-06-24 ENCOUNTER — Inpatient Hospital Stay (HOSPITAL_COMMUNITY): Payer: Medicaid Other | Admitting: Speech Pathology

## 2013-06-24 ENCOUNTER — Inpatient Hospital Stay (HOSPITAL_COMMUNITY): Payer: Medicaid Other | Admitting: Physical Therapy

## 2013-06-24 DIAGNOSIS — E119 Type 2 diabetes mellitus without complications: Secondary | ICD-10-CM

## 2013-06-24 DIAGNOSIS — F319 Bipolar disorder, unspecified: Secondary | ICD-10-CM

## 2013-06-24 DIAGNOSIS — I10 Essential (primary) hypertension: Secondary | ICD-10-CM

## 2013-06-24 DIAGNOSIS — I6340 Cerebral infarction due to embolism of unspecified cerebral artery: Secondary | ICD-10-CM

## 2013-06-24 LAB — GLUCOSE, CAPILLARY
Glucose-Capillary: 109 mg/dL — ABNORMAL HIGH (ref 70–99)
Glucose-Capillary: 170 mg/dL — ABNORMAL HIGH (ref 70–99)
Glucose-Capillary: 114 mg/dL — ABNORMAL HIGH (ref 70–99)
Glucose-Capillary: 110 mg/dL — ABNORMAL HIGH (ref 70–99)

## 2013-06-24 MED ORDER — RAMIPRIL 2.5 MG PO CAPS
2.5000 mg | ORAL_CAPSULE | Freq: Every day | ORAL | Status: DC
  Administered 2013-06-24: 2.5 mg via ORAL
  Filled 2013-06-24 (×3): qty 1

## 2013-06-24 NOTE — Progress Notes (Signed)
Occupational Therapy Session Note  Patient Details  Name: [name] MRN: [id number] Date of Birth: [birth year]  Today's Date: 06/24/2013 Time: 1100-1200 and 8563-1497 (co-tx with PT [phone]) Time Calculation (min): 60 min and 32 min  Short Term Goals: Week 3:  OT Short Term Goal 1 (Week 3): Pt will position RUE prior to transfers with min verbal cues OT Short Term Goal 2 (Week 3): Pt will complete LB dressing with min assist OT Short Term Goal 3 (Week 3): Pt will complete toilet transfer with min assist OT Short Term Goal 4 (Week 3): Pt will complete 3/3 toileting steps with mod verbal cues   Skilled Therapeutic Interventions/Progress Updates:    1) Engaged in ADL retraining with daughter present to observe.  Spoke with PA prior to session to approve bathing at shower level.  Performed stand step transfer w/c to tub bench in walk-in shower with min-mod assist with use of grab bars.  Pt able to adduct RLE with sidestepping to Lt into shower with mod verbal cues. Bathing completed at sit > stand level with mod cues for sequencing and to wash RUE.  Tactile cues at Rt knee for increased placement prior to sit > stand to increase symmetrical standing balance during LB dressing.  Pt able to pull pants over hips this session with blocking at Rt knee.  Discussed current level with daughter and progress towards goals.  Pt with no loss of secretions during entire 60 minute session.    2) Engaged in co-tx with PT with focus on NM re-ed in quadruped.  Pt seated in w/c upon arrival, donned shoes and GiveMohr sling in preparation of ambulation with PT, provided additional skilled verbal cues to promote upright trunk with ambulation with hemi-walker.  Engaged in quadruped with PT providing manual facilitation and tactile cues at hips for trunk and BLE positioning while this therapist provided manual facilitation at RUE shoulder, elbow, and wrist for increased alignment to promote WB through RUE.  Engaged  in weight shifting with advancing LLE and lifting Lt palm to promote additional WB through RUE and RLE.  Pt returned to bed at end of session with stand pivot min assist with increased ability to advance RLE.  Therapy Documentation Precautions:  Precautions Precautions: Fall Precaution Comments: right hemiperesis, NPO and maintain HOB >30 deg; MONITOR VITALS; Alert RN for BP >180/>105 and hold activity for BP 220/120, monitor for signs of aspiration or abdominal pain and distention Restrictions Weight Bearing Restrictions: No Pain:  Pt with no c/o pain  See FIM for current functional status  Therapy/Group: Individual Therapy and Co-Treatment  Madge Therrien, [facility] 06/24/2013, 12:06 PM

## 2013-06-24 NOTE — Progress Notes (Signed)
Subjective/Complaints: 53 y.o. female with history of HTN, DM, bipolar disorder, who was admitted on 05/27/13 with right facial droop and aphasia. 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 2 D echo with EF 55-60%. Carotid dopplers without significant ICA stenosis. 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 Patient with progressive neurologic worsening on 05/29/13 pm with RUE >RLE weakness. Started on hypertonic fluids and transferred to ICU for closer monitoring. Follow up CCT without significant changes. Patient with decrease in LOC with worsening of symptoms  Pt awake, smiling this am Remains aphasic/anarthric Review of Systems - Negative except aphasic not able to consistently do Y/N Objective: Vital Signs: Blood pressure 169/101, pulse 80, temperature 98.2 F (36.8 C), temperature source Oral, resp. rate 18, weight 74.2 kg (163 lb 9.3 oz), SpO2 96.00%. No results found. Results for orders placed during the hospital encounter of 06/04/13 (from the past 72 hour(s))  GLUCOSE, CAPILLARY     Status: Abnormal   Collection Time    06/21/13  7:26 AM      Result Value Ref Range   Glucose-Capillary 128 (*) 70 - 99 mg/dL  GLUCOSE, CAPILLARY     Status: Abnormal   Collection Time    06/21/13 11:28 AM      Result Value Ref Range   Glucose-Capillary 150 (*) 70 - 99 mg/dL  GLUCOSE, CAPILLARY     Status: None   Collection Time    06/21/13  4:42 PM      Result Value Ref Range   Glucose-Capillary 86  70 - 99 mg/dL   Comment 1 Notify RN    GLUCOSE, CAPILLARY     Status: Abnormal   Collection Time    06/21/13  9:20 PM   Result Value Ref Range   Glucose-Capillary 111 (*) 70 - 99 mg/dL   Comment 1 Notify RN    GLUCOSE, CAPILLARY     Status: Abnormal   Collection Time    06/22/13  7:13 AM      Result Value Ref Range   Glucose-Capillary 129 (*) 70 - 99 mg/dL   Comment 1 Notify RN    GLUCOSE, CAPILLARY     Status: Abnormal   Collection Time    06/22/13 11:44 AM      Result Value Ref Range   Glucose-Capillary 115 (*) 70 - 99 mg/dL  GLUCOSE, CAPILLARY     Status: Abnormal   Collection Time    06/22/13  4:56 PM      Result Value Ref Range   Glucose-Capillary 118 (*) 70 - 99 mg/dL  GLUCOSE, CAPILLARY     Status: Abnormal   Collection Time    06/22/13  9:10 PM      Result Value Ref Range   Glucose-Capillary 143 (*) 70 - 99 mg/dL  GLUCOSE, CAPILLARY     Status: Abnormal   Collection Time    06/23/13  7:38 AM      Result Value Ref Range   Glucose-Capillary 114 (*) 70 - 99 mg/dL   Comment 1 Notify RN    GLUCOSE, CAPILLARY     Status: Abnormal   Collection Time    06/23/13 12:07 PM  Result Value Ref Range   Glucose-Capillary 181 (*) 70 - 99 mg/dL   Comment 1 Notify RN    GLUCOSE, CAPILLARY     Status: Abnormal   Collection Time    06/23/13  5:45 PM      Result Value Ref Range   Glucose-Capillary 112 (*) 70 - 99 mg/dL  GLUCOSE, CAPILLARY     Status: Abnormal   Collection Time    06/23/13  9:02 PM      Result Value Ref Range   Glucose-Capillary 138 (*) 70 - 99 mg/dL     HEENT: normal   Cardio: RRR and no murmur Resp: CTA B/L and unlabored GI: BS positive and non distended, G tube intact. Bumper ok. Generally tender at ostomy No abnl drainage Extremity:  Pulses positive and No Edema Skin:   Intact Neuro: alert ,smiling, Cranial Nerve Abnormalities R central 7, Abnormal Sensory  Abnormal Motor 0/5 RUE and RLE except 2- knee ext, normal on Left and Aphasic. 1- 1+ tone RE including bicep,wrist/fingers, hamstring/gastroc  MAS 2-3 R FF, Thumb flexors, WF Musc/Skel:  Other no pain with R  shoulder ROM Gen NAD. Very alert   Assessment/Plan: 1. Functional deficits secondary to Left MCA infarct which require 3+ hours per day of interdisciplinary therapy in a comprehensive inpatient rehab setting. Physiatrist is providing close team supervision and 24 hour management of active medical problems listed below. Physiatrist and rehab team continue to assess barriers to discharge/monitor patient progress toward functional and medical goals.  FIM: FIM - Bathing Bathing Steps Patient Completed: Chest;Left Arm;Abdomen;Front perineal area;Buttocks;Right upper leg;Left upper leg Bathing: 3: Mod-Patient completes 5-7 25f 10 parts or 50-74%  FIM - Upper Body Dressing/Undressing Upper body dressing/undressing steps patient completed: Thread/unthread left sleeve of pullover shirt/dress;Put head through opening of pull over shirt/dress;Pull shirt over trunk Upper body dressing/undressing: 3: Mod-Patient completed 50-74% of tasks FIM - Lower Body Dressing/Undressing Lower body dressing/undressing steps patient completed: Thread/unthread left pants leg;Pull pants up/down Lower body dressing/undressing: 2: Max-Patient completed 25-49% of tasks  FIM - Toileting Toileting: 0: No continent bowel/bladder events this shift  FIM - Radio producer Devices: Bedside commode Toilet Transfers: 3-To toilet/BSC: Mod A (lift or lower assist);3-From toilet/BSC: Mod A (lift or lower assist)  FIM - Bed/Chair Transfer Bed/Chair Transfer Assistive Devices: Arm rests Bed/Chair Transfer: 4: Chair or W/C > Bed: Min A (steadying Pt. > 75%);4: Bed > Chair or W/C: Min A (steadying Pt. > 75%) (mat <> w/c)  FIM - Locomotion: Wheelchair Distance: 150 Locomotion: Wheelchair: 3: Travels 150 ft or more: maneuvers on rugs and over door sills with moderate assistance  (Pt: 50 - 74%) FIM - Locomotion: Ambulation Locomotion: Ambulation Assistive Devices: Other (comment) (L rail, ace wrap for DF  assist on R LE) Ambulation/Gait Assistance: 3: Mod assist Locomotion: Ambulation: 1: Travels less than 50 ft with maximal assistance (Pt: 25 - 49%)  Comprehension Comprehension Mode: Auditory Comprehension: 2-Understands basic 25 - 49% of the time/requires cueing 51 - 75% of the time  Expression Expression Mode: Verbal Expression: 1-Expresses basis less than 25% of the time/requires cueing greater than 75% of the time.  Social Interaction Social Interaction: 2-Interacts appropriately 25 - 49% of time - Needs frequent redirection.  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 DVT Prophylaxis/Anticoagulation: Pharmaceutical: Lovenox  2. Pain Management: N/A  3. Bipolar disorder/Mood: No signs of distress reported but patient with poor awareness of deficits. Monitor mood for now. LCSW to follow with patient and family.  4. Neuropsych: This patient is not capable of making decisions on her own behalf.  5. DM type 2: Will monitor with bid checks. Continue to titrate insulin as needed---will change to coordinate with bolus feeding schedule. Use SSI for elevated BS.  6. HTN: adjust for better control avoid hypoperfusion/neuro worsening   titrating  labetalol will add ACE-I  7. H/o paranoid schizophrenia: Has been to [facility] in the past due to decompensation. Not on medications as this time--has been on respiridone in the past and has history of non-compliance per records  8. Dysphagia: s/p G-tube   -on bolus feeds   9. Resting Tachycardia:     On water flushes to avoid dehydration.   - titrateBB  , Normodyne 300mg  VT TID starting 4/6, HR improved now in 80s monitor bradycardia and  hypotension 10. Hypertonicity:  scheduled baclofen low dose  -right WHO, increasing elbow flexor tone   LOS (Days) 20 A FACE TO FACE EVALUATION WAS PERFORMED  Camron Monday E 06/24/2013, 6:44 AM

## 2013-06-24 NOTE — Procedures (Signed)
Objective Swallowing Evaluation: Modified Barium Swallowing Study  Patient Details  Name: [name] MRN: [id number] Date of Birth: 28-Jan-1961  Today's Date: 06/24/2013 Time: 0900-0928 Time Calculation (min): 28 min  Past Medical History:  Past Medical History  Diagnosis Date  . Hypertension   . Depression   . Diabetes mellitus     ?type (05/29/2013)  . Bipolar affective disorder   . Schizophrenia     [name] 11/28/2007 (05/29/2013)  . Stroke 05/27/2013   Past Surgical History:  Past Surgical History  Procedure Laterality Date  . Cesarean section  ~ 1987  . Tubal ligation     HPI:  [name] is a 53 y.o. female with history of HTN, DM, bipolar disorder, who was admitted on 05/27/13 with right facial droop and aphasia. 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  Patient with progressive neurologic worsening on 05/29/13 pm with RUE >RLE weakness. Started on hypertonic fluids and transferred to ICU for closer monitoring. Follow up CCT without significant changes. Patient with decrease in LOC with worsening of symptoms. EEG done showing frequent sharp activity in left hemisphere s/o focal nonspecific disturbance. Patient admitted to [facility] rehab on 06/04/2013. Patient NPO with PEG placed. Patient has been participating in therapies and has demonstrated increased timeliness of swallow at bedside as a result objective assessment warranted.     Recommendation/Prognosis  Clinical Impression:   Dysphagia Diagnosis: Severe oral phase dysphagia;Mild pharyngeal phase dysphagia Clinical impression: Patient presents with severe oral phase dysphagia characterized by anterior  spillage, poor control of bolus resulting in bolus spilling under tongue, lingual pumping, delayed oral transit, and lingual residue with all consistencies. Patient required intermittent oral suctioning to clear oral residue under tongue. Thin liquids via teaspoon minimized oral phase impairments due to improved portion control and placement of bolus more posteriorly. Patient also presents with a mild pharyngeal phase dysphagia characterized by intermittent delayed swallow initiation and mild vallecular residue with all consistencies. Solid textures improved the timeliness of swallow. 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 Patient not placed on diet at this time due to the amount of effort required to safely consume all textures and liquids.    Swallow Evaluation Recommendations:  Diet Recommendations: NPO Medication Administration: Via alternative means Oral Care Recommendations: Oral care Q4 per protocol;Oral care prior to ice chips;Oral care before and after PO Other Recommendations: Have oral suction available Follow up Recommendations: Home health SLP;Outpatient SLP;24 hour supervision/assistance    Prognosis:  Prognosis for Safe Diet Advancement: Fair Barriers to Reach Goals: Cognitive deficits;Language deficits   Individuals Consulted: Consulted and Agree with Results and Recommendations: Patient unable/family or caregiver not available      SLP Assessment/Plan  Plan:  Speech Therapy Frequency: min 5x/week   Short Term Goals: Week 2: SLP Short Term Goal 1 (Week 2): Patient will initiiate 20 swallows per session with contextual and Max multi-modal clinician cues. SLP Short Term Goal 1 - Progress (Week 2): Progressing toward goal SLP Short Term Goal 2 (Week 2): Patient will follow 1-step directions with contextual and Max multi-modal clinician  cues. SLP Short Term  Goal 3 (Week 2): Patient will identify object from a field of 2 with Mod multi-modal cues. SLP Short Term Goal 4 (Week 2): Patient will approximate speech sounds during automatic speech tasks with Max assist multi-modal cues. SLP Short Term Goal 4 - Progress (Week 2): Progressing toward goal    General: Date of Onset: 06/24/13 Type of Study: Modified Barium Swallowing Study Reason for Referral: Objectively evaluate swallowing function Previous Swallow Assessment: 05/27/2013 Diet Prior to this Study: NPO Temperature Spikes Noted: No Respiratory Status: Room air History of Recent Intubation: No Behavior/Cognition: Alert;Cooperative;Pleasant mood Oral Cavity - Dentition: Adequate natural dentition Oral Motor / Sensory Function: Impaired motor Oral impairment: Other (Comment) (poor motor planning impacting bolus formation) Self-Feeding Abilities: Able to feed self;Needs assist Patient Positioning: Upright in chair Baseline Vocal Quality: Aphonic Anatomy: Within functional limits Pharyngeal Secretions: Not observed secondary MBS   Reason for Referral:   Objectively evaluate swallowing function    Oral Phase: Oral Preparation/Oral Phase Oral Phase: Impaired Oral - Nectar Oral - Nectar Cup: Reduced posterior propulsion;Lingual pumping;Lingual/palatal residue;Delayed oral transit;Holding of bolus (holding of bolus under tongue) Oral - Thin Oral - Thin Teaspoon: Lingual/palatal residue;Lingual pumping Oral - Thin Cup: Right anterior bolus loss;Weak lingual manipulation;Lingual pumping;Reduced posterior propulsion;Holding of bolus;Lingual/palatal residue;Delayed oral transit (holding of bolus under tongue) Oral - Thin Straw: Right anterior bolus loss;Holding of bolus;Lingual pumping;Weak lingual manipulation;Lingual/palatal residue;Delayed oral transit (holding of bolus under tongue) Oral - Solids Oral - Puree: Weak lingual manipulation;Lingual pumping;Reduced posterior  propulsion;Holding of bolus;Lingual/palatal residue;Delayed oral transit Oral - Regular: Lingual/palatal residue   Pharyngeal Phase:  Pharyngeal Phase Pharyngeal Phase: Impaired Pharyngeal - Nectar Pharyngeal - Nectar Cup: Delayed swallow initiation Pharyngeal - Thin Pharyngeal - Thin Teaspoon: Within functional limits Pharyngeal - Thin Cup: Pharyngeal residue - valleculae (trace residue) Pharyngeal - Thin Straw: Other (Comment) (no swallow initiated, bolus cleared via oral suctioning) Pharyngeal - Solids Pharyngeal - Puree: Delayed swallow initiation;Pharyngeal residue - valleculae (trace residue) Pharyngeal - Regular: Pharyngeal residue - valleculae (trace residue)   Cervical Esophageal Phase  Cervical Esophageal Phase Cervical Esophageal Phase: WFL   GN          Minette Manders 06/24/2013, 9:57 AM

## 2013-06-24 NOTE — Progress Notes (Signed)
Physical Therapy Session Note  Patient Details  Name: [name] MRN: [id number] Date of Birth: 1960-03-30  Today's Date: 06/24/2013 Time: 1006-1100 and 1305-1320 (full time [id number] co-treat with OT) Time Calculation (min): 54 min and 15 minutes  Short Term Goals: Week 2:  PT Short Term Goal 1 (Week 2): Pt will perform bed mobility to L and R with min A and 50% cues for initiation and sequencing PT Short Term Goal 1 - Progress (Week 2): Met PT Short Term Goal 2 (Week 2): Pt will perform bed <> w/c transfers with min A and 50% cues for initiation and sequencing PT Short Term Goal 2 - Progress (Week 2): Met PT Short Term Goal 3 (Week 2): Pt will perform w/c mobility x 50' on unit with L hemi technique and mod A with 50% cues for sequencing PT Short Term Goal 3 - Progress (Week 2): Met PT Short Term Goal 4 (Week 2): Pt will perform gait x 25' with LRAD and mod A PT Short Term Goal 4 - Progress (Week 2): Partly met PT Short Term Goal 5 (Week 2): Pt will negotiate 3 stairs with one rail and max A x 1 and 50% cues for sequencing PT Short Term Goal 5 - Progress (Week 2): Partly met Week 3:  PT Short Term Goal 1 (Week 3): Pt will perform bed mobility to L and R with min A and 25% cues for initiation and sequencing PT Short Term Goal 2 (Week 3): Pt will perform bed < w/c transfers with min A and 25% cues for initiation and sequencing PT Short Term Goal 3 (Week 3): Pt will perform w/c mobility x 150' on unit with L hemi technique with SBA and 25% cues for sequencing PT Short Term Goal 4 (Week 3): Pt will perform gait x 25' with LRAD and mod A PT Short Term Goal 5 (Week 3): Pt will negotiate 3 stairs with one rail and max A and 50% cues for sequencing  Skilled Therapeutic Interventions/Progress Updates:   Pt getting PEG bolus from RN.  No c/o pain.  BP in supine: 185/124, HR: 72.  Pt performed supine > sit EOB with min A and improved initiation and advancement of RLE to EOB; BP in sitting  decreased to 157/102 HR: 96.  Pt pointing to stomach indicating need to use BSC.  Pt performed multiple transfers bed <> BSC and sit > stand and prolonged standing at [facility] to doff soiled brief, pt to perform hygiene and don clean brief and pants with min-mod A to maintain trunk control, balance, WB and activation of RLE for balance.  Transferred bed > w/c squat pivot min A.  Performed NMR in gym; returned to room with OT.  PM session: NMR co-treat with OT; performed NMR and gait assessment with GiveMohr sling donned for RUE support and postural control during gait with L hemi-walker and mod-max A with pt advancing RLE with tactile cues for full L lateral weight shift and trunk elongation and facilitation of R pelvic rotation and facilitation of forward weight shift over RLE and tactile cues for activation of hamstring in R stance for knee control.  Continued NMR in quadruped on mat with +2 A with focus on activation of RUE and RLE to weight shift to L and maintain COG in midline during LLE advancement on mat and LUE lifting palm off mat to increase WB through RUE; pt also able to [facility] RLE and advance in various directions.  Returned to sitting  on mat and to return to room with OT.  Therapy Documentation Precautions:  Precautions Precautions: Fall Precaution Comments: right hemiperesis, NPO and maintain HOB >30 deg; MONITOR VITALS; Alert RN for BP >180/>105 and hold activity for BP 220/120, monitor for signs of aspiration or abdominal pain and distention Restrictions Weight Bearing Restrictions: No Pain:  No c/o pain Locomotion : Ambulation Ambulation/Gait Assistance: 3: Mod assist  Other Treatments: Treatments Neuromuscular Facilitation: Right;Lower Extremity;Upper Extremity;Activity to increase coordination;Activity to increase motor control;Activity to increase timing and sequencing;Activity to increase grading;Activity to increase sustained activation;Activity to increase lateral weight  shifting;Activity to increase anterior-posterior weight shifting during gait with LUE support on hemi-walker x 40' and mod A for trunk control secondary to RUE causing trunk flexion and rotation and assistance for R knee control in stance and facilitation of anterior weight shift of COG over R stance LE and prevention of genu recurvatum with verbal and visual demonstration of sequence first.  Pt able to fully advance RLE and place with minimal assistance and cues to initiate.  Fit pt with GiveMohr sling at end of session and will assess with gait during second session for trunk and postural control/balance.    See FIM for current functional status  Therapy/Group: Individual Therapy and Co-Treatment  Raylene Everts [facility] 06/24/2013, 12:53 PM

## 2013-06-25 ENCOUNTER — Inpatient Hospital Stay (HOSPITAL_COMMUNITY): Payer: Medicaid Other | Admitting: Physical Therapy

## 2013-06-25 ENCOUNTER — Inpatient Hospital Stay (HOSPITAL_COMMUNITY): Payer: Medicaid Other | Admitting: Speech Pathology

## 2013-06-25 ENCOUNTER — Inpatient Hospital Stay (HOSPITAL_COMMUNITY): Payer: Medicaid Other | Admitting: Occupational Therapy

## 2013-06-25 DIAGNOSIS — I10 Essential (primary) hypertension: Secondary | ICD-10-CM

## 2013-06-25 DIAGNOSIS — E119 Type 2 diabetes mellitus without complications: Secondary | ICD-10-CM

## 2013-06-25 DIAGNOSIS — I6340 Cerebral infarction due to embolism of unspecified cerebral artery: Secondary | ICD-10-CM

## 2013-06-25 DIAGNOSIS — F319 Bipolar disorder, unspecified: Secondary | ICD-10-CM

## 2013-06-25 LAB — BASIC METABOLIC PANEL
BUN: 17 mg/dL (ref 6–23)
CHLORIDE: 102 meq/L (ref 96–112)
CO2: 27 meq/L (ref 19–32)
Calcium: 9.4 mg/dL (ref 8.4–10.5)
Creatinine, Ser: 0.73 mg/dL (ref 0.50–1.10)
GFR calc non Af Amer: >90 mL/min (ref >90)
Glucose, Bld: 112 mg/dL — ABNORMAL HIGH (ref 70–99)
Potassium: 4 mEq/L (ref 3.7–5.3)
SODIUM: 142 meq/L (ref 137–147)

## 2013-06-25 LAB — CBC
HEMATOCRIT: 36.7 % (ref 36.0–46.0)
Hemoglobin: 12.3 g/dL (ref 12.0–15.0)
MCH: 30.1 pg (ref 26.0–34.0)
MCHC: 33.5 g/dL (ref 30.0–36.0)
MCV: 90 fL (ref 78.0–100.0)
PLATELETS: 337 10*3/uL (ref 150–400)
RBC: 4.08 MIL/uL (ref 3.87–5.11)
RDW: 12.1 % (ref 11.5–15.5)
WBC: 8 10*3/uL (ref 4.0–10.5)

## 2013-06-25 LAB — GLUCOSE, CAPILLARY
GLUCOSE-CAPILLARY: 147 mg/dL — AB (ref 70–99)
Glucose-Capillary: 112 mg/dL — ABNORMAL HIGH (ref 70–99)
Glucose-Capillary: 60 mg/dL — ABNORMAL LOW (ref 70–99)
Glucose-Capillary: 78 mg/dL (ref 70–99)
Glucose-Capillary: 109 mg/dL — ABNORMAL HIGH (ref 70–99)

## 2013-06-25 MED ORDER — RAMIPRIL 5 MG PO CAPS
5.0000 mg | ORAL_CAPSULE | Freq: Every day | ORAL | Status: DC
  Administered 2013-06-26 – 2013-06-30 (×5): 5 mg via ORAL
  Filled 2013-06-25 (×7): qty 1

## 2013-06-25 NOTE — Progress Notes (Signed)
Social Work Patient ID: [name], female   DOB: Aug 14, 1960, 53 y.o.   MRN: [id number]  CSW received a phone call from pt's dtr stating that she needs verification that a social security disability application has been made, as Medicaid is requesting this.  CSW called Marlowe Kays at [facility] who completed the application and left her a message asking her to fax that to CSW so that we could give it to the dtr.  CSW will follow up on the message/request as needed.

## 2013-06-25 NOTE — Progress Notes (Signed)
Subjective/Complaints: 53 y.o. female with history of HTN, DM, bipolar disorder, who was admitted on 05/27/13 with right facial droop and aphasia. 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 2 D echo with EF 55-60%. Carotid dopplers without significant ICA stenosis. 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 Patient with progressive neurologic worsening on 05/29/13 pm with RUE >RLE weakness. Started on hypertonic fluids and transferred to ICU for closer monitoring. Follow up CCT without significant changes. Patient with decrease in LOC with worsening of symptoms  Pt awake, smiling this am Remains aphasic/anarthric Review of Systems - Negative except aphasic not able to consistently do Y/N Objective: Vital Signs: Blood pressure 158/103, pulse 93, temperature 98.3 F (36.8 C), temperature source Oral, resp. rate 18, weight 73.7 kg (162 lb 7.7 oz), SpO2 98.00%. Dg Swallowing [facility]  06/24/2013   Jalene Mullet, [place]     06/24/2013  4:47 PM   Objective Swallowing Evaluation: Modified Barium Swallowing Study   Patient Details  Name: [name] MRN: [id number] Date of Birth: Jul 27, 1960  Today's Date: 06/24/2013 Time: 0900-0928 Time Calculation (min): 28 min  Past Medical History:  Past Medical History  Diagnosis Date  . Hypertension   . Depression   . Diabetes mellitus     ?type (05/29/2013)  . Bipolar affective disorder   . Schizophrenia     [name] 11/28/2007 (05/29/2013)  . Stroke 05/27/2013   Past Surgical History:  Past Surgical History  Procedure Laterality Date  . Cesarean section  ~ 1987  . Tubal ligation     HPI:   [name] is a 53 y.o. female with history of HTN, DM,  bipolar disorder, who was admitted on 05/27/13 with right facial  droop and aphasia. 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   Patient with progressive neurologic worsening on 05/29/13 pm with  RUE >RLE weakness. Started on hypertonic fluids and transferred  to ICU for closer monitoring. Follow up CCT without significant  changes. Patient with decrease in LOC with worsening of symptoms.  EEG done showing frequent sharp activity in left hemisphere s/o  focal nonspecific disturbance. Patient admitted to [facility] rehab on  06/04/2013. Patient NPO with PEG placed. Patient has been  participating in therapies and has demonstrated increased  timeliness of swallow at bedside as a result objective assessment  warranted.     Recommendation/Prognosis  Clinical Impression:   Dysphagia Diagnosis: Severe oral phase dysphagia;Mild pharyngeal  phase dysphagia Clinical impression: Patient presents with severe oral phase  dysphagia characterized by anterior spillage, poor control of  bolus resulting in bolus spilling under tongue, lingual pumping,  delayed oral transit, and lingual residue with all consistencies.  Patient required intermittent oral suctioning to clear oral  residue under tongue. Thin liquids via teaspoon minimized oral  phase impairments due to improved portion control and placement  of bolus more posteriorly. Patient also  presents with a mild  pharyngeal phase dysphagia characterized by intermittent delayed  swallow initiation and mild vallecular residue with all  consistencies. Solid textures improved the timeliness of swallow.  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 Patient not placed on  diet at this time due to the amount of effort required to safely  consume all textures and liquids.    Swallow Evaluation Recommendations:  Diet Recommendations: NPO Medication Administration: Via alternative means Oral Care Recommendations: Oral care Q4 per protocol;Oral care  prior to ice chips;Oral care before and after PO Other Recommendations: Have oral suction available Follow up Recommendations: Home health SLP;Outpatient SLP;24 hour  supervision/assistance    Prognosis:  Prognosis for Safe Diet Advancement: Fair Barriers to Reach Goals: Cognitive deficits;Language deficits   Individuals Consulted: Consulted and Agree with Results and  Recommendations: Patient unable/family or caregiver not available      SLP Assessment/Plan  Plan:  Speech Therapy Frequency: min 5x/week   Short Term Goals: Week 2: SLP Short Term Goal 1 (Week 2): Patient  will initiiate 20 swallows per session with contextual and Max  multi-modal clinician cues. SLP Short Term Goal 1 - Progress (Week 2): Progressing toward  goal SLP Short Term Goal 2 (Week 2): Patient will follow 1-step  directions with contextual and Max multi-modal clinician cues. SLP Short Term Goal 3 (Week 2): Patient will identify object from  a field of 2 with Mod multi-modal cues. SLP Short Term Goal 4 (Week 2): Patient will approximate speech  sounds during automatic speech tasks with Max assist multi-modal  cues. SLP Short Term Goal 4 - Progress (Week 2): Progressing toward  goal    General: Date of Onset: 06/24/13 Type of Study: Modified Barium Swallowing Study Reason for Referral: Objectively evaluate swallowing function Previous Swallow Assessment: 05/27/2013 Diet Prior to this Study: NPO Temperature Spikes Noted: No Respiratory Status: Room air History of Recent Intubation: No  Behavior/Cognition: Alert;Cooperative;Pleasant mood Oral Cavity - Dentition: Adequate natural dentition Oral Motor / Sensory Function: Impaired motor Oral impairment: Other (Comment) (poor motor planning impacting  bolus formation) Self-Feeding Abilities: Able to feed self;Needs assist Patient Positioning: Upright in chair Baseline Vocal Quality: Aphonic Anatomy: Within functional limits Pharyngeal Secretions: Not observed secondary MBS   Reason for Referral:   Objectively evaluate swallowing function    Oral Phase: Oral Preparation/Oral Phase Oral Phase: Impaired Oral - Nectar Oral - Nectar Cup: Reduced posterior propulsion;Lingual  pumping;Lingual/palatal residue;Delayed oral transit;Holding of  bolus (holding of bolus under tongue) Oral - Thin Oral - Thin Teaspoon: Lingual/palatal residue;Lingual pumping Oral - Thin Cup: Right anterior bolus loss;Weak lingual  manipulation;Lingual pumping;Reduced posterior propulsion;Holding  of bolus;Lingual/palatal residue;Delayed oral transit (holding of  bolus under tongue) Oral - Thin Straw: Right anterior bolus loss;Holding of  bolus;Lingual pumping;Weak lingual manipulation;Lingual/palatal  residue;Delayed oral transit (holding of bolus under tongue) Oral - Solids Oral - Puree: Weak lingual manipulation;Lingual pumping;Reduced  posterior propulsion;Holding of bolus;Lingual/palatal  residue;Delayed oral transit Oral - Regular: Lingual/palatal residue   Pharyngeal Phase:  Pharyngeal Phase Pharyngeal Phase: Impaired Pharyngeal - Nectar Pharyngeal - Nectar Cup: Delayed swallow initiation Pharyngeal - Thin Pharyngeal - Thin Teaspoon: Within functional limits Pharyngeal - Thin Cup: Pharyngeal residue - valleculae (trace  residue) Pharyngeal - Thin Straw: Other (Comment) (no swallow initiated,  bolus cleared via oral suctioning) Pharyngeal - Solids Pharyngeal - Puree: Delayed swallow initiation;Pharyngeal  residue  - valleculae (trace residue) Pharyngeal - Regular: Pharyngeal  residue - valleculae (trace  residue)   Cervical Esophageal Phase  Cervical Esophageal Phase Cervical Esophageal Phase: WFL   GN          Cook,Chelsea 06/24/2013, 9:57 AM                    Results for orders placed during the hospital encounter of 06/04/13 (from the past 72 hour(s))  GLUCOSE, CAPILLARY     Status: Abnormal   Collection Time    06/22/13 11:44 AM      Result Value Ref Range   Glucose-Capillary 115 (*) 70 - 99 mg/dL  GLUCOSE, CAPILLARY     Status: Abnormal   Collection Time    06/22/13  4:56 PM      Result Value Ref Range   Glucose-Capillary 118 (*) 70 - 99 mg/dL  GLUCOSE, CAPILLARY     Status: Abnormal   Collection Time    06/22/13  9:10 PM      Result Value Ref Range   Glucose-Capillary 143 (*) 70 - 99 mg/dL  GLUCOSE, CAPILLARY     Status: Abnormal   Collection Time    06/23/13  7:38 AM      Result Value Ref Range   Glucose-Capillary 114 (*) 70 - 99 mg/dL   Comment 1 Notify RN    GLUCOSE, CAPILLARY     Status: Abnormal   Collection Time    06/23/13 12:07 PM      Result Value Ref Range   Glucose-Capillary 181 (*) 70 - 99 mg/dL   Comment 1 Notify RN    GLUCOSE, CAPILLARY     Status: Abnormal   Collection Time    06/23/13  5:45 PM      Result Value Ref Range   Glucose-Capillary 112 (*) 70 - 99 mg/dL  GLUCOSE, CAPILLARY     Status: Abnormal   Collection Time    06/23/13  9:02 PM      Result Value Ref Range   Glucose-Capillary 138 (*) 70 - 99 mg/dL  GLUCOSE, CAPILLARY     Status: Abnormal   Collection Time    06/24/13  7:44 AM      Result Value Ref Range   Glucose-Capillary 109 (*) 70 - 99 mg/dL   Comment 1 Notify RN    GLUCOSE, CAPILLARY     Status: Abnormal   Collection Time    06/24/13 12:04 PM      Result Value Ref Range   Glucose-Capillary 170 (*) 70 - 99 mg/dL   Comment 1 Notify RN    GLUCOSE, CAPILLARY     Status: Abnormal   Collection Time    06/24/13  4:40 PM      Result Value Ref Range   Glucose-Capillary 114 (*) 70 - 99 mg/dL   Comment 1  Notify RN    GLUCOSE, CAPILLARY     Status: Abnormal   Collection Time    06/24/13  9:16 PM      Result Value Ref Range   Glucose-Capillary 110 (*) 70 - 99 mg/dL  CBC     Status: None   Collection Time    06/25/13  7:01 AM      Result Value Ref Range   WBC 8.0  4.0 - 10.5 K/uL   RBC 4.08  3.87 - 5.11 MIL/uL   Hemoglobin 12.3  12.0 - 15.0 g/dL   HCT 36.7  36.0 - 46.0 %  MCV 90.0  78.0 - 100.0 fL   MCH 30.1  26.0 - 34.0 pg   MCHC 33.5  30.0 - 36.0 g/dL   RDW 12.1  11.5 - 15.5 %   Platelets 337  150 - 400 K/uL  GLUCOSE, CAPILLARY     Status: Abnormal   Collection Time    06/25/13  7:45 AM      Result Value Ref Range   Glucose-Capillary 112 (*) 70 - 99 mg/dL   Comment 1 Notify RN       HEENT: normal   Cardio: RRR and no murmur Resp: CTA B/L and unlabored GI: BS positive and non distended, G tube intact. Bumper ok. Generally tender at ostomy No abnl drainage Extremity:  Pulses positive and No Edema Skin:   Intact Neuro: alert ,smiling, Cranial Nerve Abnormalities R central 7, Abnormal Sensory  Abnormal Motor 0/5 RUE and RLE except 2- knee ext, normal on Left and Aphasic. 1- 1+ tone RE including bicep,wrist/fingers, hamstring/gastroc  MAS 2-3 R FF, Thumb flexors, WF Musc/Skel:  Other no pain with R shoulder ROM Gen NAD. Very alert   Assessment/Plan: 1. Functional deficits secondary to Left MCA infarct which require 3+ hours per day of interdisciplinary therapy in a comprehensive inpatient rehab setting. Physiatrist is providing close team supervision and 24 hour management of active medical problems listed below. Physiatrist and rehab team continue to assess barriers to discharge/monitor patient progress toward functional and medical goals. Team conference today please see physician documentation under team conference tab, met with team face-to-face to discuss problems,progress, and goals. Formulized individual treatment plan based on medical history, underlying problem and  comorbidities.  FIM: FIM - Bathing Bathing Steps Patient Completed: Chest;Left Arm;Abdomen;Front perineal area;Right upper leg;Left upper leg Bathing: 3: Mod-Patient completes 5-7 [id number] parts or 50-74%  FIM - Upper Body Dressing/Undressing Upper body dressing/undressing steps patient completed: Thread/unthread left bra strap;Thread/unthread left sleeve of pullover shirt/dress;Put head through opening of pull over shirt/dress;Pull shirt over trunk Upper body dressing/undressing: 3: Mod-Patient completed 50-74% of tasks FIM - Lower Body Dressing/Undressing Lower body dressing/undressing steps patient completed: Thread/unthread left underwear leg;Thread/unthread left pants leg;Pull pants up/down Lower body dressing/undressing: 2: Max-Patient completed 25-49% of tasks  FIM - Toileting Toileting: 0: No continent bowel/bladder events this shift  FIM - TRadio producerDevices: BRecruitment consultantTransfers: 4-From toilet/BSC: Min A (steadying Pt. > 75%);4-To toilet/BSC: Min A (steadying Pt. > 75%)  FIM - Bed/Chair Transfer Bed/Chair Transfer Assistive Devices: HOB elevated;Bed rails;Arm rests Bed/Chair Transfer: 4: Supine > Sit: Min A (steadying Pt. > 75%/lift 1 leg);4: Bed > Chair or W/C: Min A (steadying Pt. > 75%);4: Chair or W/C > Bed: Min A (steadying Pt. > 75%)  FIM - Locomotion: Wheelchair Distance: 150 Locomotion: Wheelchair: 1: Total Assistance/staff pushes wheelchair (Pt<25%) FIM - Locomotion: Ambulation Locomotion: Ambulation Assistive Devices: WMuseum/gallery curatorAmbulation/Gait Assistance: 3: Mod assist Locomotion: Ambulation: 1: Travels less than 50 ft with moderate assistance (Pt: 50 - 74%)  Comprehension Comprehension Mode: Auditory Comprehension: 2-Understands basic 25 - 49% of the time/requires cueing 51 - 75% of the time  Expression Expression Mode: Verbal Expression: 1-Expresses basis less than 25% of the time/requires cueing greater than  75% of the time.  Social Interaction Social Interaction: 2-Interacts appropriately 25 - 49% of time - Needs frequent redirection.  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 DVT Prophylaxis/Anticoagulation: Pharmaceutical: Lovenox  2. Pain Management: N/A  3. Bipolar disorder/Mood: No signs of distress reported but patient with poor awareness of deficits. Monitor mood for now. LCSW to follow with patient and family.  4. Neuropsych: This patient is not capable of making decisions on her own behalf.  5. DM type 2: Will monitor with bid checks. Continue to titrate insulin as needed---will change to coordinate with bolus feeding schedule. Use SSI for elevated BS.  6. HTN: adjust for better control avoid hypoperfusion/neuro worsening   titrating  labetalol will add ACE-I  7. H/o paranoid schizophrenia: Has been to [facility] in the past due to decompensation. Not on medications as this time--has been on respiridone in the past and has history of non-compliance per records  8. Dysphagia: s/p G-tube   -on bolus feeds   9. Resting Tachycardia:     On water flushes to avoid dehydration.   - titrateBB  , Normodyne 350m VT TID starting 4/6, HR improved now in 80s monitor bradycardia and hypotension 10. Hypertonicity:  scheduled baclofen low dose  -right WHO, increasing elbow flexor tone   LOS (Days) 21 A FACE TO FACE EVALUATION WAS PERFORMED  ACharlett Blake4/10/2013, 8:03 AM

## 2013-06-25 NOTE — Progress Notes (Signed)
Orthopedic Tech Progress Note Patient Details:  [name] 21-Nov-1960 [id number]  Patient ID: [name], female   DOB: 04-Aug-1960, 53 y.o.   MRN: [id number] Called in [place] order; spoke with Glenis Smoker 06/25/2013, 1:02 PM

## 2013-06-25 NOTE — Progress Notes (Signed)
Physical Therapy Weekly Progress Note  Patient Details  Name: [name] MRN: [id number] Date of Birth: [birth year]  Beginning of progress report period: June 17, 2013 End of progress report period: June 25, 2013  Today's Date: 06/25/2013 Time: 536-144 and 3154-0086 Time Calculation (min): 45 min and 52 min  Patient has made good progress and has met 4 of 5 short term goals.  Pt is currently min A for bed mobility, basic transfers and static standing balance but continues to require mod-max A for gait with hemi-walker and for stair negotiation with one rail.  Pt to be evaluated for AFO to improve gait efficiency, safety and reduce falls risk during gait.  Pt is scheduled to D/C home next week with family; daughter has been present to observe therapy but will likely need to begin hands on gait and transfer training over the course of a few days prior to D/C.    Patient continues to demonstrate the following deficits: R hemiplegia with impaired motor control, timing and sequencing, apraxia, impaired postural control, balance, gait, cognition and therefore will continue to benefit from skilled PT intervention to enhance overall performance with balance, postural control, ability to compensate for deficits, functional use of  right upper extremity and right lower extremity, attention and coordination.  Patient progressing toward long term goals..  Continue plan of care.  PT Short Term Goals Week 3:  PT Short Term Goal 1 (Week 3): Pt will perform bed mobility to L and R with min A and 25% cues for initiation and sequencing PT Short Term Goal 1 - Progress (Week 3): Met PT Short Term Goal 2 (Week 3): Pt will perform bed < w/c transfers with min A and 25% cues for initiation and sequencing PT Short Term Goal 2 - Progress (Week 3): Met PT Short Term Goal 3 (Week 3): Pt will perform w/c mobility x 150' on unit with L hemi technique with SBA and 25% cues for sequencing PT Short Term Goal 3 -  Progress (Week 3): Progressing toward goal PT Short Term Goal 4 (Week 3): Pt will perform gait x 25' with LRAD and mod A PT Short Term Goal 4 - Progress (Week 3): Met PT Short Term Goal 5 (Week 3): Pt will negotiate 3 stairs with one rail and max A and 50% cues for sequencing PT Short Term Goal 5 - Progress (Week 3): Met Week 4:  PT Short Term Goal 1 (Week 4): = LTG of min A overall  Skilled Therapeutic Interventions/Progress Updates:   Pt resting in bed; pointing to brief.  Pt noted to have incontinent bowel and bladder in brief.  Performed bed mobility/rolling training in bed with rails with min A and mod-max verbal cues for rolling sequence to assist with hygiene and changing of brief; also performed multiple bridges in bed to don pants with min A.  Performed bed mobility and transfer bed > w/c squat pivot min A.  In ADL apartment pt educated on and return demonstrated all w/c parts management and set up for transfers with max verbal, tactile and visual cues for sequencing.  Performed stand pivot transfer training w/c <> ADL bed with LUE support on hemi walker and min A for balance and verbal cues for sequencing of stand pivot with hemi walker.  Performed bed mobility training on regular flat bed, no rail with focus on sequencing of rolling to L and R and sequence for sit <> supine with use of LLE and LUE to assist RLE  on/off bed and RUE crossing midline during rolling; performed x 2 reps with min A overall.  Returned to w/c and to room for speech therapy session.   PM session: daughter present; discussed home set up and stair situation for home entry/exit.  Reports 2 steps at back entrance with R rail.  Focus of therapy session on family education and hands on training for car transfers and stair negotiation.  Also performed gait and AFO assessment with P&O present to determine most appropriate AFO for safe and efficient home ambulation.  Performed gait with mod A and L hemi walker without facilitation  to allow P&O to assess gait; pt presented with R hip flexion and advancement but compensates with trunk posterior lean, rotation and genu recurvatum and hip ER in stance for stability.  P&O recommending blue rocker AFO with heel wedge to assist with foot clearance and anterior translation of COG with increased stability and knee control.  P&O to bring blue rocker tomorrow for assessment.  Continued with family education.  Demonstrated to pt and daughter how to perform stand pivot w/c <> car with UE support on car door; pt gave repeat demonstration car <> w/c with min-mod A for balance while pivoting and max verbal cues for sequence with pt having increased difficulty sequencing R lateral stepping and retro stepping causing pt increased frustration.  When return demonstrated with daughter pt did not step but pivoted with feet together on slick floor.  Recommending to daughter that we repeat car transfer outside with real car.  Continued with stair negotiation training; demonstrated to pt and daughter stair negotiation up/down 3 steps laterally with LUE support on rail to ascend and forwards to descend with step to sequence.  PT and pt performed together with mod A for stabilization of RLE in stance and to assist full clearance of RLE.  Daughter and pt gave repeat demonstration with daughter providing mod A and verbal cues for safe step to sequence.  Will continue to practice once pt has AFO.  Returned to room and to bed min A; pt did not recall sit > supine sequence from the morning and required increased assistance to transfer to supine.  Will continue to practice.   Therapy Documentation Precautions:  Precautions Precautions: Fall Precaution Comments: right hemiperesis, NPO and maintain HOB >30 deg; MONITOR VITALS; Alert RN for BP >180/>105 and hold activity for BP 220/120, monitor for signs of aspiration or abdominal pain and distention Restrictions Weight Bearing Restrictions: No Vital Signs: Therapy  Vitals Temp: 98.5 F (36.9 C) Pulse Rate: 82 Resp: 18 BP: 173/107 mmHg Patient Position, if appropriate: Lying Oxygen Therapy SpO2: 95 % Pain: Pain Assessment Pain Assessment: No/denies pain  See FIM for current functional status  Therapy/Group: Individual Therapy  [name] 06/25/2013, 4:55 PM

## 2013-06-25 NOTE — Procedures (Signed)
The assessment and plan has been reviewed and SLP is in agreement. Gazelle Towe, M.A., CCC-SLP [phone]  

## 2013-06-25 NOTE — Progress Notes (Signed)
Occupational Therapy Session Note  Patient Details  Name: [name] MRN: [id number] Date of Birth: 15-Feb-1961  Today's Date: 06/25/2013 Time: 8127-5170 Time Calculation (min): 42 min  Short Term Goals: Week 3:  OT Short Term Goal 1 (Week 3): Pt will position RUE prior to transfers with min verbal cues OT Short Term Goal 2 (Week 3): Pt will complete LB dressing with min assist OT Short Term Goal 3 (Week 3): Pt will complete toilet transfer with min assist OT Short Term Goal 4 (Week 3): Pt will complete 3/3 toileting steps with mod verbal cues   Skilled Therapeutic Interventions/Progress Updates:    Engaged in ADL retraining with focus on toilet transfer and toileting and NM re-ed with RUE PROM/AAROM.  Upon arrival pt dressed and seated up in w/c.  Pt making gestures, with questioning realize pt needing to toilet.  Performed stand pivot to [facility] with min assist to Lt and mod assist when returning back to w/c to Rt.  Pt able to pull down pants this session, however required assist with hygiene secondary to smear of BM and requiring assist to pull pants up.  Pt required tactile cues at Rt knee to promote upright standing during hygiene and clothing management.  In therapy gym engaged in PROM attempting to facilitate AAROM in Rt shoulder with support under elbow to increase alignment and decrease gravity.  Pt continues to demonstrate no activation in RUE.  Therapy Documentation Precautions:  Precautions Precautions: Fall Precaution Comments: right hemiperesis, NPO and maintain HOB >30 deg; MONITOR VITALS; Alert RN for BP >180/>105 and hold activity for BP 220/120, monitor for signs of aspiration or abdominal pain and distention Restrictions Weight Bearing Restrictions: No General:   Vital Signs: Therapy Vitals Pulse Rate: 96 BP: 174/97 mmHg Patient Position, if appropriate: Sitting Pain: Pain Assessment Pain Assessment: No/denies pain Pain Score: 0-No pain  See FIM for current  functional status  Therapy/Group: Individual Therapy  Holli Humbles Alazae Crymes 06/25/2013, 11:21 AM

## 2013-06-25 NOTE — Progress Notes (Signed)
Speech Language Pathology Daily Session Note  Patient Details  Name: [name] MRN: [id number] Date of Birth: [birth year]  Today's Date: 06/25/2013 Time: 0950-1030 Time Calculation (min): 40 min  Short Term Goals: Week 2: SLP Short Term Goal 1 (Week 2): Patient will initiiate 20 swallows per session with contextual and Max multi-modal clinician cues. SLP Short Term Goal 1 - Progress (Week 2): Progressing toward goal SLP Short Term Goal 2 (Week 2): Patient will follow 1-step directions with contextual and Max multi-modal clinician cues. SLP Short Term Goal 3 (Week 2): Patient will identify object from a field of 2 with Mod multi-modal cues. SLP Short Term Goal 4 (Week 2): Patient will approximate speech sounds during automatic speech tasks with Max assist multi-modal cues. SLP Short Term Goal 4 - Progress (Week 2): Progressing toward goal  Skilled Therapeutic Interventions: Skilled treatment session focused on addressing dysphagia and cognitive-linguistic goals.  SLP facilitated session with set-up of Dys.1 textures and thin liquids via cup.  Patient consumed trials with multiple swallows, followed by suctioning with minimal residue.  Patient demonstrated hard reflexive cough x1 due to suspected coordination difficulty.  During automatic, over-learned verbal sequences, patient attempted articulatory placement, with seconds of voicing intermittently.  Patient required Max assist multi-modal cues for object discrimination with choice of two. Continue with current plan of care.      FIM:  Comprehension Comprehension Mode: Auditory Comprehension: 2-Understands basic 25 - 49% of the time/requires cueing 51 - 75% of the time Expression Expression Mode: Verbal Expression: 1-Expresses basis less than 25% of the time/requires cueing greater than 75% of the time. Social Interaction Social Interaction: 2-Interacts appropriately 25 - 49% of time - Needs frequent redirection. Problem  Solving Problem Solving: 2-Solves basic 25 - 49% of the time - needs direction more than half the time to initiate, plan or complete simple activities Memory Memory: 2-Recognizes or recalls 25 - 49% of the time/requires cueing 51 - 75% of the time FIM - Eating Eating Activity: 4: Help with picking up utensils;4: Help with managing cup/glass;4: Helper checks for pocketed food (with trials)  Pain Pain Assessment Pain Assessment: No/denies pain  Therapy/Group: Individual Therapy  Carmelia Roller., CCC-SLP [place] 06/25/2013, 4:34 PM

## 2013-06-26 ENCOUNTER — Inpatient Hospital Stay (HOSPITAL_COMMUNITY): Payer: Medicaid Other | Admitting: Speech Pathology

## 2013-06-26 ENCOUNTER — Inpatient Hospital Stay (HOSPITAL_COMMUNITY): Payer: Medicaid Other | Admitting: Physical Therapy

## 2013-06-26 ENCOUNTER — Inpatient Hospital Stay (HOSPITAL_COMMUNITY): Payer: Medicaid Other | Admitting: *Deleted

## 2013-06-26 ENCOUNTER — Inpatient Hospital Stay (HOSPITAL_COMMUNITY): Payer: Medicaid Other | Admitting: Occupational Therapy

## 2013-06-26 LAB — GLUCOSE, CAPILLARY
GLUCOSE-CAPILLARY: 147 mg/dL — AB (ref 70–99)
GLUCOSE-CAPILLARY: 103 mg/dL — AB (ref 70–99)
GLUCOSE-CAPILLARY: 147 mg/dL — AB (ref 70–99)
Glucose-Capillary: 116 mg/dL — ABNORMAL HIGH (ref 70–99)

## 2013-06-26 MED ORDER — HYDROCHLOROTHIAZIDE 10 MG/ML ORAL SUSPENSION
6.2500 mg | Freq: Every day | ORAL | Status: DC
  Administered 2013-06-26 – 2013-07-01 (×6): 6.25 mg via ORAL
  Filled 2013-06-26 (×9): qty 1.25

## 2013-06-26 MED ORDER — FREE WATER: 200.0000 mL | Freq: Three times a day (TID) | Status: DC

## 2013-06-26 MED ORDER — FREE WATER
200.0000 mL | Freq: Every day | Status: DC
  Administered 2013-06-26 – 2013-07-04 (×42): 200 mL

## 2013-06-26 MED ORDER — LIVING WELL WITH DIABETES BOOK
Freq: Once | Status: AC
  Administered 2013-06-26: 09:00:00
  Filled 2013-06-26: qty 1

## 2013-06-26 NOTE — Progress Notes (Signed)
Subjective/Complaints: 53 y.o. female with history of HTN, DM, bipolar disorder, who was admitted on 05/27/13 with right facial droop and aphasia. 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 2 D echo with EF 55-60%. Carotid dopplers without significant ICA stenosis. 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 Patient with progressive neurologic worsening on 05/29/13 pm with RUE >RLE weakness. Started on hypertonic fluids and transferred to ICU for closer monitoring. Follow up CCT without significant changes. Patient with decrease in LOC with worsening of symptoms  Discussed with SLP yesterday, dysphagia mainly oral phase Review of Systems - Negative except aphasic not able to consistently do Y/N Objective: Vital Signs: Blood pressure 161/100, pulse 89, temperature 98.2 F (36.8 C), temperature source Oral, resp. rate 18, weight 73.5 kg (162 lb 0.6 oz), SpO2 95.00%. Dg Swallowing [facility] Pathology  06/24/2013   [name], [place]     06/24/2013  4:47 PM   Objective Swallowing Evaluation: Modified Barium Swallowing Study   Patient Details  Name: [name] MRN: [id number] Date of Birth: 1960-08-14  Today's Date: 06/24/2013 Time: 0900-0928 Time Calculation (min): 28 min  Past Medical History:  Past Medical History  Diagnosis Date  . Hypertension   . Depression   . Diabetes mellitus     ?type (05/29/2013)  . Bipolar affective disorder   . Schizophrenia     [name] 11/28/2007 (05/29/2013)  . Stroke 05/27/2013   Past Surgical History:  Past Surgical History  Procedure Laterality Date  . Cesarean section  ~ 1987  . Tubal ligation      HPI:  [name] is a 53 y.o. female with history of HTN, DM,  bipolar disorder, who was admitted on 05/27/13 with right facial  droop and aphasia. 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   Patient with progressive neurologic worsening on 05/29/13 pm with  RUE >RLE weakness. Started on hypertonic fluids and transferred  to ICU for closer monitoring. Follow up CCT without significant  changes. Patient with decrease in LOC with worsening of symptoms.  EEG done showing frequent sharp activity in left hemisphere s/o  focal nonspecific disturbance. Patient admitted to [facility] rehab on  06/04/2013. Patient NPO with PEG placed. Patient has been  participating in therapies and has demonstrated increased  timeliness of swallow at bedside as a result objective assessment  warranted.     Recommendation/Prognosis  Clinical Impression:   Dysphagia Diagnosis: Severe oral phase dysphagia;Mild pharyngeal  phase dysphagia Clinical impression: Patient presents with severe oral phase  dysphagia characterized by anterior spillage, poor control of  bolus resulting in bolus spilling under tongue, lingual pumping,  delayed oral transit, and lingual residue with all consistencies.  Patient required intermittent oral suctioning to clear oral  residue under tongue. Thin liquids via teaspoon minimized oral  phase impairments due to improved portion control and placement  of bolus more posteriorly. Patient  also presents with a mild  pharyngeal phase dysphagia characterized by intermittent delayed  swallow initiation and mild vallecular residue with all  consistencies. Solid textures improved the timeliness of swallow.   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 Patient not placed on  diet at this time due to the amount of effort required to safely  consume all textures and liquids.    Swallow Evaluation Recommendations:  Diet Recommendations: NPO Medication Administration: Via alternative means Oral Care Recommendations: Oral care Q4 per protocol;Oral care  prior to ice chips;Oral care before and after PO Other Recommendations: Have oral suction available Follow up Recommendations: Home health SLP;Outpatient SLP;24 hour  supervision/assistance    Prognosis:  Prognosis for Safe Diet Advancement: Fair Barriers to Reach Goals: Cognitive deficits;Language deficits   Individuals Consulted: Consulted and Agree with Results and  Recommendations: Patient unable/family or caregiver not available      SLP Assessment/Plan  Plan:  Speech Therapy Frequency: min 5x/week   Short Term Goals: Week 2: SLP Short Term Goal 1 (Week 2): Patient  will initiiate 20 swallows per session with contextual and Max  multi-modal clinician cues. SLP Short Term Goal 1 - Progress (Week 2): Progressing toward  goal SLP Short Term Goal 2 (Week 2): Patient will follow 1-step  directions with contextual and Max multi-modal clinician cues. SLP Short Term Goal 3 (Week 2): Patient will identify object from  a field of 2 with Mod multi-modal cues. SLP Short Term Goal 4 (Week 2): Patient will approximate speech  sounds during automatic speech tasks with Max assist multi-modal  cues. SLP Short Term Goal 4 - Progress (Week 2): Progressing toward  goal    General: Date of Onset: 06/24/13 Type of Study: Modified Barium Swallowing Study Reason for Referral: Objectively evaluate swallowing function Previous Swallow Assessment: 05/27/2013 Diet Prior to this Study: NPO Temperature Spikes Noted: No Respiratory Status: Room air History of Recent  Intubation: No Behavior/Cognition: Alert;Cooperative;Pleasant mood Oral Cavity - Dentition: Adequate natural dentition Oral Motor / Sensory Function: Impaired motor Oral impairment: Other (Comment) (poor motor planning impacting  bolus formation) Self-Feeding Abilities: Able to feed self;Needs assist Patient Positioning: Upright in chair Baseline Vocal Quality: Aphonic Anatomy: Within functional limits Pharyngeal Secretions: Not observed secondary MBS   Reason for Referral:   Objectively evaluate swallowing function    Oral Phase: Oral Preparation/Oral Phase Oral Phase: Impaired Oral - Nectar Oral - Nectar Cup: Reduced posterior propulsion;Lingual  pumping;Lingual/palatal residue;Delayed oral transit;Holding of  bolus (holding of bolus under tongue) Oral - Thin Oral - Thin Teaspoon: Lingual/palatal residue;Lingual pumping Oral - Thin Cup: Right anterior bolus loss;Weak lingual  manipulation;Lingual pumping;Reduced posterior propulsion;Holding  of bolus;Lingual/palatal residue;Delayed oral transit (holding of  bolus under tongue) Oral - Thin Straw: Right anterior bolus loss;Holding of  bolus;Lingual pumping;Weak lingual manipulation;Lingual/palatal  residue;Delayed oral transit (holding of bolus under tongue) Oral - Solids Oral - Puree: Weak lingual manipulation;Lingual pumping;Reduced  posterior propulsion;Holding of bolus;Lingual/palatal  residue;Delayed oral transit Oral - Regular: Lingual/palatal residue   Pharyngeal Phase:  Pharyngeal Phase Pharyngeal Phase: Impaired Pharyngeal - Nectar Pharyngeal - Nectar Cup: Delayed swallow initiation Pharyngeal - Thin Pharyngeal - Thin Teaspoon: Within functional limits Pharyngeal - Thin Cup: Pharyngeal residue - valleculae (trace  residue) Pharyngeal - Thin Straw: Other (Comment) (no swallow initiated,  bolus cleared via oral suctioning) Pharyngeal - Solids Pharyngeal - Puree: Delayed swallow  initiation;Pharyngeal residue  - valleculae (trace residue) Pharyngeal - Regular:  Pharyngeal residue - valleculae (trace  residue)   Cervical Esophageal Phase  Cervical Esophageal Phase Cervical Esophageal Phase: WFL   GN          Cook,[place] 06/24/2013, 9:57 AM                    Results for orders placed during the hospital encounter of 06/04/13 (from the past 72 hour(s))  GLUCOSE, CAPILLARY     Status: Abnormal   Collection Time    06/23/13  7:38 AM      Result Value Ref Range   Glucose-Capillary 114 (*) 70 - 99 mg/dL   Comment 1 Notify RN    GLUCOSE, CAPILLARY     Status: Abnormal   Collection Time    06/23/13 12:07 PM      Result Value Ref Range   Glucose-Capillary 181 (*) 70 - 99 mg/dL   Comment 1 Notify RN    GLUCOSE, CAPILLARY     Status: Abnormal   Collection Time    06/23/13  5:45 PM      Result Value Ref Range   Glucose-Capillary 112 (*) 70 - 99 mg/dL  GLUCOSE, CAPILLARY     Status: Abnormal   Collection Time    06/23/13  9:02 PM      Result Value Ref Range   Glucose-Capillary 138 (*) 70 - 99 mg/dL  GLUCOSE, CAPILLARY     Status: Abnormal   Collection Time    06/24/13  7:44 AM      Result Value Ref Range   Glucose-Capillary 109 (*) 70 - 99 mg/dL   Comment 1 Notify RN    GLUCOSE, CAPILLARY     Status: Abnormal   Collection Time    06/24/13 12:04 PM      Result Value Ref Range   Glucose-Capillary 170 (*) 70 - 99 mg/dL   Comment 1 Notify RN    GLUCOSE, CAPILLARY     Status: Abnormal   Collection Time    06/24/13  4:40 PM      Result Value Ref Range   Glucose-Capillary 114 (*) 70 - 99 mg/dL   Comment 1 Notify RN    GLUCOSE, CAPILLARY     Status: Abnormal   Collection Time    06/24/13  9:16 PM      Result Value Ref Range   Glucose-Capillary 110 (*) 70 - 99 mg/dL  BASIC METABOLIC PANEL     Status: Abnormal   Collection Time    06/25/13  7:01 AM      Result Value Ref Range   Sodium 142  137 - 147 mEq/L   Potassium 4.0  3.7 - 5.3 mEq/L   Chloride 102  96 - 112 mEq/L   CO2 27  19 - 32 mEq/L   Glucose, Bld 112 (*) 70 - 99 mg/dL   BUN 17  6  - 23 mg/dL   Creatinine, Ser 0.73  0.50 - 1.10 mg/dL   Calcium 9.4  8.4 - 10.5 mg/dL   GFR calc non Af Amer >90  >90 mL/min   GFR calc Af Amer >90  >90 mL/min   Comment: (NOTE)     The eGFR has been calculated using the CKD EPI equation.     This calculation has not been validated in all clinical situations.     eGFR's persistently <90 mL/min signify possible Chronic Kidney     Disease.  CBC  Status: None   Collection Time    06/25/13  7:01 AM      Result Value Ref Range   WBC 8.0  4.0 - 10.5 K/uL   RBC 4.08  3.87 - 5.11 MIL/uL   Hemoglobin 12.3  12.0 - 15.0 g/dL   HCT 36.7  36.0 - 46.0 %   MCV 90.0  78.0 - 100.0 fL   MCH 30.1  26.0 - 34.0 pg   MCHC 33.5  30.0 - 36.0 g/dL   RDW 12.1  11.5 - 15.5 %   Platelets 337  150 - 400 K/uL  GLUCOSE, CAPILLARY     Status: Abnormal   Collection Time    06/25/13  7:45 AM      Result Value Ref Range   Glucose-Capillary 112 (*) 70 - 99 mg/dL   Comment 1 Notify RN    GLUCOSE, CAPILLARY     Status: Abnormal   Collection Time    06/25/13 11:40 AM      Result Value Ref Range   Glucose-Capillary 147 (*) 70 - 99 mg/dL  GLUCOSE, CAPILLARY     Status: Abnormal   Collection Time    06/25/13  4:44 PM      Result Value Ref Range   Glucose-Capillary 60 (*) 70 - 99 mg/dL   Comment 1 Notify RN    GLUCOSE, CAPILLARY     Status: None   Collection Time    06/25/13  5:15 PM      Result Value Ref Range   Glucose-Capillary 78  70 - 99 mg/dL  GLUCOSE, CAPILLARY     Status: Abnormal   Collection Time    06/25/13  7:53 PM      Result Value Ref Range   Glucose-Capillary 109 (*) 70 - 99 mg/dL     HEENT: normal   Cardio: RRR and no murmur Resp: CTA B/L and unlabored GI: BS positive and non distended, G tube intact. Bumper ok. Generally tender at ostomy No abnl drainage Extremity:  Pulses positive and No Edema Skin:   Intact Neuro: alert ,smiling, Cranial Nerve Abnormalities R central 7, Abnormal Sensory  Abnormal Motor 0/5 RUE and RLE except 2-  knee ext, normal on Left and Aphasic. 1- 1+ tone RE including bicep,wrist/fingers, hamstring/gastroc  MAS 2-3 R FF, Thumb flexors, WF Musc/Skel:  Other no pain with R shoulder ROM Gen NAD. Very alert   Assessment/Plan: 1. Functional deficits secondary to Left MCA infarct which require 3+ hours per day of interdisciplinary therapy in a comprehensive inpatient rehab setting. Physiatrist is providing close team supervision and 24 hour management of active medical problems listed below. Physiatrist and rehab team continue to assess barriers to discharge/monitor patient progress toward functional and medical goals.   FIM: FIM - Bathing Bathing Steps Patient Completed: Chest;Left Arm;Abdomen;Front perineal area;Right upper leg;Left upper leg Bathing: 3: Mod-Patient completes 5-7 32f10 parts or 50-74%  FIM - Upper Body Dressing/Undressing Upper body dressing/undressing steps patient completed: Thread/unthread left bra strap;Thread/unthread left sleeve of pullover shirt/dress;Put head through opening of pull over shirt/dress;Pull shirt over trunk Upper body dressing/undressing: 3: Mod-Patient completed 50-74% of tasks FIM - Lower Body Dressing/Undressing Lower body dressing/undressing steps patient completed: Thread/unthread left underwear leg;Thread/unthread left pants leg;Pull pants up/down Lower body dressing/undressing: 2: Max-Patient completed 25-49% of tasks  FIM - Toileting Toileting steps completed by patient: Adjust clothing prior to toileting;Adjust clothing after toileting Toileting Assistive Devices: Grab bar or rail for support Toileting: 3: Mod-Patient completed 2 of 3  steps  FIM - Radio producer Devices: Bedside commode Toilet Transfers: 4-From toilet/BSC: Min A (steadying Pt. > 75%);4-To toilet/BSC: Min A (steadying Pt. > 75%)  FIM - Bed/Chair Transfer Bed/Chair Transfer Assistive Devices: HOB elevated;Bed rails;Arm rests Bed/Chair Transfer: 4:  Supine > Sit: Min A (steadying Pt. > 75%/lift 1 leg);4: Bed > Chair or W/C: Min A (steadying Pt. > 75%);4: Chair or W/C > Bed: Min A (steadying Pt. > 75%);4: Sit > Supine: Min A (steadying pt. > 75%/lift 1 leg)  FIM - Locomotion: Wheelchair Distance: 150 Locomotion: Wheelchair: 1: Total Assistance/staff pushes wheelchair (Pt<25%) FIM - Locomotion: Ambulation Locomotion: Ambulation Assistive Devices: Museum/gallery curator Ambulation/Gait Assistance: 3: Mod assist Locomotion: Ambulation: 1: Travels less than 50 ft with moderate assistance (Pt: 50 - 74%)  Comprehension Comprehension Mode: Auditory Comprehension: 2-Understands basic 25 - 49% of the time/requires cueing 51 - 75% of the time  Expression Expression Mode: Nonverbal Expression: 1-Expresses basis less than 25% of the time/requires cueing greater than 75% of the time.  Social Interaction Social Interaction: 2-Interacts appropriately 25 - 49% of time - Needs frequent redirection.  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 DVT Prophylaxis/Anticoagulation: Pharmaceutical: Lovenox  2. Pain Management: N/A  3. Bipolar disorder/Mood: No signs of distress reported but patient with poor awareness of deficits. Monitor mood for now. LCSW to follow with patient and family.  4. Neuropsych: This patient is not capable of making decisions on her own behalf.  5. DM type 2: Will monitor with bid checks. Continue to titrate insulin as needed---will change to coordinate with bolus feeding schedule. Use SSI for elevated BS.  6. HTN: adjust for better control avoid hypoperfusion/neuro worsening   Labetalol at max, ACE I not effective,  will trial HCTZ 7. H/o paranoid schizophrenia: Has been to [facility] in the past due to decompensation. Not on medications as this time--has been on respiridone in the past and has history of non-compliance per records  8. Dysphagia: s/p G-tube   -on bolus feeds   9. Resting Tachycardia:     improved    , Normodyne 329m VT TID starting 4/6, HR improved now in 80s monitor bradycardia 10. Hypertonicity:  scheduled baclofen low dose  -right WHO, increasing elbow flexor tone   LOS (Days) 22 A FACE TO FACE EVALUATION WAS PERFORMED  ACharlett Blake4/11/2013, 7:05 AM

## 2013-06-26 NOTE — Progress Notes (Signed)
Occupational Therapy Weekly Progress Note  Patient Details  Name: [name] MRN: [id number] Date of Birth: 1960/11/29  Beginning of progress report period: June 19, 2013 End of progress report period: June 26, 2013  Today's Date: 06/26/2013 Time: 1107-1200 Time Calculation (min): 53 min  Patient has met 2 of 4 short term goals.  Pt is currently min A for bed mobility, basic stand pivot transfers and static standing balance but continues to require mod-max A for transfers with hemi-walker. Pt's apraxia effects her ability to complete hemi-dressing technique, will require continued repetition. Pt is scheduled to D/C home next week with family; daughter has been present to observe therapy but will likely need to begin hands on transfer training and providing cues/assist with self-care tasks over the course of a few days prior to D/C.   Patient continues to demonstrate the following deficits: Rt hemiplegia with impaired motor control, timing and sequencing, apraxia, impaired postural control, balance, cognition and therefore will continue to benefit from skilled OT intervention to enhance overall performance with BADL and Reduce care partner burden.  Patient not progressing toward long term goals.  See goal revision..  Plan of care revisions: downgraded LB dressing and toileting goals.  OT Short Term Goals Week 3:  OT Short Term Goal 1 (Week 3): Pt will position RUE prior to transfers with min verbal cues OT Short Term Goal 1 - Progress (Week 3): Met OT Short Term Goal 2 (Week 3): Pt will complete LB dressing with min assist OT Short Term Goal 2 - Progress (Week 3): Progressing toward goal OT Short Term Goal 3 (Week 3): Pt will complete toilet transfer with min assist OT Short Term Goal 3 - Progress (Week 3): Met OT Short Term Goal 4 (Week 3): Pt will complete 3/3 toileting steps with mod verbal cues  OT Short Term Goal 4 - Progress (Week 3): Progressing toward goal Week 4:  OT Short  Term Goal 1 (Week 4): LTGs = STGs due to remaining LOS  Skilled Therapeutic Interventions/Progress Updates:    Engaged in ADL retraining with focus on transfers, sit > stand, standing balance, and hemi-dressing technique.  Pt received up in recliner, performed stand pivot transfer to w/c min assist.  Bathing completed at sink with mod verbal and tactile cues to attend to RUE during bathing with continued decreased awareness of RUE.  Educated pt on hemi-dressing technique with LB dressing with having pt thread RLE first by crossing Rt ankle over Lt knee to thread brief and pants.  Pt required demonstration with threading brief as unable to understand secondary to apraxia, followed by ability to don pants following hemi-technique.  Pt continues to require assist with donning socks and shoes.  Pt with little carryover of hemi-technique with UB dressing, requiring setup, cues, and redirection.  Pt with improved standing balance and ability to pull brief and pants over hips while therapist provided steady assist in standing and stability at Rt knee.  Therapy Documentation Precautions:  Precautions Precautions: Fall Precaution Comments: right hemiperesis, NPO and maintain HOB >30 deg; MONITOR VITALS; Alert RN for BP >180/>105 and hold activity for BP 220/120, monitor for signs of aspiration or abdominal pain and distention Restrictions Weight Bearing Restrictions: No General: General Amount of Missed OT Time (min): 7 Minutes Vital Signs:   Pain: Pain Assessment Pain Assessment: No/denies pain Pain Score: 0-No pain  See FIM for current functional status  Therapy/Group: Individual Therapy  Kerrie [place] 06/26/2013, 12:13 PM

## 2013-06-26 NOTE — Progress Notes (Signed)
Physical Therapy Session Note  Patient Details  Name: [name] MRN: [id number] Date of Birth: 1960-10-27  Today's Date: 06/26/2013 Time: 8937-3428 Time Calculation (min): 27 min  Short Term Goals: Week 4:  PT Short Term Goal 1 (Week 4): = LTG of min A overall  Skilled Therapeutic Interventions/Progress Updates:    Patient received supine in bed, initially shaking head "no", but able to be encouraged to participate. Session focused on functional transfers, bed mobility, and R UE/LE NMR. Patient requires minA for sit<>supine and stand pivot transfers with manual facilitation to increase weight bearing thorough R LE. Bed mobility activities on flat mat: proper positioning prior to movement, rolling to B sides, sit<>supine, and bridging with emphasis on increased/forced use of R UE/LE as well as hemi compensatory strategies. In supine, R LE PNF D1 pattern, noted improved activation of R hip/knee flexors with progression of activity. Repeated seated R trunk flexion to increase weight bearing through R elbow. Patient returned to room and left supine in bed with pillow positioned under R UE, bed alarm on, and daughter present. See below for vitals.  Therapy Documentation Precautions:  Precautions Precautions: Fall Precaution Comments: right hemiperesis, NPO and maintain HOB >30 deg; MONITOR VITALS; Alert RN for BP >180/>105 and hold activity for BP 220/120, monitor for signs of aspiration or abdominal pain and distention Restrictions Weight Bearing Restrictions: No Vital Signs: Therapy Vitals Pulse Rate: 101 BP: 165/97 mmHg Patient Position, if appropriate: Lying Oxygen Therapy SpO2: 98 % O2 Device: None (Room air) Pain: Pain Assessment Pain Assessment: No/denies pain Pain Score: 0-No pain  See FIM for current functional status  Therapy/Group: Individual Therapy  Lillia Abed. Famous Eisenhardt, PT, DPT 06/26/2013, 2:51 PM

## 2013-06-26 NOTE — Progress Notes (Signed)
Social Work Patient ID: [name], female   DOB: 10-Nov-1960, 53 y.o.   MRN: [id number]  Met with pt and daughter yesterday afternoon to review team conference.  Both aware and agreeable with plans continuing toward 4/15 d/c.  Daughter has begun family education.  Understands that she will also be providing assist with tube feedings and DM management.  Daughter denies any concerns at this point about being able to manage care at home.  MA and SSD apps are still pending and daughter aware that I can only get limited [place] visits upon d/c.  Will need assistance with meds and medical management post d/c as well.  Lennart Pall, LCSW

## 2013-06-26 NOTE — Progress Notes (Signed)
Physical Therapy Session Note  Patient Details  Name: [name] MRN: [id number] Date of Birth: April 22, 1960  Today's Date: 06/26/2013 Time: 0737-1062 Time Calculation (min): 58 min  Short Term Goals: Week 4:  PT Short Term Goal 1 (Week 4): = LTG of min A overall  Skilled Therapeutic Interventions/Progress Updates:   Therapy session focused on AFO fitting/trial for improved efficiency and safety with gait and stair negotiation. Pt received supine in bed with prosthetist and daughter present. Pt transferred supine <> sit and stand pivot transfer no AD bed <> w/c with min A. In gym, performed gait training with minimalist shoes and R blue rocker AFO with heel wedge using L hemiwalker and modA x 20 ft. Pt performed gait training x 20 ft and x 40 ft with more cushioned New Balance shoes. Pt requires verbal cues/therapist foot blocking patient's foot for smaller R step length for decreased compensation with posterior lean, rotation, and hip ER and to limit knee hyperextension. Pt performed gait training x 20 ft and x 40 ft with more cushioned New Balance shoes and R blue rocker AFO/heel wedge. Prosthetist educated pt and daughter that minimalist shoe with less cushion may cause pt to have more knee hyperextension. In New Balance shoes, pt noted to have decreased time spent in knee hyperextension moment vs minimalist shoes. AFO noted to assist with foot clearance and provide increased stability/knee control. Pt negotiated up/down 3 stairs using L rail with AFO donned with step-to pattern and modA; pt continues to require assist to clear RLE when ascending and to safely place foot on step/stabilize RLE when descending. Prosthetist reported he will trim foot plate for increased ease of donning/doffing AFO with different shoes and deliver AFO to pt. Continue AFO gait training for functional mobility. Pt propelled w/c 75 ft using L hemi technique with min assist and mod-max verbal cues for sequencing/steering.  Pt returned to room and requested to return to bed, all needs within reach, bed alarm set, with daughter present.   Therapy Documentation Precautions:  Precautions Precautions: Fall Precaution Comments: right hemiperesis, NPO and maintain HOB >30 deg; MONITOR VITALS; Alert RN for BP >180/>105 and hold activity for BP 220/120, monitor for signs of aspiration or abdominal pain and distention Restrictions Weight Bearing Restrictions: No Vital Signs: Therapy Vitals Temp: 98 F (36.7 C) Temp src: Oral Pulse Rate: 101 Resp: 18 BP: 165/97 mmHg Patient Position, if appropriate: Lying Oxygen Therapy SpO2: 98 % O2 Device: None (Room air) Pain: Pain Assessment Pain Assessment: No/denies pain Pain Score: 0-No pain  See FIM for current functional status  Therapy/Group: Individual Therapy  Laretta Alstrom 06/26/2013, 3:12 PM

## 2013-06-26 NOTE — Progress Notes (Signed)
Speech Language Pathology Daily Session Note  Patient Details  Name: [name] MRN: [id number] Date of Birth: Mar 03, 1961  Today's Date: 06/26/2013 Time: 0950-1030 Time Calculation (min): 40 min  Short Term Goals: Week 2: SLP Short Term Goal 1 (Week 2): Patient will initiiate 20 swallows per session with contextual and Max multi-modal clinician cues. SLP Short Term Goal 1 - Progress (Week 2): Progressing toward goal SLP Short Term Goal 2 (Week 2): Patient will follow 1-step directions with contextual and Max multi-modal clinician cues. SLP Short Term Goal 3 (Week 2): Patient will identify object from a field of 2 with Mod multi-modal cues. SLP Short Term Goal 4 (Week 2): Patient will approximate speech sounds during automatic speech tasks with Max assist multi-modal cues. SLP Short Term Goal 4 - Progress (Week 2): Progressing toward goal  Skilled Therapeutic Interventions: Skilled treatment session focused on addressing dysphagia and cognitive-linguistic goals.  Patient gesturing to SLP upon arrival, with yes/no questions and Mod cues SLP able to determine patient needed to go to the bathroom.  RN and SLP assisted in bed to commode transfer and SLP facilitated self-care with Mod multi-modal cues to follow 1-step commands.  SLP also facilitated session with set-up of Dys.1 textures and thin liquids via cup.  Patient consumed trials with multiple swallows, followed by suctioning with minimal residue x2 during session.  Patient consumed 2oz. puree and 2oz. Sprite Zero during session with hard reflexive cough x1 due to large cup sip, resulting in coordination difficulty and suspected aspiration.  Hard reflexive cough appeared to clear suspected aspirates.  Patient demonstrated voicing intermittently in agreement throughout session.  Continue with current plan of care.      FIM:  Comprehension Comprehension Mode: Auditory Comprehension: 2-Understands basic 25 - 49% of the time/requires cueing  51 - 75% of the time Expression Expression Mode: Verbal Expression: 1-Expresses basis less than 25% of the time/requires cueing greater than 75% of the time. Social Interaction Social Interaction: 2-Interacts appropriately 25 - 49% of time - Needs frequent redirection. Problem Solving Problem Solving: 2-Solves basic 25 - 49% of the time - needs direction more than half the time to initiate, plan or complete simple activities Memory Memory: 2-Recognizes or recalls 25 - 49% of the time/requires cueing 51 - 75% of the time FIM - Eating Eating Activity: 4: Help with picking up utensils;4: Help with managing cup/glass;4: Helper checks for pocketed food (with trials)  Pain Pain Assessment Pain Assessment: No/denies pain Pain Score: 0-No pain  Therapy/Group: Individual Therapy  Carmelia Roller., [place]  Mora Appl 06/26/2013, 11:07 AM

## 2013-06-26 NOTE — Patient Care Conference (Signed)
Inpatient RehabilitationTeam Conference and Plan of Care Update Date: 06/25/2013   Time: 11:15 AM    Patient Name: [name]      Medical Record Number: [id number]  Date of Birth: 05/28/1960 Sex: Female         Room/Bed: 4M09C/4M09C-01 Payor Info: Payor: MEDICAID PENDING / Plan: MEDICAID PENDING / Product Type: *No Product type* /    Admitting Diagnosis: L MCA CVA  Admit Date/Time:  06/04/2013  2:30 PM Admission Comments: No comment available   Primary Diagnosis:  <principal problem not specified> Principal Problem: <principal problem not specified>  Patient Active Problem List   Diagnosis Date Noted  . CVA (cerebral infarction) 06/04/2013  . Dysphagia 06/02/2013  . Benign polycythemia 06/02/2013  . Malignant hypertension 05/28/2013  . DM (diabetes mellitus) type II controlled, neurological manifestation 05/27/2013  . Acute embolic stroke 03/50/0938  . 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 Hypertensive emergency 05/27/2013  . Stroke 05/27/2013  . Aphasia complicating stroke 18/29/9371    Expected Discharge Date: Expected Discharge Date: 07/02/13  Team Members Present: Physician leading conference: Dr. Alysia Penna Social Worker Present: Alfonse Alpers, LCSW;Marlana Mckowen [place], [place] Nurse Present: Rayetta Pigg, RN PT Present: Raylene Everts, PT;Emily Parcell, PT OT Present: Simonne Come, Maryella Shivers, OT SLP Present: Gunnar Fusi, SLP PPS Coordinator present : Ileana Ladd, Lelan Pons, RN, CRRN     Current Status/Progress Goal Weekly Team Focus  Medical   non  verbal, NPO, getting some return on [place] B and B  improve B and B continence  Family training   Bowel/Bladder   incontinent of bowel and bladder  manage incontinence with min assist  offer toileting, monitor BMs   Swallow/Nutrition/ Hydration   Max assist for oral phase impairments; PO trials regular, puree and thin with SLP  Mod assist with PO trials  continue to increase AP transit   ADL's   mod assist bathing and UB dressing, mod-max assist LB dressing, min-mod assist stand pivot transfers  min assist overall  activity tolerance, standing balance, transfers, ADL retraining, RUE NM re-ed   Mobility   Min A bed mobility and basic transfers, mod-max A gait and stair negotiation with Give[employer]r sling and hemi walker  Min A overall  Postural control, gait training, activation of R side   Communication   Max assist   Mod assist   continue to increase initiation; object ID    Safety/Cognition/ Behavioral Observations  Max assist   Mod assist   increase initiation and basis problem solving    Pain   n/a  remain free of pain  assess pain indicators   Skin   peg site with split gauze, continues to have scant to small amounts of bloody drainage  peg site heals with no infection, no skin breakdown  assess skin qshift and prn; reposition in bed q2-3h    Rehab Goals Patient on target to meet rehab goals: Yes *See Care Plan and progress notes for long and short-term goals.  Barriers to Discharge: severe weakness, oral phase dysphagia    Possible Resolutions to Barriers:  fam ed    Discharge Planning/Teaching Needs:  daughter to provide 24/7 assistance at home      Team Discussion:  Nice return in LE function - need AFO. Peg tube and DM management education with daughter needs to take place.  May be close to verbalizing some.  Family ed with daughter - SW to confirm she is still capable of providing 24/7 assistance  Revisions to Treatment Plan:  None   Continued Need for Acute Rehabilitation Level of Care: The patient requires daily medical management by a physician with specialized training in physical medicine and rehabilitation for the following conditions: Daily direction of a multidisciplinary physical rehabilitation program to ensure safe treatment while eliciting the highest outcome that is of practical value to the patient.: Yes Daily medical management of patient stability  for increased activity during participation in an intensive rehabilitation regime.: Yes Daily analysis of laboratory values and/or radiology reports with any subsequent need for medication adjustment of medical intervention for : Other;Neurological problems  Lennart Pall 06/26/2013, 1:28 PM

## 2013-06-27 ENCOUNTER — Inpatient Hospital Stay (HOSPITAL_COMMUNITY): Payer: Medicaid Other | Admitting: Physical Therapy

## 2013-06-27 ENCOUNTER — Inpatient Hospital Stay (HOSPITAL_COMMUNITY): Payer: Medicaid Other | Admitting: Occupational Therapy

## 2013-06-27 ENCOUNTER — Inpatient Hospital Stay (HOSPITAL_COMMUNITY): Payer: Medicaid Other | Admitting: Speech Pathology

## 2013-06-27 DIAGNOSIS — F319 Bipolar disorder, unspecified: Secondary | ICD-10-CM

## 2013-06-27 DIAGNOSIS — I10 Essential (primary) hypertension: Secondary | ICD-10-CM

## 2013-06-27 DIAGNOSIS — I6340 Cerebral infarction due to embolism of unspecified cerebral artery: Secondary | ICD-10-CM

## 2013-06-27 DIAGNOSIS — E119 Type 2 diabetes mellitus without complications: Secondary | ICD-10-CM

## 2013-06-27 LAB — URINE MICROSCOPIC-ADD ON

## 2013-06-27 LAB — GLUCOSE, CAPILLARY
GLUCOSE-CAPILLARY: 97 mg/dL (ref 70–99)
GLUCOSE-CAPILLARY: 133 mg/dL — AB (ref 70–99)
Glucose-Capillary: 144 mg/dL — ABNORMAL HIGH (ref 70–99)
Glucose-Capillary: 141 mg/dL — ABNORMAL HIGH (ref 70–99)

## 2013-06-27 LAB — URINALYSIS, ROUTINE W REFLEX MICROSCOPIC
BILIRUBIN URINE: NEGATIVE
Glucose, UA: NEGATIVE mg/dL
Hgb urine dipstick: NEGATIVE
Ketones, ur: NEGATIVE mg/dL
Nitrite: NEGATIVE
Protein, ur: NEGATIVE mg/dL
Specific Gravity, Urine: 1.019 (ref 1.005–1.030)
UROBILINOGEN UA: 1 mg/dL (ref 0.0–1.0)
pH: 6.5 (ref 5.0–8.0)

## 2013-06-27 MED ORDER — CEPHALEXIN 250 MG PO CAPS
250.0000 mg | ORAL_CAPSULE | Freq: Three times a day (TID) | ORAL | Status: DC
  Administered 2013-06-27 – 2013-07-04 (×21): 250 mg via ORAL
  Filled 2013-06-27 (×24): qty 1

## 2013-06-27 NOTE — Progress Notes (Signed)
Physical Therapy Session Note  Patient Details  Name: [name] MRN: [id number] Date of Birth: 08-May-1960  Today's Date: 06/27/2013 Time: 0900-0955 Time Calculation (min): 55 min  Short Term Goals: Week 4:  PT Short Term Goal 1 (Week 4): = LTG of min A overall  Skilled Therapeutic Interventions/Progress Updates:   Pt resting in bed with daughter present.  Performed supine > sit with min A and sat EOB to don clothes and shoes with AFO with verbal cues for clothing sequencing.  Transferred bed > w/c stand pivot min A.  Performed NMR with LiteGait; see below for details.  During gait training pt grimacing and holding stomach.  Returned to room and performed stand pivots w/c <> BSC min A and static standing with min A for hygiene and clothing/brief management with pt assisting with clothing doffing and donning and verbal and tactile cues given for upright postural control (pt tends to stand with R trunk flexion and forwards rotation to stabilize R hip) and for R lateral weight shift and activation of R quad in stance.  Retrieved new/clean cushion from w/c supply room for pt and pt stood with min A to replace cushion.  Pt to remain in w/c until OT session.  Therapy Documentation Precautions:  Precautions Precautions: Fall Precaution Comments: right hemiperesis, NPO and maintain HOB >30 deg; MONITOR VITALS; Alert RN for BP >180/>105 and hold activity for BP 220/120, monitor for signs of aspiration or abdominal pain and distention Restrictions Weight Bearing Restrictions: No Vital Signs: Therapy Vitals Pulse Rate: 91 BP: 151/91 mmHg Patient Position, if appropriate: Sitting Oxygen Therapy SpO2: 94 % O2 Device: None (Room air) Pain: Pain Assessment Pain Assessment: No/denies pain Pain Score: 0-No pain Locomotion : Ambulation Ambulation/Gait Assistance: 1: +2 Total assist  Other Treatments: Treatments Neuromuscular Facilitation: Right;Upper Extremity;Lower Extremity;Activity to  increase coordination;Forced use;Activity to increase motor control;Activity to increase timing and sequencing;Activity to increase sustained activation;Activity to increase lateral weight shifting;Activity to increase anterior-posterior weight shifting in LiteGait on treadmill at 0.6 mph x 5:28 x 23ft with focus on full L lateral weight shifting, pt initiation and activation of R swing for full clearance and activation of R glute and quad in stance with anterior weight shift of COG over R stance; required max A and total verbal, tactile cues to facilitate weight shift and activation of RLE.  Pt tolerated well but was very fatigued after.  During treadmill training RUE was ace wrapped to handle to maintain positioning and input during gait.    See FIM for current functional status  Therapy/Group: Individual Therapy  [name] 06/27/2013, 9:57 AM

## 2013-06-27 NOTE — Progress Notes (Signed)
Speech Language Pathology Weekly Progress and Session Note  Patient Details  Name: [name] MRN: [id number] Date of Birth: [birth year]  Beginning of progress report period: June 20, 2013 End of progress report period: June 27, 2013  Today's Date: 06/27/2013 Time: 0923-3007 Time Calculation (min): 40 min  Short Term Goals: Week 2: SLP Short Term Goal 1 (Week 2): Patient will initiiate 20 swallows per session with contextual and Max multi-modal clinician cues. SLP Short Term Goal 1 - Progress (Week 2): Met SLP Short Term Goal 2 (Week 2): Patient will follow 1-step directions with contextual and Max multi-modal clinician cues. SLP Short Term Goal 2 - Progress (Week 2): Met SLP Short Term Goal 3 (Week 2): Patient will identify object from a field of 2 with Mod multi-modal cues. SLP Short Term Goal 3 - Progress (Week 2): Progressing toward goal SLP Short Term Goal 4 (Week 2): Patient will approximate speech sounds during automatic speech tasks with Max assist multi-modal cues. SLP Short Term Goal 4 - Progress (Week 2): Met    New Short Term Goals: Week 3: SLP Short Term Goal 1 (Week 3): Patient will identify object from a field of 2 with Mod multi-modal cues. SLP Short Term Goal 2 (Week 3): Patient will approximate speech sounds during automatic speech tasks with Mod assist multi-modal cues. SLP Short Term Goal 3 (Week 3): Patient will consume trilas of PO with SLP without need for oral suctioning and Min vebral cues for use of swallow strategies SLP Short Term Goal 4 (Week 3): Patient will copy CVC word with Max assist multi-modal cues to identify moments of perseveration.  Weekly Progress Updates: Patient has made gains during this reporting period in ability to transit boluses, approximations of automatic speech sequences and has met 3 of 4 short term goals.  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  Additionally, patient's  aphasia and apraxia continue to impact her ability to phonate and communicate basic wants and needs.  Patient and family education needs to be completed. As a result, patient would benefit from continued skilled SLP intervention to maximize diet safety and overall functional communication in order to maximize her functional independence prior to discharge home with 24/7 family assist.    Intensity: Minumum of 1-2 x/day, 30 to 90 minutes Frequency: 5 out of 7 days Frequency due to:   Duration/Length of Stay: 4/15 Treatment/Interventions: Cognitive remediation/compensation;Cueing hierarchy;Dysphagia/aspiration precaution training;Environmental controls;Functional tasks;Internal/external aids;Multimodal communication approach;Patient/family education;Oral motor exercises;Speech/Language facilitation;Therapeutic Activities   Daily Session  Skilled Therapeutic Interventions: Skilled treatment session focused on addressing dysphagia, cognitive-linguistic goals and family education.  SLP facilitated session with set-up of Dys.1 textures and thin liquids via cup.  Patient consumed trials with multiple swallows, followed by suctioning due to minimal oral residue that patient was unable to transit x1 with thin liquids.  Patient consumed 2oz. puree and 2oz. Sprite Zero during session with hard reflexive cough x1 due to large cup sip, resulting in coordination difficulty and suspected aspiration.  Hard reflexive cough appeared to clear suspected aspirates.  Patient continues to demonstrate voicing intermittently in agreement throughout session.  Patient accurately responded to yes/no questions with 70% accuracy.  Patient also accurately identified objects from a field of two with Mod clinician cues for 70% accuracy.  Daughter observed and asked appropriate questions regarding dysphagia.  Recommend next family education session focus on her leading home receptive therapy tasks.          FIM:  Comprehension Comprehension Mode: Auditory Comprehension: 2-Understands basic 25 - 49% of the time/requires cueing 51 - 75% of the time Expression Expression Mode: Verbal Expression: 1-Expresses basis less than 25% of the time/requires cueing greater than 75% of the time. Social Interaction Social Interaction: 2-Interacts appropriately 25 - 49% of time - Needs frequent redirection. Problem Solving Problem Solving: 3-Solves basic 50 - 74% of the time/requires cueing 25 - 49% of the time Memory Memory: 3-Recognizes or recalls 50 - 74% of the time/requires cueing 25 - 49% of the time FIM - Eating Eating Activity: 4: Help with picking up utensils;4: Help with managing cup/glass;4: Helper checks for pocketed food (with trials ) General  Amount of Missed SLP Time (min): 5 Minutes Pain Pain Assessment Pain Assessment: No/denies pain  Therapy/Group: Individual Therapy  Carmelia Roller., CCC-SLP [place] 06/27/2013, 4:33 PM

## 2013-06-27 NOTE — Progress Notes (Signed)
Occupational Therapy Session Note  Patient Details  Name: [name] MRN: [id number] Date of Birth: Apr 07, 1960  Today's Date: 06/27/2013 Time: 1030-1130 and 7829-5621 Time Calculation (min): 60 min and 30 min  Short Term Goals: Week 4:  OT Short Term Goal 1 (Week 4): LTGs = STGs due to remaining LOS  Skilled Therapeutic Interventions/Progress Updates:    1) Engaged in ADL retraining with focus hands on family education with daughter.  Completed bathing and dressing in ADL apt with focus on tub/shower transfers using tub bench.  Demonstrated proper technique with stand pivot transfer from w/c into shower and had daughter return demonstrate - therapist still providing verbal cues, daughter will require additional hands on practice.  Pt completed bathing at sit > stand level in shower with assist to cross Rt leg over LLE to wash Rt foot and then stabilizing in standing while therapist washed buttocks.  Pt with difficulty with transferring out of tub, with multiple attempts to stand and step out.  Required max verbal and tactile cues to facilitate multiple scoots and then stand pivot to w/c.  While dressing pt able to convey need to toilet.  Performed stand pivot transfer to toilet with use of grab bar on Lt, where pt had continent, formed BM.  Pt required assist for hygiene, but was able to pull pants over hips this session.  Educated daughter on hemi-dressing technique and setup to increase independence.  2) Engaged in hands on education with daughter regarding toilet transfers and scooting.  Pt in bed upon arrival, performed stand pivot transfer to w/c with min assist. In therapy gym completed multiple scoots along treatment mat as pt had demonstrated difficulty with scooting during AM session.  Pt with improved lateral scoots this session.  Followed by hands on transfer training with daughter to/from [facility] 3 times.  Educated daughter on monitoring RLE placement prior to transfers to increase WB  and safe positioning.  Pt progressed from mod to min assist with transfers with daughter.  Daughter with picture of bathroom and one of entry to home and asking questions about a home evaluation.  Encouraged daughter to measure doorways and take more pictures of bathroom and bedroom setup to see if w/c accessible.    Therapy Documentation Precautions:  Precautions Precautions: Fall Precaution Comments: right hemiperesis, NPO and maintain HOB >30 deg; MONITOR VITALS; Alert RN for BP >180/>105 and hold activity for BP 220/120, monitor for signs of aspiration or abdominal pain and distention Restrictions Weight Bearing Restrictions: No General:   Vital Signs: Therapy Vitals Pulse Rate: 91 BP: 151/91 mmHg Patient Position, if appropriate: Sitting Oxygen Therapy SpO2: 94 % O2 Device: None (Room air) Pain: Pain Assessment Pain Assessment: No/denies pain Pain Score: 0-No pain  See FIM for current functional status  Therapy/Group: Individual Therapy  Holli Humbles Kessler [place] 06/27/2013, 12:14 PM

## 2013-06-27 NOTE — Progress Notes (Signed)
Subjective/Complaints: 53 y.o. female with history of HTN, DM, bipolar disorder, who was admitted on 05/27/13 with right facial droop and aphasia. 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 2 D echo with EF 55-60%. Carotid dopplers without significant ICA stenosis. 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 Patient with progressive neurologic worsening on 05/29/13 pm with RUE >RLE weakness. Started on hypertonic fluids and transferred to ICU for closer monitoring. Follow up CCT without significant changes. Patient with decrease in LOC with worsening of symptoms  No verbal output Smiling  Review of Systems - Negative except aphasic not able to consistently do Y/N Objective: Vital Signs: Blood pressure 153/82, pulse 93, temperature 98.3 F (36.8 C), temperature source Oral, resp. rate 18, weight 72.4 kg (159 lb 9.8 oz), SpO2 98.00%. No results found. Results for orders placed during the hospital encounter of 06/04/13 (from the past 72 hour(s))  GLUCOSE, CAPILLARY     Status: Abnormal   Collection Time    06/24/13  7:44 AM      Result Value Ref Range   Glucose-Capillary 109 (*) 70 - 99 mg/dL   Comment 1 Notify RN    GLUCOSE, CAPILLARY     Status: Abnormal   Collection Time    06/24/13 12:04 PM      Result Value Ref Range   Glucose-Capillary 170 (*) 70 - 99 mg/dL   Comment 1 Notify RN    GLUCOSE, CAPILLARY     Status: Abnormal   Collection Time    06/24/13  4:40 PM      Result Value Ref Range   Glucose-Capillary 114 (*) 70 - 99 mg/dL   Comment 1 Notify RN    GLUCOSE, CAPILLARY     Status: Abnormal   Collection  Time    06/24/13  9:16 PM      Result Value Ref Range   Glucose-Capillary 110 (*) 70 - 99 mg/dL  BASIC METABOLIC PANEL     Status: Abnormal   Collection Time    06/25/13  7:01 AM      Result Value Ref Range   Sodium 142  137 - 147 mEq/L   Potassium 4.0  3.7 - 5.3 mEq/L   Chloride 102  96 - 112 mEq/L   CO2 27  19 - 32 mEq/L   Glucose, Bld 112 (*) 70 - 99 mg/dL   BUN 17  6 - 23 mg/dL   Creatinine, Ser 0.73  0.50 - 1.10 mg/dL   Calcium 9.4  8.4 - 10.5 mg/dL   GFR calc non Af Amer >90  >90 mL/min   GFR calc Af Amer >90  >90 mL/min   Comment: (NOTE)     The eGFR has been calculated using the CKD EPI equation.     This calculation has not been validated in all clinical situations.     eGFR's persistently <90 mL/min signify possible Chronic Kidney     Disease.  CBC     Status: None   Collection Time    06/25/13  7:01 AM      Result Value Ref Range   WBC 8.0  4.0 - 10.5 K/uL   RBC 4.08  3.87 - 5.11 MIL/uL   Hemoglobin 12.3  12.0 - 15.0 g/dL   HCT 36.7  36.0 - 46.0 %   MCV 90.0  78.0 - 100.0 fL   MCH 30.1  26.0 - 34.0 pg   MCHC 33.5  30.0 - 36.0 g/dL   RDW 12.1  11.5 - 15.5 %   Platelets 337  150 - 400 K/uL  GLUCOSE, CAPILLARY     Status: Abnormal   Collection Time    06/25/13  7:45 AM      Result Value Ref Range   Glucose-Capillary 112 (*) 70 - 99 mg/dL   Comment 1 Notify RN    GLUCOSE, CAPILLARY     Status: Abnormal   Collection Time    06/25/13 11:40 AM      Result Value Ref Range   Glucose-Capillary 147 (*) 70 - 99 mg/dL  GLUCOSE, CAPILLARY     Status: Abnormal   Collection Time    06/25/13  4:44 PM      Result Value Ref Range   Glucose-Capillary 60 (*) 70 - 99 mg/dL   Comment 1 Notify RN    GLUCOSE, CAPILLARY     Status: None   Collection Time    06/25/13  5:15 PM      Result Value Ref Range   Glucose-Capillary 78  70 - 99 mg/dL  GLUCOSE, CAPILLARY     Status: Abnormal   Collection Time    06/25/13  7:53 PM      Result Value Ref Range   Glucose-Capillary 109  (*) 70 - 99 mg/dL  GLUCOSE, CAPILLARY     Status: Abnormal   Collection Time    06/26/13  7:45 AM      Result Value Ref Range   Glucose-Capillary 116 (*) 70 - 99 mg/dL   Comment 1 Notify RN    GLUCOSE, CAPILLARY     Status: Abnormal   Collection Time    06/26/13 11:57 AM      Result Value Ref Range   Glucose-Capillary 147 (*) 70 - 99 mg/dL   Comment 1 Notify RN    GLUCOSE, CAPILLARY     Status: Abnormal   Collection Time    06/26/13  4:47 PM      Result Value Ref Range   Glucose-Capillary 103 (*) 70 - 99 mg/dL   Comment 1 Notify RN    GLUCOSE, CAPILLARY     Status: Abnormal   Collection Time    06/26/13  8:25 PM      Result Value Ref Range   Glucose-Capillary 147 (*) 70 - 99 mg/dL   Comment 1 Notify RN       HEENT: normal   Cardio: RRR and no murmur Resp: CTA B/L and unlabored GI: BS positive and non distended, G tube intact. Bumper ok. Generally tender at ostomy No abnl drainage Extremity:  Pulses positive and No Edema Skin:   Intact Neuro: alert ,smiling, Cranial Nerve Abnormalities R central 7, Abnormal Sensory  Abnormal Motor 0/5 RUE and RLE except 3- knee flexion and  ext, normal on Left and Aphasic. 1- 1+ tone RE including bicep,wrist/fingers, hamstring/gastroc  MAS 2-3 R FF, Thumb flexors, WF Musc/Skel:  Other no pain with R shoulder ROM Gen NAD. Very alert   Assessment/Plan: 1. Functional deficits secondary to Left MCA infarct which require 3+ hours per day of interdisciplinary therapy in a comprehensive inpatient rehab setting. Physiatrist is providing close team supervision and 24 hour management of active medical  problems listed below. Physiatrist and rehab team continue to assess barriers to discharge/monitor patient progress toward functional and medical goals.   FIM: FIM - Bathing Bathing Steps Patient Completed: Chest;Abdomen;Front perineal area;Right upper leg;Left upper leg;Buttocks;Right lower leg (including foot);Left lower leg (including  foot) Bathing: 4: Min-Patient completes 8-9 27f10 parts or 75+ percent  FIM - Upper Body Dressing/Undressing Upper body dressing/undressing steps patient completed: Thread/unthread right bra strap;Thread/unthread left bra strap;Thread/unthread left sleeve of pullover shirt/dress;Put head through opening of pull over shirt/dress;Pull shirt over trunk Upper body dressing/undressing: 3: Mod-Patient completed 50-74% of tasks FIM - Lower Body Dressing/Undressing Lower body dressing/undressing steps patient completed: Thread/unthread left underwear leg;Pull underwear up/down;Thread/unthread right pants leg;Thread/unthread left pants leg;Pull pants up/down Lower body dressing/undressing: 3: Mod-Patient completed 50-74% of tasks  FIM - Toileting Toileting steps completed by patient: Adjust clothing prior to toileting;Adjust clothing after toileting Toileting Assistive Devices: Grab bar or rail for support Toileting: 3: Mod-Patient completed 2 of 3 steps  FIM - TRadio producerDevices: Bedside commode Toilet Transfers: 4-From toilet/BSC: Min A (steadying Pt. > 75%);4-To toilet/BSC: Min A (steadying Pt. > 75%)  FIM - Bed/Chair Transfer Bed/Chair Transfer Assistive Devices: Arm rests Bed/Chair Transfer: 4: Supine > Sit: Min A (steadying Pt. > 75%/lift 1 leg);4: Sit > Supine: Min A (steadying pt. > 75%/lift 1 leg);4: Bed > Chair or W/C: Min A (steadying Pt. > 75%);4: Chair or W/C > Bed: Min A (steadying Pt. > 75%)  FIM - Locomotion: Wheelchair Distance: 75 Locomotion: Wheelchair: 2: Travels 50 - 149 ft with minimal assistance (Pt.>75%) FIM - Locomotion: Ambulation Locomotion: Ambulation Assistive Devices: WMuseum/gallery curatorAmbulation/Gait Assistance: 3: Mod assist Locomotion: Ambulation: 1: Travels less than 50 ft with moderate assistance (Pt: 50 - 74%)  Comprehension Comprehension Mode: Auditory Comprehension: 2-Understands basic 25 - 49% of the time/requires cueing 51 -  75% of the time  Expression Expression Mode: Verbal Expression: 1-Expresses basis less than 25% of the time/requires cueing greater than 75% of the time.  Social Interaction Social Interaction: 2-Interacts appropriately 25 - 49% of time - Needs frequent redirection.  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 DVT Prophylaxis/Anticoagulation: Pharmaceutical: Lovenox  2. Pain Management: N/A  3. Bipolar disorder/Mood: No signs of distress reported but patient with poor awareness of deficits. Monitor mood for now. LCSW to follow with patient and family.  4. Neuropsych: This patient is not capable of making decisions on her own behalf.  5. DM type 2: Will monitor with bid checks. Continue to titrate insulin as needed---will change to coordinate with bolus feeding schedule. Use SSI for elevated BS.  6. HTN: adjust for better control avoid hypoperfusion/neuro worsening   Labetalol at max, ACE I not effective by itself, will add HCTZ 7. [facility]: [facility]. Not on medications as this time--has been on respiridone in the past and has history of non-compliance per records  8. Dysphagia: s/p G-tube   -on bolus feeds   9. 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

## 2013-06-27 NOTE — Progress Notes (Signed)
NUTRITION FOLLOW UP  Intervention:   1. Enteral nutrition; Continue Glucerna 1.2 350 mL 4 times daily.  This meets intake goals of >/=90% of estimated needs.  Continue current bolus regimen and monitor weights for adequacy.   NUTRITION DIAGNOSIS:  Inadequate oral intake related to inability to eat, dysphagia as evidenced by SLP assessment.   Monitor:  1. Enteral nutrition; initiation with tolerance. Pt to meet >/=90% estimated needs with nutrition support. Met.  2. Wt/wt change; monitor trends. Ongoing.   Assessment:   53 y.o. female with history of HTN, DM, bipolar disorder, who was admitted on 05/27/13 with right facial droop and aphasia. 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   Pt continues to tolerate regimen well. Current regimen of 350 mL 4 times daily provides 1680 kcal, 84g protein, and 1135 mL free water which meets 93% estimated energy needs.  Wt somewhat variable, but overall stable.   Height: Ht Readings from Last 1 Encounters:  05/27/13 5' 6"  (1.676 m)    Weight Status:   Wt Readings from Last 1 Encounters:  06/27/13 159 lb 9.8 oz (72.4 kg)   Re-estimated needs:  Kcal: 1800-1980 Protein: 70-85g Fluid: >1.8 L/day  Skin: intact  Diet Order: NPO   Intake/Output Summary (Last 24 hours) at 06/27/13 0900 Last data filed at 06/27/13 0847  Gross per 24 hour  Intake    550 ml  Output      0 ml  Net    550 ml    Last BM: 4/9  Labs:   Recent Labs Lab 06/25/13 0701  NA 142  K 4.0  CL 102  CO2 27  BUN 17  CREATININE 0.73  CALCIUM 9.4  GLUCOSE 112*    CBG (last 3)   Recent Labs  06/26/13 1647 06/26/13 2025 06/27/13 0725  GLUCAP 103*  147* 97    Scheduled Meds: . antiseptic oral rinse  15 mL Mouth Rinse q12n4p  . aspirin  325 mg Per Tube Daily  . baclofen  5 mg Oral BID  . chlorhexidine  15 mL Mouth Rinse BID  . enoxaparin (LOVENOX) injection  30 mg Subcutaneous Q24H  . feeding supplement (GLUCERNA 1.2 CAL)  350 mL Per Tube QID  . free water  200 mL Per Tube 5 X Daily  . hydrochlorothiazide  6.25 mg Oral Daily  . insulin aspart  0-15 Units Subcutaneous TID AC & HS  . insulin glargine  20 Units Subcutaneous BID  . labetalol  300 mg Per NG tube TID  . mupirocin ointment   Nasal BID  . nystatin  5 mL Oral QID  . ramipril  5 mg Oral Daily  . simvastatin  20 mg Per Tube q1800    Continuous Infusions:    Brynda Greathouse, MS RD LDN Clinical Inpatient Dietitian Pager: [phone] Weekend/After hours pager: [phone]

## 2013-06-28 ENCOUNTER — Inpatient Hospital Stay (HOSPITAL_COMMUNITY): Payer: Medicaid Other | Admitting: Physical Therapy

## 2013-06-28 ENCOUNTER — Inpatient Hospital Stay (HOSPITAL_COMMUNITY): Payer: Medicaid Other | Admitting: Speech Pathology

## 2013-06-28 DIAGNOSIS — I10 Essential (primary) hypertension: Secondary | ICD-10-CM

## 2013-06-28 LAB — GLUCOSE, CAPILLARY
GLUCOSE-CAPILLARY: 145 mg/dL — AB (ref 70–99)
Glucose-Capillary: 122 mg/dL — ABNORMAL HIGH (ref 70–99)
Glucose-Capillary: 119 mg/dL — ABNORMAL HIGH (ref 70–99)
Glucose-Capillary: 123 mg/dL — ABNORMAL HIGH (ref 70–99)

## 2013-06-28 LAB — URINE CULTURE: Colony Count: 3000

## 2013-06-28 NOTE — Progress Notes (Signed)
Physical Therapy Session Note  Patient Details  Name: [name] MRN: [id number] Date of Birth: [birth year]  Today's Date: 06/28/2013 Time: 5430-1484 Time Calculation (min): 45 min  Short Term Goals: Week 1:  PT Short Term Goal 1 (Week 1): Pt will perform bed mobility to L and R with mod A and 75% cues for initiation and sequencing PT Short Term Goal 1 - Progress (Week 1): Met PT Short Term Goal 2 (Week 1): Pt will perform bed <> w/c transfers with mod A and 75% cues for initiation and sequencing PT Short Term Goal 2 - Progress (Week 1): Met PT Short Term Goal 3 (Week 1): Pt will perform w/c mobility x 50' on unit with L hemi technique and mod A with 75% cues for sequencing PT Short Term Goal 3 - Progress (Week 1): Partly met PT Short Term Goal 4 (Week 1): Pt will perform gait x 25' with LRAD and mod A  PT Short Term Goal 4 - Progress (Week 1): Partly met PT Short Term Goal 5 (Week 1): Pt will negotiate 3 stairs with one rail and max A and 75% cues for sequencing PT Short Term Goal 5 - Progress (Week 1): Partly met  Therapy Documentation Precautions:  Precautions Precautions: Fall Precaution Comments: right hemiperesis, NPO and maintain HOB >30 deg; MONITOR VITALS; Alert RN for BP >180/>105 and hold activity for BP 220/120, monitor for signs of aspiration or abdominal pain and distention Restrictions Weight Bearing Restrictions: No Vital Signs: Therapy Vitals Temp: 98.3 F (36.8 C) Temp src: Oral Pulse Rate: 90 Resp: 15 BP: 165/104 mmHg Patient Position, if appropriate: Lying Oxygen Therapy SpO2: 96 % O2 Device: None (Room air) Pain: Pain Assessment Pain Assessment: No/denies pain  Therapeutic Activity:(30')  Bed Mobility S/min-assist supine to sit via L sidelying, and then scooting forward to EOB with Mod-assist. Transfer bed<->BSC with min/Mod-assist, Transfer sit<->stand with min/Mod-assist using Hemiwalker on L for balance in standing. Gait Training:(15') 1 x  10', 2 x 40' using hemiwalker on L and with min-assist with occasional verbal cues for sequencing. Seated rests in between and family member assisting by pushing chair behind patient.    Therapy/Group: Individual Therapy  Clearence Ped 06/28/2013, 3:59 PM

## 2013-06-28 NOTE — Progress Notes (Signed)
Speech Language Pathology Daily Session Note  Patient Details  Name: [name] MRN: [id number] Date of Birth: 08-04-1960  Today's Date: 06/28/2013 Time: 6606-3016 Time Calculation (min): 25 min  Short Term Goals: Week 3: SLP Short Term Goal 1 (Week 3): Patient will identify object from a field of 2 with Mod multi-modal cues. SLP Short Term Goal 2 (Week 3): Patient will approximate speech sounds during automatic speech tasks with Mod assist multi-modal cues. SLP Short Term Goal 3 (Week 3): Patient will consume trilas of PO with SLP without need for oral suctioning and Min vebral cues for use of swallow strategies SLP Short Term Goal 4 (Week 3): Patient will copy CVC word with Max assist multi-modal cues to identify moments of perseveration.  Skilled Therapeutic Interventions: Skilled treatment session focused on speech and language goals. Upon entering, RN had just completed administering the pt her tube feeding. SLP facilitated session by providing total A multimodal cues for automatic verbal task of counting. Pt attempted approximations with oral musculature but was unable to vocalize. Pt identified functional items from field of two with 60% accuracy with Mod clinician cues. Pt with intermittent vocalizations with laughing and when "in agreement."  Pt also utilized hand gestures to indicate to clinician to leave the door open. Family was not present during session.  Continue with current plan of care.    FIM:  Comprehension Comprehension Mode: Auditory Comprehension: 2-Understands basic 25 - 49% of the time/requires cueing 51 - 75% of the time Expression Expression Mode: Nonverbal Expression: 1-Expresses basis less than 25% of the time/requires cueing greater than 75% of the time. Social Interaction Social Interaction: 2-Interacts appropriately 25 - 49% of time - Needs frequent redirection. Problem Solving Problem Solving: 2-Solves basic 25 - 49% of the time - needs direction  more than half the time to initiate, plan or complete simple activities Memory Memory: 2-Recognizes or recalls 25 - 49% of the time/requires cueing 51 - 75% of the time FIM - Eating Eating Activity: 1: Helper performs IV, parenteral, or tube feeding  Pain Pain Assessment Pain Assessment: No/denies pain  Therapy/Group: Individual Therapy  Buzzy Han 06/28/2013, 3:06 PM

## 2013-06-28 NOTE — Progress Notes (Signed)
  Subjective: No new complaints. No new problems. Slept well. Feeling OK.  Objective: Vital signs in last 24 hours: Temp:  [98.1 F (36.7 C)-98.2 F (36.8 C)] 98.2 F (36.8 C) (04/11 0433) Pulse Rate:  [79-96] 96 (04/11 0433) Resp:  [20] 20 (04/11 0433) BP: (165-180)/(94-109) 177/101 mmHg (04/11 0849) SpO2:  [95 %-96 %] 96 % (04/11 0433) Weight:  [162 lb 4.1 oz (73.6 kg)] 162 lb 4.1 oz (73.6 kg) (04/11 0433) Weight change: 2 lb 10.3 oz (1.2 kg) Last BM Date: 06/27/13  Intake/Output from previous day: 04/10 0701 - 04/11 0700 In: 550  Out: -  Last cbgs: CBG (last 3)   Recent Labs  06/27/13 1601 06/27/13 2013 06/28/13 0731  GLUCAP 141* 133* 122*     Physical Exam General: No apparent distress  Looks better HEENT: not dry Lungs: Normal effort. Lungs clear to auscultation, no crackles or wheezes. Cardiovascular: Regular rate and rhythm, no edema Abdomen: S/NT/ND; BS(+) NG tube Musculoskeletal:  unchanged Neurological: No new neurological deficits Wounds: N/A    Skin: clear  Aging changes Mental state: Alert, aphasic, cooperative    Lab Results: BMET    Component Value Date/Time   NA 142 06/25/2013 0701   K 4.0 06/25/2013 0701   CL 102 06/25/2013 0701   CO2 27 06/25/2013 0701   GLUCOSE 112* 06/25/2013 0701   BUN 17 06/25/2013 0701   CREATININE 0.73 06/25/2013 0701   CALCIUM 9.4 06/25/2013 0701   GFRNONAA >90 06/25/2013 0701   GFRAA >90 06/25/2013 0701   CBC    Component Value Date/Time   WBC 8.0 06/25/2013 0701   RBC 4.08 06/25/2013 0701   HGB 12.3 06/25/2013 0701   HCT 36.7 06/25/2013 0701   PLT 337 06/25/2013 0701   MCV 90.0 06/25/2013 0701   MCH 30.1 06/25/2013 0701   MCHC 33.5 06/25/2013 0701   RDW 12.1 06/25/2013 0701   LYMPHSABS 2.7 06/05/2013 0600   MONOABS 0.9 06/05/2013 0600   EOSABS 0.3 06/05/2013 0600   BASOSABS 0.0 06/05/2013 0600    Studies/Results: No results found.  Medications: I have reviewed the patient's current medications.  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 DVT Prophylaxis/Anticoagulation: Pharmaceutical: Lovenox  2. Pain Management: N/A  3. Bipolar disorder/Mood: No signs of distress reported but patient with poor awareness of deficits. Monitor mood for now. LCSW to follow with patient and family.  4. Neuropsych: This patient is not capable of making decisions on her own behalf.  5. DM type 2: Will monitor with bid checks. Continue to titrate insulin as needed---will change to coordinate with bolus feeding schedule. Use SSI for elevated BS.  6. HTN: adjust for better control avoid hypoperfusion/neuro worsening  Labetalol at max, ACE I not effective by itself, will add HCTZ  7. H/o paranoid schizophrenia: Has been to [facility] in the past due to decompensation. Not on medications as this time--has been on respiridone in the past and has history of non-compliance per records  8. Dysphagia: s/p G-tube  -on bolus feeds  9. 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

## 2013-06-29 LAB — GLUCOSE, CAPILLARY
GLUCOSE-CAPILLARY: 148 mg/dL — AB (ref 70–99)
GLUCOSE-CAPILLARY: 84 mg/dL (ref 70–99)
Glucose-Capillary: 116 mg/dL — ABNORMAL HIGH (ref 70–99)
Glucose-Capillary: 118 mg/dL — ABNORMAL HIGH (ref 70–99)

## 2013-06-29 NOTE — Progress Notes (Signed)
  Subjective: No new complaints. No new problems. Slept well. Objective: Vital signs in last 24 hours: Temp:  [98.3 F (36.8 C)-98.5 F (36.9 C)] 98.5 F (36.9 C) (04/12 0625) Pulse Rate:  [80-99] 99 (04/12 0625) Resp:  [16-18] 16 (04/12 0625) BP: (158-186)/(96-102) 164/100 mmHg (04/12 0625) SpO2:  [96 %-98 %] 98 % (04/12 0625) Weight:  [161 lb 13.1 oz (73.4 kg)] 161 lb 13.1 oz (73.4 kg) (04/12 0625) Weight change: -7.1 oz (-0.2 kg) Last BM Date: 06/27/13  Intake/Output from previous day: 04/11 0701 - 04/12 0700 In: 2200  Out: -  Last cbgs: CBG (last 3)   Recent Labs  06/28/13 1701 06/28/13 2010 06/29/13 0756  GLUCAP 119* 123* 116*     Physical Exam General: No apparent distress  NAD HEENT: not dry Lungs: Normal effort. Lungs clear to auscultation, no crackles or wheezes. Cardiovascular: Regular rate and rhythm, no edema Abdomen: S/NT/ND; BS(+) NG tube Musculoskeletal:  unchanged Neurological: No new neurological deficits Wounds: N/A    Skin: clear  Aging changes Mental state: Alert, aphasic, cooperative    Lab Results: BMET    Component Value Date/Time   NA 142 06/25/2013 0701   K 4.0 06/25/2013 0701   CL 102 06/25/2013 0701   CO2 27 06/25/2013 0701   GLUCOSE 112* 06/25/2013 0701   BUN 17 06/25/2013 0701   CREATININE 0.73 06/25/2013 0701   CALCIUM 9.4 06/25/2013 0701   GFRNONAA >90 06/25/2013 0701   GFRAA >90 06/25/2013 0701   CBC    Component Value Date/Time   WBC 8.0 06/25/2013 0701   RBC 4.08 06/25/2013 0701   HGB 12.3 06/25/2013 0701   HCT 36.7 06/25/2013 0701   PLT 337 06/25/2013 0701   MCV 90.0 06/25/2013 0701   MCH 30.1 06/25/2013 0701   MCHC 33.5 06/25/2013 0701   RDW 12.1 06/25/2013 0701   LYMPHSABS 2.7 06/05/2013 0600   MONOABS 0.9 06/05/2013 0600   EOSABS 0.3 06/05/2013 0600   BASOSABS 0.0 06/05/2013 0600    Studies/Results: No results found.  Medications: I have reviewed the patient's current medications.  Assessment/Plan:  Embolic cva, dense Left MCA probable  PRES  1. DVT Prophylaxis/Anticoagulation: Pharmaceutical: Lovenox  2. Pain Management: N/A  3. Bipolar disorder/Mood: No signs of distress reported but patient with poor awareness of deficits. Monitor mood for now. LCSW to follow with patient and family.  4. Neuropsych: This patient is not capable of making decisions on her own behalf.  5. DM type 2: Will monitor with bid checks. Continue to titrate insulin as needed---will change to coordinate with bolus feeding schedule. Use SSI for elevated BS.  6. HTN: adjust for better control avoid hypoperfusion/neuro worsening  Labetalol at max, ACE I not effective by itself, will add HCTZ  7. H/o paranoid schizophrenia: Has been to [facility] in the past due to decompensation. Not on medications as this time--has been on respiridone in the past and has history of non-compliance per records  8. Dysphagia: s/p G-tube  -on bolus feeds  9. 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

## 2013-06-30 ENCOUNTER — Inpatient Hospital Stay (HOSPITAL_COMMUNITY): Payer: Medicaid Other | Admitting: Occupational Therapy

## 2013-06-30 ENCOUNTER — Inpatient Hospital Stay (HOSPITAL_COMMUNITY): Payer: Medicaid Other | Admitting: Physical Therapy

## 2013-06-30 ENCOUNTER — Inpatient Hospital Stay (HOSPITAL_COMMUNITY): Payer: Medicaid Other | Admitting: Speech Pathology

## 2013-06-30 DIAGNOSIS — F319 Bipolar disorder, unspecified: Secondary | ICD-10-CM

## 2013-06-30 DIAGNOSIS — E119 Type 2 diabetes mellitus without complications: Secondary | ICD-10-CM

## 2013-06-30 DIAGNOSIS — I10 Essential (primary) hypertension: Secondary | ICD-10-CM

## 2013-06-30 DIAGNOSIS — I6340 Cerebral infarction due to embolism of unspecified cerebral artery: Secondary | ICD-10-CM

## 2013-06-30 LAB — GLUCOSE, CAPILLARY
GLUCOSE-CAPILLARY: 131 mg/dL — AB (ref 70–99)
Glucose-Capillary: 181 mg/dL — ABNORMAL HIGH (ref 70–99)
Glucose-Capillary: 150 mg/dL — ABNORMAL HIGH (ref 70–99)
Glucose-Capillary: 151 mg/dL — ABNORMAL HIGH (ref 70–99)

## 2013-06-30 NOTE — Progress Notes (Signed)
Occupational Therapy Session Note  Patient Details  Name: [name] MRN: [id number] Date of Birth: [birth year]  Today's Date: 06/30/2013 Time: 1000-1057 and 0177-9390 Time Calculation (min): 57 min and 30 min  Short Term Goals: Week 4:  OT Short Term Goal 1 (Week 4): LTGs = STGs due to remaining LOS  Skilled Therapeutic Interventions/Progress Updates:    1) Engaged in ADL retraining with focus on transfers, standing balance, and hemi dressing technique.  Pt received seated EOB with concerned look on face, upon questioning realized pt needed to toilet.  Performed stand pivot transfer bed > BSC with min assist where pt was continent of bladder (however had already been incontinent of bowel and bladder in brief).  Utilized hemi-walker to provide UE support in standing while therapist assisted with hygiene post BM.  Bathing completed at sink with assist to cross RLE over Lt knee to wash and when donning brief and pants.  Pt requires setup assist and tactile cues with LB dressing.  Stand pivot transfer to recliner at end of session min assist.  Pt again appeared as if she wanted to say something however after multiple questions with all no responses and attempts to have pt write it with response of "CUP" and "CUPHUR" with pt shaking no when therapist read both responses, pt just waved it off.  Followed up with SLP who reports multiple yes/no questions with pt only responding approx 60%-70% of time and trials with communication board with only 2-3 pictures, but may revisit.    2) Engaged in [place] re-ed and transfers.  Pt asleep in recliner but easily aroused.  Engaged in [place] PROM with focus on shoulder mobility, pt with increased tone with external rotation and noted some tone in wrist and fingers.  Pt continues with no active movement.  Appeared anxious, questioned if needing to toilet with pt nodding yes.  Performed stand pivot transfer to [facility] with pt able to adjust clothing prior to toileting.   Continent of bladder on BSC.  Pt required assist with hygiene due to smear of BM.  Stand pivot to w/c in preparation for SLP session.  Therapy Documentation Precautions:  Precautions Precautions: Fall Precaution Comments: right hemiperesis, NPO and maintain HOB >30 deg; MONITOR VITALS; Alert RN for BP >180/>105 and hold activity for BP 220/120, monitor for signs of aspiration or abdominal pain and distention Restrictions Weight Bearing Restrictions: No Pain: Pain Assessment Pain Assessment: No/denies pain  See FIM for current functional status  Therapy/Group: Individual Therapy  Kerrie Buffalo 06/30/2013, 11:35 AM

## 2013-06-30 NOTE — Progress Notes (Addendum)
Subjective/Complaints: 53 y.o. female with history of HTN, DM, bipolar disorder, who was admitted on 05/27/13 with right facial droop and aphasia. 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 2 D echo with EF 55-60%. Carotid dopplers without significant ICA stenosis. 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 Patient with progressive neurologic worsening on 05/29/13 pm with RUE >RLE weakness. Started on hypertonic fluids and transferred to ICU for closer monitoring. Follow up CCT without significant changes. Patient with decrease in LOC with worsening of symptoms  No verbal output Smiling Not accurate with head nods  Review of Systems - Negative except aphasic not able to consistently do Y/N Objective: Vital Signs: Blood pressure 175/109, pulse 95, temperature 98.6 F (37 C), temperature source Oral, resp. rate 18, weight 71.9 kg (158 lb 8.2 oz), SpO2 96.00%. No results found. Results for orders placed during the hospital encounter of 06/04/13 (from the past 72 hour(s))  GLUCOSE, CAPILLARY     Status: None   Collection Time    06/27/13  7:25 AM      Result Value Ref Range   Glucose-Capillary 97  70 - 99 mg/dL   Comment 1 Notify RN    URINALYSIS, ROUTINE W REFLEX MICROSCOPIC     Status: Abnormal   Collection Time    06/27/13  9:49 AM      Result Value Ref Range   Color, Urine YELLOW  YELLOW   APPearance HAZY (*) CLEAR   Specific Gravity, Urine 1.019  1.005 - 1.030   pH 6.5  5.0 - 8.0   Glucose, UA NEGATIVE  NEGATIVE mg/dL   Hgb urine dipstick NEGATIVE  NEGATIVE   Bilirubin Urine NEGATIVE  NEGATIVE   Ketones, ur  NEGATIVE  NEGATIVE mg/dL   Protein, ur NEGATIVE  NEGATIVE mg/dL   Urobilinogen, UA 1.0  0.0 - 1.0 mg/dL   Nitrite NEGATIVE  NEGATIVE   Leukocytes, UA MODERATE (*) NEGATIVE  URINE CULTURE     Status: None   Collection Time    06/27/13  9:49 AM      Result Value Ref Range   Specimen Description URINE, CLEAN CATCH     Special Requests NONE     Culture  Setup Time       Value: 06/27/2013 10:26     Performed at [facility]       Value: 3,000 COLONIES/ML     Performed at [facility]   Culture       Value: INSIGNIFICANT GROWTH     Performed at [facility]   Report Status 06/28/2013 FINAL    URINE MICROSCOPIC-ADD ON     Status: Abnormal   Collection Time    06/27/13  9:49 AM      Result Value Ref Range   Squamous Epithelial / LPF MANY (*) RARE   WBC, UA 21-50  <3 WBC/hpf   Bacteria, UA MANY (*) RARE  GLUCOSE, CAPILLARY     Status: Abnormal   Collection Time    06/27/13 12:10 PM      Result Value Ref Range   Glucose-Capillary 144 (*) 70 - 99 mg/dL   Comment 1  Notify RN    GLUCOSE, CAPILLARY     Status: Abnormal   Collection Time    06/27/13  4:01 PM      Result Value Ref Range   Glucose-Capillary 141 (*) 70 - 99 mg/dL  GLUCOSE, CAPILLARY     Status: Abnormal   Collection Time    06/27/13  8:13 PM      Result Value Ref Range   Glucose-Capillary 133 (*) 70 - 99 mg/dL  GLUCOSE, CAPILLARY     Status: Abnormal   Collection Time    06/28/13  7:31 AM      Result Value Ref Range   Glucose-Capillary 122 (*) 70 - 99 mg/dL   Comment 1 Notify RN    GLUCOSE, CAPILLARY     Status: Abnormal   Collection Time    06/28/13 11:30 AM      Result Value Ref Range   Glucose-Capillary 145 (*) 70 - 99 mg/dL   Comment 1 Notify RN    GLUCOSE, CAPILLARY     Status: Abnormal   Collection Time    06/28/13  5:01 PM      Result Value Ref Range   Glucose-Capillary 119 (*) 70 - 99 mg/dL   Comment 1 Notify RN    GLUCOSE, CAPILLARY     Status: Abnormal    Collection Time    06/28/13  8:10 PM      Result Value Ref Range   Glucose-Capillary 123 (*) 70 - 99 mg/dL   Comment 1 Notify RN    GLUCOSE, CAPILLARY     Status: Abnormal   Collection Time    06/29/13  7:56 AM      Result Value Ref Range   Glucose-Capillary 116 (*) 70 - 99 mg/dL  GLUCOSE, CAPILLARY     Status: Abnormal   Collection Time    06/29/13 12:02 PM      Result Value Ref Range   Glucose-Capillary 148 (*) 70 - 99 mg/dL  GLUCOSE, CAPILLARY     Status: None   Collection Time    06/29/13  4:59 PM      Result Value Ref Range   Glucose-Capillary 84  70 - 99 mg/dL  GLUCOSE, CAPILLARY     Status: Abnormal   Collection Time    06/29/13  9:00 PM      Result Value Ref Range   Glucose-Capillary 118 (*) 70 - 99 mg/dL   Comment 1 Notify RN       HEENT: normal   Cardio: RRR and no murmur Resp: CTA B/L and unlabored GI: BS positive and non distended, G tube intact. Bumper ok.Non tender at ostomy No abnl drainage Extremity:  Pulses positive and No Edema Skin:   Intact Neuro: alert ,smiling, Cranial Nerve Abnormalities R central 7, Abnormal Sensory  Abnormal Motor 0/5 RUE and RLE except 3- knee flexion and  ext, normal on Left and Aphasic. 1- 1+ tone RE including bicep,wrist/fingers, hamstring/gastroc  MAS 2-3 R FF, Thumb flexors, WF, in WHO Musc/Skel:  Other no pain with R shoulder ROM Gen NAD. Very alert   Assessment/Plan: 1. Functional deficits secondary to Left MCA infarct which require 3+ hours per day of interdisciplinary therapy in a comprehensive inpatient rehab setting. Physiatrist is providing close team supervision and 24 hour management of active medical problems listed below. Physiatrist and rehab team continue to assess barriers to discharge/monitor patient progress toward functional and medical goals.   FIM: FIM - Bathing Bathing Steps Patient Completed: Chest;Right Arm;Abdomen;Front  perineal area;Buttocks;Right upper leg Bathing: 3: Mod-Patient completes 5-7 17f  10 parts or 50-74%  FIM - Upper Body Dressing/Undressing Upper body dressing/undressing steps patient completed: Put head through opening of pull over shirt/dress Upper body dressing/undressing: 3: Mod-Patient completed 50-74% of tasks FIM - Lower Body Dressing/Undressing Lower body dressing/undressing steps patient completed: Thread/unthread right underwear leg;Thread/unthread left underwear leg;Thread/unthread left pants leg Lower body dressing/undressing: 3: Mod-Patient completed 50-74% of tasks  FIM - Toileting Toileting steps completed by patient: Adjust clothing prior to toileting;Adjust clothing after toileting Toileting Assistive Devices: Grab bar or rail for support Toileting: 3: Mod-Patient completed 2 of 3 steps  FIM - Radio producer Devices: Grab bars Toilet Transfers: 4-From toilet/BSC: Min A (steadying Pt. > 75%);4-To toilet/BSC: Min A (steadying Pt. > 75%)  FIM - Bed/Chair Transfer Bed/Chair Transfer Assistive Devices: Arm rests;Bed rails Bed/Chair Transfer: 3: Sit > Supine: Mod A (lifting assist/Pt. 50-74%/lift 2 legs);3: Bed > Chair or W/C: Mod A (lift or lower assist)  FIM - Locomotion: Wheelchair Distance: 75 Locomotion: Wheelchair: 1: Total Assistance/staff pushes wheelchair (Pt<25%) FIM - Locomotion: Ambulation Locomotion: Ambulation Assistive Devices: Lite Gait Ambulation/Gait Assistance: 1: +2 Total assist Locomotion: Ambulation: 1: Two helpers  Comprehension Comprehension Mode: Auditory Comprehension: 2-Understands basic 25 - 49% of the time/requires cueing 51 - 75% of the time  Expression Expression Mode: Nonverbal Expression: 1-Expresses basis less than 25% of the time/requires cueing greater than 75% of the time.  Social Interaction Social Interaction: 2-Interacts appropriately 25 - 49% of time - Needs frequent redirection.  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 DVT Prophylaxis/Anticoagulation: Pharmaceutical: Lovenox  2. Pain Management: N/A  3. Bipolar disorder/Mood: No signs of distress reported but patient with poor awareness of deficits. Monitor mood for now. LCSW to follow with patient and family.  4. Neuropsych: This patient is not capable of making decisions on her own behalf.  5. DM type 2: Will monitor with bid checks. Continue to titrate insulin as needed---will change to coordinate with bolus feeding schedule. Use SSI for elevated BS.  6. HTN: adjust for better control avoid hypoperfusion/neuro worsening   Labetalol at max, ACE I not effective by itself, will add HCTZ 7. H/o paranoid schizophrenia: Has been to [facility] in the past due to decompensation. Not on medications as this time--has been on respiridone in the past and has history of non-compliance per records  8. Dysphagia: s/p G-tube   -on bolus feeds   9. 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

## 2013-06-30 NOTE — Progress Notes (Signed)
Speech Language Pathology Daily Session Note  Patient Details  Name: [name] MRN: [id number] Date of Birth: 1960/04/22  Today's Date: 06/30/2013 Time: 1415-1500 Time Calculation (min): 45 min  Short Term Goals: Week 3: SLP Short Term Goal 1 (Week 3): Patient will identify object from a field of 2 with Mod multi-modal cues. SLP Short Term Goal 2 (Week 3): Patient will approximate speech sounds during automatic speech tasks with Mod assist multi-modal cues. SLP Short Term Goal 3 (Week 3): Patient will consume trilas of PO with SLP without need for oral suctioning and Min vebral cues for use of swallow strategies SLP Short Term Goal 4 (Week 3): Patient will copy CVC word with Max assist multi-modal cues to identify moments of perseveration.  Skilled Therapeutic Interventions: Skilled treatment session focused on addressing dysphagia, speech and language goals.  SLP facilitated session with trials of thin via cup, puree and regular textures.  Patient consumed trials with Min verbal cues and 3-4 swallows due to oral phase impairments.  Patient with cough x1 with large cup sip.  SLP also facilitated session by providing Total assist multimodal cues for automatic verbal task of counting. Patient attempted approximations with oral musculature and voiced in 2/15 opportunities. Patient identified functional items from field of two with 60% accuracy with Mod clinician cues. Continue with current plan of care.   FIM:  Comprehension Comprehension Mode: Auditory Comprehension: 2-Understands basic 25 - 49% of the time/requires cueing 51 - 75% of the time Expression Expression Mode: Verbal Expression: 1-Expresses basis less than 25% of the time/requires cueing greater than 75% of the time. Social Interaction Social Interaction: 3-Interacts appropriately 50 - 74% of the time - May be physically or verbally inappropriate. Problem Solving Problem Solving: 2-Solves basic 25 - 49% of the time - needs  direction more than half the time to initiate, plan or complete simple activities Memory Memory: 3-Recognizes or recalls 50 - 74% of the time/requires cueing 25 - 49% of the time  Pain Pain Assessment Pain Assessment: No/denies pain  Therapy/Group: Individual Therapy  Carmelia Roller., CCC-SLP [place] 06/30/2013, 4:47 PM

## 2013-06-30 NOTE — Progress Notes (Signed)
Physical Therapy Session Note  Patient Details  Name: CANDISE CRABTREE MRN: [id number] Date of Birth: June 07, 1960  Today's Date: 06/30/2013 Time: 1856-3149 Time Calculation (min): 59 min  Short Term Goals: Week 4:  PT Short Term Goal 1 (Week 4): = LTG of min A overall  Skilled Therapeutic Interventions/Progress Updates:   Pt resting in w/c.  Discussed with pt lengthening LOS and going on a home evaluation tomorrow.  Pt agreeable to longer LOS and home evaluation tomorrow.   Donned shoes and AFO and performed stair negotiation training beginning with L rail only and then switching to R rail only (unclear if rail is on R or L at home)up/down 3 stairs x 2 reps each way; L rail pt ascended forwards and descended laterally and with R rail ascended laterally and descended forwards with mod A with max verbal and tactile cues to recall safe step to sequence, 50% assistance needed to advance and clear RLE but no assistance needed for RLE stabilization in stance.  Performed gait training x 50' with L hemi walker, R AFO and min-mod A with verbal and tactile cues required for upright postural control (Pt tends to pitch trunk forwards during R stance secondary to weak glutes) and for sequencing.  Pt demonstrating improved initiation of R swing phase, improved placement and stabilization during stance.  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  In ADL apartment and back in pt room performed transfer training w/c <> flat bed stand pivot and lateral stepping to L and R in smaller spaces with min A and verbal and tactile cues for sequencing and sit <> supine and rolling in flat bed training with min A overall with verbal, visual and tactile cues for sequencing to increase overall independence.  Returned to room in w/c performing w/c mobility x 150' with L foot  propulsion and min A for obstacle negotiation on R.  In room pt performed w/c set up with max verbal cues and transferred to bed and sit > supine min A.      Therapy Documentation Precautions:  Precautions Precautions: Fall Precaution Comments: right hemiperesis, NPO and maintain HOB >30 deg; MONITOR VITALS; Alert RN for BP >180/>105 and hold activity for BP 220/120, monitor for signs of aspiration or abdominal pain and distention Restrictions Weight Bearing Restrictions: No Vital Signs: Therapy Vitals Temp: 98.1 F (36.7 C) Temp src: Oral Pulse Rate: 88 Resp: 18 BP: 156/99 mmHg Patient Position, if appropriate: Lying Oxygen Therapy SpO2: 97 % O2 Device: None (Room air) Pain: Pain Assessment Pain Assessment: No/denies pain Locomotion : Ambulation Ambulation/Gait Assistance: 4: Min assist Wheelchair Mobility Distance: 100   See FIM for current functional status  Therapy/Group: Individual Therapy  [name] 06/30/2013, 5:13 PM

## 2013-07-01 ENCOUNTER — Inpatient Hospital Stay (HOSPITAL_COMMUNITY): Payer: Medicaid Other | Admitting: Speech Pathology

## 2013-07-01 ENCOUNTER — Inpatient Hospital Stay (HOSPITAL_COMMUNITY): Payer: Medicaid Other | Admitting: Physical Therapy

## 2013-07-01 ENCOUNTER — Inpatient Hospital Stay (HOSPITAL_COMMUNITY): Payer: Medicaid Other | Admitting: Occupational Therapy

## 2013-07-01 DIAGNOSIS — F319 Bipolar disorder, unspecified: Secondary | ICD-10-CM

## 2013-07-01 DIAGNOSIS — E119 Type 2 diabetes mellitus without complications: Secondary | ICD-10-CM

## 2013-07-01 DIAGNOSIS — I6340 Cerebral infarction due to embolism of unspecified cerebral artery: Secondary | ICD-10-CM

## 2013-07-01 DIAGNOSIS — I10 Essential (primary) hypertension: Secondary | ICD-10-CM

## 2013-07-01 LAB — GLUCOSE, CAPILLARY
GLUCOSE-CAPILLARY: 184 mg/dL — AB (ref 70–99)
GLUCOSE-CAPILLARY: 118 mg/dL — AB (ref 70–99)
Glucose-Capillary: 151 mg/dL — ABNORMAL HIGH (ref 70–99)
Glucose-Capillary: 156 mg/dL — ABNORMAL HIGH (ref 70–99)

## 2013-07-01 MED ORDER — LABETALOL HCL 300 MG PO TABS
300.0000 mg | ORAL_TABLET | Freq: Three times a day (TID) | ORAL | Status: DC
  Administered 2013-07-01 – 2013-07-08 (×22): 300 mg via NASOGASTRIC
  Filled 2013-07-01 (×22): qty 1

## 2013-07-01 MED ORDER — RAMIPRIL 5 MG PO CAPS
5.0000 mg | ORAL_CAPSULE | Freq: Two times a day (BID) | ORAL | Status: DC
  Administered 2013-07-01: 5 mg via ORAL
  Filled 2013-07-01 (×3): qty 1

## 2013-07-01 MED ORDER — HYDROCHLOROTHIAZIDE 10 MG/ML ORAL SUSPENSION
6.2500 mg | Freq: Every day | ORAL | Status: DC
  Administered 2013-07-02 – 2013-07-08 (×7): 6.25 mg via ORAL
  Filled 2013-07-01 (×10): qty 1.25

## 2013-07-01 MED ORDER — RAMIPRIL 5 MG PO CAPS
5.0000 mg | ORAL_CAPSULE | Freq: Two times a day (BID) | ORAL | Status: DC
  Administered 2013-07-01 (×2): 5 mg
  Filled 2013-07-01 (×5): qty 1

## 2013-07-01 NOTE — Progress Notes (Signed)
Occupational Therapy Session Note  Patient Details  Name: [name] MRN: [id number] Date of Birth: 1960/09/04  Today's Date: 07/01/2013 Time: 5400-8676 and 1330-1415 (co-tx with PT 1330-1500) Time Calculation (min): 60 min and 45 min  Short Term Goals: Week 4:  OT Short Term Goal 1 (Week 4): LTGs = STGs due to remaining LOS  Skilled Therapeutic Interventions/Progress Updates:    1) Engaged in ADL retraining with focus on functional transfers and hemi-technique with bathing and dressing.  Pt's daughter not present for family education as planned to complete each day leading up to d/c.  Pt in bed upon arrival, performed stand pivot transfer bed > w/c > toilet min assist.  Pt completed clothing management and hygiene this session (only urinating) with min/steady assist for standing balance.  Stand pivot toilet > w/c > sidestepping into walk-in shower with cues for side stepping pattern.  Pt with increased sequencing with bathing at shower level, requires assist to cross RLE over Lt knee to wash foot and then when completing dressing tasks.  Pt continues to require assist with threading RUE through bra and shirt due to mild/intermittent Rt inattention.  Attempted donning socks, with pt requiring setup assist/backward chaining to start donning socks.  2) Conducted home eval with PT with focus on home entry, w/c access into bathroom and bedroom, and functional transfers in home environment.  Pt's bathroom doorway 26", not accessible with 18x18 w/c - however PT recommending 18x16 so doorway should be accessible.  Discussed placement of tub transfer bench and possibility of moving small table in bathroom to make tub bench more accessible.  Daughter performed stand pivot transfer with hemi-walker and discussed carryover of stand pivot transfer to multiple scenarios, ie living room, bed, tub bench, toilet, BSC.  Daughter plans to attend further family education with plan to continue hands on education  and perform bathroom transfers with similar setup to home.  Therapy Documentation Precautions:  Precautions Precautions: Fall Precaution Comments: right hemiperesis, NPO and maintain HOB >30 deg; MONITOR VITALS; Alert RN for BP >180/>105 and hold activity for BP 220/120, monitor for signs of aspiration or abdominal pain and distention Restrictions Weight Bearing Restrictions: No Pain:  Pt with no c/o pain  See FIM for current functional status  Therapy/Group: Individual Therapy and Co-Treatment  [name] [name] 07/01/2013, 10:28 AM

## 2013-07-01 NOTE — Progress Notes (Addendum)
Subjective/Complaints: 53 y.o. female with history of HTN, DM, bipolar disorder, who was admitted on 05/27/13 with right facial droop and aphasia. 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 2 D echo with EF 55-60%. Carotid dopplers without significant ICA stenosis. 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 Patient with progressive neurologic worsening on 05/29/13 pm with RUE >RLE weakness. Started on hypertonic fluids and transferred to ICU for closer monitoring. Follow up CCT without significant changes. Patient with decrease in LOC with worsening of symptoms  No verbal output Smiling Not accurate with head nods Spoke with RN, pt tolerating TF well  Review of Systems - Negative except aphasic not able to consistently do Y/N Objective: Vital Signs: Blood pressure 158/100, pulse 89, temperature 98.3 F (36.8 C), temperature source Oral, resp. rate 18, weight 71.4 kg (157 lb 6.5 oz), SpO2 94.00%. No results found. Results for orders placed during the hospital encounter of 06/04/13 (from the past 72 hour(s))  GLUCOSE, CAPILLARY     Status: Abnormal   Collection Time    06/28/13  7:31 AM      Result Value Ref Range   Glucose-Capillary 122 (*) 70 - 99 mg/dL   Comment 1 Notify RN    GLUCOSE, CAPILLARY     Status: Abnormal   Collection Time    06/28/13 11:30 AM      Result Value Ref Range   Glucose-Capillary 145 (*) 70 - 99 mg/dL   Comment 1 Notify RN    GLUCOSE, CAPILLARY     Status: Abnormal   Collection Time    06/28/13  5:01 PM      Result Value Ref Range   Glucose-Capillary 119 (*) 70 - 99 mg/dL   Comment 1  Notify RN    GLUCOSE, CAPILLARY     Status: Abnormal   Collection Time    06/28/13  8:10 PM      Result Value Ref Range   Glucose-Capillary 123 (*) 70 - 99 mg/dL   Comment 1 Notify RN    GLUCOSE, CAPILLARY     Status: Abnormal   Collection Time    06/29/13  7:56 AM      Result Value Ref Range   Glucose-Capillary 116 (*) 70 - 99 mg/dL  GLUCOSE, CAPILLARY     Status: Abnormal   Collection Time    06/29/13 12:02 PM      Result Value Ref Range   Glucose-Capillary 148 (*) 70 - 99 mg/dL  GLUCOSE, CAPILLARY     Status: None   Collection Time    06/29/13  4:59 PM      Result Value Ref Range   Glucose-Capillary 84  70 - 99 mg/dL  GLUCOSE, CAPILLARY     Status: Abnormal   Collection Time    06/29/13  9:00 PM      Result Value Ref Range   Glucose-Capillary 118 (*) 70 - 99 mg/dL   Comment 1 Notify RN    GLUCOSE, CAPILLARY     Status: Abnormal   Collection Time    06/30/13  7:19 AM      Result Value Ref Range   Glucose-Capillary 131 (*) 70 - 99  mg/dL  GLUCOSE, CAPILLARY     Status: Abnormal   Collection Time    06/30/13 11:48 AM      Result Value Ref Range   Glucose-Capillary 181 (*) 70 - 99 mg/dL  GLUCOSE, CAPILLARY     Status: Abnormal   Collection Time    06/30/13  4:48 PM      Result Value Ref Range   Glucose-Capillary 150 (*) 70 - 99 mg/dL  GLUCOSE, CAPILLARY     Status: Abnormal   Collection Time    06/30/13  9:13 PM      Result Value Ref Range   Glucose-Capillary 151 (*) 70 - 99 mg/dL     HEENT: normal   Cardio: RRR and no murmur Resp: CTA B/L and unlabored GI: BS positive and non distended, G tube intact. Bumper ok.Non tender at ostomy No abnl drainage Extremity:  Pulses positive and No Edema Skin:   Intact Neuro: alert ,smiling, Cranial Nerve Abnormalities R central 7, Abnormal Sensory  Abnormal Motor 0/5 RUE and RLE except 3-R hip flexion,  knee flexion and  ext, normal on Left and Aphasic. 1- 1+ tone RE including bicep,wrist/fingers, hamstring/gastroc  MAS 2-3 R  FF, Thumb flexors, WF, in WHO Musc/Skel:  Other no pain with R shoulder ROM Gen NAD. Very alert   Assessment/Plan: 1. Functional deficits secondary to Left MCA infarct which require 3+ hours per day of interdisciplinary therapy in a comprehensive inpatient rehab setting. Physiatrist is providing close team supervision and 24 hour management of active medical problems listed below. Physiatrist and rehab team continue to assess barriers to discharge/monitor patient progress toward functional and medical goals.   FIM: FIM - Bathing Bathing Steps Patient Completed: Chest;Right Arm;Abdomen;Front perineal area;Buttocks;Right upper leg;Left upper leg Bathing: 3: Mod-Patient completes 5-7 70f 10 parts or 50-74%  FIM - Upper Body Dressing/Undressing Upper body dressing/undressing steps patient completed: Thread/unthread left sleeve of pullover shirt/dress;Put head through opening of pull over shirt/dress;Pull shirt over trunk Upper body dressing/undressing: 3: Mod-Patient completed 50-74% of tasks FIM - Lower Body Dressing/Undressing Lower body dressing/undressing steps patient completed: Thread/unthread right underwear leg;Thread/unthread left underwear leg;Thread/unthread left pants leg;Thread/unthread right pants leg;Pull pants up/down Lower body dressing/undressing: 3: Mod-Patient completed 50-74% of tasks  FIM - Toileting Toileting steps completed by patient: Adjust clothing prior to toileting;Adjust clothing after toileting Toileting Assistive Devices: Grab bar or rail for support Toileting: 3: Mod-Patient completed 2 of 3 steps  FIM - Radio producer Devices: Bedside commode Toilet Transfers: 4-From toilet/BSC: Min A (steadying Pt. > 75%);4-To toilet/BSC: Min A (steadying Pt. > 75%)  FIM - Bed/Chair Transfer Bed/Chair Transfer Assistive Devices: Copy: 4: Supine > Sit: Min A (steadying Pt. > 75%/lift 1 leg);4: Sit > Supine: Min A  (steadying pt. > 75%/lift 1 leg);4: Bed > Chair or W/C: Min A (steadying Pt. > 75%);4: Chair or W/C > Bed: Min A (steadying Pt. > 75%)  FIM - Locomotion: Wheelchair Distance: 100 Locomotion: Wheelchair: 2: Travels 50 - 149 ft with minimal assistance (Pt.>75%) FIM - Locomotion: Ambulation Locomotion: Ambulation Assistive Devices: Cane - Quad;Orthosis Ambulation/Gait Assistance: 4: Min assist Locomotion: Ambulation: 2: Travels 50 - 149 ft with minimal assistance (Pt.>75%)  Comprehension Comprehension Mode: Auditory Comprehension: 2-Understands basic 25 - 49% of the time/requires cueing 51 - 75% of the time  Expression Expression Mode: Nonverbal Expression: 1-Expresses basis less than 25% of the time/requires cueing greater than 75% of the time.  Social Interaction Social Interaction: 3-Interacts appropriately 50 -  74% of the time - May be physically or verbally inappropriate.  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 DVT Prophylaxis/Anticoagulation: Pharmaceutical: Lovenox  2. Pain Management: N/A  3. Bipolar disorder/Mood: No signs of distress reported but patient with poor awareness of deficits. Monitor mood for now. LCSW to follow with patient and family.  4. Neuropsych: This patient is not capable of making decisions on her own behalf.  5. DM type 2: Will monitor with bid checks. Continue to titrate insulin as needed---will change to coordinate with bolus feeding schedule. Use SSI for elevated BS.  6. HTN: adjust for better control avoid hypoperfusion/neuro worsening   Labetalol at max,titrate ACE I,  HCTZ 7. H/o paranoid schizophrenia: Has been to [facility] in the past  due to decompensation. Not on medications as this time--has been on respiridone in the past and has history of non-compliance per records  8. Dysphagia: s/p G-tube   -on bolus feeds   9. 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

## 2013-07-01 NOTE — Progress Notes (Signed)
Physical Therapy Session Note  Patient Details  Name: [name] MRN: [id number] Date of Birth: [birth year]  Today's Date: 07/01/2013 Time: 4650 (home evaluation co-treat with OT)-1500 Time Calculation (min): 45 min  Short Term Goals: Week 4:  PT Short Term Goal 1 (Week 4): = LTG of min A overall  Skilled Therapeutic Interventions/Progress Updates:   Conducted home eval with OT with focus on home entry, w/c access into bathroom and bedroom, and functional transfers in home environment. Pt's driveway paved and able to bring w/c up to side entrance steps.  Entrance is 2 steps to porch with R rail and one step from porch to kitchen.  Performed stair negotiation up/down 2 steps with R rail laterally to ascend and forwards to descend with daughter's or son's mod A with verbal, tactile and visual cues for safe sequence; second person will also be needed to bring w/c on to and off of porch.  Also demonstrated to pt family how to bump w/c into and out of house over door threshold (door 29.5" wide).  Pt able to negotiate in w/c through open kitchen but required min-mod A to manage turns in hallway to get to bathroom and bedroom secondary to narrow space for turns. Pt's bathroom doorway 26", not accessible with 18x18 w/c - however PT recommending 18x16 so doorway should be accessible. Discussed placement of tub transfer bench and possibility of moving small table in bathroom to make tub bench more accessible. Transitioned to bed room and measured bed (21" tall) and demonstrated to son and daughter how to assist pt with stand pivot transfer bed <> w/c with hemi walker and supine <> sit and rolling in flat bed with attention to UE positioning.  Daughter performed stand pivot transfer with hemi-walker and discussed carryover of stand pivot transfer to multiple scenarios, ie living room, bed, tub bench, toilet, BSC.   Transitioned to living room where pt and PT performed transfer w/c <> recliner with 180 deg  turn with UE support on hemi walker with min A to transfer to recliner but mod A to stand from low, rocking recliner.  Discussed with family placing something behind recliner to keep it from rocking and moving boxes to allow w/c to be at 90 deg angle from recliner for more efficient transfer.  Daughter plans to attend further family education with plan to continue hands on education and perform all functional transfers, stair negotiation, and gait with similar setup to home.  Therapy Documentation Precautions:  Precautions Precautions: Fall Precaution Comments: right hemiperesis, NPO and maintain HOB >30 deg; MONITOR VITALS; Alert RN for BP >180/>105 and hold activity for BP 220/120, monitor for signs of aspiration or abdominal pain and distention Restrictions Weight Bearing Restrictions: No Vital Signs: Therapy Vitals Temp: 98.3 F (36.8 C) Temp src: Oral Pulse Rate: 85 Resp: 18 BP: 162/113 mmHg Patient Position, if appropriate: Lying Oxygen Therapy SpO2: 97 % O2 Device: None (Room air) Pain: Pain Assessment Pain Assessment: No/denies pain Locomotion : Ambulation Ambulation/Gait Assistance: 4: Min assist Wheelchair Mobility Distance: 50   See FIM for current functional status  Therapy/Group: Home evaluation with OT  [name] 07/01/2013, 3:54 PM

## 2013-07-01 NOTE — Progress Notes (Signed)
Speech Language Pathology Daily Session Note  Patient Details  Name: [name] MRN: [id number] Date of Birth: 12-14-1960  Today's Date: 07/01/2013 Time: 6269-4854 Time Calculation (min): 45 min  Short Term Goals: Week 3: SLP Short Term Goal 1 (Week 3): Patient will identify object from a field of 2 with Mod multi-modal cues. SLP Short Term Goal 2 (Week 3): Patient will approximate speech sounds during automatic speech tasks with Mod assist multi-modal cues. SLP Short Term Goal 3 (Week 3): Patient will consume trilas of PO with SLP without need for oral suctioning and Min vebral cues for use of swallow strategies SLP Short Term Goal 4 (Week 3): Patient will copy CVC word with Max assist multi-modal cues to identify moments of perseveration.  Skilled Therapeutic Interventions: Skilled treatment session focused on addressing dysphagia, speech and language goals.  SLP facilitated session with trials of thin via cup, puree and regular textures.  Patient consumed trials with Min verbal cues and 3-4 swallows due to oral phase impairments.  Patient with cough x2 with large cup sip.  SLP also facilitated session by providing Total assist multimodal cues for automatic verbal task of counting. Patient attempted approximations with oral musculature and voiced on initial number "one" in most opportunities. Patient identified functional items from field of two with 70% accuracy with Mod clinician cues. Patient independently wrote CVC word "cup" but required Total assist to write "pen."  Daughter present at end of session and reported that she had questions but could not remember them; as a result, SLP provided written aid with general aphasia recommendations.  Continue with current plan of care.     FIM:  Comprehension Comprehension Mode: Auditory Comprehension: 2-Understands basic 25 - 49% of the time/requires cueing 51 - 75% of the time Expression Expression Mode: Verbal Expression: 1-Expresses  basis less than 25% of the time/requires cueing greater than 75% of the time. Social Interaction Social Interaction: 3-Interacts appropriately 50 - 74% of the time - May be physically or verbally inappropriate. Problem Solving Problem Solving: 2-Solves basic 25 - 49% of the time - needs direction more than half the time to initiate, plan or complete simple activities Memory Memory: 3-Recognizes or recalls 50 - 74% of the time/requires cueing 25 - 49% of the time  Pain Pain Assessment Pain Assessment: No/denies pain  Therapy/Group: Individual Therapy  Carmelia Roller., [place]  Mora Appl 07/01/2013, 1:05 PM

## 2013-07-02 ENCOUNTER — Inpatient Hospital Stay (HOSPITAL_COMMUNITY): Payer: Medicaid Other | Admitting: Occupational Therapy

## 2013-07-02 ENCOUNTER — Inpatient Hospital Stay (HOSPITAL_COMMUNITY): Payer: Self-pay | Admitting: Occupational Therapy

## 2013-07-02 ENCOUNTER — Ambulatory Visit (HOSPITAL_COMMUNITY): Payer: Self-pay | Admitting: Physical Therapy

## 2013-07-02 ENCOUNTER — Inpatient Hospital Stay (HOSPITAL_COMMUNITY): Payer: Medicaid Other | Admitting: Speech Pathology

## 2013-07-02 DIAGNOSIS — I10 Essential (primary) hypertension: Secondary | ICD-10-CM

## 2013-07-02 DIAGNOSIS — F319 Bipolar disorder, unspecified: Secondary | ICD-10-CM

## 2013-07-02 DIAGNOSIS — E119 Type 2 diabetes mellitus without complications: Secondary | ICD-10-CM

## 2013-07-02 DIAGNOSIS — I6340 Cerebral infarction due to embolism of unspecified cerebral artery: Secondary | ICD-10-CM

## 2013-07-02 LAB — GLUCOSE, CAPILLARY
GLUCOSE-CAPILLARY: 114 mg/dL — AB (ref 70–99)
Glucose-Capillary: 121 mg/dL — ABNORMAL HIGH (ref 70–99)
Glucose-Capillary: 171 mg/dL — ABNORMAL HIGH (ref 70–99)
Glucose-Capillary: 162 mg/dL — ABNORMAL HIGH (ref 70–99)
Glucose-Capillary: 120 mg/dL — ABNORMAL HIGH (ref 70–99)

## 2013-07-02 MED ORDER — AMLODIPINE BESYLATE 5 MG PO TABS
5.0000 mg | ORAL_TABLET | Freq: Every day | ORAL | Status: DC
  Administered 2013-07-02: 5 mg via ORAL
  Filled 2013-07-02 (×3): qty 1

## 2013-07-02 NOTE — Progress Notes (Signed)
Social Work Patient ID: [name], female   DOB: 1960/09/02, 53 y.o.   MRN: [id number] Met with daughter who is here for family education.  Discussed the amount of care pt requires and if she will have help at home. She is unsure but is making a sacrifice of her own.  Discussed NHP with daughter and she doesn't want Mom to go to a NH or be 100 miles away. She feels her mother will not do well nor will she.  She wants to try it at home with home health therapies.  She will come back tomorrow to complete family  Education.  She will talk with her brother and see if he will assist her.  Have [facility] to [facility] follow up and medicines. Will continue to talk with daughter to provide her support and that the discharge plan is safe for both of them.

## 2013-07-02 NOTE — Progress Notes (Signed)
NUTRITION FOLLOW UP  Intervention:   1. Enteral nutrition; Continue Glucerna 1.2 350 mL 4 times daily.  This meets intake goals of >/=90% of estimated needs.  Continue current bolus regimen and monitor weights for adequacy.   NUTRITION DIAGNOSIS:  Inadequate oral intake related to inability to eat, dysphagia as evidenced by SLP assessment.   Monitor:  1. Enteral nutrition; initiation with tolerance. Pt to meet >/=90% estimated needs with nutrition support. Met.  2. Wt/wt change; monitor trends. Ongoing.   Assessment:   52 y.o. female with history of HTN, DM, bipolar disorder, who was admitted on 05/27/13 with right facial droop and aphasia. 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   Pt continues to tolerate regimen well. Current regimen of 350 mL 4 times daily provides 1680 kcal, 84g protein, and 1135 mL free water which meets 93% estimated energy needs.   Pt with 200 mL fluid boluses 5 times daily for 2135 mL fluid/day.  Wt somewhat variable, but overall stable.   Height: Ht Readings from Last 1 Encounters:  05/27/13 5' 6" (1.676 m)    Weight Status:   Wt Readings from Last 1 Encounters:  07/02/13 159 lb 6.3 oz (72.3 kg)   Re-estimated needs:  Kcal: 1800-1980 Protein: 70-85g Fluid: >1.8 L/day  Skin: intact  Diet Order: NPO   Intake/Output Summary (Last 24 hours) at 07/02/13 1042 Last data filed at 07/01/13 2150  Gross per 24 hour  Intake    550 ml  Output      0 ml  Net    550 ml    Last BM: 4/14  Labs:  No results found for this basename: NA, K, CL, CO2, BUN, CREATININE, CALCIUM, MG, PHOS, GLUCOSE,  in the last 168 hours  CBG (last 3)   Recent  Labs  07/01/13 2153 07/02/13 0658 07/02/13 0751  GLUCAP 156* 121* 114*    Scheduled Meds: . amLODipine  5 mg Oral Daily  . antiseptic oral rinse  15 mL Mouth Rinse q12n4p  . aspirin  325 mg Per Tube Daily  . baclofen  5 mg Oral BID  . cephALEXin  250 mg Oral 3 times per day  . chlorhexidine  15 mL Mouth Rinse BID  . enoxaparin (LOVENOX) injection  30 mg Subcutaneous Q24H  . feeding supplement (GLUCERNA 1.2 CAL)  350 mL Per Tube QID  . free water  200 mL Per Tube 5 X Daily  . hydrochlorothiazide  6.25 mg Oral Daily  . insulin aspart  0-15 Units Subcutaneous TID AC & HS  . insulin glargine  20 Units Subcutaneous BID  . labetalol  300 mg Per NG tube TID  . mupirocin ointment   Nasal BID  . nystatin  5 mL Oral QID  . simvastatin  20 mg Per Tube q1800    Continuous Infusions:     , MS RD LDN Clinical Inpatient Dietitian Pager: [phone] Weekend/After hours pager: [phone]  

## 2013-07-02 NOTE — Progress Notes (Signed)
Occupational Therapy Session Note  Patient Details  Name: ALEIGH GRUNDEN MRN: [id number] Date of Birth: [birth year]  Today's Date: 07/02/2013 Time: 1020-1115 Time Calculation (min): 55 min  Short Term Goals: Week 4:  OT Short Term Goal 1 (Week 4): LTGs = STGs due to remaining LOS  Skilled Therapeutic Interventions/Progress Updates:    1) Engaged in ADL retraining with focus on transfers and hemi-technique with bathing and dressing.  Pt's daughter not present for scheduled family ed. Engaged in bed > w/c transfer from flat bed with min assist.  Bathing completed at sink with setup assist and min/steady assist to maintain standing balance while pt washed perineal area and buttocks.  During bathing, apparent pt needed to toilet.  Performed stand pivot transfer to [facility] where pt was continent of urine, but required assist with hygiene secondary to smear of BM with hygiene.  Pt able to don brief and pants this session with setup assist and stabilization of RLE over Lt knee.  Continues to require mod verbal and tactile cues for sequencing of hemi-dressing technique.   Pt's daughter arrived with 15 mins left of scheduled session.  Engaged in bed mobility and transfers with daughter as she had concerns from yesterday's home eval.  Therapist demonstrated appropriate hand placement and verbal cues to provide to assist with transfer to/from bed with use of hemi-walker for stability.  Pt able to recall and demonstrate bed mobility. Post demonstration and discussion, daughter and pt return demonstrated stand step transfer with hemi-walker to/from bed x2 with no cues from therapist.  Daughter reports feeling more confident with transfers after additional practice.  Discussed importance of attending family education for tomorrow prior to d/c home with daughter on Friday.    Therapy Documentation Precautions:  Precautions Precautions: Fall Precaution Comments: right hemiperesis, NPO and maintain HOB >30 deg;  MONITOR VITALS; Alert RN for BP >180/>105 and hold activity for BP 220/120, monitor for signs of aspiration or abdominal pain and distention Restrictions Weight Bearing Restrictions: No Pain:  Pt with no c/o pain  See FIM for current functional status  Therapy/Group: Individual Therapy  Kerrie Buffalo 07/02/2013, 11:37 AM

## 2013-07-02 NOTE — Progress Notes (Signed)
Subjective/Complaints: 53 y.o. female with history of HTN, DM, bipolar disorder, who was admitted on 05/27/13 with right facial droop and aphasia. 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 2 D echo with EF 55-60%. Carotid dopplers without significant ICA stenosis. 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 Patient with progressive neurologic worsening on 05/29/13 pm with RUE >RLE weakness. Started on hypertonic fluids and transferred to ICU for closer monitoring. Follow up CCT without significant changes. Patient with decrease in LOC with worsening of symptoms    Review of Systems - Negative except aphasic not able to consistently do Y/N Objective: Vital Signs: Blood pressure 161/103, pulse 87, temperature 97.8 F (36.6 C), temperature source Oral, resp. rate 17, weight 72.3 kg (159 lb 6.3 oz), SpO2 100.00%. No results found. Results for orders placed during the hospital encounter of 06/04/13 (from the past 72 hour(s))  GLUCOSE, CAPILLARY     Status: Abnormal   Collection Time    06/29/13  7:56 AM      Result Value Ref Range   Glucose-Capillary 116 (*) 70 - 99 mg/dL  GLUCOSE, CAPILLARY     Status: Abnormal   Collection Time    06/29/13 12:02 PM      Result Value Ref Range   Glucose-Capillary 148 (*) 70 - 99 mg/dL  GLUCOSE, CAPILLARY     Status: None   Collection Time    06/29/13  4:59 PM      Result Value Ref Range   Glucose-Capillary 84  70 - 99 mg/dL  GLUCOSE, CAPILLARY     Status: Abnormal   Collection Time    06/29/13  9:00 PM      Result Value Ref Range   Glucose-Capillary 118 (*) 70 - 99 mg/dL   Comment  1 Notify RN    GLUCOSE, CAPILLARY     Status: Abnormal   Collection Time    06/30/13  7:19 AM      Result Value Ref Range   Glucose-Capillary 131 (*) 70 - 99 mg/dL  GLUCOSE, CAPILLARY     Status: Abnormal   Collection Time    06/30/13 11:48 AM      Result Value Ref Range   Glucose-Capillary 181 (*) 70 - 99 mg/dL  GLUCOSE, CAPILLARY     Status: Abnormal   Collection Time    06/30/13  4:48 PM      Result Value Ref Range   Glucose-Capillary 150 (*) 70 - 99 mg/dL  GLUCOSE, CAPILLARY     Status: Abnormal   Collection Time    06/30/13  9:13 PM      Result Value Ref Range   Glucose-Capillary 151 (*) 70 - 99 mg/dL  GLUCOSE, CAPILLARY     Status: Abnormal   Collection Time    07/01/13  7:12 AM      Result Value Ref Range   Glucose-Capillary 151 (*) 70 - 99 mg/dL  GLUCOSE, CAPILLARY     Status: Abnormal   Collection Time    07/01/13 11:23 AM      Result Value Ref Range   Glucose-Capillary 184 (*) 70 - 99 mg/dL  GLUCOSE, CAPILLARY     Status: Abnormal  Collection Time    07/01/13  4:38 PM      Result Value Ref Range   Glucose-Capillary 118 (*) 70 - 99 mg/dL  GLUCOSE, CAPILLARY     Status: Abnormal   Collection Time    07/01/13  9:53 PM      Result Value Ref Range   Glucose-Capillary 156 (*) 70 - 99 mg/dL  GLUCOSE, CAPILLARY     Status: Abnormal   Collection Time    07/02/13  6:58 AM      Result Value Ref Range   Glucose-Capillary 121 (*) 70 - 99 mg/dL     HEENT: normal   Cardio: RRR and no murmur Resp: CTA B/L and unlabored GI: BS positive and non distended, G tube intact. Bumper ok.Non tender at ostomy No abnl drainage Extremity:  Pulses positive and No Edema Skin:   Intact Neuro: alert ,smiling, Cranial Nerve Abnormalities R central 7, Abnormal Sensory  Abnormal Motor 0/5 RUE and RLE except 3-R hip flexion,  knee flexion and  ext, normal on Left and Aphasic. 1- 1+ tone RE including bicep,wrist/fingers, hamstring/gastroc  MAS 2-3 R FF, Thumb flexors, WF, in  WHO Musc/Skel:  Other no pain with R shoulder ROM Gen NAD. Very alert   Assessment/Plan: 1. Functional deficits secondary to Left MCA infarct which require 3+ hours per day of interdisciplinary therapy in a comprehensive inpatient rehab setting. Physiatrist is providing close team supervision and 24 hour management of active medical problems listed below. Physiatrist and rehab team continue to assess barriers to discharge/monitor patient progress toward functional and medical goals.   FIM: FIM - Bathing Bathing Steps Patient Completed: Chest;Right Arm;Abdomen;Front perineal area;Buttocks;Right upper leg;Left upper leg;Right lower leg (including foot);Left lower leg (including foot) Bathing: 4: Min-Patient completes 8-9 29f 10 parts or 75+ percent  FIM - Upper Body Dressing/Undressing Upper body dressing/undressing steps patient completed: Thread/unthread left bra strap;Thread/unthread right bra strap;Thread/unthread left sleeve of pullover shirt/dress;Put head through opening of pull over shirt/dress;Pull shirt over trunk Upper body dressing/undressing: 3: Mod-Patient completed 50-74% of tasks FIM - Lower Body Dressing/Undressing Lower body dressing/undressing steps patient completed: Thread/unthread right underwear leg;Thread/unthread left underwear leg;Thread/unthread right pants leg;Thread/unthread left pants leg;Pull pants up/down;Pull underwear up/down;Don/Doff left shoe Lower body dressing/undressing: 3: Mod-Patient completed 50-74% of tasks  FIM - Toileting Toileting steps completed by patient: Adjust clothing prior to toileting;Adjust clothing after toileting Toileting Assistive Devices: Grab bar or rail for support Toileting: 3: Mod-Patient completed 2 of 3 steps  FIM - Radio producer Devices: Grab bars Toilet Transfers: 4-To toilet/BSC: Min A (steadying Pt. > 75%);4-From toilet/BSC: Min A (steadying Pt. > 75%)  FIM - Bed/Chair Transfer Bed/Chair  Transfer Assistive Devices: Walker;Orthosis Bed/Chair Transfer: 4: Supine > Sit: Min A (steadying Pt. > 75%/lift 1 leg);4: Sit > Supine: Min A (steadying pt. > 75%/lift 1 leg);4: Bed > Chair or W/C: Min A (steadying Pt. > 75%);4: Chair or W/C > Bed: Min A (steadying Pt. > 75%)  FIM - Locomotion: Wheelchair Distance: 50 Locomotion: Wheelchair: 2: Travels 50 - 149 ft with minimal assistance (Pt.>75%) FIM - Locomotion: Ambulation Locomotion: Ambulation Assistive Devices: Cane - Quad;Orthosis Ambulation/Gait Assistance: 4: Min assist Locomotion: Ambulation: 1: Travels less than 50 ft with minimal assistance (Pt.>75%)  Comprehension Comprehension Mode: Auditory Comprehension: 2-Understands basic 25 - 49% of the time/requires cueing 51 - 75% of the time  Expression Expression Mode: Verbal Expression: 1-Expresses basis less than 25% of the time/requires cueing greater than 75% of the time.  Social Interaction Social Interaction: 3-Interacts appropriately 50 - 74% of the time - May be physically or verbally inappropriate.  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 DVT Prophylaxis/Anticoagulation: Pharmaceutical: Lovenox  2. Pain Management: N/A  3. Bipolar disorder/Mood: No signs of distress reported but patient with poor awareness of deficits. Monitor mood for now. LCSW to follow with patient and family.  4. Neuropsych: This patient is not capable of making decisions on her own behalf.  5. DM type 2: Will monitor with bid checks. Continue to titrate insulin as needed---will change to coordinate with bolus feeding schedule. Use SSI for elevated BS.  6. HTN: adjust for better  control avoid hypoperfusion/neuro worsening   Labetalol at max,not much effect from ACE I,  HCTZ, change to norvasc 7. H/o paranoid schizophrenia: Has been to [facility] in the past due to decompensation. Not on medications as this time--has been on respiridone in the past and has history of non-compliance per records  8. Dysphagia: s/p G-tube   -on bolus feeds   9. Resting Tachycardia:     improved    , Normodyne 300mg  VT TID starting 4/6, HR improved now in 80s monitor bradycardia 10. Hypertonicity:  scheduled baclofen low dose  -right WHO, increasing elbow flexor tone   LOS (Days) 28 A FACE TO FACE EVALUATION WAS PERFORMED  Charlett Blake 07/02/2013, 7:17 AM

## 2013-07-02 NOTE — Progress Notes (Signed)
Occupational Therapy Note  Patient Details  Name: KELI BUEHNER MRN: [id number] Date of Birth: November 01, 1960 Today's Date: 07/02/2013  Pt missed 30 mins skilled OT treatment session secondary to medical incident with daughter.  Upon therapist arrival, daughter present and visiting with mother planned to participate in hands on family education with conducting bathroom transfers (toilet and tub bench).  Therapist wheeling pt in w/c to ADL apt when turned to find daughter collapsed on floor.  Alerted RN, code called, and rapid response called. Due to medical event with daughter, hands on family education unable to occur at this time.  PA informed, [place] notified of inability to complete family education and need to pursue other options for d/c.    Holli Humbles Arvell Pulsifer 07/02/2013, 2:20 PM

## 2013-07-02 NOTE — Progress Notes (Signed)
Physical Therapy Session Note  Patient Details  Name: [name] MRN: [id number] Date of Birth: 1961-03-07  Today's Date: 07/02/2013 Time: 6578-4696 Time Calculation (min): 43 min  Short Term Goals: Week 4:  PT Short Term Goal 1 (Week 4): = LTG of min A overall  Skilled Therapeutic Interventions/Progress Updates:   Pt just witnessed her daughter experience a syncope episode and collapse into floor in prone.  Upon PT arrival pt's daughter being transported to the ED by rapid response.  Pt transitioned to gym in w/c total A to allow NT to clean pt room.  Pt BP assessed in sitting: 196/118 above parameters for treatment.  RN alerted and pt returned to room and to bed with min A to allow RN to provide pt with BP medication.  Pt daughter is the D/C caregiver; will likely need to re-assess D/C plan.  Will discuss with team.   Therapy Documentation Precautions:  Precautions Precautions: Fall Precaution Comments: right hemiperesis, NPO and maintain HOB >30 deg; MONITOR VITALS; Alert RN for BP >180/>105 and hold activity for BP 220/120, monitor for signs of aspiration or abdominal pain and distention Restrictions Weight Bearing Restrictions: No General: Amount of Missed PT Time (min): 60 Minutes Missed Time Reason: MD hold (comment);Other (comment) (BP and pt just witnessed daughter syncope and transfer to ED) Vital Signs: Therapy Vitals Pulse Rate: 81 BP: 196/118 mmHg Patient Position, if appropriate: Sitting Oxygen Therapy SpO2: 100 % O2 Device: None (Room air) Pain: Pain Assessment Pain Assessment: No/denies pain  [name] 07/02/2013, 2:35 PM

## 2013-07-02 NOTE — Progress Notes (Signed)
Speech Language Pathology Daily Session Note  Patient Details  Name: [name] MRN: [id number] Date of Birth: 1960/12/01  Today's Date: 07/02/2013 Time: 6767-2094 Time Calculation (min): 43 min  Short Term Goals: Week 3: SLP Short Term Goal 1 (Week 3): Patient will identify object from a field of 2 with Mod multi-modal cues. SLP Short Term Goal 2 (Week 3): Patient will approximate speech sounds during automatic speech tasks with Mod assist multi-modal cues. SLP Short Term Goal 3 (Week 3): Patient will consume trilas of PO with SLP without need for oral suctioning and Min vebral cues for use of swallow strategies SLP Short Term Goal 4 (Week 3): Patient will copy CVC word with Max assist multi-modal cues to identify moments of perseveration.  Skilled Therapeutic Interventions: Skilled treatment session focused on addressing dysphagia, speech and language goals.  SLP facilitated session with trials of thin via cup and soft solid textures.  Patient consumed trials with Min verbal cues and 2-3 swallows due to oral phase impairments.  Patient with no overt s/s of aspiration or need for suctioning.  SLP also facilitated session by providing Total assist multimodal cues for automatic verbal task of counting. Patient attempted approximations with oral musculature without voicing attempts today.  Patient identified functional items from field of two with 70% accuracy with Mod clinician cues. Continue with current plan of care as well as family education.   FIM:  Comprehension Comprehension Mode: Auditory Comprehension: 2-Understands basic 25 - 49% of the time/requires cueing 51 - 75% of the time Expression Expression Mode: Verbal Expression: 1-Expresses basis less than 25% of the time/requires cueing greater than 75% of the time. Social Interaction Social Interaction: 3-Interacts appropriately 50 - 74% of the time - May be physically or verbally inappropriate. Problem Solving Problem  Solving: 2-Solves basic 25 - 49% of the time - needs direction more than half the time to initiate, plan or complete simple activities Memory Memory: 3-Recognizes or recalls 50 - 74% of the time/requires cueing 25 - 49% of the time  Pain Pain Assessment Pain Assessment: No/denies pain  Therapy/Group: Individual Therapy  Carmelia Roller., [place]  Mora Appl 07/02/2013, 12:45 PM

## 2013-07-02 NOTE — Patient Care Conference (Signed)
Inpatient RehabilitationTeam Conference and Plan of Care Update Date: 07/02/2013   Time: 11:40 AM    Patient Name: [name]      Medical Record Number: [id number]  Date of Birth: 04-28-1960 Sex: Female         Room/Bed: 4M09C/4M09C-01 Payor Info: Payor: MEDICAID PENDING / Plan: MEDICAID PENDING / Product Type: *No Product type* /    Admitting Diagnosis: L MCA CVA  Admit Date/Time:  06/04/2013  2:30 PM Admission Comments: No comment available   Primary Diagnosis:  <principal problem not specified> Principal Problem: <principal problem not specified>  Patient Active Problem List   Diagnosis Date Noted  . CVA (cerebral infarction) 06/04/2013  . Dysphagia 06/02/2013  . Benign polycythemia 06/02/2013  . Malignant hypertension 05/28/2013  . DM (diabetes mellitus) type II controlled, neurological manifestation 05/27/2013  . Acute embolic stroke 09/32/3557  . 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 Hypertensive emergency 05/27/2013  . Stroke 05/27/2013  . Aphasia complicating stroke 32/20/2542    Expected Discharge Date: Expected Discharge Date: 07/04/13  Team Members Present: Physician leading conference: Dr. Alysia Penna Social Worker Present: Alfonse Alpers, LCSW;Becky Karlo Goeden, LCSW Nurse Present: Salena Saner, RN PT Present: Raylene Everts, PT;Emily Rinaldo Cloud, PT OT Present: Clyda Greener, Starling Manns, Jules Schick, OT SLP Present: Gunnar Fusi, SLP PPS Coordinator present : Ileana Ladd, Lelan Pons, RN, CRRN     Current Status/Progress Goal Weekly Team Focus  Medical   HTN improved but still elevated  reduce diastolic ZBP to <706 consistent  family training, med adjustment   Bowel/Bladder             Swallow/Nutrition/ Hydration   Mod assist with PO trials  Mod assist with PO trials  contiue to increase volume of intake    ADL's   min assist bathing, mod assist UB dressing and LB dressing, min-mod assist stand pivot transfers  min assist overall, LB  dressing and toileting mod assist  standing balance, ADL retraining, family education in preparation for d/c Fri   Mobility   Supervision bed mobility, min A transfers and w/c mobility, min-mod A gait and stair negotiation  Min A overall  D/C planning, family education and transfer training   Communication   Max assist   downgraded to Max assist   family education    Safety/Cognition/ Behavioral Observations  Mod assist   Mod assist   incaease initiation and family education    Pain             Skin                *See Care Plan and progress notes for long and short-term goals.  Barriers to Discharge: severe weakness, needs enteral feeds    Possible Resolutions to Barriers:  fam ed    Discharge Planning/Teaching Needs:  HOme eval yesterday-went well. Daughter to be here to continue family education until Friday      Team Discussion:  Family training with daughter.  Discuss NH versus NH, much care for daughter on her own.  No other family member has stepped up.  Daughter wants to take home and not have her go to a NH.  Revisions to Treatment Plan:  None   Continued Need for Acute Rehabilitation Level of Care: The patient requires daily medical management by a physician with specialized training in physical medicine and rehabilitation for the following conditions: Daily direction of a multidisciplinary physical rehabilitation program to ensure safe treatment while eliciting the highest outcome  that is of practical value to the patient.: Yes Daily medical management of patient stability for increased activity during participation in an intensive rehabilitation regime.: Yes Daily analysis of laboratory values and/or radiology reports with any subsequent need for medication adjustment of medical intervention for : Neurological problems;Other  [name] [name] 07/03/2013, 2:49 PM

## 2013-07-02 NOTE — Progress Notes (Signed)
Social Work Patient ID: [name], female   DOB: April 08, 1960, 52 y.o.   MRN: [id number] Pt needs a lightweight wheelchair she is unable to self propel a standard weight wheelchair and uses for her self care tasks also.

## 2013-07-03 ENCOUNTER — Inpatient Hospital Stay (HOSPITAL_COMMUNITY): Payer: Medicaid Other | Admitting: Occupational Therapy

## 2013-07-03 ENCOUNTER — Inpatient Hospital Stay (HOSPITAL_COMMUNITY): Payer: Medicaid Other | Admitting: *Deleted

## 2013-07-03 ENCOUNTER — Inpatient Hospital Stay (HOSPITAL_COMMUNITY): Payer: Medicaid Other | Admitting: Speech Pathology

## 2013-07-03 LAB — GLUCOSE, CAPILLARY
GLUCOSE-CAPILLARY: 123 mg/dL — AB (ref 70–99)
GLUCOSE-CAPILLARY: 153 mg/dL — AB (ref 70–99)
Glucose-Capillary: 193 mg/dL — ABNORMAL HIGH (ref 70–99)
Glucose-Capillary: 115 mg/dL — ABNORMAL HIGH (ref 70–99)

## 2013-07-03 MED ORDER — AMLODIPINE BESYLATE 10 MG PO TABS
10.0000 mg | ORAL_TABLET | Freq: Every day | ORAL | Status: DC
  Administered 2013-07-03 – 2013-07-08 (×6): 10 mg via ORAL
  Filled 2013-07-03 (×9): qty 1

## 2013-07-03 NOTE — Progress Notes (Signed)
Physical Therapy Session Note  Patient Details  Name: [name] MRN: [id number] Date of Birth: 12-09-1960  Today's Date: 07/03/2013 Time: 9:03-10:00 (28min)   Skilled Therapeutic Interventions/Progress Updates:  Tx focused on directing self-care, functional mobiltiy, and dynamic sitting and standing balance.  Pt resting in bed, indicating needing something; affirmative for bathroom.  Supine>sit with min A as well as gait with RW in room and bathroom with Min A.   Pt engaged in static and dynamic sitting balance at toilet and sink with Min A and multiple reps sit<>stand.    Pt needing min A for UB and LB dressing, and was able to demonstrate intellectual and emergent awareness as well as sustained attention x73min during functional mobility tasks.   Gait in controlled setting 1x40' with Min A. Pt not wanting to don orthosis at this time. Gait marked by impaired R step placement and timing, decreased R weight shift, and narrow BOS during turns. Distance limited by fatigue.       Therapy Documentation Precautions:  Precautions Precautions: Fall Precaution Comments: right hemiperesis, NPO and maintain HOB >30 deg; MONITOR VITALS; Alert RN for BP >180/>105 and hold activity for BP 220/120, monitor for signs of aspiration or abdominal pain and distention Restrictions Weight Bearing Restrictions: No General:   Vital Signs: Therapy Vitals Temp: 98.8 F (37.1 C) Temp src: Oral Pulse Rate: 62 Resp: 18 BP: 158/116 mmHg Patient Position, if appropriate: Sitting Oxygen Therapy SpO2: 95 % O2 Device: None (Room air) Pain: Pain Assessment Pain Assessment: No/denies pain Pain Intervention(s): Medication (See eMAR) (Premedicate for therapy)  See FIM for current functional status  Therapy/Group: Individual Therapy  Kennieth Rad, PT, DPT  07/03/2013, 9:48 AM

## 2013-07-03 NOTE — Progress Notes (Signed)
Occupational Therapy Session Note  Patient Details  Name: [name] MRN: [id number] Date of Birth: 1960-03-31  Today's Date: 07/03/2013 Time: 8335-8251 Time Calculation (min): 76 min  Short Term Goals: Week 4:  OT Short Term Goal 1 (Week 4): LTGs = STGs due to remaining LOS  Skilled Therapeutic Interventions/Progress Updates:    Engaged in NM re-ed in supine, sidelying, and sitting with focus on Rt shoulder PROM and WB as well as trunk control in unsupported sitting.  Performed stand pivot w/c to therapy mat with min assist.  Engaged in lateral scoots as well as anterior/posterior scoots to challenge trunk control.  PROM of shoulder in supine and sidelying with pt grimacing with abduction approx 80 degrees and shoulder flexion > 120.  After scapular mobilizations and PROM in sidelying pt able to tolerate increased shoulder flexion in supine.  Engaged in WB in sitting with pt WB through RUE while reaching across midline to complete connect 4 pattern activity with pt with no adherence to pattern despite mod verbal cues.  WB through elbow with demonstration and verbal cues to obtain proper positioning, max verbal cues to duplicate pattern with calling successive colors with pt with approx 50% accuracy with identifying color.  After repetition, pt able to continue on with pattern with fading cues.  Following WB, engaged in towel glides with hand over hand assist to encourage shoulder flexion and horizontal abduction/adduction.  Pt returned to w/c min assist stand step transfer and left in room with quick release belt and call bell in reach.  Therapy Documentation Precautions:  Precautions Precautions: Fall Precaution Comments: right hemiperesis, NPO and maintain HOB >30 deg; MONITOR VITALS; Alert RN for BP >180/>105 and hold activity for BP 220/120, monitor for signs of aspiration or abdominal pain and distention Restrictions Weight Bearing Restrictions: No General:   Vital Signs: Therapy  Vitals Pulse Rate: 93 BP: 157/92 mmHg Patient Position, if appropriate: Sitting Pain: Pain Assessment Pain Assessment: No/denies pain  See FIM for current functional status  Therapy/Group: Individual Therapy  Kerrie Buffalo 07/03/2013, 12:11 PM

## 2013-07-03 NOTE — Progress Notes (Signed)
Speech Language Pathology Daily Session Note  Patient Details  Name: [name] MRN: [id number] Date of Birth: May 31, 1960  Today's Date: 07/03/2013 Time: 1310-1350 Time Calculation (min): 40 min  Short Term Goals: Week 3: SLP Short Term Goal 1 (Week 3): Patient will identify object from a field of 2 with Mod multi-modal cues. SLP Short Term Goal 2 (Week 3): Patient will approximate speech sounds during automatic speech tasks with Mod assist multi-modal cues. SLP Short Term Goal 3 (Week 3): Patient will consume trilas of PO with SLP without need for oral suctioning and Min vebral cues for use of swallow strategies SLP Short Term Goal 4 (Week 3): Patient will copy CVC word with Max assist multi-modal cues to identify moments of perseveration.  Skilled Therapeutic Interventions: Skilled treatment session focused on addressing dysphagia, speech and language goals.  SLP facilitated session with trials of thin via cup and soft solid textures.  Patient consumed trials with Supervision verbal cues, increased time and 2-3 swallows due to oral phase impairments.  Patient with no overt s/s of aspiration or need for suctioning.  SLP also facilitated session by providing Total assist multimodal cues for automatic verbal task of counting. Patient attempted approximations with oral musculature without voicing attempts today.  SLP called patient which was an effective means of facilitating vocalizing attempts.  Patient identified functional items from field of two with 60% accuracy with Mod clinician cues. Recommend initiation of lunch tray with SLP tomorrow.   FIM:  Comprehension Comprehension Mode: Auditory Comprehension: 2-Understands basic 25 - 49% of the time/requires cueing 51 - 75% of the time Expression Expression Mode: Verbal Expression: 1-Expresses basis less than 25% of the time/requires cueing greater than 75% of the time. Social Interaction Social Interaction: 3-Interacts appropriately  50 - 74% of the time - May be physically or verbally inappropriate. Problem Solving Problem Solving: 2-Solves basic 25 - 49% of the time - needs direction more than half the time to initiate, plan or complete simple activities Memory Memory: 3-Recognizes or recalls 50 - 74% of the time/requires cueing 25 - 49% of the time  Pain Pain Assessment Pain Assessment: No/denies pain  Therapy/Group: Individual Therapy  Carmelia Roller., CCC-SLP [phone]  Mora Appl 07/03/2013, 5:08 PM

## 2013-07-03 NOTE — Progress Notes (Signed)
Social Work Patient ID: [name], female   DOB: May 05, 1960, 53 y.o.   MRN: [id number] Spoke with daughter via telephone to see how she is doing after yesterday.  She is home and taking it easy, she reports having pain from her fall and hitting her head. She also scraped up her hand.  Her brother is on hi sway back from [place] today.  Daughter is aware how much care pt requires and is not sure if she can mange her. Discussed pursuing NHP until pt is higher level, daughter wants to talk with family and decide.  Pt's Mother is still living and is talking to pt's daughter ( her granddaughter). This worker feels without help from other family members it is too much for her alone to provide care to pt.  Daughter is only 84 years old-young herself.  Will talk with pt's son When he returns here today.  Pt taken off discharge board for tomorrow.

## 2013-07-03 NOTE — Progress Notes (Signed)
Subjective/Complaints: 53 y.o. female with history of HTN, DM, bipolar disorder, who was admitted on 05/27/13 with right facial droop and aphasia. 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 2 D echo with EF 55-60%. Carotid dopplers without significant ICA stenosis. 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 Patient with progressive neurologic worsening on 05/29/13 pm with RUE >RLE weakness. Started on hypertonic fluids and transferred to ICU for closer monitoring. Follow up CCT without significant changes. Patient with decrease in LOC with worsening of symptoms    Review of Systems - Negative except aphasic not able to consistently do Y/N Objective: Vital Signs: Blood pressure 162/97, pulse 95, temperature 98.8 F (37.1 C), temperature source Oral, resp. rate 18, weight 71.4 kg (157 lb 6.5 oz), SpO2 95.00%. No results found. Results for orders placed during the hospital encounter of 06/04/13 (from the past 72 hour(s))  GLUCOSE, CAPILLARY     Status: Abnormal   Collection Time    06/30/13  7:19 AM      Result Value Ref Range   Glucose-Capillary 131 (*) 70 - 99 mg/dL  GLUCOSE, CAPILLARY     Status: Abnormal   Collection Time    06/30/13 11:48 AM      Result Value Ref Range   Glucose-Capillary 181 (*) 70 - 99 mg/dL  GLUCOSE, CAPILLARY     Status: Abnormal   Collection Time    06/30/13  4:48 PM      Result Value Ref Range   Glucose-Capillary 150 (*) 70 - 99 mg/dL  GLUCOSE, CAPILLARY     Status: Abnormal   Collection Time    06/30/13  9:13 PM      Result Value Ref Range   Glucose-Capillary 151 (*) 70 - 99 mg/dL   GLUCOSE, CAPILLARY     Status: Abnormal   Collection Time    07/01/13  7:12 AM      Result Value Ref Range   Glucose-Capillary 151 (*) 70 - 99 mg/dL  GLUCOSE, CAPILLARY     Status: Abnormal   Collection Time    07/01/13 11:23 AM      Result Value Ref Range   Glucose-Capillary 184 (*) 70 - 99 mg/dL  GLUCOSE, CAPILLARY     Status: Abnormal   Collection Time    07/01/13  4:38 PM      Result Value Ref Range   Glucose-Capillary 118 (*) 70 - 99 mg/dL  GLUCOSE, CAPILLARY     Status: Abnormal   Collection Time    07/01/13  9:53 PM      Result Value Ref Range   Glucose-Capillary 156 (*) 70 - 99 mg/dL  GLUCOSE, CAPILLARY     Status: Abnormal   Collection Time    07/02/13  6:58 AM      Result Value Ref Range   Glucose-Capillary 121 (*) 70 - 99 mg/dL  GLUCOSE, CAPILLARY     Status: Abnormal   Collection Time    07/02/13  7:51 AM      Result Value Ref Range   Glucose-Capillary 114 (*) 70 - 99 mg/dL  GLUCOSE, CAPILLARY     Status: Abnormal   Collection Time  07/02/13 11:32 AM      Result Value Ref Range   Glucose-Capillary 171 (*) 70 - 99 mg/dL  GLUCOSE, CAPILLARY     Status: Abnormal   Collection Time    07/02/13  4:38 PM      Result Value Ref Range   Glucose-Capillary 162 (*) 70 - 99 mg/dL  GLUCOSE, CAPILLARY     Status: Abnormal   Collection Time    07/02/13  9:39 PM      Result Value Ref Range   Glucose-Capillary 120 (*) 70 - 99 mg/dL     HEENT: normal   Cardio: RRR and no murmur Resp: CTA B/L and unlabored GI: BS positive and non distended, G tube intact. Bumper ok.Non tender at ostomy No abnl drainage Extremity:  Pulses positive and No Edema Skin:   Intact Neuro: alert ,smiling, Cranial Nerve Abnormalities R central 7, Abnormal Sensory  Abnormal Motor 0/5 RUE and RLE except 3-R hip flexion,  knee flexion and  ext, normal on Left and Aphasic. 1- 1+ tone RE including bicep,wrist/fingers, hamstring/gastroc  MAS 2-3 R FF, Thumb flexors, WF, in WHO Musc/Skel:  Other no  pain with R shoulder ROM Gen NAD. Very alert   Assessment/Plan: 1. Functional deficits secondary to Left MCA infarct which require 3+ hours per day of interdisciplinary therapy in a comprehensive inpatient rehab setting. Stable for d/c after family ed  F/u PCP in 1-2 weeks F/u PM&R 3 weeks See D/C summary See D/C instructions  FIM: FIM - Bathing Bathing Steps Patient Completed: Chest;Right Arm;Abdomen;Front perineal area;Buttocks;Right upper leg;Left upper leg Bathing: 3: Mod-Patient completes 5-7 53f 10 parts or 50-74%  FIM - Upper Body Dressing/Undressing Upper body dressing/undressing steps patient completed: Thread/unthread left sleeve of pullover shirt/dress;Put head through opening of pull over shirt/dress;Pull shirt over trunk Upper body dressing/undressing: 4: Min-Patient completed 75 plus % of tasks FIM - Lower Body Dressing/Undressing Lower body dressing/undressing steps patient completed: Thread/unthread right underwear leg;Thread/unthread left underwear leg;Thread/unthread right pants leg;Thread/unthread left pants leg;Pull pants up/down;Pull underwear up/down Lower body dressing/undressing: 3: Mod-Patient completed 50-74% of tasks  FIM - Toileting Toileting steps completed by patient: Adjust clothing prior to toileting;Adjust clothing after toileting Toileting Assistive Devices: Grab bar or rail for support Toileting: 3: Mod-Patient completed 2 of 3 steps  FIM - Radio producer Devices: Recruitment consultant Transfers: 4-To toilet/BSC: Min A (steadying Pt. > 75%);4-From toilet/BSC: Min A (steadying Pt. > 75%)  FIM - Bed/Chair Transfer Bed/Chair Transfer Assistive Devices: Walker;Orthosis Bed/Chair Transfer: 4: Supine > Sit: Min A (steadying Pt. > 75%/lift 1 leg);4: Sit > Supine: Min A (steadying pt. > 75%/lift 1 leg);4: Bed > Chair or W/C: Min A (steadying Pt. > 75%);4: Chair or W/C > Bed: Min A (steadying Pt. > 75%)  FIM - Locomotion:  Wheelchair Distance: 50 Locomotion: Wheelchair: 2: Travels 50 - 149 ft with minimal assistance (Pt.>75%) FIM - Locomotion: Ambulation Locomotion: Ambulation Assistive Devices: Cane - Quad;Orthosis Ambulation/Gait Assistance: 4: Min assist Locomotion: Ambulation: 1: Travels less than 50 ft with minimal assistance (Pt.>75%)  Comprehension Comprehension Mode: Auditory Comprehension: 2-Understands basic 25 - 49% of the time/requires cueing 51 - 75% of the time  Expression Expression Mode: Verbal Expression: 1-Expresses basis less than 25% of the time/requires cueing greater than 75% of the time.  Social Interaction Social Interaction: 3-Interacts appropriately 50 - 74% of the time - May be physically or verbally inappropriate.  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 DVT Prophylaxis/Anticoagulation: Pharmaceutical: Lovenox  2. Pain Management: N/A  3. Bipolar disorder/Mood: No signs of distress reported but patient with poor awareness of deficits. Monitor mood for now. LCSW to follow with patient and family.  4. Neuropsych: This patient is not capable of making decisions on her own behalf.  5. DM type 2: Will monitor with bid checks. Continue to titrate insulin as needed---will change to coordinate with bolus feeding schedule. Use SSI for elevated BS.  6. HTN: adjust for better control avoid hypoperfusion/neuro worsening   Labetalol at max,not much effect from ACE I,  HCTZ, change to norvasc boost to 10mg  7. H/o paranoid schizophrenia: Has been to [facility] in the past due to decompensation. Not on medications as this time--has been on respiridone in the past and has history  of non-compliance per records  8. Dysphagia: s/p G-tube   -on bolus feeds   9. Resting Tachycardia:     improved    , Normodyne 300mg  VT TID starting 4/6, HR improved now in 80s monitor bradycardia 10. Hypertonicity:  scheduled baclofen low dose  -right WHO, increasing elbow flexor tone   LOS (Days) 29 A FACE TO FACE EVALUATION WAS PERFORMED  Charlett Blake 07/03/2013, 6:53 AM

## 2013-07-04 ENCOUNTER — Inpatient Hospital Stay (HOSPITAL_COMMUNITY): Payer: Medicaid Other | Admitting: Physical Therapy

## 2013-07-04 ENCOUNTER — Inpatient Hospital Stay (HOSPITAL_COMMUNITY): Payer: Medicaid Other | Admitting: Occupational Therapy

## 2013-07-04 ENCOUNTER — Inpatient Hospital Stay (HOSPITAL_COMMUNITY): Payer: Medicaid Other | Admitting: Speech Pathology

## 2013-07-04 DIAGNOSIS — F319 Bipolar disorder, unspecified: Secondary | ICD-10-CM

## 2013-07-04 DIAGNOSIS — E119 Type 2 diabetes mellitus without complications: Secondary | ICD-10-CM

## 2013-07-04 DIAGNOSIS — I10 Essential (primary) hypertension: Secondary | ICD-10-CM

## 2013-07-04 DIAGNOSIS — I6340 Cerebral infarction due to embolism of unspecified cerebral artery: Secondary | ICD-10-CM

## 2013-07-04 LAB — GLUCOSE, CAPILLARY
GLUCOSE-CAPILLARY: 112 mg/dL — AB (ref 70–99)
GLUCOSE-CAPILLARY: 135 mg/dL — AB (ref 70–99)
Glucose-Capillary: 177 mg/dL — ABNORMAL HIGH (ref 70–99)
Glucose-Capillary: 146 mg/dL — ABNORMAL HIGH (ref 70–99)

## 2013-07-04 MED ORDER — GABAPENTIN 250 MG/5ML PO SOLN
100.0000 mg | Freq: Three times a day (TID) | ORAL | Status: DC
  Administered 2013-07-04 – 2013-07-08 (×13): 100 mg
  Filled 2013-07-04 (×16): qty 2

## 2013-07-04 MED ORDER — FLUCONAZOLE 40 MG/ML PO SUSR
100.0000 mg | Freq: Every day | ORAL | Status: AC
  Administered 2013-07-04 – 2013-07-08 (×5): 100 mg via ORAL
  Filled 2013-07-04 (×7): qty 2.5

## 2013-07-04 MED ORDER — FREE WATER
200.0000 mL | Freq: Two times a day (BID) | Status: DC
  Administered 2013-07-05 – 2013-07-08 (×7): 200 mL

## 2013-07-04 MED ORDER — GLUCERNA 1.2 CAL PO LIQD
350.0000 mL | Freq: Every day | ORAL | Status: DC
  Administered 2013-07-04 – 2013-07-07 (×4): 350 mL
  Filled 2013-07-04 (×5): qty 474

## 2013-07-04 MED ORDER — GLUCERNA 1.2 CAL PO LIQD
350.0000 mL | Freq: Three times a day (TID) | ORAL | Status: DC | PRN
  Administered 2013-07-05 – 2013-07-07 (×5): 350 mL
  Filled 2013-07-04 (×2): qty 474

## 2013-07-04 NOTE — Progress Notes (Signed)
NUTRITION FOLLOW UP  Intervention:   1. Enteral nutrition; Continue Glucerna 1.2 350 mL 4 times daily based on intake at meals.  If pt consumes </=50% she should receiving bolus.  2.  Modify diet; resume PO diet once medically appropriate per MD discretion.  NUTRITION DIAGNOSIS:  Inadequate oral intake related to inability to eat, dysphagia as evidenced by SLP assessment.   Monitor:  1. Enteral nutrition; initiation with tolerance. Pt to meet >/=90% estimated needs with nutrition support. Met.  2. Wt/wt change; monitor trends. Ongoing.   Assessment:   53 y.o. female with history of HTN, DM, bipolar disorder, who was admitted on 05/27/13 with right facial droop and aphasia. 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   Pt continues to tolerate regimen well. Current regimen of 350 mL 4 times daily provides 1680 kcal, 84g protein, and 1135 mL free water which meets 93% estimated energy needs.   Pt with 200 mL fluid boluses 5 times daily for 2135 mL fluid/day.  Wt somewhat variable, but overall stable.  Discussed with SLP. Pt has started a Dysphagia 2 diet. 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   Pt orders, plans is to hold lunch time bolus on Fri 04/17 and Sat 04/18 for meal trial with ST. Check with ST--if patient eats less than 50% may administer lunctime feed. Unless pt is consuming >50% of meals consistently, recommend pt is to continue bolus feeds if intake is </=50%.  Height: Ht Readings from Last 1 Encounters:  05/27/13 5' 6"  (1.676 m)    Weight Status:   Wt Readings from Last 1 Encounters:  07/04/13 160 lb 4.4 oz (72.7 kg)   Re-estimated needs:   Kcal: 1800-1980 Protein: 70-85g Fluid: >1.8 L/day  Skin: intact  Diet Order: Dysphagia 2  No intake or output data in the 24 hours ending 07/04/13 1426  Last BM: 4/15  Labs:  No results found for this basename: NA, K, CL, CO2, BUN, CREATININE, CALCIUM, MG, PHOS, GLUCOSE,  in the last 168 hours  CBG (last 3)   Recent Labs  07/03/13 2133 07/04/13 0736 07/04/13 1207  GLUCAP 153* 112* 135*    Scheduled Meds: . amLODipine  10 mg Oral Daily  . antiseptic oral rinse  15 mL Mouth Rinse q12n4p  . aspirin  325 mg Per Tube Daily  . baclofen  5 mg Oral BID  . chlorhexidine  15 mL Mouth Rinse BID  . enoxaparin (LOVENOX) injection  30 mg Subcutaneous Q24H  . feeding supplement (GLUCERNA 1.2 CAL)  350 mL Per Tube QID  . fluconazole  100 mg Oral Daily  . free water  200 mL Per Tube 5 X Daily  . gabapentin  100 mg Per Tube 3 times per day  . hydrochlorothiazide  6.25 mg Oral Daily  . insulin aspart  0-15 Units Subcutaneous TID AC & HS  . insulin glargine  20 Units Subcutaneous BID  . labetalol  300 mg Per NG tube TID  . mupirocin ointment   Nasal BID  . simvastatin  20 mg Per Tube q1800    Continuous Infusions:    Brynda Greathouse, MS RD LDN Clinical Inpatient Dietitian Pager: [phone] Weekend/After hours pager: [phone]

## 2013-07-04 NOTE — Progress Notes (Signed)
Occupational Therapy Weekly Progress Note  Patient Details  Name: [name] MRN: [id number] Date of Birth: 1960/10/26  Beginning of progress report period: June 26, 2013 End of progress report period: July 04, 2013  Today's Date: 07/04/2013 Time: 3235-5732 Time Calculation (min): 55 min  Short term goals not set due to estimated length of stay.  Pt has progressed to min-mod assist transfers - min assist stand pivot and mod assist when ambulating with hemi-walker.  Pt continues to require steady assist and verbal cues for sequencing with self-care tasks. Pt LOS extended to allow further hands on family education with daughter who was planning to take pt home.  During a family education session, pt's daughter had a syncope episode resulting in collapse into floor in prone and treatment in ED.  Daughter is unsure if she can manage pt's care due to young age and lack of additional family support.  Plan to receive decision of home vs NHP.    Patient continues to demonstrate the following deficits: Rt hemiplegia with impaired motor control, timing and sequencing, apraxia, impaired postural control, balance, cognition and therefore will continue to benefit from skilled OT intervention to enhance overall performance with BADL and Reduce care partner burden.  See Patient's Care Plan for progression toward long term goals.  Patient progressing toward long term goals..  Continue plan of care.  Skilled Therapeutic Interventions/Progress Updates:    Engaged in ADL retraining with focus on transfers, standing balance, and hemi-technique with bathing and dressing.  Stand pivot bed > w/c > sidestepping into walk-in shower with cues for side stepping pattern and tactile cues at Rt knee for placement initially with sit > stand. Pt with increased sequencing and initiation with bathing at shower level, requires assist to cross RLE over Lt knee to wash foot and then when completing dressing tasks. Min/steady  assist in standing during perineal hygiene and when pulling pants over hips. Pt continues to require assist with threading RUE through bra and shirt due to mild/intermittent Rt inattention and inability to recall hemi-technique independently. Donned socks with pt requiring setup assist/backward chaining to initiate donning socks.  Incorporated RUE into self-care tasks with focus on placement in functional positions (on grab bar or sink) during bathing and dressing tasks to increase Rt attention/awareness.  Therapy Documentation Precautions:  Precautions Precautions: Fall Precaution Comments: right hemiperesis, NPO and maintain HOB >30 deg; MONITOR VITALS; Alert RN for BP >180/>105 and hold activity for BP 220/120, monitor for signs of aspiration or abdominal pain and distention Restrictions Weight Bearing Restrictions: No Pain:  Pt with no c/o pain  See FIM for current functional status  Therapy/Group: Individual Therapy  Holli Humbles Saifan Rayford 07/04/2013, 11:40 AM

## 2013-07-04 NOTE — Progress Notes (Signed)
Speech Language Pathology Weekly Progress and Session Note  Patient Details  Name: [name] MRN: [id number] Date of Birth: 1961/03/06  Beginning of progress report period: June 27, 2013 End of progress report period: July 04, 2013  Today's Date: 07/04/2013 Time: 1305-1400 Time Calculation (min): 55 min  Short Term Goals: Week 3: SLP Short Term Goal 1 (Week 3): Patient will identify object from a field of 2 with Mod multi-modal cues. SLP Short Term Goal 1 - Progress (Week 3): Met SLP Short Term Goal 2 (Week 3): Patient will approximate speech sounds during automatic speech tasks with Mod assist multi-modal cues. SLP Short Term Goal 2 - Progress (Week 3): Progressing toward goal SLP Short Term Goal 3 (Week 3): Patient will consume trilas of PO with SLP without need for oral suctioning and Min vebral cues for use of swallow strategies SLP Short Term Goal 3 - Progress (Week 3): Met SLP Short Term Goal 4 (Week 3): Patient will copy CVC word with Max assist multi-modal cues to identify moments of perseveration. SLP Short Term Goal 4 - Progress (Week 3): Progressing toward goal  New Short Term Goals: Week 4: SLP Short Term Goal 1 (Week 4): Patient will identify object from a field of 2 with Min multi-modal cues. SLP Short Term Goal 2 (Week 4): Patient will approximate speech sounds during automatic speech tasks with Max assist multi-modal cues. SLP Short Term Goal 3 (Week 4): Patient will consume Dys.2 textures and thin liquids with Min vebral cues for use of swallow strategies with minimal overt s/s of aspiration. SLP Short Term Goal 4 (Week 4): Patient will copy CVC word with Max assist multi-modal cues to identify moments of perseveration. SLP Short Term Goal 5 (Week 4): Patient will point to identify needs/wants from a field of 2 with Max cues.  Weekly Progress Updates: Patient has made gains during this reporting period in ability to transit boluses, initiate vocalizations and  accurately identify objects from a field of 2 and as a result, has met 2 of 4 short term goals.  Oral diet of Dys.2 textures and thin liquids was recently initiated.  Additionally, patient's aphasia and apraxia continue to impact her ability to phonate and communicate basic wants and needs.  Patient and family education needs to be completed. As a result, patient would benefit from continued skilled SLP intervention to maximize diet safety and overall functional communication in order to maximize her functional independence prior to discharge home with 24/7 family assist.  Intensity: Minumum of 1-2 x/day, 30 to 90 minutes Frequency: 5 out of 7 days Frequency due to:   Duration/Length of Stay: TBD Treatment/Interventions: Cognitive remediation/compensation;Cueing hierarchy;Dysphagia/aspiration precaution training;Environmental controls;Functional tasks;Internal/external aids;Multimodal communication approach;Patient/family education;Oral motor exercises;Speech/Language facilitation;Therapeutic Activities  Daily Session  Skilled Therapeutic Interventions: Skilled treatment session focused on addressing dysphagia goals.  SLP facilitated session with initiation of oral intake with a Dys.2 texture and thin liquid diet.  Patient required set-up assist and Mod verbal and visual cues for pacing and to self-monitor oral residue.  Patient utilized 1-3 swallows per bite/sip and demonstrated cough x1 due to large cup sip which strong reflexive cough appeared to clear.  Patient consumed 25% of meal.  PA, RN and nurse tech all educated on SLP recommendations.  During meal patient also utilized choice of 2 and non-verbal vocalizations in conjunction with head nods for yes/no responses with Max clinician cues.     FIM:  Comprehension Comprehension Mode: Auditory Comprehension: 2-Understands basic 25 - 49% of  the time/requires cueing 51 - 75% of the time Expression Expression Mode: Verbal Expression: 1-Expresses  basis less than 25% of the time/requires cueing greater than 75% of the time. Social Interaction Social Interaction: 3-Interacts appropriately 50 - 74% of the time - May be physically or verbally inappropriate. Problem Solving Problem Solving: 2-Solves basic 25 - 49% of the time - needs direction more than half the time to initiate, plan or complete simple activities Memory Memory: 3-Recognizes or recalls 50 - 74% of the time/requires cueing 25 - 49% of the time FIM - Eating Eating Activity: 4: Helper checks for pocketed food;5: Set-up assist for open containers;5: Needs verbal cues/supervision;5: Set-up assist for cut food General  Amount of Missed SLP Time (min): 5 Minutes Pain Pain Assessment Pain Assessment: No/denies pain  Therapy/Group: Individual Therapy  Carmelia Roller., Fortine  Mora Appl 07/04/2013, 4:26 PM

## 2013-07-04 NOTE — Progress Notes (Signed)
Subjective/Complaints: 53 y.o. female with history of HTN, DM, bipolar disorder, who was admitted on 05/27/13 with right facial droop and aphasia. 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 2 D echo with EF 55-60%. Carotid dopplers without significant ICA stenosis. 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 Patient with progressive neurologic worsening on 05/29/13 pm with RUE >RLE weakness. Started on hypertonic fluids and transferred to ICU for closer monitoring. Follow up CCT without significant changes. Patient with decrease in LOC with worsening of symptoms    Review of Systems - Negative except aphasic not able to consistently do Y/N Objective: Vital Signs: Blood pressure 163/94, pulse 100, temperature 98.1 F (36.7 C), temperature source Oral, resp. rate 19, weight 72.7 kg (160 lb 4.4 oz), SpO2 97.00%. No results found. Results for orders placed during the hospital encounter of 06/04/13 (from the past 72 hour(s))  GLUCOSE, CAPILLARY     Status: Abnormal   Collection Time    07/01/13 11:23 AM      Result Value Ref Range   Glucose-Capillary 184 (*) 70 - 99 mg/dL  GLUCOSE, CAPILLARY     Status: Abnormal   Collection Time    07/01/13  4:38 PM      Result Value Ref Range   Glucose-Capillary 118 (*) 70 - 99 mg/dL  GLUCOSE, CAPILLARY     Status: Abnormal   Collection Time    07/01/13  9:53 PM      Result Value Ref Range   Glucose-Capillary 156 (*) 70 - 99 mg/dL  GLUCOSE, CAPILLARY     Status: Abnormal   Collection Time    07/02/13  6:58 AM      Result Value Ref Range   Glucose-Capillary 121 (*) 70 - 99 mg/dL   GLUCOSE, CAPILLARY     Status: Abnormal   Collection Time    07/02/13  7:51 AM      Result Value Ref Range   Glucose-Capillary 114 (*) 70 - 99 mg/dL  GLUCOSE, CAPILLARY     Status: Abnormal   Collection Time    07/02/13 11:32 AM      Result Value Ref Range   Glucose-Capillary 171 (*) 70 - 99 mg/dL  GLUCOSE, CAPILLARY     Status: Abnormal   Collection Time    07/02/13  4:38 PM      Result Value Ref Range   Glucose-Capillary 162 (*) 70 - 99 mg/dL  GLUCOSE, CAPILLARY     Status: Abnormal   Collection Time    07/02/13  9:39 PM      Result Value Ref Range   Glucose-Capillary 120 (*) 70 - 99 mg/dL  GLUCOSE, CAPILLARY     Status: Abnormal   Collection Time    07/03/13  6:59 AM      Result Value Ref Range   Glucose-Capillary 123 (*) 70 - 99 mg/dL  GLUCOSE, CAPILLARY     Status: Abnormal   Collection Time    07/03/13 11:48 AM      Result Value Ref Range   Glucose-Capillary 193 (*) 70 - 99 mg/dL   Comment 1 Notify RN    GLUCOSE, CAPILLARY     Status: Abnormal  Collection Time    07/03/13  4:17 PM      Result Value Ref Range   Glucose-Capillary 115 (*) 70 - 99 mg/dL  GLUCOSE, CAPILLARY     Status: Abnormal   Collection Time    07/03/13  9:33 PM      Result Value Ref Range   Glucose-Capillary 153 (*) 70 - 99 mg/dL     HEENT: normal. Thick thrush on the tongue Cardio: RRR and no murmur Resp: CTA B/L and unlabored GI: BS positive and non distended, G tube intact. Bumper ok.Non tender at ostomy No abnl drainage Extremity:  Pulses positive and No Edema Skin:   Intact Neuro: alert ,smiling, Cranial Nerve Abnormalities R central 7, Abnormal Sensory  Abnormal Motor 0/5 RUE and RLE except 3-R hip flexion,  knee flexion and  ext, normal on Left and Aphasic. 1- 1+ tone RE including bicep,wrist/fingers, hamstring/gastroc  MAS 2-3 R FF, Thumb flexors, WF, in WHO Musc/Skel:  Other no pain with R shoulder ROM Gen NAD. Very alert   Assessment/Plan: 1. Functional deficits secondary to  Left MCA infarct which require 3+ hours per day of interdisciplinary therapy in a comprehensive inpatient rehab setting.   DC today F/u PCP in 1-2 weeks F/u PM&R 3 weeks See D/C summary See D/C instructions  FIM: FIM - Bathing Bathing Steps Patient Completed: Chest;Right Arm;Abdomen;Front perineal area;Buttocks;Right upper leg;Left upper leg Bathing: 3: Mod-Patient completes 5-7 34f 10 parts or 50-74%  FIM - Upper Body Dressing/Undressing Upper body dressing/undressing steps patient completed: Thread/unthread left sleeve of pullover shirt/dress;Put head through opening of pull over shirt/dress;Pull shirt over trunk Upper body dressing/undressing: 4: Min-Patient completed 75 plus % of tasks FIM - Lower Body Dressing/Undressing Lower body dressing/undressing steps patient completed: Thread/unthread right underwear leg;Thread/unthread left underwear leg;Thread/unthread right pants leg;Thread/unthread left pants leg;Pull pants up/down;Pull underwear up/down Lower body dressing/undressing: 3: Mod-Patient completed 50-74% of tasks  FIM - Toileting Toileting steps completed by patient: Adjust clothing prior to toileting;Adjust clothing after toileting Toileting Assistive Devices: Grab bar or rail for support Toileting: 3: Mod-Patient completed 2 of 3 steps  FIM - Radio producer Devices: Grab bars Toilet Transfers: 4-To toilet/BSC: Min A (steadying Pt. > 75%);4-From toilet/BSC: Min A (steadying Pt. > 75%)  FIM - Bed/Chair Transfer Bed/Chair Transfer Assistive Devices: Copy: 4: Supine > Sit: Min A (steadying Pt. > 75%/lift 1 leg);4: Bed > Chair or W/C: Min A (steadying Pt. > 75%);4: Chair or W/C > Bed: Min A (steadying Pt. > 75%)  FIM - Locomotion: Wheelchair Distance: 50 Locomotion: Wheelchair: 0: Activity did not occur FIM - Locomotion: Ambulation Locomotion: Ambulation Assistive Devices: Nurse, adult Ambulation/Gait Assistance: 4: Min  assist Locomotion: Ambulation: 1: Travels less than 50 ft with minimal assistance (Pt.>75%)  Comprehension Comprehension Mode: Auditory Comprehension: 2-Understands basic 25 - 49% of the time/requires cueing 51 - 75% of the time  Expression Expression Mode: Verbal Expression: 1-Expresses basis less than 25% of the time/requires cueing greater than 75% of the time.  Social Interaction Social Interaction: 3-Interacts appropriately 50 - 74% of the time - May be physically or verbally inappropriate.  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 DVT Prophylaxis/Anticoagulation: Pharmaceutical: Lovenox  2. Pain Management: add gabapentin for RUE stroke pain 3. Bipolar disorder/Mood: No signs of distress reported but patient with poor awareness of deficits. Monitor mood for now. LCSW to follow with patient and family.  4. Neuropsych: This patient is not capable of making decisions on her own behalf.  5. DM type 2: Will monitor with bid checks. Continue to titrate insulin as needed. SSI 6. HTN: adjust for better control avoid hypoperfusion/neuro worsening   Labetalol at max,  HCTZ, norvasc. Add nitrate? 7. H/o paranoid schizophrenia: Has been to [facility] in the past due to decompensation. Not on medications as this time--has been on respiridone in the past and has history of non-compliance per records  8. Dysphagia: s/p G-tube   -on bolus feeds   9. 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

## 2013-07-04 NOTE — Progress Notes (Signed)
Social Work Patient ID: [name], female   DOB: Jul 07, 1960, 52 y.o.   MRN: [id number] Spoke with daughter via telephone who feels better today but still her head hurts.  Her brother is coming into town today and she wants to talk with him about the plan. She is aware we need an answer regarding home versus NHP, so I can begin bed search.  Will prepare FL2 and await daughter's return call by end of the day today.

## 2013-07-04 NOTE — Progress Notes (Signed)
Physical Therapy Weekly Progress Note  Patient Details  Name: [name] MRN: [id number] Date of Birth: May 02, 1960  Beginning of progress report period: June 25, 2013 End of progress report period: July 04, 2013  Today's Date: 07/04/2013 Time: 5916-3846 Time Calculation (min): 64 min  Patient has continued to make good progress and has met 9 of 13 long term goals.  Short term goals not set for week 5 secondary to anticipated D/C next week home or to SNF.  Pt D/C initially set for today (after lengthening LOS following home evaluation and need for further family education and functional mobility training) but secondary to daughter's syncope episode on Wednesday and daughter's medical complications D/C has been delayed until daughter is stable and able to decide if she will be able to be the pt's caregiver at D/C.  If daughter is not medically able to take care of pt at D/C pt will require NHP.   Pt is currently min A for bed mobility, basic transfers, stair negotiation and gait.    Patient continues to demonstrate the following deficits: R hemiplegia with impaired motor control, timing, sequencing, apraxia, impaired postural control, balance, gait, impaired cognition and therefore will continue to benefit from skilled PT intervention to enhance overall performance with balance, postural control, ability to compensate for deficits, functional use of  right upper extremity and right lower extremity, attention, awareness and coordination.  See Patient's Care Plan for progression toward long term goals.  Patient progressing toward long term goals..  Continue plan of care.  Skilled Therapeutic Interventions/Progress Updates:   Pt resting in w/c.  Transitioned to gym in w/c total A and donned shoes and AFO.  Performed NMR; see below for details.  Performed gait training with focus on R and L lateral stepping x 12' each direction and 10' retro stepping (to assist with gait and pivot transfers) with  LUE support on hemi walker and min A with verbal, tactile and visual cues for sequencing and for facilitation of weight shifting and knee control on RLE to prevent genu recurvatum.  Also performed stair negotiation to simulate home entry/exit up/down 2 stairs x 2 reps with LUE support on R rail ascending laterally and descending forwards with step to sequence with verbal and tactile cues for sequence and min A for balance but no assistance needed to advance or stabilize RLE in stance.  Performed gait x 60' in controlled environment with L hemiwalker and min A with pt demonstrating improved initiation and advancement of RLE, step through gait pattern, improved trunk control but still required facilitation for R lateral weight shift and knee control to prevent genu recurvatum.  Back in room pt able to point to choose to transfer to bed with max cues.  Performed transfer to bed with hemi walker and sit > supine with min A.   Therapy Documentation Precautions:  Precautions Precautions: Fall Precaution Comments: right hemiperesis, NPO and maintain HOB >30 deg; MONITOR VITALS; Alert RN for BP >180/>105 and hold activity for BP 220/120, monitor for signs of aspiration or abdominal pain and distention Restrictions Weight Bearing Restrictions: No Vital Signs: Therapy Vitals Pulse Rate: 93 BP: 159/103 mmHg Patient Position, if appropriate: Sitting Pain: Pain Assessment Pain Assessment: No/denies pain Locomotion : Ambulation Ambulation/Gait Assistance: 4: Min assist  Other Treatments: Treatments Neuromuscular Facilitation: Right;Lower Extremity;Forced use;Activity to increase coordination;Activity to increase motor control;Activity to increase timing and sequencing;Activity to increase grading;Activity to increase sustained activation;Activity to increase lateral weight shifting;Activity to increase anterior-posterior weight shifting  for activation of isolated R knee flexion <> extension with R foot on  skate board in closed chain x 20 reps with min-mod A and verbal and tactile cues for activation.  Performed trunk control and R sided training sitting on therapy ball with +2 A for tactile cues at pelvis and trunk/head for grading of movement during pelvic anterior/posterior tilting, lateral tilting and weight shifting; also focused on trunk elongation during reaching with LUE and pushing against therapist's resistance to facilitate extension through RLE and activation of RLE during alternating R and LLE marching.    See FIM for current functional status  Therapy/Group: Individual Therapy  [name] 07/04/2013, 12:34 PM

## 2013-07-05 ENCOUNTER — Inpatient Hospital Stay (HOSPITAL_COMMUNITY): Payer: Medicaid Other | Admitting: Physical Therapy

## 2013-07-05 ENCOUNTER — Inpatient Hospital Stay (HOSPITAL_COMMUNITY): Payer: Medicaid Other | Admitting: Speech Pathology

## 2013-07-05 LAB — GLUCOSE, CAPILLARY
GLUCOSE-CAPILLARY: 195 mg/dL — AB (ref 70–99)
Glucose-Capillary: 200 mg/dL — ABNORMAL HIGH (ref 70–99)
Glucose-Capillary: 125 mg/dL — ABNORMAL HIGH (ref 70–99)

## 2013-07-05 NOTE — Progress Notes (Signed)
Occupational Therapy Session Note  Patient Details  Name: [name] MRN: [id number] Date of Birth: 1960/07/13  Today's Date: 07/05/2013 Time: 1130-1145 Time Calculation (min): 15 min  Skilled Therapeutic Interventions/Progress Updates: Patient seen in [place] group with focus on patient education, attention, safe swallowing and right UE attention, safety and gross assist.    Therapy Documentation Precautions:  Precautions Precautions: Fall Precaution Comments: right hemiperesis, NPO and maintain HOB >30 deg; MONITOR VITALS; Alert RN for BP >180/>105 and hold activity for BP 220/120, monitor for signs of aspiration or abdominal pain and distention Restrictions Weight Bearing Restrictions: No  Pain: Pain Assessment Pain Assessment: No/denies pain  See FIM for current functional status  Therapy/Group: Group Therapy  Herschell Dimes 07/05/2013, 4:52 PM

## 2013-07-05 NOTE — Progress Notes (Signed)
Subjective/Complaints: 53 y.o. female with history of HTN, DM, bipolar disorder, who was admitted on 05/27/13 with right facial droop and aphasia. 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 2 D echo with EF 55-60%. Carotid dopplers without significant ICA stenosis. 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 Patient with progressive neurologic worsening on 05/29/13 pm with RUE >RLE weakness. Started on hypertonic fluids and transferred to ICU for closer monitoring. Follow up CCT without significant changes. Patient with decrease in LOC with worsening of symptoms   Pt lying in bed comfortably.  Review of Systems - Negative except aphasic not able to consistently do Y/N Objective: Vital Signs: Blood pressure 163/95, pulse 94, temperature 98 F (36.7 C), temperature source Oral, resp. rate 20, weight 72.7 kg (160 lb 4.4 oz), SpO2 98.00%. No results found. Results for orders placed during the hospital encounter of 06/04/13 (from the past 72 hour(s))  GLUCOSE, CAPILLARY     Status: Abnormal   Collection Time    07/02/13  7:51 AM      Result Value Ref Range   Glucose-Capillary 114 (*) 70 - 99 mg/dL  GLUCOSE, CAPILLARY     Status: Abnormal   Collection Time    07/02/13 11:32 AM      Result Value Ref Range   Glucose-Capillary 171 (*) 70 - 99 mg/dL  GLUCOSE, CAPILLARY     Status: Abnormal   Collection Time    07/02/13  4:38 PM      Result Value Ref Range   Glucose-Capillary 162 (*) 70 - 99 mg/dL  GLUCOSE, CAPILLARY     Status: Abnormal   Collection Time    07/02/13  9:39 PM      Result Value Ref Range   Glucose-Capillary  120 (*) 70 - 99 mg/dL  GLUCOSE, CAPILLARY     Status: Abnormal   Collection Time    07/03/13  6:59 AM      Result Value Ref Range   Glucose-Capillary 123 (*) 70 - 99 mg/dL  GLUCOSE, CAPILLARY     Status: Abnormal   Collection Time    07/03/13 11:48 AM      Result Value Ref Range   Glucose-Capillary 193 (*) 70 - 99 mg/dL   Comment 1 Notify RN    GLUCOSE, CAPILLARY     Status: Abnormal   Collection Time    07/03/13  4:17 PM      Result Value Ref Range   Glucose-Capillary 115 (*) 70 - 99 mg/dL  GLUCOSE, CAPILLARY     Status: Abnormal   Collection Time    07/03/13  9:33 PM      Result Value Ref Range   Glucose-Capillary 153 (*) 70 - 99 mg/dL  GLUCOSE, CAPILLARY     Status: Abnormal   Collection Time    07/04/13  7:36 AM      Result Value Ref Range   Glucose-Capillary 112 (*) 70 - 99 mg/dL  GLUCOSE, CAPILLARY     Status: Abnormal   Collection Time    07/04/13 12:07 PM      Result Value Ref Range   Glucose-Capillary 135 (*) 70 - 99 mg/dL  GLUCOSE, CAPILLARY  Status: Abnormal   Collection Time    07/04/13  4:34 PM      Result Value Ref Range   Glucose-Capillary 177 (*) 70 - 99 mg/dL  GLUCOSE, CAPILLARY     Status: Abnormal   Collection Time    07/04/13  9:36 PM      Result Value Ref Range   Glucose-Capillary 146 (*) 70 - 99 mg/dL     HEENT: normal. Thick thrush on the tongue Cardio: RRR and no murmur Resp: CTA B/L and unlabored GI: BS positive and non distended, G tube intact. Bumper ok.Non tender at ostomy No abnl drainage Extremity:  Pulses positive and No Edema Skin:   Intact Neuro: alert ,smiling---Yes/no accuracy of 25-50%, Cranial Nerve Abnormalities R central 7, Abnormal Sensory  Abnormal Motor 0/5 RUE and RLE except 3-R hip flexion,  knee flexion and  ext, normal on Left and Aphasic. 1- 1+ tone RE including bicep,wrist/fingers, hamstring/gastroc  MAS 2-3 R FF, Thumb flexors, WF, in WHO Musc/Skel:  Other no pain with R shoulder ROM Gen NAD. Very  alert   Assessment/Plan: 1. Functional deficits secondary to Left MCA infarct which require 3+ hours per day of interdisciplinary therapy in a comprehensive inpatient rehab setting.   Now may be SNF placement   FIM: FIM - Bathing Bathing Steps Patient Completed: Chest;Right Arm;Abdomen;Front perineal area;Buttocks;Right upper leg;Left upper leg;Right lower leg (including foot);Left lower leg (including foot) Bathing: 4: Min-Patient completes 8-9 59f 10 parts or 75+ percent  FIM - Upper Body Dressing/Undressing Upper body dressing/undressing steps patient completed: Thread/unthread left sleeve of pullover shirt/dress;Put head through opening of pull over shirt/dress;Pull shirt over trunk;Thread/unthread left bra strap Upper body dressing/undressing: 3: Mod-Patient completed 50-74% of tasks FIM - Lower Body Dressing/Undressing Lower body dressing/undressing steps patient completed: Thread/unthread right underwear leg;Thread/unthread left underwear leg;Thread/unthread right pants leg;Thread/unthread left pants leg;Pull pants up/down;Pull underwear up/down Lower body dressing/undressing: 3: Mod-Patient completed 50-74% of tasks  FIM - Toileting Toileting steps completed by patient: Adjust clothing prior to toileting;Adjust clothing after toileting Toileting Assistive Devices: Grab bar or rail for support Toileting: 3: Mod-Patient completed 2 of 3 steps  FIM - Radio producer Devices: Grab bars Toilet Transfers: 4-To toilet/BSC: Min A (steadying Pt. > 75%);4-From toilet/BSC: Min A (steadying Pt. > 75%)  FIM - Bed/Chair Transfer Bed/Chair Transfer Assistive Devices: Copy: 4: Supine > Sit: Min A (steadying Pt. > 75%/lift 1 leg);4: Sit > Supine: Min A (steadying pt. > 75%/lift 1 leg);4: Bed > Chair or W/C: Min A (steadying Pt. > 75%);4: Chair or W/C > Bed: Min A (steadying Pt. > 75%)  FIM - Locomotion: Wheelchair Distance: 50 Locomotion:  Wheelchair: 1: Total Assistance/staff pushes wheelchair (Pt<25%) FIM - Locomotion: Ambulation Locomotion: Ambulation Assistive Devices: Cane - Quad;Orthosis Ambulation/Gait Assistance: 4: Min assist Locomotion: Ambulation: 2: Travels 50 - 149 ft with minimal assistance (Pt.>75%)  Comprehension Comprehension Mode: Auditory Comprehension: 2-Understands basic 25 - 49% of the time/requires cueing 51 - 75% of the time  Expression Expression Mode: Verbal Expression: 1-Expresses basis less than 25% of the time/requires cueing greater than 75% of the time.  Social Interaction Social Interaction: 3-Interacts appropriately 50 - 74% of the time - May be physically or verbally inappropriate.  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 DVT Prophylaxis/Anticoagulation: Pharmaceutical: Lovenox  2. Pain Management: add gabapentin for RUE stroke pain 3. Bipolar disorder/Mood: No signs of distress reported but patient with poor awareness of deficits. Monitor mood for now. LCSW to follow with patient and family.  4. Neuropsych: This patient is not capable of making decisions on her own behalf.  5. DM type 2: Will monitor with bid checks. Continue to titrate insulin as needed. SSI 6. HTN: adjust for better control avoid hypoperfusion/neuro worsening although DBP's still frequently >100  Labetalol at max,  HCTZ, norvasc. Consider low dose hydralazine 7. H/o paranoid schizophrenia: Has been to [facility] in the past due to decompensation. Not on medications as this time--has been on respiridone in the past and has history of non-compliance per records  8. Dysphagia: s/p G-tube   -on bolus  feeds   9. 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

## 2013-07-05 NOTE — Progress Notes (Signed)
Physical Therapy Session Note  Patient Details  Name: [name] MRN: [id number] Date of Birth: [birth year]  Today's Date: 07/05/2013 Time: 1400-1445 Time Calculation (min): 45 min  Short Term Goals: Week 1:  PT Short Term Goal 1 (Week 1): Pt will perform bed mobility to L and R with mod A and 75% cues for initiation and sequencing PT Short Term Goal 1 - Progress (Week 1): Met PT Short Term Goal 2 (Week 1): Pt will perform bed <> w/c transfers with mod A and 75% cues for initiation and sequencing PT Short Term Goal 2 - Progress (Week 1): Met PT Short Term Goal 3 (Week 1): Pt will perform w/c mobility x 50' on unit with L hemi technique and mod A with 75% cues for sequencing PT Short Term Goal 3 - Progress (Week 1): Partly met PT Short Term Goal 4 (Week 1): Pt will perform gait x 25' with LRAD and mod A  PT Short Term Goal 4 - Progress (Week 1): Partly met PT Short Term Goal 5 (Week 1): Pt will negotiate 3 stairs with one rail and max A and 75% cues for sequencing PT Short Term Goal 5 - Progress (Week 1): Partly met   Therapy Documentation Precautions:  Precautions Precautions: Fall Precaution Comments: right hemiperesis, NPO and maintain HOB >30 deg; MONITOR VITALS; Alert RN for BP >180/>105 and hold activity for BP 220/120, monitor for signs of aspiration or abdominal pain and distention Restrictions Weight Bearing Restrictions: No Pain: Pain Assessment Pain Assessment: No/denies pain  Therapeutic Activity:(15') Bed mobility supine to sit with S/Mod-Independent, scoot to EOB in sitting with S/Mod-I using rail, Transfers sit<->stand into hemiwalker with S/min-guard, bed<->w/c S/min-guard Gait Training:(15') using hemiwalker 1 x 75', 1 x 50' and 1 x 30' with S/min-assist. Occasional R toe not advancing with stepping effort and needing min-assist to maintain balance.  Stairs; up/down 4 steps using Left hand rail with S/min-assist. Car transfers with  S/Mod-Independent. Therapeutic Exercise:(15') B LE's in sitting with A/AAROM      Therapy/Group: Individual Therapy  Clearence Ped 07/05/2013, 2:04 PM

## 2013-07-05 NOTE — Progress Notes (Signed)
Speech Language Pathology Daily Session Note  Patient Details  Name: [name] MRN: [id number] Date of Birth: 11-17-1960  Today's Date: 07/05/2013 Time: 0160-1093 Time Calculation (min): 20 min  Short Term Goals: Week 4: SLP Short Term Goal 1 (Week 4): Patient will identify object from a field of 2 with Min multi-modal cues. SLP Short Term Goal 2 (Week 4): Patient will approximate speech sounds during automatic speech tasks with Max assist multi-modal cues. SLP Short Term Goal 3 (Week 4): Patient will consume Dys.2 textures and thin liquids with Min vebral cues for use of swallow strategies with minimal overt s/s of aspiration. SLP Short Term Goal 4 (Week 4): Patient will copy CVC word with Max assist multi-modal cues to identify moments of perseveration. SLP Short Term Goal 5 (Week 4): Patient will point to identify needs/wants from a field of 2 with Max cues.  Skilled Therapeutic Interventions: Skilled treatment session focused on addressing dysphagia goals. SLP facilitated session with set-up assist and Mod verbal and visual cues for pacing and to self-monitor oral residue and right sided anterior loss. Patient utilized 1-3 swallows per bite/sip and demonstrated manual removal of bolus x1 due to size and ineffective mastication.  Patient also exhibited cough x2 due to large cup sip which strong reflexive cough appeared to clear. Patient consumed ~25% of meal. Continue with current plan of care.   FIM:  Comprehension Comprehension Mode: Auditory Comprehension: 2-Understands basic 25 - 49% of the time/requires cueing 51 - 75% of the time Expression Expression Mode: Verbal Expression: 1-Expresses basis less than 25% of the time/requires cueing greater than 75% of the time. Social Interaction Social Interaction: 3-Interacts appropriately 50 - 74% of the time - May be physically or verbally inappropriate. Problem Solving Problem Solving: 2-Solves basic 25 - 49% of the time - needs  direction more than half the time to initiate, plan or complete simple activities Memory Memory: 3-Recognizes or recalls 50 - 74% of the time/requires cueing 25 - 49% of the time FIM - Eating Eating Activity: 4: Helper checks for pocketed food;5: Set-up assist for open containers;5: Needs verbal cues/supervision;5: Set-up assist for cut food  Pain Pain Assessment Pain Assessment: No/denies pain  Therapy/Group: Individual Therapy  Carmelia Roller., [place]  Mora Appl 07/05/2013, 1:01 PM

## 2013-07-06 ENCOUNTER — Ambulatory Visit (HOSPITAL_COMMUNITY): Payer: Self-pay

## 2013-07-06 LAB — GLUCOSE, CAPILLARY
GLUCOSE-CAPILLARY: 154 mg/dL — AB (ref 70–99)
Glucose-Capillary: 122 mg/dL — ABNORMAL HIGH (ref 70–99)
Glucose-Capillary: 80 mg/dL (ref 70–99)
Glucose-Capillary: 125 mg/dL — ABNORMAL HIGH (ref 70–99)
Glucose-Capillary: 183 mg/dL — ABNORMAL HIGH (ref 70–99)

## 2013-07-06 NOTE — Progress Notes (Signed)
Pt has consumed right at or slightly more than 50% of each meal today, no bolus feeds required. No issues noted during meals, following swallowing precautions as ordered. Incont of bladder several times, but also gestured to use BSC twice today. Continue with plan of care. Hortencia Conradi RN

## 2013-07-06 NOTE — Progress Notes (Signed)
Subjective/Complaints: 53 y.o. female with history of HTN, DM, bipolar disorder, who was admitted on 05/27/13 with right facial droop and aphasia. 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 2 D echo with EF 55-60%. Carotid dopplers without significant ICA stenosis. 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 Patient with progressive neurologic worsening on 05/29/13 pm with RUE >RLE weakness. Started on hypertonic fluids and transferred to ICU for closer monitoring. Follow up CCT without significant changes. Patient with decrease in LOC with worsening of symptoms   Lying in bed awake. No specific issues identified Review of Systems - Negative except aphasic not able to consistently do Y/N Objective: Vital Signs: Blood pressure 155/96, pulse 88, temperature 98.2 F (36.8 C), temperature source Oral, resp. rate 18, weight 72.6 kg (160 lb 0.9 oz), SpO2 96.00%. No results found. Results for orders placed during the hospital encounter of 06/04/13 (from the past 72 hour(s))  GLUCOSE, CAPILLARY     Status: Abnormal   Collection Time    07/03/13 11:48 AM      Result Value Ref Range   Glucose-Capillary 193 (*) 70 - 99 mg/dL   Comment 1 Notify RN    GLUCOSE, CAPILLARY     Status: Abnormal   Collection Time    07/03/13  4:17 PM      Result Value Ref Range   Glucose-Capillary 115 (*) 70 - 99 mg/dL  GLUCOSE, CAPILLARY     Status: Abnormal   Collection Time    07/03/13  9:33 PM      Result Value Ref Range   Glucose-Capillary 153 (*) 70 - 99 mg/dL  GLUCOSE, CAPILLARY     Status: Abnormal   Collection Time    07/04/13  7:36 AM      Result Value Ref Range   Glucose-Capillary 112 (*) 70 - 99 mg/dL  GLUCOSE, CAPILLARY     Status: Abnormal   Collection Time    07/04/13 12:07 PM      Result Value Ref Range   Glucose-Capillary 135 (*) 70 - 99 mg/dL  GLUCOSE, CAPILLARY     Status: Abnormal   Collection Time    07/04/13  4:34 PM      Result Value Ref Range   Glucose-Capillary 177 (*) 70 - 99 mg/dL  GLUCOSE, CAPILLARY     Status: Abnormal   Collection Time    07/04/13  9:36 PM      Result Value Ref Range   Glucose-Capillary 146 (*) 70 - 99 mg/dL  GLUCOSE, CAPILLARY     Status: Abnormal   Collection Time    07/05/13  6:43 AM      Result Value Ref Range   Glucose-Capillary 122 (*) 70 - 99 mg/dL  GLUCOSE, CAPILLARY     Status: Abnormal   Collection Time    07/05/13 11:27 AM      Result Value Ref Range   Glucose-Capillary 200 (*) 70 - 99 mg/dL   Comment 1 Notify RN    GLUCOSE, CAPILLARY     Status: Abnormal   Collection Time    07/05/13  4:27 PM      Result Value Ref Range   Glucose-Capillary 125 (*)  70 - 99 mg/dL   Comment 1 Notify RN    GLUCOSE, CAPILLARY     Status: Abnormal   Collection Time    07/05/13  9:08 PM      Result Value Ref Range   Glucose-Capillary 195 (*) 70 - 99 mg/dL     HEENT: normal. thrush Cardio: RRR and no murmur Resp: CTA B/L and unlabored GI: BS positive and non distended, G tube intact. Bumper ok.Non tender at ostomy No abnl drainage Extremity:  Pulses positive and No Edema Skin:   Intact Neuro: alert ,smiling---Yes/no accuracy of 25-50%, Cranial Nerve Abnormalities R central 7, Abnormal Sensory  Abnormal Motor 0/5 RUE and RLE except 3-R hip flexion,  knee flexion and  ext, normal on Left and Aphasic. 1- 1+ tone RE including bicep,wrist/fingers, hamstring/gastroc  MAS 2-3 R FF, Thumb flexors, WF, in WHO Musc/Skel:  Other no pain with R shoulder ROM Gen NAD. Very alert   Assessment/Plan: 1. Functional deficits secondary to Left MCA infarct which require 3+ hours per day of  interdisciplinary therapy in a comprehensive inpatient rehab setting.   Now may be SNF placement   FIM: FIM - Bathing Bathing Steps Patient Completed: Chest;Right Arm;Abdomen;Front perineal area;Buttocks;Right upper leg;Left upper leg;Right lower leg (including foot);Left lower leg (including foot) Bathing: 4: Min-Patient completes 8-9 30f 10 parts or 75+ percent  FIM - Upper Body Dressing/Undressing Upper body dressing/undressing steps patient completed: Thread/unthread left sleeve of pullover shirt/dress;Put head through opening of pull over shirt/dress;Pull shirt over trunk;Thread/unthread left bra strap Upper body dressing/undressing: 3: Mod-Patient completed 50-74% of tasks FIM - Lower Body Dressing/Undressing Lower body dressing/undressing steps patient completed: Thread/unthread right underwear leg;Thread/unthread left underwear leg;Thread/unthread right pants leg;Thread/unthread left pants leg;Pull pants up/down;Pull underwear up/down Lower body dressing/undressing: 3: Mod-Patient completed 50-74% of tasks  FIM - Toileting Toileting steps completed by patient: Adjust clothing prior to toileting;Adjust clothing after toileting Toileting Assistive Devices: Grab bar or rail for support Toileting: 3: Mod-Patient completed 2 of 3 steps  FIM - Radio producer Devices: Grab bars Toilet Transfers: 4-To toilet/BSC: Min A (steadying Pt. > 75%);4-From toilet/BSC: Min A (steadying Pt. > 75%)  FIM - Bed/Chair Transfer Bed/Chair Transfer Assistive Devices: Copy: 4: Supine > Sit: Min A (steadying Pt. > 75%/lift 1 leg);4: Sit > Supine: Min A (steadying pt. > 75%/lift 1 leg);4: Bed > Chair or W/C: Min A (steadying Pt. > 75%);4: Chair or W/C > Bed: Min A (steadying Pt. > 75%)  FIM - Locomotion: Wheelchair Distance: 50 Locomotion: Wheelchair: 1: Total Assistance/staff pushes wheelchair (Pt<25%) FIM - Locomotion: Ambulation Locomotion: Ambulation  Assistive Devices: Cane - Quad;Orthosis Ambulation/Gait Assistance: 4: Min assist Locomotion: Ambulation: 2: Travels 50 - 149 ft with minimal assistance (Pt.>75%)  Comprehension Comprehension Mode: Auditory Comprehension: 2-Understands basic 25 - 49% of the time/requires cueing 51 - 75% of the time  Expression Expression Mode: Verbal Expression: 1-Expresses basis less than 25% of the time/requires cueing greater than 75% of the time.  Social Interaction Social Interaction: 3-Interacts appropriately 50 - 74% of the time - May be physically or verbally inappropriate.  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 DVT Prophylaxis/Anticoagulation: Pharmaceutical: Lovenox  2. Pain Management: added gabapentin for RUE stroke/neuro pain with some benefit 3. Bipolar disorder/Mood: No signs of distress reported but patient with poor awareness of deficits. Monitor mood for now. LCSW to follow with patient and family.  4. Neuropsych: This patient is not capable of making decisions on her own behalf.  5. DM type 2: reasonable control. Continue to titrate insulin as needed. SSI 6. HTN: adjust for better control avoid hypoperfusion/neuro worsening although DBP's still frequently >100  Labetalol at max,  HCTZ, norvasc. Consider low dose hydralazine 7. H/o paranoid schizophrenia: Has been to [facility] in the past due to decompensation. Not on medications as this time--has been on respiridone in the past and has history of non-compliance per records  8. Dysphagia: s/p G-tube   -on bolus feeds  -diflucan for thrush   9. 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

## 2013-07-07 ENCOUNTER — Inpatient Hospital Stay (HOSPITAL_COMMUNITY): Payer: Medicaid Other | Admitting: Physical Therapy

## 2013-07-07 ENCOUNTER — Inpatient Hospital Stay (HOSPITAL_COMMUNITY): Payer: Medicaid Other | Admitting: Speech Pathology

## 2013-07-07 ENCOUNTER — Inpatient Hospital Stay (HOSPITAL_COMMUNITY): Payer: Medicaid Other | Admitting: Occupational Therapy

## 2013-07-07 DIAGNOSIS — I6340 Cerebral infarction due to embolism of unspecified cerebral artery: Secondary | ICD-10-CM

## 2013-07-07 DIAGNOSIS — F319 Bipolar disorder, unspecified: Secondary | ICD-10-CM

## 2013-07-07 DIAGNOSIS — I10 Essential (primary) hypertension: Secondary | ICD-10-CM

## 2013-07-07 DIAGNOSIS — E119 Type 2 diabetes mellitus without complications: Secondary | ICD-10-CM

## 2013-07-07 LAB — BASIC METABOLIC PANEL
BUN: 14 mg/dL (ref 6–23)
CALCIUM: 9.5 mg/dL (ref 8.4–10.5)
CO2: 26 mEq/L (ref 19–32)
CREATININE: 0.63 mg/dL (ref 0.50–1.10)
Chloride: 103 mEq/L (ref 96–112)
GFR calc Af Amer: >90 mL/min (ref >90)
GFR calc non Af Amer: >90 mL/min (ref >90)
GLUCOSE: 135 mg/dL — AB (ref 70–99)
Potassium: 3.9 mEq/L (ref 3.7–5.3)
Sodium: 141 mEq/L (ref 137–147)

## 2013-07-07 LAB — GLUCOSE, CAPILLARY
GLUCOSE-CAPILLARY: 207 mg/dL — AB (ref 70–99)
Glucose-Capillary: 173 mg/dL — ABNORMAL HIGH (ref 70–99)
Glucose-Capillary: 202 mg/dL — ABNORMAL HIGH (ref 70–99)
Glucose-Capillary: 97 mg/dL (ref 70–99)

## 2013-07-07 NOTE — Progress Notes (Signed)
Occupational Therapy Session Note  Patient Details  Name: [name] MRN: [id number] Date of Birth: December 15, 1960  Today's Date: 07/07/2013 Time: 1030-1130 Time Calculation (min): 60 min  Short Term Goals: Week 4:  OT Short Term Goal 1 (Week 4): LTGs = STGs due to remaining LOS  Skilled Therapeutic Interventions/Progress Updates:  Patient seated in w/c upon arrival.  Engaged in self care retraining to include shower, dress and groom.  Focused session on attention to right side of body, dynamic balance, non dominant LUE coordination, RUE as a stabilizer, apraxia, safe bathroom transfers, safely ambulating with hemi walker to and from shower which required min assist except on 2 occasions patient did not advance RLE and required mod assist to recover.  Patient required min-mod cues to attend to right side of body during functional mobility and for hemi bathing & dressing techniques.  Patient required steady assist of RLE once patient places it on left knee to wash, apply lotion, don socks, shoes, etc.  Allowed patient to attempt to continue self care routine established by patient and primary OT to include sitting at sink to perform grooming tasks.  Patient with occasional episodes of ideational apraxia such as when lip moisturizer presented to her, she attempted to eat it instead of put it on her lips.  Therapy Documentation Precautions:  Precautions Precautions: Fall Precaution Comments: right hemiperesis, NPO and maintain HOB >30 deg; MONITOR VITALS; Alert RN for BP >180/>105 and hold activity for BP 220/120, monitor for signs of aspiration or abdominal pain and distention Restrictions Weight Bearing Restrictions: No Pain: Pain Assessment Pain Assessment: No/denies pain ADL: See FIM for current functional status  Therapy/Group: Individual Therapy  Gaye Pollack 07/07/2013, 4:11 PM

## 2013-07-07 NOTE — Progress Notes (Signed)
Subjective/Complaints: 53 y.o. female with history of HTN, DM, bipolar disorder, who was admitted on 05/27/13 with right facial droop and aphasia. 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 2 D echo with EF 55-60%. Carotid dopplers without significant ICA stenosis. 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 Patient with progressive neurologic worsening on 05/29/13 pm with RUE >RLE weakness. Started on hypertonic fluids and transferred to ICU for closer monitoring. Follow up CCT without significant changes. Patient with decrease in LOC with worsening of symptoms   Lying in bed awake. No specific issues identified Review of Systems - Negative except aphasic not able to consistently do Y/N Objective: Vital Signs: Blood pressure 151/92, pulse 106, temperature 98.4 F (36.9 C), temperature source Oral, resp. rate 18, weight 71.9 kg (158 lb 8.2 oz), SpO2 98.00%. No results found. Results for orders placed during the hospital encounter of 06/04/13 (from the past 72 hour(s))  GLUCOSE, CAPILLARY     Status: Abnormal   Collection Time    07/04/13  7:36 AM      Result Value Ref Range   Glucose-Capillary 112 (*) 70 - 99 mg/dL  GLUCOSE, CAPILLARY     Status: Abnormal   Collection Time    07/04/13 12:07 PM      Result Value Ref Range   Glucose-Capillary 135 (*) 70 - 99 mg/dL  GLUCOSE, CAPILLARY     Status: Abnormal   Collection Time    07/04/13  4:34 PM      Result Value Ref Range   Glucose-Capillary 177 (*) 70 - 99 mg/dL  GLUCOSE, CAPILLARY     Status: Abnormal   Collection Time    07/04/13  9:36 PM      Result Value Ref  Range   Glucose-Capillary 146 (*) 70 - 99 mg/dL  GLUCOSE, CAPILLARY     Status: Abnormal   Collection Time    07/05/13  6:43 AM      Result Value Ref Range   Glucose-Capillary 122 (*) 70 - 99 mg/dL  GLUCOSE, CAPILLARY     Status: Abnormal   Collection Time    07/05/13 11:27 AM      Result Value Ref Range   Glucose-Capillary 200 (*) 70 - 99 mg/dL   Comment 1 Notify RN    GLUCOSE, CAPILLARY     Status: Abnormal   Collection Time    07/05/13  4:27 PM      Result Value Ref Range   Glucose-Capillary 125 (*) 70 - 99 mg/dL   Comment 1 Notify RN    GLUCOSE, CAPILLARY     Status: Abnormal   Collection Time    07/05/13  9:08 PM      Result Value Ref Range   Glucose-Capillary 195 (*) 70 - 99 mg/dL  GLUCOSE, CAPILLARY     Status: None   Collection Time    07/06/13  7:12 AM      Result Value Ref Range   Glucose-Capillary 80  70 - 99 mg/dL   Comment 1 Notify RN    GLUCOSE, CAPILLARY     Status: Abnormal   Collection Time    07/06/13 11:25 AM  Result Value Ref Range   Glucose-Capillary 154 (*) 70 - 99 mg/dL   Comment 1 Notify RN    GLUCOSE, CAPILLARY     Status: Abnormal   Collection Time    07/06/13  4:21 PM      Result Value Ref Range   Glucose-Capillary 125 (*) 70 - 99 mg/dL  GLUCOSE, CAPILLARY     Status: Abnormal   Collection Time    07/06/13  8:38 PM      Result Value Ref Range   Glucose-Capillary 183 (*) 70 - 99 mg/dL  BASIC METABOLIC PANEL     Status: Abnormal   Collection Time    07/07/13  4:35 AM      Result Value Ref Range   Sodium 141  137 - 147 mEq/L   Potassium 3.9  3.7 - 5.3 mEq/L   Chloride 103  96 - 112 mEq/L   CO2 26  19 - 32 mEq/L   Glucose, Bld 135 (*) 70 - 99 mg/dL   BUN 14  6 - 23 mg/dL   Creatinine, Ser 9.84  0.50 - 1.10 mg/dL   Calcium 9.5  8.4 - 82.1 mg/dL   GFR calc non Af Amer >90  >90 mL/min   GFR calc Af Amer >90  >90 mL/min   Comment: (NOTE)     The eGFR has been calculated using the CKD EPI equation.     This calculation has not been  validated in all clinical situations.     eGFR's persistently <90 mL/min signify possible Chronic Kidney     Disease.     HEENT: normal. thrush Cardio: RRR and no murmur Resp: CTA B/L and unlabored GI: BS positive and non distended, G tube intact. Bumper ok.Non tender at ostomy No abnl drainage Extremity:  Pulses positive and No Edema Skin:   Intact Neuro: alert ,smiling---Yes/no accuracy of 25-50%, Cranial Nerve Abnormalities R central 7, Abnormal Sensory  Abnormal Motor 0/5 RUE and RLE except 3-R hip flexion,  knee flexion and  ext, normal on Left and Aphasic. 1- 1+ tone RE including bicep,wrist/fingers, hamstring/gastroc  MAS 2-3 R FF, Thumb flexors, WF, in WHO Musc/Skel:  Other no pain with R shoulder ROM Gen NAD. Very alert   Assessment/Plan: 1. Functional deficits secondary to Left MCA infarct which require 3+ hours per day of interdisciplinary therapy in a comprehensive inpatient rehab setting.   Now may be SNF placement   FIM: FIM - Bathing Bathing Steps Patient Completed: Chest;Right Arm;Abdomen;Front perineal area;Buttocks;Right upper leg;Left upper leg;Right lower leg (including foot);Left lower leg (including foot) Bathing: 4: Min-Patient completes 8-9 60f 10 parts or 75+ percent  FIM - Upper Body Dressing/Undressing Upper body dressing/undressing steps patient completed: Thread/unthread left sleeve of pullover shirt/dress;Put head through opening of pull over shirt/dress;Pull shirt over trunk;Thread/unthread left bra strap Upper body dressing/undressing: 3: Mod-Patient completed 50-74% of tasks FIM - Lower Body Dressing/Undressing Lower body dressing/undressing steps patient completed: Thread/unthread right underwear leg;Thread/unthread left underwear leg;Thread/unthread right pants leg;Thread/unthread left pants leg;Pull pants up/down;Pull underwear up/down Lower body dressing/undressing: 3: Mod-Patient completed 50-74% of tasks  FIM - Toileting Toileting steps  completed by patient: Performs perineal hygiene Toileting Assistive Devices: Grab bar or rail for support Toileting: 2: Max-Patient completed 1 of 3 steps  FIM - Diplomatic Services operational officer Devices: Psychiatrist Transfers: 4-To toilet/BSC: Min A (steadying Pt. > 75%);4-From toilet/BSC: Min A (steadying Pt. > 75%)  FIM - Bed/Chair Transfer Bed/Chair Transfer Assistive Devices: Therapist, occupational:  4: Supine > Sit: Min A (steadying Pt. > 75%/lift 1 leg);4: Sit > Supine: Min A (steadying pt. > 75%/lift 1 leg);4: Bed > Chair or W/C: Min A (steadying Pt. > 75%);4: Chair or W/C > Bed: Min A (steadying Pt. > 75%)  FIM - Locomotion: Wheelchair Distance: 50 Locomotion: Wheelchair: 1: Total Assistance/staff pushes wheelchair (Pt<25%) FIM - Locomotion: Ambulation Locomotion: Ambulation Assistive Devices: Cane - Quad;Orthosis Ambulation/Gait Assistance: 4: Min assist Locomotion: Ambulation: 2: Travels 50 - 149 ft with minimal assistance (Pt.>75%)  Comprehension Comprehension Mode: Auditory Comprehension: 2-Understands basic 25 - 49% of the time/requires cueing 51 - 75% of the time  Expression Expression Mode: Verbal Expression: 1-Expresses basis less than 25% of the time/requires cueing greater than 75% of the time.  Social Interaction Social Interaction: 3-Interacts appropriately 50 - 74% of the time - May be physically or verbally inappropriate.  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 DVT Prophylaxis/Anticoagulation: Pharmaceutical: Lovenox  2. Pain Management: added gabapentin for RUE stroke/neuro pain with some benefit 3. Bipolar  disorder/Mood: No signs of distress reported but patient with poor awareness of deficits. Monitor mood for now. LCSW to follow with patient and family.  4. Neuropsych: This patient is not capable of making decisions on her own behalf.  5. DM type 2: reasonable control. Continue to titrate insulin as needed. SSI 6. HTN: adjust for better control avoid hypoperfusion, DBP's under 100 Labetalol at max,  HCTZ, norvasc. Consider low dose hydralazine 7. H/o paranoid schizophrenia: Has been to [facility] in the past due to decompensation. Not on medications as this time--has been on respiridone in the past and has history of non-compliance per records  8. Dysphagia: s/p G-tube   -on bolus feeds  -diflucan for thrush   9. 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

## 2013-07-07 NOTE — Progress Notes (Signed)
Speech Language Pathology Daily Session Note  Patient Details  Name: [name] MRN: [id number] Date of Birth: 14-Dec-1960  Today's Date: 07/07/2013 Time: 0830-0910 Time Calculation (min): 40 min  Short Term Goals: Week 4: SLP Short Term Goal 1 (Week 4): Patient will identify object from a field of 2 with Min multi-modal cues. SLP Short Term Goal 2 (Week 4): Patient will approximate speech sounds during automatic speech tasks with Max assist multi-modal cues. SLP Short Term Goal 3 (Week 4): Patient will consume Dys.2 textures and thin liquids with Min vebral cues for use of swallow strategies with minimal overt s/s of aspiration. SLP Short Term Goal 4 (Week 4): Patient will copy CVC word with Max assist multi-modal cues to identify moments of perseveration. SLP Short Term Goal 5 (Week 4): Patient will point to identify needs/wants from a field of 2 with Max cues.  Skilled Therapeutic Interventions: Skilled therapeutic intervention addressed dysphagia and linguistic goals. Student provided Mod multimodal cues for pacing and self-monitoring of oral residue; patient utilized strategy of 1-3 swallows per bit/sip with supervision. Patient with strong cough x2 after consuming Dys. 2 textures. Suspect reflexive cough strong enough to clear. Patient consumed ~10% of meal. Patient required Min question cues to identify common objects from a field of 2 with 70% accuracy and to correctly match written word to object 2/3 opportunities. Patient expressed need to go to the bathroom via gestures, however patient required Total A to utilize call bell. Continue with current plan of care.    FIM:  Comprehension Comprehension Mode: Auditory Comprehension: 3-Understands basic 50 - 74% of the time/requires cueing 25 - 50%  of the time Expression Expression Mode: Verbal Expression: 1-Expresses basis less than 25% of the time/requires cueing greater than 75% of the time. Problem Solving Problem Solving:  2-Solves basic 25 - 49% of the time - needs direction more than half the time to initiate, plan or complete simple activities Memory Memory: 3-Recognizes or recalls 50 - 74% of the time/requires cueing 25 - 49% of the time FIM - Eating Eating Activity: 5: Needs verbal cues/supervision  Pain Pain Assessment Pain Assessment: No/denies pain Pain Score: 0-No pain  Therapy/Group: Individual Therapy  Jalene Mullet 07/07/2013, 10:13 AM

## 2013-07-07 NOTE — Progress Notes (Signed)
Physical Therapy Session Note  Patient Details  Name: [name] MRN: [id number] Date of Birth: [birth year]  Today's Date: 07/07/2013 Time: 0928-1028 Time Calculation (min): 60 min  Short Term Goals: Week 4:  PT Short Term Goal 1 (Week 4): = LTG of min A overall PT Short Term Goal 1 - Progress (Week 4): Updated due to goal met  Skilled Therapeutic Interventions/Progress Updates:    Pt received seated in w/c; agreeable to therapy. Donned R AFO, shoes while seated. Pre-treatment vitals within parameters specified in MD orders; see vital signs for detailed readings. Pt performed gait x 62' in controlled environment with hemi walker with min A, step-to gait pattern; manual facilitation at R axilla, L ribs for upright posture. Negotiated 3 steps with single rail with step-to pattern and mod A; ascended laterally with LUE support at R rail, descended forward-facing with LUE support L rail; manual facilitation provided for bilat weight shifting, manual assist at RLE for R foot clearance during descent; tactile cueing at RLE for sequencing during descent, manual facilitation for upright posture as described above. Returned to pt room via w/c mobility x160' in controlled environment with L hemi technique, supervision and subtle to min cueing for obstacle negotiation on R side. Doffed R AFO. When asked if pt needed to change brief (per pt gesturing), pt nodded head "yes". Therefore, pt performed sit<>stand from w/c, static standing x3.5 minutes with LUE support at sink while RN performed brief change, PT provided min guard-min A for stability/balance, manual stabilization at L knee to prevent genu recurvatum. Therapist departed with pt seated in w/c with quick release belt on for safety and all needs within reach.  Therapy Documentation Precautions:  Precautions Precautions: Fall Precaution Comments: right hemiperesis, NPO and maintain HOB >30 deg; MONITOR VITALS; Alert RN for BP >180/>105 and hold  activity for BP 220/120, monitor for signs of aspiration or abdominal pain and distention Restrictions Weight Bearing Restrictions: No Vital Signs: Therapy Vitals Pulse Rate: 101 BP: 166/102 mmHg Patient Position, if appropriate: Sitting (Resting; pre-tx) Oxygen Therapy SpO2: 98 % O2 Device: None (Room air) Pain: Pain Assessment Pain Assessment: No/denies pain  See FIM for current functional status  Therapy/Group: Individual Therapy  Malva Cogan [place] 07/07/2013, 12:56 PM

## 2013-07-07 NOTE — Progress Notes (Signed)
Social Work Patient ID: [name], female   DOB: 1960/06/16, 53 y.o.   MRN: [id number] Daughter to come in tomorrow at 10;00 to finish up family education with team.  RN aware if more education needed can discuss then.

## 2013-07-07 NOTE — Progress Notes (Signed)
Physical Therapy Session Note  Patient Details  Name: [name] MRN: [id number] Date of Birth: 02/25/1961  Today's Date: 07/07/2013 Time: 4944-9675 Time Calculation (min): 30 min  Short Term Goals: Week 3:  PT Short Term Goal 1 (Week 3): Pt will perform bed mobility to L and R with min A and 25% cues for initiation and sequencing PT Short Term Goal 1 - Progress (Week 3): Met PT Short Term Goal 2 (Week 3): Pt will perform bed < w/c transfers with min A and 25% cues for initiation and sequencing PT Short Term Goal 2 - Progress (Week 3): Met PT Short Term Goal 3 (Week 3): Pt will perform w/c mobility x 150' on unit with L hemi technique with SBA and 25% cues for sequencing PT Short Term Goal 3 - Progress (Week 3): Progressing toward goal PT Short Term Goal 4 (Week 3): Pt will perform gait x 25' with LRAD and mod A PT Short Term Goal 4 - Progress (Week 3): Met PT Short Term Goal 5 (Week 3): Pt will negotiate 3 stairs with one rail and max A and 50% cues for sequencing PT Short Term Goal 5 - Progress (Week 3): Met Week 4:  PT Short Term Goal 1 (Week 4): = LTG of min A overall PT Short Term Goal 1 - Progress (Week 4): Updated due to goal met  Skilled Therapeutic Interventions/Progress Updates:   Pt resting in bed; lowered HOB and rail and cued pt to sit EOB to assess recall and use of supine >sit strategy.  Pt continues to require min A and verbal cues to attend to RUE and to bring RUE across midline while rolling to assist with supine > sit.  Performed transfer bed <> w/c stand pivot with and without UE support on hemi walker with min A and no verbal cues needed for sequencing.  Performed w/c mobility x 100' with L foot propulsion and supervision with verbal cues to attend to obstacles on R side.  Reviewed simulated car transfer and performed stand pivot with hemi walker w/c <> car with min A.  Pt able to place RLE into car but required min A to bring RLE out of car.  Performed higher level  gait training with obstacle course.  Pt educated on sequence with hemi walker for stepping over low obstacles, weaving L and R around cones and for one step/curb negotiation.  Pt gave repeat demonstration x 25' x 2 beginning with min A and mod verbal cues for safety and sequencing; on return trip pt required total verbal cues for safety and sequencing and pt neglecting to place hemi walker on step (keeping it on the floor beside her) during negotiation and neglecting to bring hemi walker around cones (lifting it up over cone) and neglecting to turn completely to the R to ambulate around the cone.   Returned to room and to bed with min A.  Transferred sit > supine with previously learned technique and supervision.      Therapy Documentation Precautions:  Precautions Precautions: Fall Precaution Comments: right hemiperesis, NPO and maintain HOB >30 deg; MONITOR VITALS; Alert RN for BP >180/>105 and hold activity for BP 220/120, monitor for signs of aspiration or abdominal pain and distention Restrictions Weight Bearing Restrictions: No Vital Signs: Therapy Vitals Pulse Rate: 103 BP: 146/93 mmHg Pain: Pain Assessment Pain Assessment: No/denies pain  See FIM for current functional status  Therapy/Group: Individual Therapy  [name] 07/07/2013, 2:49 PM

## 2013-07-07 NOTE — Progress Notes (Signed)
The skilled treatment note has been reviewed and SLP is in agreement.  Emon Miggins, M.A., CCC-SLP  [phone]   

## 2013-07-07 NOTE — Progress Notes (Signed)
Social Work Patient ID: [name], female   DOB: 10/11/1960, 53 y.o.   MRN: [id number] Spoke with daughter and she and her brother have decided to take pt home and provide care.  Aware of her need for 24 hour care and daughter feels somehow  She will do it and hopes her brother will help her.  Aware discharge will be tomorrow and feels her education regarding Mom's needs is complete.

## 2013-07-08 ENCOUNTER — Encounter (HOSPITAL_COMMUNITY): Payer: Self-pay | Admitting: Occupational Therapy

## 2013-07-08 ENCOUNTER — Inpatient Hospital Stay (HOSPITAL_COMMUNITY): Payer: Medicaid Other | Admitting: Speech Pathology

## 2013-07-08 ENCOUNTER — Inpatient Hospital Stay (HOSPITAL_COMMUNITY): Payer: Medicaid Other | Admitting: Physical Therapy

## 2013-07-08 DIAGNOSIS — E119 Type 2 diabetes mellitus without complications: Secondary | ICD-10-CM

## 2013-07-08 DIAGNOSIS — I6340 Cerebral infarction due to embolism of unspecified cerebral artery: Secondary | ICD-10-CM

## 2013-07-08 DIAGNOSIS — F319 Bipolar disorder, unspecified: Secondary | ICD-10-CM

## 2013-07-08 DIAGNOSIS — I10 Essential (primary) hypertension: Secondary | ICD-10-CM

## 2013-07-08 LAB — GLUCOSE, CAPILLARY
GLUCOSE-CAPILLARY: 142 mg/dL — AB (ref 70–99)
GLUCOSE-CAPILLARY: 195 mg/dL — AB (ref 70–99)
Glucose-Capillary: 139 mg/dL — ABNORMAL HIGH (ref 70–99)

## 2013-07-08 MED ORDER — BACLOFEN 10 MG PO TABS: 5.0000 mg | ORAL_TABLET | Freq: Two times a day (BID) | ORAL | Status: DC

## 2013-07-08 MED ORDER — AMLODIPINE BESYLATE 10 MG PO TABS: 10.0000 mg | ORAL_TABLET | Freq: Every day | ORAL | Status: DC

## 2013-07-08 MED ORDER — HYDROCHLOROTHIAZIDE 12.5 MG PO TABS: 12.5000 mg | ORAL_TABLET | Freq: Every day | ORAL | Status: DC

## 2013-07-08 MED ORDER — ACETAMINOPHEN 325 MG PO TABS: 325.0000 mg | ORAL_TABLET | ORAL | Status: AC | PRN

## 2013-07-08 MED ORDER — CHLORHEXIDINE GLUCONATE 0.12 % MT SOLN: 15.0000 mL | Freq: Two times a day (BID) | OROMUCOSAL | Status: DC

## 2013-07-08 MED ORDER — FREE WATER: 200.0000 mL | Freq: Two times a day (BID) | Status: DC

## 2013-07-08 MED ORDER — GLUCERNA 1.2 CAL PO LIQD: 350.0000 mL | Freq: Every day | ORAL | Status: DC

## 2013-07-08 MED ORDER — ASPIRIN 325 MG PO TABS: 325.0000 mg | ORAL_TABLET | Freq: Every day | ORAL | Status: DC

## 2013-07-08 MED ORDER — LABETALOL HCL 300 MG PO TABS: 300.0000 mg | ORAL_TABLET | Freq: Three times a day (TID) | ORAL | Status: DC

## 2013-07-08 MED ORDER — INSULIN GLARGINE 100 UNIT/ML SOLOSTAR PEN: 20.0000 [IU] | PEN_INJECTOR | Freq: Two times a day (BID) | SUBCUTANEOUS | Status: DC

## 2013-07-08 MED ORDER — SIMVASTATIN 20 MG PO TABS: 20.0000 mg | ORAL_TABLET | Freq: Every day | ORAL | Status: DC

## 2013-07-08 MED ORDER — GABAPENTIN 100 MG PO CAPS: 100.0000 mg | ORAL_CAPSULE | Freq: Three times a day (TID) | ORAL | Status: DC

## 2013-07-08 MED ORDER — GLUCERNA 1.2 CAL PO LIQD: 350.0000 mL | Freq: Three times a day (TID) | ORAL | Status: DC | PRN

## 2013-07-08 NOTE — Progress Notes (Signed)
Speech Language Pathology Discharge Summary  Patient Details  Name: [name] MRN: [id number] Date of Birth: 12/01/1960  Today's Date: 07/08/2013 Time: 1010-1100 Time Calculation (min): 50 min  Skilled Therapeutic Interventions:  Skilled treatment session focused on addressing family education prior to discharge.  Daughter, son and daughter-in-law present for session.  SLP incorporated verbal instruction, handouts and teach back opportunities throughout session.  Daughter engaged in session with appropriate questions related to dysphagia and communication.  SLP provided with home program recommendations: object id from filed of 2 (previously demonstrated), 1-step directions and humming/singing and phone calls for voicing. Daughter provided with handout for Dys.2 textures and educated on s/s of aspiration.  SLP also facilitated session with demonstration of set-up assist and proper cues for PO intake; son reported being surprised that she could feed herself.  Daughter actively particiapted in intake of trials and patient exhibited cough x1.    Patient has met 4 of 4 long term goals.  Patient to discharge at overall Mod;Max level.  Reasons goals not met: n/a   Clinical Impression/Discharge Summary: Patient has made gains during her rehab stay due to gains in swallow function, receptive language, vocal initiation and overall cognition.  Patient progressed from NPO to a Dys.2 texture diet with thin liquids and tube feeds via PEG for adequate nutritional support.  Patient made gains in ability to initiate swallows, transit boluses, initiate vocalizations with yes/no head nods with use of gestures to express needs and accurately identifies objects from a field of 2.  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  Patient and family education was completed.  Given continued deficits patient would benefit from  skilled SLP intervention to upgrade diet and maximize overall functional communication and independence.  Care Partner:  Caregiver Able to Provide Assistance: Yes  Type of Caregiver Assistance: Physical;Cognitive (swallow, linguistic)  Recommendation:  24 hour supervision/assistance;Home Health SLP;Outpatient SLP  Rationale for SLP Follow Up: Maximize functional communication;Maximize cognitive function and independence;Maximize swallowing safety;Reduce caregiver burden   Equipment: none   Reasons for discharge: Treatment goals met   Patient/Family Agrees with Progress Made and Goals Achieved: Yes   See FIM for current functional status   , M.A., CCC-SLP [phone]   M  07/08/2013, 2:48 PM    

## 2013-07-08 NOTE — Discharge Instructions (Addendum)
Inpatient Rehab Discharge Instructions  [name] Discharge date and time: 07/08/13   Activities/Precautions/ Functional Status: Activity: Needs 24 hours physical care. Cannot be left alone Diet: Chopped foods. Thin liquids. Need full supervision during meals. One bite/sip at a time--NO distraction/NO TV. Check to make sure mouth  Is empty before next bite. No straws.  Wound Care: Keep area clean and dry.  Functional status:  ___ No restrictions     ___ Walk up steps independently _X__ 24/7 supervision/assistance   ___ Walk up steps with assistance ___ Intermittent supervision/assistance  ___ Bathe/dress independently ___ Walk with walker     _X__ Bathe/dress with assistance ___ Walk Independently    ___ Shower independently ___ Walk with assistance    ___ Shower with assistance _X__ No alcohol     ___ Return to work/school ________  Special Instructions: 1. Toilet every 4 hours while awake--monitor for restlessness/discomfort.  2. Wash around PEG tube with soap and water, pat dry and apply dry dressing. Keep tube pinned to pants/shirt to avoid being pulled on.  3. Contact MD if patient develops fever, chills, redness or drainage around the tube or changes in Mental status or physical status.  4. Check blood sugars before meals and record. ([employer] brand machine has reasonably priced strips.)   COMMUNITY REFERRALS UPON DISCHARGE:    Home Health:   PT, OT, SPT, RN, [place] NUUVO:[phone] Date of last service:07/08/2013   Medical Equipment/Items Ordered:WHEELCHAIR, TUB BENCH, HEMI-WALKER, BSC, TUBE FEEDINGS-GLUCERNA  Agency/Supplier:[facility]    [phone] Other:DISABILITY AND MEDICAID APPLICATIONS PENDING-[facility]-4/30 10;30 AM FOR MEDICAL FOLLOW UP  GENERAL COMMUNITY RESOURCES FOR PATIENT/FAMILY: Support Groups:[facility]: CENTER  [phone]   STROKE/TIA DISCHARGE INSTRUCTIONS SMOKING Cigarette smoking nearly doubles your risk of having a stroke & is the single most alterable risk factor  If you smoke or have smoked in the last 12 months, you are advised to quit smoking for your health.  Most of the excess cardiovascular risk related to smoking disappears within a year of stopping.  Ask you doctor about anti-smoking medications  [place] Quit Line: 1-800-QUIT NOW  Free Smoking Cessation Classes [phone]  CHOLESTEROL Know your levels; limit fat & cholesterol in your diet  Lipid Panel     Component Value Date/Time   CHOL 201* 05/28/2013 0300   TRIG 145 05/28/2013 0300   HDL 42 05/28/2013 0300   CHOLHDL 4.8 05/28/2013 0300   VLDL 29 05/28/2013 0300   LDLCALC 130* 05/28/2013 0300      Many patients benefit from treatment even if their cholesterol is at goal.  Goal: Total Cholesterol (CHOL) less than 160  Goal:  Triglycerides (TRIG) less than 150  Goal:  HDL greater than 40  Goal:  LDL (LDLCALC) less than 100   BLOOD PRESSURE American Stroke Association blood pressure target is less that 120/80 mm/Hg  Your discharge blood pressure is:  BP: 157/103 mmHg  Monitor your blood pressure  Limit your salt and alcohol intake  Many individuals will require more than one medication for high blood pressure  DIABETES (A1c is a blood sugar average for last 3 months) Goal HGBA1c is under 7% (HBGA1c is blood sugar average for last 3 months)  Diabetes:     Lab Results  Component Value Date   HGBA1C 14.1* 05/30/2013     Your HGBA1c can be lowered with medications, healthy diet, and exercise.  Check your blood sugar as directed by your physician  Call your physician if you experience unexplained or low blood sugars.  PHYSICAL ACTIVITY/REHABILITATION Goal is 30 minutes at least 4 days per week  Activity: No driving, Therapies: See above Return to work:  N/A  Activity decreases your risk of heart attack and  stroke and makes your heart stronger.  It helps control your weight and blood pressure; helps you relax and can improve your mood.  Participate in a regular exercise program.  Talk with your doctor about the best form of exercise for you (dancing, walking, swimming, cycling).  DIET/WEIGHT Goal is to maintain a healthy weight  Your discharge diet is: Dysphagia 2 (chopped) thin liquids Your height is:  5'6" Your current weight is: Weight: 72.3 kg (159 lb 6.3 oz) Your Body Mass Index (BMI) is:  154.6 lbs  Following the type of diet specifically designed for you will help prevent another stroke.  Your goal weight range is:  154.6 lbs  Your goal Body Mass Index (BMI) is 19-24.  Healthy food habits can help reduce 3 risk factors for stroke:  High cholesterol, hypertension, and excess weight.  RESOURCES Stroke/Support Group:  Call [phone]   STROKE EDUCATION PROVIDED/REVIEWED AND GIVEN TO PATIENT Stroke warning signs and symptoms How to activate emergency medical system (call 911). Medications prescribed at discharge. Need for follow-up after discharge. Personal risk factors for stroke. Pneumonia vaccine given:  Flu vaccine given:  My questions have been answered, the writing is legible, and I understand these instructions.  I will adhere to these goals & educational materials that have been provided to me after my discharge from the hospital.      My questions have been answered and I understand these instructions. I will adhere to these goals and the provided educational materials after my discharge from the hospital.  Patient/Caregiver Signature _______________________________ Date __________  Clinician Signature _______________________________________ Date __________  Please bring this form and your medication list with you to all your follow-up doctor's appointments.

## 2013-07-08 NOTE — Plan of Care (Signed)
Problem: RH KNOWLEDGE DEFICIT Goal: RH STG INCREASE KNOWLEDGE OF DIABETES Mod Assist ; family or caregiver will be able to explain CMM diet/dm monitor and management interventions. Caregiver will be able to draw up an administer insulin  Outcome: Completed/Met Date Met:  07/08/13 daughter     

## 2013-07-08 NOTE — Progress Notes (Addendum)
Patient and daughter received written and verbal discharge instructions from Algis Liming, [place]. Patient's daughter also verbalized understanding of instructions, reviewed with daughter steps for peg tube bolus feeds and water flushes  and medication administration via peg, insulin administration with pen and syringe, blood glucose monitoring, per daughter patient has been on insulin in past, patient and daughter completed therapy family education.Patient's daughter able to return demonstrate 1400 medication administration and tube feeding bolus and water flush without difficulty. Personal belongings packed by patient's family. Charletha Dalpe Selinda Eon [place]

## 2013-07-08 NOTE — Progress Notes (Signed)
Occupational Therapy Discharge Summary  Patient Details  Name: [name] MRN: [id number] Date of Birth: [birth year]  Today's Date: 07/08/2013 Time: 1100-1140 Time Calculation (min): 40 min  Patient has met 10 of 10 long term goals due to improved activity tolerance, improved balance, postural control, ability to compensate for deficits, functional use of  RIGHT upper and RIGHT lower extremity, improved attention and improved awareness.  Patient to discharge at [facility] Assist level.  Patient's care partner is independent to provide the necessary physical and cognitive assistance at discharge.    Skilled clinical intervention:  Patient seen this am with son, daughter in law, and daughter present.  Patient already dressed, and declined shower.  Reviewed with family the process for safe transfer to tub bench, and toilet.  Practiced hands on training with daughter and son.  Both able to effectively transfer patient safely.  Reviewed bathing and dressing techniques to increase independence with self care skills.  Daughter very aware of patient's need for increased time and initiating tasks with patient.  Daughter very effective interacting with this patient.    Reasons goals not met: NA  Recommendation:  Patient will benefit from ongoing skilled OT services in home health setting to continue to advance functional skills in the area of BADL.  Equipment: Tub transfer bench, bedside commode  Reasons for discharge: treatment goals met and discharge from hospital  Patient/family agrees with progress made and goals achieved: Yes  OT Discharge Precautions/Restrictions   Fall risk, right hemiplegia, aphasia, apraxia   Pain Pain Assessment Pain Assessment: 0-10 Pain Score: 0-No pain Patients Stated Pain Goal: 3 Multiple Pain Sites: No   Vision/Perception    Mild right inattention Cognition  Selective attention appears intact; impulsive related to mobility Sensation  See initial  eval Motor   right hemiplegia    Trunk/Postural Assessment    Grossly WFL, posterior rotation of pelvis in sitting, slight kyphotic posture Balance  Limited weight acceptance on right side in standing, relies heavily on hemi walker.      See FIM for current functional status  Mariah Milling 07/08/2013, 11:45 AM

## 2013-07-08 NOTE — Plan of Care (Signed)
Problem: RH KNOWLEDGE DEFICIT Goal: RH STG INCREASE KNOWLEDGE OF HYPERTENSION Mod Assist ; family and caregiver will be able to explain interventions for management of HTN, low salt low fat diet and medications administered for BP control  Outcome: Completed/Met Date Met:  07/08/13 daughter

## 2013-07-08 NOTE — Progress Notes (Signed)
Physical Therapy Discharge Summary  Patient Details  Name: [name] MRN: [id number] Date of Birth: [birth year]  Today's Date: 07/08/2013 Time: 1250-1340 Time Calculation (min): 50 min  Patient has met 13 of 13 long term goals due to improved activity tolerance, improved balance, improved postural control, increased strength, ability to compensate for deficits, functional use of  right lower extremity, improved attention, improved awareness and improved coordination.  Patient to discharge at an ambulatory level [place] with hemi walker and supervision for w/c mobility.   Patient's care partner is independent to provide the necessary physical, cognitive and supervision assistance at discharge.  Reasons goals not met: All goals met  Recommendation:  Patient will benefit from ongoing skilled PT services in home health setting to continue to advance safe functional mobility, address ongoing impairments in R sided hemiplegia, apraxia, aphasia, impaired motor control, timing/sequencing, impaired postural control, balance, gait, and minimize fall risk.  Equipment: Hemi walker and manual w/c with half lap tray and cushion  Reasons for discharge: treatment goals met and discharge from hospital  Patient/family agrees with progress made and goals achieved: Yes  PT Discharge Final PT session with focus on family education and functional mobility training prior to D/C home today with daughter and son.  Daughter and son present.  Performed re-assessment of pt strength, sensation and postural control/balance.  Also assessed pt equipment received and alerted SW that manual w/c did not come with cushion or R half lap tray; SW to contact DME providers.  Pt hemi walker adjusted to proper height for gait.  Family and pt re-instructed on safe transfer sequences for stand pivot w/c <> simulated car, w/c <> bed, supine <> sit on flat bed, no rail, stair negotiation sequence and bumping a w/c up/down one  step for home entry and exit (see below for details of each).  Pt and family gave repeat demonstration of each sequence shown.  Also instructed family on w/c break down and set up to prepare for transfers.  Family verbalized and demonstrated understanding and had no other questions or concerns.   Pain Pain Assessment Pain Assessment: No/denies pain Vision/Perception  Perception Inattention/Neglect:  (improved from eval) Praxis Praxis: Impaired Praxis Impairment Details: Initiation;Ideomotor;Motor planning;Perseveration  Cognition Overall Cognitive Status: Impaired/Different from baseline Arousal/Alertness: Awake/alert Orientation Level: Other (comment);Oriented to person;Disoriented to place;Disoriented to time;Disoriented to situation Attention: Sustained;Selective Sustained Attention: Appears intact Selective Attention: Impaired Selective Attention Impairment: Functional basic Memory: Appears intact (with basic tasks ) Awareness Impairment: Emergent impairment Problem Solving: Impaired Problem Solving Impairment: Functional basic Executive Function: Sequencing;Decision Making;Reasoning;Organizing;Self Monitoring;Self Correcting Reasoning: Impaired Reasoning Impairment: Functional basic Sequencing: Impaired Sequencing Impairment: Functional basic Organizing: Impaired Organizing Impairment: Functional basic Decision Making: Impaired Decision Making Impairment: Functional basic Initiating: Impaired Initiating Impairment: Verbal basic Self Monitoring: Impaired Self Monitoring Impairment: Functional basic Self Correcting: Impaired Self Correcting Impairment: Functional basic Behaviors: Perseveration Safety/Judgment: Impaired Sensation Sensation Light Touch: Impaired by gross assessment Stereognosis: Not tested Hot/Cold: Not tested Proprioception: Impaired by gross assessment Coordination Gross Motor Movements are Fluid and Coordinated: No Fine Motor Movements are Fluid and  Coordinated: No Coordination and Movement Description: impaired by hemiplegia; pt does have active movement and strength in RLE but impaired initiation and motor planning; flaccid RUE Motor  Motor Motor: Hemiplegia;Abnormal tone;Motor apraxia;Abnormal postural alignment and control;Motor perseverations Motor - Skilled Clinical Observations: R hemiplegia, hypotonia RUE, impaired motor planning and sequencing, in sitting and standing presents with L shoulder elevation and rib flaring to L  Mobility Bed Mobility Bed  Mobility: Supine to Sit;Sit to Supine Supine to Sit: 4: Min assist Sit to Supine: 4: Min assist Sit to Supine - Details (indicate cue type and reason): On flat bed, no rails pt peforms supine <> sit with supervision-min A with verbal cues to initiate sit > supine hooking LLE under RLE to bring onto bed and during supine > sit requires verbal cues to bring RUE across midline while rolling supine > side Transfers Transfers: Yes Stand Pivot Transfers: 4: Min assist Stand Pivot Transfer Details (indicate cue type and reason): Min A for stand pivot transfers bed <> w/c, w/c <> recliner and w/c <> car with LUE support on hemi walker with min A needed for balance while pivoting and intermittent verbal cues for safe sequencing Locomotion  Ambulation Ambulation/Gait Assistance: 4: Min assist Ambulation Distance (Feet): 50 Feet Ambulation/Gait Assistance Details: With min A and hemi walker with intermittent verbal cues for sequencing and obstacle negotiation Gait Gait: Yes Gait Pattern: Impaired Gait Pattern: Step-through pattern;Decreased step length - right;Decreased step length - left;Decreased stance time - right;Decreased stride length;Decreased hip/knee flexion - right;Decreased dorsiflexion - right;Decreased weight shift to right;Right genu recurvatum;Decreased trunk rotation;Narrow base of support;Lateral trunk lean to left;Trunk rotated posteriorly on right Stairs / Additional  Locomotion Stairs: Yes Stairs Assistance: 4: Min assist Stairs Assistance Details (indicate cue type and reason): Min A for balance and verbal cues needed for safe sequence ascending laterally and descending forwards Stair Management Technique: One rail Right;Step to pattern;Sideways;Forwards Number of Stairs: 3 Height of Stairs: 6 Wheelchair Mobility Wheelchair Mobility: Yes Wheelchair Assistance: 5: Careers information officer: Left lower extremity Wheelchair Parts Management: Needs assistance Distance: 150  Trunk/Postural Assessment  Cervical Assessment Cervical Assessment: Within Functional Limits Thoracic Assessment Thoracic Assessment: Exceptions to WFL (slight kyphosis and rib flaring on L) Lumbar Assessment Lumbar Assessment: Exceptions to [facility] (decreased lordosis; keeps R pelvis elevated in sitting) Postural Control Postural Control: Deficits on evaluation (absent balance strategies)  Balance Static Sitting Balance Static Sitting - Balance Support: Left upper extremity supported;Feet supported Static Sitting - Level of Assistance: 5: Stand by assistance Dynamic Sitting Balance Dynamic Sitting - Balance Support: Feet supported;Left upper extremity supported Dynamic Sitting - Level of Assistance: 5: Stand by assistance Static Standing Balance Static Standing - Balance Support: Left upper extremity supported Static Standing - Level of Assistance: 4: Min assist Dynamic Standing Balance Dynamic Standing - Balance Support: Left upper extremity supported Dynamic Standing - Level of Assistance: 4: Min assist Extremity Assessment  RLE Assessment RLE Assessment: Exceptions to [facility] RLE Strength RLE Overall Strength: Deficits RLE Overall Strength Comments: hip flexion 2/5, knee extension 1/5, knee flexion 2/5, ankle DF 0/5 LLE Assessment LLE Assessment: Within Functional Limits  See FIM for current functional status  [name] 07/08/2013, 1:45 PM

## 2013-07-08 NOTE — Progress Notes (Signed)
Social Work Discharge Note Discharge Note  The overall goal for the admission was met for:   Discharge location: Yes-HOME WITH DAUGHTER WHO WILL PROVIDE 24 HR CARE  Length of Stay: Yes-34 DAYS  Discharge activity level: Yes-MIN LEVEL  Home/community participation: Yes  Services provided included: MD, RD, PT, OT, SLP, RN, CM, TR, Pharmacy and SW  Financial Services: Other: PENDING MEDICAID  Follow-up services arranged: Home Health: [facility]-PT,OT,RN,SPT,SW, DME: [facility]-WHEELCHAIR,BSC,TUB BENCH,TUBE FEEDINGS, HEMI-WALKER and Patient/Family has no preference for HH/DME agencies  Comments (or additional information):FAMILY EDUCATION COMPLETED AND ALTHOUGH PT IS [place].  DAUGHTER HAS HEALTH ISSUES AND DOES NOT HAVE MUCH HELP-IF ANY AT HOME VIA HER FAMILY MEMBERS BUT DOES NOT WANT NHP AND WILL TAKE HOME AND SEE HOW IT GOES. SET UP [place] AND DAUGHTER TO CONTINUE SSD AND MEDICAID PROCESS. SON WAS HERE FOR EDUCATION TODAY BUT DID NOT PAY ATTENTION, TOO WORRIED ABOUT HIS CELL PHONE.  Patient/Family verbalized understanding of follow-up arrangements: Yes  Individual responsible for coordination of the follow-up plan: [facility]  Confirmed correct DME delivered: Elease Hashimoto 07/08/2013    [name] Zadia Uhde

## 2013-07-08 NOTE — Progress Notes (Signed)
Subjective/Complaints: 53 y.o. female with history of HTN, DM, bipolar disorder, who was admitted on 05/27/13 with right facial droop and aphasia. 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 2 D echo with EF 55-60%. Carotid dopplers without significant ICA stenosis. 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 Patient with progressive neurologic worsening on 05/29/13 pm with RUE >RLE weakness. Started on hypertonic fluids and transferred to ICU for closer monitoring. Follow up CCT without significant changes. Patient with decrease in LOC with worsening of symptoms   Lying in bed awake. No specific issues identified Review of Systems - Negative except aphasic not able to consistently do Y/N Objective: Vital Signs: Blood pressure 176/129, pulse 103, temperature 98 F (36.7 C), temperature source Oral, resp. rate 17, weight 72.3 kg (159 lb 6.3 oz), SpO2 97.00%. No results found. Results for orders placed during the hospital encounter of 06/04/13 (from the past 72 hour(s))  GLUCOSE, CAPILLARY     Status: Abnormal   Collection Time    07/05/13 11:27 AM      Result Value Ref Range   Glucose-Capillary 200 (*) 70 - 99 mg/dL   Comment 1 Notify RN    GLUCOSE, CAPILLARY     Status: Abnormal   Collection Time    07/05/13  4:27 PM      Result Value Ref Range   Glucose-Capillary 125 (*) 70 - 99 mg/dL   Comment 1 Notify RN    GLUCOSE, CAPILLARY     Status: Abnormal   Collection Time    07/05/13  9:08 PM      Result Value Ref Range   Glucose-Capillary 195 (*) 70 - 99 mg/dL  GLUCOSE, CAPILLARY     Status: None   Collection Time     07/06/13  7:12 AM      Result Value Ref Range   Glucose-Capillary 80  70 - 99 mg/dL   Comment 1 Notify RN    GLUCOSE, CAPILLARY     Status: Abnormal   Collection Time    07/06/13 11:25 AM      Result Value Ref Range   Glucose-Capillary 154 (*) 70 - 99 mg/dL   Comment 1 Notify RN    GLUCOSE, CAPILLARY     Status: Abnormal   Collection Time    07/06/13  4:21 PM      Result Value Ref Range   Glucose-Capillary 125 (*) 70 - 99 mg/dL  GLUCOSE, CAPILLARY     Status: Abnormal   Collection Time    07/06/13  8:38 PM      Result Value Ref Range   Glucose-Capillary 183 (*) 70 - 99 mg/dL  BASIC METABOLIC PANEL     Status: Abnormal   Collection Time    07/07/13  4:35 AM      Result Value Ref Range   Sodium 141  137 - 147 mEq/L   Potassium 3.9  3.7 - 5.3 mEq/L   Chloride 103  96 - 112 mEq/L   CO2 26  19 - 32 mEq/L   Glucose, Bld 135 (*) 70 - 99 mg/dL   BUN 14  6 - 23 mg/dL   Creatinine, Ser 0.63  0.50 - 1.10 mg/dL   Calcium 9.5  8.4 - 10.5 mg/dL   GFR calc non Af Amer >90  >90 mL/min   GFR calc Af Amer >90  >90 mL/min   Comment: (NOTE)     The eGFR has been calculated using the CKD EPI equation.     This calculation has not been validated in all clinical situations.     eGFR's persistently <90 mL/min signify possible Chronic Kidney     Disease.  GLUCOSE, CAPILLARY     Status: Abnormal   Collection Time    07/07/13  7:29 AM      Result Value Ref Range   Glucose-Capillary 173 (*) 70 - 99 mg/dL   Comment 1 Notify RN    GLUCOSE, CAPILLARY     Status: Abnormal   Collection Time    07/07/13 11:35 AM      Result Value Ref Range   Glucose-Capillary 202 (*) 70 - 99 mg/dL   Comment 1 Notify RN    GLUCOSE, CAPILLARY     Status: None   Collection Time    07/07/13  4:32 PM      Result Value Ref Range   Glucose-Capillary 97  70 - 99 mg/dL  GLUCOSE, CAPILLARY     Status: Abnormal   Collection Time    07/07/13  9:28 PM      Result Value Ref Range   Glucose-Capillary 207 (*) 70 - 99 mg/dL   GLUCOSE, CAPILLARY     Status: Abnormal   Collection Time    07/08/13  6:44 AM      Result Value Ref Range   Glucose-Capillary 142 (*) 70 - 99 mg/dL     HEENT: normal. thrush Cardio: RRR and no murmur Resp: CTA B/L and unlabored GI: BS positive and non distended, G tube intact. Bumper ok.Non tender at ostomy No abnl drainage Extremity:  Pulses positive and No Edema Skin:   Intact Neuro: alert ,smiling---Yes/no accuracy of 25-50%, Cranial Nerve Abnormalities R central 7, Abnormal Sensory  Abnormal Motor 0/5 RUE and RLE except 3-R hip flexion,  knee flexion and  ext, normal on Left and Aphasic. 1- 1+ tone RE including bicep,wrist/fingers, hamstring/gastroc  MAS 2-3 R FF, Thumb flexors, WF, in WHO Musc/Skel:  Other no pain with R shoulder ROM Gen NAD. Very alert   Assessment/Plan: 1. Functional deficits secondary to Left MCA infarct which require 3+ hours per day of interdisciplinary therapy in a comprehensive inpatient rehab setting.  Stable for D/C if BPs  Get back to baseline    FIM: FIM - Bathing Bathing Steps Patient Completed: Chest;Right Arm;Abdomen;Front perineal area;Buttocks;Right upper leg;Left upper leg;Right lower leg (including foot);Left lower leg (including foot) Bathing: 4: Min-Patient completes 8-9 [id number] parts or 75+ percent  FIM - Upper Body Dressing/Undressing Upper body dressing/undressing steps patient completed: Thread/unthread left sleeve of pullover shirt/dress;Put head through opening of pull over shirt/dress;Pull shirt over trunk;Thread/unthread left bra strap;Thread/unthread right bra strap Upper body dressing/undressing: 3: Mod-Patient completed 50-74% of tasks FIM - Lower Body Dressing/Undressing Lower body dressing/undressing steps patient completed: Thread/unthread right pants leg;Thread/unthread left pants leg;Pull pants up/down;Don/Doff right sock;Don/Doff left sock (patient wore a brief today) Lower body dressing/undressing: 3: Mod-Patient  completed 50-74% of tasks  FIM - Toileting Toileting steps completed by patient: Performs perineal hygiene;Adjust clothing prior to toileting Toileting Assistive Devices: Grab bar or rail for support Toileting: 3: Mod-Patient completed 2 of 3 steps  FIM - TRadio producerDevices: Grab  bars;Walker;Elevated toilet seat Toilet Transfers: 4-To toilet/BSC: Min A (steadying Pt. > 75%);4-From toilet/BSC: Min A (steadying Pt. > 75%)  FIM - Bed/Chair Transfer Bed/Chair Transfer Assistive Devices: Walker;Orthosis (hemi walker, R AFO) Bed/Chair Transfer: 4: Bed > Chair or W/C: Min A (steadying Pt. > 75%);4: Chair or W/C > Bed: Min A (steadying Pt. > 75%)  FIM - Locomotion: Wheelchair Distance: 160 Locomotion: Wheelchair: 5: Travels 150 ft or more: maneuvers on rugs and over door sills with supervision, cueing or coaxing FIM - Locomotion: Ambulation Locomotion: Ambulation Assistive Devices: Orthosis;Walker - Hemi (R AFO) Ambulation/Gait Assistance: 4: Min assist Locomotion: Ambulation: 2: Travels 50 - 149 ft with minimal assistance (Pt.>75%)  Comprehension Comprehension Mode: Auditory Comprehension: 3-Understands basic 50 - 74% of the time/requires cueing 25 - 50%  of the time  Expression Expression Mode: Verbal Expression: 1-Expresses basis less than 25% of the time/requires cueing greater than 75% of the time.  Social Interaction Social Interaction: 3-Interacts appropriately 50 - 74% of the time - May be physically or verbally inappropriate.  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 DVT Prophylaxis/Anticoagulation: Pharmaceutical: Lovenox   2. Pain Management: added gabapentin for RUE stroke/neuro pain with some benefit 3. Bipolar disorder/Mood: No signs of distress reported but patient with poor awareness of deficits. Monitor mood for now. LCSW to follow with patient and family.  4. Neuropsych: This patient is not capable of making decisions on her own behalf.  5. DM type 2: reasonable control. Continue to titrate insulin as needed. SSI 6. HTN: adjust for better control avoid hypoperfusion, DBP's under 100 but last 2 readings were elevated will as RN to recheck manually Labetalol at max,  HCTZ, norvasc. Consider low dose hydralazine 7. H/o paranoid schizophrenia: Has been to [facility] in the past due to decompensation. Not on medications as this time--has been on respiridone in the past and has history of non-compliance per records  8. Dysphagia: s/p G-tube   -on bolus feeds  -diflucan for thrush   9. Resting Tachycardia:     improved    - Normodyne 318m  TID   HR  now in 80-90s for the most pasrt   10. Hypertonicity:  scheduled baclofen low dose continues  -right WHO  LOS (Days) 34 A FACE TO FACE EVALUATION WAS PERFORMED  ACharlett Blake4/21/2015, 7:08 AM

## 2013-07-14 NOTE — Discharge Summary (Signed)
Physician Discharge Summary  Patient ID: [name] MRN: [id number] DOB/AGE: [birth year] 53 y.o.  Admit date: 06/04/2013 Discharge date: 07/08/2013  Discharge Diagnoses:  Principal Problem:   Acute embolic stroke Active Problems:   DM (diabetes mellitus) type II controlled, neurological manifestation   Malignant hypertension   Dysphagia   Discharged Condition:  Stable   Labs:  Basic Metabolic Panel:    Component Value Date/Time   NA 141 07/07/2013 0435   K 3.9 07/07/2013 0435   CL 103 07/07/2013 0435   CO2 26 07/07/2013 0435   GLUCOSE 135* 07/07/2013 0435   BUN 14 07/07/2013 0435   CREATININE 0.63 07/07/2013 0435   CALCIUM 9.5 07/07/2013 0435   GFRNONAA >90 07/07/2013 0435   GFRAA >90 07/07/2013 0435      CBC:    Component Value Date/Time   WBC 8.0 06/25/2013 0701   RBC 4.08 06/25/2013 0701   HGB 12.3 06/25/2013 0701   HCT 36.7 06/25/2013 0701   PLT 337 06/25/2013 0701   MCV 90.0 06/25/2013 0701   MCH 30.1 06/25/2013 0701   MCHC 33.5 06/25/2013 0701   RDW 12.1 06/25/2013 0701   LYMPHSABS 2.7 06/05/2013 0600   MONOABS 0.9 06/05/2013 0600   EOSABS 0.3 06/05/2013 0600   BASOSABS 0.0 06/05/2013 0600     CBG:  Recent Labs Lab 07/07/13 2128 07/08/13 0644 07/08/13 0755 07/08/13 1137  GLUCAP 207* 142* 139* 195*    Brief HPI:   [name] is a 53 y.o. female with history of HTN, DM, bipolar disorder, who was admitted on 05/27/13 with right facial droop and aphasia. 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  Infarcts felt to be embolic due to unknown source and she was placed on ASA for secondary stroke prevention. Patient had progressive neurologic worsening on 05/29/13  pm  with decrease in LOC and right sided weakness. EEG negative for seizures and CCT without significant changes Neurology felt patient with progression of symptoms due to relative hypotension and recommended allowing significantly high permissive hypertension in this patient. Patient with resultant right hemiparesis, severe dysphagia--NPO, expressive aphasia with occasional phonation and needed multimodal cues to follow commands. CIR was recommended for further therapies.    Hospital Course: KINETA FUDALA was admitted to rehab 06/04/2013 for inpatient therapies to consist of PT, ST and OT at least three hours five days a week. Past admission physiatrist, therapy team and rehab RN have worked together to provide customized collaborative inpatient rehab. Blood pressures were allowed to run on the high side initially. As mentation and mobility improved, medications were slowly  titrated for reasonable control. Resting tachycardia has improved with adjustment of normodyne.  She continued to have severe dysphagia and PEG tube was placed on 06/06/13. She was started on bolus tube feeds qid and insulin was titrated to help with diabetes control.  She developed right elbow flexor tone and baclofen as well as WHO was used to help with symptoms. She has made slow but steady gains during her rehab stay with improvement in ability to communicate basic needs. Modified diet was initiated but is inadequate for nutritional support therefore TF to continue past discharge.  She does continue to be limited by right hemiplegia with impaired  motor control with motor and verbal apraxia as well as aphasia. SNF was recommended due to amount of care needed but family has elected on discharge to home and will provide assistance as needed. LOS was extended to complete family education as well as functional mobility training. She was discharged to home with follow up [place], [place], [facility], [facility] as well as SW by [facility].    Rehab  course: During patient's stay in rehab weekly team conferences were held to monitor patient's progress, set goals and discuss barriers to discharge.  She currently requires min assist for bed mobility, basic transfers, stair negotiation and gait.  She requires steady assist with verbal cues for sequencing with self care tasks. She has advanced from NPO to Dysphagia 2 with thin liquids due to improvement in ability to initiate swallow as well as bolus transit. She is able initiate vocalization with yes/no head nods with use of gestures to  to express needs and wants. She requires mod to max assist to compensate for aphasia as well as dysphagia.  Family was also educated on administration and management of tube feeds. Daughter to provide majority of assistance past discharge.     Disposition: 06-Home-Health Care Svc   Diet: Chopped foods. Thin liquids.   Special Instructions: 1. Toilet every 4 hours while awake--monitor for restlessness/discomfort.  2. Wash around PEG tube with soap and water, pat dry and apply dry dressing. Keep tube pinned to pants/shirt to avoid being pulled on.  3. Contact MD if patient develops fever, chills, redness or drainage around the tube or changes in Mental status or physical status.  4. Check blood sugars before meals and record. ([employer] brand machine has reasonably priced strips.)  5.  Need full supervision during meals. One bite/sip at a time--NO distraction/NO TV. Check to make sure mouth is empty before next bite. No straws.        Future Appointments Provider Department Dept Phone   07/17/2013 2:00 PM [facility] [phone]   08/12/2013 11:30 AM Charlett Blake, MD [facility] [phone]       Medication List         acetaminophen 325 MG tablet  Commonly known as:  TYLENOL  Take 1-2 tablets (325-650 mg total) by mouth every 4 (four) hours as needed for mild pain  (available over the counter).     amLODipine 10 MG tablet  Commonly known as:  NORVASC  Take 1 tablet (10 mg total) by mouth daily. For blood pressure     aspirin 325 MG tablet  Place 1 tablet (325 mg total) into feeding tube daily.     baclofen 10 MG tablet  Commonly known as:  LIORESAL  Take 0.5 tablets (5 mg total) by mouth 2 (two) times daily. To help with tone/spasms     chlorhexidine 0.12 % solution  Commonly known as:  PERIDEX  15 mLs by Mouth Rinse route 2 (two) times daily. After lunch and supper     feeding supplement (GLUCERNA 1.2 CAL) Liqd  Place 350 mLs into feeding tube at bedtime.     feeding supplement (GLUCERNA 1.2 CAL) Liqd  Place 350 mLs into feeding tube 3 (three) times daily as needed (if patient eats less than 50% of her meal).     free water Soln  Place 200 mLs into feeding tube 2 (two) times daily. Use filtered water.     gabapentin 100 MG capsule  Commonly  known as:  NEURONTIN  Take 1 capsule (100 mg total) by mouth 3 (three) times daily. BY mouth or PEG.     hydrochlorothiazide 12.5 MG tablet  Commonly known as:  HYDRODIURIL  Take 1 tablet (12.5 mg total) by mouth daily.     Insulin Glargine 100 UNIT/ML Solostar Pen  Commonly known as:  LANTUS SOLOSTAR  Inject 20 Units into the skin 2 (two) times daily. With breakfast and supper     labetalol 300 MG tablet  Commonly known as:  NORMODYNE  1 tablet (300 mg total) by Per NG tube route 3 (three) times daily.     simvastatin 20 MG tablet  Commonly known as:  ZOCOR  Place 1 tablet (20 mg total) into feeding tube daily at 6 PM.       Follow-up Information   Follow up with Charlett Blake, MD On 08/12/2013. (Be there at 11 am for 11:30 appointment.)    Specialty:  Physical Medicine and Rehabilitation   Contact information:   [place] [address] [phone]       Follow up with Forbes Cellar, MD. Call today. (for follow up  appointment  in a month)     Specialties:  Neurology, Radiology   Contact information:   [address] [place] [address] [phone]       Follow up with [facility]     On 07/17/2013. (APPT @ 2;00 PM)    Contact information:   [place] [address] [phone]      Signed: Bary Leriche 07/14/2013, 5:47 PM

## 2013-07-17 ENCOUNTER — Ambulatory Visit: Payer: Medicaid Other | Attending: Internal Medicine

## 2013-07-17 ENCOUNTER — Telehealth: Payer: Self-pay | Admitting: *Deleted

## 2013-07-17 DIAGNOSIS — E119 Type 2 diabetes mellitus without complications: Secondary | ICD-10-CM

## 2013-07-17 DIAGNOSIS — I69920 Aphasia following unspecified cerebrovascular disease: Secondary | ICD-10-CM | POA: Diagnosis not present

## 2013-07-17 DIAGNOSIS — I69991 Dysphagia following unspecified cerebrovascular disease: Secondary | ICD-10-CM | POA: Diagnosis not present

## 2013-07-17 DIAGNOSIS — R1310 Dysphagia, unspecified: Secondary | ICD-10-CM | POA: Diagnosis not present

## 2013-07-17 DIAGNOSIS — Z1329 Encounter for screening for other suspected endocrine disorder: Secondary | ICD-10-CM

## 2013-07-17 DIAGNOSIS — Z794 Long term (current) use of insulin: Secondary | ICD-10-CM

## 2013-07-17 DIAGNOSIS — F319 Bipolar disorder, unspecified: Secondary | ICD-10-CM

## 2013-07-17 DIAGNOSIS — Z431 Encounter for attention to gastrostomy: Secondary | ICD-10-CM

## 2013-07-17 DIAGNOSIS — I10 Essential (primary) hypertension: Secondary | ICD-10-CM

## 2013-07-17 DIAGNOSIS — I69959 Hemiplegia and hemiparesis following unspecified cerebrovascular disease affecting unspecified side: Secondary | ICD-10-CM | POA: Diagnosis not present

## 2013-07-17 NOTE — Telephone Encounter (Signed)
Calling to see if statement of medical necessity for a 3n1 bedside commode had been signed by Dr Letta Pate yet.  Left fax # [phone]  It has been faxed

## 2013-07-22 ENCOUNTER — Telehealth: Payer: Self-pay

## 2013-07-22 NOTE — Telephone Encounter (Signed)
[facility] called regarding patient complaining of diarrhea.  He was requesting a stool sample.  Marlyce Huge he would need to reach out to patients family provider.

## 2013-07-31 ENCOUNTER — Telehealth: Payer: Self-pay

## 2013-07-31 NOTE — Telephone Encounter (Signed)
Olean Ree (PT @ [facility]) called to see if patient had a F/U appt with Dr. Letta Pate. [place] to inform her that the patient does have a F/U appt on 5/26 @ 11:30 am.

## 2013-08-12 ENCOUNTER — Ambulatory Visit (HOSPITAL_BASED_OUTPATIENT_CLINIC_OR_DEPARTMENT_OTHER): Payer: No Typology Code available for payment source | Admitting: Physical Medicine & Rehabilitation

## 2013-08-12 ENCOUNTER — Encounter: Payer: Self-pay | Admitting: Physical Medicine & Rehabilitation

## 2013-08-12 ENCOUNTER — Encounter: Payer: Medicaid Other | Attending: Physical Medicine & Rehabilitation

## 2013-08-12 VITALS — BP 192/101 | HR 104 | Resp 14 | Wt 149.0 lb

## 2013-08-12 DIAGNOSIS — I10 Essential (primary) hypertension: Secondary | ICD-10-CM | POA: Insufficient documentation

## 2013-08-12 DIAGNOSIS — G8110 Spastic hemiplegia affecting unspecified side: Secondary | ICD-10-CM

## 2013-08-12 DIAGNOSIS — E119 Type 2 diabetes mellitus without complications: Secondary | ICD-10-CM | POA: Insufficient documentation

## 2013-08-12 DIAGNOSIS — R4701 Aphasia: Secondary | ICD-10-CM

## 2013-08-12 DIAGNOSIS — Z8719 Personal history of other diseases of the digestive system: Secondary | ICD-10-CM

## 2013-08-12 DIAGNOSIS — I69920 Aphasia following unspecified cerebrovascular disease: Secondary | ICD-10-CM | POA: Insufficient documentation

## 2013-08-12 DIAGNOSIS — I6350 Cerebral infarction due to unspecified occlusion or stenosis of unspecified cerebral artery: Secondary | ICD-10-CM

## 2013-08-12 DIAGNOSIS — R482 Apraxia: Secondary | ICD-10-CM | POA: Insufficient documentation

## 2013-08-12 DIAGNOSIS — IMO0002 Reserved for concepts with insufficient information to code with codable children: Secondary | ICD-10-CM

## 2013-08-12 DIAGNOSIS — I69959 Hemiplegia and hemiparesis following unspecified cerebrovascular disease affecting unspecified side: Secondary | ICD-10-CM | POA: Insufficient documentation

## 2013-08-12 MED ORDER — SIMVASTATIN 20 MG PO TABS: 20.0000 mg | ORAL_TABLET | Freq: Every day | ORAL | Status: DC

## 2013-08-12 MED ORDER — GABAPENTIN 100 MG PO CAPS: 100.0000 mg | ORAL_CAPSULE | Freq: Three times a day (TID) | ORAL | Status: DC

## 2013-08-12 MED ORDER — HYDROCHLOROTHIAZIDE 12.5 MG PO TABS: 12.5000 mg | ORAL_TABLET | Freq: Every day | ORAL | Status: DC

## 2013-08-12 MED ORDER — AMLODIPINE BESYLATE 10 MG PO TABS: 10.0000 mg | ORAL_TABLET | Freq: Every day | ORAL | Status: DC

## 2013-08-12 MED ORDER — LABETALOL HCL 300 MG PO TABS: 300.0000 mg | ORAL_TABLET | Freq: Three times a day (TID) | ORAL | Status: DC

## 2013-08-12 MED ORDER — INSULIN GLARGINE 100 UNIT/ML SOLOSTAR PEN: 20.0000 [IU] | PEN_INJECTOR | Freq: Two times a day (BID) | SUBCUTANEOUS | Status: DC

## 2013-08-12 NOTE — Progress Notes (Signed)
Subjective:    Patient ID: [name], female    DOB: March 02, 1961, 53 y.o.   MRN: [id number]  53 y.o. female with history of HTN, DM, bipolar disorder, who was admitted on 05/27/13 with right facial droop and aphasia. 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  Infarcts felt to be embolic due to unknown source and she was placed on ASA for secondary stroke prevention. Patient had progressive neurologic worsening on 05/29/13 pm  with decrease in LOC and right sided weakness. EEG negative for seizures and CCT without significant changes Neurology felt patient with progression of symptoms due to relative hypotension and recommended allowing significantly high permissive hypertension in this patient. Patient with resultant right hemiparesis, severe dysphagia  HPI Inpatient rehabilitation stay [name] Admit date: 06/04/2013 Discharge date: 07/08/2013  Lives with daughter who provides 24 hour care. Patient is dependent in all ADLs and mobility. Finishing a home health PT OT and speech.  [facility] gone to the [facility] to meet with a financial counselor but has not been seen by a physician yet. Ran out of her antihypertensive medications 4 days ago  Patient was discharged Pain Inventory Average Pain 5 Pain Right Now 0 My pain is stabbing  In the last 24 hours, has pain interfered with the following? General activity 0 Relation with others 0 Enjoyment of life 0 What TIME of day is your pain at its worst? evening Sleep (in general) Good  Pain is worse with: walking and standing Pain improves with: rest, therapy/exercise and medication Relief  from Meds: 2  Mobility walk with assistance ability to climb steps?  no do you drive?  no use a wheelchair needs help with transfers  Function disabled: date disabled . I need assistance with the following:  dressing, bathing, toileting, meal prep, household duties and shopping  Neuro/Psych trouble walking  Prior Studies Any changes since last visit?  no  Physicians involved in your care Any changes since last visit?  no   History reviewed. No pertinent family history. History   Social History  . Marital Status: Single    Spouse Name: N/A    Number of Children: N/A  . Years of Education: N/A   Social History Main Topics  . Smoking status: Never Smoker   . Smokeless tobacco: Never Used  . Alcohol Use: No  . Drug Use: No  . Sexual Activity: None   Other Topics Concern  . None   Social History Narrative  . None   Past Surgical History  Procedure Laterality Date  . Cesarean section  ~ 1987  . Tubal ligation     Past Medical History  Diagnosis Date  . Hypertension   . Depression   . Diabetes mellitus     ?type (05/29/2013)  . Bipolar affective disorder   . Schizophrenia     [name] 11/28/2007 (05/29/2013)  . Stroke 05/27/2013   BP 192/101  Pulse 104  Resp 14  Wt 149 lb (67.586 kg)  SpO2 99%  Opioid Risk Score:   Fall Risk Score: Moderate Fall Risk (6-13 points) (educated and handout given for fall prevention in the home)  Review of Systems  Gastrointestinal: Positive for nausea.  Endocrine:       High blood sugars, out of meds and supplies. Has not been checked  Musculoskeletal: Positive for gait problem.  Neurological: Positive for weakness.  All other systems reviewed and are negative.      Objective:   Physical Exam  Speech with both receptive and expressive aphasia  Right knee has pain with range of motion but has full extension and lacks about 30 of flexion. Crepitus right knee  Left knee has full range of motion minimal pain at end  range flexion. Mild crepitus  No evidence of knee effusion or erythema bilaterally  No evidence of Swelling tenderness or pretibial edema Motor strength is 5/5 in the left deltoid, bicep, tricep, grip although testing is somewhat limited by apraxia she tends to push when asked to pull and vice versa Left lower extremity 4 minus in the hip flexors 5 in the knee extensors ankle dorsiflexor plantar flexion Right upper extremity trace abduction and elbow flexion otherwise 0 Right lower extremity trace hip knee extensor synergy otherwise 0/5 strength Sensation can not assessed secondary to aphasia     Assessment & Plan:  1. Left MCA distribution infarct with right hemiparesis as well as severe aphasia and apraxia. Dysphagia has improved patient is now taking per os medications as well as adequate per os fluid and caloric intake.  Will refer to outpatient PT OT and speech therapy  Will refer to interventional radiology for G-tube removal  2. Primary care Follow up with [facility]     On 07/17/2013. (APPT @ 2;00 PM)     Contact information:    [place] [id number] [phone]   Given that the patient has run out of her antihypertensive medications and her blood pressure is elevated bowel right refills of the medications she was on when she left the hospital. I did explain to the patient and her daughter that I would not be providing primary care services on an ongoing basis   Over half of the 25 min visit was spent counseling and coordinating care. Discussed multiple care needs with daughter as well as the patient.

## 2013-08-12 NOTE — Patient Instructions (Signed)
Please call community wellness center  Referral made to radiology department at [facility] to remove feeding tube  Start outpatient therapy, they will call you to set up appointments  Prescriptions written for your medications at [facility]

## 2013-08-14 ENCOUNTER — Ambulatory Visit: Payer: No Typology Code available for payment source | Attending: Internal Medicine

## 2013-08-22 ENCOUNTER — Ambulatory Visit (HOSPITAL_COMMUNITY)
Admission: RE | Admit: 2013-08-22 | Discharge: 2013-08-22 | Disposition: A | Discharge status: Home / Self Care | Payer: No Typology Code available for payment source | Source: Ambulatory Visit | Attending: Internal Medicine | Admitting: Internal Medicine

## 2013-08-22 ENCOUNTER — Other Ambulatory Visit: Payer: Self-pay | Admitting: Physical Medicine & Rehabilitation

## 2013-08-22 ENCOUNTER — Other Ambulatory Visit: Payer: Self-pay | Admitting: Internal Medicine

## 2013-08-22 ENCOUNTER — Ambulatory Visit: Payer: No Typology Code available for payment source | Attending: Internal Medicine | Admitting: Internal Medicine

## 2013-08-22 ENCOUNTER — Encounter: Payer: Self-pay | Admitting: Internal Medicine

## 2013-08-22 VITALS — BP 143/94 | HR 92 | Temp 97.2°F | Resp 20 | Ht 65.0 in | Wt 143.0 lb

## 2013-08-22 DIAGNOSIS — K5900 Constipation, unspecified: Secondary | ICD-10-CM | POA: Insufficient documentation

## 2013-08-22 DIAGNOSIS — I6340 Cerebral infarction due to embolism of unspecified cerebral artery: Secondary | ICD-10-CM

## 2013-08-22 DIAGNOSIS — I69998 Other sequelae following unspecified cerebrovascular disease: Secondary | ICD-10-CM | POA: Insufficient documentation

## 2013-08-22 DIAGNOSIS — E1310 Other specified diabetes mellitus with ketoacidosis without coma: Secondary | ICD-10-CM | POA: Insufficient documentation

## 2013-08-22 DIAGNOSIS — I69920 Aphasia following unspecified cerebrovascular disease: Secondary | ICD-10-CM | POA: Insufficient documentation

## 2013-08-22 DIAGNOSIS — E1110 Type 2 diabetes mellitus with ketoacidosis without coma: Secondary | ICD-10-CM

## 2013-08-22 DIAGNOSIS — Z79899 Other long term (current) drug therapy: Secondary | ICD-10-CM | POA: Insufficient documentation

## 2013-08-22 DIAGNOSIS — Z7982 Long term (current) use of aspirin: Secondary | ICD-10-CM | POA: Insufficient documentation

## 2013-08-22 DIAGNOSIS — Z9181 History of falling: Secondary | ICD-10-CM | POA: Insufficient documentation

## 2013-08-22 DIAGNOSIS — Z8673 Personal history of transient ischemic attack (TIA), and cerebral infarction without residual deficits: Secondary | ICD-10-CM | POA: Insufficient documentation

## 2013-08-22 DIAGNOSIS — R1310 Dysphagia, unspecified: Secondary | ICD-10-CM

## 2013-08-22 DIAGNOSIS — Z931 Gastrostomy status: Secondary | ICD-10-CM | POA: Insufficient documentation

## 2013-08-22 DIAGNOSIS — R5383 Other fatigue: Secondary | ICD-10-CM

## 2013-08-22 DIAGNOSIS — Z791 Long term (current) use of non-steroidal anti-inflammatories (NSAID): Secondary | ICD-10-CM | POA: Insufficient documentation

## 2013-08-22 DIAGNOSIS — I639 Cerebral infarction, unspecified: Secondary | ICD-10-CM

## 2013-08-22 DIAGNOSIS — I10 Essential (primary) hypertension: Secondary | ICD-10-CM | POA: Insufficient documentation

## 2013-08-22 DIAGNOSIS — R5381 Other malaise: Secondary | ICD-10-CM | POA: Insufficient documentation

## 2013-08-22 DIAGNOSIS — R29898 Other symptoms and signs involving the musculoskeletal system: Secondary | ICD-10-CM | POA: Insufficient documentation

## 2013-08-22 LAB — POCT GLYCOSYLATED HEMOGLOBIN (HGB A1C): Hemoglobin A1C: 6.8

## 2013-08-22 LAB — GLUCOSE, POCT (MANUAL RESULT ENTRY): POC Glucose: 148 mg/dl — AB (ref 70–99)

## 2013-08-22 MED ORDER — INSULIN GLARGINE 100 UNIT/ML SOLOSTAR PEN: 20.0000 [IU] | PEN_INJECTOR | Freq: Two times a day (BID) | SUBCUTANEOUS | Status: DC

## 2013-08-22 MED ORDER — POLYETHYLENE GLYCOL 3350 17 GM/SCOOP PO POWD: 17.0000 g | Freq: Every day | ORAL | Status: DC

## 2013-08-22 MED ORDER — HYDROCHLOROTHIAZIDE 12.5 MG PO TABS: 25.0000 mg | ORAL_TABLET | Freq: Every day | ORAL | Status: DC

## 2013-08-22 NOTE — Progress Notes (Signed)
Patient ID: [name], female   DOB: 1960-09-13, 53 y.o.   MRN: [id number]  VHQ:[id number]  UXL:[id number]  DOB - 06-30-1960  CC:  Chief Complaint  Patient presents with  . Establish Care  . Diabetes  . Hypertension       HPI: [name] is a 53 y.o. female here today to establish medical care.  Had a stroke in March and has first f/u appt with Neurologist in September.  Has not had a PCP visit since hospital d/c.  Checks BS twice daily ranging from 92-148.  Patient checks BP daily.  BP ranging from 140-150/87-111. Daughter helps patient 24 hours per days with medications and ADL's.  Daughter reports that she gives patient her medication daily.  Patient is able to eat solid foods without difficulty, was told by rehab that she can have PEG tube removed now. She has been scheduled for outpatient rehab beginning on June 30th.  She has ran out of visits for home care. Still having right sided weakness, not able to ambulate independently, and has apashia.  Only able to speak 2 words at the moment.  Was in intensive rehab.  Daughter states that the patients hip feels like it is popping out of place with ambulation.  Has not had a bowel movement in one week.   No Known Allergies Past Medical History  Diagnosis Date  . Hypertension   . Depression   . Diabetes mellitus     ?type (05/29/2013)  . Bipolar affective disorder   . Schizophrenia     [name] 11/28/2007 (05/29/2013)  . Stroke 05/27/2013   Current Outpatient Prescriptions on File Prior to Visit  Medication Sig Dispense Refill  . acetaminophen (TYLENOL) 325 MG tablet Take 1-2 tablets (325-650 mg total) by mouth every 4 (four) hours as needed for mild pain (available over the counter).      Marland Kitchen amLODipine (NORVASC) 10 MG tablet Take 1 tablet (10 mg total) by mouth daily. For blood pressure  30 tablet  1  . aspirin 325 MG tablet Place 1 tablet (325 mg total) into feeding tube daily.  30 tablet  1  . baclofen (LIORESAL) 10 MG tablet  Take 0.5 tablets (5 mg total) by mouth 2 (two) times daily. To help with tone/spasms  30 tablet  1  . chlorhexidine (PERIDEX) 0.12 % solution 15 mLs by Mouth Rinse route 2 (two) times daily. After lunch and supper  120 mL  0  . gabapentin (NEURONTIN) 100 MG capsule Take 1 capsule (100 mg total) by mouth 3 (three) times daily. BY mouth or PEG.  90 capsule  1  . hydrochlorothiazide (HYDRODIURIL) 12.5 MG tablet Take 1 tablet (12.5 mg total) by mouth daily.  30 tablet  1  . labetalol (NORMODYNE) 300 MG tablet 1 tablet (300 mg total) by Per NG tube route 3 (three) times daily.  90 tablet  1  . Nutritional Supplements (FEEDING SUPPLEMENT, GLUCERNA 1.2 CAL,) LIQD Place 350 mLs into feeding tube at bedtime.  1000 mL  1  . Water For Irrigation, Sterile (FREE WATER) SOLN Place 200 mLs into feeding tube 2 (two) times daily. Use filtered water.  1000 mL  1  . Nutritional Supplements (FEEDING SUPPLEMENT, GLUCERNA 1.2 CAL,) LIQD Place 350 mLs into feeding tube 3 (three) times daily as needed (if patient eats less than 50% of her meal).  1500 mL  1  . simvastatin (ZOCOR) 20 MG tablet Place 1 tablet (20 mg total) into feeding tube daily at  6 PM.  30 tablet  1   No current facility-administered medications on file prior to visit.   History reviewed. No pertinent family history. History   Social History  . Marital Status: Single    Spouse Name: N/A    Number of Children: N/A  . Years of Education: N/A   Occupational History  . Not on file.   Social History Main Topics  . Smoking status: Never Smoker   . Smokeless tobacco: Never Used  . Alcohol Use: No  . Drug Use: No  . Sexual Activity: Not on file   Other Topics Concern  . Not on file   Social History Narrative  . No narrative on file   Review of Systems  Eyes: Negative for blurred vision and double vision.  Respiratory: Negative for cough and shortness of breath.   Cardiovascular: Positive for leg swelling (right ankle swelling). Negative for  chest pain and palpitations.  Gastrointestinal: Positive for constipation (no bowel movement in 1 week). Negative for nausea, vomiting and abdominal pain.       Normally has 2 BM today  Genitourinary: Negative.   Musculoskeletal: Negative.   Neurological: Positive for weakness and headaches. Negative for dizziness and tingling.  Endo/Heme/Allergies: Negative.   Psychiatric/Behavioral: Negative.       Objective:   Filed Vitals:   08/22/13 1127  BP: 143/94  Pulse: 92  Temp: 97.2 F (36.2 C)  Resp: 20   Physical Exam  Vitals reviewed. Constitutional: She is oriented to person, place, and time. She appears well-nourished. No distress.  HENT:  Right Ear: External ear normal.  Left Ear: External ear normal.  Mouth/Throat: Oropharynx is clear and moist.  Eyes: Conjunctivae and EOM are normal. Pupils are equal, round, and reactive to light.  Neck: Normal range of motion. Neck supple. No JVD present.  Cardiovascular: Normal rate, regular rhythm and normal heart sounds.   Pulmonary/Chest: Effort normal and breath sounds normal.  Abdominal: Soft. She exhibits no distension. There is no tenderness.  Sluggish bowel sounds   Musculoskeletal: She exhibits edema (RLE trace edema).  Patient is limited to wheelchair Flaccid right side Stiff with passive ROM, no pain  Lymphadenopathy:    She has no cervical adenopathy.  Neurological: She is alert and oriented to person, place, and time. She displays abnormal reflex. A cranial nerve deficit is present. She exhibits abnormal muscle tone. Coordination abnormal.  Right side residual from past embolic stroke   Skin: Skin is warm and dry.  Psychiatric: Her behavior is normal.  Patient able to follow commands but not able to expressive self   .  Lab Results  Component Value Date   WBC 8.0 06/25/2013   HGB 12.3 06/25/2013   HCT 36.7 06/25/2013   MCV 90.0 06/25/2013   PLT 337 06/25/2013   Lab Results  Component Value Date   CREATININE 0.63  07/07/2013   BUN 14 07/07/2013   NA 141 07/07/2013   K 3.9 07/07/2013   CL 103 07/07/2013   CO2 26 07/07/2013    Lab Results  Component Value Date   HGBA1C 6.8 08/22/2013   Lipid Panel     Component Value Date/Time   CHOL 201* 05/28/2013 0300   TRIG 145 05/28/2013 0300   HDL 42 05/28/2013 0300   CHOLHDL 4.8 05/28/2013 0300   VLDL 29 05/28/2013 0300   LDLCALC 130* 05/28/2013 0300       Assessment and plan:   [name] was seen today for establish care, diabetes  and hypertension.  Diagnoses and associated orders for this visit:  Type II or unspecified type diabetes mellitus with ketoacidosis, not stated as uncontrolled - HgB A1c - Glucose (CBG)  Acute embolic stroke - DG Hip Complete Right; Future  Unspecified constipation - polyethylene glycol powder (GLYCOLAX/MIRALAX) powder; Take 17 g by mouth daily.  HTN (hypertension) - Increased to hydrochlorothiazide 25 mg daily. Unsure if patient was taking 12.5 or 25 mg daily. May add additional agent if BP still elevated.   Return in about 2 weeks (around 09/05/2013) for Nurse - BP visit, 1 month with PCP.    Lance Bosch, [facility] [phone] 08/24/2013, 9:36 PM

## 2013-08-22 NOTE — Progress Notes (Signed)
Patient here to establish care. Hx of CVA 05/2013. Patient with facial droop, aphasia. Patient uses wheelchair for ambulation, able to transfer independently. Needs assist with all other ADLs. Patient and patient's daughter interested in PEG removal. Hx of diabetes, hypertension. Alc, glucose checked today. Patient has BP and blood glucose log. BP elevated 143/94.

## 2013-08-25 ENCOUNTER — Ambulatory Visit (HOSPITAL_COMMUNITY)
Admission: RE | Admit: 2013-08-25 | Discharge: 2013-08-25 | Disposition: A | Discharge status: Home / Self Care | Payer: No Typology Code available for payment source | Source: Ambulatory Visit | Attending: Physical Medicine & Rehabilitation | Admitting: Physical Medicine & Rehabilitation

## 2013-08-25 DIAGNOSIS — R1310 Dysphagia, unspecified: Secondary | ICD-10-CM

## 2013-08-25 DIAGNOSIS — Z431 Encounter for attention to gastrostomy: Secondary | ICD-10-CM | POA: Insufficient documentation

## 2013-08-25 DIAGNOSIS — Z8673 Personal history of transient ischemic attack (TIA), and cerebral infarction without residual deficits: Secondary | ICD-10-CM | POA: Insufficient documentation

## 2013-08-25 MED ORDER — LIDOCAINE VISCOUS 2 % MT SOLN
15.0000 mL | Freq: Once | OROMUCOSAL | Status: AC
  Administered 2013-08-25: 15 mL via OROMUCOSAL

## 2013-08-26 ENCOUNTER — Telehealth: Payer: Self-pay | Admitting: Emergency Medicine

## 2013-08-26 NOTE — Telephone Encounter (Signed)
Message copied by Ricci Barker on Tue Aug 26, 2013 10:30 AM ------      Message from: Lance Bosch      Created: Sun Aug 24, 2013  6:41 PM       Let daughter know her mom xray is normal. Thanks ------

## 2013-08-26 NOTE — Telephone Encounter (Signed)
Daughter notified of mom's x-ray results. Discussed miralax. Daughter stated that patient is having BM but continues to strain.  Instructed to allow give medication some time and should notice softer stools in coming days.  L.Ducatte, RN

## 2013-09-05 ENCOUNTER — Ambulatory Visit: Payer: No Typology Code available for payment source | Attending: Internal Medicine | Admitting: *Deleted

## 2013-09-05 VITALS — BP 133/92 | HR 92

## 2013-09-05 DIAGNOSIS — I10 Essential (primary) hypertension: Secondary | ICD-10-CM

## 2013-09-05 NOTE — Patient Instructions (Signed)
Hypertension Hypertension, commonly called high blood pressure, is when the force of blood pumping through your arteries is too strong. Your arteries are the blood vessels that carry blood from your heart throughout your body. A blood pressure reading consists of a higher number over a lower number, such as 110/72. The higher number (systolic) is the pressure inside your arteries when your heart pumps. The lower number (diastolic) is the pressure inside your arteries when your heart relaxes. Ideally you want your blood pressure below 120/80. Hypertension forces your heart to work harder to pump blood. Your arteries may become narrow or stiff. Having hypertension puts you at risk for heart disease, stroke, and other problems.  RISK FACTORS Some risk factors for high blood pressure are controllable. Others are not.  Risk factors you cannot control include:   Race. You may be at higher risk if you are African American.  Age. Risk increases with age.  Gender. Men are at higher risk than women before age 45 years. After age 65, women are at higher risk than men. Risk factors you can control include:  Not getting enough exercise or physical activity.  Being overweight.  Getting too much fat, sugar, calories, or salt in your diet.  Drinking too much alcohol. SIGNS AND SYMPTOMS Hypertension does not usually cause signs or symptoms. Extremely high blood pressure (hypertensive crisis) may cause headache, anxiety, shortness of breath, and nosebleed. DIAGNOSIS  To check if you have hypertension, your health care provider will measure your blood pressure while you are seated, with your arm held at the level of your heart. It should be measured at least twice using the same arm. Certain conditions can cause a difference in blood pressure between your right and left arms. A blood pressure reading that is higher than normal on one occasion does not mean that you need treatment. If one blood pressure reading  is high, ask your health care provider about having it checked again. TREATMENT  Treating high blood pressure includes making lifestyle changes and possibly taking medication. Living a healthy lifestyle can help lower high blood pressure. You may need to change some of your habits. Lifestyle changes may include:  Following the DASH diet. This diet is high in fruits, vegetables, and whole grains. It is low in salt, red meat, and added sugars.  Getting at least 2 1/2 hours of brisk physical activity every week.  Losing weight if necessary.  Not smoking.  Limiting alcoholic beverages.  Learning ways to reduce stress. If lifestyle changes are not enough to get your blood pressure under control, your health care provider may prescribe medicine. You may need to take more than one. Work closely with your health care provider to understand the risks and benefits. HOME CARE INSTRUCTIONS  Have your blood pressure rechecked as directed by your health care provider.   Only take medicine as directed by your health care provider. Follow the directions carefully. Blood pressure medicines must be taken as prescribed. The medicine does not work as well when you skip doses. Skipping doses also puts you at risk for problems.   Do not smoke.   Monitor your blood pressure at home as directed by your health care provider. SEEK MEDICAL CARE IF:   You think you are having a reaction to medicines taken.  You have recurrent headaches or feel dizzy.  You have swelling in your ankles.  You have trouble with your vision. SEEK IMMEDIATE MEDICAL CARE IF:  You develop a severe headache or   confusion.  You have unusual weakness, numbness, or feel faint.  You have severe chest or abdominal pain.  You vomit repeatedly.  You have trouble breathing. MAKE SURE YOU:   Understand these instructions.  Will watch your condition.  Will get help right away if you are not doing well or get  worse. Document Released: 03/06/2005 Document Revised: 03/11/2013 Document Reviewed: 12/27/2012 [employer] Patient Information 2015 [employer]. This information is not intended to replace advice given to you by your health care provider. Make sure you discuss any questions you have with your health care provider.  

## 2013-09-12 ENCOUNTER — Encounter: Payer: No Typology Code available for payment source | Attending: Physical Medicine & Rehabilitation

## 2013-09-12 ENCOUNTER — Encounter: Payer: Self-pay | Admitting: Physical Medicine & Rehabilitation

## 2013-09-12 ENCOUNTER — Ambulatory Visit (HOSPITAL_BASED_OUTPATIENT_CLINIC_OR_DEPARTMENT_OTHER): Payer: No Typology Code available for payment source | Admitting: Physical Medicine & Rehabilitation

## 2013-09-12 VITALS — BP 139/85 | HR 89 | Resp 14 | Ht 65.0 in | Wt 143.0 lb

## 2013-09-12 DIAGNOSIS — I10 Essential (primary) hypertension: Secondary | ICD-10-CM | POA: Insufficient documentation

## 2013-09-12 DIAGNOSIS — R482 Apraxia: Secondary | ICD-10-CM

## 2013-09-12 DIAGNOSIS — I6330 Cerebral infarction due to thrombosis of unspecified cerebral artery: Secondary | ICD-10-CM

## 2013-09-12 DIAGNOSIS — IMO0002 Reserved for concepts with insufficient information to code with codable children: Secondary | ICD-10-CM

## 2013-09-12 DIAGNOSIS — G8110 Spastic hemiplegia affecting unspecified side: Secondary | ICD-10-CM

## 2013-09-12 DIAGNOSIS — I69959 Hemiplegia and hemiparesis following unspecified cerebrovascular disease affecting unspecified side: Secondary | ICD-10-CM | POA: Insufficient documentation

## 2013-09-12 DIAGNOSIS — I69920 Aphasia following unspecified cerebrovascular disease: Secondary | ICD-10-CM

## 2013-09-12 DIAGNOSIS — I69390 Apraxia following cerebral infarction: Secondary | ICD-10-CM | POA: Insufficient documentation

## 2013-09-12 DIAGNOSIS — E119 Type 2 diabetes mellitus without complications: Secondary | ICD-10-CM | POA: Insufficient documentation

## 2013-09-12 DIAGNOSIS — I63512 Cerebral infarction due to unspecified occlusion or stenosis of left middle cerebral artery: Secondary | ICD-10-CM

## 2013-09-12 MED ORDER — BACLOFEN 10 MG PO TABS: 10.0000 mg | ORAL_TABLET | Freq: Two times a day (BID) | ORAL | Status: DC

## 2013-09-12 NOTE — Patient Instructions (Signed)
Handicapped parking sticker form filled out please take to [facility]   Increased baclofen to 1 full tablet twice a day for spasms

## 2013-09-12 NOTE — Progress Notes (Signed)
Subjective:    Patient ID: [name], female    DOB: 1960/10/14, 53 y.o.   MRN: [id number] Accompanied by her daughter and significant other HPI 53 y.o. female with history of HTN, DM, bipolar disorder, who was admitted on 05/27/13 with right facial droop and aphasia. 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 Infarcts felt to be embolic due to unknown source and she was placed on ASA for secondary stroke prevention. Patient had progressive neurologic worsening on 05/29/13 pm with decrease in LOC and right sided weakness. EEG negative for seizures and CCT without significant changes Neurology felt patient with progression of symptoms due to relative hypotension and recommended allowing significantly high permissive hypertension in this patient. Patient with resultant right hemiparesis, severe dysphagia  Also has complained of hip pain on the right side. X-rays performed this month which were normal. Pain is during transfers back to bed in the evening.  Has had PEG tube removed by radiology. Eating well and drinking well.  Also has followed up with internal medicine for blood pressure and other medical issues. Medications have been adjusted specifically hydrochlorothiazide  Started in outpatient therapy  Pain Inventory Average Pain 6 Pain Right Now 0 My pain is aching  In the last 24 hours, has pain interfered with the following? General activity 3 Relation with others 0 Enjoyment of life 3 What TIME of day is your pain at its worst? evening Sleep (in general) Good  Pain is worse with: sitting and standing Pain improves with: rest and medication Relief from Meds:  8  Mobility walk with assistance use a walker ability to climb steps?  no do you drive?  no use a wheelchair needs help with transfers  Function disabled: date disabled . I need assistance with the following:  dressing, bathing, toileting and meal prep  Neuro/Psych trouble walking  Prior Studies x-rays CLINICAL DATA: Recent stroke and multiple falls since leaving the  hospital 1.5 months ago. Feels like hip is popping out of place.  EXAM:  RIGHT HIP - COMPLETE 2+ VIEW  COMPARISON: Abdominal radiograph 06/16/2013  FINDINGS:  There is no evidence of acute fracture or dislocation. Hip joint  spaces appear relatively well preserved. No lytic or blastic osseous  lesion is seen. Phleboliths are noted in the pelvis.  IMPRESSION:  No acute osseous abnormality.  Physicians involved in your care Primary care . Neurologist .   History reviewed. No pertinent family history. History   Social History  . Marital Status: Single    Spouse Name: N/A    Number of Children: N/A  . Years of Education: N/A   Social History Main Topics  . Smoking status: Never Smoker   . Smokeless tobacco: Never Used  . Alcohol Use: No  . Drug Use: No  . Sexual Activity: None   Other Topics Concern  . None   Social History Narrative  . None   Past Surgical History  Procedure Laterality Date  . Cesarean section  ~ 1987  . Tubal ligation     Past Medical History  Diagnosis Date  . Hypertension   . Depression   . Diabetes mellitus     ?type (05/29/2013)  . Bipolar affective disorder   .  Schizophrenia     [name] 11/28/2007 (05/29/2013)  . Stroke 05/27/2013   BP 139/85  Pulse 89  Resp 14  Ht 5\' 5"  (1.651 m)  Wt 143 lb (64.864 kg)  BMI 23.80 kg/m2  SpO2 100%  Opioid Risk Score:   Fall Risk Score: Moderate Fall Risk (6-13 points) (patient educated handout declined)   Review of Systems  Musculoskeletal: Positive for gait problem.  All other systems reviewed and are negative.       Objective:   Physical Exam  Right hip no pain with internal/external rotation. There is no tenderness palpation over the right thigh. No tenderness palpation over the knee there is some playing with right shoulder range of motion limited range of motion external rotation abduction. Motor strength is 0/5 right upper extremity Right lower extremity is 2 minus knee extension hip extensor synergy trace at the ankle. Left side is 4/5 in deltoid, bicep, tricep, grip Left lower extremity has antigravity plus difficult to test secondary to apraxia Sensation cannot evaluate secondary to aphasia Speech anarthria. Will not yes and no appears to be greater than 50% accurate.        Assessment & Plan:  1. 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 Has had minimal neurologic recovery. Swallowing function however has improved and no longer uses feeding tube and this has been discontinued by interventional radiology.  Recommend continued outpatient PT OT and speech therapy. This will likely be a very slow recovery with significant permanent impairments  2. Hip pain exam is unremarkable, x-rays are negative. Given increased tone in the right lower extremity this  May represent a spasm with position change. Increased baclofen to 10 mg twice a day  Over half of the 25 min visit was spent counseling and coordinating care. Discussed inpatient care with the patient's daughter as well as her significant other  RTC 5 weeks

## 2013-09-16 ENCOUNTER — Ambulatory Visit: Payer: Medicaid Other | Attending: Occupational Therapy | Admitting: Occupational Therapy

## 2013-09-16 ENCOUNTER — Ambulatory Visit: Payer: Medicaid Other

## 2013-09-16 ENCOUNTER — Ambulatory Visit: Payer: Medicaid Other | Admitting: Physical Therapy

## 2013-09-22 ENCOUNTER — Ambulatory Visit: Payer: No Typology Code available for payment source | Attending: Internal Medicine | Admitting: Internal Medicine

## 2013-09-22 ENCOUNTER — Encounter: Payer: Self-pay | Admitting: Internal Medicine

## 2013-09-22 VITALS — BP 133/78 | HR 85 | Temp 97.8°F | Resp 16 | Ht 65.0 in | Wt 136.0 lb

## 2013-09-22 DIAGNOSIS — E1149 Type 2 diabetes mellitus with other diabetic neurological complication: Secondary | ICD-10-CM | POA: Insufficient documentation

## 2013-09-22 DIAGNOSIS — R482 Apraxia: Secondary | ICD-10-CM

## 2013-09-22 DIAGNOSIS — I10 Essential (primary) hypertension: Secondary | ICD-10-CM | POA: Insufficient documentation

## 2013-09-22 DIAGNOSIS — I69920 Aphasia following unspecified cerebrovascular disease: Secondary | ICD-10-CM

## 2013-09-22 DIAGNOSIS — R4701 Aphasia: Secondary | ICD-10-CM | POA: Insufficient documentation

## 2013-09-22 DIAGNOSIS — IMO0002 Reserved for concepts with insufficient information to code with codable children: Secondary | ICD-10-CM

## 2013-09-22 DIAGNOSIS — R488 Other symbolic dysfunctions: Secondary | ICD-10-CM | POA: Insufficient documentation

## 2013-09-22 DIAGNOSIS — G8110 Spastic hemiplegia affecting unspecified side: Secondary | ICD-10-CM | POA: Insufficient documentation

## 2013-09-22 DIAGNOSIS — I69390 Apraxia following cerebral infarction: Secondary | ICD-10-CM

## 2013-09-22 LAB — GLUCOSE, POCT (MANUAL RESULT ENTRY): POC GLUCOSE: 133 mg/dL — AB (ref 70–99)

## 2013-09-22 NOTE — Patient Instructions (Signed)
Hypertension Hypertension, commonly called high blood pressure, is when the force of blood pumping through your arteries is too strong. Your arteries are the blood vessels that carry blood from your heart throughout your body. A blood pressure reading consists of a higher number over a lower number, such as 110/72. The higher number (systolic) is the pressure inside your arteries when your heart pumps. The lower number (diastolic) is the pressure inside your arteries when your heart relaxes. Ideally you want your blood pressure below 120/80. Hypertension forces your heart to work harder to pump blood. Your arteries may become narrow or stiff. Having hypertension puts you at risk for heart disease, stroke, and other problems.  RISK FACTORS Some risk factors for high blood pressure are controllable. Others are not.  Risk factors you cannot control include:   Race. You may be at higher risk if you are African American.  Age. Risk increases with age.  Gender. Men are at higher risk than women before age 45 years. After age 65, women are at higher risk than men. Risk factors you can control include:  Not getting enough exercise or physical activity.  Being overweight.  Getting too much fat, sugar, calories, or salt in your diet.  Drinking too much alcohol. SIGNS AND SYMPTOMS Hypertension does not usually cause signs or symptoms. Extremely high blood pressure (hypertensive crisis) may cause headache, anxiety, shortness of breath, and nosebleed. DIAGNOSIS  To check if you have hypertension, your health care provider will measure your blood pressure while you are seated, with your arm held at the level of your heart. It should be measured at least twice using the same arm. Certain conditions can cause a difference in blood pressure between your right and left arms. A blood pressure reading that is higher than normal on one occasion does not mean that you need treatment. If one blood pressure reading  is high, ask your health care provider about having it checked again. TREATMENT  Treating high blood pressure includes making lifestyle changes and possibly taking medication. Living a healthy lifestyle can help lower high blood pressure. You may need to change some of your habits. Lifestyle changes may include:  Following the DASH diet. This diet is high in fruits, vegetables, and whole grains. It is low in salt, red meat, and added sugars.  Getting at least 2 1/2 hours of brisk physical activity every week.  Losing weight if necessary.  Not smoking.  Limiting alcoholic beverages.  Learning ways to reduce stress. If lifestyle changes are not enough to get your blood pressure under control, your health care provider may prescribe medicine. You may need to take more than one. Work closely with your health care provider to understand the risks and benefits. HOME CARE INSTRUCTIONS  Have your blood pressure rechecked as directed by your health care provider.   Only take medicine as directed by your health care provider. Follow the directions carefully. Blood pressure medicines must be taken as prescribed. The medicine does not work as well when you skip doses. Skipping doses also puts you at risk for problems.   Do not smoke.   Monitor your blood pressure at home as directed by your health care provider. SEEK MEDICAL CARE IF:   You think you are having a reaction to medicines taken.  You have recurrent headaches or feel dizzy.  You have swelling in your ankles.  You have trouble with your vision. SEEK IMMEDIATE MEDICAL CARE IF:  You develop a severe headache or   confusion.  You have unusual weakness, numbness, or feel faint.  You have severe chest or abdominal pain.  You vomit repeatedly.  You have trouble breathing. MAKE SURE YOU:   Understand these instructions.  Will watch your condition.  Will get help right away if you are not doing well or get  worse. Document Released: 03/06/2005 Document Revised: 03/11/2013 Document Reviewed: 12/27/2012 [employer] Patient Information 2015 [employer]. This information is not intended to replace advice given to you by your health care provider. Make sure you discuss any questions you have with your health care provider.  

## 2013-09-22 NOTE — Progress Notes (Signed)
Patient presents for F/U DM and HTN Arrived via W/C with daughter Pt is S/P CVA as of 05/27/13 with right sided weakness and impaired speech.

## 2013-09-22 NOTE — Progress Notes (Signed)
Patient ID: [name], female   DOB: 02/27/1961, 53 y.o.   MRN: [id number]  CC: HTN   HPI: Patient presents to clinic today for four-week followup for hypertension. Patient is status post CVA with right side hemiparesis.  Patient is currently wheelchair-bound and receives 24-hour care from her daughter.  Daughter present today with a daily blood pressure and CBG log.  Blood pressures have decreased significantly since last visit. Blood pressure ranges from 622-297 systolic. Patient reports mouth relief and hip pain since baclofen was increased by physical therapist. Patient has had PEG tube removed since last visit and it is alert in by mouth intake quite well. Patient continues to have apraxia and expressive aphasia.  No Known Allergies Past Medical History  Diagnosis Date  . Hypertension   . Depression   . Diabetes mellitus     ?type (05/29/2013)  . Bipolar affective disorder   . Schizophrenia     [name] 11/28/2007 (05/29/2013)  . Stroke 05/27/2013   Current Outpatient Prescriptions on File Prior to Visit  Medication Sig Dispense Refill  . acetaminophen (TYLENOL) 325 MG tablet Take 1-2 tablets (325-650 mg total) by mouth every 4 (four) hours as needed for mild pain (available over the counter).      Marland Kitchen amLODipine (NORVASC) 10 MG tablet Take 1 tablet (10 mg total) by mouth daily. For blood pressure  30 tablet  1  . aspirin 325 MG tablet Place 1 tablet (325 mg total) into feeding tube daily.  30 tablet  1  . baclofen (LIORESAL) 10 MG tablet Take 1 tablet (10 mg total) by mouth 2 (two) times daily. To help with tone/spasms  60 tablet  1  . gabapentin (NEURONTIN) 100 MG capsule Take 1 capsule (100 mg total) by mouth 3 (three) times daily. BY mouth or PEG.  90 capsule  1  . hydrochlorothiazide (HYDRODIURIL) 12.5 MG tablet Take 2 tablets (25 mg total) by mouth daily.  30 tablet  1  . Insulin Glargine (LANTUS SOLOSTAR) 100 UNIT/ML Solostar Pen Inject 20 Units into the skin 2 (two) times daily.  With breakfast and supper  15 pen  3  . labetalol (NORMODYNE) 300 MG tablet 1 tablet (300 mg total) by Per NG tube route 3 (three) times daily.  90 tablet  1  . polyethylene glycol powder (GLYCOLAX/MIRALAX) powder Take 17 g by mouth daily.  527 g  2  . simvastatin (ZOCOR) 20 MG tablet Place 1 tablet (20 mg total) into feeding tube daily at 6 PM.  30 tablet  1  . chlorhexidine (PERIDEX) 0.12 % solution 15 mLs by Mouth Rinse route 2 (two) times daily. After lunch and supper  120 mL  0  . Nutritional Supplements (FEEDING SUPPLEMENT, GLUCERNA 1.2 CAL,) LIQD Place 350 mLs into feeding tube at bedtime.  1000 mL  1  . Nutritional Supplements (FEEDING SUPPLEMENT, GLUCERNA 1.2 CAL,) LIQD Place 350 mLs into feeding tube 3 (three) times daily as needed (if patient eats less than 50% of her meal).  1500 mL  1  . Water For Irrigation, Sterile (FREE WATER) SOLN Place 200 mLs into feeding tube 2 (two) times daily. Use filtered water.  1000 mL  1   No current facility-administered medications on file prior to visit.   Family History  Problem Relation Age of Onset  . Hypertension Mother   . Stroke Father   . Hypertension Father   . Diabetes Father    History   Social History  . Marital  Status: Single    Spouse Name: N/A    Number of Children: N/A  . Years of Education: N/A   Occupational History  . Not on file.   Social History Main Topics  . Smoking status: Never Smoker   . Smokeless tobacco: Never Used  . Alcohol Use: No  . Drug Use: No  . Sexual Activity: Not on file   Other Topics Concern  . Not on file   Social History Narrative  . No narrative on file    Review of Systems: Unable to obtain full ROS due to apashia   Objective:   Filed Vitals:   09/22/13 1211  BP: 133/78  Pulse: 85  Temp: 97.8 F (36.6 C)  Resp: 16   Physical Exam  Constitutional: She is oriented to person, place, and time.  Cardiovascular: Normal rate, regular rhythm and normal heart sounds.    Pulmonary/Chest: Effort normal and breath sounds normal.  Abdominal: Soft. Bowel sounds are normal.  Musculoskeletal: She exhibits edema (trace edema in RUE). She exhibits no tenderness.  Flaccid right side   Neurological: She is alert and oriented to person, place, and time. She displays normal reflexes. She exhibits normal muscle tone (right side).  Skin: Skin is warm and dry.     Lab Results  Component Value Date   WBC 8.0 06/25/2013   HGB 12.3 06/25/2013   HCT 36.7 06/25/2013   MCV 90.0 06/25/2013   PLT 337 06/25/2013   Lab Results  Component Value Date   CREATININE 0.63 07/07/2013   BUN 14 07/07/2013   NA 141 07/07/2013   K 3.9 07/07/2013   CL 103 07/07/2013   CO2 26 07/07/2013    Lab Results  Component Value Date   HGBA1C 6.8 08/22/2013   Lipid Panel     Component Value Date/Time   CHOL 201* 05/28/2013 0300   TRIG 145 05/28/2013 0300   HDL 42 05/28/2013 0300   CHOLHDL 4.8 05/28/2013 0300   VLDL 29 05/28/2013 0300   LDLCALC 130* 05/28/2013 0300       Assessment and plan:   [name] was seen today for follow-up, diabetes and hypertension.  Diagnoses and associated orders for this visit:  Essential hypertension Continue current therapy DM (diabetes mellitus) type II controlled, neurological manifestation - Glucose (CBG) Continue current therapy Aphasia complicating stroke Continue with speech therapy  Apraxia following cerebrovascular accident 10 units with PT/OT Spastic hemiplegia affecting dominant side   Return in about 3 months (around 12/23/2013).       Chari Manning, NP-C [facility] [phone] 09/22/2013, 12:39 PM

## 2013-10-10 ENCOUNTER — Ambulatory Visit: Payer: Medicaid Other | Attending: Internal Medicine

## 2013-10-10 DIAGNOSIS — R262 Difficulty in walking, not elsewhere classified: Secondary | ICD-10-CM | POA: Diagnosis not present

## 2013-10-10 DIAGNOSIS — I69959 Hemiplegia and hemiparesis following unspecified cerebrovascular disease affecting unspecified side: Secondary | ICD-10-CM | POA: Diagnosis not present

## 2013-10-10 DIAGNOSIS — IMO0001 Reserved for inherently not codable concepts without codable children: Secondary | ICD-10-CM | POA: Diagnosis not present

## 2013-10-10 DIAGNOSIS — R279 Unspecified lack of coordination: Secondary | ICD-10-CM | POA: Diagnosis not present

## 2013-10-13 ENCOUNTER — Ambulatory Visit: Payer: Medicaid Other | Admitting: Occupational Therapy

## 2013-10-13 DIAGNOSIS — IMO0001 Reserved for inherently not codable concepts without codable children: Secondary | ICD-10-CM | POA: Diagnosis not present

## 2013-10-15 ENCOUNTER — Emergency Department (HOSPITAL_COMMUNITY)
Admission: EM | Admit: 2013-10-15 | Discharge: 2013-10-16 | Disposition: A | Discharge status: Home / Self Care | Payer: Medicaid Other | Attending: Emergency Medicine | Admitting: Emergency Medicine

## 2013-10-15 ENCOUNTER — Emergency Department (HOSPITAL_COMMUNITY): Payer: Medicaid Other

## 2013-10-15 ENCOUNTER — Encounter (HOSPITAL_COMMUNITY): Payer: Self-pay | Admitting: Emergency Medicine

## 2013-10-15 DIAGNOSIS — R5381 Other malaise: Secondary | ICD-10-CM | POA: Insufficient documentation

## 2013-10-15 DIAGNOSIS — Z8673 Personal history of transient ischemic attack (TIA), and cerebral infarction without residual deficits: Secondary | ICD-10-CM | POA: Diagnosis not present

## 2013-10-15 DIAGNOSIS — E876 Hypokalemia: Secondary | ICD-10-CM | POA: Diagnosis not present

## 2013-10-15 DIAGNOSIS — E119 Type 2 diabetes mellitus without complications: Secondary | ICD-10-CM | POA: Diagnosis not present

## 2013-10-15 DIAGNOSIS — F319 Bipolar disorder, unspecified: Secondary | ICD-10-CM | POA: Insufficient documentation

## 2013-10-15 DIAGNOSIS — R209 Unspecified disturbances of skin sensation: Secondary | ICD-10-CM | POA: Diagnosis present

## 2013-10-15 DIAGNOSIS — Z7982 Long term (current) use of aspirin: Secondary | ICD-10-CM | POA: Diagnosis not present

## 2013-10-15 DIAGNOSIS — I10 Essential (primary) hypertension: Secondary | ICD-10-CM | POA: Diagnosis not present

## 2013-10-15 DIAGNOSIS — R5383 Other fatigue: Secondary | ICD-10-CM | POA: Diagnosis not present

## 2013-10-15 DIAGNOSIS — R531 Weakness: Secondary | ICD-10-CM

## 2013-10-15 DIAGNOSIS — Z794 Long term (current) use of insulin: Secondary | ICD-10-CM | POA: Insufficient documentation

## 2013-10-15 DIAGNOSIS — Z79899 Other long term (current) drug therapy: Secondary | ICD-10-CM | POA: Diagnosis not present

## 2013-10-15 LAB — DIFFERENTIAL
Basophils Absolute: 0 10*3/uL (ref 0.0–0.1)
Basophils Relative: 0 % (ref 0–1)
EOS ABS: 0.2 10*3/uL (ref 0.0–0.7)
EOS PCT: 2 % (ref 0–5)
LYMPHS ABS: 2.9 10*3/uL (ref 0.7–4.0)
Lymphocytes Relative: 48 % — ABNORMAL HIGH (ref 12–46)
MONO ABS: 0.4 10*3/uL (ref 0.1–1.0)
MONOS PCT: 6 % (ref 3–12)
Neutro Abs: 2.8 10*3/uL (ref 1.7–7.7)
Neutrophils Relative %: 44 % (ref 43–77)

## 2013-10-15 LAB — COMPREHENSIVE METABOLIC PANEL
ALT: 19 U/L (ref 0–35)
ANION GAP: 14 (ref 5–15)
AST: 21 U/L (ref 0–37)
Albumin: 4 g/dL (ref 3.5–5.2)
Alkaline Phosphatase: 70 U/L (ref 39–117)
BUN: 12 mg/dL (ref 6–23)
CALCIUM: 9.8 mg/dL (ref 8.4–10.5)
CO2: 28 mEq/L (ref 19–32)
CREATININE: 0.75 mg/dL (ref 0.50–1.10)
Chloride: 100 mEq/L (ref 96–112)
GFR calc non Af Amer: >90 mL/min (ref >90)
GLUCOSE: 139 mg/dL — AB (ref 70–99)
Potassium: 3.6 mEq/L — ABNORMAL LOW (ref 3.7–5.3)
Sodium: 142 mEq/L (ref 137–147)
TOTAL PROTEIN: 8 g/dL (ref 6.0–8.3)
Total Bilirubin: 0.4 mg/dL (ref 0.3–1.2)

## 2013-10-15 LAB — I-STAT CHEM 8, ED
BUN: 12 mg/dL (ref 6–23)
Calcium, Ion: 1.19 mmol/L (ref 1.12–1.23)
Chloride: 98 mEq/L (ref 96–112)
Creatinine, Ser: 0.9 mg/dL (ref 0.50–1.10)
GLUCOSE: 143 mg/dL — AB (ref 70–99)
HEMATOCRIT: 43 % (ref 36.0–46.0)
HEMOGLOBIN: 14.6 g/dL (ref 12.0–15.0)
POTASSIUM: 3.2 meq/L — AB (ref 3.7–5.3)
Sodium: 142 mEq/L (ref 137–147)
TCO2: 27 mmol/L (ref 0–100)

## 2013-10-15 LAB — CBC
HCT: 38.7 % (ref 36.0–46.0)
HEMOGLOBIN: 13 g/dL (ref 12.0–15.0)
MCH: 29.3 pg (ref 26.0–34.0)
MCHC: 33.6 g/dL (ref 30.0–36.0)
MCV: 87.4 fL (ref 78.0–100.0)
Platelets: 311 10*3/uL (ref 150–400)
RBC: 4.43 MIL/uL (ref 3.87–5.11)
RDW: 12.8 % (ref 11.5–15.5)
WBC: 6.3 10*3/uL (ref 4.0–10.5)

## 2013-10-15 LAB — PROTIME-INR
INR: 0.95 (ref 0.00–1.49)
Prothrombin Time: 12.7 seconds (ref 11.6–15.2)

## 2013-10-15 LAB — I-STAT TROPONIN, ED: Troponin i, poc: 0 ng/mL (ref 0.00–0.08)

## 2013-10-15 LAB — CBG MONITORING, ED: Glucose-Capillary: 140 mg/dL — ABNORMAL HIGH (ref 70–99)

## 2013-10-15 LAB — APTT: aPTT: 30 seconds (ref 24–37)

## 2013-10-15 LAB — ETHANOL

## 2013-10-15 MED ORDER — POTASSIUM CHLORIDE CRYS ER 20 MEQ PO TBCR: 20.0000 meq | EXTENDED_RELEASE_TABLET | Freq: Once | ORAL | Status: DC

## 2013-10-15 MED ORDER — POTASSIUM CHLORIDE 10 MEQ/100ML IV SOLN
10.0000 meq | Freq: Once | INTRAVENOUS | Status: AC
Start: 2013-10-15 — End: 2013-10-16
  Administered 2013-10-15: 10 meq via INTRAVENOUS
  Filled 2013-10-15: qty 100

## 2013-10-15 MED ORDER — POTASSIUM CHLORIDE 10 MEQ/100ML IV SOLN: 10.0000 meq | Freq: Once | INTRAVENOUS | Status: DC

## 2013-10-15 NOTE — ED Notes (Signed)
Rapid Response at bedside.

## 2013-10-15 NOTE — ED Notes (Signed)
When this RN went in to assess pt, pt noted to have expressive aphasia.  Only says "thank you".

## 2013-10-15 NOTE — ED Provider Notes (Signed)
20:48- she was seen originally at triage, for concern of acute CVA. Patient's daughter brought her here by private vehicle for evaluation of left facial numbness, confusion, and inability to smile, left face. The patient's daughter reports that her symptoms improved, by the time EMS arrived at her home. The patient's daughter feels like her symptoms are at baseline, at this time. EMS was called to the home. The patient did not want to go with them, because she was scared. The patient had a left brain stroke with right-sided hemiplegia, 2 months ago.  Brief physical examination-patient is alert, responsive, and answers simple questions. There is no left facial droop. There is no left facial numbness to light touch. She has good extension strength of the left shoulder and left hip while seated in a wheelchair.  Evaluation for TPA- the patient's neurologic symptoms, which began at 19:50, today, have resolved. There is no indication for use of thrombolytic therapy at this time. The patient does not meet criteria for code stroke.  Routine orders given for TIA evaluation  Richarda Blade, MD 10/15/13 2059

## 2013-10-15 NOTE — Discharge Instructions (Signed)

## 2013-10-15 NOTE — ED Notes (Signed)
Per daughter at bedside, pt's only response is "thank you".

## 2013-10-15 NOTE — ED Notes (Signed)
Per daughter, pt has dysphagia diet at home.

## 2013-10-15 NOTE — ED Notes (Signed)
The pt has had numbness and tingkling in the lt side of her face 1950.  She had a stroke march 2015.  She has a rt sided residual paralysis.  Dr Eulis Foster here to check the pt in triage

## 2013-10-16 NOTE — ED Provider Notes (Signed)
CSN: [id number]     Arrival date & time 10/15/13  2036 History   First MD Initiated Contact with Patient 10/15/13 2141     Chief Complaint  Patient presents with  . Numbness     (Consider location/radiation/quality/duration/timing/severity/associated sxs/prior Treatment) HPI 53 y/o female with PMH of stroke with baseline expressive aphasia and right sided weakness that presents for concern on worsening numbness on the left side of  her face with no other new symptoms. Onset of numbness at 1950, while at rest, duration approximately 2 hours and has resolved, no pain, fever, chills associated with the symtoms.   Past Medical History  Diagnosis Date  . Hypertension   . Depression   . Diabetes mellitus     ?type (05/29/2013)  . Bipolar affective disorder   . Schizophrenia     [name] 11/28/2007 (05/29/2013)  . Stroke 05/27/2013   Past Surgical History  Procedure Laterality Date  . Cesarean section  ~ 1987  . Tubal ligation     Family History  Problem Relation Age of Onset  . Hypertension Mother   . Stroke Father   . Hypertension Father   . Diabetes Father    History  Substance Use Topics  . Smoking status: Never Smoker   . Smokeless tobacco: Never Used  . Alcohol Use: No   OB History   Grav Para Term Preterm Abortions TAB SAB Ect Mult Living                 Review of Systems  Constitutional: Negative for activity change.  HENT: Negative for congestion.   Respiratory: Negative for cough and shortness of breath.   Cardiovascular: Negative for chest pain and leg swelling.  Gastrointestinal: Negative for nausea, vomiting, abdominal pain, diarrhea, constipation, blood in stool and abdominal distention.  Genitourinary: Negative for dysuria, flank pain and vaginal discharge.  Musculoskeletal: Negative for back pain.  Skin: Negative for color change.  Neurological: Positive for facial asymmetry, speech difficulty, weakness and numbness. Negative for syncope and headaches.   Psychiatric/Behavioral: Negative for agitation.      Allergies  Review of patient's allergies indicates no known allergies.  Home Medications   Prior to Admission medications   Medication Sig Start Date End Date Taking? Authorizing Provider  acetaminophen (TYLENOL) 325 MG tablet Take 1-2 tablets (325-650 mg total) by mouth every 4 (four) hours as needed for mild pain (available over the counter). 07/08/13  Yes Ivan Anchors Love, PA-C  amLODipine (NORVASC) 10 MG tablet Take 10 mg by mouth daily. For blood pressure 08/12/13  Yes Charlett Blake, MD  aspirin 325 MG tablet Place 1 tablet (325 mg total) into feeding tube daily. 07/08/13  Yes Ivan Anchors Love, PA-C  baclofen (LIORESAL) 10 MG tablet Take 1 tablet (10 mg total) by mouth 2 (two) times daily. To help with tone/spasms 09/12/13  Yes Charlett Blake, MD  gabapentin (NEURONTIN) 100 MG capsule Take 1 capsule (100 mg total) by mouth 3 (three) times daily. BY mouth or PEG. 08/12/13  Yes Charlett Blake, MD  hydrochlorothiazide (HYDRODIURIL) 25 MG tablet Take 25 mg by mouth daily.   Yes Historical Provider, MD  Insulin Glargine (LANTUS SOLOSTAR) 100 UNIT/ML Solostar Pen Inject 20 Units into the skin 2 (two) times daily. With breakfast and supper 08/22/13  Yes Angelica Chessman, MD  labetalol (NORMODYNE) 300 MG tablet 1 tablet (300 mg total) by Per NG tube route 3 (three) times daily. 08/12/13  Yes Charlett Blake, MD  polyethylene  glycol powder (GLYCOLAX/MIRALAX) powder Take 17 g by mouth daily. 08/22/13  Yes Lance Bosch, NP  simvastatin (ZOCOR) 20 MG tablet Place 1 tablet (20 mg total) into feeding tube daily at 6 PM. 08/12/13  Yes Charlett Blake, MD  Water For Irrigation, Sterile (FREE WATER) SOLN Place 200 mLs into feeding tube 2 (two) times daily. Use filtered water. 07/08/13   Ivan Anchors Love, PA-C   BP 140/80  Pulse 98  Temp(Src) 98.5 F (36.9 C) (Oral)  Resp 21  SpO2 100% Physical Exam  Constitutional: She appears well-developed.   HENT:  Head: Normocephalic.  Eyes: Pupils are equal, round, and reactive to light.  Neck: Neck supple.  Cardiovascular: Normal rate.  Exam reveals no gallop and no friction rub.   No murmur heard. Pulmonary/Chest: Effort normal and breath sounds normal. No respiratory distress.  Abdominal: Soft. She exhibits no distension. There is no tenderness. There is no rebound.  Musculoskeletal: She exhibits no edema.  Neurological: She is alert.  Expressive aphasia but indicated understanding  Right side weakness (baseline) , facial droop to right, no current numbness to face.   Skin: Skin is warm.  Psychiatric: She has a normal mood and affect.    ED Course  Procedures (including critical care time) Labs Review Labs Reviewed  DIFFERENTIAL - Abnormal; Notable for the following:    Lymphocytes Relative 48 (*)    All other components within normal limits  COMPREHENSIVE METABOLIC PANEL - Abnormal; Notable for the following:    Potassium 3.6 (*)    Glucose, Bld 139 (*)    All other components within normal limits  I-STAT CHEM 8, ED - Abnormal; Notable for the following:    Potassium 3.2 (*)    Glucose, Bld 143 (*)    All other components within normal limits  CBG MONITORING, ED - Abnormal; Notable for the following:    Glucose-Capillary 140 (*)    All other components within normal limits  ETHANOL  PROTIME-INR  APTT  CBC  URINE RAPID DRUG SCREEN (HOSP PERFORMED)  URINALYSIS, ROUTINE W REFLEX MICROSCOPIC  I-STAT TROPOININ, ED  I-STAT TROPOININ, ED    Imaging Review Ct Head Wo Contrast  10/15/2013   CLINICAL DATA:  Numbness and tingling in the left side of the face. Prior stroke.  EXAM: CT HEAD WITHOUT CONTRAST  TECHNIQUE: Contiguous axial images were obtained from the base of the skull through the vertex without intravenous contrast.  COMPARISON:  05/30/2013  FINDINGS: There is no acute intracranial hemorrhage or infarction or mass lesion. The patient has an extensive old left middle  cerebral artery distribution infarct with secondary encephalomalacia. No midline shift. Left lateral ventricle is slightly dilated as compared to the right secondary to the prior infarct. There is chronic periventricular white matter lucency in the right frontal lobe consistent with chronic small vessel ischemic disease.  No osseous abnormality.  IMPRESSION: No acute intracranial abnormality.  Old left MCA infarct.   Electronically Signed   By: Rozetta Nunnery M.D.   On: 10/15/2013 21:25     EKG Interpretation   Date/Time:  Wednesday October 15 2013 21:47:02 EDT Ventricular Rate:  96 PR Interval:  139 QRS Duration: 76 QT Interval:  342 QTC Calculation: 432 R Axis:   40 Text Interpretation:  Sinus rhythm ED PHYSICIAN INTERPRETATION AVAILABLE  IN [facility] Confirmed by TEST, Record ([id number]) on 10/17/2013 7:05:49  AM      MDM   Final diagnoses:  Weakness  Hypokalemia   52  y/o female with baseline significant neuro deficits from recent stroke presents with face tingling that has now resolved. Patient evaluated with CT head-unremarkable for acute process and screening labs that indicated hypokalemia, replacement given in the department. EKG- sinus rhythm  Patient discharged after potassium treatment with return precautions given.     Claudean Severance, MD 10/17/13 1321

## 2013-10-18 NOTE — ED Provider Notes (Signed)
This patient was seen in conjunction with the resident physician. The documentation accurately reflects the patient's encounter in the emergency department. On my exam, this patient was in no distress.  She did have a notable dysphagia, which according to the patient's daughter was typical for her following stroke. Patient presents after an episode of possible dysesthesia, seemingly resolved.  According to patient and daughter, patient is neurologically at baseline.  Evaluation was largely reassuring, with evidence for only hypokalemia as a notable abnormality.  After repletion in the emergency department, and several hours of monitoring, with no decompensation, no new neurologic complaints, patient was discharged in stable condition to follow up with her primary care team.  EKG showed sinus rhythm, rate 96, unremarkable   Carmin Muskrat, MD 10/18/13 [phone]

## 2013-10-20 ENCOUNTER — Other Ambulatory Visit: Payer: Self-pay | Admitting: Physical Medicine & Rehabilitation

## 2013-10-20 ENCOUNTER — Other Ambulatory Visit: Payer: Self-pay | Admitting: Internal Medicine

## 2013-10-24 ENCOUNTER — Ambulatory Visit: Payer: Medicaid Other | Attending: Occupational Therapy

## 2013-10-24 DIAGNOSIS — R262 Difficulty in walking, not elsewhere classified: Secondary | ICD-10-CM | POA: Insufficient documentation

## 2013-10-24 DIAGNOSIS — I69959 Hemiplegia and hemiparesis following unspecified cerebrovascular disease affecting unspecified side: Secondary | ICD-10-CM | POA: Insufficient documentation

## 2013-10-24 DIAGNOSIS — R279 Unspecified lack of coordination: Secondary | ICD-10-CM | POA: Insufficient documentation

## 2013-10-24 DIAGNOSIS — IMO0001 Reserved for inherently not codable concepts without codable children: Secondary | ICD-10-CM | POA: Insufficient documentation

## 2013-11-04 ENCOUNTER — Telehealth: Payer: Self-pay | Admitting: *Deleted

## 2013-11-04 NOTE — Telephone Encounter (Signed)
Case manager called requesting orders for [employer]: ADULT DEPENDS and BED PADS.  Please make LCSW aware so that I can follow up.  Christene Lye MSW, LCSW

## 2013-11-06 ENCOUNTER — Ambulatory Visit: Payer: No Typology Code available for payment source | Admitting: Physical Medicine & Rehabilitation

## 2013-11-06 ENCOUNTER — Other Ambulatory Visit (HOSPITAL_COMMUNITY)
Admission: RE | Admit: 2013-11-06 | Discharge: 2013-11-06 | Disposition: A | Discharge status: Home / Self Care | Payer: Medicaid Other | Source: Ambulatory Visit | Attending: Internal Medicine | Admitting: Internal Medicine

## 2013-11-06 ENCOUNTER — Encounter: Payer: Self-pay | Admitting: Internal Medicine

## 2013-11-06 ENCOUNTER — Ambulatory Visit: Payer: Medicaid Other | Attending: Internal Medicine | Admitting: Internal Medicine

## 2013-11-06 ENCOUNTER — Other Ambulatory Visit: Payer: Self-pay

## 2013-11-06 VITALS — BP 133/88 | HR 94 | Temp 98.3°F | Resp 16 | Ht 65.0 in | Wt 136.2 lb

## 2013-11-06 DIAGNOSIS — Z113 Encounter for screening for infections with a predominantly sexual mode of transmission: Secondary | ICD-10-CM | POA: Insufficient documentation

## 2013-11-06 DIAGNOSIS — Z01419 Encounter for gynecological examination (general) (routine) without abnormal findings: Secondary | ICD-10-CM | POA: Diagnosis present

## 2013-11-06 DIAGNOSIS — E876 Hypokalemia: Secondary | ICD-10-CM | POA: Diagnosis not present

## 2013-11-06 DIAGNOSIS — Z1151 Encounter for screening for human papillomavirus (HPV): Secondary | ICD-10-CM | POA: Insufficient documentation

## 2013-11-06 DIAGNOSIS — Z7982 Long term (current) use of aspirin: Secondary | ICD-10-CM | POA: Diagnosis not present

## 2013-11-06 DIAGNOSIS — Z8673 Personal history of transient ischemic attack (TIA), and cerebral infarction without residual deficits: Secondary | ICD-10-CM | POA: Insufficient documentation

## 2013-11-06 DIAGNOSIS — N760 Acute vaginitis: Secondary | ICD-10-CM | POA: Diagnosis present

## 2013-11-06 DIAGNOSIS — N898 Other specified noninflammatory disorders of vagina: Secondary | ICD-10-CM | POA: Insufficient documentation

## 2013-11-06 DIAGNOSIS — I10 Essential (primary) hypertension: Secondary | ICD-10-CM | POA: Insufficient documentation

## 2013-11-06 DIAGNOSIS — Z931 Gastrostomy status: Secondary | ICD-10-CM | POA: Insufficient documentation

## 2013-11-06 DIAGNOSIS — Z794 Long term (current) use of insulin: Secondary | ICD-10-CM | POA: Diagnosis not present

## 2013-11-06 DIAGNOSIS — Z0000 Encounter for general adult medical examination without abnormal findings: Secondary | ICD-10-CM

## 2013-11-06 DIAGNOSIS — Z1231 Encounter for screening mammogram for malignant neoplasm of breast: Secondary | ICD-10-CM

## 2013-11-06 DIAGNOSIS — Z124 Encounter for screening for malignant neoplasm of cervix: Secondary | ICD-10-CM

## 2013-11-06 DIAGNOSIS — E1149 Type 2 diabetes mellitus with other diabetic neurological complication: Secondary | ICD-10-CM | POA: Diagnosis not present

## 2013-11-06 DIAGNOSIS — R4701 Aphasia: Secondary | ICD-10-CM | POA: Insufficient documentation

## 2013-11-06 DIAGNOSIS — Z1239 Encounter for other screening for malignant neoplasm of breast: Secondary | ICD-10-CM

## 2013-11-06 DIAGNOSIS — E119 Type 2 diabetes mellitus without complications: Secondary | ICD-10-CM | POA: Insufficient documentation

## 2013-11-06 DIAGNOSIS — Z742 Need for assistance at home and no other household member able to render care: Secondary | ICD-10-CM

## 2013-11-06 LAB — BASIC METABOLIC PANEL
BUN: 11 mg/dL (ref 6–23)
CO2: 28 mEq/L (ref 19–32)
CREATININE: 0.7 mg/dL (ref 0.50–1.10)
Calcium: 9.4 mg/dL (ref 8.4–10.5)
Chloride: 98 mEq/L (ref 96–112)
Glucose, Bld: 122 mg/dL — ABNORMAL HIGH (ref 70–99)
POTASSIUM: 3.9 meq/L (ref 3.5–5.3)
Sodium: 138 mEq/L (ref 135–145)

## 2013-11-06 NOTE — Progress Notes (Signed)
Patient ID: [name], female   DOB: [birth year], 53 y.o.   MRN: [id number]  CC: hypokalemia, pap  HPI:  [name] is a 53 year female that presents today for a ED follow up of hypokalemia.  Patient was seen in the ED for c/o of numbness and tingling of her face and was found to be hypokalemic.  Potassium was replaced in the ER before discharge.  She continues to have expressive asphasia and is present with her daughter who is her caregiver.  Her daughter states that the patient has had a thick white discharge for a couple of weeks and she is in need of a pap smear and mammogram.  Unable to determine if the patient has any associated itch or symptoms.      No Known Allergies Past Medical History  Diagnosis Date  . Hypertension   . Depression   . Diabetes mellitus     ?type (05/29/2013)  . Bipolar affective disorder   . Schizophrenia     [name] 11/28/2007 (05/29/2013)  . Stroke 05/27/2013   Current Outpatient Prescriptions on File Prior to Visit  Medication Sig Dispense Refill  . acetaminophen (TYLENOL) 325 MG tablet Take 1-2 tablets (325-650 mg total) by mouth every 4 (four) hours as needed for mild pain (available over the counter).      Marland Kitchen amLODipine (NORVASC) 10 MG tablet Take 10 mg by mouth daily. For blood pressure      . aspirin 325 MG tablet Place 1 tablet (325 mg total) into feeding tube daily.  30 tablet  1  . baclofen (LIORESAL) 10 MG tablet Take 1 tablet (10 mg total) by mouth 2 (two) times daily. To help with tone/spasms  60 tablet  1  . gabapentin (NEURONTIN) 100 MG capsule Take 1 capsule (100 mg total) by mouth 3 (three) times daily. BY mouth or PEG.  90 capsule  1  . hydrochlorothiazide (HYDRODIURIL) 25 MG tablet Take 25 mg by mouth daily.      . Insulin Glargine (LANTUS SOLOSTAR) 100 UNIT/ML Solostar Pen Inject 20 Units into the skin 2 (two) times daily. With breakfast and supper  15 pen  3  . labetalol (NORMODYNE) 300 MG tablet 1 tablet (300 mg total) by Per NG tube  route 3 (three) times daily.  90 tablet  1  . polyethylene glycol powder (GLYCOLAX/MIRALAX) powder Take 17 g by mouth daily.  527 g  2  . simvastatin (ZOCOR) 20 MG tablet Place 1 tablet (20 mg total) into feeding tube daily at 6 PM.  30 tablet  1  . Water For Irrigation, Sterile (FREE WATER) SOLN Place 200 mLs into feeding tube 2 (two) times daily. Use filtered water.  1000 mL  1   No current facility-administered medications on file prior to visit.   Family History  Problem Relation Age of Onset  . Hypertension Mother   . Stroke Father   . Hypertension Father   . Diabetes Father    History   Social History  . Marital Status: Single    Spouse Name: N/A    Number of Children: N/A  . Years of Education: N/A   Occupational History  . Not on file.   Social History Main Topics  . Smoking status: Never Smoker   . Smokeless tobacco: Never Used  . Alcohol Use: No  . Drug Use: No  . Sexual Activity: Not on file   Other Topics Concern  . Not on file   Social History  Narrative  . No narrative on file   Review of Systems  Unable to perform ROS     Objective:   Filed Vitals:   11/06/13 1732  BP: 133/88  Pulse: 94  Temp: 98.3 F (36.8 C)  Resp: 16     Physical Exam  Constitutional: She appears well-developed.  HENT:  Head: Normocephalic.  Eyes: Pupils are equal, round, and reactive to light.  Neck: Neck supple.  Cardiovascular: Normal rate. Exam reveals no gallop and no friction rub.  No murmur heard.  Pulmonary/Chest: Effort normal and breath sounds normal. No respiratory distress.  Abdominal: Soft. She exhibits no distension. There is no tenderness. There is no rebound.  Musculoskeletal: She exhibits no edema.  Neurological: She is alert.  Expressive aphasia but indicated understanding  Right side weakness (baseline) , facial droop to right, no current numbness to face.  Skin: Skin is warm.  Psychiatric: She has a normal mood and affect.   Physical Exam   Pulmonary/Chest: Right breast exhibits no inverted nipple, no mass, no nipple discharge and no tenderness. Left breast exhibits no inverted nipple, no mass, no nipple discharge and no tenderness.  Genitourinary: Uterus normal. Cervix exhibits friability. Cervix exhibits no motion tenderness and no discharge. Right adnexum displays no fullness. Left adnexum displays no fullness. Vaginal discharge (thick white) found.  Lymphadenopathy:       Right: No inguinal adenopathy present.       Left: No inguinal adenopathy present.     Lab Results  Component Value Date   WBC 6.3 10/15/2013   HGB 14.6 10/15/2013   HCT 43.0 10/15/2013   MCV 87.4 10/15/2013   PLT 311 10/15/2013   Lab Results  Component Value Date   CREATININE 0.90 10/15/2013   BUN 12 10/15/2013   NA 142 10/15/2013   K 3.2* 10/15/2013   CL 98 10/15/2013   CO2 28 10/15/2013    Lab Results  Component Value Date   HGBA1C 6.8 08/22/2013   Lipid Panel     Component Value Date/Time   CHOL 201* 05/28/2013 0300   TRIG 145 05/28/2013 0300   HDL 42 05/28/2013 0300   CHOLHDL 4.8 05/28/2013 0300   VLDL 29 05/28/2013 0300   LDLCALC 130* 05/28/2013 0300       Assessment and plan:   Rodneshia was seen today for follow-up.  Diagnoses and associated orders for this visit:  Hypokalemia - Basic Metabolic Panel  DM (diabetes mellitus) type II controlled, neurological manifestation - Ambulatory referral to Podiatry-needs annual foot exam  Papanicolaou smear - Cytology - PAP [place] - Cervicovaginal ancillary only  Breast cancer screening, high risk patient - MM Digital Screening; Future  Preventative health care - HM COLONOSCOPY  3 month follow up with PCP       Chari Manning, [facility] [phone] 11/06/2013, 5:31 PM

## 2013-11-06 NOTE — Telephone Encounter (Signed)
Prescription for Adult depends and bed pads Printed for social worker per his request.

## 2013-11-06 NOTE — Progress Notes (Signed)
Patient present for f/u on ED visit for low potassium and weakness

## 2013-11-07 ENCOUNTER — Other Ambulatory Visit: Payer: Self-pay | Admitting: Internal Medicine

## 2013-11-07 ENCOUNTER — Telehealth: Payer: Self-pay

## 2013-11-07 DIAGNOSIS — E1149 Type 2 diabetes mellitus with other diabetic neurological complication: Secondary | ICD-10-CM

## 2013-11-07 DIAGNOSIS — G8110 Spastic hemiplegia affecting unspecified side: Secondary | ICD-10-CM

## 2013-11-07 MED ORDER — GABAPENTIN 100 MG PO CAPS: 100.0000 mg | ORAL_CAPSULE | Freq: Three times a day (TID) | ORAL | Status: DC

## 2013-11-07 MED ORDER — BACLOFEN 10 MG PO TABS
10.0000 mg | ORAL_TABLET | Freq: Two times a day (BID) | ORAL | Status: DC
Start: 2013-11-07 — End: 2014-11-06

## 2013-11-07 NOTE — Telephone Encounter (Signed)
Called patient and spoke with daughter Radene Journey Advised her that her mom;s letter From [employer] Was done and ready to be picked up

## 2013-11-10 ENCOUNTER — Telehealth: Payer: Self-pay | Admitting: Emergency Medicine

## 2013-11-10 NOTE — Telephone Encounter (Signed)
Message copied by Ricci Barker on Mon Nov 10, 2013  4:25 PM ------      Message from: Chari Manning A      Created: Fri Nov 07, 2013 10:29 AM       Potassium is normal.  Please let her daughter know that I have sent the information to [facility] and I have taken care of all her prescriptions with [facility]. Please let her know that she can come in to pick up her letter for a handicap accessible living space, it will be on my desk. Make sure she calls Tywan about the living condition as well. Thanks ------

## 2013-11-10 NOTE — Telephone Encounter (Signed)
Pt given mothers lab results with resource information as well. Pt states she is trying to call [facility] but no message returned.

## 2013-11-11 ENCOUNTER — Telehealth: Payer: Self-pay | Admitting: Internal Medicine

## 2013-11-11 ENCOUNTER — Telehealth: Payer: Self-pay | Admitting: Emergency Medicine

## 2013-11-11 LAB — CYTOLOGY - PAP

## 2013-11-11 MED ORDER — METRONIDAZOLE 500 MG PO TABS: 500.0000 mg | ORAL_TABLET | Freq: Two times a day (BID) | ORAL | Status: DC

## 2013-11-11 NOTE — Telephone Encounter (Signed)
Message copied by Ricci Barker on Tue Nov 11, 2013  5:50 PM ------      Message from: Chari Manning A      Created: Tue Nov 11, 2013 12:59 PM       Please call patients daughter and let her know that her mom tested positive for bacterial vaginosis and explain to her what that means. Educate patient daughter that it will be beneficial to stick with dove soap. Please send order for flagyl 500 mg BID for 7 days. No alcohol while on this drug ------

## 2013-11-11 NOTE — Telephone Encounter (Signed)
Patient's contact called in today to ask about authorization for a medication through [employer] [facility]; please f/u with this request; [employer] authorization is [id number]

## 2013-11-14 ENCOUNTER — Ambulatory Visit (HOSPITAL_COMMUNITY)
Admission: RE | Admit: 2013-11-14 | Discharge: 2013-11-14 | Disposition: A | Discharge status: Home / Self Care | Payer: Medicaid Other | Source: Ambulatory Visit | Attending: Internal Medicine | Admitting: Internal Medicine

## 2013-11-14 DIAGNOSIS — Z1239 Encounter for other screening for malignant neoplasm of breast: Secondary | ICD-10-CM

## 2013-11-14 DIAGNOSIS — Z1231 Encounter for screening mammogram for malignant neoplasm of breast: Secondary | ICD-10-CM | POA: Insufficient documentation

## 2013-11-17 ENCOUNTER — Ambulatory Visit (HOSPITAL_BASED_OUTPATIENT_CLINIC_OR_DEPARTMENT_OTHER): Payer: Medicaid Other | Admitting: Physical Medicine & Rehabilitation

## 2013-11-17 ENCOUNTER — Other Ambulatory Visit: Payer: Self-pay | Admitting: Internal Medicine

## 2013-11-17 ENCOUNTER — Encounter: Payer: Self-pay | Admitting: Physical Medicine & Rehabilitation

## 2013-11-17 ENCOUNTER — Encounter: Payer: Medicaid Other | Attending: Physical Medicine & Rehabilitation

## 2013-11-17 VITALS — BP 138/85 | HR 86 | Resp 14 | Ht 65.0 in | Wt 137.0 lb

## 2013-11-17 DIAGNOSIS — I69959 Hemiplegia and hemiparesis following unspecified cerebrovascular disease affecting unspecified side: Secondary | ICD-10-CM | POA: Diagnosis present

## 2013-11-17 DIAGNOSIS — IMO0002 Reserved for concepts with insufficient information to code with codable children: Secondary | ICD-10-CM

## 2013-11-17 DIAGNOSIS — I69390 Apraxia following cerebral infarction: Secondary | ICD-10-CM

## 2013-11-17 DIAGNOSIS — E119 Type 2 diabetes mellitus without complications: Secondary | ICD-10-CM | POA: Diagnosis not present

## 2013-11-17 DIAGNOSIS — R482 Apraxia: Secondary | ICD-10-CM | POA: Insufficient documentation

## 2013-11-17 DIAGNOSIS — I69920 Aphasia following unspecified cerebrovascular disease: Secondary | ICD-10-CM | POA: Insufficient documentation

## 2013-11-17 DIAGNOSIS — G8110 Spastic hemiplegia affecting unspecified side: Secondary | ICD-10-CM

## 2013-11-17 DIAGNOSIS — I10 Essential (primary) hypertension: Secondary | ICD-10-CM | POA: Diagnosis not present

## 2013-11-17 NOTE — Progress Notes (Signed)
Subjective:    Patient ID: [name], female    DOB: April 27, 1960, 53 y.o.   MRN: [id number]  HPI 53 y.o. female with history of HTN, DM, bipolar disorder, who was admitted on 05/27/13 with right facial droop and aphasia. 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 Infarcts felt to be embolic due to unknown source and she was placed on ASA for secondary stroke prevention. Patient had progressive neurologic worsening on 05/29/13 pm with decrease in LOC and right sided weakness. EEG negative for seizures and CCT without significant changes Neurology felt patient with progression of symptoms due to relative hypotension and recommended allowing significantly high permissive hypertension in this patient. Patient with resultant right hemiparesis, severe dysphagia  Also has complained of hip pain on the right side. X-rays performed this month which were normal. Pain is during transfers back to bed in the evening.  Has had PEG tube removed by radiology. Eating well and drinking well.  Also has followed up with internal medicine for blood pressure and other medical issues. Medications have been adjusted specifically hydrochlorothiazide   Went to outpt therapy pre eval Remains aphasic No falls at home Walking with hemi walker kitchen to living room 83feet daughter holds waist belt  Needs assist donning paints Does ok with donning shirt toileting with assist from daughter No swallowing or feeding issues per pt and daughter  Social had to move out of apartment 20 shots came through the window a couple days ago Pain Inventory Average Pain 4 Pain Right Now 0 My pain is intermittent and  sharp  In the last 24 hours, has pain interfered with the following? General activity 4 Relation with others 4 Enjoyment of life 4 What TIME of day is your pain at its worst? morning Sleep (in general) Fair  Pain is worse with: walking, bending, sitting and standing Pain improves with: therapy/exercise and medication Relief from Meds: 4  Mobility walk with assistance use a walker how many minutes can you walk? 2 ability to climb steps?  yes do you drive?  no use a wheelchair needs help with transfers Do you have any goals in this area?  yes  Function not employed: date last employed na disabled: date disabled 05/27/2013 I need assistance with the following:  feeding, dressing, bathing, toileting, meal prep, household duties and shopping Do you have any goals in this area?  yes  Neuro/Psych weakness trouble walking spasms confusion  Prior Studies Any changes since last visit?  no  Physicians involved in your care Any changes since last visit?  yes Primary care Chari Manning   Family History  Problem Relation Age of Onset  . Hypertension Mother   . Stroke Father   . Hypertension Father   . Diabetes Father    History   Social History  . Marital Status: Single    Spouse Name: N/A    Number of Children: N/A  . Years of Education: N/A   Social History Main Topics  . Smoking status: Never Smoker   . Smokeless tobacco: Never Used  . Alcohol Use: No  . Drug Use: No  . Sexual Activity: None   Other Topics Concern  . None   Social History Narrative  .  None   Past Surgical History  Procedure Laterality Date  . Cesarean section  ~ 1987  . Tubal ligation     Past Medical History  Diagnosis Date  . Hypertension   . Depression   . Diabetes mellitus     ?type (05/29/2013)  . Bipolar affective disorder   . Schizophrenia     [name] 11/28/2007 (05/29/2013)  . Stroke 05/27/2013   BP 138/85  Pulse 86  Resp 14  Ht 5\' 5"  (1.651 m)  Wt 137 lb (62.143 kg)   BMI 22.80 kg/m2  SpO2 99%  Opioid Risk Score:   Fall Risk Score: Moderate Fall Risk (6-13 points) (pt educated declined handout)    Review of Systems  Constitutional: Positive for unexpected weight change.  Respiratory: Positive for apnea.   Cardiovascular: Positive for leg swelling.  Gastrointestinal: Positive for constipation.  Endocrine:       High blood sugar  Genitourinary: Positive for dysuria.  Musculoskeletal: Positive for gait problem.  Neurological: Positive for weakness.       Spasms  Psychiatric/Behavioral: Positive for confusion.  All other systems reviewed and are negative.      Objective:   Physical Exam  Nursing note and vitals reviewed. Constitutional: She appears well-developed and well-nourished.  HENT:  Head: Normocephalic and atraumatic.  Eyes: Conjunctivae are normal. Pupils are equal, round, and reactive to light.  Neck: Normal range of motion.   Ashworth grade 3 finger flexor spasticity Ashworth grade 3 pronator spasticity Ashworth grade 3 hamstring spasticity Ashworth grade 2 gastroc spasticity  Patient goes from sit to stand with minimal assistance. She does not achieve foot flat. He'll is off the ground on the right side only  Right upper extremity strength 0/5 deltoid, bicep, tricep, grip Right lower extremity strength 3 minus hip flexor knee extensor ankle dorsiflexor Left upper and left lower extremity strength appear normal however she has apraxia which limits formal manual muscle test  Severe aphasia unable to state name unable to name simple objects difficulty with yes no       Assessment & Plan:  1. Left MCA distribution infarct with right hemiparesis and spasticity which limits function. Scheduled for Botox injection 300 units. 100 units into the wrist and finger flexors, 100 units into the hamstrings, 100 units into the gastroc 2. 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

## 2013-11-17 NOTE — Patient Instructions (Signed)
I am recommending Botox injection to the right forearm as well as right hamstring and right gastroc Will schedule in about a month See the pamphlet

## 2013-11-17 NOTE — Progress Notes (Deleted)
   Subjective:    Patient ID: [name], female    DOB: 07-23-1960, 53 y.o.   MRN: [id number]  HPI    Review of Systems     Objective:   Physical Exam        Assessment & Plan:

## 2013-11-18 ENCOUNTER — Other Ambulatory Visit: Payer: Self-pay | Admitting: Internal Medicine

## 2013-11-18 DIAGNOSIS — R928 Other abnormal and inconclusive findings on diagnostic imaging of breast: Secondary | ICD-10-CM

## 2013-11-20 ENCOUNTER — Telehealth: Payer: Self-pay | Admitting: *Deleted

## 2013-11-20 ENCOUNTER — Encounter: Payer: Self-pay | Admitting: *Deleted

## 2013-11-20 NOTE — Telephone Encounter (Signed)
Left message on VM to return my call. 

## 2013-11-20 NOTE — Telephone Encounter (Signed)
Message copied by Joan Mayans on Thu Nov 20, 2013  9:45 AM ------      Message from: Chari Manning A      Created: Tue Nov 18, 2013  2:32 PM       Patient will need a diagnostic mammogram of the right breast to take a closer look. Right breast calcifications which made it harder  to view breast tissue ------

## 2013-11-20 NOTE — Progress Notes (Signed)
Spoke to pts daughter she is aware of her lab results.
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
## 2013-11-25 ENCOUNTER — Ambulatory Visit (INDEPENDENT_AMBULATORY_CARE_PROVIDER_SITE_OTHER): Payer: Medicaid Other | Admitting: Nurse Practitioner

## 2013-11-25 ENCOUNTER — Encounter: Payer: Self-pay | Admitting: Nurse Practitioner

## 2013-11-25 VITALS — BP 146/89 | HR 120 | Ht 65.0 in | Wt 125.0 lb

## 2013-11-25 DIAGNOSIS — R482 Apraxia: Secondary | ICD-10-CM

## 2013-11-25 DIAGNOSIS — I63512 Cerebral infarction due to unspecified occlusion or stenosis of left middle cerebral artery: Secondary | ICD-10-CM

## 2013-11-25 DIAGNOSIS — I6330 Cerebral infarction due to thrombosis of unspecified cerebral artery: Secondary | ICD-10-CM

## 2013-11-25 DIAGNOSIS — I69390 Apraxia following cerebral infarction: Secondary | ICD-10-CM

## 2013-11-25 DIAGNOSIS — G8110 Spastic hemiplegia affecting unspecified side: Secondary | ICD-10-CM
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
## 2013-11-25 NOTE — Patient Instructions (Signed)
Continue aspirin 325 mg orally every day  for secondary stroke prevention and maintain strict control of hypertension with blood pressure goal below 140/90, diabetes with Hgb A1c with goal below 7.0, and lipids with LDL cholesterol goal below 100 mg/dL. Followup in the future with Dr. Erlinda Hong in 3 months, sooner as needed.  Stroke Prevention Some medical conditions and behaviors are associated with an increased chance of having a stroke. You may prevent a stroke by making healthy choices and managing medical conditions. HOW CAN I REDUCE MY RISK OF HAVING A STROKE?   Stay physically active. Get at least 30 minutes of activity on most or all days.  Do not smoke. It may also be helpful to avoid exposure to secondhand smoke.  Limit alcohol use. Moderate alcohol use is considered to be:  No more than 2 drinks per day for men.  No more than 1 drink per day for nonpregnant women.  Eat healthy foods. This involves:  Eating 5 or more servings of fruits and vegetables a day.  Making dietary changes that address high blood pressure (hypertension), high cholesterol, diabetes, or obesity.  Manage your cholesterol levels.  Making food choices that are high in fiber and low in saturated fat, trans fat, and cholesterol may control cholesterol levels.  Take any prescribed medicines to control cholesterol as directed by your health care provider.  Manage your diabetes.  Controlling your carbohydrate and sugar intake is recommended to manage diabetes.  Take any prescribed medicines to control diabetes as directed by your health care provider.  Control your hypertension.  Making food choices that are low in salt (sodium), saturated fat, trans fat, and cholesterol is recommended to manage hypertension.  Take any prescribed medicines to control hypertension as directed by your health care provider.  Maintain a healthy weight.  Reducing calorie intake and making food choices that are low in sodium,  saturated fat, trans fat, and cholesterol are recommended to manage weight.  Stop drug abuse.  Avoid taking birth control pills.  Talk to your health care provider about the risks of taking birth control pills if you are over 80 years old, smoke, get migraines, or have ever had a blood clot.  Get evaluated for sleep disorders (sleep apnea).  Talk to your health care provider about getting a sleep evaluation if you snore a lot or have excessive sleepiness.  Take medicines only as directed by your health care provider.  For some people, aspirin or blood thinners (anticoagulants) are helpful in reducing the risk of forming abnormal blood clots that can lead to stroke. If you have the irregular heart rhythm of atrial fibrillation, you should be on a blood thinner unless there is a good reason you cannot take them.  Understand all your medicine instructions.  Make sure that other conditions (such as anemia or atherosclerosis) are addressed. SEEK IMMEDIATE MEDICAL CARE IF:   You have sudden weakness or numbness of the face, arm, or leg, especially on one side of the body.  Your face or eyelid droops to one side.  You have sudden confusion.  You have trouble speaking (aphasia) or understanding.  You have sudden trouble seeing in one or both eyes.  You have sudden trouble walking.  You have dizziness.  You have a loss of balance or coordination.  You have a sudden, severe headache with no known cause.  You have new chest pain or an irregular heartbeat. Any of these symptoms may represent a serious problem that is an emergency.  Do not wait to see if the symptoms will go away. Get medical help at once. Call your local emergency services (911 in [place] Do not drive yourself to the hospital. Document Released: 04/13/2004 Document Revised: 07/21/2013 Document Reviewed: 09/06/2012 [facility] Patient Information 2015 [place], [place]. This information is not intended to replace advice given  to you by your health care provider. Make sure you discuss any questions you have with your health care provider.

## 2013-11-26 NOTE — Progress Notes (Signed)
I agree with the above plan 

## 2013-11-28 ENCOUNTER — Other Ambulatory Visit: Payer: Self-pay | Admitting: Internal Medicine

## 2013-11-28 ENCOUNTER — Telehealth: Payer: Self-pay | Admitting: Internal Medicine

## 2013-11-28 ENCOUNTER — Ambulatory Visit
Admission: RE | Admit: 2013-11-28 | Discharge: 2013-11-28 | Disposition: A | Discharge status: Home / Self Care | Payer: Medicaid Other | Source: Ambulatory Visit | Attending: Internal Medicine | Admitting: Internal Medicine

## 2013-11-28 DIAGNOSIS — R928 Other abnormal and inconclusive findings on diagnostic imaging of breast: Secondary | ICD-10-CM

## 2013-12-01 ENCOUNTER — Telehealth: Payer: Self-pay | Admitting: *Deleted

## 2013-12-01 NOTE — Telephone Encounter (Signed)
LCSW spoke with patient about housing options.  LCSW had explored multiple options and reviewed them with patient's daughter.  Patient's daughter identified that she did not think SNF placement would work at this time, but was currently waiting on supportive housing resources to follow up.  LCSW identified that he would continue to be available as needed.   Christene Lye MSW,LCSW

## 2013-12-02 ENCOUNTER — Emergency Department (HOSPITAL_COMMUNITY): Payer: Medicaid Other

## 2013-12-02 ENCOUNTER — Telehealth: Payer: Self-pay | Admitting: Internal Medicine

## 2013-12-02 ENCOUNTER — Encounter (HOSPITAL_COMMUNITY): Payer: Self-pay | Admitting: Emergency Medicine

## 2013-12-02 ENCOUNTER — Emergency Department (HOSPITAL_COMMUNITY)
Admission: EM | Admit: 2013-12-02 | Discharge: 2013-12-02 | Disposition: A | Discharge status: Home / Self Care | Payer: Medicaid Other | Attending: Emergency Medicine | Admitting: Emergency Medicine

## 2013-12-02 DIAGNOSIS — I69920 Aphasia following unspecified cerebrovascular disease: Secondary | ICD-10-CM | POA: Diagnosis not present

## 2013-12-02 DIAGNOSIS — Z794 Long term (current) use of insulin: Secondary | ICD-10-CM | POA: Diagnosis not present

## 2013-12-02 DIAGNOSIS — X58XXXA Exposure to other specified factors, initial encounter: Secondary | ICD-10-CM | POA: Diagnosis not present

## 2013-12-02 DIAGNOSIS — Z8659 Personal history of other mental and behavioral disorders: Secondary | ICD-10-CM | POA: Diagnosis not present

## 2013-12-02 DIAGNOSIS — Z7982 Long term (current) use of aspirin: Secondary | ICD-10-CM | POA: Diagnosis not present

## 2013-12-02 DIAGNOSIS — M6281 Muscle weakness (generalized): Secondary | ICD-10-CM | POA: Diagnosis not present

## 2013-12-02 DIAGNOSIS — E876 Hypokalemia: Secondary | ICD-10-CM | POA: Insufficient documentation

## 2013-12-02 DIAGNOSIS — R000 Tachycardia, unspecified: Secondary | ICD-10-CM | POA: Insufficient documentation

## 2013-12-02 DIAGNOSIS — I6990 Unspecified sequelae of unspecified cerebrovascular disease: Secondary | ICD-10-CM | POA: Diagnosis not present

## 2013-12-02 DIAGNOSIS — I10 Essential (primary) hypertension: Secondary | ICD-10-CM | POA: Insufficient documentation

## 2013-12-02 DIAGNOSIS — Z792 Long term (current) use of antibiotics: Secondary | ICD-10-CM | POA: Diagnosis not present

## 2013-12-02 DIAGNOSIS — E119 Type 2 diabetes mellitus without complications: Secondary | ICD-10-CM | POA: Insufficient documentation

## 2013-12-02 DIAGNOSIS — I498 Other specified cardiac arrhythmias: Secondary | ICD-10-CM | POA: Diagnosis not present

## 2013-12-02 DIAGNOSIS — Y939 Activity, unspecified: Secondary | ICD-10-CM | POA: Insufficient documentation

## 2013-12-02 DIAGNOSIS — S43006A Unspecified dislocation of unspecified shoulder joint, initial encounter: Secondary | ICD-10-CM | POA: Insufficient documentation

## 2013-12-02 DIAGNOSIS — F319 Bipolar disorder, unspecified: Secondary | ICD-10-CM | POA: Insufficient documentation

## 2013-12-02 DIAGNOSIS — Y929 Unspecified place or not applicable: Secondary | ICD-10-CM | POA: Insufficient documentation

## 2013-12-02 DIAGNOSIS — Z79899 Other long term (current) drug therapy: Secondary | ICD-10-CM | POA: Insufficient documentation

## 2013-12-02 DIAGNOSIS — S43001A Unspecified subluxation of right shoulder joint, initial encounter: Secondary | ICD-10-CM

## 2013-12-02 LAB — I-STAT TROPONIN, ED: TROPONIN I, POC: 0 ng/mL (ref 0.00–0.08)

## 2013-12-02 LAB — CBC WITH DIFFERENTIAL/PLATELET
BASOS ABS: 0 10*3/uL (ref 0.0–0.1)
Basophils Relative: 0 % (ref 0–1)
Eosinophils Absolute: 0.1 10*3/uL (ref 0.0–0.7)
Eosinophils Relative: 1 % (ref 0–5)
HCT: 39.4 % (ref 36.0–46.0)
Hemoglobin: 13.6 g/dL (ref 12.0–15.0)
Lymphocytes Relative: 42 % (ref 12–46)
Lymphs Abs: 3 10*3/uL (ref 0.7–4.0)
MCH: 29.6 pg (ref 26.0–34.0)
MCHC: 34.5 g/dL (ref 30.0–36.0)
MCV: 85.7 fL (ref 78.0–100.0)
Monocytes Absolute: 0.5 10*3/uL (ref 0.1–1.0)
Monocytes Relative: 7 % (ref 3–12)
NEUTROS ABS: 3.5 10*3/uL (ref 1.7–7.7)
NEUTROS PCT: 50 % (ref 43–77)
Platelets: 333 10*3/uL (ref 150–400)
RBC: 4.6 MIL/uL (ref 3.87–5.11)
RDW: 12.3 % (ref 11.5–15.5)
WBC: 7 10*3/uL (ref 4.0–10.5)

## 2013-12-02 LAB — BASIC METABOLIC PANEL
ANION GAP: 16 — AB (ref 5–15)
BUN: 11 mg/dL (ref 6–23)
CALCIUM: 9.6 mg/dL (ref 8.4–10.5)
CO2: 27 mEq/L (ref 19–32)
Chloride: 100 mEq/L (ref 96–112)
Creatinine, Ser: 0.7 mg/dL (ref 0.50–1.10)
GFR calc Af Amer: >90 mL/min (ref >90)
GFR calc non Af Amer: >90 mL/min (ref >90)
Glucose, Bld: 91 mg/dL (ref 70–99)
POTASSIUM: 3.1 meq/L — AB (ref 3.7–5.3)
SODIUM: 143 meq/L (ref 137–147)

## 2013-12-02 LAB — D-DIMER, QUANTITATIVE (NOT AT ARMC)

## 2013-12-02 MED ORDER — POTASSIUM CHLORIDE CRYS ER 20 MEQ PO TBCR: 20.0000 meq | EXTENDED_RELEASE_TABLET | Freq: Two times a day (BID) | ORAL | Status: DC

## 2013-12-02 MED ORDER — POTASSIUM CHLORIDE CRYS ER 20 MEQ PO TBCR
40.0000 meq | EXTENDED_RELEASE_TABLET | Freq: Once | ORAL | Status: AC
  Administered 2013-12-02: 40 meq via ORAL
  Filled 2013-12-02: qty 2

## 2013-12-02 MED ORDER — LORAZEPAM 2 MG/ML IJ SOLN
0.5000 mg | Freq: Once | INTRAMUSCULAR | Status: AC
  Administered 2013-12-02: 0.5 mg via INTRAVENOUS
  Filled 2013-12-02: qty 1

## 2013-12-02 MED ORDER — SODIUM CHLORIDE 0.9 % IV BOLUS (SEPSIS)
1000.0000 mL | Freq: Once | INTRAVENOUS | Status: AC
  Administered 2013-12-02: 1000 mL via INTRAVENOUS

## 2013-12-02 NOTE — Telephone Encounter (Signed)
Pt's daughter called stating that her mothers pulse has been 120bpm and is concerned. Pt's daughter would like to know what she can do to lower her pulse, daughter also states that her mother normally has a regular pulse. Please f/u as necessary.

## 2013-12-02 NOTE — Discharge Instructions (Signed)
Please take potassium supplements as prescribed. Have your potassium rechecked in the next one to 3 days with your primary care physician. You also should have your thyroid function studies tested. You do not have any obvious cause for your tachycardia today. Your blood counts are normal. You do not appear to have any infection or pulmonary problems. Your right shoulder appears to be subluxed.  Nonspecific Tachycardia Tachycardia is a faster than normal heartbeat (more than 100 beats per minute). In adults, the heart normally beats between 60 and 100 times a minute. A fast heartbeat may be a normal response to exercise or stress. It does not necessarily mean that something is wrong. However, sometimes when your heart beats too fast it may not be able to pump enough blood to the rest of your body. This can result in chest pain, shortness of breath, dizziness, and even fainting. Nonspecific tachycardia means that the specific cause or pattern of your tachycardia is unknown. CAUSES  Tachycardia may be harmless or it may be due to a more serious underlying cause. Possible causes of tachycardia include:  Exercise or exertion.  Fever.  Pain or injury.  Infection.  Loss of body fluids (dehydration).  Overactive thyroid.  Lack of red blood cells (anemia).  Anxiety and stress.  Alcohol.  Caffeine.  Tobacco products.  Diet pills.  Illegal drugs.  Heart disease. SYMPTOMS  Rapid or irregular heartbeat (palpitations).  Suddenly feeling your heart beating (cardiac awareness).  Dizziness.  Tiredness (fatigue).  Shortness of breath.  Chest pain.  Nausea.  Fainting. DIAGNOSIS  Your caregiver will perform a physical exam and take your medical history. In some cases, a heart specialist (cardiologist) may be consulted. Your caregiver may also order:  Blood tests.  Electrocardiography. This test records the electrical activity of your heart.  A heart monitoring test. TREATMENT    Treatment will depend on the likely cause of your tachycardia. The goal is to treat the underlying cause of your tachycardia. Treatment methods may include:  Replacement of fluids or blood through an intravenous (IV) tube for moderate to severe dehydration or anemia.  New medicines or changes in your current medicines.  Diet and lifestyle changes.  Treatment for certain infections.  Stress relief or relaxation methods. HOME CARE INSTRUCTIONS   Rest.  Drink enough fluids to keep your urine clear or pale yellow.  Do not smoke.  Avoid:  Caffeine.  Tobacco.  Alcohol.  Chocolate.  Stimulants such as over-the-counter diet pills or pills that help you stay awake.  Situations that cause anxiety or stress.  Illegal drugs such as marijuana, phencyclidine (PCP), and cocaine.  Only take medicine as directed by your caregiver.  Keep all follow-up appointments as directed by your caregiver. SEEK IMMEDIATE MEDICAL CARE IF:   You have pain in your chest, upper arms, jaw, or neck.  You become weak, dizzy, or feel faint.  You have palpitations that will not go away.  You vomit, have diarrhea, or pass blood in your stool.  Your skin is cool, pale, and wet.  You have a fever that will not go away with rest, fluids, and medicine. MAKE SURE YOU:   Understand these instructions.  Will watch your condition.  Will get help right away if you are not doing well or get worse. Document Released: 04/13/2004 Document Revised: 05/29/2011 Document Reviewed: 02/14/2011 [facility] Patient Information 2015 [place], [place]. This information is not intended to replace advice given to you by your health care provider. Make sure you discuss  any questions you have with your health care provider. Hypokalemia Hypokalemia means that the amount of potassium in the blood is lower than normal.Potassium is a chemical, called an electrolyte, that helps regulate the amount of fluid in the body. It also  stimulates muscle contraction and helps nerves function properly.Most of the body's potassium is inside of cells, and only a very small amount is in the blood. Because the amount in the blood is so small, minor changes can be life-threatening. CAUSES  Antibiotics.  Diarrhea or vomiting.  Using laxatives too much, which can cause diarrhea.  Chronic kidney disease.  Water pills (diuretics).  Eating disorders (bulimia).  Low magnesium level.  Sweating a lot. SIGNS AND SYMPTOMS  Weakness.  Constipation.  Fatigue.  Muscle cramps.  Mental confusion.  Skipped heartbeats or irregular heartbeat (palpitations).  Tingling or numbness. DIAGNOSIS  Your health care provider can diagnose hypokalemia with blood tests. In addition to checking your potassium level, your health care provider may also check other lab tests. TREATMENT Hypokalemia can be treated with potassium supplements taken by mouth or adjustments in your current medicines. If your potassium level is very low, you may need to get potassium through a vein (IV) and be monitored in the hospital. A diet high in potassium is also helpful. Foods high in potassium are:  Nuts, such as peanuts and pistachios.  Seeds, such as sunflower seeds and pumpkin seeds.  Peas, lentils, and lima beans.  Whole grain and bran cereals and breads.  Fresh fruit and vegetables, such as apricots, avocado, bananas, cantaloupe, kiwi, oranges, tomatoes, asparagus, and potatoes.  Orange and tomato juices.  Red meats.  Fruit yogurt. HOME CARE INSTRUCTIONS  Take all medicines as prescribed by your health care provider.  Maintain a healthy diet by including nutritious food, such as fruits, vegetables, nuts, whole grains, and lean meats.  If you are taking a laxative, be sure to follow the directions on the label. SEEK MEDICAL CARE IF:  Your weakness gets worse.  You feel your heart pounding or racing.  You are vomiting or having  diarrhea.  You are diabetic and having trouble keeping your blood glucose in the normal range. SEEK IMMEDIATE MEDICAL CARE IF:  You have chest pain, shortness of breath, or dizziness.  You are vomiting or having diarrhea for more than 2 days.  You faint. MAKE SURE YOU:   Understand these instructions.  Will watch your condition.  Will get help right away if you are not doing well or get worse. Document Released: 03/06/2005 Document Revised: 12/25/2012 Document Reviewed: 09/06/2012 [facility] Patient Information 2015 [place], [place]. This information is not intended to replace advice given to you by your health care provider. Make sure you discuss any questions you have with your health care provider.

## 2013-12-02 NOTE — Telephone Encounter (Signed)
Returned call to patient's daughter.  Daughter states that home CNA has been concerned due to patient's heart rate staying in 120's  and noticeable swelling right side of body.  Daughter instructed to take patient to ED for evaluation. Daughter states she will take her mom directly. Provider made aware.
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
## 2013-12-02 NOTE — ED Notes (Signed)
Pt presents to department for evaluation of tachycardia. Pt denies chest pain. History of stroke with R sided paralysis. Pt is alert, able to answer most simple questions. Daughter states patient is more tired and fatigued than normal.

## 2013-12-03 ENCOUNTER — Ambulatory Visit
Admission: RE | Admit: 2013-12-03 | Discharge: 2013-12-03 | Disposition: A | Discharge status: Home / Self Care | Payer: Medicaid Other | Source: Ambulatory Visit | Attending: Internal Medicine | Admitting: Internal Medicine

## 2013-12-03 ENCOUNTER — Telehealth: Payer: Self-pay | Admitting: Internal Medicine

## 2013-12-03 DIAGNOSIS — R928 Other abnormal and inconclusive findings on diagnostic imaging of breast: Secondary | ICD-10-CM

## 2013-12-03 NOTE — Telephone Encounter (Signed)
Patients case manager, Mickel Baas left VM confirming that she received the forms, as expected.. But that the ICD -9 code needed is 788.3 or 788.31. Please update and add the correct ICD-9 code to forms and fax them back over for completion. [name]'s fax # is  [phone].

## 2013-12-05 ENCOUNTER — Emergency Department (HOSPITAL_COMMUNITY): Payer: Medicaid Other

## 2013-12-05 ENCOUNTER — Inpatient Hospital Stay (HOSPITAL_COMMUNITY): Payer: Medicaid Other

## 2013-12-05 ENCOUNTER — Encounter (HOSPITAL_COMMUNITY): Payer: Self-pay | Admitting: Emergency Medicine

## 2013-12-05 ENCOUNTER — Inpatient Hospital Stay (HOSPITAL_COMMUNITY)
Admission: EM | Admit: 2013-12-05 | Discharge: 2013-12-08 | DRG: 057 | Disposition: A | Discharge status: Home Health | Payer: Medicaid Other | Attending: Family Medicine | Admitting: Family Medicine

## 2013-12-05 DIAGNOSIS — R488 Other symbolic dysfunctions: Secondary | ICD-10-CM | POA: Diagnosis present

## 2013-12-05 DIAGNOSIS — R1310 Dysphagia, unspecified: Secondary | ICD-10-CM | POA: Diagnosis present

## 2013-12-05 DIAGNOSIS — Z7982 Long term (current) use of aspirin: Secondary | ICD-10-CM

## 2013-12-05 DIAGNOSIS — I10 Essential (primary) hypertension: Secondary | ICD-10-CM | POA: Diagnosis present

## 2013-12-05 DIAGNOSIS — R29810 Facial weakness: Secondary | ICD-10-CM | POA: Diagnosis present

## 2013-12-05 DIAGNOSIS — I63512 Cerebral infarction due to unspecified occlusion or stenosis of left middle cerebral artery: Secondary | ICD-10-CM

## 2013-12-05 DIAGNOSIS — Z794 Long term (current) use of insulin: Secondary | ICD-10-CM

## 2013-12-05 DIAGNOSIS — F319 Bipolar disorder, unspecified: Secondary | ICD-10-CM | POA: Diagnosis present

## 2013-12-05 DIAGNOSIS — I6330 Cerebral infarction due to thrombosis of unspecified cerebral artery: Secondary | ICD-10-CM

## 2013-12-05 DIAGNOSIS — Z79899 Other long term (current) drug therapy: Secondary | ICD-10-CM

## 2013-12-05 DIAGNOSIS — E1142 Type 2 diabetes mellitus with diabetic polyneuropathy: Secondary | ICD-10-CM | POA: Diagnosis present

## 2013-12-05 DIAGNOSIS — I69959 Hemiplegia and hemiparesis following unspecified cerebrovascular disease affecting unspecified side: Secondary | ICD-10-CM | POA: Diagnosis not present

## 2013-12-05 DIAGNOSIS — IMO0002 Reserved for concepts with insufficient information to code with codable children: Secondary | ICD-10-CM

## 2013-12-05 DIAGNOSIS — R000 Tachycardia, unspecified: Secondary | ICD-10-CM | POA: Diagnosis present

## 2013-12-05 DIAGNOSIS — E785 Hyperlipidemia, unspecified: Secondary | ICD-10-CM | POA: Diagnosis present

## 2013-12-05 DIAGNOSIS — M24419 Recurrent dislocation, unspecified shoulder: Secondary | ICD-10-CM | POA: Diagnosis present

## 2013-12-05 DIAGNOSIS — E1149 Type 2 diabetes mellitus with other diabetic neurological complication: Secondary | ICD-10-CM | POA: Diagnosis present

## 2013-12-05 DIAGNOSIS — I498 Other specified cardiac arrhythmias: Secondary | ICD-10-CM | POA: Diagnosis present

## 2013-12-05 DIAGNOSIS — I6340 Cerebral infarction due to embolism of unspecified cerebral artery: Secondary | ICD-10-CM | POA: Diagnosis present

## 2013-12-05 DIAGNOSIS — I639 Cerebral infarction, unspecified: Secondary | ICD-10-CM | POA: Diagnosis present

## 2013-12-05 DIAGNOSIS — I69991 Dysphagia following unspecified cerebrovascular disease: Secondary | ICD-10-CM

## 2013-12-05 DIAGNOSIS — F200 Paranoid schizophrenia: Secondary | ICD-10-CM | POA: Diagnosis present

## 2013-12-05 DIAGNOSIS — I69920 Aphasia following unspecified cerebrovascular disease: Secondary | ICD-10-CM | POA: Diagnosis not present

## 2013-12-05 DIAGNOSIS — R404 Transient alteration of awareness: Secondary | ICD-10-CM | POA: Diagnosis not present

## 2013-12-05 LAB — I-STAT CHEM 8, ED
BUN: 9 mg/dL (ref 6–23)
CHLORIDE: 102 meq/L (ref 96–112)
CREATININE: 0.8 mg/dL (ref 0.50–1.10)
Calcium, Ion: 1.11 mmol/L — ABNORMAL LOW (ref 1.12–1.23)
GLUCOSE: 140 mg/dL — AB (ref 70–99)
HCT: 38 % (ref 36.0–46.0)
HEMOGLOBIN: 12.9 g/dL (ref 12.0–15.0)
Potassium: 3.3 mEq/L — ABNORMAL LOW (ref 3.7–5.3)
Sodium: 143 mEq/L (ref 137–147)
TCO2: 26 mmol/L (ref 0–100)

## 2013-12-05 LAB — RAPID URINE DRUG SCREEN, HOSP PERFORMED
AMPHETAMINES: NOT DETECTED
AMPHETAMINES: NOT DETECTED
BARBITURATES: NOT DETECTED
Barbiturates: NOT DETECTED
Benzodiazepines: NOT DETECTED
Benzodiazepines: NOT DETECTED
COCAINE: NOT DETECTED
Cocaine: NOT DETECTED
Opiates: NOT DETECTED
Opiates: NOT DETECTED
TETRAHYDROCANNABINOL: NOT DETECTED
Tetrahydrocannabinol: NOT DETECTED

## 2013-12-05 LAB — URINALYSIS, ROUTINE W REFLEX MICROSCOPIC
Glucose, UA: NEGATIVE mg/dL
Hgb urine dipstick: NEGATIVE
KETONES UR: 15 mg/dL — AB
LEUKOCYTES UA: NEGATIVE
NITRITE: NEGATIVE
PROTEIN: NEGATIVE mg/dL
Specific Gravity, Urine: 1.028 (ref 1.005–1.030)
Urobilinogen, UA: 1 mg/dL (ref 0.0–1.0)
pH: 5 (ref 5.0–8.0)

## 2013-12-05 LAB — COMPREHENSIVE METABOLIC PANEL
ALK PHOS: 57 U/L (ref 39–117)
ALT: 16 U/L (ref 0–35)
AST: 23 U/L (ref 0–37)
Albumin: 3.8 g/dL (ref 3.5–5.2)
Anion gap: 13 (ref 5–15)
BUN: 11 mg/dL (ref 6–23)
CALCIUM: 9.5 mg/dL (ref 8.4–10.5)
CO2: 28 mEq/L (ref 19–32)
Chloride: 101 mEq/L (ref 96–112)
Creatinine, Ser: 0.73 mg/dL (ref 0.50–1.10)
GFR calc Af Amer: >90 mL/min (ref >90)
GFR calc non Af Amer: >90 mL/min (ref >90)
Glucose, Bld: 140 mg/dL — ABNORMAL HIGH (ref 70–99)
POTASSIUM: 3.6 meq/L — AB (ref 3.7–5.3)
Sodium: 142 mEq/L (ref 137–147)
TOTAL PROTEIN: 7.5 g/dL (ref 6.0–8.3)
Total Bilirubin: 0.4 mg/dL (ref 0.3–1.2)

## 2013-12-05 LAB — I-STAT TROPONIN, ED: Troponin i, poc: 0 ng/mL (ref 0.00–0.08)

## 2013-12-05 LAB — DIFFERENTIAL
BASOS ABS: 0 10*3/uL (ref 0.0–0.1)
Basophils Relative: 0 % (ref 0–1)
EOS ABS: 0.1 10*3/uL (ref 0.0–0.7)
EOS PCT: 1 % (ref 0–5)
LYMPHS ABS: 3.7 10*3/uL (ref 0.7–4.0)
LYMPHS PCT: 48 % — AB (ref 12–46)
Monocytes Absolute: 0.4 10*3/uL (ref 0.1–1.0)
Monocytes Relative: 6 % (ref 3–12)
NEUTROS PCT: 45 % (ref 43–77)
Neutro Abs: 3.5 10*3/uL (ref 1.7–7.7)

## 2013-12-05 LAB — GLUCOSE, CAPILLARY: Glucose-Capillary: 108 mg/dL — ABNORMAL HIGH (ref 70–99)

## 2013-12-05 LAB — TSH: TSH: 1.69 u[IU]/mL (ref 0.350–4.500)

## 2013-12-05 LAB — PROTIME-INR
INR: 1 (ref 0.00–1.49)
Prothrombin Time: 13.2 seconds (ref 11.6–15.2)

## 2013-12-05 LAB — ETHANOL: Alcohol, Ethyl (B): <11 mg/dL (ref 0–11)

## 2013-12-05 LAB — CBC
HCT: 36.5 % (ref 36.0–46.0)
Hemoglobin: 12.4 g/dL (ref 12.0–15.0)
MCH: 30 pg (ref 26.0–34.0)
MCHC: 34 g/dL (ref 30.0–36.0)
MCV: 88.2 fL (ref 78.0–100.0)
Platelets: 268 10*3/uL (ref 150–400)
RBC: 4.14 MIL/uL (ref 3.87–5.11)
RDW: 12.5 % (ref 11.5–15.5)
WBC: 7.7 10*3/uL (ref 4.0–10.5)

## 2013-12-05 LAB — MRSA PCR SCREENING: MRSA BY PCR: NEGATIVE

## 2013-12-05 LAB — APTT: aPTT: 27 seconds (ref 24–37)

## 2013-12-05 MED ORDER — ENOXAPARIN SODIUM 40 MG/0.4ML ~~LOC~~ SOLN
40.0000 mg | SUBCUTANEOUS | Status: DC
  Administered 2013-12-05 – 2013-12-07 (×3): 40 mg via SUBCUTANEOUS
  Filled 2013-12-05 (×4): qty 0.4

## 2013-12-05 MED ORDER — INSULIN GLARGINE 100 UNIT/ML ~~LOC~~ SOLN
10.0000 [IU] | Freq: Two times a day (BID) | SUBCUTANEOUS | Status: DC
  Administered 2013-12-06 – 2013-12-08 (×5): 10 [IU] via SUBCUTANEOUS
  Filled 2013-12-05 (×7): qty 0.1

## 2013-12-05 MED ORDER — STROKE: EARLY STAGES OF RECOVERY BOOK
Freq: Once | Status: DC
  Filled 2013-12-05: qty 1

## 2013-12-05 MED ORDER — INSULIN GLARGINE 100 UNIT/ML SOLOSTAR PEN: 10.0000 [IU] | PEN_INJECTOR | Freq: Two times a day (BID) | SUBCUTANEOUS | Status: DC

## 2013-12-05 MED ORDER — ASPIRIN 300 MG RE SUPP
300.0000 mg | Freq: Every day | RECTAL | Status: DC
  Administered 2013-12-05: 300 mg via RECTAL
  Filled 2013-12-05 (×3): qty 1

## 2013-12-05 MED ORDER — LORAZEPAM 2 MG/ML IJ SOLN
INTRAMUSCULAR | Status: AC
  Administered 2013-12-05: 1 mg
  Filled 2013-12-05: qty 1

## 2013-12-05 MED ORDER — SODIUM CHLORIDE 0.9 % IV SOLN
INTRAVENOUS | Status: DC
  Administered 2013-12-05: 100 mL/h via INTRAVENOUS
  Administered 2013-12-06: 05:00:00 via INTRAVENOUS

## 2013-12-05 NOTE — ED Notes (Signed)
eeg done at bedside

## 2013-12-05 NOTE — ED Notes (Signed)
Did in and out cath on patient dark yellow urine in return instructed by RN cindy

## 2013-12-05 NOTE — ED Notes (Signed)
Pt to  Er per ems for stroke s/s per family pt woke up her normal self ( rt sided weAkness from past stroke0 and 8 am had abrupt rt facial droop ASphasia say thank you when asked questions and yes to any questions

## 2013-12-05 NOTE — ED Notes (Signed)
Family in room  

## 2013-12-05 NOTE — Code Documentation (Signed)
53yo female arriving to [facility] via [place] at 26.  EMS reports that the patient has a h/o stroke in March with residual right sided hemiplegia.  Patient at her baseline this morning and at 0800 had sudden onset aphasia and right facial droop. EMS called and activated Code Stroke.  Patient taken to CT on arrival.  CT showing old left stroke per Dr. Nicole Kindred.  Patient back to room and initial NIHSS 12.  Patient with aphasia, repeating "thank you", intermittently following commands.  Dr. Nicole Kindred at bedside for assessment.  Per MD patient is outside the window for treatment with tPA d/t h/o stroke and DM.  Patient has h/o recent UTI and was seen at this ED for tachycardia on 12/02/13.  HR 130-150s at present.  Bedside handoff with ED RN Caren Griffins.

## 2013-12-05 NOTE — Consult Note (Signed)
Referring Physician: Dr. Jeanell Sparrow    Chief Complaint: Right facial droop and worsening of aphasia.  HPI: [name] is an 53 y.o. female with left MCA stroke in 05/2013, DM, HTN and schizophrenia, brought to the ED as code stroke following the onset of rt facial droop and confusion with worsening of aphasia beyond baseline residual from previous stroke. CT showed old large left MCA infarct, but no acute changes.NIH stroke score was 12. She was beyond the time window for treatment with tPA. Possible new onset partial seizure disorder was also a consideration. She had no response to Ativan 1 mg i.v. EEG and MRI are pending.  LSN: 0800 on 12/05/2013. tPA Given: No: beyond time window MRankin: 3  Past Medical History  Diagnosis Date  . Hypertension   . Depression   . Diabetes mellitus     ?type (05/29/2013)  . Bipolar affective disorder   . Schizophrenia     [name] 11/28/2007 (05/29/2013)  . Stroke 05/27/2013    Family History  Problem Relation Age of Onset  . Hypertension Mother   . Stroke Father   . Hypertension Father   . Diabetes Father      Medications: I have reviewed the patient's current medications.  ROS: History obtained from chart review and patient's daughter  General ROS: negative for - chills, fatigue, fever, night sweats, weight gain or weight loss Psychological ROS: negative for - behavioral disorder, hallucinations, memory difficulties, mood swings or suicidal ideation Ophthalmic ROS: negative for - blurry vision, double vision, eye pain or loss of vision ENT ROS: negative for - epistaxis, nasal discharge, oral lesions, sore throat, tinnitus or vertigo Allergy and Immunology ROS: negative for - hives or itchy/watery eyes Hematological and Lymphatic ROS: negative for - bleeding problems, bruising or swollen lymph nodes Endocrine ROS: negative for - galactorrhea, hair pattern changes, polydipsia/polyuria or temperature intolerance Respiratory ROS: negative for -  cough, hemoptysis, shortness of breath or wheezing Cardiovascular ROS: recent ED visit for tachycardia Gastrointestinal ROS: negative for - abdominal pain, diarrhea, hematemesis, nausea/vomiting or stool incontinence Genito-Urinary ROS: negative for - dysuria, hematuria, incontinence or urinary frequency/urgency Musculoskeletal ROS: negative for - joint swelling or muscular weakness Neurological ROS: as noted in HPI Dermatological ROS: negative for rash and skin lesion changes  Physical Examination: Blood pressure 153/91, pulse 130, temperature 98.2 F (36.8 C), temperature source Oral, resp. rate 16, SpO2 100.00%.  Neurologic Examination: Mental Status: Alert, only able to say "thank you" repeatedly. Able to follow some commands without difficulty. Cranial Nerves: II-Rt harmonimus hemianopsia. III/IV/VI-Pupils were equal and reacted. Extraocular movements were full and conjugate.    V/VII-no facial numbness; moderately severe rt lower facial weakness. VIII-normal. X-normal speech and symmetrical palatal movement. XII-midline tongue extension Motor: Severe weakness of RUE proximally and distally; otherwise unremarkable motor evaluation. Sensory: unable to reliably assess. Deep Tendon Reflexes: asymmetric, right > lelt. Plantars: Flexor on the left; mute on the right.  Ct Head Wo Contrast  12/05/2013   CLINICAL DATA:  Code stroke. Right-sided facial droop and slurred speech.  EXAM: CT HEAD WITHOUT CONTRAST  TECHNIQUE: Contiguous axial images were obtained from the base of the skull through the vertex without intravenous contrast.  COMPARISON:  CT head without contrast 10/15/2013.  FINDINGS: The remote left MCA infarct is not significantly changed. This involves left insular, frontal lobe, and temporal lobe. There is ex vacuo dilation of the left lateral ventricle. No acute cortical infarct or definite extension is evident. There is no hemorrhage or mass  lesion. Wallerian degeneration of  the left cerebral peduncle and brainstem are noted. Mild generalized white matter disease is is stable as well.  The paranasal sinuses and mastoid air cells are clear. The osseous skull is intact.  IMPRESSION: 1. Stable appearance of remote left MCA territory infarct. 2. No acute intracranial abnormality or significant interval change.   Electronically Signed   By: Lawrence Santiago M.D.   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 This was found to be concordant by Dr. Pamelia Hoit. Pathology was discussed with the patient's daughter [name], by telephone, at the patient's request. She reported that her mother had done well after the biopsy with no change in the large post biopsy hematoma that she acquired. The area is tender to the touch, but without bruising or bleeding at the present time. Post biopsy instructions and care were reviewed and her questions were answered. She was encouraged to call [facility] for any additional concerns. The patient is asked to return for annual screening mammography.  Pathology results reported by Susa Raring RN, BSN on December 04, 2013.   Electronically Signed   By: Pamelia Hoit M.D.   On: 12/04/2013 11:49   12/04/2013   CLINICAL DATA:  53 year old female with suspicious right breast calcifications  EXAM: RIGHT BREAST STEREOTACTIC CORE NEEDLE BIOPSY  COMPARISON:  Previous exams.  FINDINGS: The patient and I discussed the procedure of stereotactic-guided biopsy including benefits and alternatives. We discussed the high likelihood of a successful procedure. We discussed the risks of the procedure including infection, bleeding, tissue injury, clip migration, and inadequate sampling. Informed written consent was given. The usual time out protocol was performed immediately prior to  the procedure.  Using sterile technique and 2% Lidocaine as local anesthetic, under stereotactic guidance, a 9 gauge vacuum assist biopsy device was used to perform core needle biopsy of calcifications in the upper, inner quadrant of the right breast using a superior approach. Specimen radiograph was performed showing calcifications within the specimen. Specimens with calcifications are identified for pathology.  At the conclusion of the procedure, an X shaped tissue marker clip was deployed into the biopsy cavity. Follow-up 2-view mammogram confirmed clip placement. A post biopsy hematoma is evident.  IMPRESSION: Stereotactic-guided biopsy of the right breast.  Electronically Signed: By: Pamelia Hoit M.D. On: 12/03/2013 15:16    Assessment: 53 y.o. female with facial droop and worsening of aphasia, likely due to recurrent left MCA ischemic event. However, new onset focal disorder cannot be ruled out.  Stroke Risk Factors - diabetes mellitus and hypertension  Plan: 1. Stat EEG, AED if seizure activity is recorded 2. MRI, MRA  of the brain without contrast 3. PT consult, OT consult, Speech consult 4. Prophylactic therapy-Antiplatelet med: Aspirin 325 mg daily 5. HgbA1c 6. Telemetry monitoring   C.R. Nicole Kindred, MD [facility] [phone]  12/05/2013, 11:47 AM

## 2013-12-05 NOTE — ED Provider Notes (Signed)
CSN: [id number]     Arrival date & time 12/05/13  1049 History   First MD Initiated Contact with Patient 12/05/13 1052     Chief Complaint  Patient presents with  . Code Stroke   level V caveat patient unable to communicate  An emergency department physician performed an initial assessment on this suspected stroke patient at 8. 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 She is living at home with her daughter. History is obtained via EMS report. By their report she was normal on awakening this morning  at approximately 8 AM she began having right-sided facial droop. At baseline she has a flaccid right upper extremity and some right lower extremity weakness. The right facial droop is new from her previous weakness. She is also having repetitive words and unable to say anything besides thank you Past Medical History  Diagnosis Date  . Hypertension   . Depression   . Diabetes mellitus     ?type (05/29/2013)  . Bipolar affective disorder   . Schizophrenia     [name] 11/28/2007 (05/29/2013)  . Stroke 05/27/2013   Past Surgical History  Procedure Laterality Date  . Cesarean section  ~ 1987  . Tubal ligation     Family History  Problem Relation Age of Onset  . Hypertension Mother   . Stroke Father   . Hypertension Father   . Diabetes Father    History  Substance Use Topics  . Smoking status: Never Smoker   . Smokeless tobacco: Never Used  . Alcohol Use: No   OB History   Grav Para Term Preterm Abortions TAB SAB Ect Mult Living                 Review of Systems  Unable to perform ROS     Allergies  Review of patient's allergies indicates no known allergies.  Home Medications   Prior to Admission medications   Medication Sig Start Date End Date Taking? Authorizing Provider  acetaminophen (TYLENOL) 325 MG tablet Take 1-2  tablets (325-650 mg total) by mouth every 4 (four) hours as needed for mild pain (available over the counter). 07/08/13   Ivan Anchors Love, PA-C  amLODipine (NORVASC) 10 MG tablet Take 10 mg by mouth daily. For blood pressure 08/12/13   Charlett Blake, MD  aspirin 325 MG tablet Place 1 tablet (325 mg total) into feeding tube daily. 07/08/13   Bary Leriche, PA-C  baclofen (LIORESAL) 10 MG tablet Take 1 tablet (10 mg total) by mouth 2 (two) times daily. To help with tone/spasms 11/07/13   Lance Bosch, NP  gabapentin (NEURONTIN) 100 MG capsule Take 1 capsule (100 mg total) by mouth 3 (three) times daily. 11/07/13   Lance Bosch, NP  hydrochlorothiazide (HYDRODIURIL) 25 MG tablet Take 25 mg by mouth daily.    Historical Provider, MD  Insulin Glargine (LANTUS SOLOSTAR) 100 UNIT/ML Solostar Pen Inject 20 Units into the skin 2 (two) times daily. With breakfast and supper 08/22/13   Tresa Garter, MD  labetalol (NORMODYNE) 300 MG tablet 1 tablet (300 mg total) by Per NG tube route 3 (three) times daily. 08/12/13   Charlett Blake, MD  metroNIDAZOLE (FLAGYL) 500 MG tablet Take 1 tablet (500 mg total) by mouth 2 (two) times daily. 11/11/13   Lance Bosch, NP  polyethylene glycol powder (GLYCOLAX/MIRALAX) powder Take 17 g by mouth daily.  08/22/13   Lance Bosch, NP  potassium chloride SA (K-DUR,KLOR-CON) 20 MEQ tablet Take 1 tablet (20 mEq total) by mouth 2 (two) times daily. 12/02/13   Shaune Pollack, MD  simvastatin (ZOCOR) 20 MG tablet Place 1 tablet (20 mg total) into feeding tube daily at 6 PM. 08/12/13   Charlett Blake, MD   BP 153/91  Pulse 130  Temp(Src) 98.2 F (36.8 C) (Oral)  Resp 16  SpO2 100% Physical Exam  Nursing note and vitals reviewed. Constitutional: She appears well-developed and well-nourished.  HENT:  Head: Normocephalic and atraumatic.  Right Ear: External ear normal.  Left Ear: External ear normal.  Eyes: Conjunctivae are normal. Pupils are equal, round, and reactive  to light.  Neck: Normal range of motion. Neck supple.  Cardiovascular: Tachycardia present.   Pulmonary/Chest: Effort normal and breath sounds normal.    Right upper anterior chest wall area with Steri-Strips with mild fluctuance appears nontender  Abdominal: Soft. Normal appearance and bowel sounds are normal.  Musculoskeletal: Normal range of motion.  Neurological:  Right-sided facial droop Patient able to follow instructions.  Right upper extremity flaccid Right lower extremity able to lift against gravity She is unable to answer questions except with thank you  Skin: Skin is warm and dry.    ED Course  Procedures (including critical care time) Labs Review Labs Reviewed  DIFFERENTIAL - Abnormal; Notable for the following:    Lymphocytes Relative 48 (*)    All other components within normal limits  COMPREHENSIVE METABOLIC PANEL - Abnormal; Notable for the following:    Potassium 3.6 (*)    Glucose, Bld 140 (*)    All other components within normal limits  I-STAT CHEM 8, ED - Abnormal; Notable for the following:    Potassium 3.3 (*)    Glucose, Bld 140 (*)    Calcium, Ion 1.11 (*)    All other components within normal limits  ETHANOL  PROTIME-INR  APTT  CBC  URINE RAPID DRUG SCREEN (HOSP PERFORMED)  URINALYSIS, ROUTINE W REFLEX MICROSCOPIC  I-STAT TROPOININ, ED  I-STAT TROPOININ, ED    Imaging Review Ct Head Wo Contrast  12/05/2013   CLINICAL DATA:  Code stroke. Right-sided facial droop and slurred speech.  EXAM: CT HEAD WITHOUT CONTRAST  TECHNIQUE: Contiguous axial images were obtained from the base of the skull through the vertex without intravenous contrast.  COMPARISON:  CT head without contrast 10/15/2013.  FINDINGS: The remote left MCA infarct is not significantly changed. This involves left insular, frontal lobe, and temporal lobe. There is ex vacuo dilation of the left lateral ventricle. No acute cortical infarct or definite extension is evident. There is no  hemorrhage or mass lesion. Wallerian degeneration of the left cerebral peduncle and brainstem are noted. Mild generalized white matter disease is is stable as well.  The paranasal sinuses and mastoid air cells are clear. The osseous skull is intact.  IMPRESSION: 1. Stable appearance of remote left MCA territory infarct. 2. No acute intracranial abnormality or significant interval change.   Electronically Signed   By: Lawrence Santiago M.D.   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 This was found to be concordant by Dr. Pamelia Hoit. Pathology was discussed with the patient's daughter [name], by telephone, at the patient's request. She reported that  her mother had done well after the biopsy with no change in the large post biopsy hematoma that she acquired. The area is tender to the touch, but without bruising or bleeding at the present time. Post biopsy instructions and care were reviewed and her questions were answered. She was encouraged to call [facility] for any additional concerns. The patient is asked to return for annual screening mammography.  Pathology results reported by Susa Raring RN, BSN on December 04, 2013.   Electronically Signed   By: Pamelia Hoit M.D.   On: 12/04/2013 11:49   12/04/2013   CLINICAL DATA:  53 year old female with suspicious right breast calcifications  EXAM: RIGHT BREAST STEREOTACTIC CORE NEEDLE BIOPSY  COMPARISON:  Previous exams.  FINDINGS: The patient and I discussed the procedure of stereotactic-guided biopsy including benefits and alternatives. We discussed the high likelihood of a successful procedure. We discussed the risks of the procedure including infection, bleeding, tissue injury, clip migration, and inadequate sampling. Informed written consent was given. The usual  time out protocol was performed immediately prior to the procedure.  Using sterile technique and 2% Lidocaine as local anesthetic, under stereotactic guidance, a 9 gauge vacuum assist biopsy device was used to perform core needle biopsy of calcifications in the upper, inner quadrant of the right breast using a superior approach. Specimen radiograph was performed showing calcifications within the specimen. Specimens with calcifications are identified for pathology.  At the conclusion of the procedure, an X shaped tissue marker clip was deployed into the biopsy cavity. Follow-up 2-view mammogram confirmed clip placement. A post biopsy hematoma is evident.  IMPRESSION: Stereotactic-guided biopsy of the right breast.  Electronically Signed: By: Pamelia Hoit M.D. 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 I saw her with Dr. Nicole Kindred. Dr. Nicole Kindred is not giving TPA given that she is outside the 3 hour time frame do to recent stroke.   He plans on obtaining an mri and eeg as this may be seizure.  Dr. Nicole Kindred asks that patient be admitted to hospitalist.   Shaune Pollack, MD 12/05/13 [phone]

## 2013-12-05 NOTE — H&P (Addendum)
[facility] History and Physical  [name] OEU:[id number] DOB: December 18, 1960 DOA: 12/05/2013  Referring physician: ED PCP: Chari Manning, NP  Specialists: Neurology  Chief Complaint: stroke  HPI: 53 y/o ?, htn, dm ty 2, bipolar with rpior admits to [facility] for paranoid schizophrenia , admit 05/2013 Multiple areas CVA [R MCA, L thalamus [no TEE 2/2 to hypertension]-followed by PMR Dr. Letta Pate for Persisting R hemiparesis and severe dysphagia and had a PEG tube placed during last hospital stay which was subsequently d/c'  She was last seen normal 08:00 this am by daughter-daughter returned home 10:00 am to find mother visibly less responsive, obvious drooping R side face and echolalia-seeming to understand conversation but saying only "thank you" i response to multiple questions where that response would not be appropriate It appears she was seen in the ED 12/02/13 for c/o tachcyardia-d-dimer performed at that time was negative still CT entry and transfers not performed She also has history of right shoulder subluxation found on that visit as well Urinalysis small ketones toxic negative CT head stable appearance from old L. MCA infarct Potassium 3.3 glucose 140 9 his calcium 1.11 Hemoglobin 12 hematocrit 38, none elevated white count   Review of Systems:  Cannot be obtained  Past Medical History  Diagnosis Date  . Hypertension   . Depression   . Diabetes mellitus     ?type (05/29/2013)  . Bipolar affective disorder   . Schizophrenia     [name] 11/28/2007 (05/29/2013)  . Stroke 05/27/2013   Past Surgical History  Procedure Laterality Date  . Cesarean section  ~ 1987  . Tubal ligation     Social History:  History   Social History Narrative  . No narrative on file    No Known Allergies  Family History  Problem Relation Age of Onset  . Hypertension Mother   . Stroke Father   . Hypertension Father   . Diabetes Father     Prior to Admission medications   Medication  Sig Start Date End Date Taking? Authorizing Provider  acetaminophen (TYLENOL) 325 MG tablet Take 1-2 tablets (325-650 mg total) by mouth every 4 (four) hours as needed for mild pain (available over the counter). 07/08/13  Yes Ivan Anchors Love, PA-C  amLODipine (NORVASC) 10 MG tablet Take 10 mg by mouth daily. For blood pressure 08/12/13  Yes Charlett Blake, MD  aspirin 325 MG tablet Place 1 tablet (325 mg total) into feeding tube daily. 07/08/13  Yes Ivan Anchors Love, PA-C  baclofen (LIORESAL) 10 MG tablet Take 1 tablet (10 mg total) by mouth 2 (two) times daily. To help with tone/spasms 11/07/13  Yes Lance Bosch, NP  gabapentin (NEURONTIN) 100 MG capsule Take 1 capsule (100 mg total) by mouth 3 (three) times daily. 11/07/13  Yes Lance Bosch, NP  hydrochlorothiazide (HYDRODIURIL) 25 MG tablet Take 25 mg by mouth daily.   Yes Historical Provider, MD  Insulin Glargine (LANTUS SOLOSTAR) 100 UNIT/ML Solostar Pen Inject 20 Units into the skin 2 (two) times daily. With breakfast and supper 08/22/13  Yes Olugbemiga E Doreene Burke, MD  labetalol (NORMODYNE) 300 MG tablet 1 tablet (300 mg total) by Per NG tube route 3 (three) times daily. 08/12/13  Yes Charlett Blake, MD  polyethylene glycol powder (GLYCOLAX/MIRALAX) powder Take 17 g by mouth daily. 08/22/13  Yes Lance Bosch, NP  potassium chloride SA (K-DUR,KLOR-CON) 20 MEQ tablet Take 1 tablet (20 mEq total) by mouth 2 (two) times daily. 12/02/13  Yes  Shaune Pollack, MD  simvastatin (ZOCOR) 20 MG tablet Place 1 tablet (20 mg total) into feeding tube daily at 6 PM. 08/12/13  Yes Charlett Blake, MD   Physical Exam: Filed Vitals:   12/05/13 1130 12/05/13 1145 12/05/13 1200 12/05/13 1215  BP: 144/88 137/87 138/91 133/93  Pulse: 132 123 125 121  Temp:      TempSrc:      Resp: 20 34 28 25  SpO2: 100% 100% 100% 100%     General:  Alert, understand conversation only responds "thank you auscultation to questions  Eyes: EOMI NCAT equally reactive to  light  ENT: Discernible droop to right side of face  Neck: Soft supple  Cardiovascular: S1-S2 tachycardic  Respiratory: Clinically clear  Abdomen: Soft nontender nondistended no rebound or guarding  Skin: No lower extremity edema  Musculoskeletal: Range of motion intact Except as noted  Psychiatric: Flat affect  Neurologic: Dense hemiparesis/plegia right upper extremity, able to move with a flicker 3/5 power right lower extremity reflexes are brisk throughout, sensory is intact but cannot release confirmed cerebellar or sensory completely given inability to discuss with patient  Labs on Admission:  Basic Metabolic Panel:  Recent Labs Lab 12/02/13 1823 12/05/13 1049 12/05/13 1058  NA 143 142 143  K 3.1* 3.6* 3.3*  CL 100 101 102  CO2 27 28  --   GLUCOSE 91 140* 140*  BUN 11 11 9   CREATININE 0.70 0.73 0.80  CALCIUM 9.6 9.5  --    Liver Function Tests:  Recent Labs Lab 12/05/13 1049  AST 23  ALT 16  ALKPHOS 57  BILITOT 0.4  PROT 7.5  ALBUMIN 3.8   No results found for this basename: LIPASE, AMYLASE,  in the last 168 hours No results found for this basename: AMMONIA,  in the last 168 hours CBC:  Recent Labs Lab 12/02/13 1823 12/05/13 1049 12/05/13 1058  WBC 7.0 7.7  --   NEUTROABS 3.5 3.5  --   HGB 13.6 12.4 12.9  HCT 39.4 36.5 38.0  MCV 85.7 88.2  --   PLT 333 268  --    Cardiac Enzymes: No results found for this basename: CKTOTAL, CKMB, CKMBINDEX, TROPONINI,  in the last 168 hours  BNP (last 3 results) No results found for this basename: PROBNP,  in the last 8760 hours CBG: No results found for this basename: GLUCAP,  in the last 168 hours  Radiological Exams on Admission: Ct Head Wo Contrast  12/05/2013   CLINICAL DATA:  Code stroke. Right-sided facial droop and slurred speech.  EXAM: CT HEAD WITHOUT CONTRAST  TECHNIQUE: Contiguous axial images were obtained from the base of the skull through the vertex without intravenous contrast.   COMPARISON:  CT head without contrast 10/15/2013.  FINDINGS: The remote left MCA infarct is not significantly changed. This involves left insular, frontal lobe, and temporal lobe. There is ex vacuo dilation of the left lateral ventricle. No acute cortical infarct or definite extension is evident. There is no hemorrhage or mass lesion. Wallerian degeneration of the left cerebral peduncle and brainstem are noted. Mild generalized white matter disease is is stable as well.  The paranasal sinuses and mastoid air cells are clear. The osseous skull is intact.  IMPRESSION: 1. Stable appearance of remote left MCA territory infarct. 2. No acute intracranial abnormality or significant interval change.   Electronically Signed   By: Lawrence Santiago M.D.   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 This was found to be concordant by Dr. Pamelia Hoit. Pathology was discussed with the patient's daughter [name], by telephone, at the patient's request. She reported that her mother had done well after the biopsy with no change in the large post biopsy hematoma that she acquired. The area is tender to the touch, but without bruising or bleeding at the present time. Post biopsy instructions and care were reviewed and her questions were answered. She was encouraged to call [facility] for any additional concerns. The patient is asked to return for annual screening mammography.  Pathology results reported by Susa Raring RN, BSN on December 04, 2013.   Electronically Signed   By: Pamelia Hoit M.D.   On: 12/04/2013 11:49   12/04/2013   CLINICAL DATA:  53 year old female with suspicious right breast calcifications  EXAM: RIGHT BREAST STEREOTACTIC CORE NEEDLE BIOPSY  COMPARISON:  Previous exams.  FINDINGS: The patient and I discussed the  procedure of stereotactic-guided biopsy including benefits and alternatives. We discussed the high likelihood of a successful procedure. We discussed the risks of the procedure including infection, bleeding, tissue injury, clip migration, and inadequate sampling. Informed written consent was given. The usual time out protocol was performed immediately prior to the procedure.  Using sterile technique and 2% Lidocaine as local anesthetic, under stereotactic guidance, a 9 gauge vacuum assist biopsy device was used to perform core needle biopsy of calcifications in the upper, inner quadrant of the right breast using a superior approach. Specimen radiograph was performed showing calcifications within the specimen. Specimens with calcifications are identified for pathology.  At the conclusion of the procedure, an X shaped tissue marker clip was deployed into the biopsy cavity. Follow-up 2-view mammogram confirmed clip placement. A post biopsy hematoma is evident.  IMPRESSION: Stereotactic-guided biopsy of the right breast.  Electronically Signed: By: Pamelia Hoit M.D. On: 12/03/2013 15:16    EKG: Independently reviewed. Sinus sinus tachycardia PR interval 0.12 QRS axis 80 no acute ST-T wave changes unchanged compared to EKG done on the 15th  Assessment/Plan Principal Problem:   Cerebral embolism with cerebral infarction-neurology consulted recommend getting a stat MRI. Admitted under stroke orderset. Get labs as appropriate. Some underlying concern for this being a seizure disorder, will obtain stat EEG and antiepileptics as deemed appropriate by neurology, rectal aspirin 300 mg until swallow eval performed Active Problems:   DM (diabetes mellitus) type II controlled, neurological manifestation-tight glycemic control.  Will need sliding scale coverage. Cut in half home insulin/Lantus to 10 units twice a day until 10 possible screen. If not able to do so we'll need to keep n.p.o.   Aphasia complicating stroke-see  above discussion   Sinus tachycardia-unclear etiology. Daughter relates that patient ran out of blood pressure medications but weak but have not received them in the mail order pharmacy. Would recommend getting TSH and could potentially benefit from pain medication given her subluxation of the shoulder which is chronic. This may be the cause for this. If this persists during workup she will get an echo regardless. At this time my suspicion for this being pulmonary embolism is low although it remains in the differential. When it is safe to aggressively control blood pressure, we will reimplement labetalol and see respon   full Discuss with daughter Step down  Time spent:  40  minutes  Nita Sells [facility] Pager [phone]  If 7PM-7AM, please contact night-coverage [email or web address] Password [email or web address] 12/05/2013, 1:21 PM

## 2013-12-05 NOTE — Progress Notes (Signed)
STAT EEG completed  

## 2013-12-06 DIAGNOSIS — I69992 Facial weakness following unspecified cerebrovascular disease: Secondary | ICD-10-CM

## 2013-12-06 LAB — CBC WITH DIFFERENTIAL/PLATELET
BASOS PCT: 0 % (ref 0–1)
Basophils Absolute: 0 10*3/uL (ref 0.0–0.1)
EOS ABS: 0 10*3/uL (ref 0.0–0.7)
Eosinophils Relative: 1 % (ref 0–5)
HCT: 34 % — ABNORMAL LOW (ref 36.0–46.0)
Hemoglobin: 11.8 g/dL — ABNORMAL LOW (ref 12.0–15.0)
Lymphocytes Relative: 34 % (ref 12–46)
Lymphs Abs: 2.1 10*3/uL (ref 0.7–4.0)
MCH: 29.7 pg (ref 26.0–34.0)
MCHC: 34.7 g/dL (ref 30.0–36.0)
MCV: 85.6 fL (ref 78.0–100.0)
Monocytes Absolute: 0.3 10*3/uL (ref 0.1–1.0)
Monocytes Relative: 5 % (ref 3–12)
NEUTROS ABS: 3.6 10*3/uL (ref 1.7–7.7)
Neutrophils Relative %: 60 % (ref 43–77)
PLATELETS: 274 10*3/uL (ref 150–400)
RBC: 3.97 MIL/uL (ref 3.87–5.11)
RDW: 12.2 % (ref 11.5–15.5)
WBC: 6 10*3/uL (ref 4.0–10.5)

## 2013-12-06 LAB — GLUCOSE, CAPILLARY
GLUCOSE-CAPILLARY: 117 mg/dL — AB (ref 70–99)
GLUCOSE-CAPILLARY: 108 mg/dL — AB (ref 70–99)
Glucose-Capillary: 160 mg/dL — ABNORMAL HIGH (ref 70–99)
Glucose-Capillary: 221 mg/dL — ABNORMAL HIGH (ref 70–99)
Glucose-Capillary: 211 mg/dL — ABNORMAL HIGH (ref 70–99)

## 2013-12-06 LAB — LIPID PANEL
Cholesterol: 105 mg/dL (ref 0–200)
HDL: 42 mg/dL (ref >39)
LDL Cholesterol: 50 mg/dL (ref 0–99)
TRIGLYCERIDES: 66 mg/dL (ref <150)
Total CHOL/HDL Ratio: 2.5 RATIO
VLDL: 13 mg/dL (ref 0–40)

## 2013-12-06 LAB — HEMOGLOBIN A1C
HEMOGLOBIN A1C: 5.9 % — AB (ref <5.7)
Mean Plasma Glucose: 123 mg/dL — ABNORMAL HIGH (ref <117)

## 2013-12-06 LAB — BASIC METABOLIC PANEL
Anion gap: 16 — ABNORMAL HIGH (ref 5–15)
BUN: 8 mg/dL (ref 6–23)
CO2: 23 mEq/L (ref 19–32)
Calcium: 8.9 mg/dL (ref 8.4–10.5)
Chloride: 104 mEq/L (ref 96–112)
Creatinine, Ser: 0.59 mg/dL (ref 0.50–1.10)
Glucose, Bld: 97 mg/dL (ref 70–99)
POTASSIUM: 3.5 meq/L — AB (ref 3.7–5.3)
Sodium: 143 mEq/L (ref 137–147)

## 2013-12-06 MED ORDER — SIMVASTATIN 40 MG PO TABS
40.0000 mg | ORAL_TABLET | Freq: Every day | ORAL | Status: DC
  Administered 2013-12-06 – 2013-12-07 (×2): 40 mg via ORAL
  Filled 2013-12-06 (×4): qty 1

## 2013-12-06 MED ORDER — ACETAMINOPHEN 325 MG PO TABS
325.0000 mg | ORAL_TABLET | ORAL | Status: DC | PRN
  Administered 2013-12-07: 650 mg via ORAL
  Filled 2013-12-06: qty 2

## 2013-12-06 MED ORDER — POTASSIUM CHLORIDE 10 MEQ/100ML IV SOLN
10.0000 meq | INTRAVENOUS | Status: AC
  Administered 2013-12-06 (×3): 10 meq via INTRAVENOUS
  Filled 2013-12-06 (×3): qty 100

## 2013-12-06 MED ORDER — LABETALOL HCL 300 MG PO TABS
300.0000 mg | ORAL_TABLET | Freq: Three times a day (TID) | ORAL | Status: DC
  Administered 2013-12-06 (×2): 300 mg via ORAL
  Filled 2013-12-06 (×6): qty 1

## 2013-12-06 MED ORDER — DEXTROSE 5 % IV SOLN
INTRAVENOUS | Status: DC
  Administered 2013-12-06: 1000 mL via INTRAVENOUS
  Administered 2013-12-06: 75 mL via INTRAVENOUS
  Administered 2013-12-07: 18:00:00 via INTRAVENOUS

## 2013-12-06 MED ORDER — ASPIRIN 325 MG PO TABS
325.0000 mg | ORAL_TABLET | Freq: Every day | ORAL | Status: DC
  Administered 2013-12-06 – 2013-12-08 (×3): 325 mg via ORAL
  Filled 2013-12-06 (×4): qty 1

## 2013-12-06 NOTE — Progress Notes (Signed)
PT Cancellation Note  Patient Details Name: [name] MRN: [id number] DOB: 03-17-1961   Cancelled Treatment:    Reason Eval/Treat Not Completed: Medical issues which prohibited therapy (resting HR in 140's)   Jansen Goodpasture 12/06/2013, 3:02 PM

## 2013-12-06 NOTE — Procedures (Signed)
ELECTROENCEPHALOGRAM REPORT  Patient: [name]       Room #: 2T55 EEG No. ID: [id number] Age: 53 y.o.        Sex: female Referring Physician: Dr Jeanell Sparrow Report Date:  12/06/2013        Interpreting Physician: Anthony Sar  History: [name] is an 53 y.o. female with a history of large left MCA territory stroke, presenting with acute worsening of aphasia and right facial droop, as well as repetitive speech output content.  Indications for study:  Rule out focal status epilepticus.  Technique: This is an 18 channel routine scalp EEG performed at the bedside with bipolar and monopolar montages arranged in accordance to the international 10/20 system of electrode placement.   Description: This EEG recording was performed during wakefulness. Background cerebral activity was asymmetric with moderately severe continuous slowing of cerebral activity recorded from the left hemisphere, and normal activity, including posterior alpha rhythm recorded from the right hemisphere. Photic stimulation and hyperventilation were not performed. No epileptiform discharges are recorded.  Interpretation: This EEG is abnormal with continuous moderately severe slowing of cerebral activity recorded from the left hemisphere, consistent with history of large left MCA stroke. No evidence of seizure activity was seen.   Rush Farmer M.D. [facility] [phone]

## 2013-12-06 NOTE — Progress Notes (Signed)
PT Cancellation Note  Patient Details Name: [name] MRN: [id number] DOB: 10/03/1960   Cancelled Treatment:    Reason Eval/Treat Not Completed: Medical issues which prohibited therapy (Pt with resting HR of 147.)   Hadleigh Felber 12/06/2013, 9:17 AM

## 2013-12-06 NOTE — Progress Notes (Signed)
Note: This document was prepared with digital dictation and possible smart phrase technology. Any transcriptional errors that result from this process are unintentional.   [name] BLT:[id number] DOB: 06-20-1960 DOA: 12/05/2013 PCP: Chari Manning, NP  Brief narrative: 53 y/o ?, htn, dm ty 2, bipolar with rpior admits to [facility] for paranoid schizophrenia , admit 05/2013 Multiple areas CVA [R MCA, L thalamus [no TEE 2/2 to hypertension]-followed by PMR Dr. Letta Pate for Persisting R hemiparesis and severe dysphagia and had a PEG tube placed during last hospital stay which was subsequently d/c  3 admitted/18/15 with episodic unresponsiveness, repetitive echolalia, sinus tachycardia  Past medical history-As per Problem list Chart reviewed as below- Reviewed  Consultants:  Neurology  Procedures:  EEG  Antibiotics:  None   Subjective   Alert oriented but still unable to speak clearly Response thank you to multiple questions Seems to have had an uneventful night    Objective    Interim History:   Telemetry: Sinus tachycardia 150s   Objective: Filed Vitals:   12/06/13 0100 12/06/13 0300 12/06/13 0420 12/06/13 0600  BP: 134/90 136/88 125/87 138/91  Pulse: 130 136 136 134  Temp:   98.4 F (36.9 C)   TempSrc:   Oral   Resp: 20 22 23 22   Height:      Weight:      SpO2: 100% 100% 100% 100%    Intake/Output Summary (Last 24 hours) at 12/06/13 0819 Last data filed at 12/06/13 0700  Gross per 24 hour  Intake   1225 ml  Output    200 ml  Net   1025 ml    Exam:  General: Alert oriented Cardiovascular: S1-S2 tachycardic Respiratory: Clinically clear Abdomen: Nondistended Skin no lower extremity edema Neuro unchanged exam as per prior-persisting right-sided deficits more so right upper extremity and left, facial drooping on the right side  Data Reviewed: Basic Metabolic Panel:  Recent Labs Lab 12/02/13 1823 12/05/13 1049 12/05/13 1058  NA 143 142 143    K 3.1* 3.6* 3.3*  CL 100 101 102  CO2 27 28  --   GLUCOSE 91 140* 140*  BUN 11 11 9   CREATININE 0.70 0.73 0.80  CALCIUM 9.6 9.5  --    Liver Function Tests:  Recent Labs Lab 12/05/13 1049  AST 23  ALT 16  ALKPHOS 57  BILITOT 0.4  PROT 7.5  ALBUMIN 3.8   No results found for this basename: LIPASE, AMYLASE,  in the last 168 hours No results found for this basename: AMMONIA,  in the last 168 hours CBC:  Recent Labs Lab 12/02/13 1823 12/05/13 1049 12/05/13 1058  WBC 7.0 7.7  --   NEUTROABS 3.5 3.5  --   HGB 13.6 12.4 12.9  HCT 39.4 36.5 38.0  MCV 85.7 88.2  --   PLT 333 268  --    Cardiac Enzymes: No results found for this basename: CKTOTAL, CKMB, CKMBINDEX, TROPONINI,  in the last 168 hours BNP: No components found with this basename: POCBNP,  CBG:  Recent Labs Lab 12/05/13 2226 12/06/13 0422  GLUCAP 108* 117*    Recent Results (from the past 240 hour(s))  MRSA PCR SCREENING     Status: None   Collection Time    12/05/13  5:21 PM      Result Value Ref Range Status   MRSA by PCR NEGATIVE  NEGATIVE Final   Comment:            The GeneXpert MRSA Assay (  FDA     approved for NASAL specimens     only), is one component of a     comprehensive MRSA colonization     surveillance program. It is not     intended to diagnose MRSA     infection nor to guide or     monitor treatment for     MRSA infections.     Studies:              All Imaging reviewed and is as per above notation   Scheduled Meds: .  stroke: mapping our early stages of recovery book   Does not apply Once  . aspirin  300 mg Rectal Daily  . enoxaparin (LOVENOX) injection  40 mg Subcutaneous Q24H  . insulin glargine  10 Units Subcutaneous BID   Continuous Infusions: . sodium chloride 100 mL/hr at 12/06/13 0518     Assessment/Plan: 1.  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   EEG does not show seizure activity.  Defer management to  neurology-please note last admission patient did not have TEE although I do not know if this has a bearing on her current situation. Speech therapy to see patient and screen for dysphagia-she remains n.p.o. therefore rectal aspirin 2. sinus tachycardia unclear etiology-TSH 1.6. Potentially secondary labetalol withdrawal causing abrupt sinus tach as she was taking 300 3 times a day up to one week prior.   3. Diabetes mellitus, A1c 5.9-blood sugars 108-117. Remain n.p.o. Continue saline 100 cc per hour and will switch to D5 75 cc/hour 4. Right shoulder subluxation-outpatient management orthopedics  Code Status:  Full Family Communication: Monti, [place] Daughter [phone] Disposition Plan: Inpatient SDU.  tx to tele when sinus tach better   Verneita Griffes, MD  [facility] Pager [phone] 12/06/2013, 8:19 AM    LOS: 1 day

## 2013-12-06 NOTE — Progress Notes (Signed)
STROKE TEAM PROGRESS NOTE   HISTORY:   [name] is an 53 y.o. female with left MCA stroke in 05/2013, DM, HTN and schizophrenia, brought to the ED as code stroke following the onset of rt facial droop and confusion with worsening of aphasia beyond baseline residual from previous stroke. CT showed old large left MCA infarct, but no acute changes.NIH stroke score was 12. She was beyond the time window for treatment with tPA. Possible new onset partial seizure disorder was also a consideration. She had no response to Ativan 1 mg i.v. EEG and MRI are pending.  LSN: 0800 on 12/05/2013.  tPA Given: No: beyond time window  MRankin: 3  SUBJECTIVE (INTERVAL HISTORY) Patient is alone, no family at the bedside. Exam is at the bedside. This is a 53 year old female who follow outpatient wit Dr. Leonie Man last seen in the office on 11/25/2013 for follow up post hospital discharge. She has a history of HTN, DM, bipolar disorder, who was admitted on 05/27/13 with right facial droop and aphasia. 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 No other intracranial flow-limiting stenosis or embolic disease evident. Areas of low density in the left MCA territory consistent with infarction. 2D Echocardiogram at that time with EF of 55-60% with no source of embolus. TEE was not done due to hypotension in hospital. Patient with resultant right hemiparesis and aphasia.  Today at the bedside patient has global aphasia and unable to gather further history.   OBJECTIVE Temp:  [98.4 F (36.9 C)-99.7 F (37.6 C)] 99.7 F (37.6 C) (09/19 1237) Pulse Rate:  [118-144] 144 (09/19 1237) Cardiac Rhythm:  [-] Sinus tachycardia (09/18 2000) Resp:  [15-27] 23 (09/19 1237) BP: (125-170)/(87-115) 170/115 mmHg (09/19 1213) SpO2:  [99 %-100 %] 100 % (09/19 1237) Weight:  [124 lb 12.5 oz (56.6 kg)] 124 lb 12.5 oz  (56.6 kg) (09/18 1703)   Recent Labs Lab 12/05/13 2226 12/06/13 0422 12/06/13 0816 12/06/13 1213  GLUCAP 108* 117* 108* 160*    Recent Labs Lab 12/02/13 1823 12/05/13 1049 12/05/13 1058 12/06/13 0910  NA 143 142 143 143  K 3.1* 3.6* 3.3* 3.5*  CL 100 101 102 104  CO2 27 28  --  23  GLUCOSE 91 140* 140* 97  BUN 11 11 9 8   CREATININE 0.70 0.73 0.80 0.59  CALCIUM 9.6 9.5  --  8.9    Recent Labs Lab 12/05/13 1049  AST 23  ALT 16  ALKPHOS 57  BILITOT 0.4  PROT 7.5  ALBUMIN 3.8    Recent Labs Lab 12/02/13 1823 12/05/13 1049 12/05/13 1058 12/06/13 0910  WBC 7.0 7.7  --  6.0  NEUTROABS 3.5 3.5  --  3.6  HGB 13.6 12.4 12.9 11.8*  HCT 39.4 36.5 38.0 34.0*  MCV 85.7 88.2  --  85.6  PLT 333 268  --  274   No results found for this basename: CKTOTAL, CKMB, CKMBINDEX, TROPONINI,  in the last 168 hours  Recent Labs  12/05/13 1049  LABPROT 13.2  INR 1.00    Recent Labs  12/05/13 1212  COLORURINE AMBER*  LABSPEC 1.028  PHURINE 5.0  GLUCOSEU NEGATIVE  HGBUR NEGATIVE  BILIRUBINUR SMALL*  KETONESUR 15*  PROTEINUR NEGATIVE  UROBILINOGEN 1.0  NITRITE NEGATIVE  LEUKOCYTESUR NEGATIVE       Component Value Date/Time   CHOL 105 12/06/2013 0345  TRIG 66 12/06/2013 0345   HDL 42 12/06/2013 0345   CHOLHDL 2.5 12/06/2013 0345   VLDL 13 12/06/2013 0345   LDLCALC 50 12/06/2013 0345   Lab Results  Component Value Date   HGBA1C 5.9* 12/05/2013      Component Value Date/Time   LABOPIA NONE DETECTED 12/05/2013 2221   COCAINSCRNUR NONE DETECTED 12/05/2013 2221   LABBENZ NONE DETECTED 12/05/2013 2221   AMPHETMU NONE DETECTED 12/05/2013 2221   THCU NONE DETECTED 12/05/2013 2221   LABBARB NONE DETECTED 12/05/2013 2221     Recent Labs Lab 12/05/13 1049  ETH <11    Ct Head Wo Contrast  12/05/2013    IMPRESSION: 1. Stable appearance of remote left MCA territory infarct. 2. No acute intracranial abnormality or significant interval change.    [name]  Contrast  12/05/2013     IMPRESSION: No evidence of acute ischemia, specifically no acute LEFT sided stroke.  Remote large LEFT middle cerebral artery territory hemorrhagic infarct.  Similar abnormal signal within the basal ganglia and pons could reflect metabolic disorder, sequelae of possible PRES, less likely pontine/extra pontine myelinolysis, findings are similar.  Remote bilateral thalamus lacunar infarcts. Moderate white matter changes suggest chronic small vessel ischemic disease.   Electronically Signed   By: Elon Alas   On: 12/05/2013 22:06     PHYSICAL EXAM  Physical exam: Exam: Gen: NAD Eyes: anicteric sclerae, moist conjunctivae HENT: Atraumatic, oropharynx clear Neck: Trachea midline; supple,                   CV: tachycardic Abdomen: Soft, non-tender;  Extremities: No peripheral edema   Neuro: Detailed Neurologic Exam  Speech:    Global aphasia. Follows commands if given visual cues. When asked what her name is, says "thank you".  Cognition:    The patient is not oriented to person, place, and time;  Cranial Nerves:    The pupils are equal, round, and reactive to light. Extraocular movements are intact. Conjugate gaze. Blinks to threat.  Right facial droop. The tongue deviates to the right.   Motor Observation:    No asymmetry, no atrophy, and no involuntary movements noted. Tone:    Increased right arm > right leg   Strength: No movement of right arm. Right leg anti-gravity. Left arm and leg spontaneous movement with anti-gravity. Not following commands so difficult motor exam.      Light Touch:    No withdrawal right arm. Withdraws other extremities.   Reflex Exam:  DTR's:    Brisk right > left Toes:    Rt upgoing, left downgoing  Clonus:    Clonus is absent.   ASSESSMENT/PLAN  [name] is an 53 y.o. female with left MCA stroke in 05/2013, DM, HTN and schizophrenia, brought to the ED as code stroke following the onset of rt facial droop  and confusion with worsening of aphasia beyond baseline residual from previous stroke. She did not received IV Tpa as she was out of the tpa window. Exam is significant for global aphasia which is worse than her recent outpatient exam of only expressive aphasia. Right-sided hemiparesis/plegia appears consistent with sequlae from stroke in March 2015. MRi of the brain does not show acute abnormality; remote large LEFT middle cerebral artery territory hemorrhagic infarct.    Stroke vs TIA vs encephalopathy with worsening of old stroke sequelae vs seizure  LDL 50, continue zocor necessary as meeting goal of LDL <100  HgbA1c 5.9  Consider infectious workup  and eeg   Previous stroke was suspected embolic and TEE outpatient was being considered. Suggest if no other etiology is found to consider TEE inpatient or possible implantable loop recorder.   Continue ASA until workup complete. May consider Plavix after workup results reviewed.    Hypertension   Home meds: resume when possibke  SBP goal <130/90  Hyperlipidemia  Home meds resumed when possible  LDL goal < 100 (<70 for diabetics)  Diabetes  HgbA1c 5.9  Controlled  Goal < 7.0  Other Stroke Risk Factors Advanced age   Hx stroke/TIA   Georgia Dom, MD Stroke Neurology   To contact Stroke Continuity provider, please refer to [email or web address] After hours, contact General Neurology

## 2013-12-06 NOTE — Evaluation (Addendum)
Clinical/Bedside Swallow Evaluation Patient Details  Name: SYBRINA LANING MRN: [id number] Date of Birth: December 06, 1960  Today's Date: 12/06/2013 Time: 3016-0109 SLP Time Calculation (min): 20 min  Past Medical History:  Past Medical History  Diagnosis Date  . Hypertension   . Depression   . Diabetes mellitus     ?type (05/29/2013)  . Bipolar affective disorder   . Schizophrenia     [name] 11/28/2007 (05/29/2013)  . Stroke 05/27/2013   Past Surgical History:  Past Surgical History  Procedure Laterality Date  . Cesarean section  ~ 1987  . Tubal ligation     HPI:   Pt is a 53 y/o female with a pmhx significant for: HTN, dm ty 2, bipolar with prior admits to [facility] for paranoid schizophrenia , admit 05/2013 Multiple areas CVA R MCA, L thalamus no TEE 2/2 to hypertension-followed by PMR Dr. Letta Pate for Persisting R hemiparesis and severe dysphagia and had a PEG tube placed during last hospital stay which was subsequently d/c d/t increased oral intake; MBS completed 06/24/13 indicating severe oral phase dysphagia and mild pharyngeal dysphagia with SLP to feed prior to upgrading diet   Assessment / Plan / Recommendation Clinical Impression  Pt exhibited no overt s/s of aspiration during BSE; however, pt has history of severe dysphagia (oral phase) and mild pharyngeal dysphagia; last MBS completed 06/2013 with this dx given and PEG continued at this time; PEG removed 08/25/13 d/t improved oral intake and pt request. During BSE, pt consumed thin with small sips and puree, mech soft consistencies with minimally decreased anterior-posterior propulsion with mech soft consistency primarily, but it should be noted pt independently lateralized tongue to left side to masticate bolus with minimal residue noted in left sulcus and cleared with subsequent repetitive swallow; recommended Dysphagia 3/thin with small sips, but pt should be closely followed for need of repeat MBS/diet toelrance d/t history of dysphagia  and new neurological insult    Aspiration Risk  Mild    Diet Recommendation Dysphagia 3 (Mechanical Soft);Thin liquid   Liquid Administration via: Cup;No straw Medication Administration: Whole meds with puree Supervision: Patient able to self feed;Intermittent supervision to cue for compensatory strategies Compensations: Slow rate;Small sips/bites Postural Changes and/or Swallow Maneuvers: Out of bed for meals;Seated upright 90 degrees;Upright 30-60 min after meal    Other  Recommendations Recommended Consults: Other (Comment) (possible repeat MBS d/t history of dysphagia); pt needs SLE d/t significant expressive aphasia/apraxia   Follow Up Recommendations  Inpatient Rehab    Frequency and Duration min 2x/week  2 weeks   Pertinent Vitals/Pain Elevated HR; low grade fever    SLP Swallow Goals  see POC   Swallow Study Prior Functional Status  Lives with daughter; recent Inpatient Rehab admission (April 2015)    General Date of Onset: 12/05/13 HPI:  Pt is a 53 y/o female with a pmhx significant for: HTN, dm ty 2, bipolar with prior admits to [facility] for paranoid schizophrenia , admit 05/2013 Multiple areas CVA R MCA, L thalamus no TEE 2/2 to hypertension-followed by PMR Dr. Letta Pate for Persisting R hemiparesis and severe dysphagia and had a PEG tube placed during last hospital stay which was subsequently d/c d/t increased oral intake; MBS completed 06/24/13 indicating severe oral phase dysphagia and mild pharyngeal dysphagia with SLP to feed prior to upgrading diet Type of Study: Bedside swallow evaluation Previous Swallow Assessment: MBS 06/24/13 indicating severe oral phase dysphagia; mild pharyngeal phase dysphagia Diet Prior to this Study: NPO Temperature Spikes Noted: No Respiratory  Status: Room air History of Recent Intubation: No Behavior/Cognition: Alert;Cooperative;Other (comment) (severe expressive aphasia; apraxia) Oral Cavity - Dentition: Adequate natural  dentition Self-Feeding Abilities: Able to feed self Patient Positioning: Upright in bed Baseline Vocal Quality: Clear Volitional Cough: Other (Comment) (unable to elicit ) Volitional Swallow: Able to elicit    Oral/Motor/Sensory Function Overall Oral Motor/Sensory Function: Impaired at baseline Labial ROM: Other (Comment) (reduced overall) Labial Symmetry: Other (Comment) (unable to assess d/t severe apraxia) Labial Strength: Within Functional Limits Lingual ROM: Other (Comment) (unable to assess d/t severe apraxia) Lingual Symmetry: Other (Comment) (unable to assess) Lingual Strength: Other (Comment) (unable to assess d/t severe apraxia) Facial Symmetry: Within Functional Limits Facial Strength: Within Functional Limits Velum:  (unable to assess) Mandible: Within Functional Limits   Ice Chips Ice chips: Not tested   Thin Liquid Thin Liquid: Within functional limits Presentation: Cup    Nectar Thick Nectar Thick Liquid: Not tested   Honey Thick Honey Thick Liquid: Not tested   Puree Puree: Within functional limits Presentation: Self Fed   Solid       Solid: Impaired Presentation: Spoon Oral Phase Impairments: Impaired anterior to posterior transit Oral Phase Functional Implications: Other (comment) (pt chews bolus independently on left side )       Latravis Grine,PAT, M.S., CCC-SLP 12/06/2013,1:27 PM

## 2013-12-07 DIAGNOSIS — I519 Heart disease, unspecified: Secondary | ICD-10-CM

## 2013-12-07 LAB — GLUCOSE, CAPILLARY
GLUCOSE-CAPILLARY: 149 mg/dL — AB (ref 70–99)
GLUCOSE-CAPILLARY: 101 mg/dL — AB (ref 70–99)
Glucose-Capillary: 168 mg/dL — ABNORMAL HIGH (ref 70–99)

## 2013-12-07 LAB — BASIC METABOLIC PANEL
Anion gap: 10 (ref 5–15)
BUN: 4 mg/dL — AB (ref 6–23)
CALCIUM: 8.4 mg/dL (ref 8.4–10.5)
CHLORIDE: 104 meq/L (ref 96–112)
CO2: 26 mEq/L (ref 19–32)
Creatinine, Ser: 0.67 mg/dL (ref 0.50–1.10)
GFR calc Af Amer: >90 mL/min (ref >90)
GFR calc non Af Amer: >90 mL/min (ref >90)
Glucose, Bld: 165 mg/dL — ABNORMAL HIGH (ref 70–99)
Potassium: 3.5 mEq/L — ABNORMAL LOW (ref 3.7–5.3)
Sodium: 140 mEq/L (ref 137–147)

## 2013-12-07 MED ORDER — METOPROLOL SUCCINATE ER 100 MG PO TB24
200.0000 mg | ORAL_TABLET | Freq: Every day | ORAL | Status: DC
  Administered 2013-12-07: 200 mg via ORAL
  Filled 2013-12-07 (×2): qty 2

## 2013-12-07 MED ORDER — METOPROLOL TARTRATE 100 MG PO TABS
100.0000 mg | ORAL_TABLET | Freq: Once | ORAL | Status: AC
  Administered 2013-12-07: 100 mg via ORAL
  Filled 2013-12-07 (×2): qty 1

## 2013-12-07 NOTE — Evaluation (Signed)
Physical Therapy Evaluation Patient Details Name: [name] MRN: [id number] DOB: Dec 16, 1960 Today's Date: 12/07/2013   History of Present Illness  [name] is an 53 y.o. female with left MCA stroke in 05/2013, DM, HTN and schizophrenia, brought to the ED as code stroke following the onset of rt facial droop and confusion with worsening of aphasia beyond baseline residual from previous stroke. CT showed old large left MCA infarct, but no acute changes.NIH stroke score was 12. She was beyond the time window for treatment with tPA. Possible new onset partial seizure disorder was also a consideration.   Clinical Impression   Pt admitted with above. Pt currently with functional limitations due to the deficits listed below (see PT Problem List).  Pt will benefit from skilled PT to increase their independence and safety with mobility to allow discharge to the venue listed below.       Follow Up Recommendations CIR    Equipment Recommendations  None recommended by PT    Recommendations for Other Services Rehab consult     Precautions / Restrictions Precautions Precautions: Fall      Mobility  Bed Mobility Overal bed mobility: Needs Assistance Bed Mobility: Supine to Sit     Supine to sit: Mod assist     General bed mobility comments: light mod assist to reciprocally scoot hips to EOB  Transfers Overall transfer level: Needs assistance Equipment used: 1 person hand held assist (and support given at pelvis) Transfers: Sit to/from [facility] Sit to Stand: Min assist Stand pivot transfers: Min assist       General transfer comment: tactile cueing to acheive fully upright standing and for full extension R knee in stance  Ambulation/Gait                Stairs            Wheelchair Mobility    Modified Rankin (Stroke Patients Only) Modified Rankin (Stroke Patients Only) Pre-Morbid Rankin Score: Moderately severe  disability Modified Rankin: Moderately severe disability     Balance Overall balance assessment: Needs assistance Sitting-balance support: Single extremity supported;Feet supported Sitting balance-Leahy Scale: Good     Standing balance support: Single extremity supported Standing balance-Leahy Scale: Fair                               Pertinent Vitals/Pain Pain Assessment: No/denies pain VSS    Home Living Family/patient expects to be discharged to:: Private residence Living Arrangements: Children Available Help at Discharge: Family;Available PRN/intermittently;Personal care attendant Type of Home: House Home Access: Stairs to enter Entrance Stairs-Rails: Right;Left Entrance Stairs-Number of Steps: 5-7 steps to enter son's home Home Layout: One level Home Equipment: Environmental consultant - 2 wheels;Cane - quad (Hemiwalker) Additional Comments: Pt's daughter present    Prior Function Level of Independence: Needs assistance   Gait / Transfers Assistance Needed: amb short distances with assist and mostly unilateral assistive device     Comments: working and driving prior to original CVA in March     Hand Dominance   Dominant Hand: Right    Extremity/Trunk Assessment   Upper Extremity Assessment: RUE deficits/detail (Densely hemiparetic)           Lower Extremity Assessment: RLE deficits/detail RLE Deficits / Details: Weakness from previous CVA; may be at baseline; grossly 3/5 quad; 2/5 ankle dorsiflexion       Communication   Communication: Receptive difficulties;Expressive difficulties  Cognition Arousal/Alertness: Awake/alert  Behavior During Therapy: WFL for tasks assessed/performed Overall Cognitive Status: Difficult to assess                      General Comments General comments (skin integrity, edema, etc.): Upon standing, noted pt is incontinent of urine    Exercises        Assessment/Plan    PT Assessment Patient needs continued PT  services  PT Diagnosis Difficulty walking;Hemiplegia dominant side   PT Problem List Decreased strength;Decreased range of motion;Decreased activity tolerance;Decreased balance;Decreased mobility;Decreased coordination;Decreased cognition;Decreased knowledge of use of DME;Decreased safety awareness;Decreased knowledge of precautions;Impaired tone  PT Treatment Interventions DME instruction;Gait training;Stair training;Functional mobility training;Therapeutic activities;Therapeutic exercise;Balance training;Neuromuscular re-education;Cognitive remediation;Patient/family education   PT Goals (Current goals can be found in the Care Plan section) Acute Rehab PT Goals Patient Stated Goal: agreeable to getting up     Frequency Min 4X/week   Barriers to discharge Inaccessible home environment;Decreased caregiver support more steps to enter son's home    Co-evaluation               End of Session Equipment Utilized During Treatment: Gait belt Activity Tolerance: Patient tolerated treatment well Patient left: in chair;with call bell/phone within reach;with family/visitor present Nurse Communication: Mobility status         Time: 7867-6720 PT Time Calculation (min): 26 min   Charges:   PT Evaluation $Initial PT Evaluation Tier I: 1 Procedure PT Treatments $Therapeutic Activity: 8-22 mins   PT G Codes:          Quin Hoop 12/07/2013, 2:43 PM  Roney Marion, [place] Pager [phone] Office [phone]

## 2013-12-07 NOTE — Progress Notes (Signed)
VASCULAR LAB PRELIMINARY  PRELIMINARY  PRELIMINARY  PRELIMINARY  Carotid Dopplers completed.    Preliminary report:  1-39% ICA stenosis.  Vertebral artery flow is antegrade.   Levana Minetti, RVT 12/07/2013, 3:30 PM

## 2013-12-07 NOTE — Progress Notes (Signed)
  Echocardiogram 2D Echocardiogram has been performed.  [name] 12/07/2013, 8:43 AM

## 2013-12-07 NOTE — Progress Notes (Signed)
Utilization Review Completed.   Kripa Foskey, RN, BSN Nurse Case Manager  

## 2013-12-07 NOTE — Progress Notes (Signed)
[name] UXL:[id number] DOB: 1960/07/30 DOA: 12/05/2013 PCP: Chari Manning, NP  Brief narrative: 53 y/o ?, htn, dm ty 2, bipolar with rpior admits to [facility] for paranoid schizophrenia , admit 05/2013 Multiple areas CVA [R MCA, L thalamus [no TEE 2/2 to hypertension]-followed by PMR Dr. Letta Pate for Persisting R hemiparesis and severe dysphagia and had a PEG tube placed during last hospital stay which was subsequently d/c  3 admitted/18/15 with episodic unresponsiveness, repetitive echolalia, sinus tachycardia  Past medical history-As per Problem list Chart reviewed as below- Reviewed  Consultants:  Neurology  Procedures:  EEG  Antibiotics:  None   Subjective   Alert oriented. Still some echolalia. Seems a little improved. HR elevated but improving   Objective    Interim History:   Telemetry: Sinus tachycardia 120   Objective: Filed Vitals:   12/06/13 2200 12/06/13 2255 12/07/13 0420 12/07/13 0600  BP: 149/97 139/96 105/71 138/85  Pulse: 111 113 108 94  Temp:   98.9 F (37.2 C)   TempSrc:   Oral   Resp: 21 20 15 18   Height:      Weight:   58.2 kg (128 lb 4.9 oz)   SpO2: 99% 100% 100% 97%    Intake/Output Summary (Last 24 hours) at 12/07/13 0800 Last data filed at 12/06/13 2200  Gross per 24 hour  Intake 956.25 ml  Output      0 ml  Net 956.25 ml    Exam:  General: Alert oriented Cardiovascular: S1-S2 tachycardic Respiratory: Clinically clear Abdomen: Nondistended Skin no lower extremity edema Neuro unchanged exam as per prior-persisting right-sided deficits more so right upper extremity and left, facial drooping on the right side  Data Reviewed: Basic Metabolic Panel:  Recent Labs Lab 12/02/13 1823 12/05/13 1049 12/05/13 1058 12/06/13 0910 12/07/13 0318  NA 143 142 143 143 140  K 3.1* 3.6* 3.3* 3.5* 3.5*  CL 100 101 102 104 104  CO2 27 28  --  23 26  GLUCOSE 91 140* 140* 97 165*  BUN 11 11 9 8  4*  CREATININE 0.70 0.73 0.80  0.59 0.67  CALCIUM 9.6 9.5  --  8.9 8.4   Liver Function Tests:  Recent Labs Lab 12/05/13 1049  AST 23  ALT 16  ALKPHOS 57  BILITOT 0.4  PROT 7.5  ALBUMIN 3.8   No results found for this basename: LIPASE, AMYLASE,  in the last 168 hours No results found for this basename: AMMONIA,  in the last 168 hours CBC:  Recent Labs Lab 12/02/13 1823 12/05/13 1049 12/05/13 1058 12/06/13 0910  WBC 7.0 7.7  --  6.0  NEUTROABS 3.5 3.5  --  3.6  HGB 13.6 12.4 12.9 11.8*  HCT 39.4 36.5 38.0 34.0*  MCV 85.7 88.2  --  85.6  PLT 333 268  --  274   Cardiac Enzymes: No results found for this basename: CKTOTAL, CKMB, CKMBINDEX, TROPONINI,  in the last 168 hours BNP: No components found with this basename: POCBNP,  CBG:  Recent Labs Lab 12/06/13 0422 12/06/13 0816 12/06/13 1213 12/06/13 1623 12/06/13 2234  GLUCAP 117* 108* 160* 221* 211*    Recent Results (from the past 240 hour(s))  MRSA PCR SCREENING     Status: None   Collection Time    12/05/13  5:21 PM      Result Value Ref Range Status   MRSA by PCR NEGATIVE  NEGATIVE Final   Comment:  The GeneXpert MRSA Assay (FDA     approved for NASAL specimens     only), is one component of a     comprehensive MRSA colonization     surveillance program. It is not     intended to diagnose MRSA     infection nor to guide or     monitor treatment for     MRSA infections.     Studies:              All Imaging reviewed and is as per above notation   Scheduled Meds: .  stroke: mapping our early stages of recovery book   Does not apply Once  . aspirin  325 mg Oral Daily  . enoxaparin (LOVENOX) injection  40 mg Subcutaneous Q24H  . insulin glargine  10 Units Subcutaneous BID  . metoprolol succinate  200 mg Oral Daily  . simvastatin  40 mg Oral q1800   Continuous Infusions: . dextrose 1,000 mL (12/06/13 2015)     Assessment/Plan: 1.  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 Echo not performed  EEG does not show seizure activity.  Defer addition plavix to ASA 325t to neurology-Cardiology will be consulted in am for TEE/Loop 2. sinus tachycardia unclear etiology-TSH 1.6. Potentially secondary labetalol withdrawal.  Start longer acting/affordable B Blocker-Metoprolol 200 mg XL started 12/07/13. 3. Diabetes mellitus, A1c 5.9-blood sugars 160-211. Speech cleared patient for Dys III diet 9/19 4. Right shoulder subluxation-outpatient management orthopedics  Code Status:  Full Family Communication: [name], [name] Daughter [phone] with her at bedside 9/20 Disposition Plan: Inpatient SDU.     Verneita Griffes, MD  [facility] Pager [phone] 12/07/2013, 8:00 AM    LOS: 2 days

## 2013-12-07 NOTE — Progress Notes (Signed)
STROKE TEAM PROGRESS NOTE   HISTORY:   [name] is an 53 y.o. female with left MCA stroke in 05/2013, DM, HTN and schizophrenia, brought to the ED as code stroke following the onset of rt facial droop and confusion with worsening of aphasia beyond baseline residual from previous stroke. CT showed old large left MCA infarct, but no acute changes.NIH stroke score was 12. She was beyond the time window for treatment with tPA. Possible new onset partial seizure disorder was also a consideration. She had no response to Ativan 1 mg i.v. EEG and MRI are pending.  LSN: 0800 on 12/05/2013.  tPA Given: No: beyond time window  MRankin: 3  SUBJECTIVE (INTERVAL HISTORY) Daughter is at the bedside. Patient is back to baseline. Exam is at the bedside. This is a 53 year old female who follows outpatient with Dr. Leonie Compton last seen in the office on 11/25/2013 for follow up post hospital discharge. She has a history of HTN, DM, bipolar disorder, who was admitted on 05/27/13 with right facial droop and aphasia.CT angio head showed severe stenosis of the left middle cerebral artery at the M1 segment with markedly diminished visualization of the more distal left MCA branches. No other intracranial flow-limiting stenosis or embolic disease evident. Areas of low density in the left MCA territory consistent with infarction. 2D Echocardiogram at that time with EF of 55-60% with no source of embolus. TEE was not done due to hypotension in hospital. Patient with resultant right hemiparesis and aphasia.  OBJECTIVE Temp:  [98.6 F (37 C)-99.7 F (37.6 C)] 98.9 F (37.2 C) (09/20 0859) Pulse Rate:  [94-144] 107 (09/20 0859) Cardiac Rhythm:  [-] Sinus tachycardia (09/20 0855) Resp:  [15-27] 20 (09/20 0859) BP: (105-170)/(71-115) 149/98 mmHg (09/20 0859) SpO2:  [97 %-100 %] 100 % (09/20 0859) Weight:  [128 lb 4.9 oz (58.2 kg)] 128 lb 4.9 oz (58.2 kg) (09/20 0420)   Recent Labs Lab 12/06/13 0816 12/06/13 1213  12/06/13 1623 12/06/13 2234 12/07/13 0916  GLUCAP 108* 160* 221* 211* 168*    Recent Labs Lab 12/02/13 1823 12/05/13 1049 12/05/13 1058 12/06/13 0910 12/07/13 0318  NA 143 142 143 143 140  K 3.1* 3.6* 3.3* 3.5* 3.5*  CL 100 101 102 104 104  CO2 27 28  --  23 26  GLUCOSE 91 140* 140* 97 165*  BUN 11 11 9 8  4*  CREATININE 0.70 0.73 0.80 0.59 0.67  CALCIUM 9.6 9.5  --  8.9 8.4    Recent Labs Lab 12/05/13 1049  AST 23  ALT 16  ALKPHOS 57  BILITOT 0.4  PROT 7.5  ALBUMIN 3.8    Recent Labs Lab 12/02/13 1823 12/05/13 1049 12/05/13 1058 12/06/13 0910  WBC 7.0 7.7  --  6.0  NEUTROABS 3.5 3.5  --  3.6  HGB 13.6 12.4 12.9 11.8*  HCT 39.4 36.5 38.0 34.0*  MCV 85.7 88.2  --  85.6  PLT 333 268  --  274   No results found for this basename: CKTOTAL, CKMB, CKMBINDEX, TROPONINI,  in the last 168 hours  Recent Labs  12/05/13 1049  LABPROT 13.2  INR 1.00    Recent Labs  12/05/13 1212  COLORURINE AMBER*  LABSPEC 1.028  PHURINE 5.0  GLUCOSEU NEGATIVE  HGBUR NEGATIVE  BILIRUBINUR SMALL*  KETONESUR 15*  PROTEINUR NEGATIVE  UROBILINOGEN 1.0  NITRITE NEGATIVE  LEUKOCYTESUR NEGATIVE       Component Value Date/Time   CHOL 105 12/06/2013 0345   TRIG  66 12/06/2013 0345   HDL 42 12/06/2013 0345   CHOLHDL 2.5 12/06/2013 0345   VLDL 13 12/06/2013 0345   LDLCALC 50 12/06/2013 0345   Lab Results  Component Value Date   HGBA1C 5.9* 12/05/2013      Component Value Date/Time   LABOPIA NONE DETECTED 12/05/2013 2221   COCAINSCRNUR NONE DETECTED 12/05/2013 2221   LABBENZ NONE DETECTED 12/05/2013 2221   AMPHETMU NONE DETECTED 12/05/2013 2221   THCU NONE DETECTED 12/05/2013 2221   LABBARB NONE DETECTED 12/05/2013 2221     Recent Labs Lab 12/05/13 1049  ETH <11    Ct Head Wo Contrast  12/05/2013    IMPRESSION: 1. Stable appearance of remote left MCA territory infarct. 2. No acute intracranial abnormality or significant interval change.    [name]  Contrast  12/05/2013     IMPRESSION: No evidence of acute ischemia, specifically no acute LEFT sided stroke.  Remote large LEFT middle cerebral artery territory hemorrhagic infarct.  Similar abnormal signal within the basal ganglia and pons could reflect metabolic disorder, sequelae of possible PRES, less likely pontine/extra pontine myelinolysis, findings are similar.  Remote bilateral thalamus lacunar infarcts. Moderate white matter changes suggest chronic small vessel ischemic disease.   Electronically Signed   By: [name]   On: 12/05/2013 22:06     PHYSICAL EXAM  Physical exam: Exam: Gen: NAD Eyes: anicteric sclerae, moist conjunctivae HENT: Atraumatic, oropharynx clear Neck: Trachea midline; supple,                   CV: tachycardic Abdomen: Soft, non-tender;  Extremities: No peripheral edema   Neuro: Detailed Neurologic Exam  Speech/cognition:    Global aphasia. Follows commands, nods to questions. When asked to identify daughter, she pointed to her. When asked if she was fine, she nodded yes. When asked if she was in pain, she nodded no. She says "thank you" frequently which is her baseline.   Cranial Nerves:    The pupils are equal, round, and reactive to light. Extraocular movements are intact. Conjugate gaze. Blinks to threat in all quadrants.  Right facial droop. The tongue deviates to the right.   Motor Observation:    No asymmetry, no atrophy, and no involuntary movements noted. Tone:    Increased right arm > right leg   Strength: No movement of right arm. Right leg anti-gravity. Left arm and leg spontaneous movement with anti-gravity.      Light Touch:    No withdrawal right arm. Withdraws other extremities.   Reflex Exam:  DTR's:    Brisk right > left Toes:    Rt upgoing, left downgoing  Clonus:    Clonus is absent.   ASSESSMENT/PLAN  [name] is an 53 y.o. female with left MCA stroke in 05/2013, DM, HTN and schizophrenia, brought to the  ED as code stroke following the onset of rt facial droop and confusion with worsening of aphasia beyond baseline residual from previous stroke. She did not received IV Tpa as she was out of the tpa window. Exam is significant for global aphasia which is worse than her recent outpatient exam of only expressive aphasia. Right-sided hemiparesis/plegia appears consistent with sequlae from stroke in March 2015. MRi of the brain does not show acute abnormality; remote large LEFT middle cerebral artery territory hemorrhagic infarct.    Stroke vs TIA vs encephalopathy with worsening of old stroke sequelae vs seizure  EEG ordered on 9/18. Report unavailable however primary cares's note  states EEG does not show seizure activity.   LDL 50, continue zocor necessary as meeting goal of LDL <100  HgbA1c 5.9  Previous stroke was suspected embolic and TEE outpatient was being considered. Suggest if no other etiology is found to consider TEE inpatient or possible implantable loop recorder - Cardiology has been consulted for TEE  Continue ASA until workup complete. May consider Plavix after workup results reviewed.    Hypertension   Home meds: resume when possibke  SBP goal <130/90  Hyperlipidemia  Home meds resumed when possible  LDL goal < 100 (<70 for diabetics)  Diabetes  HgbA1c 5.9  Controlled  Goal < 7.0  Other Stroke Risk Factors Advanced age   Hx stroke/TIA  Personally reviewed brain imaging.   Brenda Dom, MD Stroke Neurology   To contact Stroke Continuity provider, please refer to [email or web address] After hours, contact General Neurology

## 2013-12-08 ENCOUNTER — Encounter (HOSPITAL_COMMUNITY): Payer: Self-pay | Admitting: *Deleted

## 2013-12-08 ENCOUNTER — Encounter (HOSPITAL_COMMUNITY)
Admission: EM | Disposition: A | Discharge status: Home Health | Payer: Medicaid Other | Source: Home / Self Care | Attending: Family Medicine

## 2013-12-08 DIAGNOSIS — I6789 Other cerebrovascular disease: Secondary | ICD-10-CM

## 2013-12-08 HISTORY — PX: TEE WITHOUT CARDIOVERSION: SHX5443

## 2013-12-08 LAB — GLUCOSE, CAPILLARY
Glucose-Capillary: 173 mg/dL — ABNORMAL HIGH (ref 70–99)
Glucose-Capillary: 142 mg/dL — ABNORMAL HIGH (ref 70–99)
Glucose-Capillary: 116 mg/dL — ABNORMAL HIGH (ref 70–99)

## 2013-12-08 SURGERY — ECHOCARDIOGRAM, TRANSESOPHAGEAL
Anesthesia: Moderate Sedation

## 2013-12-08 MED ORDER — MIDAZOLAM HCL 5 MG/ML IJ SOLN
INTRAMUSCULAR | Status: AC
  Filled 2013-12-08: qty 2

## 2013-12-08 MED ORDER — FENTANYL CITRATE 0.05 MG/ML IJ SOLN
INTRAMUSCULAR | Status: DC | PRN
  Administered 2013-12-08: 25 ug via INTRAVENOUS

## 2013-12-08 MED ORDER — INSULIN GLARGINE 100 UNIT/ML SOLOSTAR PEN: 10.0000 [IU] | PEN_INJECTOR | Freq: Two times a day (BID) | SUBCUTANEOUS | Status: DC

## 2013-12-08 MED ORDER — HYDRALAZINE HCL 20 MG/ML IJ SOLN
10.0000 mg | Freq: Four times a day (QID) | INTRAMUSCULAR | Status: DC | PRN
  Administered 2013-12-08: 10 mg via INTRAVENOUS
  Filled 2013-12-08: qty 1

## 2013-12-08 MED ORDER — FENTANYL CITRATE 0.05 MG/ML IJ SOLN
INTRAMUSCULAR | Status: AC
  Filled 2013-12-08: qty 2

## 2013-12-08 MED ORDER — METOPROLOL TARTRATE 100 MG PO TABS: 100.0000 mg | ORAL_TABLET | Freq: Three times a day (TID) | ORAL | Status: DC

## 2013-12-08 MED ORDER — BUTAMBEN-TETRACAINE-BENZOCAINE 2-2-14 % EX AERO
INHALATION_SPRAY | CUTANEOUS | Status: DC | PRN
  Administered 2013-12-08: 2 via TOPICAL

## 2013-12-08 MED ORDER — SIMVASTATIN 40 MG PO TABS: 40.0000 mg | ORAL_TABLET | Freq: Every day | ORAL | Status: DC

## 2013-12-08 MED ORDER — MIDAZOLAM HCL 10 MG/2ML IJ SOLN
INTRAMUSCULAR | Status: DC | PRN
  Administered 2013-12-08: 1 mg via INTRAVENOUS
  Administered 2013-12-08: 2 mg via INTRAVENOUS

## 2013-12-08 MED ORDER — METOPROLOL TARTRATE 100 MG PO TABS
100.0000 mg | ORAL_TABLET | Freq: Three times a day (TID) | ORAL | Status: DC
  Administered 2013-12-08 (×2): 100 mg via ORAL
  Filled 2013-12-08 (×3): qty 1

## 2013-12-08 MED ORDER — SODIUM CHLORIDE 0.9 % IV SOLN
INTRAVENOUS | Status: DC
  Administered 2013-12-08: 100 mL via INTRAVENOUS

## 2013-12-08 NOTE — Care Management Note (Signed)
    Page 1 of 1   12/08/2013     2:56:23 PM CARE MANAGEMENT NOTE 12/08/2013  Patient:  [name],[name]   Account Number:  [id number]  Date Initiated:  12/08/2013  Documentation initiated by:  Sheyla Zaffino  Subjective/Objective Assessment:   dx CVA    Primary Care @ [facility] and [facility]  CM consult      [facility] Choice  HOME HEALTH   Choice offered to / List presented to:  C-4 Adult Children      [place] arranged  HH-1 RN  [place].   Status of service:  Completed, signed off  Discharge Disposition:  [place]  Per UR Regulation:  Reviewed for med. necessity/level of care/duration of stay  IMPRESSION: 1. The patient has bilateral pulmonary involving branches of the right middle and lower lobe and left lower lobe pulmonary arteries. Positive for acute PE with CT evidence of right heartstrain (RV/LV Ratio = 1.5) consistent with at least submassive (intermediate risk)PE Comments:  12/08/13 [facility] MSN BSN CCM Per dtr, [name] [phone]) pt will be staying @ son's home @ [address] [address].  Dtr chose [facility] as pt received services from them previously, liaison notified.

## 2013-12-08 NOTE — Progress Notes (Signed)
Physical Therapy Treatment Patient Details Name: ALIZZON DIOGUARDI MRN: [id number] DOB: [birth year] Today's Date: 12/08/2013    History of Present Illness LESSLIE MOSSA is an 53 y.o. female with left MCA stroke in 05/2013, DM, HTN and schizophrenia, brought to the ED as code stroke following the onset of rt facial droop and confusion with worsening of aphasia beyond baseline residual from previous stroke. CT showed old large left MCA infarct, but no acute changes.NIH stroke score was 12. She was beyond the time window for treatment with tPA. Possible new onset partial seizure disorder was also a consideration. Pt is near her baseline per daughter who is bedside.    PT Comments    Pt's daughter present for session and A with transfer to recliner.  Per daughter pt is requiring a little more A than baseline to transfer to chair.  Per daughter plan is for pt to return to home where family and Aide will continue to care for pt.    Follow Up Recommendations  Home health PT;Supervision/Assistance - 24 hour     Equipment Recommendations  None recommended by PT    Recommendations for Other Services       Precautions / Restrictions Precautions Precautions: Fall Precaution Comments: dense R UE hemiplegia with shoulder subluxation Restrictions Weight Bearing Restrictions: No    Mobility  Bed Mobility Overal bed mobility: Needs Assistance Bed Mobility: Supine to Sit     Supine to sit: Mod assist     General bed mobility comments: pt needs increased time to complete task and needs A bringing hips to EOB.    Transfers Overall transfer level: Needs assistance Equipment used: 1 person hand held assist Transfers: Sit to/from [facility] Sit to Stand: Min assist Stand pivot transfers: Min assist       General transfer comment: Duaghter A with transfer towards L side to recliner and indicates pt requiring increased A as compared to baseline.    Ambulation/Gait                  Stairs            Wheelchair Mobility    Modified Rankin (Stroke Patients Only) Modified Rankin (Stroke Patients Only) Pre-Morbid Rankin Score: Moderately severe disability Modified Rankin: Moderately severe disability     Balance                                    Cognition Arousal/Alertness: Awake/alert Behavior During Therapy: WFL for tasks assessed/performed Overall Cognitive Status: Difficult to assess                      Exercises      General Comments        Pertinent Vitals/Pain Pain Assessment: Faces Faces Pain Scale: Hurts little more Pain Location: R shoulder Pain Intervention(s): Repositioned;Premedicated before session    Home Living                      Prior Function            PT Goals (current goals can now be found in the care plan section) Progress towards PT goals: Progressing toward goals    Frequency  Min 4X/week    PT Plan Discharge plan needs to be updated    Co-evaluation             End of Session  Equipment Utilized During Treatment: Gait belt Activity Tolerance: Patient tolerated treatment well Patient left: in chair;with call bell/phone within reach;with family/visitor present     Time: 1205-1225 PT Time Calculation (min): 20 min  Charges:  $Therapeutic Activity: 8-22 mins                    G CodesCatarina Hartshorn, [place] 12/08/2013, 1:04 PM

## 2013-12-08 NOTE — CV Procedure (Signed)
    Indication - CVA  Normal EF No embolic source identified Normal bubble study - no shunt  Dore Oquin, MD

## 2013-12-08 NOTE — Clinical Social Work Note (Signed)
Patient will discharge to home Anticipated discharge date: 9/21 Family notified: patients daughter aware Transportation by Corey Harold- nurse to call when patient is ready  CSW signing off.  Domenica Reamer, [place] Social Worker [phone]

## 2013-12-08 NOTE — Progress Notes (Signed)
Thank you for consult on [name]. She is well know to rehab from prior stay as well as follow up with Dr. Letta Pate in the office. At baseline patient required min assist for transfers and min assist to ambulate short distances (10 feet).  Anticipate that she should progress to baseline by discharge. Will defer consult for now.

## 2013-12-08 NOTE — Discharge Summary (Signed)
Physician Discharge Summary  [name] JFH:[id number] DOB: [birth year] DOA: 12/05/2013  PCP: Chari Manning, NP  Admit date: 12/05/2013 Discharge date: 12/08/2013  Time spent: 35 minutes  Recommendations for Outpatient Follow-up:  1. BP if not controlled needs to have meds uptitrated 2. LAbs cbc bmet in 1-2 weeks  3. Follow Neurology 1 mo  Discharge Diagnoses:  Principal Problem:   Cerebral embolism with cerebral infarction Active Problems:   DM (diabetes mellitus) type II controlled, neurological manifestation   Stroke   Aphasia complicating stroke   Dysphagia   Sinus tachycardia   Discharge Condition: fair  Diet recommendation: dys 3, Carb mod  Filed Weights   12/05/13 1703 12/07/13 0420  Weight: 56.6 kg (124 lb 12.5 oz) 58.2 kg (128 lb 4.9 oz)    History of present illness:  53 y/o ?, htn, dm ty 2, bipolar with prior admits to [facility] for paranoid schizophrenia , admit 05/2013 Multiple areas CVA [R MCA, L thalamus [no TEE 2/2 to hypertension]-followed by PMR Dr. Letta Pate for Persisting R hemiparesis and severe dysphagia and had a PEG tube placed during last hospital stay which was subsequently d/c Re-admitted/18/15 with episodic unresponsiveness, repetitive echolalia, sinus tachycardia   Hospital Course:  1. Worsening of neuro deficits-not stroke or TIA per Dr. Leonie Man -MRI brain does not confirm any acute embolic event. Echo not performed-Carotids no new findings 9/20. EEG does not show seizure activity. Defer addition plavix to ASA 325 to neurology-Cardiology did TEE which was neg for new findings or concerns PFO 2. Global aphasia- continue SLP as OP 3. sinus tachycardia unclear etiology-TSH 1.6. Potentially secondary labetalol withdrawal. Changed Metoprolol 200 mg XL -->metoprolol 100 tid-  Might need OP uptitration 4. Diabetes mellitus, A1c 5.9-blood sugars 160-211. Speech cleared patient for Dys III diet 9/19 5. Right shoulder subluxation-outpatient management  orthopedics   Consultants:  Neurology Cardiology for TEE Procedures:  EEG-neg Tee neg for bubble or PFO Antibiotics:  None   Discharge Exam: Filed Vitals:   12/08/13 1210  BP: 156/96  Pulse: 92  Temp:   Resp:     alert pleasant still echolalia Chest clear s1 s2 no m/r/g   Discharge Instructions You were cared for by a hospitalist during your hospital stay. If you have any questions about your discharge medications or the care you received while you were in the hospital after you are discharged, you can call the unit and asked to speak with the hospitalist on call if the hospitalist that took care of you is not available. Once you are discharged, your primary care physician will handle any further medical issues. Please note that NO REFILLS for any discharge medications will be authorized once you are discharged, as it is imperative that you return to your primary care physician (or establish a relationship with a primary care physician if you do not have one) for your aftercare needs so that they can reassess your need for medications and monitor your lab values.  Discharge Instructions   Diet - low sodium heart healthy    Complete by:  As directed      Discharge instructions    Complete by:  As directed   Please note changes in medications Follow up with primary doc You might need an increase in blood pressure medicines as OP     Increase activity slowly    Complete by:  As directed           Current Discharge Medication List    START taking these  medications   Details  metoprolol (LOPRESSOR) 100 MG tablet Take 1 tablet (100 mg total) by mouth 3 (three) times daily. Qty: 90 tablet, Refills: 0      CONTINUE these medications which have CHANGED   Details  Insulin Glargine (LANTUS SOLOSTAR) 100 UNIT/ML Solostar Pen Inject 10 Units into the skin 2 (two) times daily. With breakfast and supper Qty: 15 pen    simvastatin (ZOCOR) 40 MG tablet Place 1 tablet (40 mg  total) into feeding tube daily at 6 PM. Qty: 30 tablet, Refills: 1      CONTINUE these medications which have NOT CHANGED   Details  acetaminophen (TYLENOL) 325 MG tablet Take 1-2 tablets (325-650 mg total) by mouth every 4 (four) hours as needed for mild pain (available over the counter).    amLODipine (NORVASC) 10 MG tablet Take 10 mg by mouth daily. For blood pressure    aspirin 325 MG tablet Place 1 tablet (325 mg total) into feeding tube daily. Qty: 30 tablet, Refills: 1    baclofen (LIORESAL) 10 MG tablet Take 1 tablet (10 mg total) by mouth 2 (two) times daily. To help with tone/spasms Qty: 60 tablet, Refills: 1   Associated Diagnoses: Spastic hemiplegia affecting dominant side    gabapentin (NEURONTIN) 100 MG capsule Take 1 capsule (100 mg total) by mouth 3 (three) times daily. Qty: 90 capsule, Refills: 1   Associated Diagnoses: DM (diabetes mellitus) type II controlled, neurological manifestation    hydrochlorothiazide (HYDRODIURIL) 25 MG tablet Take 25 mg by mouth daily.    polyethylene glycol powder (GLYCOLAX/MIRALAX) powder Take 17 g by mouth daily. Qty: 527 g, Refills: 2   Associated Diagnoses: Unspecified constipation    potassium chloride SA (K-DUR,KLOR-CON) 20 MEQ tablet Take 1 tablet (20 mEq total) by mouth 2 (two) times daily. Qty: 8 tablet, Refills: 0      STOP taking these medications     labetalol (NORMODYNE) 300 MG tablet        No Known Allergies    The results of significant diagnostics from this hospitalization (including imaging, microbiology, ancillary and laboratory) are listed below for reference.    Significant Diagnostic Studies: Dg Chest 2 View  12/02/2013   CLINICAL DATA:  Hypertension.  Diabetes.  Tachycardia.  EXAM: CHEST  2 VIEW  COMPARISON:  05/27/2013  FINDINGS: The heart size and mediastinal contours are within normal limits. Both lungs are clear. There are degenerative changes in the thoracic spine. The right shoulder appears  inferiorly subluxed.  IMPRESSION: 1.  No evidence for acute cardiopulmonary abnormality. 2. Subluxation or dislocation of the right shoulder.   Electronically Signed   By: Shon Hale M.D.   On: 12/02/2013 19:55   Ct Head Wo Contrast  12/05/2013   CLINICAL DATA:  Code stroke. Right-sided facial droop and slurred speech.  EXAM: CT HEAD WITHOUT CONTRAST  TECHNIQUE: Contiguous axial images were obtained from the base of the skull through the vertex without intravenous contrast.  COMPARISON:  CT head without contrast 10/15/2013.  FINDINGS: The remote left MCA infarct is not significantly changed. This involves left insular, frontal lobe, and temporal lobe. There is ex vacuo dilation of the left lateral ventricle. No acute cortical infarct or definite extension is evident. There is no hemorrhage or mass lesion. Wallerian degeneration of the left cerebral peduncle and brainstem are noted. Mild generalized white matter disease is is stable as well.  The paranasal sinuses and mastoid air cells are clear. The osseous skull is intact.  IMPRESSION: 1. Stable appearance of remote left MCA territory infarct. 2. No acute intracranial abnormality or significant interval change.   Electronically Signed   By: Lawrence Santiago M.D.   On: 12/05/2013 11:07   [name] Contrast  12/05/2013   CLINICAL DATA:  Stroke March 2015, persistent RIGHT-sided hemi paresis and severe dysphagia, rule out recurrent LEFT stroke.  EXAM: MRI HEAD WITHOUT CONTRAST  TECHNIQUE: Multiplanar, multiecho pulse sequences of the brain and surrounding structures were obtained without intravenous contrast.  COMPARISON:  CT of the head December 05, 2013 at 10:59 a.m. and MRI of the head May 27, 2013  FINDINGS: No reduced diffusion to suggest acute ischemia. Trace T2 shine through associated with remote LEFT middle cerebral artery territory infarct (LEFT frontotemporal parietal infarct, with basal ganglia/ corona radiata cystic extension) ; similar non  expansile patchy T2 hyperintense signal within the pons, with LEFT cerebral peduncle progressive volume loss consistent with wallerian degeneration. Patchy susceptibility artifact within LEFT corona radiata and temporal parietal lobe. Superimposed abnormal bright T2 signal within the basal ganglia, unchanged. Patchy T2 bright signal within the supratentorial white matter. Subcentimeter T2 hyperintensities within the LEFT greater than RIGHT thalamus, unchanged. Ex vacuo dilatation LEFT lateral ventricle without hydrocephalus.  No abnormal extra-axial fluid collections. Normal major intracranial vascular flow voids seen at the skull base. Ocular globes and orbital contents are nonsuspicious. This conjugate gaze may be transient. Mild paranasal sinus mucosal thickening without air-fluid levels. No abnormal sellar expansion. No cerebellar tonsillar ectopia. No suspicious calvarial bone marrow signal.  IMPRESSION: No evidence of acute ischemia, specifically no acute LEFT sided stroke.  Remote large LEFT middle cerebral artery territory hemorrhagic infarct.  Similar abnormal signal within the basal ganglia and pons could reflect metabolic disorder, sequelae of possible PRES, less likely pontine/extra pontine myelinolysis, findings are similar.  Remote bilateral thalamus lacunar infarcts. Moderate white matter changes suggest chronic small vessel ischemic disease.   Electronically Signed   By: Elon Alas   On: 12/05/2013 22:06   Mm Digital Diagnostic Unilat R  11/28/2013   CLINICAL DATA:  Recall from screening mammography, right breast calcifications.  EXAM: DIGITAL DIAGNOSTIC RIGHT MAMMOGRAM  COMPARISON:  11/14/2013, 05/05/2011, dating back to 03/13/2007.  ACR Breast Density Category b: There are scattered areas of fibroglandular density.  FINDINGS: Spot magnification CC and mediolateral views of the calcifications in the upper inner right breast, middle 1/3, were obtained. The calcifications which are new when  compared to the prior examinations span approximately 7 mm and are of varying shapes and sizes. These include linear forms.  IMPRESSION: Suspicious 7 mm group of the new calcifications in the upper inner right breast.  RECOMMENDATION: Stereotactic core needle biopsy of the right breast calcifications.  The stereotactic biopsy procedure was discussed in detail with the patient and her daughter. All of their questions were answered. The patient has agreed to proceed. Because of her history of stroke and right hemiparesis, the patient was shown the stereotactic biopsy table and she believes she will be able to go forward with the procedure. This has been scheduled for Wednesday September 16 at 1 o'clock p.m. She was asked to not take her daily aspirin on Monday or Tuesday and she should be able to resume her aspirin therapy on Wednesday after the procedure.  I have discussed the findings and recommendations with the patient and her daughter. Results were also provided in writing at the conclusion of the visit.  BI-RADS CATEGORY  4: Suspicious.   Electronically  Signed   By: Evangeline Dakin M.D.   On: 11/28/2013 13:42   Mm Digital Screening  11/17/2013   CLINICAL DATA:  Screening.  EXAM: DIGITAL SCREENING BILATERAL MAMMOGRAM WITH CAD  COMPARISON:  Previous Exam(s)  ACR Breast Density Category c: The breast tissue is heterogeneously dense, which may obscure small masses.  FINDINGS: In the right breast, calcifications warrant further evaluation with magnified views. In the left breast, no findings suspicious for malignancy. Images were processed with CAD.  IMPRESSION: Further evaluation is suggested for calcifications in the right breast.  RECOMMENDATION: Diagnostic mammogram of the right breast. (Code:FI-R-21M)  The patient will be contacted regarding the findings, and additional imaging will be scheduled.  BI-RADS CATEGORY  0: Incomplete. Need additional imaging evaluation and/or prior mammograms for comparison.    Electronically Signed   By: Lovey Newcomer M.D.   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 This was found to be concordant by Dr. Pamelia Hoit. Pathology was discussed with the patient's daughter [name], by telephone, at the patient's request. She reported that her mother had done well after the biopsy with no change in the large post biopsy hematoma that she acquired. The area is tender to the touch, but without bruising or bleeding at the present time. Post biopsy instructions and care were reviewed and her questions were answered. She was encouraged to call [facility] for any additional concerns. The patient is asked to return for annual screening mammography.  Pathology results reported by Susa Raring RN, BSN on December 04, 2013.   Electronically Signed   By: Pamelia Hoit M.D.   On: 12/04/2013 11:49   12/04/2013   CLINICAL DATA:  53 year old female with suspicious right breast calcifications  EXAM: RIGHT BREAST STEREOTACTIC CORE NEEDLE BIOPSY  COMPARISON:  Previous exams.  FINDINGS: The patient and I discussed the procedure of stereotactic-guided biopsy including benefits and alternatives. We discussed the high likelihood of a successful procedure. We discussed the risks of the procedure including infection, bleeding, tissue injury, clip migration, and inadequate sampling. Informed written consent was given. The usual time out protocol was performed immediately prior to the procedure.  Using sterile technique and 2% Lidocaine as local anesthetic, under stereotactic guidance, a 9 gauge vacuum assist biopsy device was used to perform core needle biopsy of calcifications in the upper, inner quadrant of the right breast using a superior approach. Specimen radiograph was performed showing calcifications within  the specimen. Specimens with calcifications are identified for pathology.  At the conclusion of the procedure, an X shaped tissue marker clip was deployed into the biopsy cavity. Follow-up 2-view mammogram confirmed clip placement. A post biopsy hematoma is evident.  IMPRESSION: Stereotactic-guided biopsy of the right breast.  Electronically Signed: By: Pamelia Hoit M.D. On: 12/03/2013 15:16    Microbiology: Recent Results (from the past 240 hour(s))  MRSA PCR SCREENING     Status: None   Collection Time    12/05/13  5:21 PM      Result Value Ref Range Status   MRSA by PCR NEGATIVE  NEGATIVE Final   Comment:            The GeneXpert MRSA Assay (FDA     approved for NASAL specimens     only), is one component of a  comprehensive MRSA colonization     surveillance program. It is not     intended to diagnose MRSA     infection nor to guide or     monitor treatment for     MRSA infections.     Labs: Basic Metabolic Panel:  Recent Labs Lab 12/02/13 1823 12/05/13 1049 12/05/13 1058 12/06/13 0910 12/07/13 0318  NA 143 142 143 143 140  K 3.1* 3.6* 3.3* 3.5* 3.5*  CL 100 101 102 104 104  CO2 27 28  --  23 26  GLUCOSE 91 140* 140* 97 165*  BUN 11 11 9 8  4*  CREATININE 0.70 0.73 0.80 0.59 0.67  CALCIUM 9.6 9.5  --  8.9 8.4   Liver Function Tests:  Recent Labs Lab 12/05/13 1049  AST 23  ALT 16  ALKPHOS 57  BILITOT 0.4  PROT 7.5  ALBUMIN 3.8   No results found for this basename: LIPASE, AMYLASE,  in the last 168 hours No results found for this basename: AMMONIA,  in the last 168 hours CBC:  Recent Labs Lab 12/02/13 1823 12/05/13 1049 12/05/13 1058 12/06/13 0910  WBC 7.0 7.7  --  6.0  NEUTROABS 3.5 3.5  --  3.6  HGB 13.6 12.4 12.9 11.8*  HCT 39.4 36.5 38.0 34.0*  MCV 85.7 88.2  --  85.6  PLT 333 268  --  274   Cardiac Enzymes: No results found for this basename: CKTOTAL, CKMB, CKMBINDEX, TROPONINI,  in the last 168 hours BNP: BNP (last 3 results) No  results found for this basename: PROBNP,  in the last 8760 hours CBG:  Recent Labs Lab 12/07/13 0916 12/07/13 1229 12/07/13 1633 12/07/13 2152 12/08/13 0753  GLUCAP 168* 149* 101* 173* 142*       Signed:  Nita Sells  [facility] 12/08/2013, 12:18 PM

## 2013-12-08 NOTE — Progress Notes (Signed)
  Echocardiogram Echocardiogram Transesophageal has been performed.  [name] M 12/08/2013, 11:00 AM

## 2013-12-08 NOTE — Interval H&P Note (Signed)
History and Physical Interval Note:  12/08/2013 10:14 AM  [name]  has presented today for surgery, with the diagnosis of stroke  The various methods of treatment have been discussed with the patient and family. After consideration of risks, benefits and other options for treatment, the patient has consented to  Procedure(s): TRANSESOPHAGEAL ECHOCARDIOGRAM (TEE) (N/A) as a surgical intervention .  The patient's history has been reviewed, patient examined, no change in status, stable for surgery.  I have reviewed the patient's chart and labs.  Questions were answered to the patient's satisfaction.     SKAINS, MARK

## 2013-12-08 NOTE — Progress Notes (Signed)
    [facility] has been requested to perform a transesophageal echocardiogram on [facility] for stroke work-up.  After careful review of history and examination, the risks and benefits of transesophageal echocardiogram have been explained including risks of esophageal damage, perforation (1:10,000 risk), bleeding, pharyngeal hematoma as well as other potential complications associated with conscious sedation including aspiration, arrhythmia, respiratory failure and death. Alternatives to treatment were discussed, questions were answered. Patient is willing to proceed.   Tarri Fuller, [facility] 12/08/2013 8:18 AM

## 2013-12-08 NOTE — Clinical Social Work Psychosocial (Signed)
Clinical Social Work Department BRIEF PSYCHOSOCIAL ASSESSMENT 12/08/2013  Patient:  BrendaZuley Compton     Account Number:  [id number]     Admit date:  12/05/2013  Clinical Social Worker:  Domenica Reamer, [place]  Date/Time:  12/08/2013 12:04 PM  Referred by:  Physician  Date Referred:  12/08/2013 Referred for  SNF Placement   Other Referral:   Interview type:  Patient Other interview type:    PSYCHOSOCIAL DATA Living Status:  FAMILY Admitted from facility:   Level of care:   Primary support name:  [name] Primary support relationship to patient:  CHILD, ADULT Degree of support available:   Nurse reported that daughter was at bedside.  Occupational Therapy notes indicated that patient has high level of support at home- children help with care.    CURRENT CONCERNS Current Concerns  Post-Acute Placement   Other Concerns:    SOCIAL WORK ASSESSMENT / PLAN CSW spoke with patient about SNF back-up to CIR.  Patient nodded that she is ok with SNF search in [facility]. CSW will continue to follow   Assessment/plan status:  Psychosocial Support/Ongoing Assessment of Needs Other assessment/ plan:   FL2  PASAR   Information/referral to community resources:   [facility]    PATIENT'S/FAMILY'S RESPONSE TO PLAN OF CARE: Patient is agreeable to SNF.       Domenica Reamer, [place] Social Worker [phone]

## 2013-12-08 NOTE — Evaluation (Signed)
Occupational Therapy Evaluation Patient Details Name: [name] MRN: [id number] DOB: October 02, 1960 Today's Date: 12/08/2013    History of Present Illness [name] is an 53 y.o. female with left MCA stroke in 05/2013, DM, HTN and schizophrenia, brought to the ED as code stroke following the onset of rt facial droop and confusion with worsening of aphasia beyond baseline residual from previous stroke. CT showed old large left MCA infarct, but no acute changes.NIH stroke score was 12. She was beyond the time window for treatment with tPA. Possible new onset partial seizure disorder was also a consideration. Pt is near her baseline per daughter who is bedside.   Clinical Impression   Prior to admission, pt was dependent in most ADL and was ambulating short distances with a hemiwalker and assistance.  She has support of a daily aide and her children and equipment needs at home are met.  Daughter reports that therapy has been minimal since she was discharged from rehab due to her insurance.  Daughter has many questions about safe transfers and R UE positioning and pain. Pt presents with dense R UE hemiplegia with increased flexor tone and shoulder subluxation. Will follow acutely.    Follow Up Recommendations  CIR    Equipment Recommendations  None recommended by OT    Recommendations for Other Services       Precautions / Restrictions Precautions Precautions: Fall Precaution Comments: dense R UE hemiplegia with shoulder subluxation Restrictions Weight Bearing Restrictions: No      Mobility Bed Mobility Overal bed mobility: Needs Assistance Bed Mobility: Supine to Sit;Sit to Supine     Supine to sit: Mod assist Sit to supine: Mod assist      Transfers Overall transfer level:  (did not transfer, pt with TEE soon)     Sit to Stand: Min assist              Balance     Sitting balance-Leahy Scale: Good                                       ADL Overall ADL's : At baseline                                             Vision                     Perception     Praxis      Pertinent Vitals/Pain Pain Assessment: Faces Pain Score: 4  Faces Pain Scale: Hurts little more Pain Location: R shoulder with ROM Pain Intervention(s): Repositioned;Monitored during session (instructed in safe ROM and positioning)     Hand Dominance Right   Extremity/Trunk Assessment Upper Extremity Assessment Upper Extremity Assessment: RUE deficits/detail RUE Deficits / Details: increased flexor tone, brunnstrom level 1 RUE Sensation: decreased light touch;decreased proprioception RUE Coordination: decreased fine motor;decreased gross motor (no functional use)   Lower Extremity Assessment Lower Extremity Assessment: Defer to PT evaluation       Communication Communication Communication: Receptive difficulties;Expressive difficulties   Cognition Arousal/Alertness: Awake/alert Behavior During Therapy: WFL for tasks assessed/performed Overall Cognitive Status: Difficult to assess                     General Comments  Exercises       Shoulder Instructions      Home Living Family/patient expects to be discharged to:: Private residence Living Arrangements: Children Available Help at Discharge: Family;Available PRN/intermittently;Personal care attendant (personal care attendant 4 hours/day) Type of Home: House Home Access: Stairs to enter [employer] of Steps: 5-7 steps to enter son's home Entrance Stairs-Rails: Right;Left Home Layout: One level     Bathroom Shower/Tub: Tub/shower unit;Curtain   Biochemist, clinical: Standard     Home Equipment: Environmental consultant - 2 wheels;[place] - manual;Tub bench;Bedside commode   Additional Comments: Pt's daughter present      Prior Functioning/Environment Level of Independence: Needs assistance  Gait / Transfers Assistance  Needed: amb short distances with assist and mostly unilateral assistive device ADL's / Homemaking Assistance Needed: Can self feed with set up and brush her teeth, otherwise dependent in grooming, all bathing, dressing, and toileting. Communication / Swallowing Assistance Needed: aphasia      OT Diagnosis: Generalized weakness;Hemiplegia dominant side   OT Problem List: Decreased strength;Decreased range of motion;Decreased activity tolerance;Impaired balance (sitting and/or standing);Decreased coordination;Decreased knowledge of precautions;Cardiopulmonary status limiting activity;Impaired UE functional use;Pain   OT Treatment/Interventions: Self-care/ADL training;Neuromuscular education;Patient/family education    OT Goals(Current goals can be found in the care plan section) Acute Rehab OT Goals Patient Stated Goal: Per daughter, pt want further rehab prior to d/c.  OT Goal Formulation: With patient Time For Goal Achievement: 12/22/13 Potential to Achieve Goals: Good ADL Goals Pt Will Perform Grooming: with min assist;sitting Pt Will Transfer to Toilet: with min assist;ambulating;bedside commode Additional ADL Goal #1: Daughter and pt will be independent in R UE positioning in bed and chair to decrease pain and reduce deformity. Additional ADL Goal #2: Daughter and pt will be independent in gentle R UE ROM and perform daily.  OT Frequency: Min 2X/week   Barriers to D/C:            Co-evaluation              End of Session Equipment Utilized During Treatment: Gait belt Nurse Communication:  (NPO for TEE)  Activity Tolerance: Patient tolerated treatment well Patient left: in bed;with call bell/phone within reach;with family/visitor present   Time: 0825-0859 OT Time Calculation (min): 34 min Charges:  OT General Charges $OT Visit: 1 Procedure OT Evaluation $Initial OT Evaluation Tier I: 1 Procedure OT Treatments $Neuromuscular Re-education: 8-22 mins G-Codes:     Malka So 12/08/2013, 9:08 AM [phone]

## 2013-12-08 NOTE — Progress Notes (Signed)
[name] AYT:[id number] DOB: [birth year] DOA: 12/05/2013 PCP: Chari Manning, NP  Brief narrative: 53 y/o ?, htn, dm ty 2, bipolar with prior admits to [facility] for paranoid schizophrenia , admit 05/2013 Multiple areas CVA [R MCA, L thalamus [no TEE 2/2 to hypertension]-followed by PMR Dr. Letta Pate for Persisting R hemiparesis and severe dysphagia and had a PEG tube placed during last hospital stay which was subsequently d/c Re-admitted/18/15 with episodic unresponsiveness, repetitive echolalia, sinus tachycardia  Past medical history-As per Problem list Chart reviewed as below- Reviewed  Consultants:  Neurology  Procedures:  EEG  Antibiotics:  None   Subjective   Echolalia persists No stool since admit but nods head "no" when asked if she wants enema Daughter bedside NPO for Cardiology intervention loop vs TEE   Objective    Interim History:   Telemetry: Sinus tachycardia low 100's   Objective: Filed Vitals:   12/08/13 0200 12/08/13 0230 12/08/13 0300 12/08/13 0756  BP: 145/95 147/90 132/88 157/96  Pulse: 101 96 99 101  Temp:   98.8 F (37.1 C) 98.7 F (37.1 C)  TempSrc:   Oral Oral  Resp: 17 21 21 21   Height:      Weight:      SpO2: 100% 98% 98% 100%    Intake/Output Summary (Last 24 hours) at 12/08/13 0803 Last data filed at 12/08/13 0200  Gross per 24 hour  Intake   1350 ml  Output    700 ml  Net    650 ml    Exam:  General: Alert oriented Cardiovascular: S1-S2 tachycardic Respiratory: Clinically clear Abdomen: Nondistended Skin no lower extremity edema Neuro unchanged exam as per prior-persisting right-sided deficits more so right upper extremity and left, facial drooping on the right side  Data Reviewed: Basic Metabolic Panel:  Recent Labs Lab 12/02/13 1823 12/05/13 1049 12/05/13 1058 12/06/13 0910 12/07/13 0318  NA 143 142 143 143 140  K 3.1* 3.6* 3.3* 3.5* 3.5*  CL 100 101 102 104 104  CO2 27 28  --  23 26  GLUCOSE 91  140* 140* 97 165*  BUN 11 11 9 8  4*  CREATININE 0.70 0.73 0.80 0.59 0.67  CALCIUM 9.6 9.5  --  8.9 8.4   Liver Function Tests:  Recent Labs Lab 12/05/13 1049  AST 23  ALT 16  ALKPHOS 57  BILITOT 0.4  PROT 7.5  ALBUMIN 3.8   No results found for this basename: LIPASE, AMYLASE,  in the last 168 hours No results found for this basename: AMMONIA,  in the last 168 hours CBC:  Recent Labs Lab 12/02/13 1823 12/05/13 1049 12/05/13 1058 12/06/13 0910  WBC 7.0 7.7  --  6.0  NEUTROABS 3.5 3.5  --  3.6  HGB 13.6 12.4 12.9 11.8*  HCT 39.4 36.5 38.0 34.0*  MCV 85.7 88.2  --  85.6  PLT 333 268  --  274   Cardiac Enzymes: No results found for this basename: CKTOTAL, CKMB, CKMBINDEX, TROPONINI,  in the last 168 hours BNP: No components found with this basename: POCBNP,  CBG:  Recent Labs Lab 12/06/13 2234 12/07/13 0916 12/07/13 1229 12/07/13 1633 12/07/13 2152  GLUCAP 211* 168* 149* 101* 173*    Recent Results (from the past 240 hour(s))  MRSA PCR SCREENING     Status: None   Collection Time    12/05/13  5:21 PM      Result Value Ref Range Status   MRSA by PCR NEGATIVE  NEGATIVE Final  Comment:            The GeneXpert MRSA Assay (FDA     approved for NASAL specimens     only), is one component of a     comprehensive MRSA colonization     surveillance program. It is not     intended to diagnose MRSA     infection nor to guide or     monitor treatment for     MRSA infections.     Studies:              All Imaging reviewed and is as per above notation   Scheduled Meds: .  stroke: mapping our early stages of recovery book   Does not apply Once  . aspirin  325 mg Oral Daily  . enoxaparin (LOVENOX) injection  40 mg Subcutaneous Q24H  . insulin glargine  10 Units Subcutaneous BID  . metoprolol tartrate  100 mg Oral TID  . simvastatin  40 mg Oral q1800   Continuous Infusions: . dextrose 75 mL/hr at 12/08/13 0200     Assessment/Plan: 1.  Probable TIA-MRI  brain does not confirm any acute embolic event. Echo not performed-Carotids no new findings 9/20.  EEG does not show seizure activity.  Defer addition plavix to ASA 325 to neurology-Cardiology to see for TEE/Loop 2. Global aphasia- continue SLP.  3. sinus tachycardia unclear etiology-TSH 1.6. Potentially secondary labetalol withdrawal.  Changed Metoprolol 200 mg XL -->metoprolol 100 tid 4. Diabetes mellitus, A1c 5.9-blood sugars 160-211. Speech cleared patient for Dys III diet 9/19 5. Right shoulder subluxation-outpatient management orthopedics  Code Status:  Full Family Communication: [place], [place] Daughter [phone] with her at bedside 9/21 Disposition Plan: Inpatient SDU-can transfer totele.  MIght need CIR in next 1-2 days after work-up   Verneita Griffes, MD  [facility] Pager [phone] 12/08/2013, 8:03 AM    LOS: 3 days

## 2013-12-08 NOTE — Progress Notes (Signed)
Received prescreen request for possible inpatient rehab and noted that rehab consult order has already been placed. We will await completion of rehab consult and an admission coordinator will follow up at that time.   Thank you.  Nanetta Batty, PT Rehabilitation Admissions Coordinator [phone]

## 2013-12-08 NOTE — Progress Notes (Signed)
STROKE TEAM PROGRESS NOTE   HISTORY:   [name] is an 53 y.o. female with left MCA stroke in 05/2013, DM, HTN and schizophrenia, brought to the ED as code stroke following the onset of rt facial droop and confusion with worsening of aphasia beyond baseline residual from previous stroke. CT showed old large left MCA infarct, but no acute changes.NIH stroke score was 12. She was beyond the time window for treatment with tPA. Possible new onset partial seizure disorder was also a consideration. She had no response to Ativan 1 mg i.v. EEG and MRI are pending.  LSN: 0800 on 12/05/2013.  tPA Given: No: beyond time window  MRankin: 3  SUBJECTIVE (INTERVAL HISTORY) Daughter is at the bedside. Patient is back to baseline.  TEE done this am shows no clot or PFO OBJECTIVE Temp:  [98.2 F (36.8 C)-98.9 F (37.2 C)] 98.2 F (36.8 C) (09/21 1127) Pulse Rate:  [77-110] 92 (09/21 1210) Cardiac Rhythm:  [-] Normal sinus rhythm (09/21 0600) Resp:  [16-27] 16 (09/21 1127) BP: (123-185)/(80-123) 156/96 mmHg (09/21 1210) SpO2:  [98 %-100 %] 100 % (09/21 1127)   Recent Labs Lab 12/07/13 1229 12/07/13 1633 12/07/13 2152 12/08/13 0753 12/08/13 1138  GLUCAP 149* 101* 173* 142* 116*    Recent Labs Lab 12/02/13 1823 12/05/13 1049 12/05/13 1058 12/06/13 0910 12/07/13 0318  NA 143 142 143 143 140  K 3.1* 3.6* 3.3* 3.5* 3.5*  CL 100 101 102 104 104  CO2 27 28  --  23 26  GLUCOSE 91 140* 140* 97 165*  BUN 11 11 9 8  4*  CREATININE 0.70 0.73 0.80 0.59 0.67  CALCIUM 9.6 9.5  --  8.9 8.4    Recent Labs Lab 12/05/13 1049  AST 23  ALT 16  ALKPHOS 57  BILITOT 0.4  PROT 7.5  ALBUMIN 3.8    Recent Labs Lab 12/02/13 1823 12/05/13 1049 12/05/13 1058 12/06/13 0910  WBC 7.0 7.7  --  6.0  NEUTROABS 3.5 3.5  --  3.6  HGB 13.6 12.4 12.9 11.8*  HCT 39.4 36.5 38.0 34.0*  MCV 85.7 88.2  --  85.6  PLT 333 268  --  274   No results found for this basename: CKTOTAL, CKMB, CKMBINDEX,  TROPONINI,  in the last 168 hours No results found for this basename: LABPROT, INR,  in the last 72 hours No results found for this basename: COLORURINE, APPERANCEUR, LABSPEC, PHURINE, GLUCOSEU, HGBUR, BILIRUBINUR, KETONESUR, PROTEINUR, UROBILINOGEN, NITRITE, LEUKOCYTESUR,  in the last 72 hours     Component Value Date/Time   CHOL 105 12/06/2013 0345   TRIG 66 12/06/2013 0345   HDL 42 12/06/2013 0345   CHOLHDL 2.5 12/06/2013 0345   VLDL 13 12/06/2013 0345   LDLCALC 50 12/06/2013 0345   Lab Results  Component Value Date   HGBA1C 5.9* 12/05/2013      Component Value Date/Time   LABOPIA NONE DETECTED 12/05/2013 2221   COCAINSCRNUR NONE DETECTED 12/05/2013 2221   LABBENZ NONE DETECTED 12/05/2013 2221   AMPHETMU NONE DETECTED 12/05/2013 2221   THCU NONE DETECTED 12/05/2013 2221   LABBARB NONE DETECTED 12/05/2013 2221     Recent Labs Lab 12/05/13 1049  ETH <11    Ct Head Wo Contrast  12/05/2013    IMPRESSION: 1. Stable appearance of remote left MCA territory infarct. 2. No acute intracranial abnormality or significant interval change.    [name] Contrast  12/05/2013     IMPRESSION: No evidence of acute  ischemia, specifically no acute LEFT sided stroke.  Remote large LEFT middle cerebral artery territory hemorrhagic infarct.  Similar abnormal signal within the basal ganglia and pons could reflect metabolic disorder, sequelae of possible PRES, less likely pontine/extra pontine myelinolysis, findings are similar.  Remote bilateral thalamus lacunar infarcts. Moderate white matter changes suggest chronic small vessel ischemic disease.   Electronically Signed   By: Elon Alas   On: 12/05/2013 22:06     PHYSICAL EXAM  Physical exam: Exam: Gen: frail middle aged african american lady Eyes: anicteric sclerae, moist conjunctivae HENT: Atraumatic, oropharynx clear Neck: Trachea midline; supple,                   CV: tachycardic Abdomen: Soft, non-tender;  Extremities: No peripheral  edema   Neuro:  Awake alert interactive  Speech/cognition:    Global aphasia. Follows commands, nods to questions. And speaks single words only Cranial Nerves:    The pupils are equal, round, and reactive to light. Extraocular movements are intact. Conjugate gaze. Blinks to threat in all quadrants.  Right facial droop. The tongue deviates to the right.   Motor Observation:    No asymmetry, no atrophy, and no involuntary movements noted. Tone:    Increased right arm > right leg   Strength: No movement of right arm. Right leg anti-gravity. Left arm and leg spontaneous movement with anti-gravity.      Light Touch:    No withdrawal right arm. Withdraws other extremities.   Reflex Exam:  DTR's:    Brisk right > left Toes:    Rt upgoing, left downgoing  Clonus:    Clonus is absent.   ASSESSMENT/PLAN  [name] is an 53 y.o. female with left MCA stroke in 05/2013, DM, HTN and schizophrenia, brought to the ED as code stroke following the onset of rt facial droop and confusion with worsening of aphasia beyond baseline residual from previous stroke. She did not received IV Tpa as she was out of the tpa window. Exam is significant for global aphasia which is worse than her recent outpatient exam of only expressive aphasia. Right-sided hemiparesis/plegia appears consistent with sequlae from stroke in March 2015. MRI of the brain does not show acute abnormality; remote large LEFT middle cerebral artery territory hemorrhagic infarct.    No acute stroke but with worsening of old stroke sequelae  TEE no cardiac source of embolism  EEG ordered on 9/18. Report unavailable however primary cares's note states EEG does not show seizure activity.   LDL 50, continue zocor necessary as meeting goal of LDL <100  HgbA1c 5.9  Continue ASA   Hypertension   Home meds: resume when possibke  SBP goal <130/90  Hyperlipidemia  Home meds resumed when possible  LDL goal < 100 (<70 for  diabetics)  Diabetes  HgbA1c 5.9  Controlled  Goal < 7.0  Other Stroke Risk Factors Advanced age   Hx stroke/TIA  Personally reviewed brain imaging.and TEE.D/w Dr Verlon Au and daughter. Stroke team will sign off. Call for questions   Antony Contras, MD Stroke Neurology   To contact Stroke Continuity provider, please refer to [email or web address] After hours, contact General Neurology

## 2013-12-09 ENCOUNTER — Encounter (HOSPITAL_COMMUNITY): Payer: Self-pay | Admitting: Cardiology

## 2013-12-10 ENCOUNTER — Telehealth: Payer: Self-pay | Admitting: Internal Medicine

## 2013-12-10 NOTE — Telephone Encounter (Signed)
Patient's Case Manager called requesting ICD-9 prescription to be sent to [facility] soon. She mentioned she left previous message on Sept. 16. Please f/u with Case Manager Keisha at [phone]

## 2013-12-15 ENCOUNTER — Telehealth: Payer: Self-pay | Admitting: Internal Medicine

## 2013-12-15 ENCOUNTER — Other Ambulatory Visit: Payer: Self-pay | Admitting: Emergency Medicine

## 2013-12-15 MED ORDER — POTASSIUM CHLORIDE CRYS ER 20 MEQ PO TBCR: 20.0000 meq | EXTENDED_RELEASE_TABLET | Freq: Two times a day (BID) | ORAL | Status: DC

## 2013-12-15 NOTE — Telephone Encounter (Signed)
[name], patients case manager calling to request update to ICD -9 code. The ICD-9 code needed is 788.3 or 788.31. Please update and add the correct ICD-9 code to forms and fax them back over for completion. Laura's fax # is [phone]. Multiple attempts has been made to gather this information. Please follow up and assist. Also, [name] states patient was in ED and was prescribed potassium chloride SA (K-DUR,KLOR-CON) 20 MEQ tablet [[id number]]. [name] states that this medication has yet to be delivered by [facility]. Please follow up and assist.

## 2013-12-15 NOTE — Telephone Encounter (Signed)
[name], patients case manager calling to request update to ICD -9 code. The ICD-9 code needed is 788.3 or 788.31. Please update and add the correct ICD-9 code to forms and fax them back over for completion. Laura's fax # is [phone]. Multiple attempts has been made to gather this information. Please follow up and assist. Also, [name] states patient was in ED and was prescribed potassium chloride SA (K-DUR,KLOR-CON) 20 MEQ tablet [[id number]]. [name] states that this medication has yet to be delivered by [facility]. Please follow up and assist.    Please f/u

## 2013-12-15 NOTE — Telephone Encounter (Signed)
Did she have certain forms that need to be filled out? Please leave on my desk and I will take care of it tomorrow. You may resend her a order of potassium to [facility] for delivery. Thanks

## 2013-12-16 ENCOUNTER — Telehealth: Payer: Self-pay | Admitting: Internal Medicine

## 2013-12-16 NOTE — Telephone Encounter (Signed)
Rod Holler from [facility] calling to report that the number of [facility] approved visits have been used up for occupational therapy and they are unable to see patient for any addional occupational therapy appointments.  The same will probably happen to speech therapy because the allotted approved visits for the year have already been used.

## 2013-12-16 NOTE — Telephone Encounter (Signed)
[name] from [facility] calling to report that the number of [facility] approved visits have been used up for occupational therapy and they are unable to see patient for any addional occupational therapy appointments. The same will probably happen to speech therapy because the allotted approved visits for the year have already been used.    FYI

## 2013-12-18 ENCOUNTER — Telehealth: Payer: Self-pay | Admitting: Internal Medicine

## 2013-12-18 ENCOUNTER — Other Ambulatory Visit: Payer: Self-pay | Admitting: *Deleted

## 2013-12-18 MED ORDER — POTASSIUM CHLORIDE CRYS ER 20 MEQ PO TBCR: 20.0000 meq | EXTENDED_RELEASE_TABLET | Freq: Two times a day (BID) | ORAL | Status: DC

## 2013-12-18 NOTE — Telephone Encounter (Signed)
Keisha from [facility] calling to f/u up on Potassium script, [facility] waiting for faxed refill request. Please f/u with [facility]

## 2013-12-18 NOTE — Telephone Encounter (Signed)
I faxed medication to [facility].

## 2013-12-22 ENCOUNTER — Encounter: Payer: Medicaid Other | Attending: Physical Medicine & Rehabilitation

## 2013-12-22 ENCOUNTER — Ambulatory Visit (HOSPITAL_BASED_OUTPATIENT_CLINIC_OR_DEPARTMENT_OTHER): Payer: Medicaid Other | Admitting: Physical Medicine & Rehabilitation

## 2013-12-22 ENCOUNTER — Encounter: Payer: Self-pay | Admitting: Physical Medicine & Rehabilitation

## 2013-12-22 VITALS — BP 146/88 | HR 78 | Resp 16 | Ht 65.0 in | Wt 132.0 lb

## 2013-12-22 DIAGNOSIS — G8110 Spastic hemiplegia affecting unspecified side: Secondary | ICD-10-CM | POA: Insufficient documentation

## 2013-12-22 NOTE — Progress Notes (Signed)
Patient was scheduled for Botox today. She is unwilling to undergo procedure today. We discussed the procedure and the rationale. She just says she is scared shakes her head. She is a phasic. Daughter who is with her states that she had a "bad experience" with a prior injection  Examination general no acute distress Mood and affect without lability Speech with aphasia, yes no nods  Motor strength is 0 in the right upper and right lower extremity Ashworth scale 3 at the wrist and finger flexors, Ashworth 3 in the hamstring Ashworth 1 in the gastroc  Impression 1. Right spastic hemiplegia  2.  Left MCA infarct with aphasia  Followup in 3 months Patient will call if she reconsider his Botox injection

## 2013-12-22 NOTE — Progress Notes (Signed)
   Subjective:    Patient ID: [name], female    DOB: [birth year], 53 y.o.   MRN: [id number]  HPI    Review of Systems     Objective:   Physical Exam        Assessment & Plan:

## 2013-12-24 ENCOUNTER — Ambulatory Visit: Payer: Medicaid Other | Attending: Internal Medicine | Admitting: Internal Medicine

## 2013-12-24 ENCOUNTER — Encounter: Payer: Self-pay | Admitting: Internal Medicine

## 2013-12-24 VITALS — BP 137/85 | HR 67 | Temp 97.9°F | Resp 14

## 2013-12-24 DIAGNOSIS — E118 Type 2 diabetes mellitus with unspecified complications: Secondary | ICD-10-CM | POA: Insufficient documentation

## 2013-12-24 DIAGNOSIS — I69320 Aphasia following cerebral infarction: Secondary | ICD-10-CM | POA: Diagnosis not present

## 2013-12-24 DIAGNOSIS — G8110 Spastic hemiplegia affecting unspecified side: Secondary | ICD-10-CM | POA: Diagnosis not present

## 2013-12-24 DIAGNOSIS — M7989 Other specified soft tissue disorders: Secondary | ICD-10-CM | POA: Diagnosis not present

## 2013-12-24 DIAGNOSIS — Z794 Long term (current) use of insulin: Secondary | ICD-10-CM | POA: Diagnosis not present

## 2013-12-24 DIAGNOSIS — I10 Essential (primary) hypertension: Secondary | ICD-10-CM | POA: Insufficient documentation

## 2013-12-24 DIAGNOSIS — I69351 Hemiplegia and hemiparesis following cerebral infarction affecting right dominant side: Secondary | ICD-10-CM | POA: Diagnosis not present
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
## 2013-12-24 NOTE — Progress Notes (Signed)
Pt is here following up on her HTN, diabetes and her stroke.

## 2013-12-24 NOTE — Patient Instructions (Addendum)
Stroke Prevention Some medical conditions and behaviors are associated with an increased chance of having a stroke. You may prevent a stroke by making healthy choices and managing medical conditions. HOW CAN I REDUCE MY RISK OF HAVING A STROKE?   Stay physically active. Get at least 30 minutes of activity on most or all days.  Do not smoke. It may also be helpful to avoid exposure to secondhand smoke.  Limit alcohol use. Moderate alcohol use is considered to be:  No more than 2 drinks per day for men.  No more than 1 drink per day for nonpregnant women.  Eat healthy foods. This involves:  Eating 5 or more servings of fruits and vegetables a day.  Making dietary changes that address high blood pressure (hypertension), high cholesterol, diabetes, or obesity.  Manage your cholesterol levels.  Making food choices that are high in fiber and low in saturated fat, trans fat, and cholesterol may control cholesterol levels.  Take any prescribed medicines to control cholesterol as directed by your health care provider.  Manage your diabetes.  Controlling your carbohydrate and sugar intake is recommended to manage diabetes.  Take any prescribed medicines to control diabetes as directed by your health care provider.  Control your hypertension.  Making food choices that are low in salt (sodium), saturated fat, trans fat, and cholesterol is recommended to manage hypertension.  Take any prescribed medicines to control hypertension as directed by your health care provider.  Maintain a healthy weight.  Reducing calorie intake and making food choices that are low in sodium, saturated fat, trans fat, and cholesterol are recommended to manage weight.  Stop drug abuse.  Avoid taking birth control pills.  Talk to your health care provider about the risks of taking birth control pills if you are over 35 years old, smoke, get migraines, or have ever had a blood clot.  Get evaluated for sleep  disorders (sleep apnea).  Talk to your health care provider about getting a sleep evaluation if you snore a lot or have excessive sleepiness.  Take medicines only as directed by your health care provider.  For some people, aspirin or blood thinners (anticoagulants) are helpful in reducing the risk of forming abnormal blood clots that can lead to stroke. If you have the irregular heart rhythm of atrial fibrillation, you should be on a blood thinner unless there is a good reason you cannot take them.  Understand all your medicine instructions.  Make sure that other conditions (such as anemia or atherosclerosis) are addressed. SEEK IMMEDIATE MEDICAL CARE IF:   You have sudden weakness or numbness of the face, arm, or leg, especially on one side of the body.  Your face or eyelid droops to one side.  You have sudden confusion.  You have trouble speaking (aphasia) or understanding.  You have sudden trouble seeing in one or both eyes.  You have sudden trouble walking.  You have dizziness.  You have a loss of balance or coordination.  You have a sudden, severe headache with no known cause.  You have new chest pain or an irregular heartbeat. Any of these symptoms may represent a serious problem that is an emergency. Do not wait to see if the symptoms will go away. Get medical help at once. Call your local emergency services (911 in [place] Do not drive yourself to the hospital. Document Released: 04/13/2004 Document Revised: 07/21/2013 Document Reviewed: 09/06/2012 Exit[employer] Patient Information 2015 [employer]. This information is not intended to replace advice given   to you by your health care provider. Make sure you discuss any questions you have with your health care provider.  

## 2013-12-28 DIAGNOSIS — F200 Paranoid schizophrenia: Secondary | ICD-10-CM | POA: Insufficient documentation

## 2013-12-29 ENCOUNTER — Telehealth: Payer: Self-pay | Admitting: Surgery

## 2013-12-29 NOTE — Telephone Encounter (Signed)
CM contacted patient and daughter concerning community resources for transitional housing. Recommended that they contact the [facility] [phone], [facility], and [place] program to assist with transitional housing. Provided addresses and phone numbers to agencies. Patient and daughter verbalized understanding, and appreciation for assistance.

## 2013-12-30 ENCOUNTER — Telehealth: Payer: Self-pay | Admitting: *Deleted

## 2013-12-30 NOTE — Telephone Encounter (Signed)
Daughter given information as directed below.   Lance Bosch, NP             Please call her daughter and tell her that her neurologist wants her to stay on aspirin only and they want her to make a appointment within a month. Thanks

## 2013-12-30 NOTE — Telephone Encounter (Signed)
Left message for [name] at [facility] to return call. Want to ensure that ICD-9 codes have been updated as requested.    Monique R Cobb at 12/15/2013 10:09 AM             Varney Biles, patients case manager calling to request update to ICD -9 code. The ICD-9 code needed is 788.3 or 788.31. Please update and add the correct ICD-9 code to forms and fax them back over for completion. Laura's fax # is [phone]. Multiple attempts has been made to gather this information. Please follow up and assist. Also, Varney Biles states patient was in ED and was prescribed potassium chloride SA (K-DUR,KLOR-CON) 20 MEQ tablet [[id number]]. Varney Biles states that this medication has yet to be delivered by [facility]. Please follow up and assist.

## 2013-12-31 ENCOUNTER — Telehealth: Payer: Self-pay | Admitting: Neurology

## 2013-12-31 NOTE — Telephone Encounter (Signed)
Called patient back and scheduled appointment for Monday at 2 pm with Dr Erlinda Hong

## 2013-12-31 NOTE — Telephone Encounter (Signed)
Patient's daughter calling to request sooner appointment for patient, states that her PCP wants her to be seen sooner, and that she has been swollen on her right side. Please return call and advise.

## 2014-01-01 ENCOUNTER — Encounter (HOSPITAL_COMMUNITY): Payer: Medicaid Other

## 2014-01-01 ENCOUNTER — Ambulatory Visit (HOSPITAL_COMMUNITY)
Admission: RE | Admit: 2014-01-01 | Discharge: 2014-01-01 | Disposition: A | Discharge status: Home / Self Care | Payer: Medicaid Other | Source: Ambulatory Visit | Attending: Internal Medicine | Admitting: Internal Medicine

## 2014-01-01 ENCOUNTER — Telehealth: Payer: Self-pay | Admitting: *Deleted

## 2014-01-01 DIAGNOSIS — M7989 Other specified soft tissue disorders: Secondary | ICD-10-CM | POA: Insufficient documentation

## 2014-01-01 DIAGNOSIS — R600 Localized edema: Secondary | ICD-10-CM

## 2014-01-01 DIAGNOSIS — I6340 Cerebral infarction due to embolism of unspecified cerebral artery: Secondary | ICD-10-CM | POA: Insufficient documentation

## 2014-01-01 DIAGNOSIS — I639 Cerebral infarction, unspecified: Secondary | ICD-10-CM

## 2014-01-01 MED ORDER — POTASSIUM CHLORIDE CRYS ER 20 MEQ PO TBCR: 20.0000 meq | EXTENDED_RELEASE_TABLET | Freq: Two times a day (BID) | ORAL | Status: DC

## 2014-01-01 MED ORDER — METOPROLOL TARTRATE 100 MG PO TABS: 100.0000 mg | ORAL_TABLET | Freq: Three times a day (TID) | ORAL | Status: DC

## 2014-01-01 NOTE — Telephone Encounter (Signed)
[facility] Clinical Coordinator called, requested Rx Metoprolol and Potassium to be fax to [facility] in [place] [phone] Rx Fax today

## 2014-01-01 NOTE — Progress Notes (Signed)
Right upper extremity venous duplex completed:  No evidence of DVT or superficial thrombosis.    

## 2014-01-05 ENCOUNTER — Encounter: Payer: Self-pay | Admitting: Neurology

## 2014-01-05 ENCOUNTER — Ambulatory Visit (INDEPENDENT_AMBULATORY_CARE_PROVIDER_SITE_OTHER): Payer: Medicaid Other | Admitting: Neurology

## 2014-01-05 VITALS — BP 141/86 | HR 56

## 2014-01-05 DIAGNOSIS — E1149 Type 2 diabetes mellitus with other diabetic neurological complication: Secondary | ICD-10-CM

## 2014-01-05 DIAGNOSIS — I6340 Cerebral infarction due to embolism of unspecified cerebral artery: Secondary | ICD-10-CM

## 2014-01-05 DIAGNOSIS — Z794 Long term (current) use of insulin: Secondary | ICD-10-CM | POA: Insufficient documentation

## 2014-01-05 DIAGNOSIS — E119 Type 2 diabetes mellitus without complications: Secondary | ICD-10-CM | POA: Insufficient documentation

## 2014-01-05 DIAGNOSIS — I10 Essential (primary) hypertension: Secondary | ICD-10-CM | POA: Insufficient documentation

## 2014-01-05 NOTE — Patient Instructions (Addendum)
-   continue the ASA and zocor for stroke prevention - cotinue to check BP adn glucose at home - follow up with PCP for stroke risk factor modification - will refer you to cardiology for cardiac monitoring (loop recorder) - PT/OT to follow - follow up in 3 months.

## 2014-01-05 NOTE — Progress Notes (Signed)
PATIENT: [name] DOB: 1960-05-29  REASON FOR VISIT: Hospital follow up for stroke HISTORY FROM: daughter and patient  HISTORY OF PRESENT ILLNESS: 53 y.o. [place] female who comes to the office for first hospital follow up post hospital discharge for stroke. She has a history of HTN, DM, bipolar disorder, who was admitted on 05/27/13 with right facial droop and aphasia. 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 Infarcts felt to be embolic due to unknown source and she was placed on ASA for secondary stroke prevention.  Patient had progressive neurologic worsening on 05/29/13 pm with decrease in LOC and right sided weakness. EEG negative for seizures and CCT without significant changes; felt patient with progression of symptoms due to relative hypotension and recommended allowing significantly high permissive hypertension in this patient. Hgb A1c was 14.1 in hospital, now 5.9.  LDL was 130. Hypercoagulable workup negative. 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 No other intracranial flow-limiting stenosis or embolic disease evident. Areas of low density in the left MCA territory consistent with infarction. 2D Echocardiogram with EF of 55-60% with no source of embolus. TEE was not done due to hypotension in hospital. Patient with resultant right hemiparesis and aphasia.  Has had PEG tube removed by radiology in June. Eating well and drinking well. Remains aphasic. Contemplating Botox injections to the wrist and finger flexors, hamstrings, and gastrocnemius by Dr.  Letta Pate. Using Baclofen for spasticity and gabapentin for pain. 25 year old daughter is providing all care.   Interval history During the interval time, she still takes full dose ASA and zocor for stroke prevention. Follows with PCP for BP and DM. RLE muscle strength improved but RUE still flaccid. Remains aphasic with only "Thank you" and "yes/no". She had inpt rehab followed by home PT/OT but then discharged from home PT/OT but no outpt PT/OT initiated yet. Daughter is going to check the status of outpt PT/OT.  REVIEW OF SYSTEMS: Full 14 system review of systems performed and notable only for: eye itching, ringing in ears, runny nose, light sensitivity, eye pain, leg swelling, cold intolerance, excessive thirst, constipation, frequent waking, joint pain, aching muscles, walking difficulty, speech difficulty, weakness, agitation and confusion.  ALLERGIES: No Known Allergies  HOME MEDICATIONS: Outpatient Prescriptions Prior to Visit  Medication Sig Dispense Refill  . acetaminophen (TYLENOL) 325 MG tablet Take 1-2 tablets (325-650 mg total) by mouth every 4 (four) hours as needed for mild pain (available over the counter).      Marland Kitchen amLODipine (NORVASC) 10 MG tablet Take 10 mg by mouth daily. For blood pressure      . aspirin 325 MG tablet Place 1 tablet (325 mg total) into feeding tube daily.  30 tablet  1  . baclofen (LIORESAL) 10 MG tablet Take 1 tablet (10 mg total) by mouth 2 (two) times daily. To help with tone/spasms  60 tablet  1  . gabapentin (NEURONTIN) 100 MG capsule Take 1 capsule (100 mg total) by mouth 3 (three) times daily.  90 capsule  1  . hydrochlorothiazide (HYDRODIURIL) 25 MG tablet Take 25 mg by mouth daily.      Marland Kitchen  Insulin Glargine (LANTUS SOLOSTAR) 100 UNIT/ML Solostar Pen Inject 10 Units into the skin 2 (two) times daily. With breakfast and supper  15 pen    . metoprolol (LOPRESSOR) 100 MG tablet Take 1 tablet (100 mg total) by mouth 3 (three) times daily.  90 tablet  0  .  polyethylene glycol powder (GLYCOLAX/MIRALAX) powder Take 17 g by mouth daily.  527 g  2  . potassium chloride SA (K-DUR,KLOR-CON) 20 MEQ tablet Take 1 tablet (20 mEq total) by mouth 2 (two) times daily.  8 tablet  0  . simvastatin (ZOCOR) 40 MG tablet Place 1 tablet (40 mg total) into feeding tube daily at 6 PM.  30 tablet  1   No facility-administered medications prior to visit.    PHYSICAL EXAM Filed Vitals:   01/05/14 1417  BP: 141/86  Pulse: 56   Cannot calculate BMI with a height equal to zero. No exam data present  Generalized: Well developed, in no acute distress  Head: normocephalic and atraumatic. Oropharynx benign  Neck: Supple, no carotid bruits  Cardiac: Regular rate rhythm, no murmur   Neurological examination  Mentation: Orientation unable to assess due to aphasia.  Severe expressive aphasia with limited words and no sentences. Able to comprehend simple commands. Unable to name or repeat.  Cranial nerve II-XII: Fundoscopic exam not able to see through. Pupils were equal round reactive to light, extraocular movements were full.  Facial droop on the right, normal on the left. Hearing was intact to finger rubbing bilaterally. Tongue deviation to the left. Head turning and shoulder shrug are asymmetric, impaired on right. Motor: Right upper extremity distal strength 0/5, proximal 3-/5, right lower extremity strength is 3/5. Left upper and lower extremity strength 5/5. Sensory: Sensory testing is intact to soft touch LUE and LLE. Evidence of extinction is noted on the right. Coordination: Cerebellar testing not able to do on the right. Gait and station: Gait is not tested, in wheelchair Reflexes: Deep tendon reflexes are symmetric and decreased bilaterally.   ASSESSMENT: 53 y.o. year old AA female  has a past medical history of Hypertension; Depression, Diabetes mellitus; Bipolar affective disorder, Schizophrenia followed up in clinic for Left MCA distribution infarct from  05/27/13. Imaging confirmed multiple bilateral, most in the R MCA but also R occipital and L thalamus infarcts in setting of malignant hypertension with BP 220/125. Infarcts felt to be embolic secondary to unknown source.  Residual right hemiparesis and severe aphasia. Needs assistance with all ADL's. Vascular risk factors of HTN, DM.  PLAN: - continue ASA and statin for stroke prevention - check BP and glucose at home - follow up with PCP for stroke risk factor modification - will refer for cardiac monitoring to rule out afib - will schedule for TCD with emboli detection - outpt PT/OT - follow up in 3 months.   Rosalin Hawking, MD PhD [facility] [address] [place], [place] [address] [phone]  Orders Placed This Encounter  Procedures  . [place] TCD WITHMONITORING    Standing Status: Future     Number of Occurrences:      Standing Expiration Date: 07/08/2014    Order Specific Question:  Reason for Exam (SYMPTOM  OR DIAGNOSIS REQUIRED)    Answer:  embolic stroke without source    Order Specific Question:  Preferred imaging location?    Answer:  Internal  . Ambulatory referral to Cardiac Electrophysiology    Referral Priority:  Routine    Referral Type:  Consultation    Referral Reason:  Specialty Services Required    Referred to Provider:  Thompson Grayer, MD    Requested Specialty:  Cardiology    Number of Visits Requested:  1     Patient Instructions  - continue the ASA and zocor for stroke prevention - cotinue to check BP adn glucose at home - follow up with PCP for stroke risk factor modification - will refer you to cardiology for cardiac monitoring (loop recorder) - PT/OT to follow - follow up in 3 months.

## 2014-01-06 ENCOUNTER — Other Ambulatory Visit: Payer: Self-pay | Admitting: Neurology

## 2014-01-06 ENCOUNTER — Ambulatory Visit: Payer: Medicaid Other | Admitting: Speech Pathology

## 2014-01-06 ENCOUNTER — Ambulatory Visit: Payer: Medicaid Other

## 2014-01-06 DIAGNOSIS — I639 Cerebral infarction, unspecified: Secondary | ICD-10-CM

## 2014-01-06 NOTE — Progress Notes (Signed)
This encounter was created in error - please disregard.  This encounter was created in error - please disregard.

## 2014-01-08 ENCOUNTER — Telehealth: Payer: Self-pay | Admitting: Neurology

## 2014-01-08 NOTE — Telephone Encounter (Signed)
Hilda Blades with [facility] @ [phone], received referral for patient to see Dr. Rayann Heman with diagnosis of Stroke.  Dr. Rayann Heman does not see stroke patients, unless they need an implant.  Hilda Blades requesting a more specific diagnosis code.  Please call and advise.

## 2014-01-09 ENCOUNTER — Telehealth: Payer: Self-pay | Admitting: *Deleted

## 2014-01-09 NOTE — Telephone Encounter (Signed)
Called Debra back, left voice message to return my call.

## 2014-01-09 NOTE — Telephone Encounter (Signed)
Darlina Guys from [facility] called regardin pt [name] (DOB: 07-May-1960). Asking about [name]'s referral for PT......I located the referral and called [name] back and informed that the referral was sent to Neuro rehab.

## 2014-01-12 ENCOUNTER — Telehealth: Payer: Self-pay | Admitting: Emergency Medicine

## 2014-01-12 ENCOUNTER — Other Ambulatory Visit: Payer: Self-pay | Admitting: Emergency Medicine

## 2014-01-12 MED ORDER — ATORVASTATIN CALCIUM 20 MG PO TABS
20.0000 mg | ORAL_TABLET | Freq: Every day | ORAL | Status: DC
Start: 2014-01-12 — End: 2014-04-02

## 2014-01-12 NOTE — Telephone Encounter (Signed)
Spoke to pt daughter Demetrus, with instructions to have mother discontinue Simvastatin and start new medication Atorvastatin 20 mg tab Medication e-scribed to [facility] pharmacy

## 2014-01-13 ENCOUNTER — Ambulatory Visit: Payer: Medicaid Other | Admitting: Rehabilitative and Restorative Service Providers"

## 2014-01-13 ENCOUNTER — Telehealth: Payer: Self-pay | Admitting: Emergency Medicine

## 2014-01-13 ENCOUNTER — Ambulatory Visit: Payer: Medicaid Other | Admitting: Occupational Therapy

## 2014-01-13 NOTE — Telephone Encounter (Signed)
daughter was informed

## 2014-01-13 NOTE — Telephone Encounter (Signed)
Message copied by Johny Shears on Tue Jan 13, 2014  3:38 PM ------      Message from: Chari Manning A      Created: Sun Jan 11, 2014  9:48 PM       No blood clots found in arm. Keep arm elevated to minimize swelling. ------

## 2014-01-13 NOTE — Telephone Encounter (Signed)
Message copied by Ricci Barker on Tue Jan 13, 2014 11:35 AM ------      Message from: Chari Manning A      Created: Sun Jan 11, 2014  9:48 PM       No blood clots found in arm. Keep arm elevated to minimize swelling. ------

## 2014-01-13 NOTE — Telephone Encounter (Signed)
Pt given results with instructions to elevate arm/call clinic if sx's worsens

## 2014-01-26 ENCOUNTER — Ambulatory Visit: Payer: Medicaid Other | Admitting: Occupational Therapy

## 2014-01-26 ENCOUNTER — Ambulatory Visit: Payer: Medicaid Other | Attending: Physical Medicine & Rehabilitation

## 2014-01-26 ENCOUNTER — Encounter: Payer: Self-pay | Admitting: *Deleted

## 2014-01-26 ENCOUNTER — Ambulatory Visit: Payer: Medicaid Other | Admitting: Physical Therapy

## 2014-01-26 ENCOUNTER — Encounter: Payer: Self-pay | Admitting: Physical Therapy

## 2014-01-26 ENCOUNTER — Telehealth: Payer: Self-pay | Admitting: Radiology

## 2014-01-26 DIAGNOSIS — IMO0002 Reserved for concepts with insufficient information to code with codable children: Secondary | ICD-10-CM

## 2014-01-26 DIAGNOSIS — R262 Difficulty in walking, not elsewhere classified: Secondary | ICD-10-CM | POA: Insufficient documentation

## 2014-01-26 DIAGNOSIS — I69351 Hemiplegia and hemiparesis following cerebral infarction affecting right dominant side: Secondary | ICD-10-CM | POA: Insufficient documentation

## 2014-01-26 DIAGNOSIS — R269 Unspecified abnormalities of gait and mobility: Secondary | ICD-10-CM

## 2014-01-26 DIAGNOSIS — R279 Unspecified lack of coordination: Secondary | ICD-10-CM | POA: Diagnosis not present

## 2014-01-26 DIAGNOSIS — I639 Cerebral infarction, unspecified: Secondary | ICD-10-CM

## 2014-01-26 DIAGNOSIS — R4702 Dysphasia: Secondary | ICD-10-CM

## 2014-01-26 DIAGNOSIS — R6889 Other general symptoms and signs: Secondary | ICD-10-CM

## 2014-01-26 NOTE — Therapy (Signed)
Speech Language Pathology Evaluation  Patient Details  Name: [name] MRN: [id number] Date of Birth: Feb 09, 1961  Encounter Date: 01/26/2014      End of Session - 01/26/14 1038    Visit Number 1   Number of Visits 16   Date for SLP Re-Evaluation 03/28/14   Authorization Type Medicaid - waiting auth   SLP Start Time 0935   SLP Stop Time 1015   SLP Time Calculation (min) 40 min   Activity Tolerance Patient tolerated treatment well      Past Medical History  Diagnosis Date  . Hypertension   . Depression   . Diabetes mellitus     ?type (05/29/2013)  . Bipolar affective disorder   . Schizophrenia     [name] 11/28/2007 (05/29/2013)  . Stroke 05/27/2013    Past Surgical History  Procedure Laterality Date  . Cesarean section  ~ 1987  . Tubal ligation    . Tee without cardioversion N/A 12/08/2013    Procedure: TRANSESOPHAGEAL ECHOCARDIOGRAM (TEE);  Surgeon: Candee Furbish, MD;  Location: [facility] ENDOSCOPY;  Service: Cardiovascular;  Laterality: N/A;    There were no vitals taken for this visit.  Visit Diagnosis: Dysphasia - Plan: SLP plan of care cert/re-cert      Subjective Assessment - 01/26/14 0938    Symptoms "yes" Pt with CVA April 2015, worsening neuro symptoms September 2015 but no documentation of new neuro event by MRI   Currently in Pain? No/denies   Multiple Pain Sites No          SLP Evaluation [facility] - 01/26/14 0938    SLP Visit Information   Onset Date April 2015   Medical Diagnosis CVA   Subjective   Subjective Pt  with CVA April 2015, worsening neuro abilities but no new neuro event (CVA, TIA) documented with MRI   Patient/Family Stated Goal better communication   General Information   Mobility Status wheelchair   Prior Functional Status   Cognitive/Linguistic Baseline Baseline deficits   Baseline deficit details decr'd receptive and expresive language skills. ST eval 10-24-13 revealed severe receptive and profound expressive aphasia.   Type of Home  House    Lives With Daughter;Son   Available Support Family   Education --  [employer]   Vocation Full time employment  prior to CVA in April   Cognition   Overall Cognitive Status Difficult to assess   Auditory Comprehension   Overall Auditory Comprehension Impaired at baseline   Yes/No Questions --  Simple/conversational yes/no answered with 20% success   Commands Impaired   EffectiveTechniques Repetition;Slowed speech;Visual/Gestural cues   Overall Auditory Comprehension Comments Severe to profound auditory comprehension deficits   Reading Comprehension   Reading Status Impaired   Word level 76-100% accurate   Sentence Level Not tested   Interfering Components Processing time   Expression   Primary Mode of Expression Nonverbal - gestures   Verbal Expression   Overall Verbal Expression Impaired at baseline  since April 2015   Automatic Speech Counting;Singing  5% success counting, 0% singing   Level of Generative/Spontaneous Verbalization --  none "Thank you" pt's rote phrase   Repetition Impaired   Level of Impairment Word level   Naming Impairment   Responsive 0-25% accurate   Interfering Components --  severity of deficit   Non-Verbal Means of Communication Gestures;Communication board  body outline encouraged for pain   Other Verbal Expression Comments profound expressive language deficit   Written Expression   Dominant Hand Right  Written Expression Exceptions to [facility]  unable to copy name            Education - 01/26/14 1037    Education Details Patient;Child(ren)   Methods Explanation;Demonstration   Comprehension Verbalized understanding          SLP Short Term Goals - 01/26/14 1040    SLP SHORT TERM GOAL #1   Title demo understanding of simple sentences by answer yes/no appropriately with yes/no board 60% over 3 sessions   Baseline 20%   Time 4   Period Weeks   Status New   SLP SHORT TERM GOAL #2   Title pt will follow simple one-step  commands with 30% success and occasional min A   Baseline 0%   Time 4   Period Weeks   Status New          SLP Long Term Goals - 01/26/14 1046    SLP LONG TERM GOAL #1   Title pt/family will verbalize and or demo understanding of CVA education   Baseline none provided   Time 4   Period Weeks   Status New   SLP LONG TERM GOAL #2   Title pt will demo understanding of personal questions/functional conversation with multimodal cues   Baseline approx 25%   Time 8   Period Weeks   Status New   SLP LONG TERM GOAL #3   Title pt will indicate picture (f:4) corresponding to pt need/desire (augmentative communication prep) 75% success with repetition allowed   Baseline not completed   Time 8   Period Weeks   Status New   SLP LONG TERM GOAL #4   Title pt will attempt two modes multimodal communication at home (daughter report)   Baseline one mode   Time 8   Period Weeks   SLP LONG TERM GOAL #5   Title pt will copy her name with 80% success   Baseline copy at 50% succes   Time 8   Period Weeks   Status New          Plan - 01/26/14 1039    Clinical Impression Statement Presents with severe-profound receptive aphasia and profound expressive aphasia   Speech Therapy Frequency min 2x/week   Duration 1 week  8 weeks   Treatment/Interventions Language facilitation;Cueing hierarchy;Multimodal communcation approach;Functional tasks;Patient/family education;Compensatory strategies;SLP instruction and feedback   Potential to Achieve Goals Fair   Potential Considerations Severity of impairments;Financial resources;Ability to learn/carryover information        Problem List Patient Active Problem List   Diagnosis Date Noted  . Essential hypertension 01/05/2014  . Type 2 diabetes mellitus with other diabetic neurological complication 16/12/9602  . Paranoid schizophrenia 12/28/2013  . Cerebral embolism with cerebral infarction 12/05/2013  . Sinus tachycardia 12/05/2013  .  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 Spastic hemiplegia affecting dominant side 09/12/2013  . CVA (cerebral infarction) 06/04/2013  . Dysphagia 06/02/2013  . Benign polycythemia 06/02/2013  . Malignant hypertension 05/28/2013  . DM (diabetes mellitus) type II controlled, neurological manifestation 05/27/2013  . Acute embolic stroke 54/11/8117  . 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 Hypertensive emergency 05/27/2013  . Stroke 05/27/2013  . Aphasia complicating stroke 14/78/2956  [facility] 01/26/2014, 10:58 AM

## 2014-01-26 NOTE — Therapy (Signed)
Physical Therapy Evaluation  Patient Details  Name: [name] MRN: [id number] Date of Birth: November 26, 1960  Encounter Date: 01/26/2014      PT End of Session - 01/26/14 1203    Visit Number 1   Authorization Type Medicaid pending authorization requesting 8 visits over 8 week period   PT Start Time 0759   PT Stop Time 0851   PT Time Calculation (min) 52 min   Equipment Utilized During Treatment Gait belt   Activity Tolerance Patient tolerated treatment well      Past Medical History  Diagnosis Date  . Hypertension   . Depression   . Diabetes mellitus     ?type (05/29/2013)  . Bipolar affective disorder   . Schizophrenia     [name] 11/28/2007 (05/29/2013)  . Stroke 05/27/2013    Past Surgical History  Procedure Laterality Date  . Cesarean section  ~ 1987  . Tubal ligation    . Tee without cardioversion N/A 12/08/2013    Procedure: TRANSESOPHAGEAL ECHOCARDIOGRAM (TEE);  Surgeon: Candee Furbish, MD;  Location: [facility] ENDOSCOPY;  Service: Cardiovascular;  Laterality: N/A;    There were no vitals taken for this visit.  Visit Diagnosis:  Weakness due to cerebrovascular accident - Plan: PT plan of care cert/re-cert  Abnormality of gait - Plan: PT plan of care cert/re-cert  Activity intolerance - Plan: PT plan of care cert/re-cert      Subjective Assessment - 01/26/14 0948    Symptoms No pain or other complaints; daughter reports patient needs assistance with transfers, ambulation, and ADL's   Pertinent History Stroke (05/27/13)   Limitations Standing;Walking   Patient Stated Goals Daughter states she would like the patient to be more independent in mobility.   Currently in Pain? No/denies          [facility] PT Assessment - 01/26/14 0017    Assessment   Next MD Visit 02/03/14   Prior Therapy Yes  PT/OT/SLP home health in May (2015)   Precautions   Precautions Fall   Balance Screen   Has the patient fallen in the past 6 months No   Has the patient had a decrease in  activity level because of a fear of falling?  Yes   Is the patient reluctant to leave their home because of a fear of falling?  Yes   [place] Private residence   [place];Other relatives  Daughter, Son, Daughter in [place], 4 grandkids [phone])   Available Help at Discharge Available 24 hours/day   Type of [place] to enter   Entrance Stairs-Number of Steps 5   Entrance Stairs-Rails Right;Left  can not reach at the same time   [place] One level   [place] - 2 wheels;Cane - quad;Bedside commode;Shower seat;Toilet riser;Wheelchair - Designer, fashion/clothing   Prior Function   Level of Independence Needs assistance with ADLs;Needs assistance with gait;Needs assistance with transfers;Other (comment)  has needed assistance with gait/transfers since 1st stroke   Cognition   Overall Cognitive Status Impaired/Different from baseline   Area of Impairment Memory;Following commands   Memory Comments answered incorrectly how many children she has   Following Commands Follows one step commands consistently;Follows one step commands with increased time   Attention Focused   Focused Attention Appears intact   Behaviors Other (comment)  cooperative   Observation/Other Assessments   Observations hemiparetic on R side   Posture/Postural Control   Posture/Postural Control Postural limitations  Postural Limitations Weight shift left;Forward head   Strength   Overall Strength Comments --  minimal synergistic movement in right hip & knee, no ankle   Left Hip Flexion 5/5   Left Hip ABduction 5/5   Left Hip ADduction --   Left Knee Flexion 5/5   Left Ankle Dorsiflexion 5/5   Bed Mobility   Bed Mobility Sit to Supine;Supine to Sit   Supine to Sit 6: Modified independent (Device/Increase time)  uses left leg to assist right leg movements   Sit to Supine 6: Modified independent (Device/Increase time)  uses her  left leg to assist her right leg onto bed   Transfers   Transfers Stand Pivot Transfers;Sit to Stand;Stand to Sit   Sit to Stand 4: Min guard;With upper extremity assist;With armrests  LUE   Stand to Sit 4: Min guard;With upper extremity assist;With armrests  LUE   Stand Pivot Transfers 4: Min assist;With armrests  For safety and balance   Ambulation/Gait   Ambulation/Gait Yes   Ambulation/Gait Assistance 4: Min assist   Ambulation/Gait Assistance Details increased assistance needed during turns   Ambulation Distance (Feet) 30 Feet  for 79M test, 20' during TUG, 30' with small base quad cane   Assistive device Hemi-walker  has an AFO but does not use it   Gait Pattern Step-to pattern;Right circumduction;Decreased hip/knee flexion - right;Decreased stance time - right;Decreased step length - left;Decreased step length - right;Decreased dorsiflexion - right;Decreased weight shift to right;Right steppage;Right foot flat;Poor foot clearance - right  (R) hemiparetic gait pattern   Gait velocity 0.54 ft/sec   Stairs No   Door Management Not tested (comment)   Ramp Not tested (comment)   Curb Not tested (comment)   Balance   Balance Assessed Yes   Standardized Balance Assessment   Standardized Balance Assessment Berg Balance Test;Timed Up and Go Test   Berg Balance Test   Sit to Stand Needs minimal aid to stand or to stabilize   Standing Unsupported Unable to stand 30 seconds unassisted   Sitting with Back Unsupported but Feet Supported on Floor or Stool Able to sit safely and securely 2 minutes   Stand to Sit Needs assistance to sit   Transfers Needs one person to assist   Standing Unsupported with Eyes Closed Needs help to keep from falling   Standing Ubsupported with Feet Together Needs help to attain position and unable to hold for 15 seconds   From Standing, Reach Forward with Outstretched Arm Reaches forward but needs supervision   From Standing Position, Pick up Object from Floor  Unable to try/needs assist to keep balance   From Standing Position, Turn to Look Behind Over each Shoulder Needs supervision when turning   Turn 360 Degrees Needs assistance while turning   Standing Unsupported, Alternately Place Feet on Step/Stool Needs assistance to keep from falling or unable to try   Standing Unsupported, One Foot in [employer] balance while stepping or standing   Standing on One Leg Unable to try or needs assist to prevent fall   Timed Up and Go Test   Normal TUG (seconds) 69.3  >13.5sec indicates fall risk & >30sec indicates ADL dependen   TUG Comments performed with hemi-walker and min assist during turn; min guard walking in straight line   Functional Gait  Assessment   Gait assessed  --   RLE Tone   Modified Ashworth Scale for Grading Hypertonia RLE Slight increase in muscle tone, manifested by a catch, followed by  minimal resistance throughout the remainder (less than half) of the ROM            Education - 01/26/14 1124    Education provided Yes   Education Details Increasing activity with pacing, decreasing distance walked while increasing frequency to increase endurance with ambulation and mobility only with family assist.   Education Details Patient;Child(ren)   Methods Explanation   Comprehension Verbalized understanding          PT Short Term Goals - 01/26/14 1304    PT SHORT TERM GOAL #1   Title patient with daughter's assist demonstrates understanding of HEP   Baseline patient & daughter are dependent in appropriate HEP to progress strength, ROM & activity tolerance   Time --  4 treatments   Status New   PT SHORT TERM GOAL #2   Title reaches 3" with left UE without UE support with supervision   Baseline reaches 2" with left UE without UE support (right UE paralysis) with minimal assist.   Time --  4 treatments   Status New   PT SHORT TERM GOAL #3   Title ambulates 51' with LRAD with supervision.   Baseline ambulates 28' with  hemiwalker with minimal assist.   Time --  4 treatments   Status New          PT Long Term Goals - 01/26/14 1316    PT LONG TERM GOAL #1   Title daughter verbalizes understanding of CVA risk factors & signs/symptoms.   Baseline daughter & patient are not aware of CVA risk factors & signs /symptoms and due to CVA has higher risk   Time --  8 treatments   Status New   PT LONG TERM GOAL #2   Title Berg Balance >/= 15/56   Baseline Berg Balance 8/56   Time --  8 treatments   Status New   PT LONG TERM GOAL #3   Title ambulates 100' with LRAD with supervision.   Baseline ambulates 43' with hemiwalker with minimal assist   Time --  8 treatments   Status New   PT LONG TERM GOAL #4   Title negotiate ramp, curb & stairs (1 rail) with LRAD with minimal assist from daughter.   Baseline patient is non-ambulatory on ramp, curb due to family not understanding how to safely assist patient   Time --  8 treatments   Status New          Plan - 01/26/14 1258    Clinical Impression Statement Patient tolerated evaluation well. Patient had CVA 05/27/13 with hospitalization 05/27/13-06/04/13 on acute care and 06/04/13-07/08/13 on inpatient rehabilitation unit. Patient was evaluated by OP PT on 10/10/13 but was hospitalized 12/05/13 - 12/08/13 with new onset of CVA symptoms. However the neurologist noted no new CVA per imaging results. PT, OT & speech evaluated 01/26/14 for rehabilitation needs. See attached PT evaluation.   Pt will benefit from skilled therapeutic intervention in order to improve on the following deficits Abnormal gait;Decreased activity tolerance;Decreased balance;Decreased cognition;Decreased endurance;Decreased knowledge of use of DME;Decreased mobility;Decreased safety awareness;Decreased strength;Other (comment)  orthotic use   Rehab Potential Good   Clinical Impairments Affecting Rehab Potential cognition, aphasia,    PT Frequency 1x / week   PT Duration 8 weeks   PT  Treatment/Interventions ADLs/Self Care Home Management;DME Instruction;Gait training;Stair training;Functional mobility training;Therapeutic activities;Therapeutic exercise;Balance training;Neuromuscular re-education;Cognitive remediation;Patient/family education;Other (comment)  orthotic training   PT Next Visit Plan HEP, assess gait with & without AFO   PT  Home Exercise Plan bed - heel slides, abduction, bridge, hamstring stretch, heelcord stretch   Recommended Other Services [facility] referral   Consulted and Agree with Plan of Care Patient;Family member/caregiver   Family Member Consulted daughter        Problem List Patient Active Problem List   Diagnosis Date Noted  . Essential hypertension 01/05/2014  . Type 2 diabetes mellitus with other diabetic neurological complication 16/12/9602  . Paranoid schizophrenia 12/28/2013  . Cerebral embolism with cerebral infarction 12/05/2013  . Sinus tachycardia 12/05/2013  . 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 Spastic hemiplegia affecting dominant side 09/12/2013  . CVA (cerebral infarction) 06/04/2013  . Dysphagia 06/02/2013  . Benign polycythemia 06/02/2013  . Malignant hypertension 05/28/2013  . DM (diabetes mellitus) type II controlled, neurological manifestation 05/27/2013  . Acute embolic stroke 54/11/8117  . 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 Hypertensive emergency 05/27/2013  . Stroke 05/27/2013  . Aphasia complicating stroke 14/78/2956                                            Lemarcus Baggerly 01/26/2014, 2:37 PM

## 2014-01-26 NOTE — Therapy (Signed)
Occupational Therapy Evaluation  Patient Details  Name: [name] MRN: [id number] Date of Birth: Jul 28, 1960  Encounter Date: 01/26/2014      OT End of Session - 01/26/14 0938    Visit Number 1   Number of Visits --  1/1 - eval covered   Authorization Type Medicaid: Eval completed, awaiting medicaid approval   OT Start Time 0848   OT Stop Time 0933   OT Time Calculation (min) 45 min   Activity Tolerance Patient tolerated treatment well      Past Medical History  Diagnosis Date  . Hypertension   . Depression   . Diabetes mellitus     ?type (05/29/2013)  . Bipolar affective disorder   . Schizophrenia     [name] 11/28/2007 (05/29/2013)  . Stroke 05/27/2013    Past Surgical History  Procedure Laterality Date  . Cesarean section  ~ 1987  . Tubal ligation    . Tee without cardioversion N/A 12/08/2013    Procedure: TRANSESOPHAGEAL ECHOCARDIOGRAM (TEE);  Surgeon: Candee Furbish, MD;  Location: [facility] ENDOSCOPY;  Service: Cardiovascular;  Laterality: N/A;    There were no vitals taken for this visit.  Visit Diagnosis:  CVA (cerebral vascular accident)      Subjective Assessment - 01/26/14 0858    Symptoms Pt presents with h/o and dx stroke with R sided hertonia/hypotonia, generalized muscle weakness. Right HD, most recent CVA Sept. 2015.   Currently in Pain? No/denies   Multiple Pain Sites No          [facility] OT Assessment - 01/26/14 0859    Home  Environment   Family/patient expects to be discharged to: Private residence   [place]  [address] [place] One level   Bathroom Ambulance person   How accessible Accessible via wheelchair  Raised toilet seat    Lives With Family  Son and his family, daughter assists as well   Prior Function   Level of Independence Needs assistance with ADLs;Needs assistance with homemaking;Needs assistance with gait;Needs assistance with  transfers   ADL   Eating/Feeding Set up   Grooming Minimal assistance   Upper Body Bathing Moderate assistance   Lower Body Bathing Moderate assistance   Upper Body Dressing Moderate assistance   Lower Body Dressing Moderate assistance   Toilet Tranfer Moderate assistance   Toileting - Clothing Manipulation Moderate assistance   Tub/Shower Transfer Moderate assistance   ADL comments --  Pt requires assist for all ADL and self care tasks   Vision - History   Baseline Vision Other (comment)   Visual History --  Pt reports no vision changes, cont to assess   Cognition   Area of Impairment Following commands   Memory Comments --  Answers incorrectly at times, mostly yes/no   Following Commands Follows one step commands consistently;Follows one step commands with increased time   Attention Focused   Focused Attention Appears intact   Behaviors --  Pleasant/cooperatve   Sensation   Light Touch --  Impaired to light touch, hot/cold   Coordination   Gross Motor Movements are Fluid and Coordinated --  Impaired, no AROM, Impaired PROM   Fine Motor Movements are Fluid and Coordinated --  Impaired, no AROM, PROM impaired   Coordination and Movement Description --  No AROM, PROM impaired   9 Hole Peg Test (p) --  Unable, no AROM   AROM  Overall AROM  Unable to assess  No AROM   PROM   Overall PROM  Deficits  Impaired, limited shoulder, elbow, hand/wrist   Overall PROM Comments --  Shoulder flex ~40-45*, ABD 25-30* facial grimmace   Strength   Overall Strength Deficits   Overall Strength Comments --  Generalized weakness, hypotonic RUE            Education - 01/26/14 0938    Education provided Yes   Education Details Patient;Child(ren)   Methods Explanation;Demonstration   Comprehension Verbalized understanding;Need further instruction          OT Short Term Goals - 01/26/14 0947    OT SHORT TERM GOAL #1   Title Pt/family will be I with HEP   Time 4   Period  Weeks   Status New   OT SHORT TERM GOAL #2   Title Pt will demonstrate decreased pain right shoulder flexion based on decreased facial grimmacing and non-verbal indication for R with shoulder at or below 90* to aid in self care tasks.    Time 4   Period Weeks   Status New   OT SHORT TERM GOAL #3   Title Pt will demonstrate decreased pain right shoulder abduction based on decreased facial grimmace/non-verbal indication at or below 50* to aid in self care tasks.   Time 4   Period Weeks   Status New   OT SHORT TERM GOAL #4   Title Pt will be Min A from family with RUE noc splint use, wear and care.    Time 4   Period Weeks   Status New          OT Long Term Goals - 01/26/14 0959    OT LONG TERM GOAL #5   Title Pt will be Min A UE dressing using hemi-techniques due to increased painfree PROM   Baseline Mod A per family/caregiver/daughter report   Time 8   Period Weeks   Status New          Plan - 01/26/14 1011    OT Frequency --  Awaiting Meicaid Authorization/approval for visits   OT Duration 8 weeks        Problem List Patient Active Problem List   Diagnosis Date Noted  . Essential hypertension 01/05/2014  . Type 2 diabetes mellitus with other diabetic neurological complication 46/65/9935  . Paranoid schizophrenia 12/28/2013  . Cerebral embolism with cerebral infarction 12/05/2013  . Sinus tachycardia 12/05/2013  . 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 Spastic hemiplegia affecting dominant side 09/12/2013  . CVA (cerebral infarction) 06/04/2013  . Dysphagia 06/02/2013  . Benign polycythemia 06/02/2013  . Malignant hypertension 05/28/2013  . DM (diabetes mellitus) type II controlled, neurological manifestation 05/27/2013  . Acute embolic stroke 70/17/7939  . 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 Hypertensive emergency 05/27/2013  . Stroke 05/27/2013  . Aphasia  complicating stroke 03/00/9233                                             Percell Miller [facility] 01/26/2014, 10:11 AM

## 2014-01-26 NOTE — Patient Instructions (Signed)
Keep your R arm elevated, bring splint that you wear at night to your next appointment so we can assess how if fits and to see if it needs any adjustments. You have impaired sensation, meaning that you have some feeling inthat right hand, but it isn't clear and its not always accurate. Be sure to double check temperature of water with your left hand or have family do this for you. Keep your right hand elevated and on your lap, be careful not to let it fall to your side and bump into things.

## 2014-01-26 NOTE — Patient Instructions (Signed)
Start with words and three objects - say word, point to word, say word again. Then have Sharlotte complete a sentence with the only option being the word she just said. (e.g., "We listen to music on the ____" SAy it with her if she cannot get the word out by herself, and then repeat the sentence again and say the target word with her again.  Have her practice writing her first name. Use dots to help her trace the letters if needed.  Count paperclips, pencils, dollars, pennies, etc.  Sing common songs with her

## 2014-02-02 ENCOUNTER — Other Ambulatory Visit: Payer: Medicaid Other

## 2014-02-03 ENCOUNTER — Other Ambulatory Visit: Payer: Medicaid Other

## 2014-02-04 ENCOUNTER — Ambulatory Visit (INDEPENDENT_AMBULATORY_CARE_PROVIDER_SITE_OTHER): Payer: Medicaid Other

## 2014-02-04 DIAGNOSIS — I6340 Cerebral infarction due to embolism of unspecified cerebral artery: Secondary | ICD-10-CM

## 2014-02-06 ENCOUNTER — Telehealth: Payer: Self-pay | Admitting: Emergency Medicine

## 2014-02-06 ENCOUNTER — Telehealth: Payer: Self-pay | Admitting: Internal Medicine

## 2014-02-06 ENCOUNTER — Other Ambulatory Visit: Payer: Self-pay | Admitting: Emergency Medicine

## 2014-02-06 MED ORDER — INSULIN PEN NEEDLE 31G X 6 MM MISC: Status: DC

## 2014-02-06 NOTE — Telephone Encounter (Signed)
Patient calling to request needles for lantix pen. [facility]. Please assist.

## 2014-02-06 NOTE — Telephone Encounter (Signed)
Insulin pen needles ordered and e-scribed to [facility]

## 2014-02-18 ENCOUNTER — Other Ambulatory Visit: Payer: Self-pay | Admitting: *Deleted

## 2014-02-19 NOTE — Telephone Encounter (Signed)
Called [facility] - requested they forward Simvastatin and Amlodipine refills to Ms Chari Manning NP  [phone]

## 2014-03-02 ENCOUNTER — Ambulatory Visit: Payer: Medicaid Other | Admitting: Neurology

## 2014-03-03 ENCOUNTER — Telehealth: Payer: Self-pay | Admitting: *Deleted

## 2014-03-03 MED ORDER — INSULIN PEN NEEDLE 31G X 6 MM MISC: Status: DC

## 2014-03-03 NOTE — Telephone Encounter (Signed)
Received message form Cherlyn Cushing at [facility] requesting lantus pen needles be e-scribed to [facility] in [place], [place]. States medicaid will pay for them if sent here. Also requesting letter of medical clearance for dental work. Message sent to PCP

## 2014-03-04 ENCOUNTER — Telehealth: Payer: Self-pay | Admitting: Internal Medicine

## 2014-03-04 NOTE — Telephone Encounter (Signed)
Pt's daughter following up on med refill request for amLODipine (NORVASC) 10 MG tablet and other medications that need to be sent [facility].  Please f/u with pt or pt's daughter.

## 2014-03-11 ENCOUNTER — Other Ambulatory Visit: Payer: Self-pay | Admitting: Emergency Medicine

## 2014-03-11 MED ORDER — AMLODIPINE BESYLATE 10 MG PO TABS: 10.0000 mg | ORAL_TABLET | Freq: Every day | ORAL | Status: DC

## 2014-03-17 ENCOUNTER — Telehealth: Payer: Self-pay | Admitting: Internal Medicine

## 2014-03-17 ENCOUNTER — Emergency Department (HOSPITAL_COMMUNITY): Payer: Medicaid Other

## 2014-03-17 ENCOUNTER — Emergency Department (HOSPITAL_COMMUNITY)
Admission: EM | Admit: 2014-03-17 | Discharge: 2014-03-17 | Disposition: A | Discharge status: Home / Self Care | Payer: Medicaid Other | Attending: Emergency Medicine | Admitting: Emergency Medicine

## 2014-03-17 ENCOUNTER — Encounter (HOSPITAL_COMMUNITY): Payer: Self-pay | Admitting: Cardiology

## 2014-03-17 DIAGNOSIS — R4182 Altered mental status, unspecified: Secondary | ICD-10-CM | POA: Diagnosis not present

## 2014-03-17 DIAGNOSIS — Z8673 Personal history of transient ischemic attack (TIA), and cerebral infarction without residual deficits: Secondary | ICD-10-CM | POA: Diagnosis not present

## 2014-03-17 DIAGNOSIS — I10 Essential (primary) hypertension: Secondary | ICD-10-CM | POA: Diagnosis not present

## 2014-03-17 DIAGNOSIS — E119 Type 2 diabetes mellitus without complications: Secondary | ICD-10-CM | POA: Diagnosis not present

## 2014-03-17 DIAGNOSIS — Z7982 Long term (current) use of aspirin: Secondary | ICD-10-CM | POA: Diagnosis not present

## 2014-03-17 DIAGNOSIS — Z794 Long term (current) use of insulin: Secondary | ICD-10-CM | POA: Diagnosis not present

## 2014-03-17 DIAGNOSIS — Z79899 Other long term (current) drug therapy: Secondary | ICD-10-CM | POA: Insufficient documentation

## 2014-03-17 LAB — URINALYSIS, ROUTINE W REFLEX MICROSCOPIC
Bilirubin Urine: NEGATIVE
GLUCOSE, UA: NEGATIVE mg/dL
Hgb urine dipstick: NEGATIVE
KETONES UR: 15 mg/dL — AB
Nitrite: NEGATIVE
Protein, ur: NEGATIVE mg/dL
Specific Gravity, Urine: 1.008 (ref 1.005–1.030)
Urobilinogen, UA: 1 mg/dL (ref 0.0–1.0)
pH: 6.5 (ref 5.0–8.0)

## 2014-03-17 LAB — CBC
HCT: 41 % (ref 36.0–46.0)
Hemoglobin: 14 g/dL (ref 12.0–15.0)
MCH: 29.4 pg (ref 26.0–34.0)
MCHC: 34.1 g/dL (ref 30.0–36.0)
MCV: 86 fL (ref 78.0–100.0)
PLATELETS: 235 10*3/uL (ref 150–400)
RBC: 4.77 MIL/uL (ref 3.87–5.11)
RDW: 12 % (ref 11.5–15.5)
WBC: 6.1 10*3/uL (ref 4.0–10.5)

## 2014-03-17 LAB — PROTIME-INR
INR: 1.04 (ref 0.00–1.49)
PROTHROMBIN TIME: 13.7 s (ref 11.6–15.2)

## 2014-03-17 LAB — COMPREHENSIVE METABOLIC PANEL
ALBUMIN: 4.1 g/dL (ref 3.5–5.2)
ALT: 19 U/L (ref 0–35)
ANION GAP: 9 (ref 5–15)
AST: 20 U/L (ref 0–37)
Alkaline Phosphatase: 56 U/L (ref 39–117)
BUN: 11 mg/dL (ref 6–23)
CO2: 28 mmol/L (ref 19–32)
CREATININE: 0.82 mg/dL (ref 0.50–1.10)
Calcium: 9.7 mg/dL (ref 8.4–10.5)
Chloride: 103 mEq/L (ref 96–112)
GFR calc Af Amer: >90 mL/min (ref >90)
GFR calc non Af Amer: 81 mL/min — ABNORMAL LOW (ref >90)
Glucose, Bld: 117 mg/dL — ABNORMAL HIGH (ref 70–99)
Potassium: 3.4 mmol/L — ABNORMAL LOW (ref 3.5–5.1)
Sodium: 140 mmol/L (ref 135–145)
TOTAL PROTEIN: 7.6 g/dL (ref 6.0–8.3)
Total Bilirubin: 0.8 mg/dL (ref 0.3–1.2)

## 2014-03-17 LAB — DIFFERENTIAL
BASOS ABS: 0 10*3/uL (ref 0.0–0.1)
BASOS PCT: 1 % (ref 0–1)
EOS ABS: 0.1 10*3/uL (ref 0.0–0.7)
Eosinophils Relative: 2 % (ref 0–5)
LYMPHS ABS: 3.2 10*3/uL (ref 0.7–4.0)
LYMPHS PCT: 52 % — AB (ref 12–46)
MONO ABS: 0.4 10*3/uL (ref 0.1–1.0)
MONOS PCT: 6 % (ref 3–12)
NEUTROS ABS: 2.4 10*3/uL (ref 1.7–7.7)
NEUTROS PCT: 39 % — AB (ref 43–77)

## 2014-03-17 LAB — RAPID URINE DRUG SCREEN, HOSP PERFORMED
AMPHETAMINES: NOT DETECTED
BENZODIAZEPINES: NOT DETECTED
Barbiturates: NOT DETECTED
COCAINE: NOT DETECTED
Opiates: NOT DETECTED
Tetrahydrocannabinol: NOT DETECTED

## 2014-03-17 LAB — I-STAT CHEM 8, ED
BUN: 13 mg/dL (ref 6–23)
CREATININE: 0.8 mg/dL (ref 0.50–1.10)
Calcium, Ion: 1.13 mmol/L (ref 1.12–1.23)
Chloride: 100 mEq/L (ref 96–112)
GLUCOSE: 120 mg/dL — AB (ref 70–99)
HCT: 46 % (ref 36.0–46.0)
HEMOGLOBIN: 15.6 g/dL — AB (ref 12.0–15.0)
Potassium: 3.4 mmol/L — ABNORMAL LOW (ref 3.5–5.1)
Sodium: 142 mmol/L (ref 135–145)
TCO2: 26 mmol/L (ref 0–100)

## 2014-03-17 LAB — URINE MICROSCOPIC-ADD ON

## 2014-03-17 LAB — APTT: aPTT: 28 seconds (ref 24–37)

## 2014-03-17 LAB — I-STAT TROPONIN, ED: Troponin i, poc: 0 ng/mL (ref 0.00–0.08)

## 2014-03-17 LAB — ETHANOL: Alcohol, Ethyl (B): <5 mg/dL (ref 0–9)

## 2014-03-17 NOTE — Telephone Encounter (Signed)
Neoma Laming from [facility] for [facility] called for the patient called to request a letter for the patient to clear her for dental work, please f/u with patient for further questions at [phone].

## 2014-03-17 NOTE — ED Notes (Signed)
Pt to department via EMS- pt woke up from a nap this afternoon and her daughter reports that she was confused. Pt is back at her baseline now. Pt with a hx of stroke about 8 months ago. Bp-160/102 Hr-84 sat-100%. Pt denies any pain.

## 2014-03-17 NOTE — Discharge Instructions (Signed)

## 2014-03-17 NOTE — ED Notes (Signed)
Pt. Left with all belongings 

## 2014-03-17 NOTE — ED Provider Notes (Signed)
CSN: [id number]     Arrival date & time 03/17/14  1837 History   First MD Initiated Contact with Patient 03/17/14 1855     Chief Complaint  Patient presents with  . Cerebrovascular Accident  . Altered Mental Status     (Consider location/radiation/quality/duration/timing/severity/associated sxs/prior Treatment) HPI Patient with history of CVA and right-sided weakness, right facial droop and dysphasia presents with decreased responsiveness and concern for worsening right facial droop. Daughter states patient was in her normal state of health when she took a nap at 4:30 PM. When she woke patient was staring blankly. Daughter was concerned that she may have worsening of her right facial droop. Patient is now at her mental baseline and responding appropriately. The right facial droop is still present daughter states this is her baseline facial droop. Patient has expressive aphasia therefore limiting history. She denies any headache, chest pain, abdominal pain. Past Medical History  Diagnosis Date  . Hypertension   . Depression   . Diabetes mellitus     ?type (05/29/2013)  . Bipolar affective disorder   . Schizophrenia     [name] 11/28/2007 (05/29/2013)  . Stroke 05/27/2013   Past Surgical History  Procedure Laterality Date  . Cesarean section  ~ 1987  . Tubal ligation    . Tee without cardioversion N/A 12/08/2013    Procedure: TRANSESOPHAGEAL ECHOCARDIOGRAM (TEE);  Surgeon: Candee Furbish, MD;  [facility] ENDOSCOPY;  Service: Cardiovascular;  Laterality: N/A;   Family History  Problem Relation Age of Onset  . Hypertension Mother   . Stroke Father   . Hypertension Father   . Diabetes Father    History  Substance Use Topics  . Smoking status: Never Smoker   . Smokeless tobacco: Never Used  . Alcohol Use: No   OB History    No data available     Review of Systems  Unable to perform ROS: Patient nonverbal  Cardiovascular: Negative for chest pain.  Gastrointestinal: Negative for  abdominal pain.  Neurological: Negative for headaches.      Allergies  Review of patient's allergies indicates no known allergies.  Home Medications   Prior to Admission medications   Medication Sig Start Date End Date Taking? Authorizing Provider  amLODipine (NORVASC) 10 MG tablet Take 1 tablet (10 mg total) by mouth daily. For blood pressure 03/11/14  Yes Lance Bosch, NP  aspirin 325 MG tablet Place 1 tablet (325 mg total) into feeding tube daily. 07/08/13  Yes Ivan Anchors Love, PA-C  baclofen (LIORESAL) 10 MG tablet Take 1 tablet (10 mg total) by mouth 2 (two) times daily. To help with tone/spasms 11/07/13  Yes Lance Bosch, NP  gabapentin (NEURONTIN) 100 MG capsule Take 1 capsule (100 mg total) by mouth 3 (three) times daily. 11/07/13  Yes Lance Bosch, NP  hydrochlorothiazide (HYDRODIURIL) 25 MG tablet Take 25 mg by mouth daily.   Yes Historical Provider, MD  Insulin Glargine (LANTUS SOLOSTAR) 100 UNIT/ML Solostar Pen Inject 10 Units into the skin 2 (two) times daily. With breakfast and supper 12/08/13  Yes Nita Sells, MD  metoprolol (LOPRESSOR) 100 MG tablet Take 1 tablet (100 mg total) by mouth 3 (three) times daily. 01/01/14  Yes Lance Bosch, NP  polyethylene glycol powder (GLYCOLAX/MIRALAX) powder Take 17 g by mouth daily. 08/22/13  Yes Lance Bosch, NP  potassium chloride SA (K-DUR,KLOR-CON) 20 MEQ tablet Take 1 tablet (20 mEq total) by mouth 2 (two) times daily. 01/01/14  Yes Lance Bosch, NP  simvastatin (ZOCOR) 20 MG tablet Take 20 mg by mouth daily at 6 PM.   Yes Historical Provider, MD  acetaminophen (TYLENOL) 325 MG tablet Take 1-2 tablets (325-650 mg total) by mouth every 4 (four) hours as needed for mild pain (available over the counter). 07/08/13   Ivan Anchors Love, PA-C  atorvastatin (LIPITOR) 20 MG tablet Take 1 tablet (20 mg total) by mouth daily. 01/12/14   Lance Bosch, NP  Insulin Pen Needle 31G X 6 MM MISC Use as prescribed by physician 03/03/14    Lance Bosch, NP   BP 134/82 mmHg  Pulse 90  Temp(Src) 97.9 F (36.6 C) (Oral)  Resp 12  SpO2 98% Physical Exam  Constitutional: She appears well-developed and well-nourished. No distress.  HENT:  Head: Normocephalic and atraumatic.  Mouth/Throat: Oropharynx is clear and moist.  Eyes: EOM are normal. Pupils are equal, round, and reactive to light.  2 mm bilaterally and minimally reactive  Neck: Normal range of motion. Neck supple.  Cardiovascular: Normal rate and regular rhythm.   Pulmonary/Chest: Effort normal and breath sounds normal. No respiratory distress. She has no wheezes. She has no rales. She exhibits no tenderness.  Abdominal: Soft. Bowel sounds are normal. She exhibits no distension and no mass. There is no tenderness. There is no rebound and no guarding.  Musculoskeletal: Normal range of motion. She exhibits no edema or tenderness.  Distal pulses intact.  Neurological: She is alert.  Patient is awake and following commands. She has expressive aphasia. His right lower facial droop. 1/5 motor in the right upper extremity. 3/5 motor in the right lower extremity. Decreased sensation on the right compared to left. 5/5 motor in left upper and left lower extremities.  Skin: Skin is warm and dry. No rash noted. No erythema.  Psychiatric: She has a normal mood and affect. Her behavior is normal.  Nursing note and vitals reviewed.   ED Course  Procedures (including critical care time) Labs Review Labs Reviewed  DIFFERENTIAL - Abnormal; Notable for the following:    Neutrophils Relative % 39 (*)    Lymphocytes Relative 52 (*)    All other components within normal limits  COMPREHENSIVE METABOLIC PANEL - Abnormal; Notable for the following:    Potassium 3.4 (*)    Glucose, Bld 117 (*)    GFR calc non Af Amer 81 (*)    All other components within normal limits  URINALYSIS, ROUTINE W REFLEX MICROSCOPIC - Abnormal; Notable for the following:    APPearance HAZY (*)     Ketones, ur 15 (*)    Leukocytes, UA SMALL (*)    All other components within normal limits  I-STAT CHEM 8, ED - Abnormal; Notable for the following:    Potassium 3.4 (*)    Glucose, Bld 120 (*)    Hemoglobin 15.6 (*)    All other components within normal limits  ETHANOL  PROTIME-INR  APTT  CBC  URINE RAPID DRUG SCREEN (HOSP PERFORMED)  URINE MICROSCOPIC-ADD ON  I-STAT TROPOININ, ED  I-STAT TROPOININ, ED    Imaging Review Ct Head Wo Contrast  03/17/2014   CLINICAL DATA:  Altered mental status. Left MCA stroke 8 months ago.  EXAM: CT HEAD WITHOUT CONTRAST  TECHNIQUE: Contiguous axial images were obtained from the base of the skull through the vertex without intravenous contrast.  COMPARISON:  MRI and head CT dated 12/05/2013  FINDINGS: There is no acute intracranial hemorrhage, infarction, or mass lesion. There is an extensive old left  middle cerebral artery infarct with secondary encephalomalacia as secondary dilatation of the left lateral ventricle. There is wallerian degeneration extending into the left side of the pons.  No acute abnormalities.  Osseous structures are normal.  IMPRESSION: No acute abnormality.  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   Electronically Signed   By: Rozetta Nunnery M.D.   On: 03/17/2014 21:28     EKG Interpretation None      MDM   Final diagnoses:  None    Patient is at her neurologic baseline per daughter.  Normal workup in the emergency department. Patient continues to be at her baseline mental status. Unsure of cause of patient's brief alteration in mental status. Will discharge home to follow-up with her primary doctor. Return precautions given.  Julianne Rice, MD 03/17/14 [phone]

## 2014-03-24 ENCOUNTER — Encounter: Payer: Medicaid Other | Attending: Physical Medicine & Rehabilitation

## 2014-03-24 ENCOUNTER — Encounter: Payer: Self-pay | Admitting: Physical Medicine & Rehabilitation

## 2014-03-24 ENCOUNTER — Ambulatory Visit (HOSPITAL_BASED_OUTPATIENT_CLINIC_OR_DEPARTMENT_OTHER): Payer: Medicaid Other | Admitting: Physical Medicine & Rehabilitation

## 2014-03-24 VITALS — BP 118/62 | HR 64 | Resp 14

## 2014-03-24 DIAGNOSIS — I69390 Apraxia following cerebral infarction: Secondary | ICD-10-CM

## 2014-03-24 DIAGNOSIS — R482 Apraxia: Secondary | ICD-10-CM

## 2014-03-24 DIAGNOSIS — I63512 Cerebral infarction due to unspecified occlusion or stenosis of left middle cerebral artery: Secondary | ICD-10-CM

## 2014-03-24 DIAGNOSIS — G8110 Spastic hemiplegia affecting unspecified side: Secondary | ICD-10-CM | POA: Insufficient documentation

## 2014-03-24 DIAGNOSIS — I63312 Cerebral infarction due to thrombosis of left middle cerebral artery: Secondary | ICD-10-CM

## 2014-03-24 NOTE — Patient Instructions (Signed)
Consider Botox injection to loosen up the right hand and wrist, this should also help with pain during range of motion

## 2014-03-24 NOTE — Progress Notes (Signed)
Subjective:    Patient ID: [name], female    DOB: [birth year], 54 y.o.   MRN: [id number] 54 y.o. female with history of HTN, DM, bipolar disorder, who was admitted on 05/27/13 with right facial droop and aphasia. 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 Workup was normal. Discharged to home.  Pain Inventory Average Pain 3 Pain Right Now 1 My pain is burning, tingling and aching  In the last 24 hours, has pain interfered with the following? General activity 5 Relation with others 5 Enjoyment of life 5 What TIME of day is your pain at its worst? evening Sleep (in general) Fair  Pain is worse with: walking, bending, sitting, inactivity, standing and some activites Pain improves with: rest and medication Relief from Meds: 5  Mobility ability to climb steps?  no do you drive?  no use a wheelchair  Function disabled: date disabled 05/2013  Neuro/Psych weakness numbness trouble walking spasms dizziness  Prior Studies Any changes since last visit?  no  Physicians involved in your care Any changes since last visit?  no   Family History  Problem Relation Age of Onset  . Hypertension Mother   . Stroke Father   . Hypertension Father   . Diabetes Father    History   Social History  . Marital Status: Single    Spouse Name: N/A    Number of  Children: 3  . Years of Education: 12   Occupational History  . disabled    Social History Main Topics  . Smoking status: Never Smoker   . Smokeless tobacco: Never Used  . Alcohol Use: No  . Drug Use: No  . Sexual Activity: None   Other Topics Concern  . None   Social History Narrative   Past Surgical History  Procedure Laterality Date  . Cesarean section  ~ 1987  . Tubal ligation    . Tee without cardioversion N/A 12/08/2013    Procedure: TRANSESOPHAGEAL ECHOCARDIOGRAM (TEE);  Surgeon: Candee Furbish, MD;  Location: [facility] ENDOSCOPY;  Service: Cardiovascular;  Laterality: N/A;   Past Medical History  Diagnosis Date  . Hypertension   . Depression   . Diabetes mellitus     ?type (05/29/2013)  . Bipolar affective disorder   . Schizophrenia     [name] 11/28/2007 (05/29/2013)  . Stroke 05/27/2013   BP 118/62 mmHg  Pulse 64  Resp 14  SpO2 100%  Opioid Risk Score:   Fall Risk Score: Low Fall Risk (0-5 points)  Review of Systems  Constitutional: Positive for unexpected weight change.       Weight loss  HENT: Negative.   Eyes: Negative.   Respiratory: Negative.   Cardiovascular: Negative.   Gastrointestinal: Negative.   Endocrine: Negative.   Genitourinary: Negative.   Musculoskeletal: Positive for myalgias.  Skin: Negative.   Allergic/Immunologic: Negative.   Neurological: Positive for dizziness, weakness and numbness.  Trouble walking, spasms  Hematological: Negative.   Psychiatric/Behavioral: Positive for confusion and dysphoric mood. The patient is nervous/anxious.        Objective:   Physical Exam  Constitutional: She appears well-developed and well-nourished.  Neurological:  Motor strength is 0/5 in the right deltoid, bicep, tricep, grip Right upper extremity tone Ashworth grade 3 at the finger flexors and 2 at the wrist flexors.  Right lower extremity strength 3 minus at the hip flexors and knee extensors, 0 at the ankle dorsiflexors. Right lower  extremity tone increased at the hamstring modified Ashworth score to Left sided strength and tone are normal  Speech is dysarthric and aphasic. Is able to follow commands. Minimal verbal responses.  Reduced sensation to pinch right hand Patient indicates light touch sensation is equal in both legs  Psychiatric:  Aphasia   Nursing note and vitals reviewed.         Assessment & Plan:  1.  Left MCA infarct with right hemiparesis as well as a aphasia. Would benefit from further outpatient PT, OT, speech therapy. Will make a referral for this.Has had some improvement in right hip flexor and knee extensor strength since last visit. Follow-up with primary care this week, follow-up with neurology later this month Return to clinic 6 weeks, discussed that she may benefit from Botox injection for right upper extremity spasticity, currently declining

## 2014-03-24 NOTE — Telephone Encounter (Signed)
[name] from [facility] for [facility] called for the patient called to request a letter for the patient to clear her for dental work, please f/u with patient for further questions at [phone].

## 2014-03-25 ENCOUNTER — Ambulatory Visit: Payer: Medicaid Other | Admitting: Internal Medicine

## 2014-03-25 ENCOUNTER — Telehealth: Payer: Self-pay | Admitting: Emergency Medicine

## 2014-03-25 NOTE — Telephone Encounter (Signed)
Please call patient and find out what procedure she is having and what dentist office.  I tried to call Neoma Laming from [facility] but no answer or name on the voicemail.

## 2014-03-25 NOTE — Telephone Encounter (Signed)
Spoke with pt daughter, states her mother needs clearance for dental cleaning due to stroke

## 2014-03-30 NOTE — Telephone Encounter (Signed)
Dental Office [phone]  Called dental office, office will fax clearance today

## 2014-03-30 NOTE — Telephone Encounter (Signed)
No, can you call dentist office and let them know that I never received the fax for medical clearance. Patients daughter can give you the phone number

## 2014-04-02 ENCOUNTER — Ambulatory Visit (INDEPENDENT_AMBULATORY_CARE_PROVIDER_SITE_OTHER): Payer: Medicaid Other | Admitting: Neurology

## 2014-04-02 ENCOUNTER — Encounter: Payer: Self-pay | Admitting: Internal Medicine

## 2014-04-02 ENCOUNTER — Ambulatory Visit: Payer: Medicaid Other | Attending: Internal Medicine | Admitting: Internal Medicine

## 2014-04-02 ENCOUNTER — Encounter: Payer: Self-pay | Admitting: Neurology

## 2014-04-02 VITALS — BP 119/82 | HR 63 | Temp 98.5°F | Resp 16

## 2014-04-02 VITALS — BP 120/78 | HR 65

## 2014-04-02 DIAGNOSIS — I63512 Cerebral infarction due to unspecified occlusion or stenosis of left middle cerebral artery: Secondary | ICD-10-CM

## 2014-04-02 DIAGNOSIS — A499 Bacterial infection, unspecified: Secondary | ICD-10-CM

## 2014-04-02 DIAGNOSIS — R32 Unspecified urinary incontinence: Secondary | ICD-10-CM | POA: Diagnosis not present

## 2014-04-02 DIAGNOSIS — N760 Acute vaginitis: Secondary | ICD-10-CM | POA: Diagnosis not present

## 2014-04-02 DIAGNOSIS — K5900 Constipation, unspecified: Secondary | ICD-10-CM

## 2014-04-02 DIAGNOSIS — G8110 Spastic hemiplegia affecting unspecified side: Secondary | ICD-10-CM

## 2014-04-02 DIAGNOSIS — Z794 Long term (current) use of insulin: Secondary | ICD-10-CM | POA: Diagnosis not present

## 2014-04-02 DIAGNOSIS — Z7982 Long term (current) use of aspirin: Secondary | ICD-10-CM | POA: Insufficient documentation

## 2014-04-02 DIAGNOSIS — R159 Full incontinence of feces: Secondary | ICD-10-CM | POA: Insufficient documentation

## 2014-04-02 DIAGNOSIS — R4701 Aphasia: Secondary | ICD-10-CM | POA: Insufficient documentation

## 2014-04-02 DIAGNOSIS — I63312 Cerebral infarction due to thrombosis of left middle cerebral artery: Secondary | ICD-10-CM

## 2014-04-02 DIAGNOSIS — I10 Essential (primary) hypertension: Secondary | ICD-10-CM

## 2014-04-02 DIAGNOSIS — Z8673 Personal history of transient ischemic attack (TIA), and cerebral infarction without residual deficits: Secondary | ICD-10-CM | POA: Insufficient documentation

## 2014-04-02 DIAGNOSIS — B9689 Other specified bacterial agents as the cause of diseases classified elsewhere: Secondary | ICD-10-CM

## 2014-04-02 DIAGNOSIS — E119 Type 2 diabetes mellitus without complications: Secondary | ICD-10-CM | POA: Diagnosis present

## 2014-04-02 DIAGNOSIS — R002 Palpitations: Secondary | ICD-10-CM

## 2014-04-02 DIAGNOSIS — E1149 Type 2 diabetes mellitus with other diabetic neurological complication: Secondary | ICD-10-CM

## 2014-04-02 DIAGNOSIS — I639 Cerebral infarction, unspecified: Secondary | ICD-10-CM

## 2014-04-02 DIAGNOSIS — I6340 Cerebral infarction due to embolism of unspecified cerebral artery: Secondary | ICD-10-CM

## 2014-04-02 LAB — GLUCOSE, POCT (MANUAL RESULT ENTRY): POC Glucose: 131 mg/dl — AB (ref 70–99)

## 2014-04-02 LAB — POCT GLYCOSYLATED HEMOGLOBIN (HGB A1C): Hemoglobin A1C: 5.8

## 2014-04-02 MED ORDER — METRONIDAZOLE 500 MG PO TABS: 500.0000 mg | ORAL_TABLET | Freq: Two times a day (BID) | ORAL | Status: DC

## 2014-04-02 MED ORDER — SENNA 8.6 MG PO TABS: 1.0000 | ORAL_TABLET | Freq: Every day | ORAL | Status: DC | PRN

## 2014-04-02 NOTE — Progress Notes (Signed)
Patient ID: [name], female   DOB: 02-28-1961, 54 y.o.   MRN: [id number]  CC: follow up  HPI: [name] is a 54 y.o. female here today for a follow up visit.  Patient has past medical history of HTN,depression, T2DM, bipolar disorder, schizophrenia, and stroke. She presents with her daughter who is her caregiver.  Patient was recently evaluated in the ER for altered mental status in which recurrent stroke was quickly ruled out.  She has continued to follow up with Neurology who she see today.  The patient is at baseline today as far as I can tell.    Her daughter reports that the patient has developed a large amount of thin white vaginal discharge with a strong odor.  He daughter states that it is very similar to when the patient had bacterial vaginosis.  The patient does continue to wear adult diapers and is incontinent of stool and urine.   Her daughter also reports continued constipation even with miralax use.  She reports that the stools are very hard and the patient strains often. The patient has increased her fruits and vegetable intake. She drinks one can of ensure per day.  Patient has also progressed to being able to to use roller walker and sit in regular chairs with assistance.  No Known Allergies Past Medical History  Diagnosis Date  . Hypertension   . Depression   . Diabetes mellitus     ?type (05/29/2013)  . Bipolar affective disorder   . Schizophrenia     [name] 11/28/2007 (05/29/2013)  . Stroke 05/27/2013   Current Outpatient Prescriptions on File Prior to Visit  Medication Sig Dispense Refill  . acetaminophen (TYLENOL) 325 MG tablet Take 1-2 tablets (325-650 mg total) by mouth every 4 (four) hours as needed for mild pain (available over the counter).    Marland Kitchen amLODipine (NORVASC) 10 MG tablet Take 1 tablet (10 mg total) by mouth daily. For blood pressure 90 tablet 3  . aspirin 325 MG tablet Place 1 tablet (325 mg total) into feeding tube daily. 30 tablet 1  .  baclofen (LIORESAL) 10 MG tablet Take 1 tablet (10 mg total) by mouth 2 (two) times daily. To help with tone/spasms 60 tablet 1  . gabapentin (NEURONTIN) 100 MG capsule Take 1 capsule (100 mg total) by mouth 3 (three) times daily. 90 capsule 1  . hydrochlorothiazide (HYDRODIURIL) 25 MG tablet Take 25 mg by mouth daily.    . Insulin Glargine (LANTUS SOLOSTAR) 100 UNIT/ML Solostar Pen Inject 10 Units into the skin 2 (two) times daily. With breakfast and supper 15 pen   . Insulin Pen Needle 31G X 6 MM MISC Use as prescribed by physician 100 each 12  . metoprolol (LOPRESSOR) 100 MG tablet Take 1 tablet (100 mg total) by mouth 3 (three) times daily. 90 tablet 0  . polyethylene glycol powder (GLYCOLAX/MIRALAX) powder Take 17 g by mouth daily. 527 g 2  . simvastatin (ZOCOR) 20 MG tablet Take 20 mg by mouth daily at 6 PM.     No current facility-administered medications on file prior to visit.   Family History  Problem Relation Age of Onset  . Hypertension Mother   . Stroke Father   . Hypertension Father   . Diabetes Father    History   Social History  . Marital Status: Single    Spouse Name: N/A    Number of Children: 3  . Years of Education: 12   Occupational History  .  disabled    Social History Main Topics  . Smoking status: Never Smoker   . Smokeless tobacco: Never Used  . Alcohol Use: No  . Drug Use: No  . Sexual Activity: Not on file   Other Topics Concern  . Not on file   Social History Narrative   Patient is single with 3 children.   Patient is right handed.   Patient has hs education.   Patient does not drink caffeine.    Review of Systems: Limited due to patient's expressive aphasia   Objective:   Filed Vitals:   04/02/14 1438  BP: 119/82  Pulse: 63  Temp: 98.5 F (36.9 C)  Resp: 16    Physical Exam  Cardiovascular: Normal rate, regular rhythm and normal heart sounds.   Pulmonary/Chest: Effort normal and breath sounds normal.  Abdominal: Soft. Bowel  sounds are normal.  Skin: Skin is warm and dry.     Lab Results  Component Value Date   WBC 6.1 03/17/2014   HGB 15.6* 03/17/2014   HCT 46.0 03/17/2014   MCV 86.0 03/17/2014   PLT 235 03/17/2014   Lab Results  Component Value Date   CREATININE 0.80 03/17/2014   BUN 13 03/17/2014   NA 142 03/17/2014   K 3.4* 03/17/2014   CL 100 03/17/2014   CO2 28 03/17/2014    Lab Results  Component Value Date   HGBA1C 5.80 04/02/2014   Lipid Panel     Component Value Date/Time   CHOL 105 12/06/2013 0345   TRIG 66 12/06/2013 0345   HDL 42 12/06/2013 0345   CHOLHDL 2.5 12/06/2013 0345   VLDL 13 12/06/2013 0345   LDLCALC 50 12/06/2013 0345       Assessment and plan:   [name] was seen today for follow-up.  Diagnoses and associated orders for this visit:  Type 2 diabetes mellitus without complication - Glucose (CBG) - HgB A1c  Bacterial vaginal infection - metroNIDAZOLE (FLAGYL) 500 MG tablet; Take 1 tablet (500 mg total) by mouth 2 (two) times daily.  Constipation, unspecified constipation type - senna (SENOKOT) 8.6 MG TABS tablet; Take 1 tablet (8.6 mg total) by mouth daily as needed for mild constipation. May use 2-3 weekly with addition to Miralax. Explained increased water intake and mobility that is limiting peristalsis.    Return in about 3 months (around 07/02/2014).    Chari Manning, NP-C [facility] [phone] 04/02/2014, 3:19 PM

## 2014-04-02 NOTE — Progress Notes (Signed)
PATIENT: [name] DOB: 25-Feb-1961  REASON FOR VISIT: Hospital follow up for stroke HISTORY FROM: daughter and patient  HISTORY OF PRESENT ILLNESS: 54 y.o. [name] female who comes to the office for first hospital follow up post hospital discharge for stroke. She has a history of HTN, DM, bipolar disorder, who was admitted on 05/27/13 with right facial droop and aphasia. 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 Infarcts felt to be embolic due to unknown source and she was placed on ASA for secondary stroke prevention.  Patient had progressive neurologic worsening on 05/29/13 pm with decrease in LOC and right sided weakness. EEG negative for seizures and CCT without significant changes; felt patient with progression of symptoms due to relative hypotension and recommended allowing significantly high permissive hypertension in this patient. Hgb A1c was 14.1 in hospital, now 5.9.  LDL was 130. Hypercoagulable workup negative. 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 No other intracranial flow-limiting stenosis or embolic disease evident. Areas of low density in the left MCA territory consistent with infarction. 2D Echocardiogram with EF of 55-60% with no source of embolus. TEE was not done due to hypotension in hospital. Patient with resultant right hemiparesis and aphasia.  Has had PEG tube removed by radiology in June. Eating well and drinking well. Remains aphasic. Contemplating Botox injections to the wrist and finger flexors, hamstrings, and gastrocnemius by Dr.  Letta Pate. Using Baclofen for spasticity and gabapentin for pain. 55 year old daughter is providing all care.   01/05/14 follow up - she still takes full dose ASA and zocor for stroke prevention. Follows with PCP for BP and DM. RLE muscle strength improved but RUE still flaccid. Remains aphasic with only "Thank you" and "yes/no". She had inpt rehab followed by home PT/OT but then discharged from home PT/OT but no outpt PT/OT initiated yet. Daughter is going to check the status of outpt PT/OT.  Interval history During the interval time, patient has been doing the same. Still has significant aphasia and RUE weakness, but she was able to walk with semi-walker now. She follows with Dr. Read Drivers and will start PT and botox injection again. Her loop recorder never put in, will have to refer her again. She went to ER on 03/17/14 for episode of holding head, do not respond on commands, keep sticking in and out of her tougue for about 5 min, no shaking jerking or LOC. Daughter concerned and pt sent to ER, repeat CT no acute changes and pt back to baseline.   REVIEW OF SYSTEMS: Full 14 system review of systems performed and notable only for: eye itching, ringing in ears, runny nose, light sensitivity, eye pain, leg swelling, cold intolerance, excessive thirst, constipation, frequent waking, joint pain, aching muscles, walking difficulty, speech difficulty, weakness, agitation and confusion.  ALLERGIES: No Known Allergies  HOME MEDICATIONS: Outpatient Prescriptions Prior to Visit  Medication Sig Dispense Refill  . acetaminophen (TYLENOL) 325 MG tablet Take 1-2 tablets (325-650 mg total) by mouth every 4 (four) hours as needed for mild pain (available over the counter).    Marland Kitchen amLODipine (NORVASC) 10 MG tablet  Take 1 tablet (10 mg total) by mouth daily. For blood pressure 90 tablet 3  . aspirin 325 MG tablet Place 1 tablet (325 mg total) into feeding tube daily. 30 tablet 1  . baclofen (LIORESAL) 10 MG tablet  Take 1 tablet (10 mg total) by mouth 2 (two) times daily. To help with tone/spasms 60 tablet 1  . gabapentin (NEURONTIN) 100 MG capsule Take 1 capsule (100 mg total) by mouth 3 (three) times daily. 90 capsule 1  . hydrochlorothiazide (HYDRODIURIL) 25 MG tablet Take 25 mg by mouth daily.    . Insulin Glargine (LANTUS SOLOSTAR) 100 UNIT/ML Solostar Pen Inject 10 Units into the skin 2 (two) times daily. With breakfast and supper 15 pen   . Insulin Pen Needle 31G X 6 MM MISC Use as prescribed by physician 100 each 12  . metoprolol (LOPRESSOR) 100 MG tablet Take 1 tablet (100 mg total) by mouth 3 (three) times daily. 90 tablet 0  . polyethylene glycol powder (GLYCOLAX/MIRALAX) powder Take 17 g by mouth daily. 527 g 2  . simvastatin (ZOCOR) 20 MG tablet Take 20 mg by mouth daily at 6 PM.    . atorvastatin (LIPITOR) 20 MG tablet Take 1 tablet (20 mg total) by mouth daily. 90 tablet 3  . potassium chloride SA (K-DUR,KLOR-CON) 20 MEQ tablet Take 1 tablet (20 mEq total) by mouth 2 (two) times daily. 8 tablet 0   No facility-administered medications prior to visit.    PHYSICAL EXAM Filed Vitals:   04/02/14 1215  BP: 120/78  Pulse: 65   Cannot calculate BMI with a height equal to zero. No exam data present  Generalized: Well developed, in no acute distress  Head: normocephalic and atraumatic. Oropharynx benign  Neck: Supple, no carotid bruits  Cardiac: Regular rate rhythm, no murmur   Neurological examination  Mentation: Orientation unable to assess due to aphasia.  Severe expressive aphasia with only "Thank you" and no sentences. Able to comprehend 1-2 simple commands. Unable to name or repeat.  Cranial nerve II-XII: Fundoscopic exam not able to see through. Pupils were equal round reactive to light, extraocular movements were full.  Facial droop on the right, normal on the left. Hearing was intact to finger rubbing bilaterally. Tongue in middle. Head turning and shoulder shrug are asymmetric,  impaired on right. Motor: Right upper extremity distal strength 0/5, proximal 2/5, right lower extremity strength is 4/5. Left upper and lower extremity strength 5/5. Mildly increased tone on the right Sensory: Sensory testing is intact to soft touch LUE and LLE. Evidence of extinction is noted on the right. Coordination: Cerebellar testing not able to do on the right. Gait and station: Gait is not tested, in wheelchair Reflexes: Deep tendon reflexes are symmetric and decreased bilaterally.   I have personally reviewed the radiological images below and agree with the radiology interpretations.  EEG 05/2013 - This is an abnormal EEG secondary to slowing over the left hmeisphere with frequent left hemispheric sharp acitivty as well.. This finding is suggestive of a focal disturbance that is etiologically nonspecific, but may include a mass lesion or post-ictal focal slowing among other possibilities.   CT of the brain 05/27/2013 No acute intracranial pathology.   CT brain 03/17/14 - No acute abnormality. 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  CTA head 05/2013- Severe stenosis of the left middle cerebral artery at the M1 segment with markedly diminished visualization of the more distal left MCA branches. No other intracranial flow-limiting stenosis or embolic disease evident.  Areas of low density in the left MCA territory consistent with infarction. No hemorrhagic transformation. Question new low density in the pons, not definite.  MRI of the brain 05/27/2013 Multiple foci of acute ischemia within the left middle cerebral artery territory. In addition, however punctate focus of acute ischemia within the left occipital lobe and right thalamus (both of which would be posterior circulation). All areas of ischemia have similar acuity. No hemorrhagic conversion. Symmetric abnormal bright signal within the lenticulostriate nuclei (basal ganglia) in addition to abnormal temporoccipital  periventricular T2 hyperintense signal. These findings may reflect sequelae of acute severe hypertension (PRES) but, it can also be seen with osmotic demyelination, autoimmune disorders such as scleroderma (which can have associated vasculopathy), less likely infectious encephalitis. Moderate to severe white matter changes, in a distribution which can be seen with demyelination, with superimposed possible chronic small vessel ischemic disease though, are nonspecific.   MRI brain 12/05/13 - No evidence of acute ischemia, specifically no acute LEFT sided stroke. Remote large LEFT middle cerebral artery territory hemorrhagic infarct. Similar abnormal signal within the basal ganglia and pons could reflect metabolic disorder, sequelae of possible PRES, less likely pontine/extra pontine myelinolysis, findings are similar. Remote bilateral thalamus lacunar infarcts. Moderate white matter changes suggest chronic small vessel ischemic disease.  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 Mild luminal irregularity of the intracranial vessels which can be seen with intracranial atherosclerosis though, may also be seen with vasculopathy.   2D Echocardiogram EF 55-60% with no source of embolus.   Carotid Doppler No evidence of hemodynamically significant internal carotid artery stenosis. Vertebral artery flow is antegrade.   TCD emboli detection -  negative  Component     Latest Ref Rng 05/28/2013 05/30/2013 08/22/2013 12/05/2013  PTT Lupus Anticoagulant     28.0 - 43.0 secs 37.9     PTTLA Confirmation     <8.0 secs NOT APPL     PTTLA 4:1 Mix     28.0 - 43.0 secs NOT APPL     DRVVT     <42.9 secs 35.1     Drvvt confirmation     <1.11 Ratio NOT APPL     dRVVT Incubated 1:1 Mix     <42.9 secs NOT APPL     Lupus Anticoagulant     NOT DETECTED NOT DETECTED     Cholesterol     0 - 200 mg/dL 201 (H)     Triglycerides     <150 mg/dL 145     HDL     >39 mg/dL 42      Total CHOL/HDL Ratio      4.8     VLDL     0 - 40 mg/dL 29     LDL (calc)     0 - 99 mg/dL 130 (H)     Anticardiolipin IgG     <23 GPL U/mL 5 (L)     Anticardiolipin IgM     <11 MPL U/mL 2 (L)     Anticardiolipin IgA     <22 APL U/mL 10 (L)     Hgb A1c MFr Bld      14.6 (H) 14.1 (H) 6.8   Mean Plasma Glucose     <117 mg/dL 372 (H) 358 (H)    C3 Complement     90 - 180 mg/dL 115     Complement C4, Body Fluid     10 - 40 mg/dL 31     Compl, Total (  CH50)     31 - 60 U/mL >60 (H)     ANA Ser Ql     NEGATIVE NEGATIVE     RPR     NON REACTIVE NON REACTIVE     Sed Rate     0 - 22 mm/hr 25 (H)     AntiThromb III Func     75 - 120 % 125 (H)     Protein C Activity     75 - 133 % 200 (H)     Protein C, Total     72 - 160 % 165 (H)     Protein S Activity     69 - 129 % 120     Protein S Ag, Total     60 - 150 % 133     Homocysteine     4.0 - 15.4 umol/L 8.0     Recommendations-PTGENE:      (NOTE)     HIV     NON REACTIVE NON REACTIVE     TSH     0.350 - 4.500 uIU/mL    1.690    ASSESSMENT: 54 y.o. year old [place] female  has a past medical history of Hypertension; Depression, Diabetes mellitus; Bipolar affective disorder, Schizophrenia followed up in clinic for Left MCA distribution infarct from 05/27/13. 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 Infarcts felt to be embolic secondary to unknown source, although not able to rule out large vessel asthero due to severe HTN and uncontrolled DM.  Residual right hemiparesis and severe aphasia. Needs assistance with all ADL's. Currently BP and glucose in good control. Episode in 03/17/14 has low suspicious for seizure.   PLAN: - continue ASA and liptor for stroke prevention  - will arrange loop recorder through Dr. Rayann Heman again - follow up with Dr. Read Drivers for rehab - referral to speech  therapy  - will check beta 2 glycoprotein antibody for hypercoagulable work up  - check BP and glucose at home - follow up in 3 months.   Rosalin Hawking, MD PhD [facility] [address], [place], [place] [address] [phone]  Orders Placed This Encounter  Procedures  . Beta-2 glycoprotein antibodies  . Ambulatory referral to Speech Therapy    Referral Priority:  Routine    Referral Type:  Speech Therapy    Referral Reason:  Specialty Services Required    Requested Specialty:  Speech Pathology    Number of Visits Requested:  1  . Ambulatory referral to Cardiac Electrophysiology    Referral Priority:  Routine    Referral Type:  Consultation    Referral Reason:  Specialty Services Required    Referred to Provider:  Thompson Grayer, MD    Requested Specialty:  Cardiology    Number of Visits Requested:  1     Patient Instructions  - continue ASA and liptor for stroke prevention  - will arrange loop recorder through Dr. Rayann Heman again - follow up with Dr. Read Drivers for rehab and botox injection - referral to speech therapy again - will check beta 2 glycoprotein antibody for stroke - check BP and glucose at home - follow up in 3 months.

## 2014-04-02 NOTE — Patient Instructions (Addendum)
-   continue ASA and liptor for stroke prevention  - will arrange loop recorder through Dr. Rayann Heman again - follow up with Dr. Read Drivers for rehab and botox injection - referral to speech therapy again - will check beta 2 glycoprotein antibody for stroke - check BP and glucose at home - follow up in 3 months.

## 2014-04-02 NOTE — Progress Notes (Signed)
Pt is here following up on her diabetes and HTN. Even with the miralax pt is still constipated.

## 2014-04-04 LAB — BETA-2 GLYCOPROTEIN ANTIBODIES
Beta-2 Glyco 1 IgA: <9 GPI IgA units (ref 0–25)
Beta-2 Glyco 1 IgM: <9 GPI IgM units (ref 0–32)
Beta-2 Glyco I IgG: <9 GPI IgG units (ref 0–20)

## 2014-04-08 ENCOUNTER — Telehealth: Payer: Self-pay | Admitting: *Deleted

## 2014-04-08 NOTE — Telephone Encounter (Signed)
-----   Message from Rance Muir sent at 04/07/2014  8:59 AM EST -----   ----- Message -----    From: Rosalin Hawking, MD    Sent: 04/06/2014   3:36 PM      To: Honor, could you please let her know that her blood test 4 days ago were all negative. Please let her continue her current treatment without change. Thanks.  Rosalin Hawking, MD PhD Stroke Neurology 04/06/2014 3:34 PM

## 2014-04-08 NOTE — Telephone Encounter (Signed)
Called patient and informed her that blood work was negative and to continue on current treatment plan.

## 2014-04-13 ENCOUNTER — Institutional Professional Consult (permissible substitution): Payer: Medicaid Other | Admitting: Internal Medicine

## 2014-04-23 ENCOUNTER — Ambulatory Visit: Payer: Medicaid Other | Admitting: Speech Pathology

## 2014-04-28 ENCOUNTER — Ambulatory Visit (HOSPITAL_BASED_OUTPATIENT_CLINIC_OR_DEPARTMENT_OTHER): Payer: Medicaid Other | Admitting: Internal Medicine

## 2014-04-28 ENCOUNTER — Ambulatory Visit: Payer: Medicaid Other | Admitting: Speech Pathology

## 2014-04-28 ENCOUNTER — Telehealth: Payer: Self-pay | Admitting: Internal Medicine

## 2014-04-28 ENCOUNTER — Encounter: Payer: Medicaid Other | Admitting: Speech Pathology

## 2014-04-28 ENCOUNTER — Ambulatory Visit: Payer: Medicaid Other | Admitting: Occupational Therapy

## 2014-04-28 ENCOUNTER — Ambulatory Visit: Payer: Medicaid Other

## 2014-04-28 ENCOUNTER — Ambulatory Visit (HOSPITAL_COMMUNITY)
Admission: RE | Admit: 2014-04-28 | Discharge: 2014-04-28 | Disposition: A | Discharge status: Home / Self Care | Payer: Medicaid Other | Source: Ambulatory Visit | Attending: Internal Medicine | Admitting: Internal Medicine

## 2014-04-28 VITALS — BP 145/87 | HR 67 | Temp 98.2°F | Resp 18

## 2014-04-28 DIAGNOSIS — R0602 Shortness of breath: Secondary | ICD-10-CM | POA: Insufficient documentation

## 2014-04-28 DIAGNOSIS — R059 Cough, unspecified: Secondary | ICD-10-CM

## 2014-04-28 DIAGNOSIS — R05 Cough: Secondary | ICD-10-CM

## 2014-04-28 DIAGNOSIS — E119 Type 2 diabetes mellitus without complications: Secondary | ICD-10-CM | POA: Diagnosis not present

## 2014-04-28 MED ORDER — CIPROFLOXACIN HCL 500 MG PO TABS: 500.0000 mg | ORAL_TABLET | Freq: Two times a day (BID) | ORAL | Status: AC

## 2014-04-28 MED ORDER — CIPROFLOXACIN HCL 500 MG PO TABS: 500.0000 mg | ORAL_TABLET | Freq: Two times a day (BID) | ORAL | Status: DC

## 2014-04-28 NOTE — Patient Instructions (Signed)

## 2014-04-28 NOTE — Telephone Encounter (Signed)
Patient's daughter called to speak to nurse about her blood pressure running high. Please f/u with pt.

## 2014-04-28 NOTE — Progress Notes (Signed)
Patient came in via w/c with daughter c/o 2-3 days of watery stools, painful urination, urinary freuency, non-productive cough, sneezing, nasal congestion,  clear nasal drainage, mild headache, chills, and right-sided body aches and weakness Patient denies fever, sore throat, ear pain U/a attempted, daughter states that pt was incontinent of stool and urine and stool was in vagina   BP 145/87 P 67 T  98.2 oral R 18 SPO2 100%  No cervical lymphadenopathy noted Ears: canals with cerumen but without redness or drainage; tympanic membranes intact without redness or bulging Throat: clear without redness or drainage; tongue with yellow/green coating, patient denies pain Nose: nares engorged with clear drainage noted Lungs: diminished breath sounds noted throughout; may be due to patient effort following stroke  PCP in to examine  Per PCP: Cipro 500 mg bid x 7 days CXR  Daughter instructed on providing good oral hygiene Attempt to keep patient clean and dry  Patient encouraged to rest, drink plenty of fluids (water) Call back if sx worsen or fail to improve

## 2014-04-30 ENCOUNTER — Institutional Professional Consult (permissible substitution): Payer: Medicaid Other | Admitting: Internal Medicine

## 2014-04-30 ENCOUNTER — Telehealth: Payer: Self-pay | Admitting: Emergency Medicine

## 2014-04-30 NOTE — Telephone Encounter (Signed)
Patient needs to reschedule health coach. No answer, left message

## 2014-05-05 ENCOUNTER — Ambulatory Visit: Payer: Medicaid Other | Admitting: Physical Medicine & Rehabilitation

## 2014-05-05 ENCOUNTER — Ambulatory Visit: Payer: Medicaid Other

## 2014-05-05 NOTE — Telephone Encounter (Signed)
-----   Message from Lance Bosch, NP sent at 05/01/2014 10:58 PM EST ----- Chest xray is normal

## 2014-05-05 NOTE — Telephone Encounter (Signed)
Pt Daughter aware of Xray results Stated BP running good now

## 2014-05-08 ENCOUNTER — Ambulatory Visit (HOSPITAL_BASED_OUTPATIENT_CLINIC_OR_DEPARTMENT_OTHER): Payer: Medicaid Other | Admitting: Physical Medicine & Rehabilitation

## 2014-05-08 ENCOUNTER — Encounter: Payer: Self-pay | Admitting: Physical Medicine & Rehabilitation

## 2014-05-08 ENCOUNTER — Encounter: Payer: Medicaid Other | Attending: Physical Medicine & Rehabilitation

## 2014-05-08 VITALS — BP 133/82 | HR 66 | Resp 14

## 2014-05-08 DIAGNOSIS — G8110 Spastic hemiplegia affecting unspecified side: Secondary | ICD-10-CM | POA: Diagnosis not present

## 2014-05-08 DIAGNOSIS — M7541 Impingement syndrome of right shoulder: Secondary | ICD-10-CM

## 2014-05-08 DIAGNOSIS — M7581 Other shoulder lesions, right shoulder: Secondary | ICD-10-CM

## 2014-05-08 NOTE — Patient Instructions (Signed)
Cortisone injection right subacromial area today  Consider Botox to the wrist and fingers if physical therapy and occupational therapy do not improve the pain

## 2014-05-08 NOTE — Progress Notes (Signed)
Subjective:    Patient ID: [name], female    DOB: 24-Jun-1960, 54 y.o.   MRN: [id number] 54 y.o. female with history of HTN, DM, bipolar disorder, who was admitted on 05/27/13 with right facial droop and aphasia. 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 Infarcts felt to be embolic due to unknown source and she was placed on ASA for secondary stroke  HPI Using walker with Modified independent, gets to bathroom with Modified independent  Needs assistance for ADLs, has CNA 4 hours per day to help with ADLs and household tasks  No Falls No further ER visits.  Pain Inventory Average Pain 4 Pain Right Now 0 My pain is constant, dull, tingling and aching  In the last 24 hours, has pain interfered with the following? General activity 2 Relation with others 2 Enjoyment of life 4 What TIME of day is your pain at its worst? evening, night  Sleep (in general) Fair  Pain is worse with: walking, bending, sitting, inactivity and standing Pain improves with: rest, therapy/exercise and medication Relief from Meds: 4  Mobility walk with assistance use a walker how many minutes can you walk? 2-3 ability to climb steps?  yes do you drive?  no use a wheelchair needs help with transfers Do you have any goals in this area?  yes  Function disabled: date disabled . I need assistance with the following:  dressing, bathing, toileting, meal prep, household duties and shopping Do you have any goals in this area?  yes  Neuro/Psych bladder control problems bowel control problems tremor tingling trouble walking spasms  Prior Studies Any changes since last visit?   no  Physicians involved in your care Any changes since last visit?  no   Family History  Problem Relation Age of Onset  . Hypertension Mother   . Stroke Father   . Hypertension Father   . Diabetes Father    History   Social History  . Marital Status: Single    Spouse Name: N/A  . Number of Children: 3  . Years of Education: 12   Occupational History  . disabled    Social History Main Topics  . Smoking status: Never Smoker   . Smokeless tobacco: Never Used  . Alcohol Use: No  . Drug Use: No  . Sexual Activity: Not on file   Other Topics Concern  . None   Social History Narrative   Patient is single with 3 children.   Patient is right handed.   Patient has hs education.   Patient does not drink caffeine.   Past Surgical History  Procedure Laterality Date  . Cesarean section  ~ 1987  . Tubal ligation    . Tee without cardioversion N/A 12/08/2013    Procedure: TRANSESOPHAGEAL ECHOCARDIOGRAM (TEE);  Surgeon: Candee Furbish, MD;  Location: [facility] ENDOSCOPY;  Service: Cardiovascular;  Laterality: N/A;   Past Medical History  Diagnosis Date  . Hypertension   . Depression   . Diabetes mellitus     ?type (05/29/2013)  . Bipolar affective disorder   . Schizophrenia     [name] 11/28/2007 (05/29/2013)  . Stroke 05/27/2013   BP 133/82 mmHg  Pulse 66  Resp 14  SpO2 100%  Opioid Risk Score:   Fall Risk Score: Low Fall Risk (0-5 points)  Review of Systems  Constitutional:       Weight loss  Gastrointestinal: Positive for diarrhea.  Musculoskeletal: Positive for gait problem.  Neurological: Positive for tremors.       Tingling  Spasms   All other systems reviewed and are negative.      Objective:   Physical Exam  Constitutional: She appears well-developed and well-nourished.  Neurological:  Motor strength is 0/5 in the right deltoid, bicep, tricep, grip Right upper extremity tone Ashworth grade 3 at the finger flexors and 2 at the wrist flexors.  Right lower  extremity strength 3 minus at the hip flexors and knee extensors, 0 at the ankle dorsiflexors. Right lower extremity tone increased at the hamstring modified Ashworth score 2 Left sided strength and tone are normal  Speech is dysarthric and aphasic. Is able to follow commands. Minimal verbal responses.  Reduced sensation to pinch right hand Patient indicates light touch sensation is equal in both legs  Psychiatric:  Aphasia Able to correctly gesture how to use a pen but cannot name a pen. Nursing note and vitals reviewed. Right shoulder pain with range of motion.Positive impingement signs 1 finger breath subluxation at the right shoulder      Assessment & Plan:  1. Left MCA infarct with right hemiparesis as well as a aphasia. Would benefit from further outpatient PT, OT, speech therapy. Will make a referral for this.Follow-up with primary care this week, follow-up with neurology later this month  Return to clinic 4 weeks, discussed that she may benefit from Botox injection for right upper extremity spasticity, currently declining 2. Right shoulder subacromial impingement syndrome given her recent stroke not a good candidate for nonsteroidal anti-inflammatories. Currently not receiving any outpatient therapy. Has subluxation. Recommend corticosteroid injection  Shoulder injection right  Without ultrasound guidance  Indication:right Shoulder pain not relieved by medication management and other conservative care.  Informed consent was obtained after describing risks and benefits of the procedure with the patient, this includes bleeding, bruising, infection and medication side effects. The patient wishes to proceed and has given written consent. Patient was placed in a seated position. The right shoulder was marked and prepped with betadine in the subacromial area. A 25-gauge 1-1/2 inch needle was inserted into the subacromial area. After negative draw back for blood, a solution containing 1  mL of 6 mg per ML betamethasone and 4 mL of 1% lidocaine was injected. A band aid was applied. The patient tolerated the procedure well. Post procedure instructions were given.

## 2014-05-27 ENCOUNTER — Telehealth: Payer: Self-pay | Admitting: Emergency Medicine

## 2014-05-29 ENCOUNTER — Other Ambulatory Visit: Payer: Self-pay | Admitting: Internal Medicine

## 2014-05-29 MED ORDER — METOPROLOL TARTRATE 100 MG PO TABS: 100.0000 mg | ORAL_TABLET | Freq: Three times a day (TID) | ORAL | Status: DC

## 2014-05-31 ENCOUNTER — Emergency Department (HOSPITAL_COMMUNITY): Payer: Medicaid Other

## 2014-05-31 ENCOUNTER — Encounter (HOSPITAL_COMMUNITY): Payer: Self-pay | Admitting: *Deleted

## 2014-05-31 ENCOUNTER — Emergency Department (HOSPITAL_COMMUNITY)
Admission: EM | Admit: 2014-05-31 | Discharge: 2014-05-31 | Disposition: A | Discharge status: Home / Self Care | Payer: Medicaid Other | Attending: Emergency Medicine | Admitting: Emergency Medicine

## 2014-05-31 DIAGNOSIS — R55 Syncope and collapse: Secondary | ICD-10-CM | POA: Insufficient documentation

## 2014-05-31 DIAGNOSIS — K5641 Fecal impaction: Secondary | ICD-10-CM | POA: Diagnosis present

## 2014-05-31 DIAGNOSIS — Z794 Long term (current) use of insulin: Secondary | ICD-10-CM | POA: Insufficient documentation

## 2014-05-31 DIAGNOSIS — E119 Type 2 diabetes mellitus without complications: Secondary | ICD-10-CM | POA: Diagnosis not present

## 2014-05-31 DIAGNOSIS — I10 Essential (primary) hypertension: Secondary | ICD-10-CM | POA: Diagnosis not present

## 2014-05-31 DIAGNOSIS — Z79899 Other long term (current) drug therapy: Secondary | ICD-10-CM | POA: Insufficient documentation

## 2014-05-31 DIAGNOSIS — F319 Bipolar disorder, unspecified: Secondary | ICD-10-CM | POA: Diagnosis not present

## 2014-05-31 DIAGNOSIS — Z7982 Long term (current) use of aspirin: Secondary | ICD-10-CM | POA: Insufficient documentation

## 2014-05-31 DIAGNOSIS — Z8673 Personal history of transient ischemic attack (TIA), and cerebral infarction without residual deficits: Secondary | ICD-10-CM | POA: Diagnosis not present

## 2014-05-31 LAB — I-STAT CHEM 8, ED
BUN: 14 mg/dL (ref 6–23)
Calcium, Ion: 1.15 mmol/L (ref 1.12–1.23)
Chloride: 101 mmol/L (ref 96–112)
Creatinine, Ser: 0.8 mg/dL (ref 0.50–1.10)
Glucose, Bld: 178 mg/dL — ABNORMAL HIGH (ref 70–99)
HCT: 41 % (ref 36.0–46.0)
Hemoglobin: 13.9 g/dL (ref 12.0–15.0)
POTASSIUM: 3.3 mmol/L — AB (ref 3.5–5.1)
SODIUM: 142 mmol/L (ref 135–145)
TCO2: 26 mmol/L (ref 0–100)

## 2014-05-31 LAB — DIFFERENTIAL
BASOS ABS: 0 10*3/uL (ref 0.0–0.1)
BASOS PCT: 0 % (ref 0–1)
Eosinophils Absolute: 0.1 10*3/uL (ref 0.0–0.7)
Eosinophils Relative: 1 % (ref 0–5)
LYMPHS PCT: 33 % (ref 12–46)
Lymphs Abs: 2.8 10*3/uL (ref 0.7–4.0)
MONO ABS: 0.5 10*3/uL (ref 0.1–1.0)
MONOS PCT: 5 % (ref 3–12)
NEUTROS ABS: 5.2 10*3/uL (ref 1.7–7.7)
Neutrophils Relative %: 61 % (ref 43–77)

## 2014-05-31 LAB — COMPREHENSIVE METABOLIC PANEL
ALBUMIN: 4.2 g/dL (ref 3.5–5.2)
ALT: 22 U/L (ref 0–35)
ANION GAP: 9 (ref 5–15)
AST: 41 U/L — AB (ref 0–37)
Alkaline Phosphatase: 51 U/L (ref 39–117)
BUN: 13 mg/dL (ref 6–23)
CO2: 31 mmol/L (ref 19–32)
CREATININE: 0.92 mg/dL (ref 0.50–1.10)
Calcium: 9.6 mg/dL (ref 8.4–10.5)
Chloride: 102 mmol/L (ref 96–112)
GFR, EST AFRICAN AMERICAN: 82 mL/min — AB (ref >90)
GFR, EST NON AFRICAN AMERICAN: 71 mL/min — AB (ref >90)
Glucose, Bld: 182 mg/dL — ABNORMAL HIGH (ref 70–99)
Potassium: 3.5 mmol/L (ref 3.5–5.1)
SODIUM: 142 mmol/L (ref 135–145)
Total Bilirubin: 0.7 mg/dL (ref 0.3–1.2)
Total Protein: 7.6 g/dL (ref 6.0–8.3)

## 2014-05-31 LAB — CBC
HCT: 38.5 % (ref 36.0–46.0)
HEMOGLOBIN: 13.3 g/dL (ref 12.0–15.0)
MCH: 30.2 pg (ref 26.0–34.0)
MCHC: 34.5 g/dL (ref 30.0–36.0)
MCV: 87.5 fL (ref 78.0–100.0)
PLATELETS: 205 10*3/uL (ref 150–400)
RBC: 4.4 MIL/uL (ref 3.87–5.11)
RDW: 12.3 % (ref 11.5–15.5)
WBC: 8.6 10*3/uL (ref 4.0–10.5)

## 2014-05-31 LAB — URINALYSIS, ROUTINE W REFLEX MICROSCOPIC
GLUCOSE, UA: NEGATIVE mg/dL
Hgb urine dipstick: NEGATIVE
KETONES UR: 15 mg/dL — AB
Leukocytes, UA: NEGATIVE
NITRITE: NEGATIVE
Protein, ur: NEGATIVE mg/dL
Specific Gravity, Urine: 1.03 (ref 1.005–1.030)
UROBILINOGEN UA: 1 mg/dL (ref 0.0–1.0)
pH: 5 (ref 5.0–8.0)

## 2014-05-31 LAB — PROTIME-INR
INR: 1.06 (ref 0.00–1.49)
Prothrombin Time: 13.9 seconds (ref 11.6–15.2)

## 2014-05-31 LAB — I-STAT TROPONIN, ED: Troponin i, poc: 0 ng/mL (ref 0.00–0.08)

## 2014-05-31 LAB — ETHANOL: Alcohol, Ethyl (B): <5 mg/dL (ref 0–9)

## 2014-05-31 LAB — APTT: aPTT: 32 seconds (ref 24–37)

## 2014-05-31 MED ORDER — MINERAL OIL RE ENEM: 1.0000 | ENEMA | Freq: Once | RECTAL | Status: DC | PRN

## 2014-05-31 MED ORDER — POLYETHYLENE GLYCOL 3350 17 GM/SCOOP PO POWD: 17.0000 g | Freq: Every day | ORAL | Status: DC

## 2014-05-31 MED ORDER — GLYCERIN (LAXATIVE) 2 G RE SUPP: 1.0000 | Freq: Once | RECTAL | Status: DC | PRN

## 2014-05-31 NOTE — ED Notes (Signed)
Last seen well last night at 2230, symptoms discovered at 1:30 am today according to her daughter, pt was on bathroom for 2 hours trying to have a BM with no success.

## 2014-05-31 NOTE — ED Notes (Signed)
Patient transported to CT 

## 2014-05-31 NOTE — ED Provider Notes (Signed)
CSN: [id number]     Arrival date & time 05/31/14  0331 History   First MD Initiated Contact with Patient 05/31/14 [phone]     Chief Complaint  Patient presents with  . Fecal Impaction     (Consider location/radiation/quality/duration/timing/severity/associated sxs/prior Treatment) HPI 54 year old female presents to the emergency department from home with complaint of altered mental status and unresponsive episode.  Patient with history of diabetes, depression, hypertension, bipolar disorder/schizophrenia, and stroke 1 year ago.  Patient lives with her daughter.  Daughter reports patient has been having problems with diarrhea alternating with constipation.  Around 10:00.  Patient was placed on the toilet prior to going to bed.  After urinating, patient complained of needing privacy to have a bowel movement.  Patient unable to have a bowel movement after 2 hours of attempting.  Daughter went to check on her mother, and was trying to encourage her to get off the toilet when her mother became unresponsive, with fluttering of the eyelids.  She called 911 and was instructed to do CPR.  Just as she was moved drawn to the floor and started chest compressions.  Her mother woke up.  EMS arrived soon afterwards and brought her to the ER.  Patient has expressive aphasia at baseline.  Daughter reports she is back to her baseline.  Patient is complaining of pain at her rectum. Past Medical History  Diagnosis Date  . Hypertension   . Depression   . Diabetes mellitus     ?type (05/29/2013)  . Bipolar affective disorder   . Schizophrenia     [name] 11/28/2007 (05/29/2013)  . Stroke 05/27/2013   Past Surgical History  Procedure Laterality Date  . Cesarean section  ~ 1987  . Tubal ligation    . Tee without cardioversion N/A 12/08/2013    Procedure: TRANSESOPHAGEAL ECHOCARDIOGRAM (TEE);  Surgeon: Candee Furbish, MD;  Location: [facility] ENDOSCOPY;  Service: Cardiovascular;  Laterality: N/A;   Family History  Problem  Relation Age of Onset  . Hypertension Mother   . Stroke Father   . Hypertension Father   . Diabetes Father    History  Substance Use Topics  . Smoking status: Never Smoker   . Smokeless tobacco: Never Used  . Alcohol Use: No   OB History    No data available     Review of Systems  Level V caveat, expressive aphasia  Allergies  Review of patient's allergies indicates no known allergies.  Home Medications   Prior to Admission medications   Medication Sig Start Date End Date Taking? Authorizing Provider  acetaminophen (TYLENOL) 325 MG tablet Take 1-2 tablets (325-650 mg total) by mouth every 4 (four) hours as needed for mild pain (available over the counter). 07/08/13  Yes Ivan Anchors Love, PA-C  amLODipine (NORVASC) 10 MG tablet Take 1 tablet (10 mg total) by mouth daily. For blood pressure 03/11/14  Yes Lance Bosch, NP  aspirin 325 MG tablet Place 1 tablet (325 mg total) into feeding tube daily. 07/08/13  Yes Ivan Anchors Love, PA-C  baclofen (LIORESAL) 10 MG tablet Take 1 tablet (10 mg total) by mouth 2 (two) times daily. To help with tone/spasms 11/07/13  Yes Lance Bosch, NP  gabapentin (NEURONTIN) 100 MG capsule Take 1 capsule (100 mg total) by mouth 3 (three) times daily. 11/07/13  Yes Lance Bosch, NP  hydrochlorothiazide (HYDRODIURIL) 25 MG tablet Take 25 mg by mouth daily.   Yes Historical Provider, MD  Insulin Glargine (LANTUS SOLOSTAR) 100 UNIT/ML Solostar  Pen Inject 10 Units into the skin 2 (two) times daily. With breakfast and supper 12/08/13  Yes Nita Sells, MD  metoprolol (LOPRESSOR) 100 MG tablet Take 1 tablet (100 mg total) by mouth 3 (three) times daily. 05/29/14  Yes Lance Bosch, NP  simvastatin (ZOCOR) 20 MG tablet Take 20 mg by mouth daily at 6 PM.   Yes Historical Provider, MD  glycerin adult (GLYCERIN ADULT) 2 G SUPP Place 1 suppository rectally once as needed for moderate constipation. 05/31/14   Linton Flemings, MD  Insulin Pen Needle 31G X 6 MM MISC Use as  prescribed by physician 03/03/14   Lance Bosch, NP  metroNIDAZOLE (FLAGYL) 500 MG tablet Take 1 tablet (500 mg total) by mouth 2 (two) times daily. Patient not taking: Reported on 05/31/2014 04/02/14   Lance Bosch, NP  mineral oil enema Place 133 mLs (1 enema total) rectally once as needed for severe constipation. 05/31/14   Linton Flemings, MD  polyethylene glycol powder (GLYCOLAX/MIRALAX) powder Take 17 g by mouth daily. 05/31/14   Linton Flemings, MD   BP 131/78 mmHg  Pulse 74  Temp(Src) 97.5 F (36.4 C) (Oral)  Resp 22  SpO2 100% Physical Exam  Constitutional: She is oriented to person, place, and time. She appears well-developed and well-nourished. She appears distressed.  HENT:  Head: Normocephalic and atraumatic.  Right Ear: External ear normal.  Left Ear: External ear normal.  Nose: Nose normal.  Mouth/Throat: Oropharynx is clear and moist.  Eyes: Conjunctivae and EOM are normal. Pupils are equal, round, and reactive to light.  Neck: Normal range of motion. Neck supple. No JVD present. No tracheal deviation present. No thyromegaly present.  Cardiovascular: Normal rate, regular rhythm, normal heart sounds and intact distal pulses.  Exam reveals no gallop and no friction rub.   No murmur heard. Pulmonary/Chest: Effort normal and breath sounds normal. No stridor. No respiratory distress. She has no wheezes. She has no rales. She exhibits no tenderness.  Abdominal: Soft. Bowel sounds are normal. She exhibits no distension and no mass. There is no tenderness. There is no rebound and no guarding.  Genitourinary:  Patient with large stool ball at the anus.  Disimpaction completed with some soft stool still in the rectum  Musculoskeletal: Normal range of motion. She exhibits no edema or tenderness.  Patient with contractures of the right arm and leg  Lymphadenopathy:    She has no cervical adenopathy.  Neurological: She is alert and oriented to person, place, and time. She displays normal  reflexes. A cranial nerve deficit (Facial droop at baseline) is present. She exhibits normal muscle tone. Coordination abnormal.  Expressive aphasia  Skin: Skin is warm and dry. No rash noted. No erythema. No pallor.  Psychiatric: She has a normal mood and affect. Her behavior is normal. Judgment and thought content normal.    ED Course  Fecal disimpaction Date/Time: 05/31/2014 8:29 AM Performed by: Linton Flemings Authorized by: Linton Flemings Consent: Verbal consent obtained. Local anesthesia used: no Patient sedated: no Patient tolerance: Patient tolerated the procedure well with no immediate complications Comments: Using gentle pressure.  A large amount of stool was expressed from the anus.  Using 2 fingers, 4 handfuls of hard stool were removed from the rectum.  Patient tolerated procedure fairly.   (including critical care time) Labs Review Labs Reviewed  COMPREHENSIVE METABOLIC PANEL - Abnormal; Notable for the following:    Glucose, Bld 182 (*)    AST 41 (*)  GFR calc non Af Amer 71 (*)    GFR calc Af Amer 82 (*)    All other components within normal limits  URINALYSIS, ROUTINE W REFLEX MICROSCOPIC - Abnormal; Notable for the following:    Color, Urine AMBER (*)    Bilirubin Urine SMALL (*)    Ketones, ur 15 (*)    All other components within normal limits  I-STAT CHEM 8, ED - Abnormal; Notable for the following:    Potassium 3.3 (*)    Glucose, Bld 178 (*)    All other components within normal limits  ETHANOL  PROTIME-INR  APTT  CBC  DIFFERENTIAL  I-STAT TROPOININ, ED  Randolm Idol, ED    Imaging Review Dg Abd 1 View  05/31/2014   CLINICAL DATA:  Fecal impaction.  EXAM: ABDOMEN - 1 VIEW  COMPARISON:  06/17/2013  FINDINGS: Moderate stool in the right colon. Small amount of stool in the descending and sigmoid colon. There is no large stool ball in the rectum. No dilated small bowel loops. Minimal gaseous gastric distention. Round calcification in the left pelvis,  likely phlebolith. No evidence of free air. No acute osseous abnormaliy tis seen.  IMPRESSION: Small to moderate volume of colonic stool. No radiographic findings to suggest fecal impaction.   Electronically Signed   By: Jeb Levering M.D.   On: 05/31/2014 05:52   Ct Head Wo Contrast  05/31/2014   CLINICAL DATA:  Altered mental status.  Fecal impaction.  EXAM: CT HEAD WITHOUT CONTRAST  TECHNIQUE: Contiguous axial images were obtained from the base of the skull through the vertex without intravenous contrast.  COMPARISON:  Head CT 03/17/2014  FINDINGS: Remote left MCA distribution infarct with extensive encephalomalacia, unchanged. No intracranial hemorrhage, mass effect, or midline shift. Unchanged ventricular system with ex vacuo dilatation of the left lateral ventricle. The basilar cisterns are patent. No intracranial fluid collection. Calvarium is intact. Included paranasal sinuses and mastoid air cells are well aerated.  IMPRESSION: 1.  No acute intracranial abnormality. 2. Remote left MCA distribution infarct with encephalomalacia, unchanged.   Electronically Signed   By: Jeb Levering M.D.   On: 05/31/2014 06:20     EKG Interpretation   Date/Time:  Sunday May 31 2014 03:46:44 EDT Ventricular Rate:  78 PR Interval:  147 QRS Duration: 75 QT Interval:  409 QTC Calculation: 466 R Axis:   61 Text Interpretation:  Sinus rhythm Consider left atrial enlargement  Anteroseptal infarct, old Confirmed by Chauntel Windsor  MD, Anjela Cassara ([id number]) on  05/31/2014 4:58:33 AM      MDM   Final diagnoses:  Syncope, vasovagal  Fecal impaction in rectum    54 year old female with syncopal episode, most likely secondary to vasovagal from straining and having a stool.  Patient with fecal impaction.  Upon arrival, disimpacted.  Workup here, otherwise unremarkable.  Daughter is comfortable taking her home.  She has good follow-up established.    Linton Flemings, MD 05/31/14 0830

## 2014-05-31 NOTE — ED Notes (Signed)
Pt to ED from home via [facility] c/o constipation. Pt was sitting on the commode for an extended period of time at home. Daughter reported when she went to check on patient, the pt's eyes were rolled backwards and not responding well to commands. Daughter was going to start compression when the pt became alert. Hx of a stroke with R sided deficits. Pt c/o rectal pain on assessment. Bowel sounds present. CMG 122; BP 121/79; HR 97; spO2 98% on RA

## 2014-05-31 NOTE — Discharge Instructions (Signed)
Increase water and fiber intake.  Follow-up with your primary care doctor.  Restart Mira lax.  Use glycerin suppositories as needed for obstipation.  Mineral oil enemas as needed for severe constipation.    Constipation Constipation is when a person has fewer than three bowel movements a week, has difficulty having a bowel movement, or has stools that are dry, hard, or larger than normal. As people grow older, constipation is more common. If you try to fix constipation with medicines that make you have a bowel movement (laxatives), the problem may get worse. Long-term laxative use may cause the muscles of the colon to become weak. A low-fiber diet, not taking in enough fluids, and taking certain medicines may make constipation worse.  CAUSES   Certain medicines, such as antidepressants, pain medicine, iron supplements, antacids, and water pills.   Certain diseases, such as diabetes, irritable bowel syndrome (IBS), thyroid disease, or depression.   Not drinking enough water.   Not eating enough fiber-rich foods.   Stress or travel.   Lack of physical activity or exercise.   Ignoring the urge to have a bowel movement.   Using laxatives too much.  SIGNS AND SYMPTOMS   Having fewer than three bowel movements a week.   Straining to have a bowel movement.   Having stools that are hard, dry, or larger than normal.   Feeling full or bloated.   Pain in the lower abdomen.   Not feeling relief after having a bowel movement.  DIAGNOSIS  Your health care provider will take a medical history and perform a physical exam. Further testing may be done for severe constipation. Some tests may include:  A barium enema X-ray to examine your rectum, colon, and, sometimes, your small intestine.   A sigmoidoscopy to examine your lower colon.   A colonoscopy to examine your entire colon. TREATMENT  Treatment will depend on the severity of your constipation and what is causing it.  Some dietary treatments include drinking more fluids and eating more fiber-rich foods. Lifestyle treatments may include regular exercise. If these diet and lifestyle recommendations do not help, your health care provider may recommend taking over-the-counter laxative medicines to help you have bowel movements. Prescription medicines may be prescribed if over-the-counter medicines do not work.  HOME CARE INSTRUCTIONS   Eat foods that have a lot of fiber, such as fruits, vegetables, whole grains, and beans.  Limit foods high in fat and processed sugars, such as french fries, hamburgers, cookies, candies, and soda.   A fiber supplement may be added to your diet if you cannot get enough fiber from foods.   Drink enough fluids to keep your urine clear or pale yellow.   Exercise regularly or as directed by your health care provider.   Go to the restroom when you have the urge to go. Do not hold it.   Only take over-the-counter or prescription medicines as directed by your health care provider. Do not take other medicines for constipation without talking to your health care provider first.  Paint IF:   You have bright red blood in your stool.   Your constipation lasts for more than 4 days or gets worse.   You have abdominal or rectal pain.   You have thin, pencil-like stools.   You have unexplained weight loss. MAKE SURE YOU:   Understand these instructions.  Will watch your condition.  Will get help right away if you are not doing well or get worse. Document  Released: 12/03/2003 Document Revised: 03/11/2013 Document Reviewed: 12/16/2012 [facility] Patient Information 2015 [place], [place]. This information is not intended to replace advice given to you by your health care provider. Make sure you discuss any questions you have with your health care provider.  Fecal Impaction A fecal impaction happens when there is a large, firm amount of stool (or feces)  that cannot be passed. The impacted stool is usually in the rectum, which is the lowest part of the large bowel. The impacted stool can block the colon and cause significant problems. CAUSES  The longer stool stays in the rectum, the harder it gets. Anything that slows down your bowel movements can lead to fecal impaction, such as:  Constipation. This can be a long-standing (chronic) problem or can happen suddenly (acute).  Painful conditions of the rectum, such as hemorrhoids or anal fissures. The pain of these conditions can make you try to avoid having bowel movements.  Narcotic pain-relieving medicines, such as methadone, morphine, or codeine.  Not drinking enough fluids.  Inactivity and bed rest over long periods of time.  Diseases of the brain or nervous system that damage the nerves controlling the muscles of the intestines. SIGNS AND SYMPTOMS   Lack of normal bowel movements or changes in bowel patterns.  Sense of fullness in the rectum but unable to pass stool.  Pain or cramps in the abdominal area (often after meals).  Thin, watery discharge from the rectum. DIAGNOSIS  Your health care provider may suspect that you have a fecal impaction based on your symptoms and a physical exam. This will include an exam of your rectum. Sometimes X-rays or lab testing may be needed to confirm the diagnosis and to be sure there are no other problems.  TREATMENT   Initially an impaction can be removed manually. Using a gloved finger, your health care provider can remove hard stool from your rectum.  Medicine is sometimes needed. A suppository or enema can be given in the rectum to soften the stool, which can stimulate a bowel movement. Medicines can also be given by mouth (orally).  Though rare, surgery may be needed if the colon has torn (perforated) due to blockage. HOME CARE INSTRUCTIONS   Develop regular bowel habits. This could include getting in the habit of having a bowel movement  after your morning cup of coffee or after eating. Be sure to allow yourself enough time on the toilet.  Maintain a high-fiber diet.  Drink enough fluids to keep your urine clear or pale yellow as directed by your health care provider.  Exercise regularly.  If you begin to get constipated, increase the amount of fiber in your diet. Eat plenty of fruits, vegetables, whole wheat breads, bran, oatmeal, and similar products.  Take natural fiber laxatives or other laxatives only as directed by your health care provider. SEEK MEDICAL CARE IF:   You have ongoing rectal pain.  You require enemas or suppositories more than twice a week.  You have rectal bleeding.  You have continued problems, or you develop abdominal pain.  You have thin, pencil-like stools. SEEK IMMEDIATE MEDICAL CARE IF:  You have black or tarry stools. MAKE SURE YOU:   Understand these instructions.  Will watch your condition.  Will get help right away if you are not doing well or get worse. Document Released: 11/27/2003 Document Revised: 12/25/2012 Document Reviewed: 09/10/2012 [facility] Patient Information 2015 [place], [place]. This information is not intended to replace advice given to you by your health care  provider. Make sure you discuss any questions you have with your health care provider.    Vasovagal Syncope, Adult Syncope, commonly known as fainting, is a temporary loss of consciousness. It occurs when the blood flow to the brain is reduced. Vasovagal syncope (also called neurocardiogenic syncope) is a fainting spell in which the blood flow to the brain is reduced because of a sudden drop in heart rate and blood pressure. Vasovagal syncope occurs when the brain and the cardiovascular system (blood vessels) do not adequately communicate and respond to each other. This is the most common cause of fainting. It often occurs in response to fear or some other type of emotional or physical stress. The body has a  reaction in which the heart starts beating too slowly or the blood vessels expand, reducing blood pressure. This type of fainting spell is generally considered harmless. However, injuries can occur if a person takes a sudden fall during a fainting spell.  CAUSES  Vasovagal syncope occurs when a person's blood pressure and heart rate decrease suddenly, usually in response to a trigger. Many things and situations can trigger an episode. Some of these include:   Pain.   Fear.   The sight of blood or medical procedures, such as blood being drawn from a vein.   Common activities, such as coughing, swallowing, stretching, or going to the bathroom.   Emotional stress.   Prolonged standing, especially in a warm environment.   Lack of sleep or rest.   Prolonged lack of food.   Prolonged lack of fluids.   Recent illness.  The use of certain drugs that affect blood pressure, such as cocaine, alcohol, marijuana, inhalants, and opiates.  SYMPTOMS  Before the fainting episode, you may:   Feel dizzy or light headed.   Become pale.  Sense that you are going to faint.   Feel like the room is spinning.   Have tunnel vision, only seeing directly in front of you.   Feel sick to your stomach (nauseous).   See spots or slowly lose vision.   Hear ringing in your ears.   Have a headache.   Feel warm and sweaty.   Feel a sensation of pins and needles. During the fainting spell, you will generally be unconscious for no longer than a couple minutes before waking up and returning to normal. If you get up too quickly before your body can recover, you may faint again. Some twitching or jerky movements may occur during the fainting spell.  DIAGNOSIS  Your caregiver will ask about your symptoms, take a medical history, and perform a physical exam. Various tests may be done to rule out other causes of fainting. These may include blood tests and tests to check the heart, such as  electrocardiography, echocardiography, and possibly an electrophysiology study. When other causes have been ruled out, a test may be done to check the body's response to changes in position (tilt table test). TREATMENT  Most cases of vasovagal syncope do not require treatment. Your caregiver may recommend ways to avoid fainting triggers and may provide home strategies for preventing fainting. If you must be exposed to a possible trigger, you can drink additional fluids to help reduce your chances of having an episode of vasovagal syncope. If you have warning signs of an oncoming episode, you can respond by positioning yourself favorably (lying down). If your fainting spells continue, you may be given medicines to prevent fainting. Some medicines may help make you more resistant to repeated  episodes of vasovagal syncope. Special exercises or compression stockings may be recommended. In rare cases, the surgical placement of a pacemaker is considered. HOME CARE INSTRUCTIONS   Learn to identify the warning signs of vasovagal syncope.   Sit or lie down at the first warning sign of a fainting spell. If sitting, put your head down between your legs. If you lie down, swing your legs up in the air to increase blood flow to the brain.   Avoid hot tubs and saunas.  Avoid prolonged standing.  Drink enough fluids to keep your urine clear or pale yellow. Avoid caffeine.  Increase salt in your diet as directed by your caregiver.   If you have to stand for a long time, perform movements such as:   Crossing your legs.   Flexing and stretching your leg muscles.   Squatting.   Moving your legs.   Bending over.   Only take over-the-counter or prescription medicines as directed by your caregiver. Do not suddenly stop any medicines without asking your caregiver first. SEEK MEDICAL CARE IF:   Your fainting spells continue or happen more frequently in spite of treatment.   You lose  consciousness for more than a couple minutes.  You have fainting spells during or after exercising or after being startled.   You have new symptoms that occur with the fainting spells, such as:   Shortness of breath.  Chest pain.   Irregular heartbeat.   You have episodes of twitching or jerky movements that last longer than a few seconds.  You have episodes of twitching or jerky movements without obvious fainting. SEEK IMMEDIATE MEDICAL CARE IF:   You have injuries or bleeding after a fainting spell.   You have episodes of twitching or jerky movements that last longer than 5 minutes.   You have more than one spell of twitching or jerky movements before returning to consciousness after fainting. MAKE SURE YOU:   Understand these instructions.  Will watch your condition.  Will get help right away if you are not doing well or get worse. Document Released: 02/21/2012 Document Reviewed: 02/21/2012 [facility] Patient Information 2015 [place]. This information is not intended to replace advice given to you by your health care provider. Make sure you discuss any questions you have with your health care provider.

## 2014-06-01 ENCOUNTER — Encounter: Payer: Self-pay | Admitting: Internal Medicine

## 2014-06-01 ENCOUNTER — Ambulatory Visit (INDEPENDENT_AMBULATORY_CARE_PROVIDER_SITE_OTHER): Payer: Medicaid Other | Admitting: Internal Medicine

## 2014-06-01 VITALS — BP 120/62 | HR 59 | Ht 65.0 in | Wt 109.4 lb

## 2014-06-01 DIAGNOSIS — I6340 Cerebral infarction due to embolism of unspecified cerebral artery: Secondary | ICD-10-CM

## 2014-06-01 DIAGNOSIS — I10 Essential (primary) hypertension: Secondary | ICD-10-CM

## 2014-06-01 NOTE — Progress Notes (Signed)
ELECTROPHYSIOLOGY CONSULT NOTE  Patient ID: [name] MRN: [id number], DOB/AGE: Aug 30, 1960   Admit date: (Not on file) Date of Consult: 06/01/2014  Primary Physician: Chari Manning, NP  Reason for Consultation: Cryptogenic stroke   History of Present Illness [name] was admitted on 3/15 with acute CVA. she has been monitored on telemetry which has demonstrated no arrhythmias. No cause has been identified. Inpatient stroke work-up did not reveals cause for her stroke.  She has made slow improvements and continues to have significant residual deficit.  She has rare palpitations.  EP has been asked to evaluate for placement of an implantable loop recorder to monitor for atrial fibrillation.  Past Medical History Past Medical History  Diagnosis Date  . Hypertension   . Depression   . Diabetes mellitus     ?type (05/29/2013)  . Bipolar affective disorder   . Schizophrenia     [name] 11/28/2007 (05/29/2013)  . Stroke 05/27/2013    Past Surgical History Past Surgical History  Procedure Laterality Date  . Cesarean section  ~ 1987  . Tubal ligation    . Tee without cardioversion N/A 12/08/2013    Procedure: TRANSESOPHAGEAL ECHOCARDIOGRAM (TEE);  Surgeon: Candee Furbish, MD;  Location: [facility] ENDOSCOPY;  Service: Cardiovascular;  Laterality: N/A;    Allergies/Intolerances No Known Allergies Inpatient Medications   Social History History   Social History  . Marital Status: Single    Spouse Name: N/A  . Number of Children: 3  . Years of Education: 12   Occupational History  . disabled    Social History Main Topics  . Smoking status: Never Smoker   . Smokeless tobacco: Never Used  . Alcohol Use: No  . Drug Use: No  . Sexual Activity: Not on file   Other Topics Concern  . Not on file   Social History Narrative   Patient is single with 3 children.   Patient is right handed.   Patient has hs education.   Patient does not drink caffeine.    Review of  Systems General: No chills, fever, night sweats or weight changes  Cardiovascular:  No chest pain, dyspnea on exertion, edema, orthopnea, palpitations, paroxysmal nocturnal dyspnea Dermatological: No rash, lesions or masses Respiratory: No cough, dyspnea Urologic: No hematuria, dysuria Abdominal: No nausea, vomiting, diarrhea, bright red blood per rectum, melena, or hematemesis Neurologic: No visual changes, weakness, changes in mental status All other systems reviewed and are otherwise negative except as noted above.  Physical Exam Blood pressure 120/62, pulse 59, height 5\' 5"  (4.270 m), weight 109 lb 6.4 oz (49.624 kg).  General: thin,  54 y.o. female in no acute distress.  In a wheel chair today HEENT: Normocephalic, atraumatic. EOMs intact. Sclera nonicteric. Oropharynx clear.  Neck: Supple without bruits. No JVD. Lungs: Respirations regular and unlabored, CTA bilaterally. No wheezes, rales or rhonchi. Heart: RRR. S1, S2 present. No murmurs, rub, S3 or S4. Abdomen: Soft, non-tender, non-distended. BS present x 4 quadrants. No hepatosplenomegaly.  Extremities: No clubbing, cyanosis or edema. DP/PT/Radials 2+ and equal bilaterally. Psych: Normal affect. Neuro: Alert and oriented X 3. Significant R sided weakness, L facial droop Musculoskeletal: No kyphosis. Skin: Intact. Warm and dry. No rashes or petechiae in exposed areas.   Labs Lab Results  Component Value Date   WBC 8.6 05/31/2014   HGB 13.9 05/31/2014   HCT 41.0 05/31/2014   MCV 87.5 05/31/2014   PLT 205 05/31/2014    Recent Labs Lab 05/31/14 0405 05/31/14 0409  NA 142 142  K 3.5 3.3*  CL 102 101  CO2 31  --   BUN 13 14  CREATININE 0.92 0.80  CALCIUM 9.6  --   PROT 7.6  --   BILITOT 0.7  --   ALKPHOS 51  --   ALT 22  --   AST 41*  --   GLUCOSE 182* 178*    Recent Labs  05/31/14 0405  INR 1.06    Radiology/Studies Dg Abd 1 View  05/31/2014   CLINICAL DATA:  Fecal impaction.  EXAM: ABDOMEN - 1 VIEW   COMPARISON:  06/17/2013  FINDINGS: Moderate stool in the right colon. Small amount of stool in the descending and sigmoid colon. There is no large stool ball in the rectum. No dilated small bowel loops. Minimal gaseous gastric distention. Round calcification in the left pelvis, likely phlebolith. No evidence of free air. No acute osseous abnormaliy tis seen.  IMPRESSION: Small to moderate volume of colonic stool. No radiographic findings to suggest fecal impaction.   Electronically Signed   By: Jeb Levering M.D.   On: 05/31/2014 05:52   Ct Head Wo Contrast  05/31/2014   CLINICAL DATA:  Altered mental status.  Fecal impaction.  EXAM: CT HEAD WITHOUT CONTRAST  TECHNIQUE: Contiguous axial images were obtained from the base of the skull through the vertex without intravenous contrast.  COMPARISON:  Head CT 03/17/2014  FINDINGS: Remote left MCA distribution infarct with extensive encephalomalacia, unchanged. No intracranial hemorrhage, mass effect, or midline shift. Unchanged ventricular system with ex vacuo dilatation of the left lateral ventricle. The basilar cisterns are patent. No intracranial fluid collection. Calvarium is intact. Included paranasal sinuses and mastoid air cells are well aerated.  IMPRESSION: 1.  No acute intracranial abnormality. 2. Remote left MCA distribution infarct with encephalomalacia, unchanged.   Electronically Signed   By: Jeb Levering M.D.   On: 05/31/2014 06:20    Echocardiogram  reviewed  12-lead ECG sinus rhythm   Assessment and Plan 1. Cryptogenic stroke  I would recommend loop recorder insertion to monitor for AF. The indication for loop recorder insertion / 30 day monitoring for AF in setting of cryptogenic stroke was discussed with the patient. The loop recorder insertion procedure was reviewed in detail including risks and benefits. These risks include but are not limited to bleeding and infection. Unfortunately she is very clear in her decision to decline  implantable loop recorder at this time.  2. HTN Stable No change required today  No further EP workup or management is planned.  She is aware to contact my office if she would like to reconsider implantable loop recorder placement in the future.    Army Fossa 06/01/2014, 11:25 AM

## 2014-06-01 NOTE — Progress Notes (Signed)
I agree and will discuss this with Dr. Erlinda Hong

## 2014-06-01 NOTE — Patient Instructions (Signed)
Your physician recommends that you schedule a follow-up appointment as needed with Dr Allred   

## 2014-06-04 ENCOUNTER — Encounter: Payer: Self-pay | Admitting: Physical Medicine & Rehabilitation

## 2014-06-04 ENCOUNTER — Encounter: Payer: Medicaid Other | Attending: Physical Medicine & Rehabilitation

## 2014-06-04 ENCOUNTER — Ambulatory Visit (HOSPITAL_BASED_OUTPATIENT_CLINIC_OR_DEPARTMENT_OTHER): Payer: Medicaid Other | Admitting: Physical Medicine & Rehabilitation

## 2014-06-04 VITALS — BP 113/81 | HR 58 | Resp 14

## 2014-06-04 DIAGNOSIS — R482 Apraxia: Secondary | ICD-10-CM

## 2014-06-04 DIAGNOSIS — G8110 Spastic hemiplegia affecting unspecified side: Secondary | ICD-10-CM

## 2014-06-04 DIAGNOSIS — I69320 Aphasia following cerebral infarction: Secondary | ICD-10-CM

## 2014-06-04 DIAGNOSIS — I69390 Apraxia following cerebral infarction: Secondary | ICD-10-CM

## 2014-06-04 DIAGNOSIS — I63312 Cerebral infarction due to thrombosis of left middle cerebral artery: Secondary | ICD-10-CM

## 2014-06-04 DIAGNOSIS — IMO0002 Reserved for concepts with insufficient information to code with codable children: Secondary | ICD-10-CM

## 2014-06-04 DIAGNOSIS — I63512 Cerebral infarction due to unspecified occlusion or stenosis of left middle cerebral artery: Secondary | ICD-10-CM

## 2014-06-04 DIAGNOSIS — M7501 Adhesive capsulitis of right shoulder: Secondary | ICD-10-CM

## 2014-06-04 NOTE — Patient Instructions (Signed)
Please call if you decide to have the Botox or Dysport injection. This would be several injections into the right forearm. This would relieve the tight finger and wrist flexors of the right hand and help relieve pain.  Also please call if you decide to have a repeat shoulder injection

## 2014-06-04 NOTE — Progress Notes (Signed)
Subjective:    Patient ID: [name], female    DOB: [birth year], 54 y.o.   MRN: [id number]  HPI 54 year old female with large left MCA distribution infarct 05/27/2013. She went through inpatient rehabilitation followed by skilled nursing facility. Patient has been at home, daughter assists her, there is also a CNA 4 hours per day. Patient continues have shoulder pain even though she did get at least a temporary relief with right shoulder injection. She does not wish to have another one today. Patient continues with clenched fist on the right side. Has been evaluated for Botox and has decided against this. She will be starting her outpatient PT, OT, speech next week  Pain Inventory Average Pain 2 Pain Right Now 2 My pain is aching  In the last 24 hours, has pain interfered with the following? General activity 3 Relation with others 0 Enjoyment of life 0 What TIME of day is your pain at its worst? varies, sitting down Sleep (in general) Fair  Pain is worse with: sitting Pain improves with: rest Relief from Meds: 0  Mobility walk with assistance use a walker how many minutes can you walk? 2-3 ability to climb steps?  no do you drive?  no use a wheelchair  Function disabled: date disabled .  Neuro/Psych bowel control problems trouble walking spasms  Prior Studies Any changes since last visit?  yes ER visit, loss of conciousness  Physicians involved in your care Any changes since last visit?  yes Dr. Einar Pheasant  Cardiologist   Family History  Problem Relation Age of Onset  . Hypertension Mother   . Stroke Father   . Hypertension Father   . Diabetes Father    History   Social History  . Marital Status: Single    Spouse Name: N/A  . Number of Children: 3  . Years of Education: 12   Occupational History  . disabled    Social History Main Topics  . Smoking status: Never Smoker   . Smokeless tobacco: Never Used  . Alcohol Use: No  . Drug Use: No    . Sexual Activity: Not on file   Other Topics Concern  . None   Social History Narrative   Patient is single with 3 children.   Patient is right handed.   Patient has hs education.   Patient does not drink caffeine.   Past Surgical History  Procedure Laterality Date  . Cesarean section  ~ 1987  . Tubal ligation    . Tee without cardioversion N/A 12/08/2013    Procedure: TRANSESOPHAGEAL ECHOCARDIOGRAM (TEE);  Surgeon: Candee Furbish, MD;  Location: [facility] ENDOSCOPY;  Service: Cardiovascular;  Laterality: N/A;   Past Medical History  Diagnosis Date  . Hypertension   . Depression   . Diabetes mellitus     ?type (05/29/2013)  . Bipolar affective disorder   . Schizophrenia     [name] 11/28/2007 (05/29/2013)  . Stroke 05/27/2013   There were no vitals taken for this visit.  Opioid Risk Score:   Fall Risk Score: Low Fall Risk (0-5 points)  Review of Systems  Gastrointestinal: Positive for diarrhea and constipation.  Musculoskeletal: Positive for gait problem.  Neurological:       Spasms  All other systems reviewed and are negative.      Objective:   Physical Exam  Constitutional: She is oriented to person, place, and time. She appears well-developed and well-nourished.  HENT:  Head: Normocephalic and atraumatic.  Eyes: Conjunctivae and EOM are  normal. Pupils are equal, round, and reactive to light.  Neck: Normal range of motion.  Neurological: She is alert and oriented to person, place, and time.  Evidence of apraxia has difficulty and needs hand over hand cueing for left lower extremity Difficulty with manual muscle testing secondary to apraxia  Right upper extremity strength trace at the deltoid and biceps and 0 at the finger flexors and extensors  Ashworth grade 3 spasticity at the right finger flexors and wrist flexors.  Sensation reduced in the right upper extremity With yes no responses can indicate that both lower extremities feel about the same  Patient evidence of  expressive and receptive language deficits  No visual field cut noted   Psychiatric: She has a normal mood and affect.  Nursing note and vitals reviewed.         Assessment & Plan:  1. 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 She would benefit from Botox injection right finger flexors and wrist flexors but she declines at the current time She would benefit from PT OT and speech as an outpatient and will be starting this next week  Has neurology follow-up in 1 month Recommend PM&R follow-up in 6 weeks  2. Right adhesive capsulitis of the shoulder limited range of motion in all planes. Will have outpatient OT, repeat injection would be helpful. May benefit from right suprascapular nerve block as well    3.

## 2014-06-09 ENCOUNTER — Ambulatory Visit: Payer: Medicaid Other

## 2014-06-09 ENCOUNTER — Ambulatory Visit: Payer: Medicaid Other | Admitting: Occupational Therapy

## 2014-06-09 ENCOUNTER — Encounter: Payer: Self-pay | Admitting: Internal Medicine

## 2014-06-11 ENCOUNTER — Ambulatory Visit: Payer: Medicaid Other | Admitting: Internal Medicine

## 2014-06-18 ENCOUNTER — Ambulatory Visit: Payer: Medicaid Other | Admitting: Internal Medicine

## 2014-06-23 ENCOUNTER — Encounter: Payer: Self-pay | Admitting: Internal Medicine

## 2014-06-23 ENCOUNTER — Ambulatory Visit: Payer: Medicaid Other | Attending: Internal Medicine | Admitting: Internal Medicine

## 2014-06-23 ENCOUNTER — Ambulatory Visit: Payer: Medicaid Other | Attending: Physical Medicine & Rehabilitation

## 2014-06-23 VITALS — BP 144/87 | HR 73 | Temp 98.0°F | Resp 15

## 2014-06-23 DIAGNOSIS — K5641 Fecal impaction: Secondary | ICD-10-CM | POA: Diagnosis not present

## 2014-06-23 DIAGNOSIS — R55 Syncope and collapse: Secondary | ICD-10-CM | POA: Insufficient documentation

## 2014-06-23 DIAGNOSIS — Z794 Long term (current) use of insulin: Secondary | ICD-10-CM | POA: Insufficient documentation

## 2014-06-23 DIAGNOSIS — I10 Essential (primary) hypertension: Secondary | ICD-10-CM | POA: Insufficient documentation

## 2014-06-23 DIAGNOSIS — E119 Type 2 diabetes mellitus without complications: Secondary | ICD-10-CM | POA: Insufficient documentation

## 2014-06-23 DIAGNOSIS — F319 Bipolar disorder, unspecified: Secondary | ICD-10-CM | POA: Insufficient documentation

## 2014-06-23 NOTE — Patient Instructions (Signed)
Fecal Impaction °A fecal impaction happens when there is a large, firm amount of stool (or feces) that cannot be passed. The impacted stool is usually in the rectum, which is the lowest part of the large bowel. The impacted stool can block the colon and cause significant problems. °CAUSES  °The longer stool stays in the rectum, the harder it gets. Anything that slows down your bowel movements can lead to fecal impaction, such as: °· Constipation. This can be a long-standing (chronic) problem or can happen suddenly (acute). °· Painful conditions of the rectum, such as hemorrhoids or anal fissures. The pain of these conditions can make you try to avoid having bowel movements. °· Narcotic pain-relieving medicines, such as methadone, morphine, or codeine. °· Not drinking enough fluids. °· Inactivity and bed rest over long periods of time. °· Diseases of the brain or nervous system that damage the nerves controlling the muscles of the intestines. °SIGNS AND SYMPTOMS  °· Lack of normal bowel movements or changes in bowel patterns. °· Sense of fullness in the rectum but unable to pass stool. °· Pain or cramps in the abdominal area (often after meals). °· Thin, watery discharge from the rectum. °DIAGNOSIS  °Your health care provider may suspect that you have a fecal impaction based on your symptoms and a physical exam. This will include an exam of your rectum. Sometimes X-rays or lab testing may be needed to confirm the diagnosis and to be sure there are no other problems.  °TREATMENT  °· Initially an impaction can be removed manually. Using a gloved finger, your health care provider can remove hard stool from your rectum. °· Medicine is sometimes needed. A suppository or enema can be given in the rectum to soften the stool, which can stimulate a bowel movement. Medicines can also be given by mouth (orally). °· Though rare, surgery may be needed if the colon has torn (perforated) due to blockage. °HOME CARE INSTRUCTIONS   °· Develop regular bowel habits. This could include getting in the habit of having a bowel movement after your morning cup of coffee or after eating. Be sure to allow yourself enough time on the toilet. °· Maintain a high-fiber diet. °· Drink enough fluids to keep your urine clear or pale yellow as directed by your health care provider. °· Exercise regularly. °· If you begin to get constipated, increase the amount of fiber in your diet. Eat plenty of fruits, vegetables, whole wheat breads, bran, oatmeal, and similar products. °· Take natural fiber laxatives or other laxatives only as directed by your health care provider. °SEEK MEDICAL CARE IF:  °· You have ongoing rectal pain. °· You require enemas or suppositories more than twice a week. °· You have rectal bleeding. °· You have continued problems, or you develop abdominal pain. °· You have thin, pencil-like stools. °SEEK IMMEDIATE MEDICAL CARE IF:  °You have black or tarry stools. °MAKE SURE YOU:  °· Understand these instructions. °· Will watch your condition. °· Will get help right away if you are not doing well or get worse. °Document Released: 11/27/2003 Document Revised: 12/25/2012 Document Reviewed: 09/10/2012 °ExitCare® Patient Information ©2015 [employer]. This information is not intended to replace advice given to you by your health care provider. Make sure you discuss any questions you have with your health care provider. ° °

## 2014-06-23 NOTE — Progress Notes (Signed)
Patient here for follow up from the ED Patient stated she was constipated and had a syncope episode Family called EMS and was evaluated at the hospital

## 2014-06-23 NOTE — Progress Notes (Addendum)
Patient ID: [name], female   DOB: [birth year], 54 y.o.   MRN: [id number]  CC: ED f/u  ER NOTE 54 year old female presents to the emergency department from home with complaint of altered mental status and unresponsive episode. Patient with history of diabetes, depression, hypertension, bipolar disorder/schizophrenia, and stroke 1 year ago. Patient lives with her daughter. Daughter reports patient has been having problems with diarrhea alternating with constipation. Around 10:00. Patient was placed on the toilet prior to going to bed. After urinating, patient complained of needing privacy to have a bowel movement. Patient unable to have a bowel movement after 2 hours of attempting. Daughter went to check on her mother, and was trying to encourage her to get off the toilet when her mother became unresponsive, with fluttering of the eyelids. She called 911 and was instructed to do CPR. Just as she was moved drawn to the floor and started chest compressions. Her mother woke up. EMS arrived soon afterwards and brought her to the ER. Patient has expressive aphasia at baseline. Daughter reports she is back to her baseline. Patient is complaining of pain at her rectum.  Today  Patient presents today for a ED follow up. She presents with her daughter who is her primary caregiver. Her daughter reports that she has not had any more syncopal episodes since ER visit. Her daughter continues to report that patient is losing a substantial amount of weight although she is eating what is considered normal for her. Patient also continues to have episodes of alternating diarrhea and constipation.   Patient has No headache, No chest pain, No abdominal pain - No Nausea, No new weakness tingling or numbness, No Cough - SOB.  No Known Allergies Past Medical History  Diagnosis Date  . Hypertension   . Depression   . Diabetes mellitus     ?type (05/29/2013)  . Bipolar affective disorder   .  Schizophrenia     [name] 11/28/2007 (05/29/2013)  . Stroke 05/27/2013   Current Outpatient Prescriptions on File Prior to Visit  Medication Sig Dispense Refill  . acetaminophen (TYLENOL) 325 MG tablet Take 1-2 tablets (325-650 mg total) by mouth every 4 (four) hours as needed for mild pain (available over the counter).    Marland Kitchen amLODipine (NORVASC) 10 MG tablet Take 1 tablet (10 mg total) by mouth daily. For blood pressure 90 tablet 3  . aspirin 325 MG tablet Take 325 mg by mouth daily.    . baclofen (LIORESAL) 10 MG tablet Take 1 tablet (10 mg total) by mouth 2 (two) times daily. To help with tone/spasms 60 tablet 1  . gabapentin (NEURONTIN) 100 MG capsule Take 1 capsule (100 mg total) by mouth 3 (three) times daily. 90 capsule 1  . glycerin adult (GLYCERIN ADULT) 2 G SUPP Place 1 suppository rectally once as needed for moderate constipation. 10 suppository 0  . hydrochlorothiazide (HYDRODIURIL) 25 MG tablet Take 25 mg by mouth daily.    . Insulin Glargine (LANTUS SOLOSTAR) 100 UNIT/ML Solostar Pen Inject 10 Units into the skin 2 (two) times daily. With breakfast and supper 15 pen   . Insulin Pen Needle 31G X 6 MM MISC Use as prescribed by physician 100 each 12  . metoprolol (LOPRESSOR) 100 MG tablet Take 1 tablet (100 mg total) by mouth 3 (three) times daily. 90 tablet 4  . metroNIDAZOLE (FLAGYL) 500 MG tablet Take 1 tablet (500 mg total) by mouth 2 (two) times daily. 14 tablet 0  . mineral oil enema  Place 133 mLs (1 enema total) rectally once as needed for severe constipation. 133 mL 0  . polyethylene glycol powder (GLYCOLAX/MIRALAX) powder Take 17 g by mouth daily. 527 g 2  . simvastatin (ZOCOR) 20 MG tablet Take 20 mg by mouth daily at 6 PM.     No current facility-administered medications on file prior to visit.   Family History  Problem Relation Age of Onset  . Hypertension Mother   . Stroke Father   . Hypertension Father   . Diabetes Father    History   Social History  . Marital  Status: Single    Spouse Name: N/A  . Number of Children: 3  . Years of Education: 12   Occupational History  . disabled    Social History Main Topics  . Smoking status: Never Smoker   . Smokeless tobacco: Never Used  . Alcohol Use: No  . Drug Use: No  . Sexual Activity: Not on file   Other Topics Concern  . Not on file   Social History Narrative   Patient is single with 3 children.   Patient is right handed.   Patient has hs education.   Patient does not drink caffeine.    Review of Systems: See HPI     Objective:   Filed Vitals:   06/23/14 1658  BP: 144/87  Pulse: 73  Temp: 98 F (36.7 C)  Resp: 15    Physical Exam  Constitutional: She is oriented to person, place, and time.  Cardiovascular: Normal rate, regular rhythm and normal heart sounds.   Pulmonary/Chest: Effort normal and breath sounds normal.  Abdominal: Soft. Bowel sounds are normal.  Neurological: She is alert and oriented to person, place, and time.  Skin: Skin is warm and dry.     Lab Results  Component Value Date   WBC 8.6 05/31/2014   HGB 13.9 05/31/2014   HCT 41.0 05/31/2014   MCV 87.5 05/31/2014   PLT 205 05/31/2014   Lab Results  Component Value Date   CREATININE 0.80 05/31/2014   BUN 14 05/31/2014   NA 142 05/31/2014   K 3.3* 05/31/2014   CL 101 05/31/2014   CO2 31 05/31/2014    Lab Results  Component Value Date   HGBA1C 5.80 04/02/2014   Lipid Panel     Component Value Date/Time   CHOL 105 12/06/2013 0345   TRIG 66 12/06/2013 0345   HDL 42 12/06/2013 0345   CHOLHDL 2.5 12/06/2013 0345   VLDL 13 12/06/2013 0345   LDLCALC 50 12/06/2013 0345       Assessment and plan:   Charniece was seen today for follow-up.  Diagnoses and all orders for this visit:  Vasovagal syncope Explained how straining to have a bowel movement may exacerbate a syncopal episode  Fecal impaction Daughter will bring patient when patient has constipation and we will do a manual fecal  removal on patient at that time to prevent her straining and having repeat episodes.  Impaction is result of limited mobility of patient. I do believe it will be beneficial for patient to continue in PT and speech therapy  Return in about 3 months (around 09/22/2014), or FOR FECAL IMPACTION.      Chari Manning, [facility] [phone] 06/23/2014, 5:10 PM

## 2014-06-30 ENCOUNTER — Telehealth: Payer: Self-pay | Admitting: Internal Medicine

## 2014-06-30 NOTE — Telephone Encounter (Signed)
Georgina Snell called to speak to PCP's nurse about the patient, he states that the patient qualifies for Hospice for physical therapy and he would like a verbal order to evaluate and admit to hospice if appropriate, per family request they would like the patient to be on Hospice.

## 2014-07-03 ENCOUNTER — Telehealth: Payer: Self-pay | Admitting: *Deleted

## 2014-07-03 NOTE — Telephone Encounter (Signed)
Pharmacy called to get refill on patient's simvastatin.  [name] was not the original prescriber.  Can we refill?

## 2014-07-03 NOTE — Telephone Encounter (Signed)
Please refill for patient. Thanks

## 2014-07-06 ENCOUNTER — Other Ambulatory Visit: Payer: Self-pay | Admitting: *Deleted

## 2014-07-06 MED ORDER — SIMVASTATIN 20 MG PO TABS: 20.0000 mg | ORAL_TABLET | Freq: Every day | ORAL | Status: DC

## 2014-07-09 ENCOUNTER — Ambulatory Visit (INDEPENDENT_AMBULATORY_CARE_PROVIDER_SITE_OTHER): Payer: Medicaid Other | Admitting: Neurology

## 2014-07-09 ENCOUNTER — Encounter: Payer: Self-pay | Admitting: Neurology

## 2014-07-09 VITALS — BP 122/82 | HR 70 | Wt 111.0 lb

## 2014-07-09 DIAGNOSIS — I10 Essential (primary) hypertension: Secondary | ICD-10-CM | POA: Diagnosis not present

## 2014-07-09 DIAGNOSIS — E1149 Type 2 diabetes mellitus with other diabetic neurological complication: Secondary | ICD-10-CM

## 2014-07-09 DIAGNOSIS — G8110 Spastic hemiplegia affecting unspecified side: Secondary | ICD-10-CM | POA: Diagnosis not present

## 2014-07-09 DIAGNOSIS — I6340 Cerebral infarction due to embolism of unspecified cerebral artery: Secondary | ICD-10-CM | POA: Diagnosis not present

## 2014-07-09 DIAGNOSIS — R404 Transient alteration of awareness: Secondary | ICD-10-CM | POA: Diagnosis not present

## 2014-07-09 DIAGNOSIS — IMO0001 Reserved for inherently not codable concepts without codable children: Secondary | ICD-10-CM | POA: Insufficient documentation

## 2014-07-09 NOTE — Patient Instructions (Signed)
-   continue ASA and zocor for stroke prevention  - follow up with Dr. Read Drivers for rehab - will check EEG to rule out seizure activity - Follow up with your primary care physician for stroke risk factor modification. Recommend maintain blood pressure goal <130/80, diabetes with hemoglobin A1c goal below 6.5% and lipids with LDL cholesterol goal below 70 mg/dL.  - check BP and glucose at home - follow up in 6 months.

## 2014-07-09 NOTE — Progress Notes (Signed)
PATIENT: [name] DOB: 1960-09-23  REASON FOR VISIT: Hospital follow up for stroke HISTORY FROM: daughter and patient  HISTORY OF PRESENT ILLNESS: 54 y.o. Twilight female who comes to the office for first hospital follow up post hospital discharge for stroke. She has a history of HTN, DM, bipolar disorder, who was admitted on 05/27/13 with right facial droop and aphasia. 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 Infarcts felt to be embolic due to unknown source and she was placed on ASA for secondary stroke prevention.  Patient had progressive neurologic worsening on 05/29/13 pm with decrease in LOC and right sided weakness. EEG negative for seizures and CCT without significant changes; felt patient with progression of symptoms due to relative hypotension and recommended allowing significantly high permissive hypertension in this patient. Hgb A1c was 14.1 in hospital, now 5.9.  LDL was 130. Hypercoagulable workup negative. 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 No other intracranial flow-limiting stenosis or embolic disease evident. Areas of low density in the left MCA territory consistent with infarction. 2D Echocardiogram with EF of 55-60% with no source of embolus. TEE was not done due to hypotension in hospital. Patient with resultant right hemiparesis and aphasia.  Has had PEG tube removed by radiology in June. Eating well and drinking well. Remains aphasic. Contemplating Botox injections to the wrist and finger flexors, hamstrings, and gastrocnemius by Dr.  Letta Pate. Using Baclofen for spasticity and gabapentin for pain. 73 year old daughter is providing all care.   01/05/14 follow up - she still takes full dose ASA and zocor for stroke prevention. Follows with PCP for BP and DM. RLE muscle strength improved but RUE still flaccid. Remains aphasic with only "Thank you" and "yes/no". She had inpt rehab followed by home PT/OT but then discharged from home PT/OT but no outpt PT/OT initiated yet. Daughter is going to check the status of outpt PT/OT.  04/02/14 follow up - patient has been doing the same. Still has significant aphasia and RUE weakness, but she was able to walk with semi-walker now. She follows with Dr. Read Drivers and will start PT and botox injection again. Her loop recorder never put in, will have to refer her again. She went to ER on 03/17/14 for episode of holding head, do not respond on commands, keep sticking in and out of her tougue for about 5 min, no shaking jerking or LOC. Daughter concerned and pt sent to ER, repeat CT no acute changes and pt back to baseline.   Interval history During the interval time, patient has been doing the same. She was referred to Dr. Rayann Heman for loop recorder placement, however she refused again. Blood pressure 122/82 in clinic today, she still has significant aphasia and right sided weakness. As per daughter, patient recently started to have staring spells with drooling at the right mouth, no shaking jerking, tongue biting, b/b incontinence, lasting several minutes.  REVIEW OF SYSTEMS: Full 14 system review of systems performed and notable only for: eye itching, ringing in ears, runny nose, light sensitivity, eye pain, leg swelling, cold intolerance, excessive thirst, constipation, frequent waking, joint  pain, aching muscles, walking difficulty, speech difficulty, weakness, agitation and confusion.  ALLERGIES: No Known Allergies  HOME MEDICATIONS: Outpatient Prescriptions Prior to Visit  Medication Sig  Dispense Refill  . acetaminophen (TYLENOL) 325 MG tablet Take 1-2 tablets (325-650 mg total) by mouth every 4 (four) hours as needed for mild pain (available over the counter).    Marland Kitchen amLODipine (NORVASC) 10 MG tablet Take 1 tablet (10 mg total) by mouth daily. For blood pressure 90 tablet 3  . aspirin 325 MG tablet Take 325 mg by mouth daily.    . baclofen (LIORESAL) 10 MG tablet Take 1 tablet (10 mg total) by mouth 2 (two) times daily. To help with tone/spasms 60 tablet 1  . gabapentin (NEURONTIN) 100 MG capsule Take 1 capsule (100 mg total) by mouth 3 (three) times daily. 90 capsule 1  . glycerin adult (GLYCERIN ADULT) 2 G SUPP Place 1 suppository rectally once as needed for moderate constipation. 10 suppository 0  . hydrochlorothiazide (HYDRODIURIL) 25 MG tablet Take 25 mg by mouth daily.    . Insulin Glargine (LANTUS SOLOSTAR) 100 UNIT/ML Solostar Pen Inject 10 Units into the skin 2 (two) times daily. With breakfast and supper 15 pen   . Insulin Pen Needle 31G X 6 MM MISC Use as prescribed by physician 100 each 12  . metoprolol (LOPRESSOR) 100 MG tablet Take 1 tablet (100 mg total) by mouth 3 (three) times daily. 90 tablet 4  . mineral oil enema Place 133 mLs (1 enema total) rectally once as needed for severe constipation. 133 mL 0  . polyethylene glycol powder (GLYCOLAX/MIRALAX) powder Take 17 g by mouth daily. 527 g 2  . simvastatin (ZOCOR) 20 MG tablet Take 1 tablet (20 mg total) by mouth daily at 6 PM. 30 tablet 3  . metroNIDAZOLE (FLAGYL) 500 MG tablet Take 1 tablet (500 mg total) by mouth 2 (two) times daily. 14 tablet 0   No facility-administered medications prior to visit.    PHYSICAL EXAM Filed Vitals:   07/09/14 1438  BP: 122/82  Pulse: 70  Weight: 111 lb (50.349 kg)   Body mass index is 18.47 kg/(m^2). No exam data present  Generalized: Well developed, in no acute distress  Head: normocephalic and atraumatic. Oropharynx benign  Neck: Supple, no carotid bruits  Cardiac:  Regular rate rhythm, no murmur   Neurological examination  Mentation: Orientation unable to assess due to aphasia.  Severe expressive aphasia with only "Thank you" and no sentences. Able to comprehend 1-2 simple central commands, but not peripheral commands. Unable to name or repeat.  Cranial nerve II-XII: Fundoscopic exam not able to see through. Pupils were equal round reactive to light, extraocular movements were full.  Facial droop on the right, normal on the left. Hearing was intact to finger rubbing bilaterally. Tongue in middle. Head turning and shoulder shrug are asymmetric, impaired on right. Motor: Right upper extremity distal strength 0/5, proximal 2/5, right lower extremity strength is 4/5 proxima and 3/5 distal. Left upper and lower extremity strength 5/5. Increased tone on the right Sensory: Sensory testing is intact to soft touch LUE and LLE. Evidence of extinction is noted on the right. Coordination: Cerebellar testing not able to do on the right. Gait and station: Gait is not tested, in wheelchair Reflexes: Deep tendon reflexes are symmetric and decreased bilaterally.   I have personally reviewed the radiological images below and agree with the radiology interpretations.  EEG 05/2013 - This is an abnormal EEG secondary to slowing over  the left hmeisphere with frequent left hemispheric sharp acitivty as well.. This finding is suggestive of a focal disturbance that is etiologically nonspecific, but may include a mass lesion or post-ictal focal slowing among other possibilities.   CT of the brain 05/27/2013 No acute intracranial pathology.   CT brain 03/17/14 - No acute abnormality. 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  CTA head 05/2013- Severe stenosis of the left middle cerebral artery at the M1 segment with markedly diminished visualization of the more distal left MCA branches. No other intracranial flow-limiting stenosis or embolic disease evident. Areas of low  density in the left MCA territory consistent with infarction. No hemorrhagic transformation. Question new low density in the pons, not definite.  MRI of the brain 05/27/2013 Multiple foci of acute ischemia within the left middle cerebral artery territory. In addition, however punctate focus of acute ischemia within the left occipital lobe and right thalamus (both of which would be posterior circulation). All areas of ischemia have similar acuity. No hemorrhagic conversion. Symmetric abnormal bright signal within the lenticulostriate nuclei (basal ganglia) in addition to abnormal temporoccipital periventricular T2 hyperintense signal. These findings may reflect sequelae of acute severe hypertension (PRES) but, it can also be seen with osmotic demyelination, autoimmune disorders such as scleroderma (which can have associated vasculopathy), less likely infectious encephalitis. Moderate to severe white matter changes, in a distribution which can be seen with demyelination, with superimposed possible chronic small vessel ischemic disease though, are nonspecific.   MRI brain 12/05/13 - No evidence of acute ischemia, specifically no acute LEFT sided stroke. Remote large LEFT middle cerebral artery territory hemorrhagic infarct. Similar abnormal signal within the basal ganglia and pons could reflect metabolic disorder, sequelae of possible PRES, less likely pontine/extra pontine myelinolysis, findings are similar. Remote bilateral thalamus lacunar infarcts. Moderate white matter changes suggest chronic small vessel ischemic disease.  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 Mild luminal irregularity of the intracranial vessels which can be seen with intracranial atherosclerosis though, may also be seen with vasculopathy.   2D Echocardiogram EF 55-60% with no source of embolus.   Carotid Doppler No evidence of hemodynamically significant internal carotid  artery stenosis. Vertebral artery flow is antegrade.   TCD emboli detection -  negative  Component     Latest Ref Rng 05/28/2013 05/30/2013 08/22/2013 12/05/2013  PTT Lupus Anticoagulant     28.0 - 43.0 secs 37.9     PTTLA Confirmation     <8.0 secs NOT APPL     PTTLA 4:1 Mix     28.0 - 43.0 secs NOT APPL     DRVVT     <42.9 secs 35.1     Drvvt confirmation     <1.11 Ratio NOT APPL     dRVVT Incubated 1:1 Mix     <42.9 secs NOT APPL     Lupus Anticoagulant     NOT DETECTED NOT DETECTED     Cholesterol     0 - 200 mg/dL 201 (H)     Triglycerides     <150 mg/dL 145     HDL     >39 mg/dL 42     Total CHOL/HDL Ratio      4.8     VLDL     0 - 40 mg/dL 29     LDL (calc)     0 - 99 mg/dL 130 (H)     Anticardiolipin IgG     <23 GPL U/mL 5 (L)  Anticardiolipin IgM     <11 MPL U/mL 2 (L)     Anticardiolipin IgA     <22 APL U/mL 10 (L)     Hgb A1c MFr Bld      14.6 (H) 14.1 (H) 6.8   Mean Plasma Glucose     <117 mg/dL 372 (H) 358 (H)    C3 Complement     90 - 180 mg/dL 115     Complement C4, Body Fluid     10 - 40 mg/dL 31     Compl, Total (CH50)     31 - 60 U/mL >60 (H)     ANA Ser Ql     NEGATIVE NEGATIVE     RPR     NON REACTIVE NON REACTIVE     Sed Rate     0 - 22 mm/hr 25 (H)     AntiThromb III Func     75 - 120 % 125 (H)     Protein C Activity     75 - 133 % 200 (H)     Protein C, Total     72 - 160 % 165 (H)     Protein S Activity     69 - 129 % 120     Protein S Ag, Total     60 - 150 % 133     Homocysteine     4.0 - 15.4 umol/L 8.0     Recommendations-PTGENE:      (NOTE)     HIV     NON REACTIVE NON REACTIVE     TSH     0.350 - 4.500 uIU/mL    1.690    ASSESSMENT: 54 y.o. year old AA female  has a past medical history of Hypertension; Depression, Diabetes mellitus; Bipolar affective disorder, Schizophrenia followed up in clinic for Left MCA distribution infarct from 05/27/13. Imaging confirmed multiple bilateral, most in the R MCA but also R  occipital and L thalamus infarcts in setting of malignant hypertension with BP 220/125 and uncontrolled DM A1C 14.6. MRA showed left MCA occlusion. Infarcts felt to be embolic secondary to unknown source, although not able to rule out large vessel asthero due to severe HTN and uncontrolled DM.  Stroke workup negative including carotid Doppler, TCD MES, hypercoagulable workup. Patient refused loop recorder. Residual right hemiparesis and severe aphasia. Needs assistance with all ADL's. Currently BP and glucose in good control. Episode in 03/17/14 has low suspicious for seizure. However, patient daughter stated that patient recently had staring spells with drooling on the right side. She had a EEG in March 2015 which was negative for seizure.  PLAN: - continue ASA and zocor for stroke prevention  - follow up with Dr. Read Drivers for rehab - will check EEG to rule out seizure activity - Follow up with primary care physician for stroke risk factor modification. Recommend maintain blood pressure goal <130/80, diabetes with hemoglobin A1c goal below 6.5% and lipids with LDL cholesterol goal below 70 mg/dL.  - check BP and glucose at home - RTC in 6 months.   Rosalin Hawking, MD PhD [facility] [address], [place], [place] [address] [phone]  Orders Placed This Encounter  Procedures  . EEG adult    Standing Status: Future     Number of Occurrences:      Standing Expiration Date: 07/09/2015     Patient Instructions  - continue ASA and zocor for stroke prevention  - follow up  with Dr. Read Drivers for rehab - will check EEG to rule out seizure activity - Follow up with your primary care physician for stroke risk factor modification. Recommend maintain blood pressure goal <130/80, diabetes with hemoglobin A1c goal below 6.5% and lipids with LDL cholesterol goal below 70 mg/dL.  - check BP and glucose at home - follow up in 6 months.

## 2014-07-13 ENCOUNTER — Ambulatory Visit (INDEPENDENT_AMBULATORY_CARE_PROVIDER_SITE_OTHER): Payer: Medicaid Other | Admitting: Neurology

## 2014-07-13 DIAGNOSIS — R404 Transient alteration of awareness: Secondary | ICD-10-CM

## 2014-07-13 DIAGNOSIS — IMO0001 Reserved for inherently not codable concepts without codable children: Secondary | ICD-10-CM

## 2014-07-15 ENCOUNTER — Emergency Department (HOSPITAL_COMMUNITY)
Admission: EM | Admit: 2014-07-15 | Discharge: 2014-07-15 | Disposition: A | Discharge status: Home / Self Care | Payer: Medicaid Other | Attending: Emergency Medicine | Admitting: Emergency Medicine

## 2014-07-15 ENCOUNTER — Emergency Department (HOSPITAL_COMMUNITY): Payer: Medicaid Other

## 2014-07-15 ENCOUNTER — Encounter (HOSPITAL_COMMUNITY): Payer: Self-pay | Admitting: General Practice

## 2014-07-15 DIAGNOSIS — Z8673 Personal history of transient ischemic attack (TIA), and cerebral infarction without residual deficits: Secondary | ICD-10-CM | POA: Diagnosis not present

## 2014-07-15 DIAGNOSIS — R4182 Altered mental status, unspecified: Secondary | ICD-10-CM | POA: Diagnosis present

## 2014-07-15 DIAGNOSIS — R404 Transient alteration of awareness: Secondary | ICD-10-CM | POA: Diagnosis not present

## 2014-07-15 DIAGNOSIS — Z7982 Long term (current) use of aspirin: Secondary | ICD-10-CM | POA: Insufficient documentation

## 2014-07-15 DIAGNOSIS — IMO0001 Reserved for inherently not codable concepts without codable children: Secondary | ICD-10-CM

## 2014-07-15 DIAGNOSIS — Z794 Long term (current) use of insulin: Secondary | ICD-10-CM | POA: Diagnosis not present

## 2014-07-15 DIAGNOSIS — R29810 Facial weakness: Secondary | ICD-10-CM | POA: Diagnosis not present

## 2014-07-15 DIAGNOSIS — Z79899 Other long term (current) drug therapy: Secondary | ICD-10-CM | POA: Diagnosis not present

## 2014-07-15 LAB — I-STAT CHEM 8, ED
BUN: 15 mg/dL (ref 6–23)
CALCIUM ION: 1.26 mmol/L — AB (ref 1.12–1.23)
Chloride: 100 mmol/L (ref 96–112)
Creatinine, Ser: 0.8 mg/dL (ref 0.50–1.10)
GLUCOSE: 120 mg/dL — AB (ref 70–99)
HEMATOCRIT: 43 % (ref 36.0–46.0)
HEMOGLOBIN: 14.6 g/dL (ref 12.0–15.0)
Potassium: 3.7 mmol/L (ref 3.5–5.1)
Sodium: 143 mmol/L (ref 135–145)
TCO2: 28 mmol/L (ref 0–100)

## 2014-07-15 LAB — URINALYSIS, ROUTINE W REFLEX MICROSCOPIC
Bilirubin Urine: NEGATIVE
Glucose, UA: NEGATIVE mg/dL
HGB URINE DIPSTICK: NEGATIVE
Ketones, ur: NEGATIVE mg/dL
Leukocytes, UA: NEGATIVE
Nitrite: NEGATIVE
Protein, ur: NEGATIVE mg/dL
SPECIFIC GRAVITY, URINE: 1.007 (ref 1.005–1.030)
UROBILINOGEN UA: 1 mg/dL (ref 0.0–1.0)
pH: 7 (ref 5.0–8.0)

## 2014-07-15 LAB — COMPREHENSIVE METABOLIC PANEL
ALBUMIN: 3.9 g/dL (ref 3.5–5.2)
ALT: 22 U/L (ref 0–35)
AST: 23 U/L (ref 0–37)
Alkaline Phosphatase: 54 U/L (ref 39–117)
Anion gap: 6 (ref 5–15)
BUN: 12 mg/dL (ref 6–23)
CO2: 32 mmol/L (ref 19–32)
CREATININE: 0.8 mg/dL (ref 0.50–1.10)
Calcium: 9.7 mg/dL (ref 8.4–10.5)
Chloride: 103 mmol/L (ref 96–112)
GFR calc non Af Amer: 84 mL/min — ABNORMAL LOW (ref >90)
GLUCOSE: 122 mg/dL — AB (ref 70–99)
Potassium: 3.6 mmol/L (ref 3.5–5.1)
Sodium: 141 mmol/L (ref 135–145)
TOTAL PROTEIN: 7.2 g/dL (ref 6.0–8.3)
Total Bilirubin: 0.7 mg/dL (ref 0.3–1.2)

## 2014-07-15 LAB — CBC
HCT: 37.7 % (ref 36.0–46.0)
Hemoglobin: 12.7 g/dL (ref 12.0–15.0)
MCH: 30 pg (ref 26.0–34.0)
MCHC: 33.7 g/dL (ref 30.0–36.0)
MCV: 89.1 fL (ref 78.0–100.0)
PLATELETS: 237 10*3/uL (ref 150–400)
RBC: 4.23 MIL/uL (ref 3.87–5.11)
RDW: 11.9 % (ref 11.5–15.5)
WBC: 4.9 10*3/uL (ref 4.0–10.5)

## 2014-07-15 LAB — PROTIME-INR
INR: 1.01 (ref 0.00–1.49)
PROTHROMBIN TIME: 13.4 s (ref 11.6–15.2)

## 2014-07-15 LAB — RAPID URINE DRUG SCREEN, HOSP PERFORMED
AMPHETAMINES: NOT DETECTED
BENZODIAZEPINES: NOT DETECTED
Barbiturates: NOT DETECTED
COCAINE: NOT DETECTED
OPIATES: NOT DETECTED
TETRAHYDROCANNABINOL: NOT DETECTED

## 2014-07-15 LAB — I-STAT TROPONIN, ED: Troponin i, poc: 0 ng/mL (ref 0.00–0.08)

## 2014-07-15 LAB — DIFFERENTIAL
BASOS ABS: 0 10*3/uL (ref 0.0–0.1)
BASOS PCT: 0 % (ref 0–1)
EOS ABS: 0.1 10*3/uL (ref 0.0–0.7)
Eosinophils Relative: 1 % (ref 0–5)
Lymphocytes Relative: 58 % — ABNORMAL HIGH (ref 12–46)
Lymphs Abs: 2.8 10*3/uL (ref 0.7–4.0)
Monocytes Absolute: 0.4 10*3/uL (ref 0.1–1.0)
Monocytes Relative: 7 % (ref 3–12)
NEUTROS PCT: 34 % — AB (ref 43–77)
Neutro Abs: 1.7 10*3/uL (ref 1.7–7.7)

## 2014-07-15 LAB — APTT: aPTT: 29 seconds (ref 24–37)

## 2014-07-15 NOTE — ED Provider Notes (Signed)
  Physical Exam  BP 154/84 mmHg  Pulse 77  Temp(Src) 98.4 F (36.9 C) (Oral)  Resp 16  SpO2 97%  Physical Exam  ED Course  Procedures  MDM Patient's lab work and imaging is reassuring. Feels better. Will discharge home.      Davonna Belling, MD 07/15/14 2004

## 2014-07-15 NOTE — ED Notes (Signed)
Pt in CT.

## 2014-07-15 NOTE — Discharge Instructions (Signed)
Follow-up with neurology and her primary care doctor for further evaluation.

## 2014-07-15 NOTE — ED Notes (Addendum)
Pt brought in via [facility] with complaints of AMS. Pt has a history of a CVA with right sided weakness and right facial droop. Pt baseline is altered, but pt is usually able to answer yes no questions. Per patient daughter, pts facial droop this morning was more pronounced and pt is currently unable to answer questions. Pt able to follow commands. Pt has some expressive aphasia. Pt was last seen normal at 5pm yesterday.

## 2014-07-15 NOTE — ED Provider Notes (Signed)
CSN: [id number]     Arrival date & time 07/15/14  1454 History   First MD Initiated Contact with Patient 07/15/14 1501     Chief Complaint  Patient presents with  . Altered Mental Status     (Consider location/radiation/quality/duration/timing/severity/associated sxs/prior Treatment) HPI Patient is a 54 year old female with a history of CVA with residual right-sided weakness and a aphasia who presents today. Her daughter states that she feels she has been somewhat more confused over the past 2 days. She states that she has had some episodes where she appears to not be as awake as usual. She has been seen by her neurologist and has had an EEG done for this. The daughter states that she will not get the results of this for a while. I reviewed the EEG results and it reveals no evidence of epileptiform abnormality please see the patient result below. Normally the patient will answer questions yes and no appropriately. She has had episodes when she has not answered those questions appropriately according to her daughter. She states that yesterday while the brother was taking care of the patient. The patient came out of the Bactrim that her depends on which was atypical for her. She walks with assistance and a walker.  Summary This is a mildly abnormal EEG due to the presence of left hemispheric slowing and focal left frontotemporal irritability which may be compatible with patient's history of large left hemispheric infarct. No definite epileptiform activity is noted. Overall no significant change compared with EEG dated 05/31/14.   Past Surgical History  Procedure Laterality Date  . Cesarean section  ~ 1987  . Tubal ligation    . Tee without cardioversion N/A 12/08/2013    Procedure: TRANSESOPHAGEAL ECHOCARDIOGRAM (TEE);  Surgeon: Candee Furbish, MD;  Location: [facility] ENDOSCOPY;  Service: Cardiovascular;  Laterality: N/A;   Family History  Problem Relation Age of Onset  . Hypertension Mother   .  Stroke Father   . Hypertension Father   . Diabetes Father    History  Substance Use Topics  . Smoking status: Never Smoker   . Smokeless tobacco: Never Used  . Alcohol Use: No   OB History    No data available     Review of Systems  All other systems reviewed and are negative.     Allergies  Review of patient's allergies indicates no known allergies.  Home Medications   Prior to Admission medications   Medication Sig Start Date End Date Taking? Authorizing Provider  acetaminophen (TYLENOL) 325 MG tablet Take 1-2 tablets (325-650 mg total) by mouth every 4 (four) hours as needed for mild pain (available over the counter). 07/08/13   Ivan Anchors Love, PA-C  amLODipine (NORVASC) 10 MG tablet Take 1 tablet (10 mg total) by mouth daily. For blood pressure 03/11/14   Lance Bosch, NP  aspirin 325 MG tablet Take 325 mg by mouth daily.    Historical Provider, MD  baclofen (LIORESAL) 10 MG tablet Take 1 tablet (10 mg total) by mouth 2 (two) times daily. To help with tone/spasms 11/07/13   Lance Bosch, NP  gabapentin (NEURONTIN) 100 MG capsule Take 1 capsule (100 mg total) by mouth 3 (three) times daily. 11/07/13   Lance Bosch, NP  glycerin adult (GLYCERIN ADULT) 2 G SUPP Place 1 suppository rectally once as needed for moderate constipation. 05/31/14   Linton Flemings, MD  hydrochlorothiazide (HYDRODIURIL) 25 MG tablet Take 25 mg by mouth daily.    Historical Provider, MD  Insulin Glargine (LANTUS SOLOSTAR) 100 UNIT/ML Solostar Pen Inject 10 Units into the skin 2 (two) times daily. With breakfast and supper 12/08/13   Nita Sells, MD  Insulin Pen Needle 31G X 6 MM MISC Use as prescribed by physician 03/03/14   Lance Bosch, NP  metoprolol (LOPRESSOR) 100 MG tablet Take 1 tablet (100 mg total) by mouth 3 (three) times daily. 05/29/14   Lance Bosch, NP  mineral oil enema Place 133 mLs (1 enema total) rectally once as needed for severe constipation. 05/31/14   Linton Flemings, MD   polyethylene glycol powder (GLYCOLAX/MIRALAX) powder Take 17 g by mouth daily. 05/31/14   Linton Flemings, MD  simvastatin (ZOCOR) 20 MG tablet Take 1 tablet (20 mg total) by mouth daily at 6 PM. 07/06/14   Lance Bosch, NP   BP 153/78 mmHg  Pulse 74  Temp(Src) 98.6 F (37 C) (Oral)  Resp 22  SpO2 99% Physical Exam  Constitutional: She appears well-developed and well-nourished.  HENT:  Head: Normocephalic and atraumatic.  Right Ear: External ear normal.  Left Ear: External ear normal.  Mouth/Throat: Oropharynx is clear and moist.  Right facial droop  Eyes: Pupils are equal, round, and reactive to light.  Neck: Normal range of motion.  Cardiovascular: Normal rate.   Pulmonary/Chest: Effort normal.  Abdominal: Soft.  Musculoskeletal: Normal range of motion.  Neurological:  The patient is alert. Answers thank you to many questions. She does have some correct yes and no answers. She has a right facial droop. Her right upper extremity is contracted. Her right lower extremity is difficult to flex the knee. She is hyperreflexive throughout. Daughter is at bedside and this appears to be near baseline.  Nursing note and vitals reviewed.   ED Course  Procedures (including critical care time) Labs Review Labs Reviewed  ETHANOL  PROTIME-INR  APTT  CBC  DIFFERENTIAL  COMPREHENSIVE METABOLIC PANEL  URINE RAPID DRUG SCREEN (HOSP PERFORMED)  URINALYSIS, ROUTINE W REFLEX MICROSCOPIC  I-STAT CHEM 8, ED  I-STAT TROPOININ, ED  I-STAT TROPOININ, ED    Imaging Review No results found.   EKG Interpretation None      MDM   Final diagnoses:  None      54 year old female history of stroke whose daughter is concerned that she has been taping somewhat differently over the past several days. She was seen by a neurologist and had an EEG performed which showed no elliptical form abnormalities. Patient is having a head CT here and lab workup. I doubt from exam that there is any new stroke  or bleeding. However, she could have an electrical abnormality or urinary tract infection. Lab workup is initiated. I discussed the patient's care with Dr. Alvino Chapel and he has assumed her care.4:57 PM   Pattricia Boss, MD 07/15/14 [phone]

## 2014-07-16 ENCOUNTER — Telehealth: Payer: Self-pay | Admitting: Neurology

## 2014-07-16 ENCOUNTER — Ambulatory Visit: Payer: Medicaid Other | Admitting: Physical Medicine & Rehabilitation

## 2014-07-16 DIAGNOSIS — I639 Cerebral infarction, unspecified: Secondary | ICD-10-CM

## 2014-07-16 DIAGNOSIS — IMO0001 Reserved for inherently not codable concepts without codable children: Secondary | ICD-10-CM

## 2014-07-16 NOTE — Telephone Encounter (Signed)
Patient's daughter is calling to advise that the patient was seen in the ER on 07-15-14. Patient was having seizure symptoms and the tests preformed at the hospital were inconclusive and was advised to report this to our office.

## 2014-07-16 NOTE — Telephone Encounter (Signed)
Discussed with daughter over the phone, daughter concerns some mental changes over the last several weeks. Not sure if there's any seizure activity, however EEG was negative for seizure. Offered seizure medication trial, however daughter want to make sure the seizure diagnosis before putting on seizure medication. Will need to order home EEG monitoring.  Hi, Mary, I know we have a company who do the long term EEG monitoring at home. Do you know how to order it? I oredered once before but I forgot how to do it. Thanks.  Rosalin Hawking, MD PhD Stroke Neurology 07/16/2014 5:01 PM

## 2014-07-17 NOTE — Addendum Note (Signed)
Addended by: Minna Antis on: 07/17/2014 09:29 AM   Modules accepted: Orders

## 2014-07-17 NOTE — Telephone Encounter (Signed)
Order entered for EEG through [facility]. Required paperwork on your desk.

## 2014-07-17 NOTE — Telephone Encounter (Signed)
Thanks, Stanton Kidney.  Rosalin Hawking, MD PhD Stroke Neurology 07/17/2014 3:19 PM

## 2014-07-20 NOTE — Telephone Encounter (Signed)
Completed [facility] "Certificate of Medical Necessity" successfully faxed for EEG order.

## 2014-07-21 ENCOUNTER — Telehealth: Payer: Self-pay | Admitting: Internal Medicine

## 2014-07-21 NOTE — Telephone Encounter (Signed)
[name] a nurse calling from hospice is needing a verbal order from either dr. Feliciana Rossetti or nurse in order to evaluate and provide hospice care if appropriate. Please follow up with [facility].

## 2014-07-22 ENCOUNTER — Encounter: Payer: Self-pay | Admitting: Internal Medicine

## 2014-07-22 ENCOUNTER — Ambulatory Visit: Payer: Medicaid Other | Attending: Internal Medicine | Admitting: Internal Medicine

## 2014-07-22 VITALS — BP 108/73 | HR 66 | Temp 98.1°F | Resp 16

## 2014-07-22 DIAGNOSIS — R1310 Dysphagia, unspecified: Secondary | ICD-10-CM | POA: Diagnosis not present

## 2014-07-22 DIAGNOSIS — Z794 Long term (current) use of insulin: Secondary | ICD-10-CM | POA: Diagnosis not present

## 2014-07-22 DIAGNOSIS — N3000 Acute cystitis without hematuria: Secondary | ICD-10-CM | POA: Diagnosis not present

## 2014-07-22 DIAGNOSIS — E119 Type 2 diabetes mellitus without complications: Secondary | ICD-10-CM | POA: Insufficient documentation

## 2014-07-22 DIAGNOSIS — R350 Frequency of micturition: Secondary | ICD-10-CM

## 2014-07-22 DIAGNOSIS — Z515 Encounter for palliative care: Secondary | ICD-10-CM | POA: Diagnosis not present

## 2014-07-22 LAB — GLUCOSE, POCT (MANUAL RESULT ENTRY): POC Glucose: 70 mg/dl (ref 70–99)

## 2014-07-22 LAB — POCT URINALYSIS DIPSTICK
Blood, UA: NEGATIVE
Glucose, UA: NEGATIVE
Ketones, UA: 15
NITRITE UA: NEGATIVE
Protein, UA: NEGATIVE
Spec Grav, UA: 1.025
Urobilinogen, UA: 1
pH, UA: 5.5

## 2014-07-22 LAB — POCT GLYCOSYLATED HEMOGLOBIN (HGB A1C): Hemoglobin A1C: 5.9

## 2014-07-22 MED ORDER — CIPROFLOXACIN HCL 500 MG PO TABS: 500.0000 mg | ORAL_TABLET | Freq: Two times a day (BID) | ORAL | Status: DC

## 2014-07-22 NOTE — Progress Notes (Signed)
Pt is here today following up on her diabetes and HTN. Pt states that she is having a hard time swallowing for about 2 days.

## 2014-07-22 NOTE — Telephone Encounter (Signed)
VO given to Evaluate, Respit Care  and provided hospice care 2-3 time per week Per Chari Manning, FNP

## 2014-07-22 NOTE — Progress Notes (Signed)
Patient ID: [name], female   DOB: 08-Mar-1961, 54 y.o.   MRN: [id number]  CC: dysphagia   HPI: [name] is a 54 y.o. female here today for a follow up visit.  Patient has past medical history of bipolar disorder, schizophrenia, HTN, T2DM, and past CVA.  Patient presents with daughter who explains that for the past 2 days patient has been having difficulty swallowing. She has reported coughing and slight gagging during and after meals. The coughing often persist into the later parts of the night after patient has went to bed.  Patient still suffers from aphasia and was on a previous dysphagia diet following her stroke last year.   Patient's daughter states that she has been evaluated for Hospice and was approved. Patient will receive respite care and visits from the nurse.  Patient has No headache, No chest pain, No abdominal pain - No Nausea, No new weakness tingling or numbness, No Cough - SOB.  No Known Allergies Past Medical History  Diagnosis Date  . Hypertension   . Depression   . Diabetes mellitus     ?type (05/29/2013)  . Bipolar affective disorder   . Schizophrenia     [name] 11/28/2007 (05/29/2013)  . Stroke 05/27/2013   Current Outpatient Prescriptions on File Prior to Visit  Medication Sig Dispense Refill  . acetaminophen (TYLENOL) 325 MG tablet Take 1-2 tablets (325-650 mg total) by mouth every 4 (four) hours as needed for mild pain (available over the counter).    Marland Kitchen amLODipine (NORVASC) 10 MG tablet Take 1 tablet (10 mg total) by mouth daily. For blood pressure 90 tablet 3  . aspirin 325 MG tablet Take 325 mg by mouth daily.    . baclofen (LIORESAL) 10 MG tablet Take 1 tablet (10 mg total) by mouth 2 (two) times daily. To help with tone/spasms 60 tablet 1  . gabapentin (NEURONTIN) 100 MG capsule Take 1 capsule (100 mg total) by mouth 3 (three) times daily. 90 capsule 1  . glycerin adult (GLYCERIN ADULT) 2 G SUPP Place 1 suppository rectally once as needed for  moderate constipation. 10 suppository 0  . hydrochlorothiazide (HYDRODIURIL) 25 MG tablet Take 25 mg by mouth daily.    . Insulin Glargine (LANTUS SOLOSTAR) 100 UNIT/ML Solostar Pen Inject 10 Units into the skin 2 (two) times daily. With breakfast and supper 15 pen   . Insulin Pen Needle 31G X 6 MM MISC Use as prescribed by physician 100 each 12  . metoprolol (LOPRESSOR) 100 MG tablet Take 1 tablet (100 mg total) by mouth 3 (three) times daily. 90 tablet 4  . polyethylene glycol powder (GLYCOLAX/MIRALAX) powder Take 17 g by mouth daily. 527 g 2  . simvastatin (ZOCOR) 20 MG tablet Take 1 tablet (20 mg total) by mouth daily at 6 PM. 30 tablet 3  . mineral oil enema Place 133 mLs (1 enema total) rectally once as needed for severe constipation. (Patient not taking: Reported on 07/22/2014) 133 mL 0   No current facility-administered medications on file prior to visit.   Family History  Problem Relation Age of Onset  . Hypertension Mother   . Stroke Father   . Hypertension Father   . Diabetes Father    History   Social History  . Marital Status: Single    Spouse Name: N/A  . Number of Children: 3  . Years of Education: 12   Occupational History  . disabled    Social History Main Topics  . Smoking  status: Never Smoker   . Smokeless tobacco: Never Used  . Alcohol Use: No  . Drug Use: No  . Sexual Activity: Not on file   Other Topics Concern  . Not on file   Social History Narrative   Patient is single with 3 children.   Patient is right handed.   Patient has hs education.   Patient does not drink caffeine.    Review of Systems: Constitutional: Negative for fever, chills, diaphoresis, activity change, appetite change and fatigue. HENT: Negative for ear pain, nosebleeds, congestion, facial swelling, rhinorrhea, neck pain, neck stiffness and ear discharge.  Eyes: Negative for pain, discharge, redness, itching and visual disturbance. Respiratory: Negative for cough, choking, chest  tightness, shortness of breath, wheezing and stridor.  Cardiovascular: Negative for chest pain, palpitations and leg swelling. Gastrointestinal: Negative for abdominal distention. Genitourinary: Negative for dysuria, urgency, frequency, hematuria, flank pain, decreased urine volume, difficulty urinating and dyspareunia.  Musculoskeletal: Negative for back pain, joint swelling, arthralgias and gait problem. Neurological: Negative for dizziness, tremors, seizures, syncope, facial asymmetry, speech difficulty, weakness, light-headedness, numbness and headaches.  Hematological: Negative for adenopathy. Does not bruise/bleed easily. Psychiatric/Behavioral: Negative for hallucinations, behavioral problems, confusion, dysphoric mood, decreased concentration and agitation.    Objective:   Filed Vitals:   07/22/14 1605  BP: 108/73  Pulse: 66  Temp: 98.1 F (36.7 C)  Resp: 16    Physical Exam: Constitutional: Patient appears well-developed and well-nourished. No distress. HENT: Normocephalic, atraumatic, External right and left ear normal. Oropharynx is clear and moist.  Eyes: Conjunctivae and EOM are normal. PERRLA, no scleral icterus. Neck: Normal ROM. Neck supple. No JVD. No tracheal deviation. No thyromegaly. CVS: RRR, S1/S2 +, no murmurs, no gallops, no carotid bruit.  Pulmonary: Effort and breath sounds normal, no stridor, rhonchi, wheezes, rales.  Abdominal: Soft. BS +,  no distension, tenderness, rebound or guarding.  Musculoskeletal: Normal range of motion. No edema and no tenderness.  Lymphadenopathy: No lymphadenopathy noted, cervical, inguinal or axillary Neuro: Alert. Normal reflexes, muscle tone coordination. No cranial nerve deficit. Skin: Skin is warm and dry. No rash noted. Not diaphoretic. No erythema. No pallor. Psychiatric: Normal mood and affect. Behavior, judgment, thought content normal.  Lab Results  Component Value Date   WBC 4.9 07/15/2014   HGB 14.6 07/15/2014    HCT 43.0 07/15/2014   MCV 89.1 07/15/2014   PLT 237 07/15/2014   Lab Results  Component Value Date   CREATININE 0.80 07/15/2014   BUN 15 07/15/2014   NA 143 07/15/2014   K 3.7 07/15/2014   CL 100 07/15/2014   CO2 32 07/15/2014    Lab Results  Component Value Date   HGBA1C 5.9 07/22/2014   Lipid Panel     Component Value Date/Time   CHOL 105 12/06/2013 0345   TRIG 66 12/06/2013 0345   HDL 42 12/06/2013 0345   CHOLHDL 2.5 12/06/2013 0345   VLDL 13 12/06/2013 0345   LDLCALC 50 12/06/2013 0345       Assessment and plan:   [name] was seen today for follow-up.  Diagnoses and all orders for this visit:  Dysphagia Orders: -     SLP clinical swallow evaluation; Future -     SLP eval and treat; Future -     DG Chest 2 View; Future I have high suspicion that patient is aspirating. I have asked daughter to feed patient one bite at a time and to make sure she has swallowed before next bite. Small sips  and bites. No straws.  Urine frequency Orders: -     POCT urinalysis dipstick  Acute cystitis without hematuria Orders: -     Urine culture -     ciprofloxacin (CIPRO) 500 MG tablet; Take 1 tablet (500 mg total) by mouth 2 (two) times daily. Have asked daughter to keep patient clean and dry to avoid recurrent infection  Encounter for hospice care Verbal order given to Hospice for weekly visits and respite care. Patient is unable to care for herself and is possibly suffering form depression but I am unable to assess due to limited ability to communicate with patient. Failure to thrive versus depression   Type 2 diabetes mellitus without complication Orders: -     Glucose (CBG) -     HgB A1c  Follow up in 3 months.       Brenda Manning, NP-C [facility] [phone] 07/22/2014, 4:34 PM '

## 2014-07-22 NOTE — Patient Instructions (Signed)
If you have not heard from [place] by 11 please call office back

## 2014-07-23 ENCOUNTER — Telehealth: Payer: Self-pay | Admitting: Internal Medicine

## 2014-07-23 ENCOUNTER — Other Ambulatory Visit (HOSPITAL_COMMUNITY): Payer: Self-pay | Admitting: Internal Medicine

## 2014-07-23 DIAGNOSIS — J690 Pneumonitis due to inhalation of food and vomit: Secondary | ICD-10-CM

## 2014-07-23 NOTE — Telephone Encounter (Signed)
Cory from [facility] called to request the most recent office notes faxed over to the office in order to continue to provide care for the patient. [place]

## 2014-07-24 LAB — URINE CULTURE
Colony Count: NO GROWTH
Organism ID, Bacteria: NO GROWTH

## 2014-07-27 ENCOUNTER — Telehealth: Payer: Self-pay | Admitting: *Deleted

## 2014-07-27 NOTE — Telephone Encounter (Signed)
Pt is aware of her lab results.  

## 2014-07-27 NOTE — Telephone Encounter (Signed)
-----   Message from Lance Bosch, NP sent at 07/26/2014 11:27 PM EDT ----- No growth in urine culture.

## 2014-07-28 ENCOUNTER — Ambulatory Visit (HOSPITAL_COMMUNITY)
Admission: RE | Admit: 2014-07-28 | Discharge: 2014-07-28 | Disposition: A | Discharge status: Home / Self Care | Payer: Medicaid Other | Source: Ambulatory Visit | Attending: Internal Medicine | Admitting: Internal Medicine

## 2014-07-28 DIAGNOSIS — J690 Pneumonitis due to inhalation of food and vomit: Secondary | ICD-10-CM

## 2014-07-28 DIAGNOSIS — R1310 Dysphagia, unspecified: Secondary | ICD-10-CM | POA: Diagnosis not present

## 2014-07-29 ENCOUNTER — Other Ambulatory Visit: Payer: Self-pay | Admitting: *Deleted

## 2014-07-29 MED ORDER — POLYETHYLENE GLYCOL 3350 17 GM/SCOOP PO POWD: 17.0000 g | Freq: Every day | ORAL | Status: DC

## 2014-07-31 ENCOUNTER — Ambulatory Visit (HOSPITAL_BASED_OUTPATIENT_CLINIC_OR_DEPARTMENT_OTHER): Payer: Medicaid Other | Admitting: Physical Medicine & Rehabilitation

## 2014-07-31 ENCOUNTER — Encounter: Payer: Medicaid Other | Attending: Physical Medicine & Rehabilitation

## 2014-07-31 ENCOUNTER — Encounter: Payer: Self-pay | Admitting: Physical Medicine & Rehabilitation

## 2014-07-31 VITALS — BP 132/80 | HR 62 | Resp 16

## 2014-07-31 DIAGNOSIS — G8110 Spastic hemiplegia affecting unspecified side: Secondary | ICD-10-CM

## 2014-07-31 DIAGNOSIS — R482 Apraxia: Secondary | ICD-10-CM | POA: Diagnosis not present

## 2014-07-31 DIAGNOSIS — I69390 Apraxia following cerebral infarction: Secondary | ICD-10-CM

## 2014-07-31 NOTE — Patient Instructions (Addendum)
If the hand gets more tight on the right side please call for a Botox injection  If right shoulder pain increases you may need an injection of cortisone  Continue range of motion for right arm and leg

## 2014-07-31 NOTE — Progress Notes (Signed)
Subjective:    Patient ID: [name], female    DOB: October 18, 1960, 54 y.o.   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 The patient went through inpatient rehabilitation and then went Home with home health PT OT speech. Her daughter provided 24 hour care for her. Patient has completed outpatient therapy. She is followed up with neurology as well as with primary care. Patient did have an episode last month where she felt her swallowing was worse, she underwent F EES by a speech pathologist and this was normal. She also had an episode of staring and went to the emergency department in April 2016. EEG was performed and unchanged compared to prior EEG in March which just showed some slowing but no epileptiform discharges  Currently she has a home health aide to assist with bathing and dressing. The patient can do some limited upper extremity dressing. She needs caregiver assistance for all other self-care. In terms of mobility ambulates with a hemiwalker short household distances such as into the bathroom. She uses a wheelchair for community distances. Daughter denies any falls. In terms of a aphasia patient continues with severe language deficits she is essentially nonverbal but can nod some yes and no's.  Pain Inventory Average Pain 4 Pain Right Now 0 My pain is constant, dull and aching  In the last 24 hours, has pain interfered with the following? General activity 3 Relation with others 3 Enjoyment of life 4 What TIME of day is your pain at its worst? evening and night Sleep (in general) NA  Pain is worse with: walking, bending, sitting and standing Pain improves with: rest Relief from Meds: 4  Mobility walk with assistance use a walker ability to climb steps?  no do you drive?  no use a wheelchair needs help with transfers  Function disabled: date  disabled . I need assistance with the following:  feeding, dressing, bathing, toileting, meal prep, household duties and shopping  Neuro/Psych trouble walking  Prior Studies Any changes since last visit?  no  Physicians involved in your care Any changes since last visit?  no   Family History  Problem Relation Age of Onset  . Hypertension Mother   . Stroke Father   . Hypertension Father   . Diabetes Father    History   Social History  . Marital Status: Single    Spouse Name: N/A  . Number of Children: 3  . Years of Education: 12   Occupational History  . disabled    Social History Main Topics  . Smoking status: Never Smoker   . Smokeless tobacco: Never Used  . Alcohol Use: No  . Drug Use: No  . Sexual Activity: Not on file   Other Topics Concern  . None   Social History Narrative   Patient is single with 3 children.   Patient is right handed.   Patient has hs education.   Patient does not drink caffeine.   Past Surgical History  Procedure Laterality Date  . Cesarean section  ~ 1987  . Tubal ligation    . Tee without cardioversion N/A 12/08/2013    Procedure: TRANSESOPHAGEAL ECHOCARDIOGRAM (TEE);  Surgeon: Candee Furbish, MD;  Location: [facility] ENDOSCOPY;  Service: Cardiovascular;  Laterality: N/A;   Past Medical History  Diagnosis Date  . Hypertension   . Depression   . Diabetes mellitus     ?type (  05/29/2013)  . Bipolar affective disorder   . Schizophrenia     [name] 11/28/2007 (05/29/2013)  . Stroke 05/27/2013   BP 132/80 mmHg  Pulse 62  Resp 16  SpO2 100%  Opioid Risk Score:   Fall Risk Score: Moderate Fall Risk (6-13 points) (educated and given handout)`1  Depression screen PHQ 2/9  Depression screen [facility] 2/9 06/04/2014 08/22/2013 08/22/2013  Decreased Interest 1 0 0  Down, Depressed, Hopeless 1 0 0  PHQ - 2 Score 2 0 0  Altered sleeping 1 - -  Tired, decreased energy 2 - -  Change in appetite 2 - -  Feeling bad or failure about yourself  0 - -    Trouble concentrating 2 - -  Moving slowly or fidgety/restless 0 - -  Suicidal thoughts 0 - -  PHQ-9 Score 9 - -    Review of Systems  Gastrointestinal: Positive for diarrhea and constipation.  Musculoskeletal: Positive for gait problem.  All other systems reviewed and are negative.      Objective:   Physical Exam  Motor strength is 0/5 in the right deltoid, biceps, triceps, grip 2 minus in the right hip flexors 3 minus knee extensor 2 minus ankle plantar flexor Sensation difficult to assess secondary to severe expressive aphasia. Musculoskeletal patient has one finger breath subluxation inferiorly right glenohumeral. She has pain with shoulder abduction and external rotation Tone Ashworth grade 3 in the finger flexors 2 at the wrist flexors 2 at the biceps Deep tendon reflexes hyperactive at the right patellar Gen. He acute distress Mood and affect without lability or agitation, she is smiling Speech nonverbal unable to name, difficulty with two-step commands. She needs some gestural cues for one-step commands.      Assessment & Plan:  1. Right spastic hemiplegia, and aphasia, apraxia approximately one year post left MCA infarct. As I discussed with patient her deficits have plateaued. She will continue to require assistance with ADLs, she has had some improvements with mobility and is now ambulating without physical assistance household distances I do not think any further PT or OT are indicated at this time she needs keep up with her stretching program and ambulate with assistive device as often as possible. 2. Right shoulder pain chronic, combination of adhesive capsulitis plus minus subluxation. If this worsens to the point where she has difficulty with her ADLs, would recommend corticosteroid injection 3. Right finger flexor and wrist flexor tone, at the current time she can get her hand washed as well as cut her fingernails. If tone increases to the point where she is no  longer able to do this, would recommend Botox injection. In the meantime where resting hand splint daily at bedtime  Discussed that no PMNR follow-up will be needed unless she experiences some decline as noted above. Recommend follow-up with neurology Recommend follow-up with PCP  Over half of the 25 min visit was spent counseling and coordinating care.

## 2014-08-14 ENCOUNTER — Other Ambulatory Visit: Payer: Self-pay | Admitting: *Deleted

## 2014-08-14 DIAGNOSIS — E1149 Type 2 diabetes mellitus with other diabetic neurological complication: Secondary | ICD-10-CM

## 2014-08-14 MED ORDER — GABAPENTIN 100 MG PO CAPS: 100.0000 mg | ORAL_CAPSULE | Freq: Three times a day (TID) | ORAL | Status: DC

## 2014-08-14 MED ORDER — HYDROCHLOROTHIAZIDE 25 MG PO TABS: 25.0000 mg | ORAL_TABLET | Freq: Every day | ORAL | Status: DC

## 2014-08-26 ENCOUNTER — Telehealth: Payer: Self-pay | Admitting: Internal Medicine

## 2014-08-26 NOTE — Telephone Encounter (Signed)
Patient's daughter dropped off paperwork for patient's PCP to fill out for the [facility], from needs to be completed and faxed back to [facility] [place] Fx: [phone], form was placed in PCP's folder

## 2014-08-27 ENCOUNTER — Telehealth: Payer: Self-pay | Admitting: Internal Medicine

## 2014-08-28 ENCOUNTER — Other Ambulatory Visit: Payer: Self-pay | Admitting: Internal Medicine

## 2014-08-31 ENCOUNTER — Telehealth: Payer: Self-pay | Admitting: Internal Medicine

## 2014-08-31 DIAGNOSIS — G8110 Spastic hemiplegia affecting unspecified side: Secondary | ICD-10-CM

## 2014-08-31 NOTE — Telephone Encounter (Signed)
   [facility] calling to follow upon faxed refill request for baclofen (LIORESAL) 10 MG tablet and Insulin Glargine (LANTUS SOLOSTAR) 100 UNIT/ML Solostar Pen. Please f/u with pt's pharmacy.

## 2014-09-14 ENCOUNTER — Other Ambulatory Visit: Payer: Self-pay

## 2014-09-18 ENCOUNTER — Other Ambulatory Visit: Payer: Self-pay | Admitting: Internal Medicine

## 2014-09-28 ENCOUNTER — Other Ambulatory Visit: Payer: Self-pay

## 2014-09-28 DIAGNOSIS — Z1231 Encounter for screening mammogram for malignant neoplasm of breast: Secondary | ICD-10-CM

## 2014-09-29 ENCOUNTER — Telehealth: Payer: Self-pay | Admitting: Internal Medicine

## 2014-09-29 NOTE — Telephone Encounter (Signed)
Patient's daughter came into clinic to drop off paperwork for PCP to fill out, Placed in provider's box

## 2014-10-05 DIAGNOSIS — Z0289 Encounter for other administrative examinations: Secondary | ICD-10-CM

## 2014-10-15 ENCOUNTER — Ambulatory Visit: Payer: Medicaid Other | Admitting: Physical Medicine & Rehabilitation

## 2014-10-16 ENCOUNTER — Other Ambulatory Visit: Payer: Self-pay | Admitting: Internal Medicine

## 2014-10-20 ENCOUNTER — Encounter: Payer: Self-pay | Admitting: Internal Medicine

## 2014-10-20 ENCOUNTER — Ambulatory Visit: Payer: Medicaid Other | Attending: Internal Medicine | Admitting: Internal Medicine

## 2014-10-20 VITALS — BP 129/79 | HR 62 | Temp 98.3°F | Resp 18 | Ht 65.0 in

## 2014-10-20 DIAGNOSIS — Z794 Long term (current) use of insulin: Secondary | ICD-10-CM | POA: Insufficient documentation

## 2014-10-20 DIAGNOSIS — M79671 Pain in right foot: Secondary | ICD-10-CM | POA: Diagnosis not present

## 2014-10-20 DIAGNOSIS — M25511 Pain in right shoulder: Secondary | ICD-10-CM | POA: Diagnosis not present

## 2014-10-20 DIAGNOSIS — E1149 Type 2 diabetes mellitus with other diabetic neurological complication: Secondary | ICD-10-CM | POA: Diagnosis not present

## 2014-10-20 LAB — POCT GLYCOSYLATED HEMOGLOBIN (HGB A1C): HEMOGLOBIN A1C: 5.7

## 2014-10-20 LAB — GLUCOSE, POCT (MANUAL RESULT ENTRY): POC Glucose: 106 mg/dl — AB (ref 70–99)

## 2014-10-20 NOTE — Progress Notes (Signed)
Daughter reports right shoulder hurts and right foot hurts for one week. Patient reports pain by touching nurses fingers for ain scale. Patient reports right shoulder pain at level 5 and right foot pain at level 3.   Daughter reports patient drooling a lot, patient pointed to right cheek and grimaced today.   Daughter thinks arm is dislocated.   Patient had stroke in March of 2015 and can not talk.

## 2014-10-20 NOTE — Progress Notes (Signed)
Patient ID: [name], female   DOB: 02-Jul-1960, 54 y.o.   MRN: [id number]  CC: right shoulder pain   HPI: [name] is a 54 y.o. female here today for a follow up visit.  Patient has past medical history of T2DM, CVA (2015), Schizophrenia, HTN, depression. Patient presents with daughter who gives history due to patients global aphasia. Daughter states that patients has been complaining of pain in right shoulder and right foot for one week. Daughter feels like patients arm is dislocated. Upon review of chart I found that Dr. Dianna Limbo has been evaluating patients right arm pain and has given a intra-articular injections for adhesive capsulitis of right shoulder. Patient has been offered subsequent injections but declined at the time with Dr. Read Drivers.  Right foot pain. Daughter states that patient is requesting to be placed in wheelchair versus her walking with walker for several days. She states that when patient walks she grimaces from pain that appears to be in her right foot. Unable to get pain description from patient.  Daughter is concerned about patient drooling more often. Patient is currently swallowing food fine and has had a swallow evaluation 3 months ago.   No Known Allergies Past Medical History  Diagnosis Date  . Hypertension   . Depression   . Diabetes mellitus     ?type (05/29/2013)  . Bipolar affective disorder   . Schizophrenia     [name] 11/28/2007 (05/29/2013)  . Stroke 05/27/2013   Current Outpatient Prescriptions on File Prior to Visit  Medication Sig Dispense Refill  . ACCU-CHEK AVIVA PLUS test strip USE 1 STRIP TO TEST BEFORE MEALS AND AT BEDTIME 100 each 9  . ACCU-CHEK SOFTCLIX LANCETS lancets TEST BEFORE MEALS AND AT BEDTIME 100 each 9  . acetaminophen (TYLENOL) 325 MG tablet Take 1-2 tablets (325-650 mg total) by mouth every 4 (four) hours as needed for mild pain (available over the counter).    Marland Kitchen amLODipine (NORVASC) 10 MG tablet Take 1 tablet (10 mg  total) by mouth daily. For blood pressure 90 tablet 3  . aspirin 325 MG tablet Take 325 mg by mouth daily.    . baclofen (LIORESAL) 10 MG tablet Take 1 tablet (10 mg total) by mouth 2 (two) times daily. To help with tone/spasms 60 tablet 1  . gabapentin (NEURONTIN) 100 MG capsule Take 1 capsule (100 mg total) by mouth 3 (three) times daily. 90 capsule 1  . hydrochlorothiazide (HYDRODIURIL) 25 MG tablet Take 1 tablet (25 mg total) by mouth daily. 30 tablet 2  . Insulin Glargine (LANTUS SOLOSTAR) 100 UNIT/ML Solostar Pen Inject 10 Units into the skin 2 (two) times daily. With breakfast and supper 15 pen   . Insulin Pen Needle 31G X 6 MM MISC Use as prescribed by physician 100 each 12  . metoprolol (LOPRESSOR) 100 MG tablet Take 1 tablet (100 mg total) by mouth 3 (three) times daily. 90 tablet 4  . polyethylene glycol powder (GLYCOLAX/MIRALAX) powder Take 17 g by mouth daily. 527 g 3  . simvastatin (ZOCOR) 20 MG tablet Take 1 tablet (20 mg total) by mouth daily at 6 PM. 30 tablet 3  . ciprofloxacin (CIPRO) 500 MG tablet Take 1 tablet (500 mg total) by mouth 2 (two) times daily. (Patient not taking: Reported on 10/20/2014) 6 tablet 0  . glycerin adult (GLYCERIN ADULT) 2 G SUPP Place 1 suppository rectally once as needed for moderate constipation. (Patient not taking: Reported on 10/20/2014) 10 suppository 0   No current  facility-administered medications on file prior to visit.   Family History  Problem Relation Age of Onset  . Hypertension Mother   . Stroke Father   . Hypertension Father   . Diabetes Father    History   Social History  . Marital Status: Single    Spouse Name: N/A  . Number of Children: 3  . Years of Education: 12   Occupational History  . disabled    Social History Main Topics  . Smoking status: Never Smoker   . Smokeless tobacco: Never Used  . Alcohol Use: No  . Drug Use: No  . Sexual Activity: Not on file   Other Topics Concern  . Not on file   Social History  Narrative   Patient is single with 3 children.   Patient is right handed.   Patient has hs education.   Patient does not drink caffeine.    Review of Systems: Unable to give due to aphasia  Objective:   Filed Vitals:   10/20/14 1637  BP: 129/79  Pulse: 62  Temp: 98.3 F (36.8 C)  Resp: 18    Physical Exam  Constitutional: She is oriented to person, place, and time.  Cardiovascular: Normal rate, regular rhythm and normal heart sounds.   Pulmonary/Chest: Effort normal and breath sounds normal.  Musculoskeletal: She exhibits tenderness (patient grimace when touching right ankle, heel, and arch of foot).  Unable to extend right arm at shoulder joint. Stiff with some contracture of right hand and arm at elbow.   Neurological: She is alert and oriented to person, place, and time.  Skin: Skin is warm and dry.     Lab Results  Component Value Date   WBC 4.9 07/15/2014   HGB 14.6 07/15/2014   HCT 43.0 07/15/2014   MCV 89.1 07/15/2014   PLT 237 07/15/2014   Lab Results  Component Value Date   CREATININE 0.80 07/15/2014   BUN 15 07/15/2014   NA 143 07/15/2014   K 3.7 07/15/2014   CL 100 07/15/2014   CO2 32 07/15/2014    Lab Results  Component Value Date   HGBA1C 5.70 10/20/2014   Lipid Panel     Component Value Date/Time   CHOL 105 12/06/2013 0345   TRIG 66 12/06/2013 0345   HDL 42 12/06/2013 0345   CHOLHDL 2.5 12/06/2013 0345   VLDL 13 12/06/2013 0345   LDLCALC 50 12/06/2013 0345       Assessment and plan:   [name] was seen today for shoulder pain and foot pain.  Diagnoses and all orders for this visit:  Right shoulder pain I will refer patient back to Dr. Dianna Limbo for management and possible injections. I have sent Dr. Read Drivers a message in [place] on further options. Currently waiting response.   Right foot pain Orders: -     DG Foot Complete Right; Future Due to patients aphasia I am unable to get a accurate description of pain. I will start by  x-raying the foot to see if I find any abnormalities. She can take ibuprofen as needed or the daughter can give to patient when she notices grimacing upon standing on right foot.   DM (diabetes mellitus) type II controlled, neurological manifestation Orders: -     Glucose (CBG) -     HgB A1c Stable. Patients diabetes is well control as evidence by consistently low a1c.  Patient will continue with current therapy and continue to make necessary lifestyle changes.  Reviewed foot care, diet, exercise,  annual health maintenance with patient.    Return in about 3 months (around 01/20/2015) for Diabetes Mellitus.    Lance Bosch, [place] [facility] [phone] 10/20/2014, 4:58 PM

## 2014-10-27 ENCOUNTER — Other Ambulatory Visit: Payer: Self-pay | Admitting: Internal Medicine

## 2014-11-02 ENCOUNTER — Other Ambulatory Visit: Payer: Self-pay | Admitting: Internal Medicine

## 2014-11-06 MED ORDER — BACLOFEN 10 MG PO TABS: 10.0000 mg | ORAL_TABLET | Freq: Two times a day (BID) | ORAL | Status: DC

## 2014-11-06 MED ORDER — INSULIN GLARGINE 100 UNIT/ML SOLOSTAR PEN: 10.0000 [IU] | PEN_INJECTOR | Freq: Two times a day (BID) | SUBCUTANEOUS | Status: DC

## 2014-11-08 ENCOUNTER — Emergency Department (HOSPITAL_COMMUNITY): Payer: Medicaid Other

## 2014-11-08 ENCOUNTER — Inpatient Hospital Stay (HOSPITAL_COMMUNITY)
Admission: EM | Admit: 2014-11-08 | Discharge: 2014-11-11 | DRG: 392 | Disposition: A | Discharge status: Skilled Nursing Facility | Payer: Medicaid Other | Attending: Internal Medicine | Admitting: Internal Medicine

## 2014-11-08 ENCOUNTER — Inpatient Hospital Stay (HOSPITAL_COMMUNITY): Payer: Medicaid Other

## 2014-11-08 ENCOUNTER — Encounter (HOSPITAL_COMMUNITY): Payer: Self-pay

## 2014-11-08 DIAGNOSIS — E860 Dehydration: Secondary | ICD-10-CM | POA: Diagnosis present

## 2014-11-08 DIAGNOSIS — G839 Paralytic syndrome, unspecified: Secondary | ICD-10-CM | POA: Diagnosis present

## 2014-11-08 DIAGNOSIS — Z79899 Other long term (current) drug therapy: Secondary | ICD-10-CM | POA: Diagnosis not present

## 2014-11-08 DIAGNOSIS — R471 Dysarthria and anarthria: Secondary | ICD-10-CM | POA: Diagnosis present

## 2014-11-08 DIAGNOSIS — Z794 Long term (current) use of insulin: Secondary | ICD-10-CM

## 2014-11-08 DIAGNOSIS — R1110 Vomiting, unspecified: Secondary | ICD-10-CM | POA: Diagnosis present

## 2014-11-08 DIAGNOSIS — G8110 Spastic hemiplegia affecting unspecified side: Secondary | ICD-10-CM | POA: Diagnosis present

## 2014-11-08 DIAGNOSIS — E876 Hypokalemia: Secondary | ICD-10-CM | POA: Diagnosis present

## 2014-11-08 DIAGNOSIS — I69392 Facial weakness following cerebral infarction: Secondary | ICD-10-CM

## 2014-11-08 DIAGNOSIS — I69320 Aphasia following cerebral infarction: Secondary | ICD-10-CM

## 2014-11-08 DIAGNOSIS — I69391 Dysphagia following cerebral infarction: Secondary | ICD-10-CM | POA: Diagnosis not present

## 2014-11-08 DIAGNOSIS — R4182 Altered mental status, unspecified: Secondary | ICD-10-CM

## 2014-11-08 DIAGNOSIS — K5900 Constipation, unspecified: Secondary | ICD-10-CM | POA: Diagnosis present

## 2014-11-08 DIAGNOSIS — E440 Moderate protein-calorie malnutrition: Secondary | ICD-10-CM | POA: Diagnosis present

## 2014-11-08 DIAGNOSIS — R112 Nausea with vomiting, unspecified: Secondary | ICD-10-CM | POA: Diagnosis present

## 2014-11-08 DIAGNOSIS — I639 Cerebral infarction, unspecified: Secondary | ICD-10-CM | POA: Diagnosis present

## 2014-11-08 DIAGNOSIS — Z823 Family history of stroke: Secondary | ICD-10-CM

## 2014-11-08 DIAGNOSIS — R55 Syncope and collapse: Secondary | ICD-10-CM | POA: Diagnosis not present

## 2014-11-08 DIAGNOSIS — Z8249 Family history of ischemic heart disease and other diseases of the circulatory system: Secondary | ICD-10-CM

## 2014-11-08 DIAGNOSIS — R1310 Dysphagia, unspecified: Secondary | ICD-10-CM | POA: Diagnosis not present

## 2014-11-08 DIAGNOSIS — Z7982 Long term (current) use of aspirin: Secondary | ICD-10-CM | POA: Diagnosis not present

## 2014-11-08 DIAGNOSIS — R197 Diarrhea, unspecified: Secondary | ICD-10-CM | POA: Diagnosis not present

## 2014-11-08 DIAGNOSIS — E785 Hyperlipidemia, unspecified: Secondary | ICD-10-CM | POA: Diagnosis present

## 2014-11-08 DIAGNOSIS — E1149 Type 2 diabetes mellitus with other diabetic neurological complication: Secondary | ICD-10-CM | POA: Diagnosis not present

## 2014-11-08 DIAGNOSIS — I63319 Cerebral infarction due to thrombosis of unspecified middle cerebral artery: Secondary | ICD-10-CM

## 2014-11-08 DIAGNOSIS — E119 Type 2 diabetes mellitus without complications: Secondary | ICD-10-CM | POA: Diagnosis present

## 2014-11-08 DIAGNOSIS — I10 Essential (primary) hypertension: Secondary | ICD-10-CM | POA: Diagnosis present

## 2014-11-08 DIAGNOSIS — IMO0002 Reserved for concepts with insufficient information to code with codable children: Secondary | ICD-10-CM

## 2014-11-08 LAB — COMPREHENSIVE METABOLIC PANEL
ALBUMIN: 3.8 g/dL (ref 3.5–5.0)
ALT: 28 U/L (ref 14–54)
ANION GAP: 8 (ref 5–15)
AST: 30 U/L (ref 15–41)
Alkaline Phosphatase: 65 U/L (ref 38–126)
BUN: 13 mg/dL (ref 6–20)
CO2: 32 mmol/L (ref 22–32)
Calcium: 9.5 mg/dL (ref 8.9–10.3)
Chloride: 99 mmol/L — ABNORMAL LOW (ref 101–111)
Creatinine, Ser: 0.81 mg/dL (ref 0.44–1.00)
GFR calc Af Amer: >60 mL/min (ref >60)
GFR calc non Af Amer: >60 mL/min (ref >60)
GLUCOSE: 144 mg/dL — AB (ref 65–99)
POTASSIUM: 3.4 mmol/L — AB (ref 3.5–5.1)
SODIUM: 139 mmol/L (ref 135–145)
Total Bilirubin: 0.5 mg/dL (ref 0.3–1.2)
Total Protein: 7.3 g/dL (ref 6.5–8.1)

## 2014-11-08 LAB — CBC WITH DIFFERENTIAL/PLATELET
BASOS PCT: 0 % (ref 0–1)
Basophils Absolute: 0 10*3/uL (ref 0.0–0.1)
EOS ABS: 0.1 10*3/uL (ref 0.0–0.7)
Eosinophils Relative: 2 % (ref 0–5)
HEMATOCRIT: 40.9 % (ref 36.0–46.0)
Hemoglobin: 14.1 g/dL (ref 12.0–15.0)
LYMPHS ABS: 2.8 10*3/uL (ref 0.7–4.0)
Lymphocytes Relative: 51 % — ABNORMAL HIGH (ref 12–46)
MCH: 30.5 pg (ref 26.0–34.0)
MCHC: 34.5 g/dL (ref 30.0–36.0)
MCV: 88.3 fL (ref 78.0–100.0)
MONOS PCT: 7 % (ref 3–12)
Monocytes Absolute: 0.4 10*3/uL (ref 0.1–1.0)
Neutro Abs: 2.2 10*3/uL (ref 1.7–7.7)
Neutrophils Relative %: 40 % — ABNORMAL LOW (ref 43–77)
Platelets: 219 10*3/uL (ref 150–400)
RBC: 4.63 MIL/uL (ref 3.87–5.11)
RDW: 11.9 % (ref 11.5–15.5)
WBC: 5.5 10*3/uL (ref 4.0–10.5)

## 2014-11-08 LAB — I-STAT TROPONIN, ED: Troponin i, poc: 0 ng/mL (ref 0.00–0.08)

## 2014-11-08 LAB — TROPONIN I: Troponin I: <0.03 ng/mL (ref <0.031)

## 2014-11-08 LAB — GLUCOSE, CAPILLARY
GLUCOSE-CAPILLARY: 105 mg/dL — AB (ref 65–99)
Glucose-Capillary: 95 mg/dL (ref 65–99)

## 2014-11-08 LAB — LIPASE, BLOOD: LIPASE: 24 U/L (ref 22–51)

## 2014-11-08 MED ORDER — ONDANSETRON HCL 4 MG PO TABS: 4.0000 mg | ORAL_TABLET | Freq: Four times a day (QID) | ORAL | Status: DC | PRN

## 2014-11-08 MED ORDER — ACETAMINOPHEN 650 MG RE SUPP: 650.0000 mg | Freq: Four times a day (QID) | RECTAL | Status: DC | PRN

## 2014-11-08 MED ORDER — METOPROLOL TARTRATE 100 MG PO TABS
100.0000 mg | ORAL_TABLET | Freq: Two times a day (BID) | ORAL | Status: DC
  Administered 2014-11-08 – 2014-11-11 (×4): 100 mg via ORAL
  Filled 2014-11-08 (×5): qty 1

## 2014-11-08 MED ORDER — SODIUM CHLORIDE 0.9 % IV BOLUS (SEPSIS)
1000.0000 mL | Freq: Once | INTRAVENOUS | Status: AC
  Administered 2014-11-08: 1000 mL via INTRAVENOUS

## 2014-11-08 MED ORDER — ACETAMINOPHEN 325 MG PO TABS: 650.0000 mg | ORAL_TABLET | Freq: Four times a day (QID) | ORAL | Status: DC | PRN

## 2014-11-08 MED ORDER — POTASSIUM CHLORIDE CRYS ER 20 MEQ PO TBCR
40.0000 meq | EXTENDED_RELEASE_TABLET | Freq: Once | ORAL | Status: AC
  Administered 2014-11-08: 40 meq via ORAL
  Filled 2014-11-08: qty 2

## 2014-11-08 MED ORDER — SIMVASTATIN 20 MG PO TABS: 20.0000 mg | ORAL_TABLET | Freq: Every day | ORAL | Status: DC

## 2014-11-08 MED ORDER — ASPIRIN EC 325 MG PO TBEC
325.0000 mg | DELAYED_RELEASE_TABLET | Freq: Every day | ORAL | Status: DC
  Administered 2014-11-10 – 2014-11-11 (×2): 325 mg via ORAL
  Filled 2014-11-08 (×3): qty 1

## 2014-11-08 MED ORDER — BACLOFEN 10 MG PO TABS
10.0000 mg | ORAL_TABLET | Freq: Two times a day (BID) | ORAL | Status: DC
  Administered 2014-11-08 – 2014-11-11 (×5): 10 mg via ORAL
  Filled 2014-11-08 (×6): qty 1

## 2014-11-08 MED ORDER — AMLODIPINE BESYLATE 10 MG PO TABS
10.0000 mg | ORAL_TABLET | Freq: Every day | ORAL | Status: DC
  Administered 2014-11-10 – 2014-11-11 (×2): 10 mg via ORAL
  Filled 2014-11-08 (×3): qty 1

## 2014-11-08 MED ORDER — BISACODYL 10 MG RE SUPP
10.0000 mg | Freq: Once | RECTAL | Status: AC
  Administered 2014-11-08: 10 mg via RECTAL
  Filled 2014-11-08: qty 1

## 2014-11-08 MED ORDER — INSULIN ASPART 100 UNIT/ML ~~LOC~~ SOLN
0.0000 [IU] | SUBCUTANEOUS | Status: DC
  Administered 2014-11-09: 3 [IU] via SUBCUTANEOUS
  Administered 2014-11-10: 2 [IU] via SUBCUTANEOUS

## 2014-11-08 MED ORDER — SODIUM CHLORIDE 0.9 % IV SOLN: INTRAVENOUS | Status: AC

## 2014-11-08 MED ORDER — OXYCODONE HCL 5 MG PO TABS: 5.0000 mg | ORAL_TABLET | ORAL | Status: DC | PRN

## 2014-11-08 MED ORDER — ONDANSETRON HCL 4 MG/2ML IJ SOLN: 4.0000 mg | Freq: Four times a day (QID) | INTRAMUSCULAR | Status: DC | PRN

## 2014-11-08 MED ORDER — ENOXAPARIN SODIUM 30 MG/0.3ML ~~LOC~~ SOLN
30.0000 mg | SUBCUTANEOUS | Status: DC
  Administered 2014-11-08 – 2014-11-10 (×3): 30 mg via SUBCUTANEOUS
  Filled 2014-11-08 (×3): qty 0.3

## 2014-11-08 MED ORDER — SODIUM CHLORIDE 0.9 % IJ SOLN
3.0000 mL | Freq: Two times a day (BID) | INTRAMUSCULAR | Status: DC
  Administered 2014-11-09: 10 mL via INTRAVENOUS
  Administered 2014-11-10 – 2014-11-11 (×2): 3 mL via INTRAVENOUS

## 2014-11-08 MED ORDER — SIMVASTATIN 20 MG PO TABS
20.0000 mg | ORAL_TABLET | Freq: Every day | ORAL | Status: DC
  Administered 2014-11-08 – 2014-11-10 (×3): 20 mg via ORAL
  Filled 2014-11-08 (×3): qty 1

## 2014-11-08 MED ORDER — SODIUM CHLORIDE 0.9 % IV SOLN
INTRAVENOUS | Status: DC
  Administered 2014-11-08 – 2014-11-10 (×4): via INTRAVENOUS
  Administered 2014-11-10: 1000 mL via INTRAVENOUS

## 2014-11-08 NOTE — ED Notes (Signed)
Pt remains monitored by blood pressure, pulse ox, and 12 lead. pts family remains at bedside.  

## 2014-11-08 NOTE — ED Notes (Signed)
Pt placed on monitor upon return from radiology. Pt remains monitored by blood pressure, pulse ox, and 12 lead.

## 2014-11-08 NOTE — H&P (Addendum)
[facility] History and Physical  [name] ZOX:[id number] DOB: 05-15-1960 DOA: 11/08/2014  Referring physician:  PCP: Lance Bosch, NP   Chief Complaint:   HPI: [name] is a 54 y.o. 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 Family members reporting that patient required a PEG tube after CVA however this was later removed. Over the past week her daughter has noted increasing difficulties in swallowing food which has been progressive over the past week. She reports noticing patient choking on her food and having increased drooling along with holding food within her mouth in the last several days. Throughout this time patient has become increasingly weak, feeling faint and reportedly had 3 syncopal spells today. Her daughter does not believe she is taking any by mouth down. She denies worsening facial droop or right-sided hemiparesis, nor does she report other new focal neurological deficits. She was worked up with a CT scan of brain without contrast in the emergency department that showed a stable remote large left middle cerebral artery distribution stroke and remote stroke involving the left anterior pons. Chest x-ray did not show acute infiltrate. Patient denies fevers, chills, cough, shortness of breath, chest pain, abdominal pain.                                           Review of Systems:  Constitutional:  No weight loss, night sweats, Fevers, chills, fatigue.  HEENT:  No headaches, Difficulty swallowing,Tooth/dental problems,Sore throat,  No sneezing, itching, ear ache, nasal congestion, post nasal drip,  Cardio-vascular:  No chest pain, Orthopnea, PND, swelling in lower extremities,  anasarca, dizziness, palpitations  GI:  No heartburn, indigestion, abdominal pain, nausea, vomiting, diarrhea, change in bowel habits, loss of appetite  Resp:  No shortness of breath with exertion or at rest. No excess mucus, no productive cough, No non-productive cough, No coughing up of blood.No change in color of mucus.No wheezing.No chest wall deformity  Skin:  no rash or lesions.  GU:  no dysuria, change in color of urine, no urgency or frequency. No flank pain.  Musculoskeletal:  No joint pain or swelling. No decreased range of motion. No back pain.  Psych:  No change in mood or affect. No depression or anxiety. No memory loss.   Past Medical History  Diagnosis Date  . Hypertension   . Depression   . Diabetes mellitus     ?type (05/29/2013)  . Bipolar affective disorder   . Schizophrenia     [name] 11/28/2007 (05/29/2013)  . Stroke 05/27/2013   Past Surgical History  Procedure Laterality Date  . Cesarean section  ~ 1987  . Tubal ligation    . Tee without cardioversion N/A 12/08/2013    Procedure: TRANSESOPHAGEAL ECHOCARDIOGRAM (TEE);  Surgeon: Candee Furbish, MD;  Location: [facility] ENDOSCOPY;  Service: Cardiovascular;  Laterality: N/A;   Social History:  reports that she has never smoked. She has never used smokeless tobacco. She reports that she does not drink alcohol or use illicit drugs.  No Known Allergies  Family History  Problem Relation Age of Onset  . Hypertension Mother   . Stroke Father   . Hypertension Father   .  Diabetes Father     Prior to Admission medications   Medication Sig Start Date End Date Taking? Authorizing Provider  acetaminophen (TYLENOL) 325 MG tablet Take 1-2 tablets (325-650 mg total) by mouth every 4 (four) hours as needed for mild pain (available over the counter). 07/08/13  Yes Ivan Anchors Love, PA-C  amLODipine (NORVASC) 10 MG tablet Take 1 tablet (10 mg total) by mouth daily. For blood pressure 03/11/14  Yes Lance Bosch, NP  aspirin 325 MG  tablet Take 325 mg by mouth daily.   Yes Historical Provider, MD  baclofen (LIORESAL) 10 MG tablet Take 1 tablet (10 mg total) by mouth 2 (two) times daily. To help with tone/spasms 11/06/14  Yes Lance Bosch, NP  gabapentin (NEURONTIN) 100 MG capsule Take 1 capsule (100 mg total) by mouth 3 (three) times daily. 08/14/14  Yes Lance Bosch, NP  hydrochlorothiazide (HYDRODIURIL) 25 MG tablet Take 1 tablet (25 mg total) by mouth daily. 08/14/14  Yes Lance Bosch, NP  Insulin Glargine (LANTUS SOLOSTAR) 100 UNIT/ML Solostar Pen Inject 10 Units into the skin 2 (two) times daily. With breakfast and supper Patient taking differently: Inject 10 Units into the skin 2 (two) times daily as needed (CBG>170). With breakfast and supper if needed for high blood sugar 11/06/14  Yes Lance Bosch, NP  metoprolol (LOPRESSOR) 100 MG tablet TAKE 1 TABLET BY MOUTH 3 TIMES DAILY. 10/29/14  Yes Lance Bosch, NP  polyethylene glycol powder (GLYCOLAX/MIRALAX) powder Take 17 g by mouth daily. Patient taking differently: Take 17 g by mouth daily as needed (constipation).  07/29/14  Yes Lance Bosch, NP  senna (SENNA LAX) 8.6 MG tablet Take 1 tablet by mouth daily as needed for constipation.   Yes Historical Provider, MD  simvastatin (ZOCOR) 20 MG tablet TAKE 1 TABLET BY MOUTH DAILY AT 6 PM 10/29/14  Yes Lance Bosch, NP  ACCU-CHEK AVIVA PLUS test strip USE 1 STRIP TO TEST BEFORE MEALS AND AT BEDTIME 09/18/14   Tresa Garter, MD  ACCU-CHEK SOFTCLIX LANCETS lancets TEST BEFORE MEALS AND AT BEDTIME 09/18/14   Tresa Garter, MD  glycerin adult (GLYCERIN ADULT) 2 G SUPP Place 1 suppository rectally once as needed for moderate constipation. Patient not taking: Reported on 10/20/2014 05/31/14   Linton Flemings, MD  Insulin Pen Needle 31G X 6 MM MISC Use as prescribed by physician 03/03/14   Lance Bosch, NP   Physical Exam: Filed Vitals:   11/08/14 1640 11/08/14 1645 11/08/14 1700 11/08/14 1715  BP: 141/83 138/78 136/85  141/77  Pulse: 68 66 67 64  Temp:      TempSrc:      Resp: 12 17 13 16   SpO2: 100% 100% 100% 100%    Wt Readings from Last 3 Encounters:  07/09/14 50.349 kg (111 lb)  06/01/14 49.624 kg (109 lb 6.4 oz)  12/22/13 59.875 kg (132 lb)    General:  Appears calm and comfortable, no acute distress has significant right-sided facial droop, having slurred speech with Valerie's reporting this is baseline. Eyes: PERRL, normal lids, irises & conjunctiva ENT: grossly normal hearing, lips & tongue Neck: no LAD, masses or thyromegaly Cardiovascular: RRR, no m/r/g. No LE edema. Telemetry: SR, no arrhythmias  Respiratory: CTA bilaterally, no w/r/r. Normal respiratory effort. Abdomen: soft, ntnd Skin: no rash or induration seen on limited exam Musculoskeletal: grossly normal tone BUE/BLE Psychiatric: grossly normal mood and affect, speech fluent and appropriate Neurologic: She has markedly right-sided  facial droop associated with slurred speech. There is no tongue deviation. There was complete paralysis of her right upper extremity with her right lower extremity having 3-5 muscle strength. There was 5 of 5 muscle strength to left upper left lower extremities.           Labs on Admission:  Basic Metabolic Panel:  Recent Labs Lab 11/08/14 1355  NA 139  K 3.4*  CL 99*  CO2 32  GLUCOSE 144*  BUN 13  CREATININE 0.81  CALCIUM 9.5   Liver Function Tests:  Recent Labs Lab 11/08/14 1355  AST 30  ALT 28  ALKPHOS 65  BILITOT 0.5  PROT 7.3  ALBUMIN 3.8    Recent Labs Lab 11/08/14 1355  LIPASE 24   No results for input(s): AMMONIA in the last 168 hours. CBC:  Recent Labs Lab 11/08/14 1355  WBC 5.5  NEUTROABS 2.2  HGB 14.1  HCT 40.9  MCV 88.3  PLT 219   Cardiac Enzymes: No results for input(s): CKTOTAL, CKMB, CKMBINDEX, TROPONINI in the last 168 hours.  BNP (last 3 results) No results for input(s): BNP in the last 8760 hours.  ProBNP (last 3 results) No results for  input(s): PROBNP in the last 8760 hours.  CBG: No results for input(s): GLUCAP in the last 168 hours.  Radiological Exams on Admission: Ct Head Wo Contrast  11/08/2014   CLINICAL DATA:  54 year old who describes multiple near syncopal episodes today. Personal history of left MCA territory stroke with residual right-sided deficits and 8 aphasia. New onset of dysphagia which began approximately 1 week ago.  EXAM: CT HEAD WITHOUT CONTRAST  TECHNIQUE: Contiguous axial images were obtained from the base of the skull through the vertex without intravenous contrast.  COMPARISON:  Multiple prior head CTs dating back to 02/02/2007, most recently 07/15/2014. MRI brain 12/05/2013, 05/27/2013.  FINDINGS: Large remote left middle cerebral artery distribution stroke with developing encephalomalacia in the left frontal, temporal and parietal lobes, unchanged. Ex vacuo enlargement of the left lateral ventricle, unchanged. Ventricles otherwise normal in appearance. Moderate changes of small vessel disease of the white matter, unchanged. Remote stroke involving the left anterior pons, unchanged. No mass lesion. No midline shift. No acute hemorrhage or hematoma. No extra-axial fluid collections. No evidence of acute infarction.  No skull fracture or other focal osseous abnormality involving the skull. Visualized paranasal sinuses, bilateral mastoid air cells and bilateral middle ear cavities well-aerated. Moderate bilateral carotid siphon atherosclerosis.  IMPRESSION: 1. No acute intracranial abnormality. 2. Stable remote large left middle cerebral artery distribution stroke and remote stroke involving the left anterior pons. 3. Stable moderate chronic microvascular ischemic changes of the white matter.   Electronically Signed   By: Evangeline Dakin M.D.   On: 11/08/2014 15:47   Dg Chest Port 1 View  11/08/2014   CLINICAL DATA:  Near syncope today, pt denies, pain, weakness, SOB. Constipation recentlyHx of stroke, right  sided weakness/ asphasiaHx of DM, HTN  EXAM: PORTABLE CHEST - 1 VIEW  COMPARISON:  07/15/2014  FINDINGS: Cardiac silhouette is normal in size and configuration. No mediastinal or hilar masses or evidence of adenopathy. Lungs are clear. No pleural effusion or pneumothorax.  Skeletal structures are unremarkable but grossly intact.  IMPRESSION: No acute cardiopulmonary disease.   Electronically Signed   By: Lajean Manes M.D.   On: 11/08/2014 14:48   Dg Abd Portable 1v  11/08/2014   CLINICAL DATA:  Constipation.  Near syncope.  EXAM: PORTABLE ABDOMEN - 1 VIEW  COMPARISON:  None.  FINDINGS: No evidence of dilated small bowel loops. Large amount of stool is seen throughout the course the colon. Pelvic phleboliths noted.  IMPRESSION: No acute findings.  Large stool burden noted.   Electronically Signed   By: Earle Gell M.D.   On: 11/08/2014 14:45    EKG: Independently reviewed.   Assessment/Plan Principal Problem:   Dysphagia Active Problems:   Syncope   Dehydration   DM (diabetes mellitus) type II controlled, neurological manifestation   Facial droop due to stroke   CVA (cerebral infarction)   Essential hypertension   1. New onset dysphagia. Patient having a history of CVA and required a PEG tube placement at the time of her CVA which was later removed. Her daughter reports having issues taking by mouth well with a course of the week and seems progressive. Consequently she has become dehydrated. Will make her nothing by mouth and provide IV fluid resuscitation. CT scan of brain performed in the emergency department was stable. Will obtain an MRI of brain along with a speech pathology consultation to assess her swallow function. 2. Syncope. She had 3 brief syncopal events today which likely has resulted from dehydration. Doubt its of cardiac origin however we'll cycle troponins and place her on continuous cardiac monitoring. She was bolused with 1 L of normal saline in the emergency department and  will continue maintenance fluids at 125 mL/hour. CT scan of brain was stable. 3. History of left middle cerebral artery distribution CVA. As mentioned above initial CT scan of brain stable. She presents with a one-week history of dysphasia becoming progressively worse. Will follow-up with MRI of the brain. Continue aspirin 325 mg by mouth daily and Zocor 20 mg by mouth daily 4. Dehydration. Likely secondary to minimal by mouth intake from dysphasia. Providing IV fluid resuscitation with normal saline. 5. Dyslipidemia. Continue statin therapy 6. Hypokalemia. Will replace with potassium chloride 7. Constipation. Family reporting patient not having a bowel movement in 1 week. Will provide Dulcolax suppository 8. DVT prophylaxis. Lovenox   Code Status: Full code Family Communication: Spoke with her daughter was present at bedside Disposition Plan: Will admit patient to inpatient service, anticipate she'll require greater than 2 nights hospitalization  Time spent: 70 minutes  Kelvin Cellar [facility] Pager [phone]

## 2014-11-08 NOTE — ED Notes (Signed)
Pt placed into gown and on monitor upon arrival to room. Pt monitored by blood pressure, pulse ox, and 12 lead. pts EKG given to and signed by Dr. Darl Householder

## 2014-11-08 NOTE — ED Provider Notes (Signed)
CSN: [id number]     Arrival date & time 11/08/14  1307 History   First MD Initiated Contact with Patient 11/08/14 1322     Chief Complaint  Patient presents with  . Near Syncope     (Consider location/radiation/quality/duration/timing/severity/associated sxs/prior Treatment) The history is provided by the EMS personnel and a relative.  [name] is a 54 y.o. female hx of DM, aphasia and R sided weakness from previous stroke from home, here with near syncope, vomiting. She has been vomiting for the last week or so. Daughter states that patient has not been able to keep anything down for a week. She also seems to gag whenever she swallows anything. She has baseline dysphasia and had a feeding tube that was removed. Today, she was sitting up and suddenly became altered and lowered her head down for 2 minutes. She was then confused for several minutes but daughter did not notice any seizure activity. Her thought she may have passed out. She had 2 episodes since then and she called EMS and brought to the ER. Patient has baseline right-sided weakness and a aphasia that has not changed. Patient does not walk at baseline. She was constipated for a week.    Level V caveat- aphasia from previous stroke   Past Medical History  Diagnosis Date  . Hypertension   . Depression   . Diabetes mellitus     ?type (05/29/2013)  . Bipolar affective disorder   . Schizophrenia     [name] 11/28/2007 (05/29/2013)  . Stroke 05/27/2013   Past Surgical History  Procedure Laterality Date  . Cesarean section  ~ 1987  . Tubal ligation    . Tee without cardioversion N/A 12/08/2013    Procedure: TRANSESOPHAGEAL ECHOCARDIOGRAM (TEE);  Surgeon: Candee Furbish, MD;  Location: [facility] ENDOSCOPY;  Service: Cardiovascular;  Laterality: N/A;   Family History  Problem Relation Age of Onset  . Hypertension Mother   . Stroke Father   . Hypertension Father   . Diabetes Father    Social History  Substance Use Topics  .  Smoking status: Never Smoker   . Smokeless tobacco: Never Used  . Alcohol Use: No   OB History    No data available     Review of Systems  Unable to perform ROS: Patient nonverbal  Cardiovascular: Positive for near-syncope.  Gastrointestinal: Positive for vomiting.      Allergies  Review of patient's allergies indicates no known allergies.  Home Medications   Prior to Admission medications   Medication Sig Start Date End Date Taking? Authorizing Provider  ACCU-CHEK AVIVA PLUS test strip USE 1 STRIP TO TEST BEFORE MEALS AND AT BEDTIME 09/18/14   Tresa Garter, MD  ACCU-CHEK SOFTCLIX LANCETS lancets TEST BEFORE MEALS AND AT BEDTIME 09/18/14   Tresa Garter, MD  acetaminophen (TYLENOL) 325 MG tablet Take 1-2 tablets (325-650 mg total) by mouth every 4 (four) hours as needed for mild pain (available over the counter). 07/08/13   Ivan Anchors Love, PA-C  amLODipine (NORVASC) 10 MG tablet Take 1 tablet (10 mg total) by mouth daily. For blood pressure 03/11/14   Lance Bosch, NP  aspirin 325 MG tablet Take 325 mg by mouth daily.    Historical Provider, MD  baclofen (LIORESAL) 10 MG tablet Take 1 tablet (10 mg total) by mouth 2 (two) times daily. To help with tone/spasms 11/06/14   Lance Bosch, NP  ciprofloxacin (CIPRO) 500 MG tablet Take 1 tablet (500 mg total) by  mouth 2 (two) times daily. Patient not taking: Reported on 10/20/2014 07/22/14   Lance Bosch, NP  gabapentin (NEURONTIN) 100 MG capsule Take 1 capsule (100 mg total) by mouth 3 (three) times daily. 08/14/14   Lance Bosch, NP  glycerin adult (GLYCERIN ADULT) 2 G SUPP Place 1 suppository rectally once as needed for moderate constipation. Patient not taking: Reported on 10/20/2014 05/31/14   Linton Flemings, MD  hydrochlorothiazide (HYDRODIURIL) 25 MG tablet Take 1 tablet (25 mg total) by mouth daily. 08/14/14   Lance Bosch, NP  Insulin Glargine (LANTUS SOLOSTAR) 100 UNIT/ML Solostar Pen Inject 10 Units into the skin 2 (two)  times daily. With breakfast and supper 11/06/14   Lance Bosch, NP  Insulin Pen Needle 31G X 6 MM MISC Use as prescribed by physician 03/03/14   Lance Bosch, NP  metoprolol (LOPRESSOR) 100 MG tablet TAKE 1 TABLET BY MOUTH 3 TIMES DAILY. 10/29/14   Lance Bosch, NP  polyethylene glycol powder (GLYCOLAX/MIRALAX) powder Take 17 g by mouth daily. 07/29/14   Lance Bosch, NP  simvastatin (ZOCOR) 20 MG tablet TAKE 1 TABLET BY MOUTH DAILY AT 6 PM 10/29/14   Lance Bosch, NP   BP 138/81 mmHg  Pulse 70  Temp(Src) 98.4 F (36.9 C) (Oral)  Resp 15  SpO2 100% Physical Exam  Constitutional:  Chronically ill, R facial droop   HENT:  Head: Normocephalic.  MM slightly dry   Eyes: Conjunctivae are normal. Pupils are equal, round, and reactive to light.  Neck: Normal range of motion. Neck supple.  Cardiovascular: Normal rate, regular rhythm and normal heart sounds.   Pulmonary/Chest: Effort normal.  Diminished bilateral bases   Abdominal: Soft. Bowel sounds are normal. She exhibits no distension. There is no tenderness. There is no rebound.  Musculoskeletal: Normal range of motion. She exhibits no edema or tenderness.  Neurological:  Alert, slurred speech, R facial droop and R sided contractures (baseline)   Skin: Skin is warm and dry.  Psychiatric:  Unable   Nursing note and vitals reviewed.   ED Course  Procedures (including critical care time) Labs Review Labs Reviewed  CBC WITH DIFFERENTIAL/PLATELET - Abnormal; Notable for the following:    Neutrophils Relative % 40 (*)    Lymphocytes Relative 51 (*)    All other components within normal limits  COMPREHENSIVE METABOLIC PANEL - Abnormal; Notable for the following:    Potassium 3.4 (*)    Chloride 99 (*)    Glucose, Bld 144 (*)    All other components within normal limits  LIPASE, BLOOD  I-STAT TROPOININ, ED    Imaging Review Ct Head Wo Contrast  11/08/2014   CLINICAL DATA:  54 year old who describes multiple near syncopal  episodes today. Personal history of left MCA territory stroke with residual right-sided deficits and 8 aphasia. New onset of dysphagia which began approximately 1 week ago.  EXAM: CT HEAD WITHOUT CONTRAST  TECHNIQUE: Contiguous axial images were obtained from the base of the skull through the vertex without intravenous contrast.  COMPARISON:  Multiple prior head CTs dating back to 02/02/2007, most recently 07/15/2014. MRI brain 12/05/2013, 05/27/2013.  FINDINGS: Large remote left middle cerebral artery distribution stroke with developing encephalomalacia in the left frontal, temporal and parietal lobes, unchanged. Ex vacuo enlargement of the left lateral ventricle, unchanged. Ventricles otherwise normal in appearance. Moderate changes of small vessel disease of the white matter, unchanged. Remote stroke involving the left anterior pons, unchanged. No mass lesion. No  midline shift. No acute hemorrhage or hematoma. No extra-axial fluid collections. No evidence of acute infarction.  No skull fracture or other focal osseous abnormality involving the skull. Visualized paranasal sinuses, bilateral mastoid air cells and bilateral middle ear cavities well-aerated. Moderate bilateral carotid siphon atherosclerosis.  IMPRESSION: 1. No acute intracranial abnormality. 2. Stable remote large left middle cerebral artery distribution stroke and remote stroke involving the left anterior pons. 3. Stable moderate chronic microvascular ischemic changes of the white matter.   Electronically Signed   By: Evangeline Dakin M.D.   On: 11/08/2014 15:47   Dg Chest Port 1 View  11/08/2014   CLINICAL DATA:  Near syncope today, pt denies, pain, weakness, SOB. Constipation recentlyHx of stroke, right sided weakness/ asphasiaHx of DM, HTN  EXAM: PORTABLE CHEST - 1 VIEW  COMPARISON:  07/15/2014  FINDINGS: Cardiac silhouette is normal in size and configuration. No mediastinal or hilar masses or evidence of adenopathy. Lungs are clear. No pleural  effusion or pneumothorax.  Skeletal structures are unremarkable but grossly intact.  IMPRESSION: No acute cardiopulmonary disease.   Electronically Signed   By: Lajean Manes M.D.   On: 11/08/2014 14:48   Dg Abd Portable 1v  11/08/2014   CLINICAL DATA:  Constipation.  Near syncope.  EXAM: PORTABLE ABDOMEN - 1 VIEW  COMPARISON:  None.  FINDINGS: No evidence of dilated small bowel loops. Large amount of stool is seen throughout the course the colon. Pelvic phleboliths noted.  IMPRESSION: No acute findings.  Large stool burden noted.   Electronically Signed   By: Earle Gell M.D.   On: 11/08/2014 14:45   I have personally reviewed and evaluated these images and lab results as part of my medical decision-making.   EKG Interpretation   Date/Time:  Sunday November 08 2014 13:17:06 EDT Ventricular Rate:  66 PR Interval:  144 QRS Duration: 66 QT Interval:  382 QTC Calculation: 400 R Axis:   132 Text Interpretation:  Right and left arm electrode reversal,  interpretation assumes no reversal Sinus rhythm Probable lateral infarct,  age indeterminate Probable anteroseptal infarct, old No significant change  since last tracing Confirmed by Sojourner Behringer  MD, Ronelle Michie ([id number]) on 11/08/2014  1:31:16 PM      MDM   Final diagnoses:  Altered mental status   [name] is a 54 y.o. female here with vomiting, dysphagia. I wonder if she aspirated from the vomiting. Can also have a new stroke as well. Will get CT head, swallow eval, labs, CXR.   3:53 PM Failed swallow eval. CT head unremarkable. CXR showed no aspiration pneumonia. Will admit for dehydration, syncope, dysphagia (will need speech and swallow eval)    Wandra Arthurs, MD 11/08/14 [id number]

## 2014-11-08 NOTE — ED Notes (Signed)
Pt has been having near-syncopal episodes today - no falling/trauma. Denies pain. Follows commands. Hx stroke - residual right-sided deficits and aphasia. Change over last week has been difficulty swallowing. 12-lead unremarkable, NSR. HR 68, RR 16, BP 148/92, CBG 158.

## 2014-11-08 NOTE — ED Notes (Signed)
Report given to RN on 5W. 

## 2014-11-08 NOTE — ED Notes (Signed)
Pt remains monitored by blood pressure, pulse ox, and 12 lead. Pts family remains at bedside.  

## 2014-11-09 LAB — GLUCOSE, CAPILLARY
GLUCOSE-CAPILLARY: 99 mg/dL (ref 65–99)
GLUCOSE-CAPILLARY: 73 mg/dL (ref 65–99)
Glucose-Capillary: 100 mg/dL — ABNORMAL HIGH (ref 65–99)
Glucose-Capillary: 101 mg/dL — ABNORMAL HIGH (ref 65–99)
Glucose-Capillary: 76 mg/dL (ref 65–99)
Glucose-Capillary: 199 mg/dL — ABNORMAL HIGH (ref 65–99)

## 2014-11-09 LAB — COMPREHENSIVE METABOLIC PANEL
ALBUMIN: 3.5 g/dL (ref 3.5–5.0)
ALT: 23 U/L (ref 14–54)
ANION GAP: 8 (ref 5–15)
AST: 22 U/L (ref 15–41)
Alkaline Phosphatase: 68 U/L (ref 38–126)
BUN: 10 mg/dL (ref 6–20)
CO2: 27 mmol/L (ref 22–32)
Calcium: 9.2 mg/dL (ref 8.9–10.3)
Chloride: 108 mmol/L (ref 101–111)
Creatinine, Ser: 0.72 mg/dL (ref 0.44–1.00)
GFR calc Af Amer: >60 mL/min (ref >60)
GFR calc non Af Amer: >60 mL/min (ref >60)
GLUCOSE: 108 mg/dL — AB (ref 65–99)
POTASSIUM: 3.6 mmol/L (ref 3.5–5.1)
SODIUM: 143 mmol/L (ref 135–145)
Total Bilirubin: 0.8 mg/dL (ref 0.3–1.2)
Total Protein: 6.7 g/dL (ref 6.5–8.1)

## 2014-11-09 LAB — TROPONIN I: Troponin I: <0.03 ng/mL (ref <0.031)

## 2014-11-09 NOTE — Progress Notes (Signed)
[facility] PROGRESS NOTE   [name] DUK:[id number] DOB: 12-16-1960 DOA: 11/08/2014 PCP: Lance Bosch, NP  HPI/Subjective: Has severe expressive aphasia, presented with dysphagia. The only command she followed to me she closed her eyes.  Assessment/Plan: Principal Problem:   Dysphagia Active Problems:   DM (diabetes mellitus) type II controlled, neurological manifestation   Facial droop due to stroke   CVA (cerebral infarction)   Essential hypertension   Syncope   Dehydration   New onset dysphagia Patient having a history of CVA and required a PEG tube placement at the time of her CVA which was later removed.  Her daughter reports having issues taking by mouth well with a course of the week and seems progressive.  Consequently she has become dehydrated.  Will make her nothing by mouth and provide IV fluid resuscitation.  CT and MRI were negative for acute events. SLP to evaluate  Syncope She had 3 brief syncopal events today which likely has resulted from dehydration.  Doubt its of cardiac origin, 3 sets of cardiac enzymes were negative and telemetry negative for arrhythmias She was bolused with 1 L of normal saline in the emergency department and will continue maintenance fluids at 125 mL/hour.  CT scan of brain was stable.  History of left middle cerebral artery distribution CVA As mentioned above initial CT scan of brain stable.  She presents with a one-week history of dysphasia becoming progressively worse.  As mentioned above negative CT and MRI of the brain. Continue aspirin and Zocor.  Dehydration Likely secondary to minimal by mouth intake from dysphasia. Providing IV fluid resuscitation with normal saline.  Dyslipidemia Continue statin therapy  Hypokalemia Will replace with potassium chloride  Constipation Family reporting patient not having a bowel movement in 1 week.  Code Status: Full Code Family Communication: Plan discussed with  the patient. Disposition Plan: Remains inpatient Diet:  Npo. Lovenox for DVT prophylaxis  Consultants:  none  Procedures:  none  Antibiotics:  none   Objective: Filed Vitals:   11/09/14 0529  BP: 130/82  Pulse: 84  Temp: 98.1 F (36.7 C)  Resp: 18    Intake/Output Summary (Last 24 hours) at 11/09/14 1326 Last data filed at 11/09/14 0845  Gross per 24 hour  Intake 1216.67 ml  Output      0 ml  Net 1216.67 ml   Filed Weights   11/08/14 1742  Weight: 50 kg (110 lb 3.7 oz)    Exam: General: Alert and awake, oriented x3, not in any acute distress. HEENT: anicteric sclera, pupils reactive to light and accommodation, EOMI CVS: S1-S2 clear, no murmur rubs or gallops Chest: clear to auscultation bilaterally, no wheezing, rales or rhonchi Abdomen: soft nontender, nondistended, normal bowel sounds, no organomegaly Extremities: no cyanosis, clubbing or edema noted bilaterally Neuro: Cranial nerves II-XII intact, no focal neurological deficits  Data Reviewed: Basic Metabolic Panel:  Recent Labs Lab 11/08/14 1355 11/09/14 0536  NA 139 143  K 3.4* 3.6  CL 99* 108  CO2 32 27  GLUCOSE 144* 108*  BUN 13 10  CREATININE 0.81 0.72  CALCIUM 9.5 9.2   Liver Function Tests:  Recent Labs Lab 11/08/14 1355 11/09/14 0536  AST 30 22  ALT 28 23  ALKPHOS 65 68  BILITOT 0.5 0.8  PROT 7.3 6.7  ALBUMIN 3.8 3.5    Recent Labs Lab 11/08/14 1355  LIPASE 24   No results for input(s): AMMONIA in the last 168 hours. CBC:  Recent Labs Lab  11/08/14 1355  WBC 5.5  NEUTROABS 2.2  HGB 14.1  HCT 40.9  MCV 88.3  PLT 219   Cardiac Enzymes:  Recent Labs Lab 11/08/14 2010 11/09/14 0007 11/09/14 0536  TROPONINI <0.03 <0.03 <0.03   BNP (last 3 results) No results for input(s): BNP in the last 8760 hours.  ProBNP (last 3 results) No results for input(s): PROBNP in the last 8760 hours.  CBG:  Recent Labs Lab 11/08/14 2005 11/09/14 0041 11/09/14 0425  11/09/14 0814 11/09/14 1209  GLUCAP 105* 101* 100* 99 76    Micro No results found for this or any previous visit (from the past 240 hour(s)).   Studies: Ct Head Wo Contrast  11/08/2014   CLINICAL DATA:  54 year old who describes multiple near syncopal episodes today. Personal history of left MCA territory stroke with residual right-sided deficits and 8 aphasia. New onset of dysphagia which began approximately 1 week ago.  EXAM: CT HEAD WITHOUT CONTRAST  TECHNIQUE: Contiguous axial images were obtained from the base of the skull through the vertex without intravenous contrast.  COMPARISON:  Multiple prior head CTs dating back to 02/02/2007, most recently 07/15/2014. MRI brain 12/05/2013, 05/27/2013.  FINDINGS: Large remote left middle cerebral artery distribution stroke with developing encephalomalacia in the left frontal, temporal and parietal lobes, unchanged. Ex vacuo enlargement of the left lateral ventricle, unchanged. Ventricles otherwise normal in appearance. Moderate changes of small vessel disease of the white matter, unchanged. Remote stroke involving the left anterior pons, unchanged. No mass lesion. No midline shift. No acute hemorrhage or hematoma. No extra-axial fluid collections. No evidence of acute infarction.  No skull fracture or other focal osseous abnormality involving the skull. Visualized paranasal sinuses, bilateral mastoid air cells and bilateral middle ear cavities well-aerated. Moderate bilateral carotid siphon atherosclerosis.  IMPRESSION: 1. No acute intracranial abnormality. 2. Stable remote large left middle cerebral artery distribution stroke and remote stroke involving the left anterior pons. 3. Stable moderate chronic microvascular ischemic changes of the white matter.   Electronically Signed   By: Evangeline Dakin M.D.   On: 11/08/2014 15:47   [name] Contrast  11/08/2014   CLINICAL DATA:  Dysphasia.  History of diabetes and stroke.  EXAM: MRI HEAD WITHOUT  CONTRAST  TECHNIQUE: Multiplanar, multiecho pulse sequences of the brain and surrounding structures were obtained without intravenous contrast.  COMPARISON:  CT head 11/08/2014.  MRI 12/05/2013  FINDINGS: Negative for acute infarct.  Chronic left MCA infarct involving the left frontal parietal lobe, insular cortex, and basal ganglia. Chronic infarct noted in the left thalamus. Atrophy and hyperintensity in the left midbrain. Chronic ischemia in the pons bilaterally. These findings are unchanged from 2015. Chronic microvascular ischemic changes also noted bilaterally in the cerebral hemispheres. Chronic hemorrhage is noted in the left internal capsule posteriorly , as noted previously.  Negative for hydrocephalus. Enlargement of the left lateral ventricle due to chronic ischemia on the left.  Negative for mass.  No fluid collection.  Mild mucosal edema paranasal sinuses.  Normal orbit.  IMPRESSION: Negative for acute infarct.  Chronic microvascular ischemic changes in the white matter. Chronic left MCA infarct.   Electronically Signed   By: Franchot Gallo M.D.   On: 11/08/2014 20:19   Dg Chest Port 1 View  11/08/2014   CLINICAL DATA:  Near syncope today, pt denies, pain, weakness, SOB. Constipation recentlyHx of stroke, right sided weakness/ asphasiaHx of DM, HTN  EXAM: PORTABLE CHEST - 1 VIEW  COMPARISON:  07/15/2014  FINDINGS: Cardiac  silhouette is normal in size and configuration. No mediastinal or hilar masses or evidence of adenopathy. Lungs are clear. No pleural effusion or pneumothorax.  Skeletal structures are unremarkable but grossly intact.  IMPRESSION: No acute cardiopulmonary disease.   Electronically Signed   By: Lajean Manes M.D.   On: 11/08/2014 14:48   Dg Abd Portable 1v  11/08/2014   CLINICAL DATA:  Constipation.  Near syncope.  EXAM: PORTABLE ABDOMEN - 1 VIEW  COMPARISON:  None.  FINDINGS: No evidence of dilated small bowel loops. Large amount of stool is seen throughout the course the colon.  Pelvic phleboliths noted.  IMPRESSION: No acute findings.  Large stool burden noted.   Electronically Signed   By: Earle Gell M.D.   On: 11/08/2014 14:45    Scheduled Meds: . sodium chloride   Intravenous STAT  . amLODipine  10 mg Oral Daily  . aspirin EC  325 mg Oral Daily  . baclofen  10 mg Oral BID  . enoxaparin (LOVENOX) injection  30 mg Subcutaneous Q24H  . insulin aspart  0-15 Units Subcutaneous 6 times per day  . metoprolol  100 mg Oral BID  . simvastatin  20 mg Oral q1800  . sodium chloride  3 mL Intravenous Q12H   Continuous Infusions: . sodium chloride 125 mL/hr at 11/09/14 1791       Time spent: 35 minutes    [facility]  [facility] Pager [phone] If 7PM-7AM, please contact night-coverage at [email or web address], password [facility] 11/09/2014, 1:26 PM  LOS: 1 day

## 2014-11-09 NOTE — Progress Notes (Signed)
Notified Dr Hartford Poli of 4+ loose stools, received telephone order for c.diff stool specimen as well as to in and out cath to obtain urine specimen.

## 2014-11-09 NOTE — Evaluation (Signed)
Physical Therapy Evaluation Patient Details Name: [name] MRN: [id number] DOB: May 16, 1960 Today's Date: 11/09/2014   History of Present Illness  54 y.o. 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 MRI negative for acute infarct.  Clinical Impression  Pt admitted with above diagnosis. Pt currently with functional limitations due to the deficits listed below (see PT Problem List). Concerned for pt's limited assist at home and recommending SW consult to assess options and for potential d/c to SNF if additional assist at home no available.  Pt's daughter currently pt's primary caregiver w/ CNA 61:30am-3:30pm M-F. Pt's daughter to bring hemiwalker tomorrow morning so pt may attempt ambulatory activity w/ PT at next session. Pt will benefit from skilled PT to increase their independence and safety with mobility to allow discharge to the venue listed below.     Follow Up Recommendations SNF;Supervision/Assistance - 24 hour    Equipment Recommendations  None recommended by PT    Recommendations for Other Services       Precautions / Restrictions Precautions Precautions: Fall Restrictions Weight Bearing Restrictions: No      Mobility  Bed Mobility Overal bed mobility: Needs Assistance Bed Mobility: Supine to Sit     Supine to sit: Mod assist Sit to supine: Min assist   General bed mobility comments: Cues for proper sequencing and use of bed rail w/ HOB slightly elevated. Use of bed pad to assist pt w/ scooting to EOB.  Transfers Overall transfer level: Needs assistance Equipment used: 2 person hand held assist Transfers: Sit to/from [employer] Sit to Stand: +2 safety/equipment;+2  physical assistance;Min assist Stand pivot transfers: Min assist;+2 physical assistance;+2 safety/equipment       General transfer comment: Min +2 assist to stand and hand over hand to release Lt hand from grip on bed rail prior to performing stand pivot transfer.  Increased time for stand pivot and cues for sequencing of Bil LEs.  Ambulation/Gait             General Gait Details: deferred until next session as pt does not have hemiwalker w/ her; pt's daughter to drop it by tomorrow morning so it will be available for use during next PT session.  Stairs            Wheelchair Mobility    Modified Rankin (Stroke Patients Only) Modified Rankin (Stroke Patients Only) Pre-Morbid Rankin Score: Severe disability (unable to self propel WC Ind) Modified Rankin: Severe disability     Balance Overall balance assessment: Needs assistance Sitting-balance support: Single extremity supported;Feet supported Sitting balance-Leahy Scale: Good     Standing balance support: Bilateral upper extremity supported;During functional activity Standing balance-Leahy Scale: Poor Standing balance comment: Relies on support of 2 to maintain upright                             Pertinent Vitals/Pain Pain Assessment: Faces Faces Pain Scale: Hurts even more Pain Location: Rt arms w/ PT stretching into Rt elbow extension Pain Descriptors / Indicators: Grimacing Pain Intervention(s): Limited activity within patient's tolerance;Monitored during session;Repositioned    Home Living Family/patient expects to be discharged to:: Private residence Living Arrangements: Children (daughter (junior in high school)) Available Help at Discharge: Family;Available PRN/intermittently;Personal  care attendant Type of Home: House Home Access: Stairs to enter Entrance Stairs-Rails: Psychiatric nurse of Steps: 2 Home Layout: One level Home Equipment: Wheelchair - manual;Tub bench;Bedside  commode;Cane - quad (hemiwalker) Additional Comments: Pt lives alone w/ daughter who is a Paramedic in high school who is her primary caregiver.  Pt has personal care attendant who comes 11:30am-3:30pm M-F.    Prior Function Level of Independence: Needs assistance   Gait / Transfers Assistance Needed: Daughter uses WC to bump pt up two step to enter home, per daughter pt walks down steps w/ use of hemiwalker and then sits in [facility] at bottom of steps.    ADL's / Homemaking Assistance Needed: Dependent in grooming, all bathing, dressing, and toileting.  Comments: Information from daughter as pt responds to yes and no questions only     Hand Dominance   Dominant Hand: Right    Extremity/Trunk Assessment   Upper Extremity Assessment: Defer to OT evaluation RUE Deficits / Details: hand in fisted position; OT able to stretch fingers some, but pt appeared in pain. Residual weakness in RUE from previous CVA.         Lower Extremity Assessment: Generalized weakness;RLE deficits/detail RLE Deficits / Details: Grossly 3/5 strength, able to move Rt LE during stand pivot       Communication   Communication: Receptive difficulties;Expressive difficulties  Cognition Arousal/Alertness: Awake/alert Behavior During Therapy: WFL for tasks assessed/performed Overall Cognitive Status: Difficult to assess                      General Comments General comments (skin integrity, edema, etc.): Anticipate that pt will progress well w/ ambulatory activity w/ introduction of hemiwalker at next session    Exercises        Assessment/Plan    PT Assessment Patient needs continued PT services  PT Diagnosis Abnormality of gait;Difficulty walking;Generalized weakness;Acute pain;Hemiplegia dominant side   PT Problem List Decreased strength;Decreased range of motion;Decreased activity tolerance;Decreased balance;Decreased mobility;Decreased coordination;Decreased cognition;Decreased knowledge of use  of DME;Decreased safety awareness;Decreased knowledge of precautions;Impaired sensation;Pain  PT Treatment Interventions DME instruction;Gait training;Stair training;Functional mobility training;Therapeutic activities;Therapeutic exercise;Balance training;Neuromuscular re-education;Cognitive remediation;Patient/family education;Wheelchair mobility training;Modalities   PT Goals (Current goals can be found in the Care Plan section) Acute Rehab PT Goals Patient Stated Goal: unable to state PT Goal Formulation: With patient/family Time For Goal Achievement: 11/23/14 Potential to Achieve Goals: Good    Frequency Min 4X/week   Barriers to discharge Inaccessible home environment;Decreased caregiver support does not have 24/7 assist and has 2 steps to enter home    Co-evaluation               End of Session Equipment Utilized During Treatment: Gait belt Activity Tolerance: Patient tolerated treatment well Patient left: in chair;with call bell/phone within reach;with family/visitor present Nurse Communication: Mobility status;Precautions (pivot to Lt if possible)         Time: 1311-1330 PT Time Calculation (min) (ACUTE ONLY): 19 min   Charges:   PT Evaluation $Initial PT Evaluation Tier I: 1 Procedure     PT G CodesJoslyn Hy PT, DPT [phone] Pager: [phone] 11/09/2014, 2:36 PM

## 2014-11-09 NOTE — Evaluation (Addendum)
Occupational Therapy Evaluation Patient Details Name: [name] MRN: [id number] DOB: [birth year] Today's Date: 11/09/2014    History of Present Illness 54 y.o. 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 MRI negative for acute infarct.   Clinical Impression   Pt admitted with above. Unsure of pt's PLOF, as no family was present in session. Recommending 24/7 assist at home, and if family can work this out to be provided at home, then pt may be able to d/c home. If not, recommending SNF. Feel pt will benefit from acute OT to increase independence and strength prior to d/c.    Follow Up Recommendations  SNF;Supervision/Assistance - 24 hour    Equipment Recommendations  None recommended by OT    Recommendations for Other Services       Precautions / Restrictions Precautions Precautions: Fall Restrictions Weight Bearing Restrictions: No      Mobility Bed Mobility Overal bed mobility: Needs Assistance Bed Mobility: Supine to Sit;Sit to Supine     Supine to sit: Mod assist Sit to supine: Min assist   General bed mobility comments: assist with LEs and to scoot to EOB. Assist with RLE when returning to bed.  Transfers Overall transfer level: Needs assistance Equipment used: None Transfers: Sit to/from Stand Sit to Stand: Min guard;Min assist         General transfer comment: assist initially to better position feet before standing.    Balance  Pt holding onto bed rail upon standing from bed.                                          ADL Overall ADL's : Needs assistance/impaired     Grooming: Wash/dry face;Applying deodorant;Sitting;Minimal assistance   Upper Body  Bathing: Minimal assitance;Sitting       Upper Body Dressing : Moderate assistance;Sitting   Lower Body Dressing: Moderate assistance;Bed level;Sitting/lateral leans;Maximal assistance Lower Body Dressing Details (indicate cue type and reason): able to doff both socks and partly don both of them Toilet Transfer: Min guard-Min assist (sit to stand from bed-did not let go of bed rail; OT helped initially with positioning of LEs, but stood again but feet very close together)   Toileting- Clothing Manipulation and Hygiene: Sit to/from stand;Maximal assistance         General ADL Comments: Pt sat EOB and was able to apply deodorant and washed her face with setup/supervision. Pt also able to wash her armpits with no physical assist.      Vision     Perception     Praxis      Pertinent Vitals/Pain Pain Assessment: Faces Faces Pain Scale: Hurts even more Pain Location: right hand with OT trying to stretch out fingers Pain Descriptors / Indicators: Grimacing Pain Intervention(s): Limited activity within patient's tolerance;Monitored during session (notified nurse)     Hand Dominance Right   Extremity/Trunk Assessment Upper Extremity Assessment Upper Extremity Assessment: RUE deficits/detail RUE Deficits / Details: hand in fisted position; OT able to stretch fingers some-increased tone, but pt appeared in pain. Residual weakness in RUE from previous CVA. RUE Coordination: decreased fine motor;decreased gross motor   Lower Extremity Assessment Lower Extremity Assessment:  Defer to PT evaluation       Communication Communication Communication: Receptive difficulties;Expressive difficulties   Cognition Arousal/Alertness: Awake/alert Behavior During Therapy: WFL for tasks assessed/performed Overall Cognitive Status: No family/caregiver present to determine baseline cognitive functioning (difficult to assess due to communication impairments)                     General  Comments       Exercises       Shoulder Instructions      Home Living Family/patient expects to be discharged to:: Private residence Living Arrangements: Children Available Help at Discharge: Family;Available PRN/intermittently;Personal care attendant Type of Home: House Home Access: Stairs to enter [employer] of Steps: 5-7 steps to enter son's home Entrance Stairs-Rails: Right;Left Home Layout: One level     Bathroom Shower/Tub: Tub/shower unit;Curtain   Biochemist, clinical: Standard     Home Equipment: Environmental consultant - 2 wheels;[place] - manual;Tub bench;Bedside commode   Additional Comments: information taken from previous admission      Prior Functioning/Environment Level of Independence: Needs assistance  Gait / Transfers Assistance Needed: per previous admission evaluation, ambulates short distances with assist and mostly unilateral assistive device ADL's / Homemaking Assistance Needed: per previous admission evaluation, Can self feed with set up and brush her teeth, otherwise dependent in grooming, all bathing, dressing, and toileting.   Comments: Information from previous admission-unsure of recent PLOF-no family present in session    OT Diagnosis: Generalized weakness   OT Problem List: Decreased strength;Pain;Decreased range of motion;Impaired balance (sitting and/or standing);Decreased knowledge of use of DME or AE;Decreased knowledge of precautions;Impaired tone;Impaired UE functional use;Decreased coordination   OT Treatment/Interventions: Self-care/ADL training;DME and/or AE instruction;Neuromuscular education;Therapeutic activities;Cognitive remediation/compensation;Patient/family education;Balance training;Therapeutic exercise    OT Goals(Current goals can be found in the care plan section) Acute Rehab OT Goals Patient Stated Goal: unable to state OT Goal Formulation: Patient unable to participate in goal setting Time For Goal Achievement:  11/16/14 Potential to Achieve Goals: Good ADL Goals Pt Will Perform Lower Body Bathing: with min assist;with caregiver independent in assisting;bed level;sitting/lateral leans Pt Will Perform Upper Body Dressing: with min assist;with caregiver independent in assisting;sitting Pt Will Perform Lower Body Dressing: with min assist;sitting/lateral leans;bed level;with caregiver independent in assisting Pt Will Transfer to Toilet: stand pivot transfer;bedside commode;with min assist (with caregiver independent in assisting) Pt Will Perform Toileting - Clothing Manipulation and hygiene: with min assist;bed level;sitting/lateral leans;with caregiver independent in assisting  OT Frequency: Min 2X/week   Barriers to D/C:            Co-evaluation              End of Session Equipment Utilized During Treatment: Gait belt Nurse Communication: Other (comment);Mobility status (pt had BM; pain/Rt hand)  Activity Tolerance: Patient tolerated treatment well Patient left: in bed;with call bell/phone within reach;with bed alarm set   Time: 6761-9509 OT Time Calculation (min): 16 min Charges:  OT General Charges $OT Visit: 1 Procedure OT Evaluation $Initial OT Evaluation Tier I: 1 Procedure G-CodesBenito Mccreedy OTR/L [phone] 11/09/2014, 11:27 AM

## 2014-11-09 NOTE — Clinical Social Work Note (Signed)
Advanced directive notarized for patient with patient's daughter and two witnesses at bedside.   Liz Beach MSW, [place], [place], [phone]

## 2014-11-09 NOTE — Clinical Social Work Placement (Signed)
   CLINICAL SOCIAL WORK PLACEMENT  NOTE  Date:  11/09/2014  Patient Details  Name: [name] MRN: [id number] Date of Birth: September 07, 1960  Clinical Social Work is seeking post-discharge placement for this patient at the [place] level of care (*CSW will initial, date and re-position this form in  chart as items are completed):  Yes   Patient/family provided with [place] Work Department's list of facilities offering this level of care within the geographic area requested by the patient (or if unable, by the patient's family).  Yes   Patient/family informed of their freedom to choose among providers that offer the needed level of care, that participate in Medicare, Medicaid or managed care program needed by the patient, have an available bed and are willing to accept the patient.  Yes   Patient/family informed of [facility]'s ownership interest in [facility] and [facility], as well as of the fact that they are under no obligation to receive care at these facilities.  PASRR submitted to EDS on 11/09/14     PASRR number received on       Existing PASRR number confirmed on       FL2 transmitted to all facilities in geographic area requested by pt/family on 11/09/14     FL2 transmitted to all facilities within larger geographic area on       Patient informed that his/her managed care company has contracts with or will negotiate with certain facilities, including the following:            Patient/family informed of bed offers received.  Patient chooses bed at       Physician recommends and patient chooses bed at      Patient to be transferred to   on  .  Patient to be transferred to facility by       Patient family notified on   of transfer.  Name of family member notified:        PHYSICIAN       Additional Comment:  MD please sign 30 day note and FL2 on chart for patient's  PASRR.  _______________________________________________ Liz Beach MSW, [place], [place], [phone]

## 2014-11-09 NOTE — Clinical Social Work Note (Signed)
Clinical Social Work Assessment  Patient Details  Name: [name] MRN: [id number] Date of Birth: Jan 14, 1961  Date of referral:  11/09/14               Reason for consult:  Facility Placement, Discharge Planning, [place] Directives                Permission sought to share information with:  [place] granted to share information::  Yes, Verbal Permission Granted  Name::     Theatre stage manager::  SNFs  Relationship::     Contact Information:     Housing/Transportation Living arrangements for the past 2 months:  Apartment Source of Information:  Adult Children Patient Interpreter Needed:  None Criminal Activity/Legal Involvement Pertinent to Current Situation/Hospitalization:  No - Comment as needed Significant Relationships:  Adult Children Lives with:  Adult Children Do you feel safe going back to the place where you live?  Yes Need for family participation in patient care:  Yes (Comment)  Care giving concerns:  Patient and patient's daughter requesting SNF placement for rehab.   Social Worker assessment / plan:  CSW met with patient, daughter, and patient's sister at bedside to complete assessment. The patient's family is requesting placement at SNF at discharge for short term rehab with eventual transition back home. Patient shakes her head "yes" in agreement with this. Patient has difficulty with speaking so she defers to her daughter to speak for her. Patient and daughter are interested in completed advanced directives her in the hospital. AD booklet provided and CSW will notarize this document once patient and daughter have completed the HPOA fields. CSW explained SNF search/placement process and answered the patient and family's questions. CSW explained that having Medicaid only will definitely limit our SNF options. Patient and family are hopefull for bed in [facility] but understand that this might not be  possible, they are hesitant to send the patient out of county, but plan to keep an open mind. CSW will follow up with bed offers.  Employment status:  Disabled (Comment on whether or not currently receiving Disability) Insurance information:  Medicaid In [place] PT Recommendations:  [place] / Referral to community resources:  [place]  Patient/Family's Response to care:  Patient and family appear to be happy with the care the patient has received here at [facility].  Patient/Family's Understanding of and Emotional Response to Diagnosis, Current Treatment, and Prognosis:  The patient and daughter appear to have a good understanding of the reason for admission and what the patient's post DC needs will be.   Emotional Assessment Appearance:  Appears stated age Attitude/Demeanor/Rapport:  Other (Patient is pleasant but unable to communicate verbally) Affect (typically observed):  Accepting, Calm, Pleasant Orientation:  Oriented to Self, Oriented to Place, Oriented to  Time, Oriented to Situation Alcohol / Substance use:  Never Used Psych involvement (Current and /or in the community):  No (Comment)  Discharge Needs  Concerns to be addressed:  Discharge Planning Concerns Readmission within the last 30 days:  No Current discharge risk:  Chronically ill, Physical Impairment Barriers to Discharge:  Continued Medical Work up   [employer] MSW, [place], [place], [phone]

## 2014-11-09 NOTE — Care Management Note (Signed)
Case Management Note  Patient Details  Name: [name] MRN: [id number] Date of Birth: 11-14-1960  Subjective/Objective:     Patient lives with daughter, per pt eval rec snf, CSW referral.               Action/Plan:   Expected Discharge Date:                  Expected Discharge Plan:  Skilled Nursing Facility  In-House Referral:  Clinical Social Work  Discharge planning Services  CM Consult  Post Acute Care Choice:    Choice offered to:     DME Arranged:    DME Agency:     HH Arranged:    [place] Agency:     Status of Service:  In process, will continue to follow  Medicare Important Message Given:    Date Medicare IM Given:    Medicare IM give by:    Date Additional Medicare IM Given:    Additional Medicare Important Message give by:     If discussed at [place] of Stay Meetings, dates discussed:    Additional Comments:  Zenon Mayo, RN 11/09/2014, 4:41 PM

## 2014-11-09 NOTE — Evaluation (Signed)
Clinical/Bedside Swallow Evaluation Patient Details  Name: MESCAL FLINCHBAUGH MRN: [id number] Date of Birth: 1960/04/20  Today's Date: 11/09/2014 Time: SLP Start Time (ACUTE ONLY): 40 SLP Stop Time (ACUTE ONLY): 1631 SLP Time Calculation (min) (ACUTE ONLY): 12 min  Past Medical History:  Past Medical History  Diagnosis Date  . Hypertension   . Depression   . Diabetes mellitus     ?type (05/29/2013)  . Bipolar affective disorder   . Schizophrenia     [name] 11/28/2007 (05/29/2013)  . Stroke 05/27/2013   Past Surgical History:  Past Surgical History  Procedure Laterality Date  . Cesarean section  ~ 1987  . Tubal ligation    . Tee without cardioversion N/A 12/08/2013    Procedure: TRANSESOPHAGEAL ECHOCARDIOGRAM (TEE);  Surgeon: Candee Furbish, MD;  Location: [facility] ENDOSCOPY;  Service: Cardiovascular;  Laterality: N/A;   HPI:  54 y.o. 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 MRI negative for acute infarct.Pt has previously required PEG s/p CVA, but has since had this removed. Most recent MBS 07/2014 recommends regular textures and thin liquids.   Assessment / Plan / Recommendation Clinical Impression  Pt's oropharyngeal swallow appears WFL, without overt signs of aspiration noted. Daughter's description of difficulties at home sounds consistent with oral holding. SLP provided education about the often cognitive nature of oral holding, and provided strategies for maximizing safe intake. Will start a regular diet and thin liquids under full supervision for now - will f/u briefly given concerns for swallowing trouble that precipitated admission.    Aspiration Risk  Mild    Diet Recommendation Age appropriate regular  solids;Thin   Medication Administration: Crushed with puree Compensations: Minimize environmental distractions;Slow rate;Small sips/bites;Other (Comment) (watch for oral holding)    Other  Recommendations Oral Care Recommendations: Oral care BID   Follow Up Recommendations   tba    Frequency and Duration min 1 x/week  1 week   Pertinent Vitals/Pain n/a    SLP Swallow Goals     Swallow Study Prior Functional Status  Type of Home: House Available Help at Discharge: Family;Available PRN/intermittently;Personal care attendant    General Other Pertinent Information: 54 y.o. 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 MRI negative for acute infarct.Pt has previously required PEG s/p CVA, but has since had this removed. Most recent MBS 07/2014 recommends regular textures and thin liquids. Type of Study: Bedside swallow evaluation Previous Swallow Assessment: see HPI Diet Prior to this Study: NPO Temperature Spikes Noted: No Respiratory Status: Room air History of Recent Intubation: No Behavior/Cognition: Alert;Cooperative;Other (Comment) (expressive difficulties) Oral Cavity - Dentition: Adequate natural dentition/normal for age Self-Feeding Abilities: Able to feed self Patient Positioning: Upright in chair/Tumbleform Baseline Vocal Quality: Normal    Oral/Motor/Sensory Function Overall Oral Motor/Sensory Function: Impaired at baseline   Ice Chips Ice chips: Not tested   Thin Liquid Thin Liquid: Within functional limits Presentation: Cup;Self Fed;Straw    Nectar Thick Nectar Thick Liquid: Not tested   Honey Thick Honey Thick Liquid: Not tested   Puree Puree: Within functional limits  Presentation: Self  Fed;Spoon   Solid      Solid: Within functional limits Presentation: Self Fed      Germain Osgood, M.A. CCC-SLP [phone]  Germain Osgood 11/09/2014,4:43 PM

## 2014-11-10 LAB — CBC
HEMATOCRIT: 36 % (ref 36.0–46.0)
HEMOGLOBIN: 12.2 g/dL (ref 12.0–15.0)
MCH: 29.9 pg (ref 26.0–34.0)
MCHC: 33.9 g/dL (ref 30.0–36.0)
MCV: 88.2 fL (ref 78.0–100.0)
Platelets: 191 10*3/uL (ref 150–400)
RBC: 4.08 MIL/uL (ref 3.87–5.11)
RDW: 12.1 % (ref 11.5–15.5)
WBC: 6.9 10*3/uL (ref 4.0–10.5)

## 2014-11-10 LAB — COMPREHENSIVE METABOLIC PANEL
ALBUMIN: 3.2 g/dL — AB (ref 3.5–5.0)
ALT: 17 U/L (ref 14–54)
ANION GAP: 5 (ref 5–15)
AST: 19 U/L (ref 15–41)
Alkaline Phosphatase: 59 U/L (ref 38–126)
BUN: 7 mg/dL (ref 6–20)
CHLORIDE: 108 mmol/L (ref 101–111)
CO2: 26 mmol/L (ref 22–32)
Calcium: 8.6 mg/dL — ABNORMAL LOW (ref 8.9–10.3)
Creatinine, Ser: 0.68 mg/dL (ref 0.44–1.00)
GFR calc non Af Amer: >60 mL/min (ref >60)
Glucose, Bld: 93 mg/dL (ref 65–99)
Potassium: 3.5 mmol/L (ref 3.5–5.1)
SODIUM: 139 mmol/L (ref 135–145)
Total Bilirubin: 0.7 mg/dL (ref 0.3–1.2)
Total Protein: 6.9 g/dL (ref 6.5–8.1)

## 2014-11-10 LAB — URINALYSIS, ROUTINE W REFLEX MICROSCOPIC
BILIRUBIN URINE: NEGATIVE
Glucose, UA: NEGATIVE mg/dL
Hgb urine dipstick: NEGATIVE
KETONES UR: 15 mg/dL — AB
LEUKOCYTES UA: NEGATIVE
NITRITE: NEGATIVE
Protein, ur: NEGATIVE mg/dL
SPECIFIC GRAVITY, URINE: 1.021 (ref 1.005–1.030)
UROBILINOGEN UA: 1 mg/dL (ref 0.0–1.0)
pH: 5 (ref 5.0–8.0)

## 2014-11-10 LAB — GLUCOSE, CAPILLARY
GLUCOSE-CAPILLARY: 79 mg/dL (ref 65–99)
GLUCOSE-CAPILLARY: 84 mg/dL (ref 65–99)
GLUCOSE-CAPILLARY: 115 mg/dL — AB (ref 65–99)
GLUCOSE-CAPILLARY: 145 mg/dL — AB (ref 65–99)
Glucose-Capillary: 120 mg/dL — ABNORMAL HIGH (ref 65–99)
Glucose-Capillary: 78 mg/dL (ref 65–99)

## 2014-11-10 MED ORDER — SORBITOL 70 % SOLN
960.0000 mL | TOPICAL_OIL | Freq: Once | ORAL | Status: AC
  Administered 2014-11-10: 960 mL via RECTAL
  Filled 2014-11-10: qty 240

## 2014-11-10 MED ORDER — ENSURE ENLIVE PO LIQD
237.0000 mL | Freq: Two times a day (BID) | ORAL | Status: DC
  Administered 2014-11-10 – 2014-11-11 (×2): 237 mL via ORAL

## 2014-11-10 MED ORDER — POTASSIUM CHLORIDE CRYS ER 20 MEQ PO TBCR
60.0000 meq | EXTENDED_RELEASE_TABLET | Freq: Once | ORAL | Status: AC
  Administered 2014-11-10: 60 meq via ORAL
  Filled 2014-11-10: qty 3

## 2014-11-10 MED ORDER — POLYETHYLENE GLYCOL 3350 17 G PO PACK
17.0000 g | PACK | Freq: Every day | ORAL | Status: DC
  Administered 2014-11-10 – 2014-11-11 (×2): 17 g via ORAL
  Filled 2014-11-10 (×2): qty 1

## 2014-11-10 NOTE — Progress Notes (Signed)
Initial Nutrition Assessment  DOCUMENTATION CODES:   Non-severe (moderate) malnutrition in context of chronic illness  INTERVENTION:   Ensure Enlive po BID, each supplement provides 350 kcal and 20 grams of protein  NUTRITION DIAGNOSIS:   Malnutrition related to chronic illness as evidenced by severe depletion of muscle mass, moderate depletion of body fat.  GOAL:   Patient will meet greater than or equal to 90% of their needs  MONITOR:   PO intake, Supplement acceptance, Labs, Weight trends, Skin, I & O's  REASON FOR ASSESSMENT:   Low [place]    ASSESSMENT:   [name] is a 54 y.o. 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 Family members reporting that patient required a PEG tube after CVA however this was later removed. Over the past week her daughter has noted increasing difficulties in swallowing food which has been progressive over the past week. She reports noticing patient choking on her food and having increased drooling along with holding food within her mouth in the last several days. Throughout this time patient has become increasingly weak, feeling faint and reportedly had 3 syncopal spells today. Her daughter does not believe she is taking any by mouth down. She denies worsening facial droop or right-sided hemiparesis, nor does she report other new focal neurological deficits. She was worked up with a CT scan of brain without contrast in the emergency department that showed a stable remote large left middle cerebral artery distribution stroke and remote stroke involving the left anterior pons. Chest x-ray did not show acute infiltrate. Patient denies fevers, chills, cough, shortness of  breath, chest pain, abdominal pain.  Pt admitted with new onset dysphagia. Pt has hx of CVA with PEG, however, PEG has been removed.   Spoke with pt, who was sitting in recliner at time of visit. She responded appropriately to close ended questions. She reports she is swallowing well and her appetite is improving. However, observed breakfast tray and pt consumed only 50% of her orange juice. Per H&P, pt with declining oral intake over the past week.   She also endorses wt loss, but is unable to quantify amount of time frame. Per wt hx, UBW around 136#. Noted a 20% wt loss over the past year.   Nutrition-Focused physical exam completed. Findings are mild to moderate fat depletion, moderate to severe muscle depletion, and no edema.   Discussed importance of good PO intake to promote healing. Pt reports she consumes Ensure a few times per week. She amenable to drink to optimize nutritional status.   Labs reviewed.   Diet Order:  Diet regular Room service appropriate?: Yes; Fluid consistency:: Thin  Skin:  Reviewed, no issues  Last BM:  11/09/14  Height:   Ht Readings from Last 1 Encounters:  11/08/14 5\' 5"  (1.651 m)    Weight:   Wt Readings from Last 1 Encounters:  11/08/14 110 lb 3.7 oz (50 kg)    Ideal Body Weight:  56.8 kg  BMI:  Body mass index is 18.34 kg/(m^2).  Estimated Nutritional Needs:   Kcal:  1500-1700  Protein:  65-75 grams  Fluid:  1.5-1.7 L  EDUCATION NEEDS:   Education needs addressed  Noreen Mackintosh A. Jimmye Norman, RD, LDN, CDE Pager: [phone] After hours Pager: [phone]

## 2014-11-10 NOTE — Progress Notes (Signed)
Physical Therapy Treatment Patient Details Name: [name] MRN: [id number] DOB: Oct 27, 1960 Today's Date: 11/10/2014    History of Present Illness 54 y.o. 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 MRI negative for acute infarct.    PT Comments    Patient is progressing well overall. She was able to walk today with HHAx2. Hemiwalker had not been bought in by daughter yet. Patient nodded when asked if she was tired today. Continue to recommend SNF for ongoing Physical Therapy.     Follow Up Recommendations  SNF;Supervision/Assistance - 24 hour     Equipment Recommendations  None recommended by PT    Recommendations for Other Services       Precautions / Restrictions Precautions Precautions: Fall    Mobility  Bed Mobility Overal bed mobility: Needs Assistance       Supine to sit: Mod assist     General bed mobility comments: Cues for proper sequencing and use of bed rail w/ HOB slightly elevated. Use of bed pad to assist pt w/ scooting to EOB.  Transfers Overall transfer level: Needs assistance Equipment used: 2 person hand held assist   Sit to Stand: Min assist         General transfer comment: A to ensure full upright stand and to maintain balance. Cues for positioning of LEs prior to standing. A to control sitting.   Ambulation/Gait Ambulation/Gait assistance: +2 physical assistance;Mod assist Ambulation Distance (Feet): 10 Feet Assistive device: 2 person hand held assist Gait Pattern/deviations: Step-to pattern;Decreased dorsiflexion - right;Shuffle     General Gait Details: A with weight shifting. Heavy L side lean with gait. Daughter had yet to bring hemiwalker for use  this session   Stairs            Wheelchair Mobility    Modified Rankin (Stroke Patients Only) Modified Rankin (Stroke Patients Only) Pre-Morbid Rankin Score: Severe disability Modified Rankin: Moderately severe disability     Balance     Sitting balance-Leahy Scale: Good       Standing balance-Leahy Scale: Poor                      Cognition Arousal/Alertness: Awake/alert Behavior During Therapy: WFL for tasks assessed/performed Overall Cognitive Status: Difficult to assess                      Exercises      General Comments        Pertinent Vitals/Pain Faces Pain Scale: Hurts even more Pain Location: Rt arm with movement Pain Descriptors / Indicators: Grimacing Pain Intervention(s): Monitored during session;Repositioned    Home Living                      Prior Function            PT Goals (current goals can now be found in the care plan section) Progress towards PT goals: Progressing toward goals    Frequency  Min 4X/week    PT Plan Current plan remains appropriate    Co-evaluation             End of Session   Activity Tolerance: Patient tolerated treatment well Patient left: in chair;with call bell/phone within reach;with  family/visitor present     Time: 9381-8299 PT Time Calculation (min) (ACUTE ONLY): 23 min  Charges:  $Gait Training: 8-22 mins $Therapeutic Activity: 8-22 mins                    G Codes:      Jacqualyn Posey 11/10/2014, 1:11 PM 11/10/2014 Jacqualyn Posey PTA [phone] pager [phone] office

## 2014-11-10 NOTE — Progress Notes (Signed)
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  Her daughter reports having issues taking by mouth well with a course of the week and seems progressive.  Consequently she has become dehydrated.  Will make her nothing by mouth and provide IV fluid resuscitation.  CT and MRI were negative for acute events. SLP evaluated the patient recommended regular diet. Discharge in a.m. To SNF  Syncope She had 3 brief syncopal events today which likely has resulted from dehydration.  Doubt its of cardiac origin, 3 sets of cardiac enzymes were negative and telemetry negative for arrhythmias She was bolused with 1 L of normal saline in the emergency department and will continue maintenance fluids at 125 mL/hour.  CT scan of brain was stable. Stop IV fluids, patient is started on diet.  History of left middle cerebral artery distribution CVA As mentioned above initial CT scan of brain stable.  She presents with a one-week history of dysphasia becoming progressively worse.  As mentioned above negative CT and MRI of the brain. Continue aspirin and Zocor.  Dehydration Likely secondary to minimal by mouth intake from dysphasia. Providing IV fluid resuscitation with normal saline.  Dyslipidemia Continue statin therapy  Hypokalemia Will replace with potassium chloride  Constipation Family reporting patient not having a bowel movement in 1  week. X-ray done on the 21st showed large stool burden, yesterday patient had diarrhea, likely overflow diarrhea. We'll give large smog enema prior to discharge.  Code Status: Full Code Family Communication: Plan discussed with the patient. Disposition Plan: Remains inpatient Diet: Diet regular Room service appropriate?: Yes; Fluid consistency:: ThinNpo. Lovenox for DVT prophylaxis  Consultants:  none  Procedures:  none  Antibiotics:  none   Objective: Filed Vitals:   11/10/14 1406  BP: 133/71  Pulse: 80  Temp: 99.1 F (37.3 C)  Resp: 20    Intake/Output Summary (Last 24 hours) at 11/10/14 1446 Last data filed at 11/10/14 1029  Gross per 24 hour  Intake   1890 ml  Output      0 ml  Net   1890 ml   Filed Weights   11/08/14 1742  Weight: 50 kg (110 lb 3.7 oz)    Exam: General: Alert and awake, oriented x3, not in any acute distress. HEENT: anicteric sclera, pupils reactive to light and accommodation, EOMI CVS: S1-S2 clear, no murmur rubs or gallops Chest: clear to auscultation bilaterally, no wheezing, rales or rhonchi Abdomen: soft nontender, nondistended, normal bowel sounds, no organomegaly Extremities: no cyanosis, clubbing or edema noted bilaterally Neuro: Cranial nerves II-XII intact, no focal neurological deficits  Data Reviewed: Basic Metabolic Panel:  Recent Labs Lab 11/08/14 1355 11/09/14 0536 11/10/14 0724  NA 139 143 139  K 3.4* 3.6 3.5  CL 99* 108 108  CO2 32 27 26  GLUCOSE 144* 108* 93  BUN 13 10 7   CREATININE 0.81 0.72 0.68  CALCIUM 9.5 9.2 8.6*   Liver Function Tests:  Recent Labs Lab 11/08/14 1355 11/09/14 0536 11/10/14 0724  AST 30 22 19   ALT 28 23 17   ALKPHOS 65 68 59  BILITOT 0.5 0.8 0.7  PROT 7.3 6.7 6.9  ALBUMIN 3.8 3.5 3.2*    Recent Labs Lab 11/08/14 1355  LIPASE 24   No results for input(s): AMMONIA in the last 168 hours. CBC:  Recent Labs Lab 11/08/14 1355 11/10/14 0724  WBC 5.5 6.9  NEUTROABS  2.2  --   HGB 14.1 12.2  HCT 40.9 36.0  MCV 88.3 88.2  PLT 219 191   Cardiac Enzymes:  Recent Labs Lab 11/08/14 2010 11/09/14 0007 11/09/14 0536  TROPONINI <0.03 <0.03 <0.03   BNP (last 3 results) No results for input(s): BNP in the last 8760 hours.  ProBNP (last 3 results) No results for input(s): PROBNP in the last 8760 hours.  CBG:  Recent Labs Lab 11/09/14 2059 11/10/14 0013 11/10/14 0402 11/10/14 0749 11/10/14 1135  GLUCAP 199* 120* 79 84 78    Micro No results found for this or any previous visit (from the past 240 hour(s)).   Studies: Ct Head Compton Contrast  11/08/2014   CLINICAL DATA:  54 year old who describes multiple near syncopal episodes today. Personal history of left MCA territory stroke with residual right-sided deficits and 8 aphasia. New onset of dysphagia which began approximately 1 week ago.  EXAM: CT HEAD WITHOUT CONTRAST  TECHNIQUE: Contiguous axial images were obtained from the base of the skull through the vertex without intravenous contrast.  COMPARISON:  Multiple prior head CTs dating back to 02/02/2007, most recently 07/15/2014. MRI brain 12/05/2013, 05/27/2013.  FINDINGS: Large remote left middle cerebral artery distribution stroke with developing encephalomalacia in the left frontal, temporal and parietal lobes, unchanged. Ex vacuo enlargement of the left lateral ventricle, unchanged. Ventricles otherwise normal in appearance. Moderate changes of small vessel disease of the white matter, unchanged. Remote stroke involving the left anterior pons, unchanged. No mass lesion. No midline shift. No acute hemorrhage or hematoma. No extra-axial fluid collections. No evidence of acute infarction.  No skull fracture or other focal osseous abnormality involving the skull. Visualized paranasal sinuses, bilateral mastoid air cells and bilateral middle ear cavities well-aerated. Moderate bilateral carotid siphon atherosclerosis.  IMPRESSION: 1. No acute intracranial  abnormality. 2. Stable remote large left middle cerebral artery distribution stroke and remote stroke involving the left anterior pons. 3. Stable moderate chronic microvascular ischemic changes of the white matter.   Electronically Signed   By: Evangeline Dakin M.D.   On: 11/08/2014 15:47   [name] Contrast  11/08/2014   CLINICAL DATA:  Dysphasia.  History of diabetes and stroke.  EXAM: MRI HEAD WITHOUT CONTRAST  TECHNIQUE: Multiplanar, multiecho pulse sequences of the brain and surrounding structures were obtained without intravenous contrast.  COMPARISON:  CT head 11/08/2014.  MRI 12/05/2013  FINDINGS: Negative for acute infarct.  Chronic left MCA infarct involving the left frontal parietal lobe, insular cortex, and basal ganglia. Chronic infarct noted in the left thalamus. Atrophy and hyperintensity in the left midbrain. Chronic ischemia in the pons bilaterally. These findings are unchanged from 2015. Chronic microvascular ischemic changes also noted bilaterally in the cerebral hemispheres. Chronic hemorrhage is noted in the left internal capsule posteriorly , as noted previously.  Negative for hydrocephalus. Enlargement of the left lateral ventricle due to chronic ischemia on the left.  Negative for mass.  No fluid collection.  Mild mucosal edema paranasal sinuses.  Normal orbit.  IMPRESSION: Negative for acute  infarct.  Chronic microvascular ischemic changes in the white matter. Chronic left MCA infarct.   Electronically Signed   By: Franchot Gallo M.D.   On: 11/08/2014 20:19    Scheduled Meds: . amLODipine  10 mg Oral Daily  . aspirin EC  325 mg Oral Daily  . baclofen  10 mg Oral BID  . enoxaparin (LOVENOX) injection  30 mg Subcutaneous Q24H  . insulin aspart  0-15 Units Subcutaneous 6 times per day  . metoprolol  100 mg Oral BID  . simvastatin  20 mg Oral q1800  . sodium chloride  3 mL Intravenous Q12H   Continuous Infusions: . sodium chloride 1,000 mL (11/10/14 1312)       Time  spent: 35 minutes    [facility]  [facility] Pager [phone] If 7PM-7AM, please contact night-coverage at [email or web address], password [facility] 11/10/2014, 2:46 PM  LOS: 2 days

## 2014-11-11 DIAGNOSIS — R55 Syncope and collapse: Secondary | ICD-10-CM

## 2014-11-11 DIAGNOSIS — R1310 Dysphagia, unspecified: Principal | ICD-10-CM

## 2014-11-11 DIAGNOSIS — E860 Dehydration: Secondary | ICD-10-CM

## 2014-11-11 DIAGNOSIS — E440 Moderate protein-calorie malnutrition: Secondary | ICD-10-CM | POA: Insufficient documentation

## 2014-11-11 LAB — GLUCOSE, CAPILLARY
Glucose-Capillary: 113 mg/dL — ABNORMAL HIGH (ref 65–99)
Glucose-Capillary: 59 mg/dL — ABNORMAL LOW (ref 65–99)
Glucose-Capillary: 68 mg/dL (ref 65–99)
Glucose-Capillary: 71 mg/dL (ref 65–99)
Glucose-Capillary: 76 mg/dL (ref 65–99)

## 2014-11-11 LAB — BASIC METABOLIC PANEL
Anion gap: 8 (ref 5–15)
BUN: 6 mg/dL (ref 6–20)
CALCIUM: 8.7 mg/dL — AB (ref 8.9–10.3)
CO2: 26 mmol/L (ref 22–32)
CREATININE: 0.59 mg/dL (ref 0.44–1.00)
Chloride: 108 mmol/L (ref 101–111)
GFR calc Af Amer: >60 mL/min (ref >60)
Glucose, Bld: 90 mg/dL (ref 65–99)
Potassium: 3.7 mmol/L (ref 3.5–5.1)
SODIUM: 142 mmol/L (ref 135–145)

## 2014-11-11 MED ORDER — METOPROLOL TARTRATE 100 MG PO TABS: 100.0000 mg | ORAL_TABLET | Freq: Two times a day (BID) | ORAL | Status: DC

## 2014-11-11 MED ORDER — ENSURE ENLIVE PO LIQD: 237.0000 mL | Freq: Two times a day (BID) | ORAL | Status: DC

## 2014-11-11 MED ORDER — SENNOSIDES 8.6 MG PO TABS: 1.0000 | ORAL_TABLET | Freq: Every day | ORAL | Status: DC

## 2014-11-11 MED ORDER — POTASSIUM CHLORIDE ER 20 MEQ PO TBCR: 20.0000 meq | EXTENDED_RELEASE_TABLET | Freq: Every day | ORAL | Status: DC

## 2014-11-11 NOTE — Clinical Social Work Placement (Signed)
   CLINICAL SOCIAL WORK PLACEMENT  NOTE  Date:  11/11/2014  Patient Details  Name: [name] MRN: [id number] Date of Birth: [birth year]  Clinical Social Work is seeking post-discharge placement for this patient at the [place] level of care (*CSW will initial, date and re-position this form in  chart as items are completed):  Yes   Patient/family provided with [place] Work Department's list of facilities offering this level of care within the geographic area requested by the patient (or if unable, by the patient's family).  Yes   Patient/family informed of their freedom to choose among providers that offer the needed level of care, that participate in Medicare, Medicaid or managed care program needed by the patient, have an available bed and are willing to accept the patient.  Yes   Patient/family informed of [place]'s ownership interest in [facility] and [facility], as well as of the fact that they are under no obligation to receive care at these facilities.  PASRR submitted to EDS on 11/09/14     PASRR number received on 11/11/14     Existing PASRR number confirmed on       FL2 transmitted to all facilities in geographic area requested by pt/family on 11/09/14     FL2 transmitted to all facilities within larger geographic area on       Patient informed that his/her managed care company has contracts with or will negotiate with certain facilities, including the following:        Yes   Patient/family informed of bed offers received.  Patient chooses bed at [facility], [facility]     Physician recommends and patient chooses bed at      Patient to be transferred to [facility], [facility] on 11/11/14.  Patient to be transferred to facility by Ambulance     Patient family notified on 11/11/14 of transfer.  Name of family member notified:  [name]     PHYSICIAN       Additional Comment:  Per MD patient  ready for DC to [facility]. RN, patient, patient's family, and facility notified of DC. RN given number for report. DC packet on chart. Ambulance transport requested for patient for 1:30PM. CSW signing off.   _______________________________________________ Liz Beach MSW, [place], [place], [phone]

## 2014-11-11 NOTE — Progress Notes (Signed)
Speech Language Pathology Treatment: Dysphagia  Patient Details Name: [name] MRN: [id number] DOB: 1960/09/22 Today's Date: 11/11/2014 Time: 9758-8325 SLP Time Calculation (min) (ACUTE ONLY): 17 min  Assessment / Plan / Recommendation Clinical Impression  Skilled treatment session focused on addressing dysphagia goals.  Upon SLP arrival daughter assisting patient with tray set-up.  SLP observed patient consume large bites of regular textures and thin liquids via straw with no overt s/s of aspiration.  Cues for smaller portions decreased time of oral phase and no oral holding was observed today.  Nurse tech reported that patient spit up/out (not sure) part of breakfast this am; however, this was not observed at lunch.  Daughter continues to express concerns with swallowing because her mom was spitting up food at home and choking too.  Daughter and patient have agreed to SNF following discharge.  Recommend brief follow up at next level of care to further assist at discerning if the problem is pharyngeal or GI.        HPI Other Pertinent Information: 54 y.o. 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 MRI negative for acute infarct.Pt has previously required PEG s/p CVA, but has since had this removed. Most recent MBS 07/2014 recommends regular textures and thin liquids.   Pertinent Vitals Pain Assessment: No/denies pain  SLP Plan  Continue with current plan of care    Recommendations Diet recommendations: Regular;Thin liquid Liquids provided via: Cup;Straw Medication Administration: Crushed with puree Supervision: Patient able to self feed;Full supervision/cueing for compensatory strategies;Trained caregiver to  feed patient Compensations: Minimize environmental distractions;Slow rate;Small sips/bites Postural Changes and/or Swallow Maneuvers: Seated upright 90 degrees;Upright 30-60 min after meal              Oral Care Recommendations: Oral care BID Follow up Recommendations: 24 hour supervision/assistance;Skilled Nursing facility Plan: Continue with current plan of care    GO    Carmelia Roller., CCC-SLP [phone]  [place] 11/11/2014, 1:13 PM

## 2014-11-11 NOTE — Care Management Note (Signed)
Case Management Note  Patient Details  Name: VON INSCOE MRN: [id number] Date of Birth: [birth year]  Subjective/Objective:    Patient is for dc to snf today, CSW following.                Action/Plan:   Expected Discharge Date:                  Expected Discharge Plan:  Skilled Nursing Facility  In-House Referral:  Clinical Social Work  Discharge planning Services  CM Consult  Post Acute Care Choice:    Choice offered to:     DME Arranged:    DME Agency:     HH Arranged:    [place] Agency:     Status of Service:  Completed, signed off  Medicare Important Message Given:    Date Medicare IM Given:    Medicare IM give by:    Date Additional Medicare IM Given:    Additional Medicare Important Message give by:     If discussed at [place] of Stay Meetings, dates discussed:    Additional Comments:  Zenon Mayo, RN 11/11/2014, 10:52 AM

## 2014-11-11 NOTE — Progress Notes (Addendum)
0023: CBG 59. Patient given orange juice and gram crackers. Will recheck after 15 min. Pt asymptomatic. MD paged. Anoop Hemmer, Rande Brunt, RN   0036: CBG 71. Encouraged to finish snack. Will continue to monitor closely.

## 2014-11-11 NOTE — Discharge Summary (Signed)
PATIENT DETAILS Name: [name] Age: 54 y.o. Sex: female Date of Birth: 1960-08-09 MRN: [id number]. Admitting Physician: Kelvin Cellar, MD [id number] [place], NP  Admit Date: 11/08/2014 Discharge date: 11/11/2014  Recommendations for Outpatient Follow-up:  1. Please obtain  SLP follow-up while at SNF 2. Currently off all medications for diabetes-please follow-maintain on lifestyle/ dietary modifications. Please repeat A1c in the next 2 months. 3. 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 Hypertension   . Depression   . Diabetes mellitus     ?type (05/29/2013)  . Bipolar affective disorder   . Schizophrenia     [name] 11/28/2007 (05/29/2013)  . Stroke 05/27/2013    DISCHARGE MEDICATIONS: Current Discharge Medication List    START taking these medications   Details  feeding supplement, ENSURE ENLIVE, (ENSURE ENLIVE) LIQD Take 237 mLs by mouth 2 (two) times daily between meals.    potassium chloride 20 MEQ TBCR Take 20 mEq by mouth daily.      CONTINUE these medications which have CHANGED   Details  metoprolol (LOPRESSOR) 100 MG tablet Take 1 tablet (100 mg total) by mouth 2 (two) times daily.    senna (SENNA LAX) 8.6 MG tablet Take 1 tablet (8.6 mg total) by mouth daily.      CONTINUE these medications which have NOT CHANGED   Details  acetaminophen (TYLENOL) 325 MG tablet Take 1-2 tablets (325-650 mg total) by mouth every 4 (four) hours as needed for mild pain (available over the counter).    amLODipine (NORVASC) 10 MG tablet Take 1 tablet (10 mg total) by mouth daily. For blood pressure Qty: 90  tablet, Refills: 3    aspirin 325 MG tablet Take 325 mg by mouth daily.    baclofen (LIORESAL) 10 MG tablet Take 1 tablet (10 mg total) by mouth 2 (two) times daily. To help with tone/spasms Qty: 60 tablet, Refills: 1   Associated Diagnoses: Spastic hemiplegia affecting dominant side    gabapentin (NEURONTIN) 100 MG capsule Take 1 capsule (100 mg total) by mouth 3 (three) times daily. Qty: 90 capsule, Refills: 1   Associated Diagnoses: DM (diabetes mellitus) type II controlled, neurological manifestation    hydrochlorothiazide (HYDRODIURIL) 25 MG tablet Take 1 tablet (25 mg total) by mouth daily. Qty: 30 tablet, Refills: 2    polyethylene glycol powder (GLYCOLAX/MIRALAX) powder Take 17 g by mouth daily. Qty: 527 g, Refills: 3    simvastatin (ZOCOR) 20 MG tablet TAKE 1 TABLET BY MOUTH DAILY AT 6 PM Qty: 30 tablet, Refills: 2    glycerin adult (GLYCERIN ADULT) 2 G SUPP Place 1 suppository rectally once as needed for moderate constipation. Qty: 10 suppository, Refills: 0      STOP taking these medications     Insulin Glargine (LANTUS SOLOSTAR) 100 UNIT/ML Solostar Pen      ACCU-CHEK AVIVA PLUS test strip      ACCU-CHEK SOFTCLIX LANCETS lancets      Insulin Pen Needle 31G X 6 MM MISC         ALLERGIES:  No Known Allergies  BRIEF HPI:  See H&P, Labs, Consult  and Test reports for all details in brief, patient is a 54 y.o. female with a past medical history of large left middle cerebral artery distribution infarct in March 2015, having residual right-sided facial droop, right upper extremity paralysis and right lower extremity weakness, brought to the emergency department by family members expressing concern for dysphagia.  CONSULTATIONS:   None  PERTINENT RADIOLOGIC STUDIES: Ct Head Wo Contrast  11/08/2014   CLINICAL DATA:  54 year old who describes multiple near syncopal episodes today. Personal history of left MCA territory stroke with residual right-sided deficits and 8  aphasia. New onset of dysphagia which began approximately 1 week ago.  EXAM: CT HEAD WITHOUT CONTRAST  TECHNIQUE: Contiguous axial images were obtained from the base of the skull through the vertex without intravenous contrast.  COMPARISON:  Multiple prior head CTs dating back to 02/02/2007, most recently 07/15/2014. MRI brain 12/05/2013, 05/27/2013.  FINDINGS: Large remote left middle cerebral artery distribution stroke with developing encephalomalacia in the left frontal, temporal and parietal lobes, unchanged. Ex vacuo enlargement of the left lateral ventricle, unchanged. Ventricles otherwise normal in appearance. Moderate changes of small vessel disease of the white matter, unchanged. Remote stroke involving the left anterior pons, unchanged. No mass lesion. No midline shift. No acute hemorrhage or hematoma. No extra-axial fluid collections. No evidence of acute infarction.  No skull fracture or other focal osseous abnormality involving the skull. Visualized paranasal sinuses, bilateral mastoid air cells and bilateral middle ear cavities well-aerated. Moderate bilateral carotid siphon atherosclerosis.  IMPRESSION: 1. No acute intracranial abnormality. 2. Stable remote large left middle cerebral artery distribution stroke and remote stroke involving the left anterior pons. 3. Stable moderate chronic microvascular ischemic changes of the white matter.   Electronically Signed   By: Evangeline Dakin M.D.   On: 11/08/2014 15:47   [name] Contrast  11/08/2014   CLINICAL DATA:  Dysphasia.  History of diabetes and stroke.  EXAM: MRI HEAD WITHOUT CONTRAST  TECHNIQUE: Multiplanar, multiecho pulse sequences of the brain and surrounding structures were obtained without intravenous contrast.  COMPARISON:  CT head 11/08/2014.  MRI 12/05/2013  FINDINGS: Negative for acute infarct.  Chronic left MCA infarct involving the left frontal parietal lobe, insular cortex, and basal ganglia. Chronic infarct noted in the left  thalamus. Atrophy and hyperintensity in the left midbrain. Chronic ischemia in the pons bilaterally. These findings are unchanged from 2015. Chronic microvascular ischemic changes also noted bilaterally in the cerebral hemispheres. Chronic hemorrhage is noted in the left internal capsule posteriorly , as noted previously.  Negative for hydrocephalus. Enlargement of the left lateral ventricle due to chronic ischemia on the left.  Negative for mass.  No fluid collection.  Mild mucosal edema paranasal sinuses.  Normal orbit.  IMPRESSION: Negative for acute infarct.  Chronic microvascular ischemic changes in the white matter. Chronic left MCA infarct.   Electronically Signed   By: Franchot Gallo M.D.   On: 11/08/2014 20:19   Dg Chest Port 1 View  11/08/2014   CLINICAL DATA:  Near syncope today, pt denies, pain, weakness, SOB. Constipation recentlyHx of stroke, right sided weakness/ asphasiaHx of DM, HTN  EXAM: PORTABLE CHEST - 1 VIEW  COMPARISON:  07/15/2014  FINDINGS: Cardiac silhouette is normal in size and configuration. No mediastinal or hilar masses or evidence of adenopathy. Lungs are clear. No pleural effusion or pneumothorax.  Skeletal structures are unremarkable but grossly intact.  IMPRESSION: No acute cardiopulmonary disease.   Electronically Signed   By: Dedra Skeens.D.  On: 11/08/2014 14:48   Dg Abd Portable 1v  11/08/2014   CLINICAL DATA:  Constipation.  Near syncope.  EXAM: PORTABLE ABDOMEN - 1 VIEW  COMPARISON:  None.  FINDINGS: No evidence of dilated small bowel loops. Large amount of stool is seen throughout the course the colon. Pelvic phleboliths noted.  IMPRESSION: No acute findings.  Large stool burden noted.   Electronically Signed   By: Earle Gell M.D.   On: 11/08/2014 14:45     PERTINENT LAB RESULTS: CBC:  Recent Labs  11/08/14 1355 11/10/14 0724  WBC 5.5 6.9  HGB 14.1 12.2  HCT 40.9 36.0  PLT 219 191   CMET CMP     Component Value Date/Time   NA 142 11/11/2014  0541   K 3.7 11/11/2014 0541   CL 108 11/11/2014 0541   CO2 26 11/11/2014 0541   GLUCOSE 90 11/11/2014 0541   BUN 6 11/11/2014 0541   CREATININE 0.59 11/11/2014 0541   CREATININE 0.70 11/06/2013 1751   CALCIUM 8.7* 11/11/2014 0541   PROT 6.9 11/10/2014 0724   ALBUMIN 3.2* 11/10/2014 0724   AST 19 11/10/2014 0724   ALT 17 11/10/2014 0724   ALKPHOS 59 11/10/2014 0724   BILITOT 0.7 11/10/2014 0724   GFRNONAA >60 11/11/2014 0541   GFRAA >60 11/11/2014 0541    GFR Estimated Creatinine Clearance: 65.7 mL/min (by C-G formula based on Cr of 0.59).  Recent Labs  11/08/14 1355  LIPASE 24    Recent Labs  11/08/14 2010 11/09/14 0007 11/09/14 0536  TROPONINI <0.03 <0.03 <0.03   Invalid input(s): POCBNP No results for input(s): DDIMER in the last 72 hours. No results for input(s): HGBA1C in the last 72 hours. No results for input(s): CHOL, HDL, LDLCALC, TRIG, CHOLHDL, LDLDIRECT in the last 72 hours. No results for input(s): TSH, T4TOTAL, T3FREE, THYROIDAB in the last 72 hours.  Invalid input(s): FREET3 No results for input(s): VITAMINB12, FOLATE, FERRITIN, TIBC, IRON, RETICCTPCT in the last 72 hours. Coags: No results for input(s): INR in the last 72 hours.  Invalid input(s): PT Microbiology: No results found for this or any previous visit (from the past 240 hour(s)).   BRIEF HOSPITAL COURSE:   Principal Problem: Dysphagia: Evaluated by speech therapy, felt safe for regular diet with thin liquids. This M.D. spoke with patient's daughter over the phone on 8/24, explained risks of aspiration given prior history of CVA and dysarthria. Family very understanding, excepting risks and wanted to continue with diet. Please obtain regular speech therapy follow-up while at SNF. Please note, MRI brain was negative for acute abnormalities.  Active Problems: Syncope: Likely secondary to dehydration, did have somewhat positive orthostatic vital signs. Telemetry negative. Low suspicion for  cardiac etiology. No further workup required at present. CT head/MRI brain negative for acute abnormalities.  History of CVA with chronic right-sided weakness and dysarthria: Continue aspirin and statin  DM (diabetes mellitus) type II controlled, neurological manifestation: Only on sliding scale insulin, with episode of hypoglycemia earlier today. Last A1c on 10/20/14 at 5.7. Suspect we can monitor off anti-diabetic medications and maintain on lifestyle/dietary regimen and recheck A1c in the next 2 months. If going up, please restart either oral hypoglycemic agents or insulin.   Dehydration: Secondary to poor oral intake, resolved by IV fluids.  Dyslipidemia: Continue statins  Constipation: Relieved by enema. Maintain on above noted bowel regimen medications.  Malnutrition of moderate degree: Continue supplements on discharge.    TODAY-DAY OF DISCHARGE:  Subjective:   Jiles Prows  today has no headache,no chest abdominal pain,no new weakness tingling or numbness  Objective:   Blood pressure 140/81, pulse 96, temperature 98.3 F (36.8 C), temperature source Oral, resp. rate 20, height 5\' 5"  (1.651 m), weight 50 kg (110 lb 3.7 oz), SpO2 100 %.  Intake/Output Summary (Last 24 hours) at 11/11/14 0956 Last data filed at 11/11/14 0900  Gross per 24 hour  Intake   1540 ml  Output      0 ml  Net   1540 ml   Filed Weights   11/08/14 1742  Weight: 50 kg (110 lb 3.7 oz)    Exam Awake Alert, Oriented *3, No new F.N deficits, Normal affect [place].AT,PERRAL Supple Neck,No JVD, No cervical lymphadenopathy appriciated.  Symmetrical Chest wall movement, Good air movement bilaterally, CTAB RRR,No Gallops,Rubs or new Murmurs, No Parasternal Heave +ve B.Sounds, Abd Soft, Non tender, No organomegaly appriciated, No rebound -guarding or rigidity. No Cyanosis, Clubbing or edema, No new Rash or bruise  DISCHARGE CONDITION: Stable  DISPOSITION: SNF   DISCHARGE INSTRUCTIONS:    Activity:  As  tolerated with Full fall precautions use walker/cane & assistance as needed  Diet recommendation: Diabetic Diet Heart Healthy diet  Discharge Instructions    Call MD for:  persistant nausea and vomiting    Complete by:  As directed      Diet - low sodium heart healthy    Complete by:  As directed   Age appropriate regular solids;Thin  Medication Administration: Crushed with puree Compensations: Minimize environmental distractions;Slow rate;Small sips/bites; (watch for oral holding)      Diet Carb Modified    Complete by:  As directed      Increase activity slowly    Complete by:  As directed            Follow-up Information    Follow up with Lance Bosch, NP. Schedule an appointment as soon as possible for a visit in 1 week.   Specialty:  Internal Medicine   Contact information:   [address] [place] [address] [phone]       Total Time spent on discharge equals  45 minutes.  SignedOren Binet 11/11/2014 9:56 AM

## 2014-11-12 ENCOUNTER — Ambulatory Visit: Payer: Medicaid Other | Admitting: Internal Medicine

## 2014-11-12 NOTE — Telephone Encounter (Signed)
[facility] needs clarification on Gabapentin refills and date that it was refilled.Marland KitchenMarland KitchenMarland KitchenMarland Kitchenplease f/u

## 2014-11-13 ENCOUNTER — Ambulatory Visit: Payer: Medicaid Other | Admitting: Physical Medicine & Rehabilitation

## 2014-11-16 ENCOUNTER — Ambulatory Visit: Payer: Medicaid Other

## 2014-11-25 ENCOUNTER — Telehealth: Payer: Self-pay

## 2014-11-25 DIAGNOSIS — E1149 Type 2 diabetes mellitus with other diabetic neurological complication: Secondary | ICD-10-CM

## 2014-11-25 MED ORDER — GABAPENTIN 100 MG PO CAPS: 100.0000 mg | ORAL_CAPSULE | Freq: Three times a day (TID) | ORAL | Status: DC

## 2014-11-25 NOTE — Telephone Encounter (Signed)
[facility] called, left message requesting clarification of patients gabapentin prescription. Nurse called [facility], pharmacy needed prescription refill on gabapentin.  Nurse sent prescription refill to [facility].

## 2014-12-11 ENCOUNTER — Ambulatory Visit (HOSPITAL_BASED_OUTPATIENT_CLINIC_OR_DEPARTMENT_OTHER): Payer: Medicaid Other | Admitting: Physical Medicine & Rehabilitation

## 2014-12-11 ENCOUNTER — Encounter: Payer: Self-pay | Admitting: Physical Medicine & Rehabilitation

## 2014-12-11 ENCOUNTER — Encounter: Payer: Medicaid Other | Attending: Physical Medicine & Rehabilitation

## 2014-12-11 VITALS — BP 132/87 | HR 104

## 2014-12-11 DIAGNOSIS — E119 Type 2 diabetes mellitus without complications: Secondary | ICD-10-CM | POA: Diagnosis not present

## 2014-12-11 DIAGNOSIS — I639 Cerebral infarction, unspecified: Secondary | ICD-10-CM | POA: Diagnosis not present

## 2014-12-11 DIAGNOSIS — R482 Apraxia: Secondary | ICD-10-CM | POA: Diagnosis not present

## 2014-12-11 DIAGNOSIS — F319 Bipolar disorder, unspecified: Secondary | ICD-10-CM | POA: Insufficient documentation

## 2014-12-11 DIAGNOSIS — F329 Major depressive disorder, single episode, unspecified: Secondary | ICD-10-CM | POA: Diagnosis not present

## 2014-12-11 DIAGNOSIS — IMO0002 Reserved for concepts with insufficient information to code with codable children: Secondary | ICD-10-CM

## 2014-12-11 DIAGNOSIS — I69390 Apraxia following cerebral infarction: Secondary | ICD-10-CM

## 2014-12-11 DIAGNOSIS — I69320 Aphasia following cerebral infarction: Secondary | ICD-10-CM

## 2014-12-11 DIAGNOSIS — G8190 Hemiplegia, unspecified affecting unspecified side: Secondary | ICD-10-CM

## 2014-12-11 DIAGNOSIS — G8110 Spastic hemiplegia affecting unspecified side: Secondary | ICD-10-CM | POA: Diagnosis not present

## 2014-12-11 DIAGNOSIS — F209 Schizophrenia, unspecified: Secondary | ICD-10-CM | POA: Diagnosis not present

## 2014-12-11 DIAGNOSIS — M7501 Adhesive capsulitis of right shoulder: Secondary | ICD-10-CM | POA: Diagnosis not present

## 2014-12-11 DIAGNOSIS — G8191 Hemiplegia, unspecified affecting right dominant side: Secondary | ICD-10-CM

## 2014-12-11 DIAGNOSIS — I10 Essential (primary) hypertension: Secondary | ICD-10-CM | POA: Insufficient documentation

## 2014-12-11 NOTE — Progress Notes (Signed)
Subjective:    Patient ID: [name], female    DOB: 1960/06/17, 54 y.o.   MRN: [id number]  HPI  54 year old female with history of left MCA infarct 05/27/2013. She went through inpatient rehabilitation followed by outpatient rehabilitation. Hospitalized August 21 through August 24 for increased swallowing problems. MRI of the brain did not show any new stroke. Patient was discharged to a skilled nursing facility. She was there approximately 3 weeks. There was an issue at the skilled nursing facility and there is a criminal investigation. Patient was discharged earlier than  Expected. No home health therapy has been set up.  Patient has had home modifications which make it easier for her to get around. Patient requires assistance for dressing and bathing. Patient ambulates with a hemiwalker and supervision assistance.  Appetite is variable. No dietary restrictions in terms of thickeners.  Takes baclofen twice a day for spasms. Pain Inventory Average Pain 3 Pain Right Now 6 My pain is sharp, dull, stabbing and aching  In the last 24 hours, has pain interfered with the following? General activity 5 Relation with others 5 Enjoyment of life 5 What TIME of day is your pain at its worst? daytime, evening Sleep (in general) Fair  Pain is worse with: walking, bending, sitting, inactivity, standing and some activites Pain improves with: rest, therapy/exercise and medication Relief from Meds: 5  Mobility walk without assistance walk with assistance use a walker ability to climb steps?  yes do you drive?  no use a wheelchair needs help with transfers Do you have any goals in this area?  yes  Function disabled: date disabled 05/27/13 I need assistance with the following:  feeding, dressing, bathing, toileting, meal prep, household duties and shopping Do you have any goals in this area?  yes  Neuro/Psych numbness tingling trouble walking spasms dizziness  Prior  Studies Any changes since last visit?  no  Physicians involved in your care Any changes since last visit?  no Primary care Chari Manning, NPN Neurologist Dr. Erlinda Hong   Family History  Problem Relation Age of Onset  . Hypertension Mother   . Stroke Father   . Hypertension Father   . Diabetes Father    Social History   Social History  . Marital Status: Single    Spouse Name: N/A  . Number of Children: 3  . Years of Education: 12   Occupational History  . disabled    Social History Main Topics  . Smoking status: Never Smoker   . Smokeless tobacco: Never Used  . Alcohol Use: No  . Drug Use: No  . Sexual Activity: Not Asked   Other Topics Concern  . None   Social History Narrative   Patient is single with 3 children.   Patient is right handed.   Patient has hs education.   Patient does not drink caffeine.   Past Surgical History  Procedure Laterality Date  . Cesarean section  ~ 1987  . Tubal ligation    . Tee without cardioversion N/A 12/08/2013    Procedure: TRANSESOPHAGEAL ECHOCARDIOGRAM (TEE);  Surgeon: Candee Furbish, MD;  Location: [facility] ENDOSCOPY;  Service: Cardiovascular;  Laterality: N/A;   Past Medical History  Diagnosis Date  . Hypertension   . Depression   . Diabetes mellitus     ?type (05/29/2013)  . Bipolar affective disorder   . Schizophrenia     [name] 11/28/2007 (05/29/2013)  . Stroke 05/27/2013   BP 132/87 mmHg  Pulse 104  SpO2 100%  Opioid Risk Score:   Fall Risk Score:  `1  Depression screen PHQ 2/9  Depression screen [facility] 2/9 12/11/2014 10/20/2014 06/04/2014 08/22/2013 08/22/2013  Decreased Interest 3 (No Data) 1 0 0  Down, Depressed, Hopeless 3 (No Data) 1 0 0  PHQ - 2 Score 6 - 2 0 0  Altered sleeping - - 1 - -  Tired, decreased energy - - 2 - -  Change in appetite - - 2 - -  Feeling bad or failure about yourself  - - 0 - -  Trouble concentrating - - 2 - -  Moving slowly or fidgety/restless - - 0 - -  Suicidal thoughts - - 0 - -  PHQ-9 Score - -  9 - -     Review of Systems  Constitutional: Positive for unexpected weight change.  Gastrointestinal: Positive for constipation.  All other systems reviewed and are negative.      Objective:   Physical Exam  Constitutional: She is oriented to person, place, and time. She appears well-developed and well-nourished.  HENT:  Head: Normocephalic and atraumatic.  Eyes: Conjunctivae and EOM are normal. Pupils are equal, round, and reactive to light.  Cardiovascular: Normal rate and regular rhythm.   Pulmonary/Chest: Effort normal and breath sounds normal.  Abdominal: Soft. Bowel sounds are normal.  Neurological: She is alert and oriented to person, place, and time.  Psychiatric: She has a normal mood and affect.  Nursing note and vitals reviewed.  Manual muscle testing limited by global aphasia as well as motor apraxia Trace right biceps triceps, 0 at the finger flexors and extensors Right lower extremity has 3 minus hip flexor and knee extensor 2 minus ankle dorsiflexor plantar flexor Wears a right AFO      Assessment & Plan:   1. Right spastic hemiplegia secondary to left MCA distribution infarct. She's had a recent hospitalization for increased weakness no new stroke was diagnosed. She went to a skilled nursing facility  But something happened there that is undergoing criminal investigation. Daughter did not elaborate. Patient has not received home health services after discharge from postacute inpatient care. Will order an advanced home care.  PT and OT. Speech therapy as well. I'll see the patient back in one month. Discussed with daughter agrees with plan

## 2014-12-11 NOTE — Patient Instructions (Signed)
HH should contact you

## 2014-12-17 ENCOUNTER — Encounter: Payer: Self-pay | Admitting: Internal Medicine

## 2014-12-17 ENCOUNTER — Ambulatory Visit (HOSPITAL_BASED_OUTPATIENT_CLINIC_OR_DEPARTMENT_OTHER): Payer: Medicaid Other | Admitting: Internal Medicine

## 2014-12-17 ENCOUNTER — Ambulatory Visit (HOSPITAL_COMMUNITY)
Admission: RE | Admit: 2014-12-17 | Discharge: 2014-12-17 | Disposition: A | Discharge status: Home / Self Care | Payer: Medicaid Other | Source: Ambulatory Visit | Attending: Internal Medicine | Admitting: Internal Medicine

## 2014-12-17 VITALS — BP 123/77 | HR 59 | Temp 99.3°F | Resp 16

## 2014-12-17 DIAGNOSIS — R509 Fever, unspecified: Secondary | ICD-10-CM | POA: Insufficient documentation

## 2014-12-17 DIAGNOSIS — R739 Hyperglycemia, unspecified: Secondary | ICD-10-CM | POA: Insufficient documentation

## 2014-12-17 DIAGNOSIS — I69390 Apraxia following cerebral infarction: Secondary | ICD-10-CM

## 2014-12-17 DIAGNOSIS — R05 Cough: Secondary | ICD-10-CM

## 2014-12-17 DIAGNOSIS — N390 Urinary tract infection, site not specified: Secondary | ICD-10-CM

## 2014-12-17 DIAGNOSIS — R059 Cough, unspecified: Secondary | ICD-10-CM

## 2014-12-17 DIAGNOSIS — IMO0002 Reserved for concepts with insufficient information to code with codable children: Secondary | ICD-10-CM

## 2014-12-17 DIAGNOSIS — E119 Type 2 diabetes mellitus without complications: Secondary | ICD-10-CM

## 2014-12-17 DIAGNOSIS — R482 Apraxia: Secondary | ICD-10-CM

## 2014-12-17 DIAGNOSIS — Z9149 Other personal history of psychological trauma, not elsewhere classified: Secondary | ICD-10-CM

## 2014-12-17 DIAGNOSIS — G8110 Spastic hemiplegia affecting unspecified side: Secondary | ICD-10-CM | POA: Diagnosis not present

## 2014-12-17 DIAGNOSIS — R634 Abnormal weight loss: Secondary | ICD-10-CM | POA: Diagnosis not present

## 2014-12-17 DIAGNOSIS — R82998 Other abnormal findings in urine: Secondary | ICD-10-CM

## 2014-12-17 LAB — POCT URINALYSIS DIPSTICK
Blood, UA: NEGATIVE
GLUCOSE UA: NEGATIVE
KETONES UA: NEGATIVE
Nitrite, UA: NEGATIVE
Protein, UA: NEGATIVE
Spec Grav, UA: 1.02
Urobilinogen, UA: 2
pH, UA: 5

## 2014-12-17 LAB — GLUCOSE, POCT (MANUAL RESULT ENTRY): POC Glucose: 124 mg/dl — AB (ref 70–99)

## 2014-12-17 MED ORDER — CIPROFLOXACIN HCL 500 MG PO TABS: 500.0000 mg | ORAL_TABLET | Freq: Two times a day (BID) | ORAL | Status: DC

## 2014-12-17 MED ORDER — INSULIN GLARGINE 100 UNIT/ML SOLOSTAR PEN: 10.0000 [IU] | PEN_INJECTOR | Freq: Every day | SUBCUTANEOUS | Status: DC

## 2014-12-17 MED ORDER — MIRTAZAPINE 7.5 MG PO TABS: 7.5000 mg | ORAL_TABLET | Freq: Every day | ORAL | Status: DC

## 2014-12-17 NOTE — Progress Notes (Signed)
Patient here for follow up from the hospital Patient stated she thought she was having another stroke Patient is requesting the flu vaccine but has a low grade temp here in the office
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
## 2014-12-18 ENCOUNTER — Telehealth: Payer: Self-pay | Admitting: Physical Medicine & Rehabilitation

## 2014-12-18 NOTE — Telephone Encounter (Signed)
Pete PT with [facility] called about a drug interaction with Simvastatin and Mirtazapine---Level 1 interaction.  Any questions please call him at [phone]

## 2014-12-19 LAB — URINE CULTURE
COLONY COUNT: NO GROWTH
Organism ID, Bacteria: NO GROWTH

## 2014-12-24 ENCOUNTER — Telehealth: Payer: Self-pay

## 2014-12-24 NOTE — Telephone Encounter (Signed)
-----   Message from Lance Bosch, NP sent at 12/23/2014  9:26 PM EDT ----- Normal urine culture.

## 2014-12-24 NOTE — Telephone Encounter (Signed)
Spoke with patient daughter and she is aware of her moms urine culture being normal

## 2014-12-28 ENCOUNTER — Telehealth: Payer: Self-pay

## 2014-12-28 NOTE — Telephone Encounter (Signed)
-----   Message from Nila Nephew, RN sent at 12/21/2014  2:01 PM EDT -----   ----- Message -----    From: Lance Bosch, NP    Sent: 12/18/2014  11:20 AM      To: Nila Nephew, RN  Chest xray is normal. Fever was likely from urine. I will call her once I get culture results. She should have started medication already

## 2014-12-28 NOTE — Telephone Encounter (Signed)
Patient not available Left message on voice mail to return our call 

## 2014-12-29 ENCOUNTER — Other Ambulatory Visit: Payer: Self-pay | Admitting: Internal Medicine

## 2014-12-29 NOTE — Telephone Encounter (Signed)
Patient's daughter called stating her mother has a hard time swallowing, runny nose, and a cough. Patient was tested for pneumonia, but does not know the results yet. Patient's daughter would like to know what she can do. Please f/u.

## 2014-12-31 ENCOUNTER — Telehealth: Payer: Self-pay | Admitting: Clinical

## 2014-12-31 ENCOUNTER — Telehealth: Payer: Self-pay

## 2014-12-31 NOTE — Telephone Encounter (Signed)
Left HIPPA-compliant message to call back Roselyn Reef at [facility] at [phone]

## 2014-12-31 NOTE — Telephone Encounter (Signed)
Returned patient phone call Patient's daughter stated her mom's blood pressure has been running a little Elevated the past couple of days Also stated her mom  Has not been feeling well-sore through runny nose Instructed daughter to make an appt to have her come in to be seen

## 2015-01-01 ENCOUNTER — Telehealth: Payer: Self-pay | Admitting: Clinical

## 2015-01-01 DIAGNOSIS — I10 Essential (primary) hypertension: Secondary | ICD-10-CM | POA: Diagnosis not present

## 2015-01-01 DIAGNOSIS — E119 Type 2 diabetes mellitus without complications: Secondary | ICD-10-CM | POA: Diagnosis not present

## 2015-01-01 DIAGNOSIS — Z794 Long term (current) use of insulin: Secondary | ICD-10-CM

## 2015-01-01 DIAGNOSIS — Z7982 Long term (current) use of aspirin: Secondary | ICD-10-CM | POA: Diagnosis not present

## 2015-01-01 DIAGNOSIS — I69351 Hemiplegia and hemiparesis following cerebral infarction affecting right dominant side: Secondary | ICD-10-CM | POA: Diagnosis not present

## 2015-01-01 NOTE — Telephone Encounter (Signed)
Daughter [name] returning f/u call, says her mother [name] has a therapy appointment set up at a practice on [facility], and says she will take her mother to the appointment.

## 2015-01-08 ENCOUNTER — Ambulatory Visit: Payer: Medicaid Other | Admitting: Internal Medicine

## 2015-01-08 ENCOUNTER — Encounter (HOSPITAL_COMMUNITY): Payer: Self-pay | Admitting: *Deleted

## 2015-01-08 ENCOUNTER — Inpatient Hospital Stay (HOSPITAL_COMMUNITY)
Admission: EM | Admit: 2015-01-08 | Discharge: 2015-01-09 | DRG: 690 | Disposition: A | Discharge status: Home Health | Payer: Medicaid Other | Attending: Student in an Organized Health Care Education/Training Program | Admitting: Student in an Organized Health Care Education/Training Program

## 2015-01-08 ENCOUNTER — Emergency Department (HOSPITAL_COMMUNITY): Payer: Medicaid Other

## 2015-01-08 DIAGNOSIS — Z79899 Other long term (current) drug therapy: Secondary | ICD-10-CM | POA: Diagnosis not present

## 2015-01-08 DIAGNOSIS — Z794 Long term (current) use of insulin: Secondary | ICD-10-CM

## 2015-01-08 DIAGNOSIS — E1140 Type 2 diabetes mellitus with diabetic neuropathy, unspecified: Secondary | ICD-10-CM | POA: Diagnosis present

## 2015-01-08 DIAGNOSIS — E440 Moderate protein-calorie malnutrition: Secondary | ICD-10-CM | POA: Diagnosis present

## 2015-01-08 DIAGNOSIS — I10 Essential (primary) hypertension: Secondary | ICD-10-CM | POA: Diagnosis present

## 2015-01-08 DIAGNOSIS — E1149 Type 2 diabetes mellitus with other diabetic neurological complication: Secondary | ICD-10-CM | POA: Diagnosis present

## 2015-01-08 DIAGNOSIS — R4701 Aphasia: Secondary | ICD-10-CM | POA: Diagnosis not present

## 2015-01-08 DIAGNOSIS — I69351 Hemiplegia and hemiparesis following cerebral infarction affecting right dominant side: Secondary | ICD-10-CM | POA: Clinically undetermined

## 2015-01-08 DIAGNOSIS — R1310 Dysphagia, unspecified: Secondary | ICD-10-CM | POA: Diagnosis present

## 2015-01-08 DIAGNOSIS — R82998 Other abnormal findings in urine: Secondary | ICD-10-CM | POA: Diagnosis present

## 2015-01-08 DIAGNOSIS — E1165 Type 2 diabetes mellitus with hyperglycemia: Secondary | ICD-10-CM | POA: Diagnosis present

## 2015-01-08 DIAGNOSIS — I69392 Facial weakness following cerebral infarction: Secondary | ICD-10-CM

## 2015-01-08 DIAGNOSIS — IMO0002 Reserved for concepts with insufficient information to code with codable children: Secondary | ICD-10-CM

## 2015-01-08 DIAGNOSIS — F329 Major depressive disorder, single episode, unspecified: Secondary | ICD-10-CM | POA: Diagnosis present

## 2015-01-08 DIAGNOSIS — N390 Urinary tract infection, site not specified: Secondary | ICD-10-CM | POA: Diagnosis present

## 2015-01-08 DIAGNOSIS — F319 Bipolar disorder, unspecified: Secondary | ICD-10-CM | POA: Diagnosis present

## 2015-01-08 DIAGNOSIS — I69320 Aphasia following cerebral infarction: Secondary | ICD-10-CM | POA: Diagnosis not present

## 2015-01-08 DIAGNOSIS — Z7982 Long term (current) use of aspirin: Secondary | ICD-10-CM | POA: Diagnosis not present

## 2015-01-08 DIAGNOSIS — F259 Schizoaffective disorder, unspecified: Secondary | ICD-10-CM | POA: Diagnosis present

## 2015-01-08 DIAGNOSIS — R29810 Facial weakness: Secondary | ICD-10-CM | POA: Diagnosis present

## 2015-01-08 DIAGNOSIS — B9689 Other specified bacterial agents as the cause of diseases classified elsewhere: Secondary | ICD-10-CM | POA: Diagnosis not present

## 2015-01-08 LAB — COMPREHENSIVE METABOLIC PANEL
ALBUMIN: 4.2 g/dL (ref 3.5–5.0)
ALT: 21 U/L (ref 14–54)
ANION GAP: 10 (ref 5–15)
AST: 35 U/L (ref 15–41)
Alkaline Phosphatase: 58 U/L (ref 38–126)
BILIRUBIN TOTAL: 0.7 mg/dL (ref 0.3–1.2)
BUN: 11 mg/dL (ref 6–20)
CHLORIDE: 100 mmol/L — AB (ref 101–111)
CO2: 30 mmol/L (ref 22–32)
Calcium: 9.5 mg/dL (ref 8.9–10.3)
Creatinine, Ser: 0.88 mg/dL (ref 0.44–1.00)
GFR calc Af Amer: >60 mL/min (ref >60)
Glucose, Bld: 88 mg/dL (ref 65–99)
POTASSIUM: 4.4 mmol/L (ref 3.5–5.1)
Sodium: 140 mmol/L (ref 135–145)
TOTAL PROTEIN: 7.6 g/dL (ref 6.5–8.1)

## 2015-01-08 LAB — I-STAT CHEM 8, ED
BUN: 16 mg/dL (ref 6–20)
Calcium, Ion: 1.13 mmol/L (ref 1.12–1.23)
Chloride: 101 mmol/L (ref 101–111)
Creatinine, Ser: 0.8 mg/dL (ref 0.44–1.00)
Glucose, Bld: 87 mg/dL (ref 65–99)
HEMATOCRIT: 43 % (ref 36.0–46.0)
HEMOGLOBIN: 14.6 g/dL (ref 12.0–15.0)
POTASSIUM: 4.6 mmol/L (ref 3.5–5.1)
SODIUM: 142 mmol/L (ref 135–145)
TCO2: 30 mmol/L (ref 0–100)

## 2015-01-08 LAB — URINALYSIS, ROUTINE W REFLEX MICROSCOPIC
GLUCOSE, UA: NEGATIVE mg/dL
Hgb urine dipstick: NEGATIVE
Ketones, ur: 15 mg/dL — AB
NITRITE: NEGATIVE
PH: 5.5 (ref 5.0–8.0)
Protein, ur: NEGATIVE mg/dL
SPECIFIC GRAVITY, URINE: 1.027 (ref 1.005–1.030)
Urobilinogen, UA: 1 mg/dL (ref 0.0–1.0)

## 2015-01-08 LAB — DIFFERENTIAL
BASOS ABS: 0 10*3/uL (ref 0.0–0.1)
BASOS PCT: 0 %
EOS ABS: 0.1 10*3/uL (ref 0.0–0.7)
EOS PCT: 1 %
LYMPHS ABS: 2 10*3/uL (ref 0.7–4.0)
Lymphocytes Relative: 31 %
Monocytes Absolute: 0.3 10*3/uL (ref 0.1–1.0)
Monocytes Relative: 5 %
NEUTROS PCT: 63 %
Neutro Abs: 4 10*3/uL (ref 1.7–7.7)

## 2015-01-08 LAB — RAPID URINE DRUG SCREEN, HOSP PERFORMED
Amphetamines: NOT DETECTED
BENZODIAZEPINES: NOT DETECTED
Barbiturates: NOT DETECTED
COCAINE: NOT DETECTED
OPIATES: NOT DETECTED
Tetrahydrocannabinol: NOT DETECTED

## 2015-01-08 LAB — URINE MICROSCOPIC-ADD ON

## 2015-01-08 LAB — PROTIME-INR
INR: 1.08 (ref 0.00–1.49)
PROTHROMBIN TIME: 14.2 s (ref 11.6–15.2)

## 2015-01-08 LAB — APTT: APTT: 28 s (ref 24–37)

## 2015-01-08 LAB — I-STAT TROPONIN, ED: TROPONIN I, POC: 0 ng/mL (ref 0.00–0.08)

## 2015-01-08 LAB — ETHANOL

## 2015-01-08 LAB — CBC
HCT: 41 % (ref 36.0–46.0)
Hemoglobin: 13.9 g/dL (ref 12.0–15.0)
MCH: 30.4 pg (ref 26.0–34.0)
MCHC: 33.9 g/dL (ref 30.0–36.0)
MCV: 89.7 fL (ref 78.0–100.0)
Platelets: 192 10*3/uL (ref 150–400)
RBC: 4.57 MIL/uL (ref 3.87–5.11)
RDW: 11.7 % (ref 11.5–15.5)
WBC: 6.3 10*3/uL (ref 4.0–10.5)

## 2015-01-08 MED ORDER — GABAPENTIN 100 MG PO CAPS
100.0000 mg | ORAL_CAPSULE | Freq: Three times a day (TID) | ORAL | Status: DC
  Administered 2015-01-09: 100 mg via ORAL
  Filled 2015-01-08: qty 1

## 2015-01-08 MED ORDER — ASPIRIN EC 325 MG PO TBEC
325.0000 mg | DELAYED_RELEASE_TABLET | Freq: Every day | ORAL | Status: DC
  Filled 2015-01-08: qty 1

## 2015-01-08 MED ORDER — CIPROFLOXACIN HCL 500 MG PO TABS: 500.0000 mg | ORAL_TABLET | Freq: Two times a day (BID) | ORAL | Status: DC

## 2015-01-08 MED ORDER — SENNA 8.6 MG PO TABS
1.0000 | ORAL_TABLET | Freq: Every day | ORAL | Status: DC
  Filled 2015-01-08 (×2): qty 1

## 2015-01-08 MED ORDER — ENSURE ENLIVE PO LIQD
237.0000 mL | Freq: Two times a day (BID) | ORAL | Status: DC
  Administered 2015-01-09: 237 mL via ORAL
  Filled 2015-01-08 (×4): qty 237

## 2015-01-08 MED ORDER — MIRTAZAPINE 15 MG PO TABS: 7.5000 mg | ORAL_TABLET | Freq: Every day | ORAL | Status: DC

## 2015-01-08 MED ORDER — FLEET ENEMA 7-19 GM/118ML RE ENEM
1.0000 | ENEMA | Freq: Once | RECTAL | Status: AC
  Administered 2015-01-08: 1 via RECTAL
  Filled 2015-01-08: qty 1

## 2015-01-08 MED ORDER — ACETAMINOPHEN 325 MG PO TABS: 650.0000 mg | ORAL_TABLET | ORAL | Status: DC | PRN

## 2015-01-08 MED ORDER — DEXTROSE 5 % IV SOLN
1.0000 g | Freq: Once | INTRAVENOUS | Status: AC
  Administered 2015-01-08: 1 g via INTRAVENOUS
  Filled 2015-01-08: qty 10

## 2015-01-08 MED ORDER — SIMVASTATIN 20 MG PO TABS: 20.0000 mg | ORAL_TABLET | Freq: Every day | ORAL | Status: DC

## 2015-01-08 MED ORDER — BACLOFEN 10 MG PO TABS
10.0000 mg | ORAL_TABLET | Freq: Two times a day (BID) | ORAL | Status: DC
  Filled 2015-01-08 (×2): qty 1

## 2015-01-08 MED ORDER — POLYETHYLENE GLYCOL 3350 17 G PO PACK: 17.0000 g | PACK | Freq: Every day | ORAL | Status: DC | PRN

## 2015-01-08 MED ORDER — CIPROFLOXACIN HCL 500 MG PO TABS
500.0000 mg | ORAL_TABLET | Freq: Two times a day (BID) | ORAL | Status: DC
  Administered 2015-01-09: 500 mg via ORAL
  Filled 2015-01-08: qty 1

## 2015-01-08 MED ORDER — POLYETHYLENE GLYCOL 3350 17 GM/SCOOP PO POWD
17.0000 g | Freq: Every day | ORAL | Status: DC | PRN
  Filled 2015-01-08: qty 255

## 2015-01-08 MED ORDER — ENOXAPARIN SODIUM 40 MG/0.4ML ~~LOC~~ SOLN
40.0000 mg | SUBCUTANEOUS | Status: DC
  Administered 2015-01-08: 40 mg via SUBCUTANEOUS
  Filled 2015-01-08: qty 0.4

## 2015-01-08 NOTE — Progress Notes (Signed)
Call ED RN back for report, pt's room is not yet ready.

## 2015-01-08 NOTE — Progress Notes (Signed)
Attempted report, RN will call back

## 2015-01-08 NOTE — ED Notes (Signed)
Per EMS patient from home after family called when patient woke up having vomited during the night and had more pronounced right-sided facial droop.  Patient has a history of a CVA in 2015 with ride-side deficits.  Patient follows some commands and is aphasic. CBG en route 104.

## 2015-01-08 NOTE — ED Notes (Signed)
MD at bedside. 

## 2015-01-08 NOTE — ED Notes (Signed)
Report attempted 

## 2015-01-08 NOTE — Consult Note (Signed)
Referring Physician: Pollina    Chief Complaint: Possible extension of CVA  HPI:                                                                                                                                         [name] is an 54 y.o. female who suffered a left MCA infarct back in 2015 and left her with right hemiplegia and aphasia to the point she can only say yes, no and thank you. She had been doing well and able to walk with a hemi walker at home with care of her family. This AM it was noted to throw and she was more confused than normal and only repeating "yaa" to all questions.  Her right facial droop appeared greater than usual and she was weaker on the right.she was brought to ED where head CT was negative but UA was positive for leukocytes.  UC pending.   Date last known well: Date: 01/07/2015 Time last known well: Time: 21:00 tPA Given: No: out of window Modified Rankin: Rankin Score=4    Past Medical History  Diagnosis Date  . Hypertension   . Depression   . Diabetes mellitus     ?type (05/29/2013)  . Bipolar affective disorder   . Schizophrenia     [name] 11/28/2007 (05/29/2013)  . Stroke 05/27/2013    Past Surgical History  Procedure Laterality Date  . Cesarean section  ~ 1987  . Tubal ligation    . Tee without cardioversion N/A 12/08/2013    Procedure: TRANSESOPHAGEAL ECHOCARDIOGRAM (TEE);  Surgeon: Candee Furbish, MD;  Location: [facility] ENDOSCOPY;  Service: Cardiovascular;  Laterality: N/A;    Family History  Problem Relation Age of Onset  . Hypertension Mother   . Stroke Father   . Hypertension Father   . Diabetes Father    Social History:  reports that she has never smoked. She has never used smokeless tobacco. She reports that she does not drink alcohol or use illicit drugs.  Allergies: No Known Allergies  Medications:                                                                                                                           No current  facility-administered medications for this encounter.   Current Outpatient Prescriptions  Medication Sig Dispense Refill  . acetaminophen (TYLENOL) 325 MG tablet Take  1-2 tablets (325-650 mg total) by mouth every 4 (four) hours as needed for mild pain (available over the counter).    . baclofen (LIORESAL) 10 MG tablet Take 1 tablet (10 mg total) by mouth 2 (two) times daily. To help with tone/spasms 60 tablet 1  . gabapentin (NEURONTIN) 100 MG capsule Take 1 capsule (100 mg total) by mouth 3 (three) times daily. 90 capsule 3  . hydrochlorothiazide (HYDRODIURIL) 25 MG tablet Take 1 tablet (25 mg total) by mouth daily. 30 tablet 2  . Insulin Glargine (LANTUS SOLOSTAR) 100 UNIT/ML Solostar Pen Inject 10 Units into the skin daily at 10 pm. Once DAILY 5 pen 11  . metoprolol (LOPRESSOR) 100 MG tablet Take 1 tablet (100 mg total) by mouth 2 (two) times daily.    . mirtazapine (REMERON) 7.5 MG tablet Take 1 tablet (7.5 mg total) by mouth at bedtime. 30 tablet 1  . polyethylene glycol powder (GLYCOLAX/MIRALAX) powder Take 17 g by mouth daily. (Patient taking differently: Take 17 g by mouth daily as needed (constipation). ) 527 g 3  . senna (SENNA LAX) 8.6 MG tablet Take 1 tablet (8.6 mg total) by mouth daily.    . simvastatin (ZOCOR) 20 MG tablet TAKE 1 TABLET BY MOUTH DAILY AT 6 PM 30 tablet 2  . aspirin 325 MG tablet Take 325 mg by mouth daily.    . feeding supplement, ENSURE ENLIVE, (ENSURE ENLIVE) LIQD Take 237 mLs by mouth 2 (two) times daily between meals.    Marland Kitchen glycerin adult (GLYCERIN ADULT) 2 G SUPP Place 1 suppository rectally once as needed for moderate constipation. 10 suppository 0     ROS:                                                                                                                                       History obtained from family member  General ROS: negative for - chills, fatigue, fever, night sweats, weight gain or weight loss Psychological ROS: negative for -  behavioral disorder, hallucinations, memory difficulties, mood swings or suicidal ideation Ophthalmic ROS: negative for - blurry vision, double vision, eye pain or loss of vision ENT ROS: negative for - epistaxis, nasal discharge, oral lesions, sore throat, tinnitus or vertigo Allergy and Immunology ROS: negative for - hives or itchy/watery eyes Hematological and Lymphatic ROS: negative for - bleeding problems, bruising or swollen lymph nodes Endocrine ROS: negative for - galactorrhea, hair pattern changes, polydipsia/polyuria or temperature intolerance Respiratory ROS: negative for - cough, hemoptysis, shortness of breath or wheezing Cardiovascular ROS: negative for - chest pain, dyspnea on exertion, edema or irregular heartbeat Gastrointestinal ROS: negative for - abdominal pain, diarrhea, hematemesis, nausea/vomiting or stool incontinence Genito-Urinary ROS: negative for - dysuria, hematuria, incontinence or urinary frequency/urgency Musculoskeletal ROS: negative for - joint swelling or muscular weakness Neurological ROS: as noted in HPI Dermatological ROS: negative for rash and skin lesion changes  Neurologic Examination:                                                                                                      Blood pressure 145/83, pulse 92, temperature 98.3 F (36.8 C), temperature source Oral, resp. rate 19, SpO2 100 %.  HEENT-  Normocephalic, no lesions, without obvious abnormality.  Normal external eye and conjunctiva.  Normal TM's bilaterally.  Normal auditory canals and external ears. Normal external nose, mucus membranes and septum.  Normal pharynx. Cardiovascular- S1, S2 normal, pulses palpable throughout   Lungs- chest clear, no wheezing, rales, normal symmetric air entry Abdomen- normal findings: bowel sounds normal Extremities- less then 2 second capillary refill Lymph-no adenopathy palpable Musculoskeletal-no joint tenderness, deformity or swelling Skin-warm and  dry, no hyperpigmentation, vitiligo, or suspicious lesions  Neurological Examination Mental Status: Alert, unable to follow verbal commands (at baseline able to follow some simple commands), perseverating on "yaa".  Cranial Nerves: II: Discs flat bilaterally; Visual fields grossly normal, pupils equal, round, reactive to light and accommodation III,IV, VI: ptosis not present, extra-ocular motions intact bilaterally V,VII: smile asymmetric on the right, facial light touch sensation intact bilaterally VIII: hearing normal bilaterally IX,X: uvula rises symmetrically XI: bilateral shoulder shrug XII: midline tongue extension Motor: Right : Upper extremity   2/5-flexion contracture at elbow    Left:     Upper extremity   4/5  Lower extremity   2/5-increased tone       Lower extremity   4/5 Tone and bulk:normal tone throughout; no atrophy noted Sensory: Pinprick and light touch intact throughout, bilaterally Deep Tendon Reflexes: 3+ on the right UE and 2+ on the left UE. 3+ on the right KJ and 2+ on the right KJ.  left LE shows 2+ at USAA and AJ Plantars: Right: downgoing   Left: downgoing Cerebellar: normal finger-to-nose on the left, Gait: not tested       Lab Results: Basic Metabolic Panel:  Recent Labs Lab 01/08/15 1030 01/08/15 1040  NA 140 142  K 4.4 4.6  CL 100* 101  CO2 30  --   GLUCOSE 88 87  BUN 11 16  CREATININE 0.88 0.80  CALCIUM 9.5  --     Liver Function Tests:  Recent Labs Lab 01/08/15 1030  AST 35  ALT 21  ALKPHOS 58  BILITOT 0.7  PROT 7.6  ALBUMIN 4.2   No results for input(s): LIPASE, AMYLASE in the last 168 hours. No results for input(s): AMMONIA in the last 168 hours.  CBC:  Recent Labs Lab 01/08/15 1030 01/08/15 1040  WBC 6.3  --   NEUTROABS 4.0  --   HGB 13.9 14.6  HCT 41.0 43.0  MCV 89.7  --   PLT 192  --     Cardiac Enzymes: No results for input(s): CKTOTAL, CKMB, CKMBINDEX, TROPONINI in the last 168 hours.  Lipid  Panel: No results for input(s): CHOL, TRIG, HDL, CHOLHDL, VLDL, LDLCALC in the last 168 hours.  CBG: No results for input(s): GLUCAP in the last 168 hours.  Microbiology: Results for orders placed  or performed in visit on 12/17/14  Urine culture     Status: None   Collection Time: 12/17/14  6:04 PM  Result Value Ref Range Status   Colony Count NO GROWTH  Final   Organism ID, Bacteria NO GROWTH  Final    Coagulation Studies:  Recent Labs  01/08/15 1030  LABPROT 14.2  INR 1.08    Imaging: Ct Head Wo Contrast  01/08/2015  CLINICAL DATA:  Aphasia EXAM: CT HEAD WITHOUT CONTRAST TECHNIQUE: Contiguous axial images were obtained from the base of the skull through the vertex without intravenous contrast. COMPARISON:  11/08/2014 FINDINGS: No skull fracture is noted. Paranasal sinuses and mastoid air cells are unremarkable. Stable encephalomalacia and remote left MCA infarct. Mild cerebral atrophy again noted. Stable periventricular chronic white matter disease. No acute cortical infarction. No mass lesion is noted on this unenhanced scan. No intracranial hemorrhage, mass effect or midline shift. IMPRESSION: Stable old left MCA infarct. No definite acute cortical infarction. Stable mild cerebral atrophy and chronic white matter disease. Electronically Signed   By: Lahoma Crocker M.D.   On: 01/08/2015 11:38       Assessment and plan discussed with with attending physician and they are in agreement.    Etta Quill PA-C [facility] [phone]  01/08/2015, 2:16 PM   Assessment: 54 y.o. female brought to ED due to worsening of old right sided deficit and increased aphasia.  Exam is notable for patient perseverating and unable to follow verbal commands. In ED found to have UTI and currently on Rocephine.  Differential includes acute extension of left sided stroke versus worsening of deficits from prior stroke in the setting of UTI possible sepsis.  Stroke Risk Factors - diabetes  mellitus and hypertension   Recommend: 1) MRI brain--If negative no further no further stroke work up warranted. If positive for stroke would continue with stroke work up including carotid Doppler, Echo, LDL, A1c, MRA head and ASA daily 2) Continue to treat UTI  I personally participated in this patient's evaluation and management, including formulating the above clinical impression and management recommendations.  Rush Farmer M.D. [facility] [phone]

## 2015-01-08 NOTE — ED Notes (Signed)
Patient transported to MRI 

## 2015-01-08 NOTE — Progress Notes (Signed)
Patient arrived to 5M18 from the ED at shift change. Family at bedside. TELE applied and confirmed. Report to on coming RN.   Ave Filter, RN

## 2015-01-08 NOTE — ED Notes (Signed)
Neurology at bedside.

## 2015-01-08 NOTE — ED Provider Notes (Signed)
CSN: [id number]     Arrival date & time 01/08/15  4098 History   First MD Initiated Contact with Patient 01/08/15 [phone]     Chief Complaint  Patient presents with  . Stroke Symptoms     (Consider location/radiation/quality/duration/timing/severity/associated sxs/prior Treatment) HPI Comments: Patient brought to the emergency department with concerns of stroke. 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 Family reportedly noticed difficulty with speech and possible left-sided weakness this morning. At arrival to the ER patient is a phasic, cannot answer any questions but follows commands without difficulty. Level V Caveat due to nonverbal.   Past Medical History  Diagnosis Date  . Hypertension   . Depression   . Diabetes mellitus     ?type (05/29/2013)  . Bipolar affective disorder   . Schizophrenia     [name] 11/28/2007 (05/29/2013)  . Stroke 05/27/2013   Past Surgical History  Procedure Laterality Date  . Cesarean section  ~ 1987  . Tubal ligation    . Tee without cardioversion N/A 12/08/2013    Procedure: TRANSESOPHAGEAL ECHOCARDIOGRAM (TEE);  Surgeon: Candee Furbish, MD;  Location[facility] ENDOSCOPY;  Service: Cardiovascular;  Laterality: N/A;   Family History  Problem Relation Age of Onset  . Hypertension Mother   . Stroke Father   . Hypertension Father   . Diabetes Father    Social History  Substance Use Topics  . Smoking status: Never Smoker   . Smokeless tobacco: Never Used  . Alcohol Use: No   OB History    No data available     Review of Systems  Unable to perform ROS: Patient nonverbal      Allergies  Review of patient's allergies indicates no known allergies.  Home Medications   Prior to Admission medications   Medication Sig Start Date End Date Taking? Authorizing Provider  acetaminophen (TYLENOL) 325 MG tablet Take 1-2 tablets (325-650 mg total) by mouth every 4 (four) hours as needed for mild pain (available over the  counter). 07/08/13   Ivan Anchors Love, PA-C  amLODipine (NORVASC) 10 MG tablet Take 1 tablet (10 mg total) by mouth daily. For blood pressure 03/11/14   Lance Bosch, NP  aspirin 325 MG tablet Take 325 mg by mouth daily.    Historical Provider, MD  baclofen (LIORESAL) 10 MG tablet Take 1 tablet (10 mg total) by mouth 2 (two) times daily. To help with tone/spasms 11/06/14   Lance Bosch, NP  ciprofloxacin (CIPRO) 500 MG tablet Take 1 tablet (500 mg total) by mouth 2 (two) times daily. 12/17/14   Lance Bosch, NP  feeding supplement, ENSURE ENLIVE, (ENSURE ENLIVE) LIQD Take 237 mLs by mouth 2 (two) times daily between meals. 11/11/14   Shanker Kristeen Mans, MD  gabapentin (NEURONTIN) 100 MG capsule Take 1 capsule (100 mg total) by mouth 3 (three) times daily. 11/25/14   Lance Bosch, NP  glycerin adult (GLYCERIN ADULT) 2 G SUPP Place 1 suppository rectally once as needed for moderate constipation. 05/31/14   Linton Flemings, MD  hydrochlorothiazide (HYDRODIURIL) 25 MG tablet Take 1 tablet (25 mg total) by mouth daily. 08/14/14   Lance Bosch, NP  Insulin Glargine (LANTUS SOLOSTAR) 100 UNIT/ML Solostar Pen Inject 10 Units into the skin daily at 10 pm. Once DAILY 12/17/14   Lance Bosch, NP  metoprolol (LOPRESSOR) 100 MG tablet Take 1 tablet (100 mg total) by mouth 2 (two) times daily. 11/11/14   Jonetta Osgood, MD  mirtazapine (REMERON) 7.5 MG tablet Take 1 tablet (7.5 mg total) by mouth at bedtime. 12/17/14   Lance Bosch, NP  polyethylene glycol powder (GLYCOLAX/MIRALAX) powder Take 17 g by mouth daily. Patient taking differently: Take 17 g by mouth daily as needed (constipation).  07/29/14   Lance Bosch, NP  potassium chloride 20 MEQ TBCR Take 20 mEq by mouth daily. 11/11/14   Shanker Kristeen Mans, MD  senna (SENNA LAX) 8.6 MG tablet Take 1 tablet (8.6 mg total) by mouth daily. 11/11/14   Shanker Kristeen Mans, MD  simvastatin (ZOCOR) 20 MG tablet TAKE 1 TABLET BY MOUTH DAILY AT 6 PM 10/29/14   Lance Bosch, NP    BP 135/83 mmHg  Pulse 60  Temp(Src) 98.3 F (36.8 C) (Oral)  Resp 10  SpO2 100% Physical Exam  Constitutional: She appears well-developed and well-nourished. No distress.  HENT:  Head: Normocephalic and atraumatic.  Right Ear: Hearing normal.  Left Ear: Hearing normal.  Nose: Nose normal.  Mouth/Throat: Oropharynx is clear and moist and mucous membranes are normal.  Eyes: Conjunctivae and EOM are normal. Pupils are equal, round, and reactive to light.  Neck: Normal range of motion. Neck supple.  Cardiovascular: Regular rhythm, S1 normal and S2 normal.  Exam reveals no gallop and no friction rub.   No murmur heard. Pulmonary/Chest: Effort normal and breath sounds normal. No respiratory distress. She exhibits no tenderness.  Abdominal: Soft. Normal appearance and bowel sounds are normal. There is no hepatosplenomegaly. There is no tenderness. There is no rebound, no guarding, no tenderness at McBurney's point and negative Murphy's sign. No hernia.  Musculoskeletal: Normal range of motion.  Neurological: She is alert. A cranial nerve deficit (right facial droop) is present. No sensory deficit. She exhibits abnormal muscle tone. Coordination normal. GCS eye subscore is 4. GCS verbal subscore is 2. GCS motor subscore is 6.  Right hemiparesis which is apparently baseline  Left upper extremity strength 4 out of 5 no pronator drift Left lower extremity strength 3 out of 5 against gravity  Expressive aphasia present, no receptive aphasia  Skin: Skin is warm, dry and intact. No rash noted. No cyanosis.  Psychiatric: She has a normal mood and affect. Her speech is normal and behavior is normal. Thought content normal.  Nursing note and vitals reviewed.   ED Course  Procedures (including critical care time) Labs Review Labs Reviewed - No data to display  Imaging Review No results found. I have personally reviewed and evaluated these images and lab results as part of my medical  decision-making.   EKG Interpretation   Date/Time:  Friday January 08 2015 08:50:36 EDT Ventricular Rate:  61 PR Interval:  133 QRS Duration: 72 QT Interval:  429 QTC Calculation: 432 R Axis:   67 Text Interpretation:  Sinus rhythm Probable anteroseptal infarct, old  Baseline wander in lead(s) II III aVF No significant change since last  tracing Confirmed by Wendal Wilkie  MD, [employer] [phone]) on 01/08/2015  8:54:25 AM      MDM   Final diagnoses:  None    Patient presents to the emergency department with stroke symptoms. Patient's last known normal neurologic normal was 9 PM last night when she went to bed. She awakened with symptoms today. She is not a candidate for thrombolytic intervention.  Patient reportedly has right hemiparesis at baseline, but is normally able to hold a conversation. Patient appears to have complete expressive aphasia, but does understand and follow commands. This is reportedly  a new problem for her. CT scan does not show any obvious new stroke. Remainder of workup reveals evidence of UTI, treated with Rocephin. Patient will require hospitalization for further management.  Orpah Greek, MD 01/08/15 [phone]
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
## 2015-01-09 DIAGNOSIS — B9689 Other specified bacterial agents as the cause of diseases classified elsewhere: Secondary | ICD-10-CM

## 2015-01-09 DIAGNOSIS — N390 Urinary tract infection, site not specified: Principal | ICD-10-CM

## 2015-01-09 LAB — URINE CULTURE

## 2015-01-09 MED ORDER — CEFTRIAXONE SODIUM 1 G IJ SOLR
1.0000 g | Freq: Once | INTRAMUSCULAR | Status: DC
  Filled 2015-01-09: qty 10

## 2015-01-09 MED ORDER — CIPROFLOXACIN HCL 500 MG PO TABS: 500.0000 mg | ORAL_TABLET | Freq: Two times a day (BID) | ORAL | Status: DC

## 2015-01-09 MED ORDER — CEFTRIAXONE SODIUM 1 G IJ SOLR: 1.0000 g | Freq: Once | INTRAMUSCULAR | Status: DC

## 2015-01-09 MED ORDER — METOPROLOL TARTRATE 100 MG PO TABS
100.0000 mg | ORAL_TABLET | Freq: Two times a day (BID) | ORAL | Status: DC
  Filled 2015-01-09: qty 1

## 2015-01-09 MED ORDER — HYDROCHLOROTHIAZIDE 25 MG PO TABS
25.0000 mg | ORAL_TABLET | Freq: Every day | ORAL | Status: DC
  Filled 2015-01-09: qty 1

## 2015-01-09 NOTE — Progress Notes (Signed)
D/C orders received, pt for D/C home today with home health PT.  IV and telemetry D/C.  Rx and D/C instructions given with verbalized understanding.  Family at bedside to assist with D/C.  Staff brought pt downstairs via wheelchair.   

## 2015-01-09 NOTE — Progress Notes (Signed)
Subjective: No adverse overnight events reported.  Objective: Current vital signs: BP 143/86 mmHg  Pulse 114  Temp(Src) 99 F (37.2 C) (Oral)  Resp 20  SpO2 100%  Neurologic Exam: Patient was alert and in no acute distress. She had marked expressive aphasia. Her septum of aphasia was minimal. She was able to follow verbal commands without difficulty Extraocular movements were full and conjugate. Mild right lower facial weakness was noted. Speech was moderately dysarthric and patient tended to perseverate with 1-2 syllable verbal responses. Patient had severe weakness proximally and distally of right upper extremity with moderately severe weakness proximally and distally right lower extremity. Coordination of left upper extremity was normal.  MRI showed stable remote after MCA territory infarction with no acute cranial findings.  Medications: I have reviewed the patient's current medications.  Assessment/Plan: 54 year old lady with history of left MCA territory stroke with residual expressive aphasia and right hemiparesis presenting with acute worsening of aphasia and hemiparesis, most likely manifestation of delirium associated with acute infectious process. MRI showed no signs of an acute recurrent stroke. Worsening of neurologic deficits is expected to be transient.  No further neurodiagnostic studies are recommended at this point. We will continue to follow this patient with you for now.  C.R. Nicole Kindred, MD [facility] [phone]  01/09/2015  9:20 AM

## 2015-01-09 NOTE — Evaluation (Signed)
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  MRI neg for acute stroke, possible a hypoactive delirium contributed to her stroke-like findings.  Additional PMH includes DM, depression, bipolar affective disorder, schizophrenia.  Clinical Impression  Pt admitted with above diagnosis. Pt currently with functional limitations due to the deficits listed below (see PT Problem List). [name] was limited 2/2 expressive aphasia and discomfort during stand pivot.  She required total assist for sit<>stand and stand pivot and is planning to return home upon d/c. Pt will benefit from skilled PT to increase their independence and safety with mobility to allow discharge to the venue listed below.     Follow Up Recommendations Home health PT;Supervision/Assistance - 24 hour (max out [facility] services for 24/7 care)    Equipment Recommendations  None recommended by PT    Recommendations for Other Services       Precautions / Restrictions Precautions Precautions: Fall Precaution Comments: Rt hemiparesis Restrictions Weight Bearing Restrictions: No      Mobility  Bed Mobility Overal bed mobility: Needs Assistance Bed Mobility: Supine to Sit     Supine to sit: Min assist;HOB elevated     General bed mobility comments: HOB elevated and min assist required for pt to achieve scooting to EOB, otherwise increased time.  Transfers Overall transfer level: Needs assistance Equipment used: 1 person hand held assist Transfers: Sit to/from [facility] Sit to Stand: Total assist Stand pivot transfers: Total assist       General transfer comment: Use of gait belt and Bil knees blocked.  Pt grimacing in  standing so assist in sitting pt in chair, pt shuffles her Lt foot minimally.  Ambulation/Gait                Stairs            Wheelchair Mobility    Modified Rankin (Stroke Patients Only) Modified Rankin (Stroke Patients Only) Pre-Morbid Rankin Score: Severe disability Modified Rankin: Severe disability     Balance Overall balance assessment: Needs assistance Sitting-balance support: Single extremity supported;Feet supported Sitting balance-Leahy Scale: Fair     Standing balance support: Bilateral upper extremity supported;During functional activity Standing balance-Leahy Scale: Zero                               Pertinent Vitals/Pain Pain Assessment: Faces Faces Pain Scale: Hurts even more Pain Location: Pt grimacing during stand pivot transfer and points to Rt LE when asked where she has pain Pain Descriptors / Indicators: Grimacing Pain Intervention(s): Limited activity within patient's tolerance;Monitored during session;Repositioned    Home Living Family/patient expects to be discharged to:: Private residence Living Arrangements: Children Available Help at Discharge: Family;Personal care attendant;Available 24 hours/day Type of Home: House Home Access: Ramped entrance     Home Layout: One level Home Equipment: Wheelchair - manual Photographer) Additional Comments: Pt's daughter is a Ship broker is pt's primary caregiver.  Per phone conversation w/ daughter, pt has care provider who cares for pt when daughter is not there.    Prior Function Level of Independence: Needs assistance   Gait / Transfers Assistance Needed: Pt requires assist to maneuver WC.  Before pt began feeling poorly she was using hemiwalker to ambulate short distances w/ assist for stability.  Otherwise was pushed in [place].  ADL's / Homemaking Assistance Needed: Dependent in grooming, all bathing, dressing, and toileting.  Comments: Information from phone conversation w/  daughter as pt responds to yes and no questions only     Hand Dominance   Dominant Hand: Right    Extremity/Trunk Assessment   Upper Extremity Assessment: RUE deficits/detail;Generalized weakness RUE Deficits / Details: grossly 0/5, pt uses Lt UE to assist w/ any movement w/ Rt UE         Lower Extremity Assessment: RLE deficits/detail;LLE deficits/detail;Generalized weakness RLE Deficits / Details: 0/5 DF, 3/5 Rt knee extension/flexion and hip flexion LLE Deficits / Details: grossly 3/5     Communication   Communication: Expressive difficulties  Cognition Arousal/Alertness: Awake/alert Behavior During Therapy: Flat affect Overall Cognitive Status: Difficult to assess                      General Comments General comments (skin integrity, edema, etc.): Pain may be in Rt calf upon standing 2/2 limited ROM into DF?    Exercises General Exercises - Lower Extremity Ankle Circles/Pumps: PROM;Right;10 reps;Supine;AROM;Left Straight Leg Raises: AROM;Right;Left;5 reps;Supine Other Exercises Other Exercises: Streching of Rt UE digits and Rt elbow into extension      Assessment/Plan    PT Assessment Patient needs continued PT services  PT Diagnosis Difficulty walking;Abnormality of gait;Generalized weakness;Acute pain;Hemiplegia dominant side   PT Problem List Decreased strength;Decreased range of motion;Decreased balance;Decreased activity tolerance;Decreased mobility;Decreased cognition;Decreased knowledge of use of DME;Decreased safety awareness;Decreased knowledge of precautions;Pain  PT Treatment Interventions DME instruction;Gait training;Functional mobility training;Therapeutic activities;Therapeutic exercise;Balance training;Neuromuscular re-education;Cognitive remediation;Patient/family education;Wheelchair mobility training   PT Goals (Current goals can be found in the Care Plan section) Acute Rehab PT Goals Patient Stated Goal: unable to state PT Goal  Formulation: Patient unable to participate in goal setting Time For Goal Achievement: 01/23/15 Potential to Achieve Goals: Fair    Frequency Min 3X/week   Barriers to discharge        Co-evaluation               End of Session Equipment Utilized During Treatment: Gait belt Activity Tolerance: Patient limited by pain;Patient tolerated treatment well Patient left: in chair;with call bell/phone within reach;with chair alarm set Nurse Communication: Mobility status;Other (comment);Precautions (pt grimacing during transfer as if in pain, unable to locate)         Time: 2423-5361 PT Time Calculation (min) (ACUTE ONLY): 22 min   Charges:   PT Evaluation $Initial PT Evaluation Tier I: 1 Procedure     PT G Codes:       Joslyn Hy PT, DPT [phone] Pager: [phone] 01/09/2015, 3:17 PM

## 2015-01-09 NOTE — Progress Notes (Signed)
Subjective: [name].  Patient denies complaint this morning.  She is happy there is no new stroke and would like to go home.  Objective: Vital signs in last 24 hours: Filed Vitals:   01/09/15 0000 01/09/15 0200 01/09/15 0400 01/09/15 0600  BP: 133/84 144/83 148/89 143/86  Pulse: 104 110 107 114  Temp: 99.3 F (37.4 C) 99 F (37.2 C) 98.8 F (37.1 C) 99 F (37.2 C)  TempSrc: Oral Oral Oral Oral  Resp: 18 20 22 20   SpO2: 100% 100% 100% 100%   Weight change:   Intake/Output Summary (Last 24 hours) at 01/09/15 0955 Last data filed at 01/08/15 1842  Gross per 24 hour  Intake      0 ml  Output      0 ml  Net      0 ml   Physical Exam  Constitutional: She is well-developed, well-nourished, and in no distress.  HENT:  Head: Normocephalic and atraumatic.  Right facial droop noted.  Eyes: EOM are normal.  Neck: Normal range of motion. No tracheal deviation present.  Cardiovascular: Normal rate, regular rhythm and normal heart sounds.   Pulmonary/Chest: Effort normal and breath sounds normal. No respiratory distress. She has no wheezes.  Abdominal: Soft. She exhibits no distension. There is no tenderness. There is no rebound and no guarding.  Musculoskeletal: She exhibits no edema.  Neurological: She is alert.  EOMI.  Right arm contracted.  Spontaneous movement of left arm and leg.  Skin: Skin is warm and dry. No rash noted.    Lab Results: Basic Metabolic Panel:  Recent Labs Lab 01/08/15 1030 01/08/15 1040  NA 140 142  K 4.4 4.6  CL 100* 101  CO2 30  --   GLUCOSE 88 87  BUN 11 16  CREATININE 0.88 0.80  CALCIUM 9.5  --    Liver Function Tests:  Recent Labs Lab 01/08/15 1030  AST 35  ALT 21  ALKPHOS 58  BILITOT 0.7  PROT 7.6  ALBUMIN 4.2   No results for input(s): LIPASE, AMYLASE in the last 168 hours. No results for input(s): AMMONIA in the last 168 hours. CBC:  Recent Labs Lab 01/08/15 1030 01/08/15 1040  WBC 6.3  --   NEUTROABS 4.0  --   HGB  13.9 14.6  HCT 41.0 43.0  MCV 89.7  --   PLT 192  --    Cardiac Enzymes: No results for input(s): CKTOTAL, CKMB, CKMBINDEX, TROPONINI in the last 168 hours. BNP: No results for input(s): PROBNP in the last 168 hours. D-Dimer: No results for input(s): DDIMER in the last 168 hours. CBG: No results for input(s): GLUCAP in the last 168 hours. Hemoglobin A1C: No results for input(s): HGBA1C in the last 168 hours. Fasting Lipid Panel: No results for input(s): CHOL, HDL, LDLCALC, TRIG, CHOLHDL, LDLDIRECT in the last 168 hours. Thyroid Function Tests: No results for input(s): TSH, T4TOTAL, FREET4, T3FREE, THYROIDAB in the last 168 hours. Coagulation:  Recent Labs Lab 01/08/15 1030  LABPROT 14.2  INR 1.08   Anemia Panel: No results for input(s): VITAMINB12, FOLATE, FERRITIN, TIBC, IRON, RETICCTPCT in the last 168 hours. Urine Drug Screen: Drugs of Abuse     Component Value Date/Time   LABOPIA NONE DETECTED 01/08/2015 1047   COCAINSCRNUR NONE DETECTED 01/08/2015 1047   LABBENZ NONE DETECTED 01/08/2015 1047   AMPHETMU NONE DETECTED 01/08/2015 1047   THCU NONE DETECTED 01/08/2015 1047   LABBARB NONE DETECTED 01/08/2015 1047    Alcohol Level:  Recent Labs Lab 01/08/15 1030  ETH <5   Urinalysis:  Recent Labs Lab 01/08/15 1047  COLORURINE AMBER*  LABSPEC 1.027  PHURINE 5.5  GLUCOSEU NEGATIVE  HGBUR NEGATIVE  BILIRUBINUR SMALL*  KETONESUR 15*  PROTEINUR NEGATIVE  UROBILINOGEN 1.0  NITRITE NEGATIVE  LEUKOCYTESUR MODERATE*   Misc. Labs:   Micro Results: No results found for this or any previous visit (from the past 240 hour(s)). Studies/Results: Ct Head Wo Contrast  01/08/2015  CLINICAL DATA:  Aphasia EXAM: CT HEAD WITHOUT CONTRAST TECHNIQUE: Contiguous axial images were obtained from the base of the skull through the vertex without intravenous contrast. COMPARISON:  11/08/2014 FINDINGS: No skull fracture is noted. Paranasal sinuses and mastoid air cells are  unremarkable. Stable encephalomalacia and remote left MCA infarct. Mild cerebral atrophy again noted. Stable periventricular chronic white matter disease. No acute cortical infarction. No mass lesion is noted on this unenhanced scan. No intracranial hemorrhage, mass effect or midline shift. IMPRESSION: Stable old left MCA infarct. No definite acute cortical infarction. Stable mild cerebral atrophy and chronic white matter disease. Electronically Signed   By: Lahoma Crocker M.D.   On: 01/08/2015 11:38   [name] Contrast  01/08/2015  CLINICAL DATA:  Aphasia EXAM: MRI HEAD WITHOUT CONTRAST TECHNIQUE: Multiplanar, multiecho pulse sequences of the brain and surrounding structures were obtained without intravenous contrast. COMPARISON:  11/08/2014 FINDINGS: Calvarium and upper cervical spine: No focal marrow signal abnormality. Orbits: No significant findings. Sinuses and Mastoids: Clear. Brain: Remote left MCA territory infarct involving the posterior insula, temporal operculum and perisylvian cortex. Neighboring linear DWI hyperintensity in the left centrum semiovale is initially concerning for peri-infarct ischemia, but is chronic based on previous study and inconsistent with acute infarct given persistence (likely artifact related to neighboring hemosiderin staining). There is associated chronic abnormal flow within the left MCA branches which are asymmetrically small. Wallerian degeneration in the brainstem. Chronic small vessel disease in the bilateral cerebral white matter. Abnormal hyperintensity within bilateral basal ganglia is stable over multiple studies and given symmetry is likely related to previous toxic/ metabolic insult (such as extrapontine myelinolysis or hypoxic ischemic injury). No acute hemorrhage, hydrocephalus, or acute major vessel occlusion. Remote lacunar infarct in the right thalamus. Cortical laminar necrosis in the left temporal occipital region. IMPRESSION: 1. No acute finding. 2.  Stable ischemic and toxic/metabolic injuries are described above. Electronically Signed   By: Monte Fantasia M.D.   On: 01/08/2015 16:37   Medications: I have reviewed the patient's current medications. Scheduled Meds: . aspirin EC  325 mg Oral Daily  . baclofen  10 mg Oral BID  . ciprofloxacin  500 mg Oral BID  . enoxaparin (LOVENOX) injection  40 mg Subcutaneous Q24H  . feeding supplement (ENSURE ENLIVE)  237 mL Oral BID BM  . gabapentin  100 mg Oral TID  . hydrochlorothiazide  25 mg Oral Daily  . metoprolol  100 mg Oral BID  . mirtazapine  7.5 mg Oral QHS  . senna  1 tablet Oral Daily  . simvastatin  20 mg Oral q1800   Continuous Infusions:  PRN Meds:.acetaminophen, polyethylene glycol, polyethylene glycol powder Assessment/Plan: Principal Problem:   Aphasia complicating stroke Active Problems:   DM (diabetes mellitus) type II controlled, neurological manifestation (HCC)   Facial droop due to stroke   Dysphagia   Malnutrition of moderate degree (HCC)   Urine leukocytes   Expressive aphasia  [name] is a 54 yo female with past left MCA CVA with residual right sided  weakness, facial droop and expressive aphasia, DMII, HTN, and schizoaffective disorder, presenting with concern for new stroke.   Right Facial Droop and h/o CVA: Family concerned about patient's worsened right facial droop and aphasia. Past clinic notes demonstrate consistent expressive aphasia and facial droop. Unclear if droop is worsened, but rest of her neurologic exam is consistent with previous. Other than stroke, patient may be delirious 2/2 UTI. Will obtain MRI to r/o stroke with appropriate workup if positive. However, patient does not appear to have had acute stroke at this time.  MRI brain - Neuro following, appreciate rec's - [place] - PT/OT/SLP - Baclofen - Gabapentin - Simvastatin - ASA  UTI: Patient has had recurrent UTIs recently, treated with Cipro. Past urine culture did not grow  anything. Currently being treated with CTX in ED. Patient received CTX in ED and will transition to PO tomorrow. Will also treat patient's large stool burden. - Cipro 500 mg BID - Fleet enema - Miralax  DMII: Diet controlled. A1c 5.7% in August. - CTM  HTN: Permissive hypertension for now.  - Hold Amlodipine, Metoprolol, HCTZ  Decreased Appetite: Remeron and Ensure  FEN/GI: - NPO  DVT Ppx: Lovenox   Dispo: Disposition is deferred at this time, awaiting improvement of current medical problems.  Anticipated discharge in approximately 1 day(s).   The patient does have a current PCP Lance Bosch, NP) and does need an [facility] hospital follow-up appointment after discharge.  The patient does not have transportation limitations that hinder transportation to clinic appointments.  .Services Needed at time of discharge: Y = Yes, Blank = No PT:   OT:   RN:   Equipment:   Other:     LOS: 1 day   Brenda Oven, MD 01/09/2015, 9:55 AM
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
## 2015-01-09 NOTE — Discharge Instructions (Signed)
1. Continue to take your medications as prescribed. 2. Make an appointment with your PCP, Chari Manning, for follow up after discharge.   Aphasia Aphasia is damage to the part of your brain that you need to communicate. For most people, that area is on the left side of the brain. Aphasia does not affect your intelligence, but you may struggle to talk, understand speech, read, or write. Aphasia can happen to anyone at any age, but it is most common in older age. CAUSES  An interruption of blood supply to the brain (stroke) is the most common cause of aphasia. Any disease or disorder that damages the communication areas of the brain can cause aphasia. This includes:   Brain tumors.  Brain injuries.  Brain infections.  Progressive diseases of the nervous system (neurological disorders). RISK FACTORS You may be at risk for aphasia if you have had any trauma, disease, or disorder that damaged the communication areas of the brain. SIGNS AND SYMPTOMS  Aphasia may start suddenly if it is caused by a stroke or brain injury. Aphasia caused by a tumor or a progressive neurological disorder may start gradually. The condition affects people differently. Signs and symptoms of aphasia include:  Trouble finding the right word.  Using the wrong words.  Talking in sentences that do not make sense.  Making up words.  Being unable to understand other people's speech.  Having problems writing, spelling, or reading.  Having trouble with numbers.  Having trouble swallowing. DIAGNOSIS  Your health care provider may suspect you have aphasia if you lose the ability to speak or understand language. You may need to see a specialist (speech and language pathologist) to help determine the diagnosis of aphasia. This person may do a series of tests to check your ability to:  Speak.  Express ideas.  Make conversation.  Understand speech.  Read and write. TREATMENT  In some cases, aphasia may improve  on its own over time. Treatment for aphasia usually involves therapy with a pathologist. Your treatment will be designed to meet your needs and abilities. Common treatments include:  Speech therapy.  Learning other ways to communicate.  Working with family members to find the best ways to communicate.  Working with an occupational therapist to find ways to communicate at work. HOME CARE INSTRUCTIONS  Keep all follow-up appointments.  Make sure you have a good support system at home.  The following techniques may be helpful while communicating:  Use short, simple sentences. Ask family members to do the same. Sentences that require one-word answers are easiest.  Avoid distractions like background noise when trying to listen or talk.  Try communicating with gestures, pointing, or drawing.  Talk slowly. Ask family members to talk to you slowly.  Maintain eye contact when communicating. SEEK MEDICAL CARE IF:  Your symptoms change or get worse.  You need more support at home.  You are struggling with anxiety or depression.  You develop trouble swallowing.   This information is not intended to replace advice given to you by your health care provider. Make sure you discuss any questions you have with your health care provider.   Document Released: 11/26/2001 Document Revised: 03/27/2014 Document Reviewed: 05/26/2013 Elsevier Interactive Patient Education [employer].

## 2015-01-09 NOTE — Care Management Note (Signed)
Case Management Note  Patient Details  Name: [name] MRN: [id number] Date of Birth: 1960-11-18  Subjective/Objective:                   expressive aphasia. Action/Plan:  Discharge planning Expected Discharge Date:  01/09/15               Expected Discharge Plan:  [name]  In-House Referral:     Discharge planning Services  CM Consult  Post Acute Care Choice:  Home Health Choice offered to:  Adult Children, Patient  DME Arranged:  Shower stool DME Agency:  [facility] Arranged:  PT, OT, RN, Nurse's Aide, Speech Therapy HH Agency:  [facility]  Status of Service:  Completed, signed off  Medicare Important Message Given:    Date Medicare IM Given:    Medicare IM give by:    Date Additional Medicare IM Given:    Additional Medicare Important Message give by:     If discussed at [place] of Stay Meetings, dates discussed:    Additional Comments: CM met with pt and pt's daughter, Radene Journey [phone], in room to offer choice of home health agency.  Pt has aphasia but communicates with nods and facial expression.  Daughter, Radene Journey is primary contact for scheduling.  [name] chooses [facility] to render HHPT/OT/RN/SLP/Aide.  Referral called to [facility] rep, Tiffany.  CM called [facility] DME rep, Merry Proud to please deliver the shower stool prior to discharge today.  No other CM needs were communicated. Dellie Catholic, RN 01/09/2015, 11:05 AM

## 2015-01-11 NOTE — Therapy (Signed)
[facility] [address] [place], [place], [address] Phone: [phone]   Fax:  [phone]  Patient Details  Name: AMIRACLE NEISES MRN: [id number] Date of Birth: Jul 18, 1960 Referring Provider:  Rosalin Hawking, MD  Encounter Date: 01/26/2014 OCCUPATIONAL THERAPY DISCHARGE SUMMARY  Visits from Start of Care: 1  Current functional level related to goals / functional outcomes:  Patient did not return for subsequent OT visits    Remaining deficits: Patient did not return for subsequent OT visits    Education / Equipment: See evaluation Plan:                                                    Patient goals were not met. Patient is being discharged due to not returning since the last visit.  ?????       Mariah Milling, OTR/L 01/11/2015, 1:10 PM  [facility] [address] [place], [place], [address] Phone: [phone]   Fax:  [phone]

## 2015-01-15 ENCOUNTER — Ambulatory Visit: Payer: Medicaid Other | Admitting: Physical Medicine & Rehabilitation

## 2015-01-15 ENCOUNTER — Telehealth: Payer: Self-pay | Admitting: Internal Medicine

## 2015-01-15 NOTE — Telephone Encounter (Signed)
Amy a nurse with [facility] is calling on behalf of the patient to report that she has had few days of low BP and her daughter can tell that the patient seems tired and weak. The patient is taking three medications that lower her blood pressure would like some parameters or suggestions as to if it is OK to cut some of the dosages in half. Please advise at the earliest convenience. Thank you, Fonda Kinder, ASA

## 2015-01-18 ENCOUNTER — Ambulatory Visit: Payer: Medicaid Other | Admitting: Neurology

## 2015-01-19 ENCOUNTER — Ambulatory Visit: Payer: Medicaid Other | Attending: Internal Medicine | Admitting: Internal Medicine

## 2015-01-19 ENCOUNTER — Encounter: Payer: Self-pay | Admitting: Internal Medicine

## 2015-01-19 ENCOUNTER — Telehealth: Payer: Self-pay

## 2015-01-19 ENCOUNTER — Other Ambulatory Visit: Payer: Self-pay | Admitting: Internal Medicine

## 2015-01-19 VITALS — BP 126/77 | HR 59 | Temp 98.0°F | Resp 16

## 2015-01-19 DIAGNOSIS — E119 Type 2 diabetes mellitus without complications: Secondary | ICD-10-CM | POA: Diagnosis not present

## 2015-01-19 DIAGNOSIS — I10 Essential (primary) hypertension: Secondary | ICD-10-CM | POA: Insufficient documentation

## 2015-01-19 DIAGNOSIS — F32A Depression, unspecified: Secondary | ICD-10-CM

## 2015-01-19 DIAGNOSIS — F329 Major depressive disorder, single episode, unspecified: Secondary | ICD-10-CM | POA: Diagnosis not present

## 2015-01-19 DIAGNOSIS — E46 Unspecified protein-calorie malnutrition: Secondary | ICD-10-CM | POA: Diagnosis not present

## 2015-01-19 LAB — POCT GLYCOSYLATED HEMOGLOBIN (HGB A1C): Hemoglobin A1C: 5.5

## 2015-01-19 LAB — GLUCOSE, POCT (MANUAL RESULT ENTRY): POC Glucose: 70 mg/dl (ref 70–99)

## 2015-01-19 NOTE — Progress Notes (Signed)
Patient ID: [name], female   DOB: 1960/05/12, 54 y.o.   MRN: [id number]  CC: HFU  HPI: [name] is a 54 y.o. female here today for a follow up visit.  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 Patient is present with daughter [name] who is her primary caregiver. Daughter reports that patient was recentlky in the hospital for UTI and was discharged with antibiotics. She is currently on her last day of medications and has had improvement in symptoms. Daughter is concerned because soon after her discharge her BP dropped to 92/62 adn then 102/60 the next day. Daughter has since been checking her pressures daily with ranges of 110-130/65-80.  Daughter reports that she feels like her mother is giving up because she refuses to eat some days and no longer wants help from her daughter with medications or hygiene. Daughter has a Optician, dispensing who comes to help her mother weekly but states that the technician was always on the phone and barely paid attention to her mother. Daughter gives patient 2-3 protein shakes per day because mother refuses to drink Glucerna shakes again. Patient has yet to be seen by psychiatry because she was hospitalized during the date of her scheduled visit. Daughter also states that she did not give her mother the prescribed Remeron because she was afraid of side effects.  No Known Allergies Past Medical History  Diagnosis Date  . Hypertension   . Depression   . Diabetes mellitus ([place])     ?type (05/29/2013)  . Bipolar affective disorder ([place])   . Schizophrenia ([place])     [name] 11/28/2007 (05/29/2013)  . Stroke [facility]) 05/27/2013   Current Outpatient Prescriptions on File Prior to Visit  Medication Sig Dispense Refill  . acetaminophen (TYLENOL) 325 MG tablet Take 1-2 tablets (325-650 mg total) by mouth every 4 (four) hours as needed for mild pain (available over the  counter).    Marland Kitchen aspirin 325 MG tablet Take 325 mg by mouth daily.    . feeding supplement, ENSURE ENLIVE, (ENSURE ENLIVE) LIQD Take 237 mLs by mouth 2 (two) times daily between meals.    . gabapentin (NEURONTIN) 100 MG capsule Take 1 capsule (100 mg total) by mouth 3 (three) times daily. 90 capsule 3  . glycerin adult (GLYCERIN ADULT) 2 G SUPP Place 1 suppository rectally once as needed for moderate constipation. 10 suppository 0  . hydrochlorothiazide (HYDRODIURIL) 25 MG tablet Take 1 tablet (25 mg total) by mouth daily. 30 tablet 2  . Insulin Glargine (LANTUS SOLOSTAR) 100 UNIT/ML Solostar Pen Inject 10 Units into the skin daily at 10 pm. Once DAILY 5 pen 11  . metoprolol (LOPRESSOR) 100 MG tablet Take 1 tablet (100 mg total) by mouth 2 (two) times daily.    . mirtazapine (REMERON) 7.5 MG tablet Take 1 tablet (7.5 mg total) by mouth at bedtime. 30 tablet 1  . polyethylene glycol powder (GLYCOLAX/MIRALAX) powder Take 17 g by mouth daily. (Patient taking differently: Take 17 g by mouth daily as needed (constipation). ) 527 g 3  . senna (SENNA LAX) 8.6 MG tablet Take 1 tablet (8.6 mg total) by mouth daily.    . simvastatin (ZOCOR) 20 MG tablet TAKE 1 TABLET BY MOUTH DAILY AT 6 PM 30 tablet 2  . baclofen (LIORESAL) 10 MG tablet Take 1 tablet (10 mg total) by mouth 2 (two) times daily. To help with tone/spasms 60 tablet 1  .  ciprofloxacin (CIPRO) 500 MG tablet Take 1 tablet (500 mg total) by mouth 2 (two) times daily. 14 tablet 0   No current facility-administered medications on file prior to visit.   Family History  Problem Relation Age of Onset  . Hypertension Mother   . Stroke Father   . Hypertension Father   . Diabetes Father    Social History   Social History  . Marital Status: Single    Spouse Name: N/A  . Number of Children: 3  . Years of Education: 12   Occupational History  . disabled    Social History Main Topics  . Smoking status: Never Smoker   . Smokeless tobacco: Never  Used  . Alcohol Use: No  . Drug Use: No  . Sexual Activity: Not on file   Other Topics Concern  . Not on file   Social History Narrative   Patient is single with 3 children.   Patient is right handed.   Patient has hs education.   Patient does not drink caffeine.    Review of Systems: Unable to perform due to aphasia   Objective:   Filed Vitals:   01/19/15 1620  BP: 146/90  Pulse: 59  Temp: 98 F (36.7 C)  Resp: 16    Physical Exam  Constitutional: She is oriented to person, place, and time.  Malnourished, underweight  Cardiovascular: Normal rate, regular rhythm and normal heart sounds.   Pulmonary/Chest: Effort normal and breath sounds normal.  Abdominal: Soft. Bowel sounds are normal. There is no tenderness.  Neurological: She is alert and oriented to person, place, and time.  Facial droo Only says "yes", "No", and "thank you"  Skin: Skin is warm and dry.     Lab Results  Component Value Date   WBC 6.3 01/08/2015   HGB 14.6 01/08/2015   HCT 43.0 01/08/2015   MCV 89.7 01/08/2015   PLT 192 01/08/2015   Lab Results  Component Value Date   CREATININE 0.80 01/08/2015   BUN 16 01/08/2015   NA 142 01/08/2015   K 4.6 01/08/2015   CL 101 01/08/2015   CO2 30 01/08/2015    Lab Results  Component Value Date   HGBA1C 5.50 01/19/2015   Lipid Panel     Component Value Date/Time   CHOL 105 12/06/2013 0345   TRIG 66 12/06/2013 0345   HDL 42 12/06/2013 0345   CHOLHDL 2.5 12/06/2013 0345   VLDL 13 12/06/2013 0345   LDLCALC 50 12/06/2013 0345       Assessment and plan:   [place] was seen today for follow-up.  Diagnoses and all orders for this visit:  Essential hypertension I have reviewed patients BP log and all other pressures have been normal. If pressure is less than 093 systolic then the daughter may hold morning medications and recheck around lunch. She is stable at this time  Malnourished [facility]) I have asked daughter to continue meal  supplement drinks to help with caloric intake I have asked LCSW to step in and find additional resources for patient and her daughter. They are currently hoping to get patient into hospice palliative care for respite care or extra assistance.   Type 2 diabetes mellitus without complication, without long-term current use of insulin (HCC) -     Glucose (CBG) -     HgB A1c Patients diabetes is well control as evidence by consistently low a1c.  Patient will continue with current therapy and continue to make necessary lifestyle changes.  Reviewed  foot care, diet, exercise, annual health maintenance with patient.   Depressed I have given patients daughter the number to psychiatry to call and reschedule appointment.   Total time spent with patient was 25 minutes. > 50% spent counseling and coordination care with patient.   Return in about 6 months (around 07/19/2015) for DM/HTN.       Lance Bosch, [facility] [phone] 01/19/2015, 4:39 PM

## 2015-01-19 NOTE — Progress Notes (Signed)
Patient here for follow up from the hospital Patient was admitted over night with a UTI Daughter is concerned that her blood pressures have been running low at night

## 2015-01-19 NOTE — Telephone Encounter (Signed)
Returned phone call to Amy at [facility] @[phone] Amy not available Left message on voice mail to return our call

## 2015-01-19 NOTE — Patient Instructions (Signed)
Sent referral to Dr Corena [facility]  Ph. [phone]  Address [place] waiting for an appointment

## 2015-01-20 ENCOUNTER — Telehealth: Payer: Self-pay

## 2015-01-20 NOTE — Telephone Encounter (Addendum)
Request from Chari Manning, NP for possible community resources available for the patient and her daughter, Demitri, who is her primary caregiver.  Call placed to [facility] # [phone] and spoke to [facility] who confirmed that the patient is receiving the following services:  Skilled Nursing - at this time- every other week and as needed until 03/10/15  P.T. - 1-2 visits/week until 02/03/15  O.T. - 1-2 visits/week until 01/26/18  S.T. - last visit will be 02/16/15. She did not have the frequency available.   This CM requested a call back from the patient's nurse, Amy Angrave. Antionette stated that she would give her the message.  Call placed to Heath Lark - Liaison with [facility]. Voice mail message was left and she returned the call.  She confirmed that they are actively involved with the patient.  She noted that a home visit was done by Quintin Alto, LPN and Jocelyn Lamer, nurse on 12/29/14. She noted that the daughter reported that they do not need any DME, they obtain the medications from [facility] and they have no transportation issues.  Flossie Buffy said that the nutritionist tried to contact the patient's daughter on 01/18/15 and left messages twice requesting a return call ad her daughter has not called back.  She also noted that they mailed the patient an application for financial assistance with obtaining the [employer].  She also noted that the daughter has been given information about the [employer].  A call was placed to Quintin Alto, [place] with [facility] # [phone] and a voice mail message was left requesting a call back  to # [phone] or [phone].  A call was placed to [facility] # [phone] requesting information about their palliative care program. A voice mail message was left for Ruthy Dick requesting a call back to # [phone] or [phone]. A call was then received from [facility]. She explained that the palliative care  program consists of Josh, NP making a consultative home visit to address the patient and family/caregiver concerns about the progression of the disease process and discuss advanced care planning.  He can also make recommendation regarding medications,  therapy and home health care but they do not not provide any care.   They also have a SW that can provide resource information and the patient and caregiver.  A referral, including H&P can be faxed to # [phone].    It was noted in the patient's medical record that a referral had been made to [facility] [phone] in May 2016.  A call was then placed to the agency and this CM spoke to [facility], [facility], who stated that they received the referral from [facility].  He noted that at the time he spoke  with the patient and her daughter to discuss the hospice program, the patient was looking for aggressive /restorative therapy and the hospice program did not meet their needs.  He noted that he would re-evaluate her for the program if the patient and her daughter now have different thoughts about the hospice program.  He noted that their program would consist of RN/LPN visits as needed in addition to home health aide services possible 5 days /week for approximately 1.5-2 hours/visit.  He stated that they can provide some P.T.at home but it is not aggressive therapy.  He noted that they also have inpatient respite care available and volunteer sitters available to help provide intermittent respite at home.  Cory's contact # [phone].

## 2015-01-22 ENCOUNTER — Encounter: Payer: Medicaid Other | Attending: Physical Medicine & Rehabilitation

## 2015-01-22 ENCOUNTER — Encounter: Payer: Self-pay | Admitting: Physical Medicine & Rehabilitation

## 2015-01-22 ENCOUNTER — Ambulatory Visit (HOSPITAL_BASED_OUTPATIENT_CLINIC_OR_DEPARTMENT_OTHER): Payer: Medicaid Other | Admitting: Physical Medicine & Rehabilitation

## 2015-01-22 VITALS — BP 108/74 | HR 78 | Resp 14

## 2015-01-22 DIAGNOSIS — I639 Cerebral infarction, unspecified: Secondary | ICD-10-CM | POA: Diagnosis not present

## 2015-01-22 DIAGNOSIS — F209 Schizophrenia, unspecified: Secondary | ICD-10-CM | POA: Diagnosis not present

## 2015-01-22 DIAGNOSIS — M7501 Adhesive capsulitis of right shoulder: Secondary | ICD-10-CM | POA: Diagnosis not present

## 2015-01-22 DIAGNOSIS — G8110 Spastic hemiplegia affecting unspecified side: Secondary | ICD-10-CM | POA: Diagnosis not present

## 2015-01-22 DIAGNOSIS — F319 Bipolar disorder, unspecified: Secondary | ICD-10-CM | POA: Insufficient documentation

## 2015-01-22 DIAGNOSIS — G8190 Hemiplegia, unspecified affecting unspecified side: Secondary | ICD-10-CM | POA: Insufficient documentation

## 2015-01-22 DIAGNOSIS — I69320 Aphasia following cerebral infarction: Secondary | ICD-10-CM | POA: Insufficient documentation

## 2015-01-22 DIAGNOSIS — I10 Essential (primary) hypertension: Secondary | ICD-10-CM | POA: Diagnosis not present

## 2015-01-22 DIAGNOSIS — R482 Apraxia: Secondary | ICD-10-CM | POA: Insufficient documentation

## 2015-01-22 DIAGNOSIS — F329 Major depressive disorder, single episode, unspecified: Secondary | ICD-10-CM | POA: Insufficient documentation

## 2015-01-22 DIAGNOSIS — E119 Type 2 diabetes mellitus without complications: Secondary | ICD-10-CM | POA: Insufficient documentation

## 2015-01-22 NOTE — Progress Notes (Signed)
Subjective:    Patient ID: [name], female    DOB: 28-Mar-1960, 54 y.o.   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 She completed inpatient rehabilitation at [facility] followed by home health therapy as well as outpatient therapy. Hospitalized in August for increased swallowing problems but no new stroke. Went to skilled nursing facility briefly but then discharged to home because of a bad experience.More recently the patient has been hospitalized for UTI. She has been receiving home health therapy   Pain Inventory Average Pain 6 Pain Right Now 5 My pain is sharp, stabbing, tingling and aching  In the last 24 hours, has pain interfered with the following? General activity 6 Relation with others 6 Enjoyment of life 6 What TIME of day is your pain at its worst? morning, evening  Sleep (in general) Fair  Pain is worse with: walking, bending, sitting, inactivity and standing Pain improves with: rest and medication Relief from Meds: 4  Mobility walk with assistance use a walker ability to climb steps?  no do you drive?  no use a wheelchair needs help with transfers Do you have any goals in this area?  yes  Function disabled: date disabled . I need assistance with the following:  feeding, dressing, bathing, toileting, meal prep, household duties and shopping Do you have any goals in this area?  yes  Neuro/Psych weakness numbness tingling trouble walking spasms confusion  Prior Studies Any changes since last visit?  no  Physicians involved in your care Any changes since last visit?  no   Family History  Problem Relation Age of Onset  . Hypertension Mother   . Stroke Father   . Hypertension Father   . Diabetes Father    Social History   Social History  . Marital Status: Single    Spouse Name: N/A  . Number of Children: 3  . Years of Education:  12   Occupational History  . disabled    Social History Main Topics  . Smoking status: Never Smoker   . Smokeless tobacco: Never Used  . Alcohol Use: No  . Drug Use: No  . Sexual Activity: Not Asked   Other Topics Concern  . None   Social History Narrative   Patient is single with 3 children.   Patient is right handed.   Patient has hs education.   Patient does not drink caffeine.   Past Surgical History  Procedure Laterality Date  . Cesarean section  ~ 1987  . Tubal ligation    . Tee without cardioversion N/A 12/08/2013    Procedure: TRANSESOPHAGEAL ECHOCARDIOGRAM (TEE);  Surgeon: Candee Furbish, MD;  Location: [facility] END[facility]COPY;  Service: Cardiovascular;  Laterality: N/A;   Past Medical History  Diagnosis Date  . Hypertension   . Depression   . Diabetes mellitus ([place])     ?type (05/29/2013)  . Bipolar affective disorder ([place])   . Schizophrenia ([place])     [name] 11/28/2007 (05/29/2013)  . Stroke ([place]) 05/27/2013   BP 108/74 mmHg  Pulse 78  Resp 14  SpO2 100%  Opioid Risk Score:   Fall Risk Score:  `1  Depression screen PHQ 2/9  Depression screen [facility] 2/9 12/11/2014 12/11/2014 10/20/2014 06/04/2014 08/22/2013 08/22/2013  Decreased Interest 1 3 (No Data) 1 0 0  Down, Depressed, Hopeless 2 3 (No Data) 1 0 0  PHQ - 2 Score 3 6 -  2 0 0  Altered sleeping 2 - - 1 - -  Tired, decreased energy 1 - - 2 - -  Change in appetite 1 - - 2 - -  Feeling bad or failure about yourself  0 - - 0 - -  Trouble concentrating 1 - - 2 - -  Moving slowly or fidgety/restless 1 - - 0 - -  Suicidal thoughts 0 - - 0 - -  PHQ-9 Score 9 - - 9 - -  Difficult doing work/chores Somewhat difficult - - - - -     Review of Systems  Constitutional: Positive for appetite change.  Respiratory: Positive for cough.   Musculoskeletal: Positive for gait problem.       Spasms   Neurological: Positive for weakness and numbness.       Tingling  All other systems reviewed and are negative.      Objective:    Physical Exam 0/5 right upper extremity Modified Ashworth score of 3 at the finger flexors and thumb flexor of the right hand. 3 minus right hip flexor, 2 minus right knee extensor, trace right ankle plantar flexor Sensation unable to obtain due to aphasia  Patient with ideomotor apraxia difficulty following commands. Right shoulder contracture pain with external rotation as well as subluxation.      Assessment & Plan:   1. 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 Patient has had 2 hospitalizations In the last 2 months, both did not revealNew strokes. The last one demonstrated UTI. I discussed with the patient as well as her daughter that I do not expect any further improvement from her stroke symptoms.  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 Spasticity right finger flexors and finger flexors. She has gone through inpatient and outpatient rehabilitation. Patient would benefit from botulinum toxin injection 200 units FPL 25 units FDS 25units FDP 25 units FCU 25 units FCR 25 units Biceps brachii 75 units

## 2015-01-22 NOTE — Patient Instructions (Addendum)
Left hand pain may be carpal tunnel, we can do a nerve study to check  Right hand forearm Botox injection next month  May switch to outpatient therapy next month

## 2015-01-26 ENCOUNTER — Telehealth: Payer: Self-pay

## 2015-01-26 NOTE — Telephone Encounter (Signed)
Call placed to the patient's daughter, [name].  Discussed the current plan for home care services for her mother. She noted that the services with [facility] are being re-evaluated and the end date for the therapies has not been determined yet.    She said that the [facility] has not found a replacement aide yet, so the agency director has been coming to provide the personal care for her mother and her mother is very comfortable with this person. [name] said that the director will come to provide the care until a good replacement can be found. She said that the director was at the house from [place] today. During the PCS time, [name] said that she can go to her own medical appointments or do errands.   Explained to [name] that this CM has made multiple calls to determine the extent of services that her mother has been receiving as well as what referrals have been made in the past.  Discussed the support provide by [facility].  [name] said that she received a call from Quintin Alto, nurse with [facility] and she needs to call her back. [name] also said that she thinks that she received the application for financial assistance with the ensure.    Explained that this CM noted  that a referral had been made to [facility] in the past and discussed the types of services and support that hospice can provide for both [name] and her mother, including respite care.  [name] recalled the hospice meeting and stated that she is not interested in hospice care for her mother at this time.  This CM explained that her mother could not continue to receive the restorative therapy that she is currently receiving and [name] said that she is not interested in hospice at this time. She said that her mother " needs therapy" and she wants to continue with the current plan.  She said that she is going to take off from school until next fall and she hopes that she will have a plan in place for care for her mother that  will allow her to attend school and focus on her studies.  She said that she would also like to be able to go out and socialize with her friends. Discussed with her the need to contact her Sherilyn Banker at her church to further discuss volunteers to stay with her mother so that she could have some time for herself. She said that she has contacted her Sherilyn Banker again and is waiting to hear back from him.  She noted that he said he wants to make sure that he gets the right people who can provide assistance. Discussed that the church can potentially provide some needed support.   Also discussed the option of requesting additional PCS hours. Demitiri stated that she would like to try to request more hours but understands that there is no guarantee of an increase in hours.   [name] was very appreciative of the call.   CM to follow up with Chari Manning, NP regarding the request for additional PCS hours.

## 2015-02-01 ENCOUNTER — Telehealth: Payer: Self-pay

## 2015-02-01 ENCOUNTER — Telehealth: Payer: Self-pay | Admitting: Internal Medicine

## 2015-02-01 NOTE — Telephone Encounter (Signed)
Returned call to [employer] her mom has not had a bowel movement in two weeks Last time she had a bowel movement it was dark and tarry Has been giving her senokot stool softener and mirilax with no relief Per provider patient will need to be seen

## 2015-02-01 NOTE — Telephone Encounter (Signed)
Amy, the nurse from [place] for the patient, is calling because the patient is having black stools, possibly from recent issues with constipation, she is asking permission to advise patient to increase miralax dose. Please follow up at the earliest convenience. Thank you, Fonda Kinder, ASA

## 2015-02-02 ENCOUNTER — Ambulatory Visit (INDEPENDENT_AMBULATORY_CARE_PROVIDER_SITE_OTHER): Payer: Medicaid Other | Admitting: Neurology

## 2015-02-02 ENCOUNTER — Encounter: Payer: Self-pay | Admitting: Neurology

## 2015-02-02 ENCOUNTER — Telehealth: Payer: Self-pay

## 2015-02-02 VITALS — BP 126/79 | HR 56 | Ht 65.0 in | Wt 107.4 lb

## 2015-02-02 DIAGNOSIS — I10 Essential (primary) hypertension: Secondary | ICD-10-CM | POA: Diagnosis not present

## 2015-02-02 DIAGNOSIS — E1149 Type 2 diabetes mellitus with other diabetic neurological complication: Secondary | ICD-10-CM

## 2015-02-02 DIAGNOSIS — R4701 Aphasia: Secondary | ICD-10-CM

## 2015-02-02 DIAGNOSIS — I63512 Cerebral infarction due to unspecified occlusion or stenosis of left middle cerebral artery: Secondary | ICD-10-CM | POA: Diagnosis not present

## 2015-02-02 DIAGNOSIS — G8110 Spastic hemiplegia affecting unspecified side: Secondary | ICD-10-CM

## 2015-02-02 NOTE — Telephone Encounter (Signed)
Returned phone call to Warren Lacy [phone] Amy is aware that the patient is coming  In for an office visit  Tomorrow and that we will address her constipation issue at that visit

## 2015-02-02 NOTE — Progress Notes (Signed)
PATIENT: [name] DOB: [birth year]  REASON FOR VISIT: Hospital follow up for stroke HISTORY FROM: daughter and patient  HISTORY OF PRESENT ILLNESS: 54 y.o. [name] female who comes to the office for first hospital follow up post hospital discharge for stroke. She has a history of HTN, DM, bipolar disorder, who was admitted on 05/27/13 with right facial droop and aphasia. 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 Infarcts felt to be embolic due to unknown source and she was placed on ASA for secondary stroke prevention.  Patient had progressive neurologic worsening on 05/29/13 pm with decrease in LOC and right sided weakness. EEG negative for seizures and CCT without significant changes; felt patient with progression of symptoms due to relative hypotension and recommended allowing significantly high permissive hypertension in this patient. Hgb A1c was 14.1 in hospital, now 5.9.  LDL was 130. Hypercoagulable workup negative. 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 No other intracranial flow-limiting stenosis or embolic disease evident. Areas of low density in the left MCA territory consistent with infarction. 2D Echocardiogram with EF of 55-60% with no source of embolus. TEE was not done due to hypotension in hospital. Patient with resultant right hemiparesis and aphasia.  Has had PEG tube removed by radiology in June. Eating well and drinking well. Remains aphasic. Contemplating Botox injections to the wrist and finger flexors, hamstrings, and gastrocnemius by Dr.  Letta Pate. Using Baclofen for spasticity and gabapentin for pain. 71 year old daughter is providing all care.   01/05/14 follow up - she still takes full dose ASA and zocor for stroke prevention. Follows with PCP for BP and DM. RLE muscle strength improved but RUE still flaccid. Remains aphasic with only "Thank you" and "yes/no". She had inpt rehab followed by home PT/OT but then discharged from home PT/OT but no outpt PT/OT initiated yet. Daughter is going to check the status of outpt PT/OT.  04/02/14 follow up - patient has been doing the same. Still has significant aphasia and RUE weakness, but she was able to walk with semi-walker now. She follows with Dr. Read Drivers and will start PT and botox injection again. Her loop recorder never put in, will have to refer her again. She went to ER on 03/17/14 for episode of holding head, do not respond on commands, keep sticking in and out of her tougue for about 5 min, no shaking jerking or LOC. Daughter concerned and pt sent to ER, repeat CT no acute changes and pt back to baseline.   07/09/14 follow up - patient has been doing the same. She was referred to Dr. Rayann Heman for loop recorder placement, however she refused again. Blood pressure 122/82 in clinic today, she still has significant aphasia and right sided weakness. As per daughter, patient recently started to have staring spells with drooling at the right mouth, no shaking jerking, tongue biting, b/b incontinence, lasting several minutes.  Interval history During the interval time, pt has been doing the same. Had EEG in office which was negative for seizure. I offered home EEG long term monitoring but not done yet and I think daughter declined  that test. Daughter also declined seizure medication at that time. However, as per daughter, pt has no more episodes like before. She continued follow up with Dr. Read Drivers and will have botox for spasticity. She still has PT/OT to work with her.   REVIEW OF SYSTEMS:  Full 14 system review of systems performed and notable only for: eye itching, ringing in ears, runny nose, light sensitivity, eye pain, leg swelling, cold intolerance, excessive thirst, constipation, frequent waking, joint pain, aching muscles, walking difficulty, speech difficulty, weakness, agitation and confusion.  ALLERGIES: No Known Allergies  HOME MEDICATIONS: Outpatient Prescriptions Prior to Visit  Medication Sig Dispense Refill  . acetaminophen (TYLENOL) 325 MG tablet Take 1-2 tablets (325-650 mg total) by mouth every 4 (four) hours as needed for mild pain (available over the counter).    Marland Kitchen aspirin 325 MG tablet Take 325 mg by mouth daily.    . baclofen (LIORESAL) 10 MG tablet Take 1 tablet (10 mg total) by mouth 2 (two) times daily. To help with tone/spasms 60 tablet 1  . ciprofloxacin (CIPRO) 500 MG tablet Take 1 tablet (500 mg total) by mouth 2 (two) times daily. 14 tablet 0  . feeding supplement, ENSURE ENLIVE, (ENSURE ENLIVE) LIQD Take 237 mLs by mouth 2 (two) times daily between meals.    . gabapentin (NEURONTIN) 100 MG capsule Take 1 capsule (100 mg total) by mouth 3 (three) times daily. 90 capsule 3  . glycerin adult (GLYCERIN ADULT) 2 G SUPP Place 1 suppository rectally once as needed for moderate constipation. 10 suppository 0  . hydrochlorothiazide (HYDRODIURIL) 25 MG tablet Take 1 tablet (25 mg total) by mouth daily. 30 tablet 2  . Insulin Glargine (LANTUS SOLOSTAR) 100 UNIT/ML Solostar Pen Inject 10 Units into the skin daily at 10 pm. Once DAILY 5 pen 11  . metoprolol (LOPRESSOR) 100 MG tablet TAKE 1 TABLET BY MOUTH THREE TIMES DAILY 90 tablet 1  . polyethylene glycol powder (GLYCOLAX/MIRALAX) powder Take 17 g by mouth daily. (Patient taking differently: Take 17 g by mouth daily as needed (constipation). ) 527 g 3  . senna (SENNA LAX) 8.6 MG tablet Take 1 tablet (8.6 mg total) by mouth daily.    . simvastatin (ZOCOR) 20 MG tablet TAKE 1 TABLET BY MOUTH DAILY AT 6 PM 30  tablet 1   No facility-administered medications prior to visit.    PHYSICAL EXAM Filed Vitals:   02/02/15 1538  BP: 126/79  Pulse: 56  Height: 5\' 5"  (1.651 m)  Weight: 107 lb 6.4 oz (48.716 kg)   Body mass index is 17.87 kg/(m^2). No exam data present  Generalized: Well developed, in no acute distress  Head: normocephalic and atraumatic. Oropharynx benign  Neck: Supple, no carotid bruits  Cardiac: Regular rate rhythm, no murmur   Neurological examination  Mentation: Orientation unable to assess due to aphasia.  Severe expressive aphasia with only "Thank you" and no sentences. Able to comprehend 1-2 simple central commands, but not peripheral commands. Unable to name or repeat.  Cranial nerve II-XII: Fundoscopic exam not able to see through. Pupils were equal round reactive to light, extraocular movements were full.  Facial droop on the right, normal on the left. Hearing was intact to finger rubbing bilaterally. Tongue in middle. Head turning and shoulder shrug are asymmetric, impaired on right. Motor: Right upper extremity distal strength 0/5, proximal 2/5, right lower extremity strength is 4-/5 proxima and 3-/5 distal. Left upper and lower extremity strength 5/5. Increased tone on the right Sensory:  Sensory testing is intact to soft touch LUE and LLE. Evidence of extinction is noted on the right. Coordination: Cerebellar testing not able to do on the right. Gait and station: Gait is not tested, in wheelchair Reflexes: Deep tendon reflexes are symmetric and decreased bilaterally.   I have personally reviewed the radiological images below and agree with the radiology interpretations.  EEG 05/2013 - This is an abnormal EEG secondary to slowing over the left hmeisphere with frequent left hemispheric sharp acitivty as well.. This finding is suggestive of a focal disturbance that is etiologically nonspecific, but may include a mass lesion or post-ictal focal slowing among other  possibilities.   CT of the brain 05/27/2013 No acute intracranial pathology.   CT brain 03/17/14 - No acute abnormality. 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  CTA head 05/2013- Severe stenosis of the left middle cerebral artery at the M1 segment with markedly diminished visualization of the more distal left MCA branches. No other intracranial flow-limiting stenosis or embolic disease evident. Areas of low density in the left MCA territory consistent with infarction. No hemorrhagic transformation. Question new low density in the pons, not definite.  MRI of the brain 05/27/2013 Multiple foci of acute ischemia within the left middle cerebral artery territory. In addition, however punctate focus of acute ischemia within the left occipital lobe and right thalamus (both of which would be posterior circulation). All areas of ischemia have similar acuity. No hemorrhagic conversion. Symmetric abnormal bright signal within the lenticulostriate nuclei (basal ganglia) in addition to abnormal temporoccipital periventricular T2 hyperintense signal. These findings may reflect sequelae of acute severe hypertension (PRES) but, it can also be seen with osmotic demyelination, autoimmune disorders such as scleroderma (which can have associated vasculopathy), less likely infectious encephalitis. Moderate to severe white matter changes, in a distribution which can be seen with demyelination, with superimposed possible chronic small vessel ischemic disease though, are nonspecific.   MRI brain 12/05/13 - No evidence of acute ischemia, specifically no acute LEFT sided stroke. Remote large LEFT middle cerebral artery territory hemorrhagic infarct. Similar abnormal signal within the basal ganglia and pons could reflect metabolic disorder, sequelae of possible PRES, less likely pontine/extra pontine myelinolysis, findings are similar. Remote bilateral thalamus lacunar infarcts. Moderate white  matter changes suggest chronic small vessel ischemic disease.  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 Mild luminal irregularity of the intracranial vessels which can be seen with intracranial atherosclerosis though, may also be seen with vasculopathy.   2D Echocardiogram EF 55-60% with no source of embolus.   Carotid Doppler No evidence of hemodynamically significant internal carotid artery stenosis. Vertebral artery flow is antegrade.   TCD emboli detection -  Negative  EEG 07/13/14 - This is a mildly abnormal EEG due to the presence of left hemispheric slowing and focal left frontotemporal irritability which may be compatible with patient's history of large left hemispheric infarct. No definite epileptiform activity is noted. Overall no significant change compared with EEG dated 05/31/14.  Component     Latest Ref Rng 05/28/2013 05/30/2013 08/22/2013 12/05/2013  PTT Lupus Anticoagulant     28.0 - 43.0 secs 37.9     PTTLA Confirmation     <8.0 secs NOT APPL     PTTLA 4:1 Mix     28.0 - 43.0 secs NOT APPL     DRVVT     <42.9 secs 35.1     Drvvt confirmation     <1.11 Ratio NOT APPL  dRVVT Incubated 1:1 Mix     <42.9 secs NOT APPL     Lupus Anticoagulant     NOT DETECTED NOT DETECTED     Cholesterol     0 - 200 mg/dL 201 (H)     Triglycerides     <150 mg/dL 145     HDL     >39 mg/dL 42     Total CHOL/HDL Ratio      4.8     VLDL     0 - 40 mg/dL 29     LDL (calc)     0 - 99 mg/dL 130 (H)     Anticardiolipin IgG     <23 GPL U/mL 5 (L)     Anticardiolipin IgM     <11 MPL U/mL 2 (L)     Anticardiolipin IgA     <22 APL U/mL 10 (L)     Hgb A1c MFr Bld      14.6 (H) 14.1 (H) 6.8   Mean Plasma Glucose     <117 mg/dL 372 (H) 358 (H)    C3 Complement     90 - 180 mg/dL 115     Complement C4, Body Fluid     10 - 40 mg/dL 31     Compl, Total (CH50)     31 - 60 U/mL >60 (H)     ANA Ser Ql     NEGATIVE NEGATIVE     RPR     NON  REACTIVE NON REACTIVE     Sed Rate     0 - 22 mm/hr 25 (H)     AntiThromb III Func     75 - 120 % 125 (H)     Protein C Activity     75 - 133 % 200 (H)     Protein C, Total     72 - 160 % 165 (H)     Protein S Activity     69 - 129 % 120     Protein S Ag, Total     60 - 150 % 133     Homocysteine     4.0 - 15.4 umol/L 8.0     Recommendations-PTGENE:      (NOTE)     HIV     NON REACTIVE NON REACTIVE     TSH     0.350 - 4.500 uIU/mL    1.690    ASSESSMENT: 54 y.o. year old AA female  has a past medical history of Hypertension; Depression, Diabetes mellitus; Bipolar affective disorder, Schizophrenia followed up in clinic for Left MCA distribution infarct from 05/27/13. 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 MRA showed left MCA occlusion. Infarcts felt to be embolic secondary to unknown source, although not able to rule out large vessel asthero due to severe HTN and uncontrolled DM.  Stroke workup negative including carotid Doppler, TCD MES, hypercoagulable workup. Patient refused loop recorder. Residual right hemiparesis and severe aphasia. Needs assistance with all ADL's. Currently BP and glucose in good control. Pt had staring spells with drooling early 2016 and repeat EEG negative. She had a EEG in March 2015 which was negative for seizure. Long term EEG and seizure medication declined by daughter. Pt has no more spells since then.   PLAN: - continue ASA and zocor for stroke prevention  - follow up  with Dr. Read Drivers for botox and rehab - Follow up with primary care physician for stroke risk factor modification. Recommend maintain blood pressure goal <130/80, diabetes with hemoglobin A1c goal below 6.5% and lipids with LDL cholesterol goal below 70 mg/dL.  - check BP and glucose at home - follow up in one year.  I spent more than 25 minutes  of face to face time with the patient. Greater than 50% of time was spent in counseling and coordination of care. We have discussed about stroke recovery and post stroke seizure as well as rehab process after stroke.   No orders of the defined types were placed in this encounter.    Patient Instructions  - continue ASA and zocor for stroke prevention  - follow up with Dr. Read Drivers for botox and rehab - Follow up with primary care physician for stroke risk factor modification. Recommend maintain blood pressure goal <130/80, diabetes with hemoglobin A1c goal below 6.5% and lipids with LDL cholesterol goal below 70 mg/dL.  - check BP and glucose at home - follow up in one year.   Rosalin Hawking, MD PhD [facility] [address], [place], [place] [address] [phone]

## 2015-02-02 NOTE — Patient Instructions (Signed)
-   continue ASA and zocor for stroke prevention  - follow up with Dr. Read Drivers for botox and rehab - Follow up with primary care physician for stroke risk factor modification. Recommend maintain blood pressure goal <130/80, diabetes with hemoglobin A1c goal below 6.5% and lipids with LDL cholesterol goal below 70 mg/dL.  - check BP and glucose at home - follow up in one year.

## 2015-02-02 NOTE — Telephone Encounter (Signed)
Call placed to [facility] # [phone] and spoke to [facility] regarding the process for requesting additional PCS hours. She stated that the patient already has the maximum amount of hours a month - 80 hrs.  The [facility] needs to be resubmitted with the provider attestation if additional hours are being requested. They will then review the request.   Information provided to Chari Manning, NP.

## 2015-02-03 ENCOUNTER — Other Ambulatory Visit: Payer: Self-pay | Admitting: Internal Medicine

## 2015-02-03 ENCOUNTER — Ambulatory Visit: Payer: Medicaid Other | Attending: Internal Medicine | Admitting: Internal Medicine

## 2015-02-03 ENCOUNTER — Encounter: Payer: Self-pay | Admitting: Internal Medicine

## 2015-02-03 VITALS — BP 133/83 | HR 63 | Temp 98.0°F | Resp 16

## 2015-02-03 DIAGNOSIS — K5641 Fecal impaction: Secondary | ICD-10-CM

## 2015-02-03 DIAGNOSIS — R195 Other fecal abnormalities: Secondary | ICD-10-CM | POA: Diagnosis not present

## 2015-02-03 NOTE — Progress Notes (Signed)
Patient here for follow up Patient had been constipated but daughter Stated she has doubled up on her mirilax and her mom Did have a bowel movement but her stools are still really hard

## 2015-02-03 NOTE — Progress Notes (Signed)
Patient ID: [name], female   DOB: 1961/03/02, 54 y.o.   MRN: [id number]  CC: constipation  HPI: [name] is a 54 y.o. female here today for a follow up visit.  atient has past medical history of CVA (2015), HTN, depression, diabetes, bipolar, schizophrenia, and expressive aphasia from stroke. Patient is present with daughter [name] who is her primary caregiver. Daughter reports that patient has been constipated for the past 2 weeks. Patient has only had one bowel movement in the past 2 weeks and it was very dark colored and hard like pellets. Daughter denies any signs of pain in mother. She has tried giving her mother miralax twice per day, senokot, and colace daily without relief. No vomiting.   Patient has No headache, No chest pain, No abdominal pain - No Nausea, No new weakness tingling or numbness, No Cough - SOB. No Known Allergies Past Medical History  Diagnosis Date  . Hypertension   . Depression   . Diabetes mellitus ([place])     ?type (05/29/2013)  . Bipolar affective disorder ([place])   . Schizophrenia ([place])     [name] 11/28/2007 (05/29/2013)  . Stroke [facility]) 05/27/2013   Current Outpatient Prescriptions on File Prior to Visit  Medication Sig Dispense Refill  . acetaminophen (TYLENOL) 325 MG tablet Take 1-2 tablets (325-650 mg total) by mouth every 4 (four) hours as needed for mild pain (available over the counter).    Marland Kitchen amLODipine (NORVASC) 10 MG tablet Take 10 mg by mouth.    Marland Kitchen aspirin 325 MG tablet Take 325 mg by mouth daily.    . baclofen (LIORESAL) 10 MG tablet Take 1 tablet (10 mg total) by mouth 2 (two) times daily. To help with tone/spasms 60 tablet 1  . feeding supplement, ENSURE ENLIVE, (ENSURE ENLIVE) LIQD Take 237 mLs by mouth 2 (two) times daily between meals.    . gabapentin (NEURONTIN) 100 MG capsule Take 1 capsule (100 mg total) by mouth 3 (three) times daily. 90 capsule 3  . glycerin adult (GLYCERIN ADULT) 2 G SUPP Place 1 suppository rectally once as  needed for moderate constipation. 10 suppository 0  . hydrochlorothiazide (HYDRODIURIL) 25 MG tablet Take 1 tablet (25 mg total) by mouth daily. 30 tablet 2  . Insulin Glargine (LANTUS SOLOSTAR) 100 UNIT/ML Solostar Pen Inject 10 Units into the skin daily at 10 pm. Once DAILY 5 pen 11  . metoprolol (LOPRESSOR) 100 MG tablet TAKE 1 TABLET BY MOUTH THREE TIMES DAILY 90 tablet 1  . polyethylene glycol powder (GLYCOLAX/MIRALAX) powder Take 17 g by mouth daily. (Patient taking differently: Take 17 g by mouth daily as needed (constipation). ) 527 g 3  . senna (SENOKOT) 8.6 MG tablet Take 8.6 mg by mouth.    . simvastatin (ZOCOR) 20 MG tablet TAKE 1 TABLET BY MOUTH DAILY AT 6 PM 30 tablet 1  . ciprofloxacin (CIPRO) 500 MG tablet Take 1 tablet (500 mg total) by mouth 2 (two) times daily. 14 tablet 0  . polyethylene glycol powder (GLYCOLAX/MIRALAX) powder Take by mouth.    . senna (SENNA LAX) 8.6 MG tablet Take 1 tablet (8.6 mg total) by mouth daily.     No current facility-administered medications on file prior to visit.   Family History  Problem Relation Age of Onset  . Hypertension Mother   . Stroke Father   . Hypertension Father   . Diabetes Father    Social History   Social History  . Marital Status: Single  Spouse Name: N/A  . Number of Children: 3  . Years of Education: 12   Occupational History  . disabled    Social History Main Topics  . Smoking status: Never Smoker   . Smokeless tobacco: Never Used  . Alcohol Use: No  . Drug Use: No  . Sexual Activity: Not on file   Other Topics Concern  . Not on file   Social History Narrative   Patient is single with 3 children.   Patient is right handed.   Patient has hs education.   Patient does not drink caffeine.    Review of Systems: Other than what is stated in HPI, all other systems are negative.     Objective:   Filed Vitals:   02/03/15 1401  BP: 133/83  Pulse: 63  Temp: 98 F (36.7 C)  Resp: 16    Physical  Exam  Cardiovascular: Normal rate, regular rhythm and normal heart sounds.   Abdominal: Soft. Bowel sounds are normal. There is no tenderness.  Genitourinary: Rectal exam shows anal tone abnormal.  Very hard stools in anal canal, unable to remove stool. Heme + stools  Neurological: A cranial nerve deficit (facial droop) is present.  Global aphasia     Lab Results  Component Value Date   WBC 6.3 01/08/2015   HGB 14.6 01/08/2015   HCT 43.0 01/08/2015   MCV 89.7 01/08/2015   PLT 192 01/08/2015   Lab Results  Component Value Date   CREATININE 0.80 01/08/2015   BUN 16 01/08/2015   NA 142 01/08/2015   K 4.6 01/08/2015   CL 101 01/08/2015   CO2 30 01/08/2015    Lab Results  Component Value Date   HGBA1C 5.50 01/19/2015   Lipid Panel     Component Value Date/Time   CHOL 105 12/06/2013 0345   TRIG 66 12/06/2013 0345   HDL 42 12/06/2013 0345   CHOLHDL 2.5 12/06/2013 0345   VLDL 13 12/06/2013 0345   LDLCALC 50 12/06/2013 0345       Assessment and plan:   Vedha was seen today for follow-up.  Diagnoses and all orders for this visit:  Fecal impaction ([place]) Unable to remove stool. Will have patient to complete bowel prep with gatorade and miralax. Hopefully this will loosen stool enough to cause a bowel movement. Return precautions addressed with daughter.  Heme Positive stools I highly believe patient was heme positive due to large fecal impaction and hard stools. I will place order for colonoscopy on next visit.  Return if symptoms worsen or fail to improve.       Lance Bosch, [place] [facility] [phone] 02/03/2015, 3:03 PM

## 2015-02-16 ENCOUNTER — Telehealth: Payer: Self-pay | Admitting: Internal Medicine

## 2015-02-16 ENCOUNTER — Telehealth: Payer: Self-pay

## 2015-02-16 NOTE — Telephone Encounter (Signed)
Returned daughter phone call Stated she is having a bowel movements but more like "smeared" Daughter also states her mother's feet have been really swollen and they are Elevating the feet two to three times a day Daughter would like for you to give her a call when you have a moment

## 2015-02-16 NOTE — Telephone Encounter (Signed)
She wants to know if pt is okay to continue her miralax daily. She is still constipated.

## 2015-02-18 NOTE — Telephone Encounter (Signed)
Please call patient and find out her concerns. Keep feet elevated to prevent swelling and make sure she is not eating ton of salts. Find out if her bowel movements have improved---did she have one after the miralax dosing we gave her on last visit. If she is still constipated she may repeat miralax dose

## 2015-02-19 ENCOUNTER — Encounter: Payer: Medicaid Other | Attending: Physical Medicine & Rehabilitation

## 2015-02-19 ENCOUNTER — Telehealth: Payer: Self-pay | Admitting: Internal Medicine

## 2015-02-19 ENCOUNTER — Encounter: Payer: Self-pay | Admitting: Physical Medicine & Rehabilitation

## 2015-02-19 ENCOUNTER — Ambulatory Visit (HOSPITAL_BASED_OUTPATIENT_CLINIC_OR_DEPARTMENT_OTHER): Payer: Medicaid Other | Admitting: Physical Medicine & Rehabilitation

## 2015-02-19 VITALS — BP 122/81 | HR 54 | Resp 14

## 2015-02-19 DIAGNOSIS — I69320 Aphasia following cerebral infarction: Secondary | ICD-10-CM | POA: Diagnosis not present

## 2015-02-19 DIAGNOSIS — G8110 Spastic hemiplegia affecting unspecified side: Secondary | ICD-10-CM

## 2015-02-19 DIAGNOSIS — G8190 Hemiplegia, unspecified affecting unspecified side: Secondary | ICD-10-CM | POA: Diagnosis not present

## 2015-02-19 DIAGNOSIS — M7501 Adhesive capsulitis of right shoulder: Secondary | ICD-10-CM | POA: Insufficient documentation

## 2015-02-19 DIAGNOSIS — F209 Schizophrenia, unspecified: Secondary | ICD-10-CM | POA: Insufficient documentation

## 2015-02-19 DIAGNOSIS — I639 Cerebral infarction, unspecified: Secondary | ICD-10-CM | POA: Diagnosis not present

## 2015-02-19 DIAGNOSIS — F329 Major depressive disorder, single episode, unspecified: Secondary | ICD-10-CM | POA: Insufficient documentation

## 2015-02-19 DIAGNOSIS — F319 Bipolar disorder, unspecified: Secondary | ICD-10-CM | POA: Insufficient documentation

## 2015-02-19 DIAGNOSIS — I10 Essential (primary) hypertension: Secondary | ICD-10-CM | POA: Insufficient documentation

## 2015-02-19 DIAGNOSIS — R482 Apraxia: Secondary | ICD-10-CM | POA: Insufficient documentation

## 2015-02-19 DIAGNOSIS — E119 Type 2 diabetes mellitus without complications: Secondary | ICD-10-CM | POA: Diagnosis not present

## 2015-02-19 NOTE — Patient Instructions (Signed)

## 2015-02-19 NOTE — Telephone Encounter (Signed)
Alisson a Electrical engineer needs orders for pt's speech therapy for the next two weeks. Please follow up with Alisson to provide her with orders. Thank you.

## 2015-02-19 NOTE — Progress Notes (Signed)
Botox Injection for spasticity using needle EMG guidance  Dilution: 50 Units/ml Indication: Severe spasticity which interferes with ADL,mobility and/or  hygiene and is unresponsive to medication management and other conservative care Informed consent was obtained after describing risks and benefits of the procedure with the patient. This includes bleeding, bruising, infection, excessive weakness, or medication side effects. A REMS form is on file and signed. Needle: 27g 1" needle electrode Number of units per muscle Pectoralis0 Biceps50 FCR25 FCU25 FDS25 FDP25 FPL25 Brachiorad25  All injections were done after obtaining appropriate EMG activity and after negative drawback for blood. The patient tolerated the procedure well. Post procedure instructions were given. A followup appointment was made.

## 2015-02-22 ENCOUNTER — Telehealth: Payer: Self-pay

## 2015-02-22 MED ORDER — POLYETHYLENE GLYCOL 3350 17 GM/SCOOP PO POWD: 17.0000 g | Freq: Every day | ORAL | Status: DC | PRN

## 2015-02-22 MED ORDER — POLYETHYLENE GLYCOL 3350 17 GM/SCOOP PO POWD: 17.0000 g | Freq: Every day | ORAL | Status: AC | PRN

## 2015-02-22 NOTE — Telephone Encounter (Signed)
Pt Nurse notified. Is okay to continue with miralax daily if needed Pt daughter will call for appointment

## 2015-02-22 NOTE — Telephone Encounter (Signed)
polyethylene glycol powder (GLYCOLAX/MIRALAX) powder

## 2015-02-22 NOTE — Telephone Encounter (Signed)
Spoke with Amy(visiting nurse) Per patient daughter her mom is not acting right Very confused, bilateral leg swelling Can we double book you for one day this week They are trying to keep her out of the hospital thanks

## 2015-02-22 NOTE — Telephone Encounter (Signed)
Spoke with Alonna Minium speech therapist @[phone] Verbal orders given to continue speech therapy for the next six weeks

## 2015-02-23 ENCOUNTER — Telehealth: Payer: Self-pay

## 2015-02-23 ENCOUNTER — Other Ambulatory Visit: Payer: Self-pay | Admitting: Internal Medicine

## 2015-02-23 NOTE — Telephone Encounter (Signed)
Nurse called patient's daughter. Daughter's friend answered explaining [name] was in ED. Patient is not at ED. Daughter is there for herself. Nurse explained she will return call to [facility] tomorrow.

## 2015-02-23 NOTE — Telephone Encounter (Signed)
Returned phone call to [employer] was not available -was in the ED for a syncope episode Spoke with her friend Raquel who will relay the message to her when she is up and  Feeling better

## 2015-02-24 NOTE — Telephone Encounter (Signed)
Patient's daughter reports feet are still swollen, patient has knot on right leg. Per patient's daughter,  [facility]urse from [facility] came out and looked at knot, [facility] nurse did not suspect a blood clot because you don't usually see blood clots. Feet are so swollen, patient can not wear any type of shoes, swelling extends up legs midway to knees. Patient had a good, big bowel movement when [place] came out. Patient is not currently having issues with bowel movements. Daughter unaware of last bowel movement because she is not usually there during the day.  Nurse will send message to provider.

## 2015-03-02 ENCOUNTER — Ambulatory Visit: Payer: Medicaid Other | Admitting: Internal Medicine

## 2015-03-04 ENCOUNTER — Other Ambulatory Visit: Payer: Self-pay | Admitting: Internal Medicine

## 2015-03-04 ENCOUNTER — Encounter: Payer: Self-pay | Admitting: Internal Medicine

## 2015-03-04 ENCOUNTER — Ambulatory Visit: Payer: Medicaid Other | Attending: Internal Medicine | Admitting: Internal Medicine

## 2015-03-04 VITALS — BP 133/85 | HR 66 | Temp 97.6°F | Resp 18 | Wt 110.6 lb

## 2015-03-04 DIAGNOSIS — R410 Disorientation, unspecified: Secondary | ICD-10-CM | POA: Diagnosis not present

## 2015-03-04 DIAGNOSIS — R51 Headache: Secondary | ICD-10-CM | POA: Insufficient documentation

## 2015-03-04 DIAGNOSIS — F319 Bipolar disorder, unspecified: Secondary | ICD-10-CM | POA: Diagnosis not present

## 2015-03-04 DIAGNOSIS — I10 Essential (primary) hypertension: Secondary | ICD-10-CM | POA: Diagnosis not present

## 2015-03-04 DIAGNOSIS — Z7982 Long term (current) use of aspirin: Secondary | ICD-10-CM | POA: Diagnosis not present

## 2015-03-04 DIAGNOSIS — R350 Frequency of micturition: Secondary | ICD-10-CM

## 2015-03-04 DIAGNOSIS — R4701 Aphasia: Secondary | ICD-10-CM | POA: Diagnosis not present

## 2015-03-04 DIAGNOSIS — R0981 Nasal congestion: Secondary | ICD-10-CM

## 2015-03-04 DIAGNOSIS — E1149 Type 2 diabetes mellitus with other diabetic neurological complication: Secondary | ICD-10-CM | POA: Diagnosis not present

## 2015-03-04 DIAGNOSIS — Z794 Long term (current) use of insulin: Secondary | ICD-10-CM | POA: Insufficient documentation

## 2015-03-04 DIAGNOSIS — Z8673 Personal history of transient ischemic attack (TIA), and cerebral infarction without residual deficits: Secondary | ICD-10-CM | POA: Insufficient documentation

## 2015-03-04 DIAGNOSIS — Z79899 Other long term (current) drug therapy: Secondary | ICD-10-CM | POA: Diagnosis not present

## 2015-03-04 DIAGNOSIS — F209 Schizophrenia, unspecified: Secondary | ICD-10-CM | POA: Diagnosis not present

## 2015-03-04 LAB — POCT URINALYSIS DIPSTICK
GLUCOSE UA: NEGATIVE
Ketones, UA: NEGATIVE
Leukocytes, UA: NEGATIVE
NITRITE UA: NEGATIVE
Protein, UA: NEGATIVE
RBC UA: NEGATIVE
Spec Grav, UA: >=1.03
Urobilinogen, UA: 2
pH, UA: 6.5

## 2015-03-04 LAB — GLUCOSE, POCT (MANUAL RESULT ENTRY): POC GLUCOSE: 77 mg/dL (ref 70–99)

## 2015-03-04 MED ORDER — FLUTICASONE PROPIONATE 50 MCG/ACT NA SUSP: 2.0000 | Freq: Every day | NASAL | Status: DC

## 2015-03-04 NOTE — Progress Notes (Signed)
Patient here for disorientation. Patient accompanied by her daughter. Patient daughter states she has noticed some signs of disorientation.  Patient reported yesterday she didn't feel well and was taking a long time to respond. Patient BP elevated this morning and reported she had a headache, light headed and weak. Patient has taken her morning medications and does not need any refills.

## 2015-03-04 NOTE — Progress Notes (Signed)
Patient ID: [name], female   DOB: 1960/08/25, 54 y.o.   MRN: [id number]  CC: disoriented  HPI: [name] is a 54 y.o. female here today for a follow up visit.  Patient has past medical history of diabetes, HTN, bipolar, schizophrenia, stroke, and global aphasia. Patient is present with daughter who is giving history. Daughter states that she just returned from out of town and was told by her brothers that the patient was showing signs of disorientation. Since the daughter has returned she has noticed her mother staring off in space often and taking longer to respond. Patient reportedly told daughter she does not feel well. When questioned about symptoms she shakes her head yes to headache and weakness. Daughter has noticed more rhinitis and patient sounds congested when speaking.   No Known Allergies Past Medical History  Diagnosis Date  . Hypertension   . Depression   . Diabetes mellitus ([place])     ?type (05/29/2013)  . Bipolar affective disorder ([place])   . Schizophrenia ([place])     [name] 11/28/2007 (05/29/2013)  . Stroke [facility]) 05/27/2013   Current Outpatient Prescriptions on File Prior to Visit  Medication Sig Dispense Refill  . acetaminophen (TYLENOL) 325 MG tablet Take 1-2 tablets (325-650 mg total) by mouth every 4 (four) hours as needed for mild pain (available over the counter).    Marland Kitchen amLODipine (NORVASC) 10 MG tablet Take 10 mg by mouth.    Marland Kitchen aspirin 325 MG tablet Take 325 mg by mouth daily.    . baclofen (LIORESAL) 10 MG tablet TAKE 1 TABLET BY MOUTH TWO TIMES DAILY TO HELP WITH TONE/SPASMS 60 tablet 0  . feeding supplement, ENSURE ENLIVE, (ENSURE ENLIVE) LIQD Take 237 mLs by mouth 2 (two) times daily between meals.    . gabapentin (NEURONTIN) 100 MG capsule Take 1 capsule (100 mg total) by mouth 3 (three) times daily. 90 capsule 3  . hydrochlorothiazide (HYDRODIURIL) 25 MG tablet TAKE 1 TABLET BY MOUTH EVERY DAY 30 tablet 3  . Insulin Glargine (LANTUS SOLOSTAR) 100  UNIT/ML Solostar Pen Inject 10 Units into the skin daily at 10 pm. Once DAILY 5 pen 11  . metoprolol (LOPRESSOR) 100 MG tablet TAKE 1 TABLET BY MOUTH THREE TIMES DAILY 90 tablet 1  . polyethylene glycol powder (GLYCOLAX/MIRALAX) powder Take 17 g by mouth daily as needed (constipation). 527 g 3  . senna (SENNA LAX) 8.6 MG tablet Take 1 tablet (8.6 mg total) by mouth daily.    Marland Kitchen senna (SENOKOT) 8.6 MG tablet Take 8.6 mg by mouth.    . simvastatin (ZOCOR) 20 MG tablet TAKE 1 TABLET BY MOUTH DAILY AT 6 PM 30 tablet 1  . glycerin adult (GLYCERIN ADULT) 2 G SUPP Place 1 suppository rectally once as needed for moderate constipation. (Patient not taking: Reported on 03/04/2015) 10 suppository 0   No current facility-administered medications on file prior to visit.   Family History  Problem Relation Age of Onset  . Hypertension Mother   . Stroke Father   . Hypertension Father   . Diabetes Father    Social History   Social History  . Marital Status: Single    Spouse Name: N/A  . Number of Children: 3  . Years of Education: 12   Occupational History  . disabled    Social History Main Topics  . Smoking status: Never Smoker   . Smokeless tobacco: Never Used  . Alcohol Use: No  . Drug Use: No  . Sexual  Activity: Not on file   Other Topics Concern  . Not on file   Social History Narrative   Patient is single with 3 children.   Patient is right handed.   Patient has hs education.   Patient does not drink caffeine.    Review of Systems: Unable to complete     Objective:   Filed Vitals:   03/04/15 1040  BP: 133/85  Pulse: 66  Temp: 97.6 F (36.4 C)  Resp: 18    Physical Exam  Cardiovascular: Normal rate, regular rhythm and normal heart sounds.   Pulmonary/Chest: Effort normal and breath sounds normal.  Abdominal: She exhibits no distension. There is no tenderness.  No CVA tenderness  Neurological: She is alert.  Appears at baseline Right sided CVA residual Facial  droop   Skin: Skin is warm and dry.  Psychiatric: She has a normal mood and affect.     Lab Results  Component Value Date   WBC 6.3 01/08/2015   HGB 14.6 01/08/2015   HCT 43.0 01/08/2015   MCV 89.7 01/08/2015   PLT 192 01/08/2015   Lab Results  Component Value Date   CREATININE 0.80 01/08/2015   BUN 16 01/08/2015   NA 142 01/08/2015   K 4.6 01/08/2015   CL 101 01/08/2015   CO2 30 01/08/2015    Lab Results  Component Value Date   HGBA1C 5.50 01/19/2015   Lipid Panel     Component Value Date/Time   CHOL 105 12/06/2013 0345   TRIG 66 12/06/2013 0345   HDL 42 12/06/2013 0345   CHOLHDL 2.5 12/06/2013 0345   VLDL 13 12/06/2013 0345   LDLCALC 50 12/06/2013 0345       Assessment and plan:   Diagnoses and all orders for this visit:  Episodic confusion This is a ongoing concern. She appears at baseline for me today and has been evaluated in the ER several times for similar complaints. I am unsure if episodes could be related to her psych history of bipolar/schizophrenia. I have suggested that daughter call to Dr. Darleene Cleaver office to schedule appointment.   Frequent urination -     POCT urinalysis dipstick -     Urine culture Dipstick is negative but I will send urine for culture  Nasal congestion -    Begin (FLONASE) 50 MCG/ACT nasal spray; Place 2 sprays into both nostrils daily.  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       Lance Bosch, [place] [facility] [phone] 03/04/2015, 10:57 AM

## 2015-03-06 LAB — URINE CULTURE

## 2015-03-08 ENCOUNTER — Telehealth: Payer: Self-pay

## 2015-03-08 NOTE — Telephone Encounter (Signed)
-----   Message from Lance Bosch, NP sent at 03/08/2015  4:38 PM EST ----- Normal urine culture

## 2015-03-08 NOTE — Telephone Encounter (Signed)
Spoke with patient's daughter Radene Journey and she is aware of her mother's Normal urine culture results

## 2015-03-16 ENCOUNTER — Other Ambulatory Visit: Payer: Self-pay | Admitting: Internal Medicine

## 2015-03-17 ENCOUNTER — Other Ambulatory Visit: Payer: Self-pay

## 2015-03-24 ENCOUNTER — Other Ambulatory Visit: Payer: Self-pay | Admitting: Internal Medicine

## 2015-03-31 ENCOUNTER — Other Ambulatory Visit: Payer: Self-pay | Admitting: Internal Medicine

## 2015-04-02 ENCOUNTER — Encounter: Payer: Medicaid Other | Attending: Physical Medicine & Rehabilitation

## 2015-04-02 ENCOUNTER — Ambulatory Visit: Payer: Medicaid Other | Admitting: Physical Medicine & Rehabilitation

## 2015-04-02 ENCOUNTER — Telehealth: Payer: Self-pay

## 2015-04-02 DIAGNOSIS — R482 Apraxia: Secondary | ICD-10-CM | POA: Insufficient documentation

## 2015-04-02 DIAGNOSIS — M7501 Adhesive capsulitis of right shoulder: Secondary | ICD-10-CM | POA: Insufficient documentation

## 2015-04-02 DIAGNOSIS — I639 Cerebral infarction, unspecified: Secondary | ICD-10-CM | POA: Insufficient documentation

## 2015-04-02 DIAGNOSIS — F209 Schizophrenia, unspecified: Secondary | ICD-10-CM | POA: Insufficient documentation

## 2015-04-02 DIAGNOSIS — G8190 Hemiplegia, unspecified affecting unspecified side: Secondary | ICD-10-CM | POA: Insufficient documentation

## 2015-04-02 DIAGNOSIS — G8110 Spastic hemiplegia affecting unspecified side: Secondary | ICD-10-CM | POA: Insufficient documentation

## 2015-04-02 DIAGNOSIS — I69320 Aphasia following cerebral infarction: Secondary | ICD-10-CM | POA: Insufficient documentation

## 2015-04-02 DIAGNOSIS — B359 Dermatophytosis, unspecified: Secondary | ICD-10-CM

## 2015-04-02 DIAGNOSIS — F329 Major depressive disorder, single episode, unspecified: Secondary | ICD-10-CM | POA: Insufficient documentation

## 2015-04-02 DIAGNOSIS — I10 Essential (primary) hypertension: Secondary | ICD-10-CM | POA: Insufficient documentation

## 2015-04-02 DIAGNOSIS — F319 Bipolar disorder, unspecified: Secondary | ICD-10-CM | POA: Insufficient documentation

## 2015-04-02 DIAGNOSIS — E119 Type 2 diabetes mellitus without complications: Secondary | ICD-10-CM | POA: Insufficient documentation

## 2015-04-02 NOTE — Telephone Encounter (Signed)
Daughter came into office to speak with Dr Adrian Blackwater She make mention that her mom is still really constipated-having to  Do digital removal Was cleaning between her toes and she is having some pain And black flaking -no fever Requested a referral to podiatry-i placed the order in epic Patient has an appt. With you on the 1/18

## 2015-04-05 IMAGING — CR DG CHEST 2V
2 series · 2 of 2 positions shown · non-contrast
Comparison: 05/27/2013

CLINICAL DATA: Hypertension.  Diabetes.  Tachycardia.

EXAM:
CHEST  2 VIEW

[w chest pa]
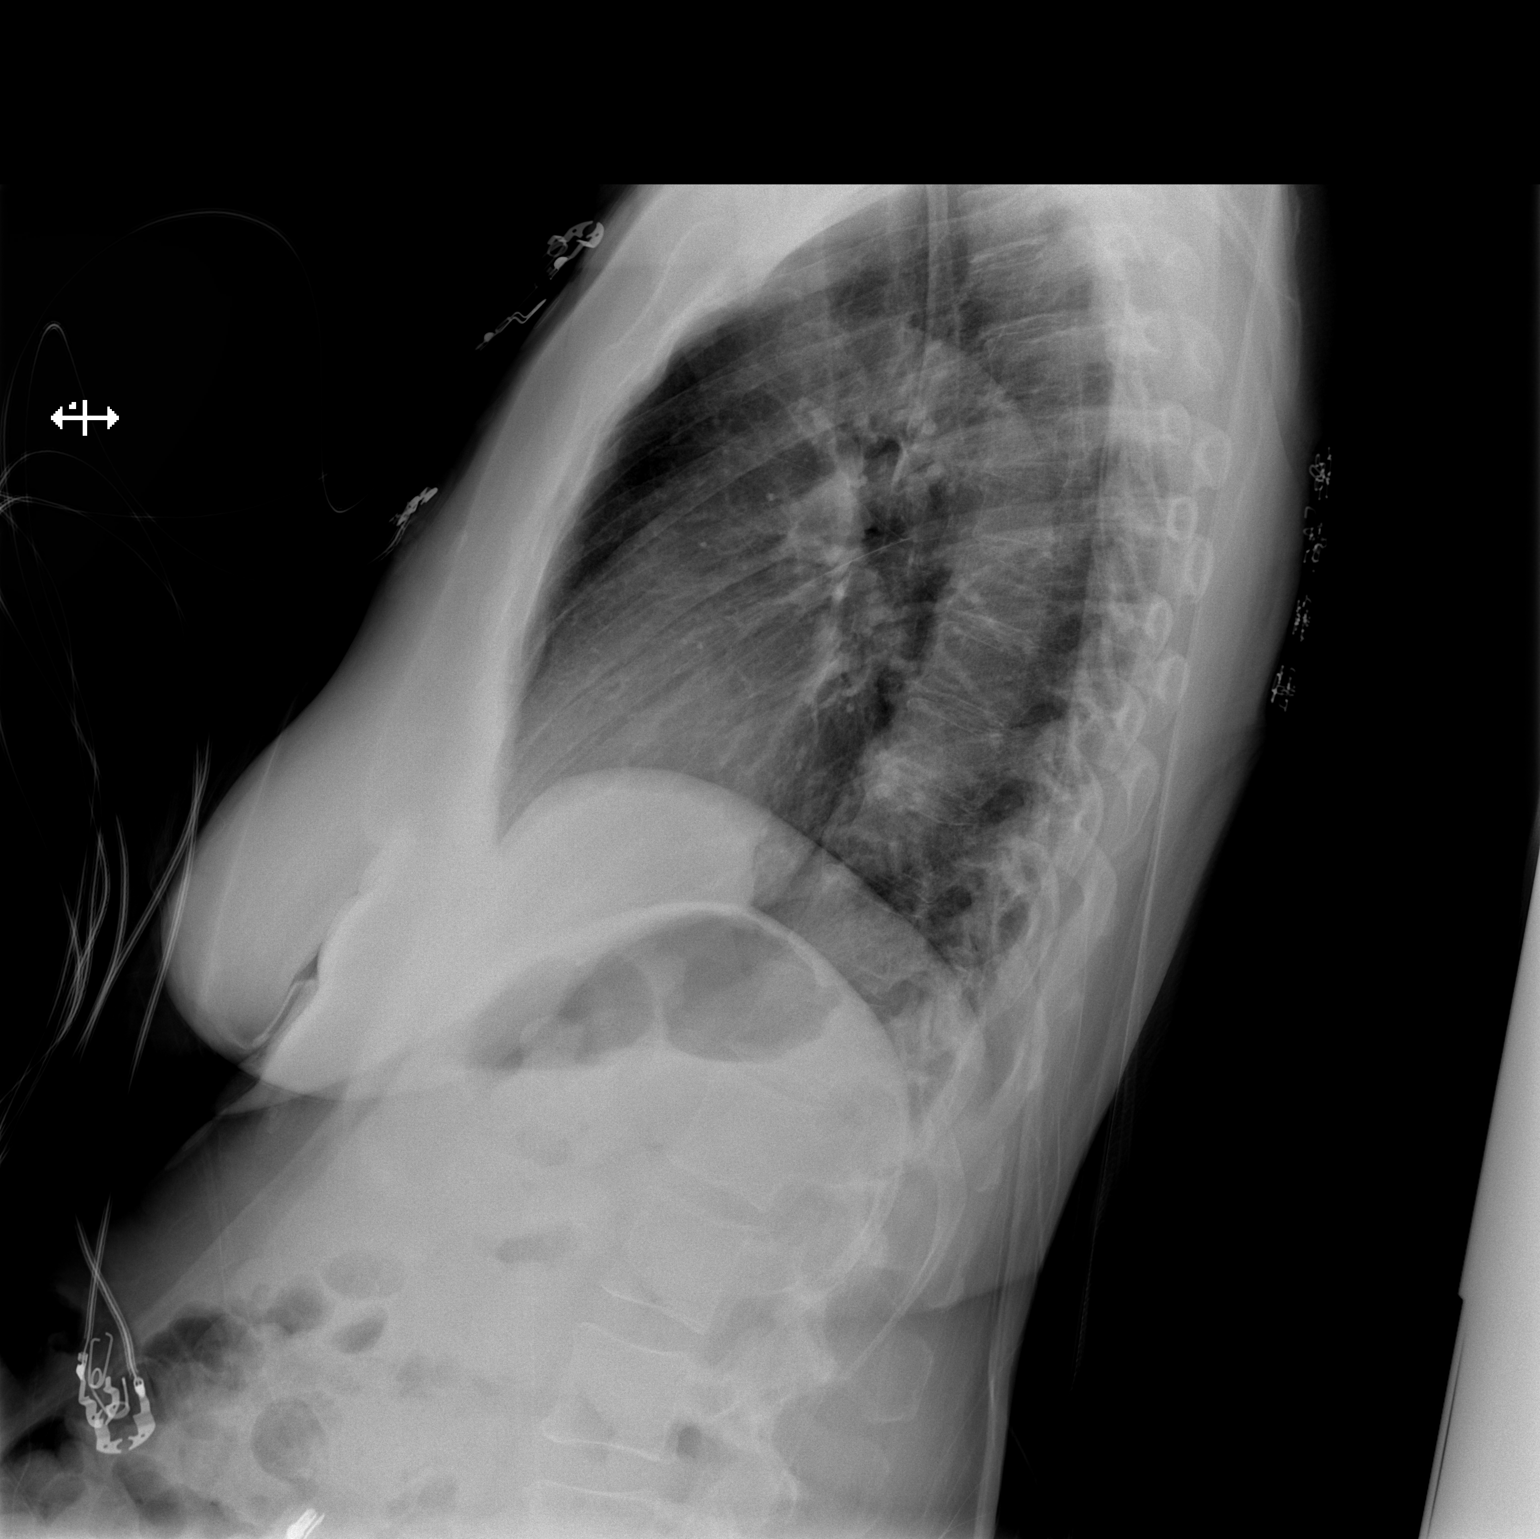

[x chest ap]
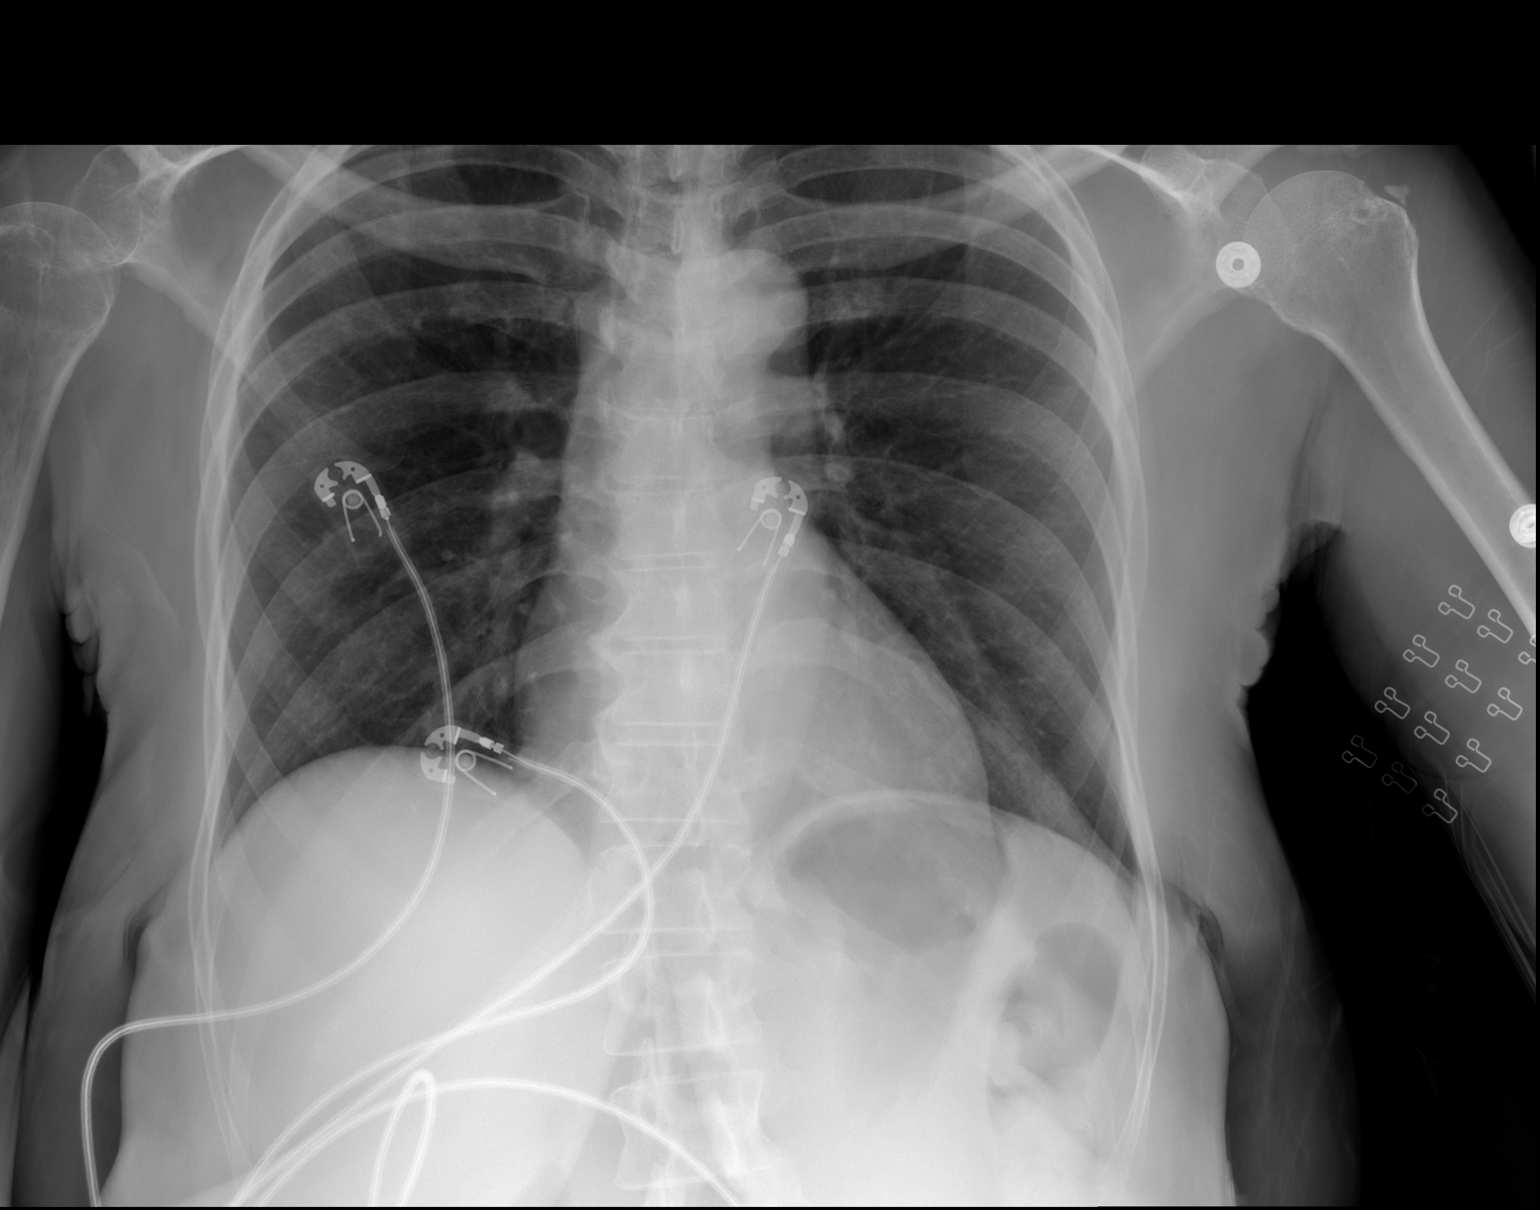

[2 of 2 positions shown; findings below may reference images not displayed]

FINDINGS: The heart size and mediastinal contours are within normal limits.
Both lungs are clear. There are degenerative changes in the thoracic
spine. The right shoulder appears inferiorly subluxed.
IMPRESSION: 1.  No evidence for acute cardiopulmonary abnormality.
2. Subluxation or dislocation of the right shoulder.

## 2015-04-07 ENCOUNTER — Ambulatory Visit (HOSPITAL_BASED_OUTPATIENT_CLINIC_OR_DEPARTMENT_OTHER): Payer: Medicaid Other | Admitting: Internal Medicine

## 2015-04-07 ENCOUNTER — Ambulatory Visit (HOSPITAL_COMMUNITY)
Admission: RE | Admit: 2015-04-07 | Discharge: 2015-04-07 | Disposition: A | Discharge status: Home / Self Care | Payer: Medicaid Other | Source: Ambulatory Visit | Attending: Internal Medicine | Admitting: Internal Medicine

## 2015-04-07 ENCOUNTER — Encounter: Payer: Self-pay | Admitting: Internal Medicine

## 2015-04-07 VITALS — BP 117/78 | HR 66 | Temp 98.0°F | Resp 16

## 2015-04-07 DIAGNOSIS — M7989 Other specified soft tissue disorders: Secondary | ICD-10-CM | POA: Insufficient documentation

## 2015-04-07 DIAGNOSIS — M79671 Pain in right foot: Secondary | ICD-10-CM

## 2015-04-07 DIAGNOSIS — K5900 Constipation, unspecified: Secondary | ICD-10-CM

## 2015-04-07 DIAGNOSIS — R600 Localized edema: Secondary | ICD-10-CM | POA: Diagnosis not present

## 2015-04-07 LAB — BASIC METABOLIC PANEL
BUN: 19 mg/dL (ref 7–25)
CO2: 34 mmol/L — ABNORMAL HIGH (ref 20–31)
Calcium: 9.3 mg/dL (ref 8.6–10.4)
Chloride: 104 mmol/L (ref 98–110)
Creat: 0.8 mg/dL (ref 0.50–1.05)
GLUCOSE: 97 mg/dL (ref 65–99)
POTASSIUM: 4 mmol/L (ref 3.5–5.3)
SODIUM: 144 mmol/L (ref 135–146)

## 2015-04-07 MED ORDER — FUROSEMIDE 20 MG PO TABS: 20.0000 mg | ORAL_TABLET | Freq: Every day | ORAL | Status: DC

## 2015-04-07 NOTE — Progress Notes (Signed)
Patient here with daughter Complains of bilateral foot swelling Also still having trouble with bowel movements Is taking mirilax and drinking plenty of fluids but still  Only producing little pellets

## 2015-04-07 NOTE — Progress Notes (Signed)
Patient ID: [name], female   DOB: 26-Aug-1960, 55 y.o.   MRN: [id number]  CC: foot swelling  HPI: Christina Barbati is a 55 y.o. female here today for a follow up visit.  Patient has past medical history of diabetes, HTN, bipolar, schizophrenia, stroke, and global aphasia. Patient is present with Daughter [name] who speaks for the patient. Daughter reports that patients bilateral feet have been swollen more than usual for the past week. She has not noticed any pain or injury. She is reports that the home aid helps her to get dressed but she has not said anything about pain.   Patient has No headache, No chest pain, No abdominal pain - No Nausea, No new weakness tingling or numbness, No Cough - SOB.  No Known Allergies Past Medical History  Diagnosis Date  . Hypertension   . Depression   . Diabetes mellitus ([place])     ?type (05/29/2013)  . Bipolar affective disorder ([place])   . Schizophrenia ([place])     [name] 11/28/2007 (05/29/2013)  . Stroke [facility]) 05/27/2013   Current Outpatient Prescriptions on File Prior to Visit  Medication Sig Dispense Refill  . acetaminophen (TYLENOL) 325 MG tablet Take 1-2 tablets (325-650 mg total) by mouth every 4 (four) hours as needed for mild pain (available over the counter).    Marland Kitchen amLODipine (NORVASC) 10 MG tablet Take 10 mg by mouth.    Marland Kitchen amLODipine (NORVASC) 10 MG tablet TAKE 1 TABLET BY MOUTH EVERY DAY FOR BLOOD PRESSURE 90 tablet 0  . aspirin 325 MG tablet Take 325 mg by mouth daily.    . baclofen (LIORESAL) 10 MG tablet TAKE 1 TABLET BY MOUTH TWO TIMES DAILY TO HELP WITH TONE/SPASMS 60 tablet 0  . fluticasone (FLONASE) 50 MCG/ACT nasal spray Place 2 sprays into both nostrils daily. 16 g 6  . gabapentin (NEURONTIN) 100 MG capsule TAKE 1 CAPSULE BY MOUTH THREE TIMES DAILY 90 capsule 2  . hydrochlorothiazide (HYDRODIURIL) 25 MG tablet TAKE 1 TABLET BY MOUTH EVERY DAY 30 tablet 3  . Insulin Glargine (LANTUS SOLOSTAR) 100 UNIT/ML Solostar Pen Inject 10 Units  into the skin daily at 10 pm. Once DAILY 5 pen 11  . metoprolol (LOPRESSOR) 100 MG tablet TAKE 1 TABLET BY MOUTH THREE TIMES DAILY 90 tablet 1  . polyethylene glycol powder (GLYCOLAX/MIRALAX) powder Take 17 g by mouth daily as needed (constipation). 527 g 3  . SENNA LAX 8.6 MG tablet TAKE 1 TABLET BY MOUTH EVERY DAY AS NEEDED FOR CONSTIPATION 30 tablet 2  . simvastatin (ZOCOR) 20 MG tablet TAKE 1 TABLET BY MOUTH DAILY AT 6 PM 30 tablet 1  . feeding supplement, ENSURE ENLIVE, (ENSURE ENLIVE) LIQD Take 237 mLs by mouth 2 (two) times daily between meals.    Marland Kitchen glycerin adult (GLYCERIN ADULT) 2 G SUPP Place 1 suppository rectally once as needed for moderate constipation. (Patient not taking: Reported on 03/04/2015) 10 suppository 0  . UNIFINE PENTIPS 31G X 6 MM MISC USE AS PRESCRIBED BY PHYSICIAN 100 each 11   No current facility-administered medications on file prior to visit.   Family History  Problem Relation Age of Onset  . Hypertension Mother   . Stroke Father   . Hypertension Father   . Diabetes Father    Social History   Social History  . Marital Status: Single    Spouse Name: N/A  . Number of Children: 3  . Years of Education: 12   Occupational History  . disabled  Social History Main Topics  . Smoking status: Never Smoker   . Smokeless tobacco: Never Used  . Alcohol Use: No  . Drug Use: No  . Sexual Activity: Not on file   Other Topics Concern  . Not on file   Social History Narrative   Patient is single with 3 children.   Patient is right handed.   Patient has hs education.   Patient does not drink caffeine.    Review of Systems: Constitutional: Negative for fever, chills, diaphoresis, activity change, appetite change and fatigue. HENT: Negative for ear pain, nosebleeds, congestion, facial swelling, rhinorrhea, neck pain, neck stiffness and ear discharge.  Eyes: Negative for pain, discharge, redness, itching and visual disturbance. Respiratory: Negative for  cough, choking, chest tightness, shortness of breath, wheezing and stridor.  Cardiovascular: Negative for chest pain, palpitations and leg swelling. Gastrointestinal: Negative for abdominal distention. Genitourinary: Negative for dysuria, urgency, frequency, hematuria, flank pain, decreased urine volume, difficulty urinating and dyspareunia.  Musculoskeletal: Negative for back pain, joint swelling, arthralgias and gait problem. Neurological: Negative for dizziness, tremors, seizures, syncope, facial asymmetry, speech difficulty, weakness, light-headedness, numbness and headaches.  Hematological: Negative for adenopathy. Does not bruise/bleed easily. Psychiatric/Behavioral: Negative for hallucinations, behavioral problems, confusion, dysphoric mood, decreased concentration and agitation.    Objective:   Filed Vitals:   04/07/15 1406  BP: 117/78  Pulse: 66  Temp: 98 F (36.7 C)  Resp: 16    Physical Exam  Constitutional: She is oriented to person, place, and time.  Cardiovascular: Normal rate, regular rhythm and normal heart sounds.   Pulmonary/Chest: Effort normal and breath sounds normal.  Musculoskeletal: She exhibits edema (BLE R>L) and tenderness (right foot tenderness when touched).  Neurological: She is alert and oriented to person, place, and time.     Lab Results  Component Value Date   WBC 6.3 01/08/2015   HGB 14.6 01/08/2015   HCT 43.0 01/08/2015   MCV 89.7 01/08/2015   PLT 192 01/08/2015   Lab Results  Component Value Date   CREATININE 0.80 01/08/2015   BUN 16 01/08/2015   NA 142 01/08/2015   K 4.6 01/08/2015   CL 101 01/08/2015   CO2 30 01/08/2015    Lab Results  Component Value Date   HGBA1C 5.50 01/19/2015   Lipid Panel     Component Value Date/Time   CHOL 105 12/06/2013 0345   TRIG 66 12/06/2013 0345   HDL 42 12/06/2013 0345   CHOLHDL 2.5 12/06/2013 0345   VLDL 13 12/06/2013 0345   LDLCALC 50 12/06/2013 0345       Assessment and plan:    Thiara was seen today for foot swelling.  Diagnoses and all orders for this visit:  Foot pain, right -     DG Foot Complete Right; Future -     Basic Metabolic Panel Since patient is non-verbal, I cannot determine description of pain or etiology of pain. I will xray foot to be safe.   Edema of foot -     furosemide (LASIX) 20 MG tablet; Take 1 tablet (20 mg total) by mouth daily. She will take a couple of days of Lasix to remove fluid. Stressed that this will likely be a chronic issue since she is not mobile and her feet are in the dependent position all day. Pain may be from swelling   Constipation, unspecified constipation type I have explained that constipation will be a chronic issue as well since patient in non ambulatory. Whenever patient has constipation  daughter may use Miralax twice per day and a stool softener before bed as needed. If diarrhea occurs she can stop Miralax for the next couple of days and then resume.   Return if symptoms worsen or fail to improve.    Lance Bosch, [place] [facility] [phone] 04/07/2015, 2:12 PM

## 2015-04-08 ENCOUNTER — Telehealth: Payer: Self-pay

## 2015-04-08 NOTE — Telephone Encounter (Signed)
Spoke with daughter [name] and she is aware the X ray was negative

## 2015-04-08 NOTE — Telephone Encounter (Signed)
-----   Message from Lance Bosch, NP sent at 04/08/2015  9:01 AM EST ----- No fractures or dislocation found on xray. Take fluid pill, continue to elevate, and minimize salt intake. Swelling will be a chronic problem since she is non-mobile and mostly wheelchair bound

## 2015-04-12 ENCOUNTER — Ambulatory Visit (HOSPITAL_BASED_OUTPATIENT_CLINIC_OR_DEPARTMENT_OTHER): Payer: Medicaid Other | Admitting: Physical Medicine & Rehabilitation

## 2015-04-12 ENCOUNTER — Encounter: Payer: Self-pay | Admitting: Physical Medicine & Rehabilitation

## 2015-04-12 VITALS — BP 130/77 | HR 64 | Resp 14

## 2015-04-12 DIAGNOSIS — G8110 Spastic hemiplegia affecting unspecified side: Secondary | ICD-10-CM

## 2015-04-12 DIAGNOSIS — I639 Cerebral infarction, unspecified: Secondary | ICD-10-CM | POA: Diagnosis not present

## 2015-04-12 DIAGNOSIS — E119 Type 2 diabetes mellitus without complications: Secondary | ICD-10-CM | POA: Diagnosis not present

## 2015-04-12 DIAGNOSIS — F329 Major depressive disorder, single episode, unspecified: Secondary | ICD-10-CM | POA: Diagnosis not present

## 2015-04-12 DIAGNOSIS — F319 Bipolar disorder, unspecified: Secondary | ICD-10-CM | POA: Diagnosis not present

## 2015-04-12 DIAGNOSIS — I10 Essential (primary) hypertension: Secondary | ICD-10-CM | POA: Diagnosis not present

## 2015-04-12 DIAGNOSIS — R482 Apraxia: Secondary | ICD-10-CM | POA: Diagnosis not present

## 2015-04-12 DIAGNOSIS — I69320 Aphasia following cerebral infarction: Secondary | ICD-10-CM | POA: Diagnosis not present

## 2015-04-12 DIAGNOSIS — M7501 Adhesive capsulitis of right shoulder: Secondary | ICD-10-CM | POA: Diagnosis not present

## 2015-04-12 DIAGNOSIS — F209 Schizophrenia, unspecified: Secondary | ICD-10-CM | POA: Diagnosis not present

## 2015-04-12 DIAGNOSIS — G8190 Hemiplegia, unspecified affecting unspecified side: Secondary | ICD-10-CM | POA: Diagnosis not present

## 2015-04-12 DIAGNOSIS — IMO0002 Reserved for concepts with insufficient information to code with codable children: Secondary | ICD-10-CM

## 2015-04-12 NOTE — Patient Instructions (Signed)
We will repeat the same Botox dose next time. If he would like more we can increase the amount to the finger flexor muscles

## 2015-04-12 NOTE — Progress Notes (Signed)
Subjective:    Patient ID: [name], female    DOB: 1960-05-17, 55 y.o.   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 Biceps50 FCR25 FCU25 FDS25 FDP25 FPL25 Brachiorad25  Patient had no post procedure complications. The patient is a phasic. The daughter is with her today who stays with her. The daughter states that she has noted a big improvement in terms of the clenching of the right fist and bending of the right elbow.    Pain Inventory Average Pain 7 Pain Right Now 8 My pain is constant, sharp, dull and aching  In the last 24 hours, has pain interfered with the following? General activity 6 Relation with others 5 Enjoyment of life 5 What TIME of day is your pain at its worst? morning, daytime, evening, night Sleep (in general) Fair  Pain is worse with: walking, bending and standing Pain improves with: rest, therapy/exercise and medication Relief from Meds: 4  Mobility walk with assistance use a walker ability to climb steps?  no do you drive?  no use a wheelchair needs help with transfers Do you have any goals in this area?  yes  Function disabled: date disabled 05/27/2013 I need assistance with the following:  feeding, dressing, bathing, toileting, meal prep, household duties and shopping Do you have any goals in this area?  yes  Neuro/Psych weakness numbness tingling trouble walking spasms dizziness confusion  Prior Studies Any changes since last visit?  no x-rays CT/MRI  Physicians involved in your care Any changes since last visit?  no   Family History  Problem Relation Age of Onset  . Hypertension Mother   . Stroke Father   . Hypertension Father   . Diabetes Father    Social History   Social History  . Marital Status: Single    Spouse Name: N/A  . Number of Children: 3  .  Years of Education: 12   Occupational History  . disabled    Social History Main Topics  . Smoking status: Never Smoker   . Smokeless tobacco: Never Used  . Alcohol Use: No  . Drug Use: No  . Sexual Activity: Not Asked   Other Topics Concern  . None   Social History Narrative   Patient is single with 3 children.   Patient is right handed.   Patient has hs education.   Patient does not drink caffeine.   Past Surgical History  Procedure Laterality Date  . Cesarean section  ~ 1987  . Tubal ligation    . Tee without cardioversion N/A 12/08/2013    Procedure: TRANSESOPHAGEAL ECHOCARDIOGRAM (TEE);  Surgeon: Candee Furbish, MD;  Location: [facility] ENDOSCOPY;  Service: Cardiovascular;  Laterality: N/A;   Past Medical History  Diagnosis Date  . Hypertension   . Depression   . Diabetes mellitus ([place])     ?type (05/29/2013)  . Bipolar affective disorder ([place])   . Schizophrenia ([place])     [name] 11/28/2007 (05/29/2013)  . Stroke ([place]) 05/27/2013   BP 130/77 mmHg  Pulse 64  Resp 14  Opioid Risk Score:   Fall Risk Score:  `1  Depression screen PHQ 2/9  Depression screen [facility] 2/9 04/07/2015 03/04/2015 12/11/2014 12/11/2014 10/20/2014 06/04/2014 08/22/2013  Decreased Interest 2 0 1 3 (No Data) 1 0  Down, Depressed, Hopeless 2 0 2 3 (No Data) 1 0  PHQ - 2 Score 4 0 3 6 - 2 0  Altered sleeping 2 - 2 - - 1 -  Tired, decreased energy 2 - 1 - - 2 -  Change in appetite 2 - 1 - - 2 -  Feeling bad or failure about yourself  1 - 0 - - 0 -  Trouble concentrating 1 - 1 - - 2 -  Moving slowly or fidgety/restless 0 - 1 - - 0 -  Suicidal thoughts 0 - 0 - - 0 -  PHQ-9 Score 12 - 9 - - 9 -  Difficult doing work/chores - - Somewhat difficult - - - -     Review of Systems  Constitutional: Positive for unexpected weight change.  Respiratory: Positive for cough.   Cardiovascular:       Limb swelling  Musculoskeletal: Positive for gait problem.  Neurological: Positive for dizziness, weakness and numbness.         Tingling spasms  Psychiatric/Behavioral: Positive for confusion.  All other systems reviewed and are negative.      Objective:   Physical Exam MAS 2 at the thumb flexor as well as the DIP flexors of digits 2 and 3 Otherwise MAS 1 in the right hand as well as right elbow flexors.  Motor strength is trace at the deltoid biceps and 0 at the hand flexors and extensors.  Patient is wheelchair bound  A phasic       Assessment & Plan:  1. 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 She's had good results with Botox at doses listed above. She would benefit from additional 25 units at the right FPL, right FDP and right FDS . At this point it is difficult to say whether or not the patient wants additional injections or not. We will discuss this at the time of injection once again.

## 2015-04-13 ENCOUNTER — Telehealth: Payer: Self-pay

## 2015-04-13 NOTE — Telephone Encounter (Signed)
Patient is only to receive 3 pills. Should only take for a couple of days to remove BLE edema

## 2015-04-13 NOTE — Telephone Encounter (Signed)
[name] with [facility] is calling to clarify prescription for furosemide.  Prescription is written for 3 pills. Nurse requested [name] to fill for the 3 pills as prescribed. Nurse will send message to provider to clarify if patient needs 3 or 30.

## 2015-04-14 NOTE — Telephone Encounter (Signed)
Nurse called [facility] to clarify lasix prescription. [facility] is aware to fill prescription for 3 pills only.

## 2015-04-19 ENCOUNTER — Ambulatory Visit (INDEPENDENT_AMBULATORY_CARE_PROVIDER_SITE_OTHER): Payer: Medicaid Other | Admitting: Sports Medicine

## 2015-04-19 ENCOUNTER — Encounter: Payer: Self-pay | Admitting: Sports Medicine

## 2015-04-19 VITALS — BP 118/84 | HR 66 | Resp 14

## 2015-04-19 DIAGNOSIS — M79675 Pain in left toe(s): Secondary | ICD-10-CM

## 2015-04-19 DIAGNOSIS — E1142 Type 2 diabetes mellitus with diabetic polyneuropathy: Secondary | ICD-10-CM | POA: Diagnosis not present

## 2015-04-19 DIAGNOSIS — M79674 Pain in right toe(s): Secondary | ICD-10-CM

## 2015-04-19 DIAGNOSIS — M79676 Pain in unspecified toe(s): Secondary | ICD-10-CM | POA: Diagnosis not present

## 2015-04-19 DIAGNOSIS — B353 Tinea pedis: Secondary | ICD-10-CM

## 2015-04-19 DIAGNOSIS — B351 Tinea unguium: Secondary | ICD-10-CM | POA: Diagnosis not present

## 2015-04-19 NOTE — Progress Notes (Signed)
Patient ID: [name], female   DOB: Jul 21, 1960, 55 y.o.   MRN: [id number] Subjective: LELSIE MACKRELL is a 55 y.o. female patient with history of type 2 diabetes who presents to office today complaining of long, painful nails; unable to trim. Patient states that the glucose reading this morning was around 90mg /dl. Patient is assisted by son at this visit who states that his sister normally helps with his mom and keeps track of her meds/blood sugar. Patient's son denies any new changes in medication or new problems. Patient denies any new cramping, numbness, burning or tingling in the legs.  Patient Active Problem List   Diagnosis Date Noted  . Urine leukocytes 01/08/2015  . Expressive aphasia 01/08/2015  . Malnutrition of moderate degree ([place]) 11/11/2014  . Syncope 11/08/2014  . Dehydration 11/08/2014  . Staring spell 07/09/2014  . Adhesive capsulitis of right shoulder 06/04/2014  . Palpitations 04/02/2014  . Essential hypertension 01/05/2014  . Type 2 diabetes mellitus with other diabetic neurological complication ([place]) [id number]  . Paranoid schizophrenia ([place]) 12/28/2013  . Cerebral embolism with cerebral infarction ([place]) 12/05/2013  . Sinus tachycardia ([place]) 12/05/2013  . 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 Spastic hemiplegia affecting dominant side ([place]) 09/12/2013  . CVA (cerebral infarction) 06/04/2013  . Dysphagia 06/02/2013  . Benign polycythemia 06/02/2013  . Malignant hypertension 05/28/2013  . DM (diabetes mellitus) type II controlled, neurological manifestation ([place]) 05/27/2013  . Acute embolic stroke ([place]) [id number]  . 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 Hypertensive emergency 05/27/2013  . Stroke ([place]) 05/27/2013  . Aphasia complicating stroke [id number]   Current Outpatient Prescriptions on File Prior to Visit  Medication Sig Dispense Refill  .  acetaminophen (TYLENOL) 325 MG tablet Take 1-2 tablets (325-650 mg total) by mouth every 4 (four) hours as needed for mild pain (available over the counter).    Marland Kitchen amLODipine (NORVASC) 10 MG tablet Take 10 mg by mouth.    Marland Kitchen amLODipine (NORVASC) 10 MG tablet TAKE 1 TABLET BY MOUTH EVERY DAY FOR BLOOD PRESSURE 90 tablet 0  . aspirin 325 MG tablet Take 325 mg by mouth daily.    . baclofen (LIORESAL) 10 MG tablet TAKE 1 TABLET BY MOUTH TWO TIMES DAILY TO HELP WITH TONE/SPASMS 60 tablet 0  . feeding supplement, ENSURE ENLIVE, (ENSURE ENLIVE) LIQD Take 237 mLs by mouth 2 (two) times daily between meals.    . fluticasone (FLONASE) 50 MCG/ACT nasal spray Place 2 sprays into both nostrils daily. 16 g 6  . furosemide (LASIX) 20 MG tablet Take 1 tablet (20 mg total) by mouth daily. 3 tablet 0  . gabapentin (NEURONTIN) 100 MG capsule TAKE 1 CAPSULE BY MOUTH THREE TIMES DAILY 90 capsule 2  . glycerin adult (GLYCERIN ADULT) 2 G SUPP Place 1 suppository rectally once as needed for moderate constipation. 10 suppository 0  . hydrochlorothiazide (HYDRODIURIL) 25 MG tablet TAKE 1 TABLET BY MOUTH EVERY DAY 30 tablet 3  . Insulin Glargine (LANTUS SOLOSTAR) 100 UNIT/ML Solostar Pen Inject 10 Units into the skin daily at 10 pm. Once DAILY 5 pen 11  . metoprolol (LOPRESSOR) 100 MG tablet TAKE 1 TABLET BY MOUTH THREE TIMES DAILY 90 tablet 1  . polyethylene glycol powder (GLYCOLAX/MIRALAX) powder Take 17 g by mouth daily as needed (constipation). 527 g 3  . SENNA LAX 8.6 MG tablet TAKE 1 TABLET BY MOUTH EVERY DAY AS NEEDED FOR CONSTIPATION 30 tablet  2  . simvastatin (ZOCOR) 20 MG tablet TAKE 1 TABLET BY MOUTH DAILY AT 6 PM 30 tablet 1  . UNIFINE PENTIPS 31G X 6 MM MISC USE AS PRESCRIBED BY PHYSICIAN 100 each 11   No current facility-administered medications on file prior to visit.   No Known Allergies   Objective: General: Patient is awake, alert, and oriented x 3 and in no acute distress.  Integument: Skin is warm,  dry and supple bilateral. Nails are tender, long, thickened and  dystrophic with subungual debris, consistent with onychomycosis, 1-5 bilateral. + mild interdigital macerations 1-4 bilateral, No signs of infection. No open lesions or preulcerative lesions present bilateral. Remaining integument unremarkable.  Vasculature:  Dorsalis Pedis pulse 0/4 bilateral. Posterior Tibial pulse  1/4 bilateral.  Capillary fill time <3 sec 1-5 bilateral. No hair growth to the level of the digits. Temperature gradient within normal limits. No varicosities present bilateral. +1 pitting edema present bilateral.   Neurology: The patient has diminished sensation measured with a 5.07/10g Semmes Weinstein Monofilament at toes bilateral . Vibratory sensation diminished bilateral with tuning fork. No Babinski sign present bilateral.   Musculoskeletal: No gross pedal deformities noted bilateral. Muscular strength 4/5 in all lower extremity muscular groups on left, 3/5 on right without pain on range of motion; Stroke with right sided weakness. No tenderness with calf compression bilateral.  Assessment and Plan: Problem List Items Addressed This Visit    None    Visit Diagnoses    Dermatophytosis of nail    -  Primary    Tinea pedis of both feet        Pain in toes of both feet        Diabetic polyneuropathy associated with type 2 diabetes mellitus ([place])          -Examined patient. -Discussed and educated patient on diabetic foot care, especially with  regards to the vascular, neurological and musculoskeletal systems.  -Stressed the importance of good glycemic control and the detriment of not  controlling glucose levels in relation to the foot. -Mechanically debrided all nails 1-5 bilateral using sterile nail nipper and filed with dremel without incident  -Applied Castellani paint into all interspaces; Advised patient and son to make sure clean and dry well between toes and to try Gold bond foot powder  -Answered  all patient questions -Patient to return in 3 months for at risk foot care -Patient advised to call the office if any problems or questions arise in the meantime.  Landis Martins, DPM

## 2015-04-19 NOTE — Patient Instructions (Signed)
Diabetes and Foot Care Diabetes may cause you to have problems because of poor blood supply (circulation) to your feet and legs. This may cause the skin on your feet to become thinner, break easier, and heal more slowly. Your skin may become dry, and the skin may peel and crack. You may also have nerve damage in your legs and feet causing decreased feeling in them. You may not notice minor injuries to your feet that could lead to infections or more serious problems. Taking care of your feet is one of the most important things you can do for yourself.  HOME CARE INSTRUCTIONS  Wear shoes at all times, even in the house. Do not go barefoot. Bare feet are easily injured.  Check your feet daily for blisters, cuts, and redness. If you cannot see the bottom of your feet, use a mirror or ask someone for help.  Wash your feet with warm water (do not use hot water) and mild soap. Then pat your feet and the areas between your toes until they are completely dry. Do not soak your feet as this can dry your skin.  Apply a moisturizing lotion or petroleum jelly (that does not contain alcohol and is unscented) to the skin on your feet and to dry, brittle toenails. Do not apply lotion between your toes.  Trim your toenails straight across. Do not dig under them or around the cuticle. File the edges of your nails with an emery board or nail file.  Do not cut corns or calluses or try to remove them with medicine.  Wear clean socks or stockings every day. Make sure they are not too tight. Do not wear knee-high stockings since they may decrease blood flow to your legs.  Wear shoes that fit properly and have enough cushioning. To break in new shoes, wear them for just a few hours a day. This prevents you from injuring your feet. Always look in your shoes before you put them on to be sure there are no objects inside.  Do not cross your legs. This may decrease the blood flow to your feet.  If you find a minor scrape,  cut, or break in the skin on your feet, keep it and the skin around it clean and dry. These areas may be cleansed with mild soap and water. Do not cleanse the area with peroxide, alcohol, or iodine.  When you remove an adhesive bandage, be sure not to damage the skin around it.  If you have a wound, look at it several times a day to make sure it is healing.  Do not use heating pads or hot water bottles. They may burn your skin. If you have lost feeling in your feet or legs, you may not know it is happening until it is too late.  Make sure your health care provider performs a complete foot exam at least annually or more often if you have foot problems. Report any cuts, sores, or bruises to your health care provider immediately. SEEK MEDICAL CARE IF:   You have an injury that is not healing.  You have cuts or breaks in the skin.  You have an ingrown nail.  You notice redness on your legs or feet.  You feel burning or tingling in your legs or feet.  You have pain or cramps in your legs and feet.  Your legs or feet are numb.  Your feet always feel cold. SEEK IMMEDIATE MEDICAL CARE IF:   There is increasing redness,   swelling, or pain in or around a wound.  There is a red line that goes up your leg.  Pus is coming from a wound.  You develop a fever or as directed by your health care provider.  You notice a bad smell coming from an ulcer or wound.   This information is not intended to replace advice given to you by your health care provider. Make sure you discuss any questions you have with your health care provider.   Document Released: 03/03/2000 Document Revised: 11/06/2012 Document Reviewed: 08/13/2012 Elsevier Interactive Patient Education 2016 [employer]  

## 2015-04-21 ENCOUNTER — Other Ambulatory Visit: Payer: Self-pay | Admitting: Internal Medicine

## 2015-04-30 ENCOUNTER — Telehealth: Payer: Self-pay

## 2015-04-30 NOTE — Telephone Encounter (Signed)
OTC Lamisil cream

## 2015-04-30 NOTE — Telephone Encounter (Signed)
Returned patient phone call And spoke with her daughter She is aware she can first try some OTC Lamisil for  The foot fungus

## 2015-04-30 NOTE — Telephone Encounter (Signed)
Patient's daughter called this am Stated when the CMA was doing am care She noticed a fungus between her toes Is there something you can prescribe or is there OTC Medication she can pick up thanks

## 2015-05-13 ENCOUNTER — Ambulatory Visit: Payer: Medicaid Other | Admitting: Internal Medicine

## 2015-05-18 ENCOUNTER — Encounter: Payer: Self-pay | Admitting: Clinical

## 2015-05-18 NOTE — Progress Notes (Signed)
Depression screen [facility] 2/9 04/07/2015 03/04/2015 12/11/2014 12/11/2014 10/20/2014  Decreased Interest 2 0 1 3 (No Data)  Down, Depressed, Hopeless 2 0 2 3 (No Data)  PHQ - 2 Score 4 0 3 6 -  Altered sleeping 2 - 2 - -  Tired, decreased energy 2 - 1 - -  Change in appetite 2 - 1 - -  Feeling bad or failure about yourself  1 - 0 - -  Trouble concentrating 1 - 1 - -  Moving slowly or fidgety/restless 0 - 1 - -  Suicidal thoughts 0 - 0 - -  PHQ-9 Score 12 - 9 - -  Difficult doing work/chores - - Somewhat difficult - -    GAD 7 : Generalized Anxiety Score 04/07/2015  Nervous, Anxious, on Edge 2  Control/stop worrying 0  Worry too much - different things 1  Trouble relaxing 2  Restless 1  Easily annoyed or irritable 3  Afraid - awful might happen 0  Total GAD 7 Score 9

## 2015-05-21 ENCOUNTER — Other Ambulatory Visit: Payer: Self-pay | Admitting: Internal Medicine

## 2015-05-24 ENCOUNTER — Ambulatory Visit: Payer: Medicaid Other | Admitting: Physical Medicine & Rehabilitation

## 2015-05-31 ENCOUNTER — Ambulatory Visit
Admission: RE | Admit: 2015-05-31 | Discharge: 2015-05-31 | Disposition: A | Discharge status: Home / Self Care | Payer: Medicaid Other | Source: Ambulatory Visit

## 2015-05-31 DIAGNOSIS — Z1231 Encounter for screening mammogram for malignant neoplasm of breast: Secondary | ICD-10-CM

## 2015-06-03 ENCOUNTER — Ambulatory Visit: Payer: Medicaid Other | Admitting: Physical Medicine & Rehabilitation

## 2015-06-04 ENCOUNTER — Ambulatory Visit (HOSPITAL_BASED_OUTPATIENT_CLINIC_OR_DEPARTMENT_OTHER): Payer: Medicaid Other | Admitting: Physical Medicine & Rehabilitation

## 2015-06-04 ENCOUNTER — Encounter: Payer: Self-pay | Admitting: Physical Medicine & Rehabilitation

## 2015-06-04 ENCOUNTER — Encounter: Payer: Medicaid Other | Attending: Physical Medicine & Rehabilitation

## 2015-06-04 VITALS — BP 134/82 | HR 62 | Resp 14

## 2015-06-04 DIAGNOSIS — F209 Schizophrenia, unspecified: Secondary | ICD-10-CM | POA: Insufficient documentation

## 2015-06-04 DIAGNOSIS — F329 Major depressive disorder, single episode, unspecified: Secondary | ICD-10-CM | POA: Diagnosis not present

## 2015-06-04 DIAGNOSIS — G8190 Hemiplegia, unspecified affecting unspecified side: Secondary | ICD-10-CM | POA: Diagnosis not present

## 2015-06-04 DIAGNOSIS — I69320 Aphasia following cerebral infarction: Secondary | ICD-10-CM | POA: Insufficient documentation

## 2015-06-04 DIAGNOSIS — F319 Bipolar disorder, unspecified: Secondary | ICD-10-CM | POA: Diagnosis not present

## 2015-06-04 DIAGNOSIS — E119 Type 2 diabetes mellitus without complications: Secondary | ICD-10-CM | POA: Insufficient documentation

## 2015-06-04 DIAGNOSIS — G8110 Spastic hemiplegia affecting unspecified side: Secondary | ICD-10-CM

## 2015-06-04 DIAGNOSIS — I639 Cerebral infarction, unspecified: Secondary | ICD-10-CM | POA: Insufficient documentation

## 2015-06-04 DIAGNOSIS — I10 Essential (primary) hypertension: Secondary | ICD-10-CM | POA: Diagnosis not present

## 2015-06-04 DIAGNOSIS — M7501 Adhesive capsulitis of right shoulder: Secondary | ICD-10-CM | POA: Diagnosis not present

## 2015-06-04 DIAGNOSIS — R482 Apraxia: Secondary | ICD-10-CM | POA: Diagnosis not present

## 2015-06-04 NOTE — Patient Instructions (Signed)

## 2015-06-04 NOTE — Progress Notes (Signed)
Botox Injection for spasticity using needle EMG guidance  Dilution: 50 Units/ml Indication: Severe spasticity which interferes with ADL,mobility and/or  hygiene and is unresponsive to medication management and other conservative care Informed consent was obtained after describing risks and benefits of the procedure with the patient. This includes bleeding, bruising, infection, excessive weakness, or medication side effects. A REMS form is on file and signed. Needle: 27g 1" needle electrode Number of units per muscle Pectoralis0 Biceps50 FCR25 FCU25 FDS25 FDP25 FPL25 Brachiorad25  All injections were done after obtaining appropriate EMG activity and after negative drawback for blood. The patient tolerated the procedure well. Post procedure instructions were given. A followup appointment was made.

## 2015-06-07 ENCOUNTER — Telehealth: Payer: Self-pay

## 2015-06-07 NOTE — Telephone Encounter (Signed)
Returned daughter [name] phone call Daughter states her mom has been very congested Coughing and not feeling well Patient should be evaluated in the office Call transferred to schedule and appointment

## 2015-06-08 ENCOUNTER — Telehealth: Payer: Self-pay

## 2015-06-08 NOTE — Telephone Encounter (Signed)
Spoke with daughter demetri and she is aware of her moms normal Mammogram results

## 2015-06-08 NOTE — Telephone Encounter (Signed)
-----   Message from Lance Bosch, NP sent at 06/08/2015  2:34 PM EDT ----- Normal mammogram

## 2015-06-15 ENCOUNTER — Emergency Department (HOSPITAL_COMMUNITY): Payer: Medicaid Other

## 2015-06-15 ENCOUNTER — Encounter (HOSPITAL_COMMUNITY): Payer: Self-pay | Admitting: Emergency Medicine

## 2015-06-15 ENCOUNTER — Emergency Department (HOSPITAL_COMMUNITY)
Admission: EM | Admit: 2015-06-15 | Discharge: 2015-06-15 | Disposition: A | Discharge status: Home / Self Care | Payer: Medicaid Other | Attending: Emergency Medicine | Admitting: Emergency Medicine

## 2015-06-15 DIAGNOSIS — Z794 Long term (current) use of insulin: Secondary | ICD-10-CM | POA: Diagnosis not present

## 2015-06-15 DIAGNOSIS — J9801 Acute bronchospasm: Secondary | ICD-10-CM | POA: Insufficient documentation

## 2015-06-15 DIAGNOSIS — R05 Cough: Secondary | ICD-10-CM | POA: Diagnosis present

## 2015-06-15 DIAGNOSIS — Z8673 Personal history of transient ischemic attack (TIA), and cerebral infarction without residual deficits: Secondary | ICD-10-CM | POA: Insufficient documentation

## 2015-06-15 DIAGNOSIS — E119 Type 2 diabetes mellitus without complications: Secondary | ICD-10-CM | POA: Diagnosis not present

## 2015-06-15 DIAGNOSIS — I10 Essential (primary) hypertension: Secondary | ICD-10-CM | POA: Insufficient documentation

## 2015-06-15 DIAGNOSIS — Z79899 Other long term (current) drug therapy: Secondary | ICD-10-CM | POA: Insufficient documentation

## 2015-06-15 DIAGNOSIS — R059 Cough, unspecified: Secondary | ICD-10-CM

## 2015-06-15 DIAGNOSIS — F319 Bipolar disorder, unspecified: Secondary | ICD-10-CM | POA: Diagnosis not present

## 2015-06-15 DIAGNOSIS — Z7982 Long term (current) use of aspirin: Secondary | ICD-10-CM | POA: Insufficient documentation

## 2015-06-15 HISTORY — DX: Reserved for concepts with insufficient information to code with codable children: IMO0002

## 2015-06-15 HISTORY — DX: Hemiplegia and hemiparesis following cerebral infarction affecting unspecified side: I69.359

## 2015-06-15 HISTORY — DX: Disorientation, unspecified: R41.0

## 2015-06-15 LAB — URINALYSIS, ROUTINE W REFLEX MICROSCOPIC
Bilirubin Urine: NEGATIVE
GLUCOSE, UA: NEGATIVE mg/dL
HGB URINE DIPSTICK: NEGATIVE
KETONES UR: NEGATIVE mg/dL
LEUKOCYTES UA: NEGATIVE
Nitrite: NEGATIVE
PROTEIN: NEGATIVE mg/dL
Specific Gravity, Urine: 1.015 (ref 1.005–1.030)
pH: 5.5 (ref 5.0–8.0)

## 2015-06-15 LAB — CBC WITH DIFFERENTIAL/PLATELET
BASOS ABS: 0 10*3/uL (ref 0.0–0.1)
BASOS PCT: 0 %
EOS ABS: 0.1 10*3/uL (ref 0.0–0.7)
EOS PCT: 2 %
HCT: 43.7 % (ref 36.0–46.0)
Hemoglobin: 14.9 g/dL (ref 12.0–15.0)
Lymphocytes Relative: 48 %
Lymphs Abs: 3 10*3/uL (ref 0.7–4.0)
MCH: 30.3 pg (ref 26.0–34.0)
MCHC: 34.1 g/dL (ref 30.0–36.0)
MCV: 88.8 fL (ref 78.0–100.0)
MONO ABS: 0.2 10*3/uL (ref 0.1–1.0)
Monocytes Relative: 3 %
Neutro Abs: 3 10*3/uL (ref 1.7–7.7)
Neutrophils Relative %: 47 %
PLATELETS: 270 10*3/uL (ref 150–400)
RBC: 4.92 MIL/uL (ref 3.87–5.11)
RDW: 11.9 % (ref 11.5–15.5)
WBC: 6.3 10*3/uL (ref 4.0–10.5)

## 2015-06-15 LAB — BASIC METABOLIC PANEL
ANION GAP: 10 (ref 5–15)
BUN: 19 mg/dL (ref 6–20)
CALCIUM: 10.5 mg/dL — AB (ref 8.9–10.3)
CO2: 30 mmol/L (ref 22–32)
CREATININE: 0.77 mg/dL (ref 0.44–1.00)
Chloride: 105 mmol/L (ref 101–111)
GLUCOSE: 84 mg/dL (ref 65–99)
Potassium: 3.7 mmol/L (ref 3.5–5.1)
Sodium: 145 mmol/L (ref 135–145)

## 2015-06-15 LAB — I-STAT CG4 LACTIC ACID, ED: LACTIC ACID, VENOUS: 1.36 mmol/L (ref 0.5–2.0)

## 2015-06-15 MED ORDER — ALBUTEROL SULFATE HFA 108 (90 BASE) MCG/ACT IN AERS: 2.0000 | INHALATION_SPRAY | RESPIRATORY_TRACT | Status: DC | PRN

## 2015-06-15 NOTE — ED Notes (Signed)
Per EMS, pt from home.  Pt c/o congestion/cough x 2 weeks.  Pt has hx of stroke with deficit on right side.  Pt can ambulate with walker.  No n/v.  ? Low grade fever at 100.  No c/o pain.  Vitals 148/96, hr 72, resp 20, cbg 173, 98% ra.

## 2015-06-15 NOTE — ED Notes (Signed)
Per family member who last saw patient normal. She states at 0930 this morning patient was at her baseline, able to give yes/no answers and doing her usual exercises. Says she received a call from the patient's aid saying she was wheezing and appeared to be unable to catch her breath when they called EMS. Patient able to follow commands and has use of her left side in triage. Able to open her mouth, groans and shakes her head no when asked if she can speak at all. Lung sounds diminished but clear, no wheezing audible, vitals stable.

## 2015-06-15 NOTE — ED Notes (Signed)
Pt O2 was 96-100 while ambulating

## 2015-06-15 NOTE — Discharge Instructions (Signed)
Take your usual prescriptions as previously directed.  Use your albuterol inhaler as prescribed.  Call your regular medical doctor tomorrow morning to confirm your previously scheduled follow up appointment on Friday.  Return to the Emergency Department immediately sooner if worsening.

## 2015-06-15 NOTE — ED Provider Notes (Signed)
CSN: [id number]     Arrival date & time 06/15/15  1239 History   First MD Initiated Contact with Patient 06/15/15 1508     Chief Complaint  Patient presents with  . Wheezing  . Cough      Patient is a 55 y.o. female presenting with wheezing and cough. The history is provided by a relative, a caregiver and the patient. The history is limited by the condition of the patient (Hx aphasia).  Wheezing Associated symptoms: cough   Cough Associated symptoms: wheezing   Pt was seen at 1510. Per pt's family: Pt's family states pt has had "cough" and "congestion" for the past several weeks. Approximately 1130 today, pt's home aide called pt's family and told them pt was "SOB" and "wheezing." Family states pt did vomit while eating breakfast approximately 0900 this morning, which is not unusual for her ("not swallowing her food all that good" since CVA). Pt herself denies any complaints of CP, SOB, abd pain, nausea. No reported fevers, no diarrhea, no black or blood in stools or emesis, no apnea, no syncope.     Past Medical History  Diagnosis Date  . Hypertension   . Depression   . Diabetes mellitus ([place])     ?type (05/29/2013)  . Bipolar affective disorder ([place])   . Schizophrenia ([place])     [name] 11/28/2007 (05/29/2013)  . Stroke ([place]) 05/27/2013  . 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 Aphasia due to stroke   . Episodic confusion    Past Surgical History  Procedure Laterality Date  . Cesarean section  ~ 1987  . Tubal ligation    . Tee without cardioversion N/A 12/08/2013    Procedure: TRANSESOPHAGEAL ECHOCARDIOGRAM (TEE);  Surgeon: Candee Furbish, MD;  Location: [facility] ENDOSCOPY;  Service: Cardiovascular;  Laterality: N/A;   Family History  Problem Relation Age of Onset  . Hypertension Mother   . Stroke Father   . Hypertension Father   . Diabetes Father    Social History  Substance Use Topics  . Smoking status: Never Smoker   . Smokeless tobacco: Never  Used  . Alcohol Use: No    Review of Systems  Unable to perform ROS: Patient nonverbal  Respiratory: Positive for cough and wheezing.       Allergies  Review of patient's allergies indicates no known allergies.  Home Medications   Prior to Admission medications   Medication Sig Start Date End Date Taking? Authorizing Provider  amLODipine (NORVASC) 10 MG tablet TAKE 1 TABLET BY MOUTH EVERY DAY FOR BLOOD PRESSURE 03/16/15  Yes Lance Bosch, NP  aspirin 325 MG tablet Take 325 mg by mouth daily.   Yes Historical Provider, MD  baclofen (LIORESAL) 10 MG tablet TAKE 1 TABLET BY MOUTH TWO TIMES DAILY TO HELP WITH TONE/SPASMS 05/24/15  Yes Lance Bosch, NP  fluticasone (FLONASE) 50 MCG/ACT nasal spray Place 2 sprays into both nostrils daily. Patient taking differently: Place 2 sprays into both nostrils daily as needed for allergies.  03/04/15  Yes Lance Bosch, NP  gabapentin (NEURONTIN) 100 MG capsule TAKE 1 CAPSULE BY MOUTH THREE TIMES DAILY 03/25/15  Yes Lance Bosch, NP  hydrochlorothiazide (HYDRODIURIL) 25 MG tablet TAKE 1 TABLET BY MOUTH EVERY DAY 05/24/15  Yes Lance Bosch, NP  Insulin Glargine (LANTUS SOLOSTAR) 100 UNIT/ML Solostar Pen Inject 10 Units into the skin daily at 10 pm. Once DAILY Patient taking differently: Inject 10 Units into the  skin daily as needed (for blood sugar).  12/17/14  Yes Lance Bosch, NP  metoprolol (LOPRESSOR) 100 MG tablet TAKE 1 TABLET BY MOUTH THREE TIMES DAILY 05/24/15  Yes Lance Bosch, NP  polyethylene glycol powder (GLYCOLAX/MIRALAX) powder Take 17 g by mouth daily as needed (constipation). 02/22/15  Yes Lance Bosch, NP  protein supplement shake (PREMIER PROTEIN) LIQD Take 2 oz by mouth daily.   Yes Historical Provider, MD  Senna (CORRECTOL HERBAL TEA PO) Take 1 packet by mouth daily as needed (for constipation).   Yes Historical Provider, MD  SENNA LAX 8.6 MG tablet TAKE 1 TABLET BY MOUTH EVERY DAY AS NEEDED FOR CONSTIPATION 03/31/15  Yes  Lance Bosch, NP  simvastatin (ZOCOR) 20 MG tablet TAKE 1 TABLET BY MOUTH EVERY DAY AT 6PM 04/21/15  Yes Lance Bosch, NP  UNIFINE PENTIPS 31G X 6 MM MISC USE AS PRESCRIBED BY PHYSICIAN 03/16/15  Yes Lance Bosch, NP  acetaminophen (TYLENOL) 325 MG tablet Take 1-2 tablets (325-650 mg total) by mouth every 4 (four) hours as needed for mild pain (available over the counter). Patient not taking: Reported on 06/15/2015 07/08/13   Ivan Anchors Love, PA-C  feeding supplement, ENSURE ENLIVE, (ENSURE ENLIVE) LIQD Take 237 mLs by mouth 2 (two) times daily between meals. Patient not taking: Reported on 06/15/2015 11/11/14   Jonetta Osgood, MD  furosemide (LASIX) 20 MG tablet Take 1 tablet (20 mg total) by mouth daily. Patient not taking: Reported on 06/15/2015 04/07/15   Lance Bosch, NP   BP 153/94 mmHg  Pulse 64  Temp(Src) 98.3 F (36.8 C) (Oral)  Resp 15  SpO2 98%   16:46 Orthostatic Vital Signs AH  Orthostatic Lying  - BP- Lying: 153/86 mmHg ; Pulse- Lying: 77  Orthostatic Sitting - BP- Sitting: 150/95 mmHg ; Pulse- Sitting: 74  Orthostatic Standing at 0 minutes - BP- Standing at 0 minutes: 138/89 mmHg ; Pulse- Standing at 0 minutes: 88       Physical Exam  1515: Physical examination:  Nursing notes reviewed; Vital signs and O2 SAT reviewed;  Constitutional: Well developed, Well nourished, Well hydrated, In no acute distress; Head:  Normocephalic, atraumatic; Eyes: EOMI, PERRL, No scleral icterus; ENMT: Mouth and pharynx normal, Mucous membranes moist; Neck: Supple, Full range of motion, No lymphadenopathy; Cardiovascular: Regular rate and rhythm, No gallop; Respiratory: Breath sounds clear & equal bilaterally, No wheezes. No drooling, no stridor. Normal respiratory effort/excursion; Chest: Nontender, Movement normal; Abdomen: Soft, Nontender, Nondistended, Normal bowel sounds; Genitourinary: No CVA tenderness; Extremities: Pulses normal, No tenderness, No edema, No calf edema or asymmetry.;  Neuro: Awake, alert. +right facial droop, aphasia, right sided weakness per hx. Nods head yes/no to questions per baseline..; Skin: Color normal, Warm, Dry.   ED Course  Procedures (including critical care time) Labs Review  Imaging Review  I have personally reviewed and evaluated these images and lab results as part of my medical decision-making.   EKG Interpretation None      MDM  MDM Reviewed: previous chart, nursing note and vitals Reviewed previous: labs Interpretation: labs and x-ray     Results for orders placed or performed during the hospital encounter of 06/15/15  Urinalysis, Routine w reflex microscopic  Result Value Ref Range   Color, Urine YELLOW YELLOW   APPearance CLEAR CLEAR   Specific Gravity, Urine 1.015 1.005 - 1.030   pH 5.5 5.0 - 8.0   Glucose, UA NEGATIVE NEGATIVE mg/dL   Hgb urine  dipstick NEGATIVE NEGATIVE   Bilirubin Urine NEGATIVE NEGATIVE   Ketones, ur NEGATIVE NEGATIVE mg/dL   Protein, ur NEGATIVE NEGATIVE mg/dL   Nitrite NEGATIVE NEGATIVE   Leukocytes, UA NEGATIVE NEGATIVE  Basic metabolic panel  Result Value Ref Range   Sodium 145 135 - 145 mmol/L   Potassium 3.7 3.5 - 5.1 mmol/L   Chloride 105 101 - 111 mmol/L   CO2 30 22 - 32 mmol/L   Glucose, Bld 84 65 - 99 mg/dL   BUN 19 6 - 20 mg/dL   Creatinine, Ser 0.77 0.44 - 1.00 mg/dL   Calcium 10.5 (H) 8.9 - 10.3 mg/dL   GFR calc non Af Amer >60 >60 mL/min   GFR calc Af Amer >60 >60 mL/min   Anion gap 10 5 - 15  CBC with Differential  Result Value Ref Range   WBC 6.3 4.0 - 10.5 K/uL   RBC 4.92 3.87 - 5.11 MIL/uL   Hemoglobin 14.9 12.0 - 15.0 g/dL   HCT 43.7 36.0 - 46.0 %   MCV 88.8 78.0 - 100.0 fL   MCH 30.3 26.0 - 34.0 pg   MCHC 34.1 30.0 - 36.0 g/dL   RDW 11.9 11.5 - 15.5 %   Platelets 270 150 - 400 K/uL   Neutrophils Relative % 47 %   Neutro Abs 3.0 1.7 - 7.7 K/uL   Lymphocytes Relative 48 %   Lymphs Abs 3.0 0.7 - 4.0 K/uL   Monocytes Relative 3 %   Monocytes Absolute 0.2  0.1 - 1.0 K/uL   Eosinophils Relative 2 %   Eosinophils Absolute 0.1 0.0 - 0.7 K/uL   Basophils Relative 0 %   Basophils Absolute 0.0 0.0 - 0.1 K/uL  I-Stat CG4 Lactic Acid, ED  Result Value Ref Range   Lactic Acid, Venous 1.36 0.5 - 2.0 mmol/L   Dg Chest 2 View 06/15/2015  CLINICAL DATA:  Cough and shortness of breath for 2 weeks. EXAM: CHEST  2 VIEW COMPARISON:  12/17/2014 FINDINGS: The cardiac silhouette, mediastinal and hilar contours are within normal limits and stable. The lungs are clear. No pleural effusion. The bony thorax is intact. IMPRESSION: No acute cardiopulmonary findings. Electronically Signed   By: Marijo Sanes M.D.   On: 06/15/2015 15:39    1745:  Pt not orthostatic on VS. Pt ambulated with baseline gait, Sats 96-100% R/A, resps easy, NAD. Lungs continue CTA bilat, no wheezing, Sats 100% R/A while in the ED. Denies CP/SOB. Doubt PE as cause for symptoms with low risk Well's. Workup is reassuring, no clear indication for admission at this time. Pt has f/u with PMD in 3 days; strongly encouraged to keep this appt. Dx and testing d/w pt and family.  Questions answered.  Verb understanding, agreeable to d/c home with outpt f/u.    Francine Graven, DO 06/18/15 1558

## 2015-06-15 NOTE — ED Notes (Signed)
Patient sitting in room, in no distress. Members of the family at the bedside and states that the patients CNA told them that she was having some SOB.

## 2015-06-15 NOTE — ED Notes (Signed)
Pt refused rectal temperature 

## 2015-06-17 LAB — URINE CULTURE

## 2015-06-18 ENCOUNTER — Other Ambulatory Visit: Payer: Self-pay

## 2015-06-18 ENCOUNTER — Ambulatory Visit: Payer: Medicaid Other | Attending: Internal Medicine | Admitting: Internal Medicine

## 2015-06-18 ENCOUNTER — Other Ambulatory Visit: Payer: Self-pay | Admitting: Internal Medicine

## 2015-06-18 ENCOUNTER — Encounter: Payer: Self-pay | Admitting: Internal Medicine

## 2015-06-18 VITALS — BP 133/87 | HR 70 | Temp 98.7°F | Resp 16 | Ht 65.0 in | Wt 115.0 lb

## 2015-06-18 DIAGNOSIS — K5900 Constipation, unspecified: Secondary | ICD-10-CM | POA: Insufficient documentation

## 2015-06-18 DIAGNOSIS — Z7982 Long term (current) use of aspirin: Secondary | ICD-10-CM | POA: Diagnosis not present

## 2015-06-18 DIAGNOSIS — Z8673 Personal history of transient ischemic attack (TIA), and cerebral infarction without residual deficits: Secondary | ICD-10-CM | POA: Diagnosis not present

## 2015-06-18 DIAGNOSIS — R059 Cough, unspecified: Secondary | ICD-10-CM

## 2015-06-18 DIAGNOSIS — J069 Acute upper respiratory infection, unspecified: Secondary | ICD-10-CM | POA: Diagnosis not present

## 2015-06-18 DIAGNOSIS — F319 Bipolar disorder, unspecified: Secondary | ICD-10-CM | POA: Diagnosis not present

## 2015-06-18 DIAGNOSIS — F209 Schizophrenia, unspecified: Secondary | ICD-10-CM | POA: Diagnosis not present

## 2015-06-18 DIAGNOSIS — R0602 Shortness of breath: Secondary | ICD-10-CM | POA: Diagnosis not present

## 2015-06-18 DIAGNOSIS — R079 Chest pain, unspecified: Secondary | ICD-10-CM | POA: Insufficient documentation

## 2015-06-18 DIAGNOSIS — E119 Type 2 diabetes mellitus without complications: Secondary | ICD-10-CM | POA: Diagnosis not present

## 2015-06-18 DIAGNOSIS — R062 Wheezing: Secondary | ICD-10-CM | POA: Diagnosis present

## 2015-06-18 DIAGNOSIS — Z794 Long term (current) use of insulin: Secondary | ICD-10-CM | POA: Diagnosis not present

## 2015-06-18 DIAGNOSIS — E1140 Type 2 diabetes mellitus with diabetic neuropathy, unspecified: Secondary | ICD-10-CM

## 2015-06-18 DIAGNOSIS — R05 Cough: Secondary | ICD-10-CM | POA: Diagnosis not present

## 2015-06-18 DIAGNOSIS — I10 Essential (primary) hypertension: Secondary | ICD-10-CM | POA: Insufficient documentation

## 2015-06-18 LAB — GLUCOSE, POCT (MANUAL RESULT ENTRY): POC Glucose: 100 mg/dl — AB (ref 70–99)

## 2015-06-18 LAB — POCT GLYCOSYLATED HEMOGLOBIN (HGB A1C): Hemoglobin A1C: 5.6

## 2015-06-18 MED ORDER — E-Z SPACER DEVI: Status: DC

## 2015-06-18 MED ORDER — SORBITOL 70 % PO SOLN
15.0000 mL | ORAL | Status: DC | PRN
Start: 2015-06-18 — End: 2015-09-29

## 2015-06-18 MED ORDER — AMOXICILLIN-POT CLAVULANATE 875-125 MG PO TABS: 1.0000 | ORAL_TABLET | Freq: Two times a day (BID) | ORAL | Status: DC

## 2015-06-18 NOTE — Progress Notes (Signed)
Patient's here for wheezing and SOB x3days.  Patient daughter reports that Mom is stilling having problems with her breathing with chest pain. Patient spitting up brownish phlegm. Sxs has gotten worse.

## 2015-06-18 NOTE — Progress Notes (Signed)
Patient ID: [name], female   DOB: 17-Mar-1961, 55 y.o.   MRN: [id number]  CC: Wheezing, SOB  HPI: Lannie Aase is a 55 y.o. female here today for a follow up visit.  Patient has past medical history of diabetes, HTN, bipolar disorder, CVA with hemiplegia and aphasia. Patient is present with daughter who is giving history. Patient was seen in ED 3 days ago with symptoms of cough, SOB and was discharged with instructions to treat cough. Patients daughter states that she has noticed her mother wheezing in her sleep. Patient was given albuterol inhaler but has not used it because she does not know how. Daughter denies fever, chills. Some vomiting and rhinitis noticed.  Daughter would like something to help with patients constipation. She gives her Miralax daily but it does not seem to help. Daughter notices mother goes several days without a bowel movement and when she is able to go they are very large and hard stools.   No Known Allergies Past Medical History  Diagnosis Date  . Hypertension   . Depression   . Diabetes mellitus ([place])     ?type (05/29/2013)  . Bipolar affective disorder ([place])   . Schizophrenia ([place])     [name] 11/28/2007 (05/29/2013)  . Stroke ([place]) 05/27/2013  . 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 Aphasia due to stroke   . Episodic confusion    Current Outpatient Prescriptions on File Prior to Visit  Medication Sig Dispense Refill  . albuterol (PROVENTIL HFA;VENTOLIN HFA) 108 (90 Base) MCG/ACT inhaler Inhale 2 puffs into the lungs every 4 (four) hours as needed for wheezing or shortness of breath. 1 Inhaler 0  . amLODipine (NORVASC) 10 MG tablet TAKE 1 TABLET BY MOUTH EVERY DAY FOR BLOOD PRESSURE 90 tablet 0  . aspirin 325 MG tablet Take 325 mg by mouth daily.    . baclofen (LIORESAL) 10 MG tablet TAKE 1 TABLET BY MOUTH TWO TIMES DAILY TO HELP WITH TONE/SPASMS 60 tablet 2  . hydrochlorothiazide (HYDRODIURIL) 25 MG tablet TAKE 1  TABLET BY MOUTH EVERY DAY 30 tablet 2  . Insulin Glargine (LANTUS SOLOSTAR) 100 UNIT/ML Solostar Pen Inject 10 Units into the skin daily at 10 pm. Once DAILY (Patient taking differently: Inject 10 Units into the skin daily as needed (for blood sugar). ) 5 pen 11  . metoprolol (LOPRESSOR) 100 MG tablet TAKE 1 TABLET BY MOUTH THREE TIMES DAILY 90 tablet 2  . polyethylene glycol powder (GLYCOLAX/MIRALAX) powder Take 17 g by mouth daily as needed (constipation). 527 g 3  . protein supplement shake (PREMIER PROTEIN) LIQD Take 2 oz by mouth daily.    . simvastatin (ZOCOR) 20 MG tablet TAKE 1 TABLET BY MOUTH EVERY DAY AT 6PM 30 tablet 2  . UNIFINE PENTIPS 31G X 6 MM MISC USE AS PRESCRIBED BY PHYSICIAN 100 each 11  . acetaminophen (TYLENOL) 325 MG tablet Take 1-2 tablets (325-650 mg total) by mouth every 4 (four) hours as needed for mild pain (available over the counter). (Patient not taking: Reported on 06/15/2015)    . feeding supplement, ENSURE ENLIVE, (ENSURE ENLIVE) LIQD Take 237 mLs by mouth 2 (two) times daily between meals. (Patient not taking: Reported on 06/15/2015)    . fluticasone (FLONASE) 50 MCG/ACT nasal spray Place 2 sprays into both nostrils daily. (Patient not taking: Reported on 06/18/2015) 16 g 6  . furosemide (LASIX) 20 MG tablet Take 1 tablet (20 mg total) by mouth daily. (  Patient not taking: Reported on 06/15/2015) 3 tablet 0  . Senna (CORRECTOL HERBAL TEA PO) Take 1 packet by mouth daily as needed (for constipation).     No current facility-administered medications on file prior to visit.   Family History  Problem Relation Age of Onset  . Hypertension Mother   . Stroke Father   . Hypertension Father   . Diabetes Father    Social History   Social History  . Marital Status: Single    Spouse Name: N/A  . Number of Children: 3  . Years of Education: 12   Occupational History  . disabled    Social History Main Topics  . Smoking status: Never Smoker   . Smokeless tobacco:  Never Used  . Alcohol Use: No  . Drug Use: No  . Sexual Activity: Not on file   Other Topics Concern  . Not on file   Social History Narrative   Patient is single with 3 children.   Patient is right handed.   Patient has hs education.   Patient does not drink caffeine.    Review of Systems: Other than what is stated in HPI, all other systems are negative per daughters report.   Objective:   Filed Vitals:   06/18/15 1215  BP: 133/87  Pulse: 70  Temp: 98.7 F (37.1 C)  Resp: 16    Physical Exam  Constitutional: She is oriented to person, place, and time.  Cardiovascular: Normal rate, regular rhythm and normal heart sounds.   Pulmonary/Chest: Effort normal and breath sounds normal. She has no wheezes. She exhibits no tenderness.  Neurological: She is alert and oriented to person, place, and time.  Skin: Skin is warm and dry.  Psychiatric: She has a normal mood and affect.     Lab Results  Component Value Date   WBC 6.3 06/15/2015   HGB 14.9 06/15/2015   HCT 43.7 06/15/2015   MCV 88.8 06/15/2015   PLT 270 06/15/2015   Lab Results  Component Value Date   CREATININE 0.77 06/15/2015   BUN 19 06/15/2015   NA 145 06/15/2015   K 3.7 06/15/2015   CL 105 06/15/2015   CO2 30 06/15/2015    Lab Results  Component Value Date   HGBA1C 100 06/18/2015   Lipid Panel     Component Value Date/Time   CHOL 105 12/06/2013 0345   TRIG 66 12/06/2013 0345   HDL 42 12/06/2013 0345   CHOLHDL 2.5 12/06/2013 0345   VLDL 13 12/06/2013 0345   LDLCALC 50 12/06/2013 0345       Assessment and plan:   Loreane was seen today for wheezing and shortness of breath.  Diagnoses and all orders for this visit:  Chest pain, unspecified chest pain type -     EKG 12-Lead EKG: normal EKG, normal sinus rhythm. Likely chest pain from cough  Controlled type 2 diabetes mellitus with diabetic neuropathy, without long-term current use of insulin (HCC) -     Glucose (CBG) -     POCT  A1C Patients diabetes is well control as evidence by consistently low a1c.  Patient will continue with current therapy and continue to make necessary lifestyle changes.  Reviewed foot care, diet, exercise, annual health maintenance with patient.   URI (upper respiratory infection) -     amoxicillin-clavulanate (AUGMENTIN) 875-125 MG tablet; Take 1 tablet by mouth 2 (two) times daily. Patient given script as a watch and wait. Daughter given signs and symptoms of infection and  when to treat if needed. They have limited transportation so she may have script to keep if needed.   Cough -     Spacer/Aero-Holding Chambers (E-Z SPACER) inhaler; Use as instructed Spacer given to help patient with albuterol inhaler. Patient does have facial droop and I feel using the inhaler may be slightly difficult for her to get the maximum dose delivered.   Constipation, unspecified constipation type -     sorbitol 70 % solution; Take 15 mLs by mouth as needed. For constipation May stop use Sorbitol as needed for severe constipation if Miralax does not help.       Return in about 3 months (around 09/17/2015).   Lance Bosch, [facility] [phone] 06/18/2015, 12:47 PM'

## 2015-07-16 ENCOUNTER — Encounter: Payer: Medicaid Other | Attending: Physical Medicine & Rehabilitation

## 2015-07-16 ENCOUNTER — Encounter: Payer: Self-pay | Admitting: Physical Medicine & Rehabilitation

## 2015-07-16 ENCOUNTER — Ambulatory Visit (HOSPITAL_BASED_OUTPATIENT_CLINIC_OR_DEPARTMENT_OTHER): Payer: Medicaid Other | Admitting: Physical Medicine & Rehabilitation

## 2015-07-16 VITALS — BP 116/77 | HR 75 | Resp 16

## 2015-07-16 DIAGNOSIS — IMO0002 Reserved for concepts with insufficient information to code with codable children: Secondary | ICD-10-CM

## 2015-07-16 DIAGNOSIS — M7501 Adhesive capsulitis of right shoulder: Secondary | ICD-10-CM

## 2015-07-16 DIAGNOSIS — G8110 Spastic hemiplegia affecting unspecified side: Secondary | ICD-10-CM | POA: Diagnosis not present

## 2015-07-16 DIAGNOSIS — F209 Schizophrenia, unspecified: Secondary | ICD-10-CM | POA: Insufficient documentation

## 2015-07-16 DIAGNOSIS — R482 Apraxia: Secondary | ICD-10-CM | POA: Diagnosis not present

## 2015-07-16 DIAGNOSIS — I69320 Aphasia following cerebral infarction: Secondary | ICD-10-CM

## 2015-07-16 DIAGNOSIS — F319 Bipolar disorder, unspecified: Secondary | ICD-10-CM | POA: Insufficient documentation

## 2015-07-16 DIAGNOSIS — G8190 Hemiplegia, unspecified affecting unspecified side: Secondary | ICD-10-CM | POA: Insufficient documentation

## 2015-07-16 DIAGNOSIS — I10 Essential (primary) hypertension: Secondary | ICD-10-CM | POA: Insufficient documentation

## 2015-07-16 DIAGNOSIS — E119 Type 2 diabetes mellitus without complications: Secondary | ICD-10-CM | POA: Diagnosis not present

## 2015-07-16 DIAGNOSIS — F329 Major depressive disorder, single episode, unspecified: Secondary | ICD-10-CM | POA: Insufficient documentation

## 2015-07-16 DIAGNOSIS — I639 Cerebral infarction, unspecified: Secondary | ICD-10-CM | POA: Diagnosis not present

## 2015-07-16 NOTE — Patient Instructions (Signed)
Please call to schedule Botox I would expect it should wear off in the beginning of June

## 2015-07-16 NOTE — Progress Notes (Signed)
Subjective:    Patient ID: [name], female    DOB: 11-27-1960, 55 y.o.   MRN: [id number] Botox 06/04/15 Biceps50 FCR25 FCU25 FDS25 FDP25 FPL25 Brachiorad25 HPI Patient's Daughter feels like she has had some hand pain since the Botox. We reviewed the injection sites listed above all of which were either in the arm or forearm and nothing in the hand. Patient is Severely aphasic so it is difficult to get a good history. Patient lifts up her right hand but does not say anything. She is inconsistent with yes no responses. Pain Inventory Average Pain 6 Pain Right Now 7 My pain is constant, sharp, stabbing, tingling and aching  In the last 24 hours, has pain interfered with the following? General activity 6 Relation with others 3 Enjoyment of life 8 What TIME of day is your pain at its worst? . Sleep (in general) NA  Pain is worse with: walking, bending, sitting, standing and some activites Pain improves with: rest, therapy/exercise and pacing activities Relief from Meds: 2  Mobility walk with assistance use a walker ability to climb steps?  no do you drive?  no use a wheelchair needs help with transfers  Function disabled: date disabled . I need assistance with the following:  feeding, dressing, bathing, toileting, meal prep, household duties and shopping  Neuro/Psych numbness tingling trouble walking spasms  Prior Studies Any changes since last visit?  no  Physicians involved in your care Any changes since last visit?  no   Family History  Problem Relation Age of Onset  . Hypertension Mother   . Stroke Father   . Hypertension Father   . Diabetes Father    Social History   Social History  . Marital Status: Single    Spouse Name: N/A  . Number of Children: 3  . Years of Education: 12   Occupational History  . disabled    Social History Main Topics  . Smoking status: Never Smoker   . Smokeless tobacco: Never Used  . Alcohol Use: No  .  Drug Use: No  . Sexual Activity: Not Asked   Other Topics Concern  . None   Social History Narrative   Patient is single with 3 children.   Patient is right handed.   Patient has hs education.   Patient does not drink caffeine.   Past Surgical History  Procedure Laterality Date  . Cesarean section  ~ 1987  . Tubal ligation    . Tee without cardioversion N/A 12/08/2013    Procedure: TRANSESOPHAGEAL ECHOCARDIOGRAM (TEE);  Surgeon: Candee Furbish, MD;  Location: [facility] ENDOSCOPY;  Service: Cardiovascular;  Laterality: N/A;   Past Medical History  Diagnosis Date  . Hypertension   . Depression   . Diabetes mellitus ([place])     ?type (05/29/2013)  . Bipolar affective disorder ([place])   . Schizophrenia ([place])     [name] 11/28/2007 (05/29/2013)  . Stroke ([place]) 05/27/2013  . 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 Aphasia due to stroke   . Episodic confusion    BP 116/77 mmHg  Pulse 75  Resp 16  Opioid Risk Score:   Fall Risk Score:  `1  Depression screen PHQ 2/9  Depression screen [facility] 2/9 06/18/2015 04/07/2015 03/04/2015 12/11/2014 12/11/2014 10/20/2014 06/04/2014  Decreased Interest 0 2 0 1 3 (No Data) 1  Down, Depressed, Hopeless 0 2 0 2 3 (No Data) 1  PHQ - 2 Score 0 4 0 3  6 - 2  Altered sleeping - 2 - 2 - - 1  Tired, decreased energy - 2 - 1 - - 2  Change in appetite - 2 - 1 - - 2  Feeling bad or failure about yourself  - 1 - 0 - - 0  Trouble concentrating - 1 - 1 - - 2  Moving slowly or fidgety/restless - 0 - 1 - - 0  Suicidal thoughts - 0 - 0 - - 0  PHQ-9 Score - 12 - 9 - - 9  Difficult doing work/chores - - - Somewhat difficult - - -     Review of Systems  All other systems reviewed and are negative.      Objective:   Physical Exam  Constitutional: She is oriented to person, place, and time. She appears well-developed and well-nourished.  HENT:  Head: Normocephalic and atraumatic.  Eyes: Conjunctivae and EOM are normal. Pupils are equal,  round, and reactive to light.  Neck: Normal range of motion.  Neurological: She is alert and oriented to person, place, and time.  Motor strength is 0/5 in the right upper extremity which is baseline Tone Ashworth grade 2 at the right index PIP and DIP, Ashworth 1 at the remaining digit PIP and DIP 1-2 at the right thumb IP joint Ashworth 0-1 at the wrist flexors and elbow flexors.  Psychiatric: She has a normal mood and affect.  Nursing note and vitals reviewed.   A phasic female in no acute distress Mood and affect without evidence of lability or agitation. Her right hand is without swelling or erythema. Nailbeds look normal. She does have some dead skin on the palmar surface of the hand at the MCP crease. She has mild pain with extension of the fingers as well as thumb. No joint swelling. No hyperesthesias. Her right wrist is without pain during range of motion no pain with supination or pronation no pain with elbow range of motion. There is no swelling at the wrist or at the forearm or arm area no formation around injection sites. She has pain with right shoulder range of motion which is chronic.      Assessment & Plan:  1. 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 She only has pain during range of motion of the finger flexors of the right hand. This is most likely due to relaxation of muscle and stretching of underlying soft tissues and joints which have been mildly contracted for several months.  Discussed with patient and her daughter that I would expect the Botox to wear off in 6 weeks. We discussed rescheduling at this time for another injection however the patient would like to wait to see how long it lasts. She will call back on a when necessary basis to reschedule Botox injection.

## 2015-07-19 ENCOUNTER — Encounter: Payer: Self-pay | Admitting: Sports Medicine

## 2015-07-19 ENCOUNTER — Ambulatory Visit (INDEPENDENT_AMBULATORY_CARE_PROVIDER_SITE_OTHER): Payer: Medicaid Other | Admitting: Sports Medicine

## 2015-07-19 DIAGNOSIS — M79674 Pain in right toe(s): Secondary | ICD-10-CM

## 2015-07-19 DIAGNOSIS — M79675 Pain in left toe(s): Secondary | ICD-10-CM

## 2015-07-19 DIAGNOSIS — M79676 Pain in unspecified toe(s): Secondary | ICD-10-CM

## 2015-07-19 DIAGNOSIS — B353 Tinea pedis: Secondary | ICD-10-CM

## 2015-07-19 DIAGNOSIS — B351 Tinea unguium: Secondary | ICD-10-CM | POA: Diagnosis not present

## 2015-07-19 DIAGNOSIS — E1142 Type 2 diabetes mellitus with diabetic polyneuropathy: Secondary | ICD-10-CM

## 2015-07-19 NOTE — Progress Notes (Signed)
Patient ID: [name], female   DOB: May 07, 1960, 55 y.o.   MRN: [id number]   Subjective: [name] is a 55 y.o. female patient with history of type 2 diabetes who presents to office today complaining of long, painful nails; unable to trim. Patient states that the glucose reading this morning was around 90mg /dl. Patient is assisted by daughter. Patient's daughter denies any new changes in medication or new problems. Patient denies any new cramping, numbness, burning or tingling in the legs.  Patient Active Problem List   Diagnosis Date Noted  . Urine leukocytes 01/08/2015  . Expressive aphasia 01/08/2015  . Malnutrition of moderate degree ([place]) 11/11/2014  . Syncope 11/08/2014  . Dehydration 11/08/2014  . Staring spell 07/09/2014  . Adhesive capsulitis of right shoulder 06/04/2014  . Palpitations 04/02/2014  . Essential hypertension 01/05/2014  . Type 2 diabetes mellitus with other diabetic neurological complication ([place]) [id number]  . Paranoid schizophrenia ([place]) 12/28/2013  . Cerebral embolism with cerebral infarction ([place]) 12/05/2013  . Sinus tachycardia ([place]) 12/05/2013  . 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 Spastic hemiplegia affecting dominant side ([place]) 09/12/2013  . CVA (cerebral infarction) 06/04/2013  . Dysphagia 06/02/2013  . Benign polycythemia 06/02/2013  . Malignant hypertension 05/28/2013  . DM (diabetes mellitus) type II controlled, neurological manifestation ([place]) 05/27/2013  . Acute embolic stroke ([place]) [id number]  . 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 Hypertensive emergency 05/27/2013  . Stroke ([place]) 05/27/2013  . Aphasia complicating stroke [id number]   Current Outpatient Prescriptions on File Prior to Visit  Medication Sig Dispense Refill  . acetaminophen (TYLENOL) 325 MG tablet Take 1-2 tablets (325-650 mg total) by mouth every 4 (four) hours as  needed for mild pain (available over the counter). (Patient not taking: Reported on 06/15/2015)    . albuterol (PROVENTIL HFA;VENTOLIN HFA) 108 (90 Base) MCG/ACT inhaler Inhale 2 puffs into the lungs every 4 (four) hours as needed for wheezing or shortness of breath. 1 Inhaler 0  . amLODipine (NORVASC) 10 MG tablet TAKE 1 TABLET BY MOUTH EVERY DAY FOR BLOOD PRESSURE 90 tablet 0  . amoxicillin-clavulanate (AUGMENTIN) 875-125 MG tablet Take 1 tablet by mouth 2 (two) times daily. 14 tablet 0  . aspirin 325 MG tablet Take 325 mg by mouth daily.    . baclofen (LIORESAL) 10 MG tablet TAKE 1 TABLET BY MOUTH TWO TIMES DAILY TO HELP WITH TONE/SPASMS 60 tablet 2  . feeding supplement, ENSURE ENLIVE, (ENSURE ENLIVE) LIQD Take 237 mLs by mouth 2 (two) times daily between meals. (Patient not taking: Reported on 06/15/2015)    . fluticasone (FLONASE) 50 MCG/ACT nasal spray Place 2 sprays into both nostrils daily. (Patient not taking: Reported on 06/18/2015) 16 g 6  . furosemide (LASIX) 20 MG tablet Take 1 tablet (20 mg total) by mouth daily. (Patient not taking: Reported on 06/15/2015) 3 tablet 0  . gabapentin (NEURONTIN) 100 MG capsule TAKE 1 CAPSULE BY MOUTH THREE TIMES DAILY 90 capsule 2  . hydrochlorothiazide (HYDRODIURIL) 25 MG tablet TAKE 1 TABLET BY MOUTH EVERY DAY 30 tablet 2  . Insulin Glargine (LANTUS SOLOSTAR) 100 UNIT/ML Solostar Pen Inject 10 Units into the skin daily at 10 pm. Once DAILY (Patient taking differently: Inject 10 Units into the skin daily as needed (for blood sugar). ) 5 pen 11  . metoprolol (LOPRESSOR) 100 MG tablet TAKE 1 TABLET BY MOUTH THREE TIMES DAILY 90 tablet 2  .  polyethylene glycol powder (GLYCOLAX/MIRALAX) powder Take 17 g by mouth daily as needed (constipation). 527 g 3  . protein supplement shake (PREMIER PROTEIN) LIQD Take 2 oz by mouth daily.    . Senna (CORRECTOL HERBAL TEA PO) Take 1 packet by mouth daily as needed (for constipation).    . SENNA LAX 8.6 MG tablet TAKE 1  TABLET BY MOUTH EVERY DAY AS NEEDED FOR CONSTIPATION 30 tablet 2  . simvastatin (ZOCOR) 20 MG tablet TAKE 1 TABLET BY MOUTH EVERY DAY AT 6PM 30 tablet 2  . sorbitol 70 % solution Take 15 mLs by mouth as needed. For constipation 240 mL 0  . Spacer/Aero-Holding Chambers (E-Z SPACER) inhaler Use as instructed 1 each 2  . UNIFINE PENTIPS 31G X 6 MM MISC USE AS PRESCRIBED BY PHYSICIAN 100 each 11   No current facility-administered medications on file prior to visit.   No Known Allergies   Objective: General: Patient is awake, alert, and oriented x 3 and in no acute distress.  Integument: Skin is warm, dry and supple bilateral. Nails are tender, long, thickened and  dystrophic with subungual debris, consistent with onychomycosis, 1-5 bilateral. No interdigital macerations 1-4 bilateral, No signs of infection. No open lesions or preulcerative lesions present bilateral. Remaining integument unremarkable.  Vasculature:  Dorsalis Pedis pulse 0/4 bilateral. Posterior Tibial pulse  1/4 bilateral.  Capillary fill time <3 sec 1-5 bilateral. No hair growth to the level of the digits. Temperature gradient within normal limits. No varicosities present bilateral. +1 pitting edema present bilateral.   Neurology: The patient has diminished sensation measured with a 5.07/10g Semmes Weinstein Monofilament at toes bilateral . Vibratory sensation diminished bilateral with tuning fork. No Babinski sign present bilateral.   Musculoskeletal: No gross pedal deformities noted bilateral. Muscular strength 4/5 in all lower extremity muscular groups on left, 3/5 on right without pain on range of motion; Stroke with right sided weakness. No tenderness with calf compression bilateral.  Assessment and Plan: Problem List Items Addressed This Visit    None    Visit Diagnoses    Dermatophytosis of nail    -  Primary    Tinea pedis of both feet        improved    Pain in toes of both feet        Diabetic polyneuropathy  associated with type 2 diabetes mellitus ([place])          -Examined patient. -Discussed and educated patient on diabetic foot care, especially with  regards to the vascular, neurological and musculoskeletal systems.  -Stressed the importance of good glycemic control and the detriment of not  controlling glucose levels in relation to the foot. -Mechanically debrided all nails 1-5 bilateral using sterile nail nipper and filed with dremel without incident  -Cont with good hygiene and Gold bond foot powder as needed to prevent tinea -Answered all patient questions -Patient to return in 3 months for at risk foot care -Patient advised to call the office if any problems or questions arise in the meantime.  Landis Martins, DPM

## 2015-07-26 ENCOUNTER — Other Ambulatory Visit: Payer: Self-pay | Admitting: Internal Medicine

## 2015-08-21 ENCOUNTER — Emergency Department (HOSPITAL_COMMUNITY)
Admission: EM | Admit: 2015-08-21 | Discharge: 2015-08-21 | Disposition: A | Discharge status: Home / Self Care | Payer: Medicaid Other | Attending: Emergency Medicine | Admitting: Emergency Medicine

## 2015-08-21 ENCOUNTER — Emergency Department (HOSPITAL_COMMUNITY): Payer: Medicaid Other

## 2015-08-21 ENCOUNTER — Encounter (HOSPITAL_COMMUNITY): Payer: Self-pay

## 2015-08-21 DIAGNOSIS — I69320 Aphasia following cerebral infarction: Secondary | ICD-10-CM | POA: Diagnosis not present

## 2015-08-21 DIAGNOSIS — I69351 Hemiplegia and hemiparesis following cerebral infarction affecting right dominant side: Secondary | ICD-10-CM | POA: Diagnosis not present

## 2015-08-21 DIAGNOSIS — Z7982 Long term (current) use of aspirin: Secondary | ICD-10-CM | POA: Diagnosis not present

## 2015-08-21 DIAGNOSIS — Z794 Long term (current) use of insulin: Secondary | ICD-10-CM | POA: Diagnosis not present

## 2015-08-21 DIAGNOSIS — I10 Essential (primary) hypertension: Secondary | ICD-10-CM | POA: Diagnosis not present

## 2015-08-21 DIAGNOSIS — Z792 Long term (current) use of antibiotics: Secondary | ICD-10-CM | POA: Insufficient documentation

## 2015-08-21 DIAGNOSIS — R110 Nausea: Secondary | ICD-10-CM | POA: Insufficient documentation

## 2015-08-21 DIAGNOSIS — Z9889 Other specified postprocedural states: Secondary | ICD-10-CM | POA: Diagnosis not present

## 2015-08-21 DIAGNOSIS — Z9851 Tubal ligation status: Secondary | ICD-10-CM | POA: Insufficient documentation

## 2015-08-21 DIAGNOSIS — R1031 Right lower quadrant pain: Secondary | ICD-10-CM | POA: Diagnosis present

## 2015-08-21 DIAGNOSIS — E119 Type 2 diabetes mellitus without complications: Secondary | ICD-10-CM | POA: Diagnosis not present

## 2015-08-21 DIAGNOSIS — Z79899 Other long term (current) drug therapy: Secondary | ICD-10-CM | POA: Diagnosis not present

## 2015-08-21 DIAGNOSIS — F319 Bipolar disorder, unspecified: Secondary | ICD-10-CM | POA: Diagnosis not present

## 2015-08-21 DIAGNOSIS — R29810 Facial weakness: Secondary | ICD-10-CM | POA: Diagnosis not present

## 2015-08-21 LAB — URINALYSIS, ROUTINE W REFLEX MICROSCOPIC
BILIRUBIN URINE: NEGATIVE
GLUCOSE, UA: NEGATIVE mg/dL
HGB URINE DIPSTICK: NEGATIVE
KETONES UR: NEGATIVE mg/dL
Leukocytes, UA: NEGATIVE
Nitrite: NEGATIVE
PROTEIN: NEGATIVE mg/dL
Specific Gravity, Urine: 1.021 (ref 1.005–1.030)
pH: 6 (ref 5.0–8.0)

## 2015-08-21 LAB — COMPREHENSIVE METABOLIC PANEL
ALK PHOS: 56 U/L (ref 38–126)
ALT: 19 U/L (ref 14–54)
AST: 23 U/L (ref 15–41)
Albumin: 3.8 g/dL (ref 3.5–5.0)
Anion gap: 4 — ABNORMAL LOW (ref 5–15)
BUN: 16 mg/dL (ref 6–20)
CALCIUM: 9.7 mg/dL (ref 8.9–10.3)
CHLORIDE: 106 mmol/L (ref 101–111)
CO2: 32 mmol/L (ref 22–32)
CREATININE: 0.87 mg/dL (ref 0.44–1.00)
Glucose, Bld: 95 mg/dL (ref 65–99)
Potassium: 3.5 mmol/L (ref 3.5–5.1)
SODIUM: 142 mmol/L (ref 135–145)
Total Bilirubin: 0.4 mg/dL (ref 0.3–1.2)
Total Protein: 7.4 g/dL (ref 6.5–8.1)

## 2015-08-21 LAB — LIPASE, BLOOD: Lipase: 31 U/L (ref 11–51)

## 2015-08-21 LAB — CBC
HCT: 40 % (ref 36.0–46.0)
Hemoglobin: 12.8 g/dL (ref 12.0–15.0)
MCH: 29.8 pg (ref 26.0–34.0)
MCHC: 32 g/dL (ref 30.0–36.0)
MCV: 93 fL (ref 78.0–100.0)
PLATELETS: 218 10*3/uL (ref 150–400)
RBC: 4.3 MIL/uL (ref 3.87–5.11)
RDW: 12.4 % (ref 11.5–15.5)
WBC: 6.2 10*3/uL (ref 4.0–10.5)

## 2015-08-21 MED ORDER — ONDANSETRON HCL 4 MG/2ML IJ SOLN
4.0000 mg | Freq: Once | INTRAMUSCULAR | Status: DC
  Filled 2015-08-21: qty 2

## 2015-08-21 MED ORDER — IOPAMIDOL (ISOVUE-300) INJECTION 61%
INTRAVENOUS | Status: AC
  Administered 2015-08-21: 75 mL via INTRAVENOUS
  Filled 2015-08-21: qty 75

## 2015-08-21 MED ORDER — IOPAMIDOL (ISOVUE-300) INJECTION 61%
INTRAVENOUS | Status: AC
  Filled 2015-08-21: qty 75

## 2015-08-21 MED ORDER — SODIUM CHLORIDE 0.9 % IV BOLUS (SEPSIS)
1000.0000 mL | Freq: Once | INTRAVENOUS | Status: AC
  Administered 2015-08-21: 1000 mL via INTRAVENOUS

## 2015-08-21 MED ORDER — MORPHINE SULFATE (PF) 4 MG/ML IV SOLN
4.0000 mg | Freq: Once | INTRAVENOUS | Status: DC
  Filled 2015-08-21: qty 1

## 2015-08-21 MED ORDER — IOPAMIDOL (ISOVUE-300) INJECTION 61%
INTRAVENOUS | Status: AC
  Filled 2015-08-21: qty 100

## 2015-08-21 NOTE — Discharge Instructions (Signed)
As discussed, today's evaluation is largely reassuring, though there is some evidence for gallstones. This requires additional evaluation with both your primary care physician and our surgical colleagues.  Return here for concerning changes in your condition.

## 2015-08-21 NOTE — ED Notes (Signed)
PT refuses Pain meds  And nausea meds.

## 2015-08-21 NOTE — ED Provider Notes (Signed)
6:06 PM Patient and daughter aware of all findings, including gallstones. Patient is smiling. We discussed return precautions, follow-up instructions. Vital signs remained unremarkable, patient will follow-up with general surgery.  Carmin Muskrat, MD 08/21/15 1806

## 2015-08-21 NOTE — ED Provider Notes (Signed)
CSN: [id number]     Arrival date & time 08/21/15  1134 History   First MD Initiated Contact with Patient 08/21/15 1147     Chief Complaint  Patient presents with  . Abdominal Pain     (Consider location/radiation/quality/duration/timing/severity/associated sxs/prior Treatment) Patient is a 55 y.o. female presenting with abdominal pain. The history is provided by the patient.  Abdominal Pain Pain location:  RLQ Pain quality: sharp and shooting   Pain radiates to:  Does not radiate Pain severity:  Moderate Onset quality:  Gradual Duration:  2 days Timing:  Constant Progression:  Worsening Chronicity:  New Relieved by:  Nothing Worsened by:  Nothing tried Ineffective treatments:  None tried Associated symptoms: nausea   Associated symptoms: no chest pain, no chills, no dysuria, no fever, no shortness of breath and no vomiting    55 yo F With a chief complaint of abdominal pain. Worse in the right lower quadrant. Started yesterday and was periumbilical. Had some nausea denies vomiting. Has been able to eat and drink a little bit. Denies fevers. Patient is in a nursing home due to a severe stroke in the past. Family was notified of this abdominal pain today.  Past Medical History  Diagnosis Date  . Hypertension   . Depression   . Diabetes mellitus ([place])     ?type (05/29/2013)  . Bipolar affective disorder ([place])   . Schizophrenia ([place])     [name] 11/28/2007 (05/29/2013)  . Stroke ([place]) 05/27/2013  . 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 Aphasia due to stroke   . Episodic confusion    Past Surgical History  Procedure Laterality Date  . Cesarean section  ~ 1987  . Tubal ligation    . Tee without cardioversion N/A 12/08/2013    Procedure: TRANSESOPHAGEAL ECHOCARDIOGRAM (TEE);  Surgeon: Candee Furbish, MD;  Location: [facility] ENDOSCOPY;  Service: Cardiovascular;  Laterality: N/A;   Family History  Problem Relation Age of Onset  . Hypertension Mother   .  Stroke Father   . Hypertension Father   . Diabetes Father    Social History  Substance Use Topics  . Smoking status: Never Smoker   . Smokeless tobacco: Never Used  . Alcohol Use: No   OB History    No data available     Review of Systems  Constitutional: Negative for fever and chills.  HENT: Negative for congestion and rhinorrhea.   Eyes: Negative for redness and visual disturbance.  Respiratory: Negative for shortness of breath and wheezing.   Cardiovascular: Negative for chest pain and palpitations.  Gastrointestinal: Positive for nausea and abdominal pain. Negative for vomiting.  Genitourinary: Negative for dysuria and urgency.  Musculoskeletal: Negative for myalgias and arthralgias.  Skin: Negative for pallor and wound.  Neurological: Negative for dizziness and headaches.      Allergies  Review of patient's allergies indicates no known allergies.  Home Medications   Prior to Admission medications   Medication Sig Start Date End Date Taking? Authorizing Provider  ACCU-CHEK AVIVA PLUS test strip USE 1 STRIP TO TEST BEFORE MEALS AND AT BEDTIME 07/26/15   Tresa Garter, MD  ACCU-CHEK SOFTCLIX LANCETS lancets TEST BEFORE MEALS AND AT BEDTIME 07/26/15   Tresa Garter, MD  acetaminophen (TYLENOL) 325 MG tablet Take 1-2 tablets (325-650 mg total) by mouth every 4 (four) hours as needed for mild pain (available over the counter). Patient not taking: Reported on 06/15/2015 07/08/13   Bary Leriche,  PA-C  albuterol (PROVENTIL HFA;VENTOLIN HFA) 108 (90 Base) MCG/ACT inhaler Inhale 2 puffs into the lungs every 4 (four) hours as needed for wheezing or shortness of breath. 06/15/15   Francine Graven, DO  amLODipine (NORVASC) 10 MG tablet TAKE 1 TABLET BY MOUTH EVERY DAY FOR BLOOD PRESSURE 03/16/15   Lance Bosch, NP  amoxicillin-clavulanate (AUGMENTIN) 875-125 MG tablet Take 1 tablet by mouth 2 (two) times daily. 06/18/15   Lance Bosch, NP  aspirin 325 MG tablet Take 325 mg  by mouth daily.    Historical Provider, MD  baclofen (LIORESAL) 10 MG tablet TAKE 1 TABLET BY MOUTH TWO TIMES DAILY TO HELP WITH TONE/SPASMS 05/24/15   Lance Bosch, NP  feeding supplement, ENSURE ENLIVE, (ENSURE ENLIVE) LIQD Take 237 mLs by mouth 2 (two) times daily between meals. Patient not taking: Reported on 06/15/2015 11/11/14   Jonetta Osgood, MD  fluticasone [facility]) 50 MCG/ACT nasal spray Place 2 sprays into both nostrils daily. Patient not taking: Reported on 06/18/2015 03/04/15   Lance Bosch, NP  furosemide (LASIX) 20 MG tablet Take 1 tablet (20 mg total) by mouth daily. Patient not taking: Reported on 06/15/2015 04/07/15   Lance Bosch, NP  gabapentin (NEURONTIN) 100 MG capsule TAKE 1 CAPSULE BY MOUTH THREE TIMES DAILY 06/18/15   Lance Bosch, NP  hydrochlorothiazide (HYDRODIURIL) 25 MG tablet TAKE 1 TABLET BY MOUTH EVERY DAY 05/24/15   Lance Bosch, NP  Insulin Glargine (LANTUS SOLOSTAR) 100 UNIT/ML Solostar Pen Inject 10 Units into the skin daily at 10 pm. Once DAILY Patient taking differently: Inject 10 Units into the skin daily as needed (for blood sugar).  12/17/14   Lance Bosch, NP  metoprolol (LOPRESSOR) 100 MG tablet TAKE 1 TABLET BY MOUTH THREE TIMES DAILY 05/24/15   Lance Bosch, NP  polyethylene glycol powder (GLYCOLAX/MIRALAX) powder Take 17 g by mouth daily as needed (constipation). 02/22/15   Lance Bosch, NP  protein supplement shake (PREMIER PROTEIN) LIQD Take 2 oz by mouth daily.    Historical Provider, MD  Senna (CORRECTOL HERBAL TEA PO) Take 1 packet by mouth daily as needed (for constipation).    Historical Provider, MD  SENNA LAX 8.6 MG tablet TAKE 1 TABLET BY MOUTH EVERY DAY AS NEEDED FOR CONSTIPATION 06/18/15   Lance Bosch, NP  simvastatin (ZOCOR) 20 MG tablet TAKE 1 TABLET BY MOUTH EVERY DAY AT [facility] 07/26/15   Olugbemiga E Doreene Burke, MD  sorbitol 70 % solution Take 15 mLs by mouth as needed. For constipation 06/18/15   Lance Bosch, NP  Spacer/Aero-Holding  Chambers (E-Z SPACER) inhaler Use as instructed 06/18/15   Lance Bosch, NP  UNIFINE PENTIPS 31G X 6 MM MISC USE AS PRESCRIBED BY PHYSICIAN 03/16/15   Lance Bosch, NP   BP 142/85 mmHg  Pulse 68  Temp(Src) 97.9 F (36.6 C) (Oral)  Resp 16  Wt 115 lb (52.164 kg)  SpO2 100% Physical Exam  Constitutional: She is oriented to person, place, and time. She appears well-developed and well-nourished. No distress.  HENT:  Head: Normocephalic and atraumatic.  Eyes: EOM are normal. Pupils are equal, round, and reactive to light.  Neck: Normal range of motion. Neck supple.  Cardiovascular: Normal rate and regular rhythm.  Exam reveals no gallop and no friction rub.   No murmur heard. Pulmonary/Chest: Effort normal. She has no wheezes. She has no rales.  Abdominal: Soft. She exhibits no distension. There is tenderness (RLQ).  There is guarding. There is no rebound.  Musculoskeletal: She exhibits no edema or tenderness.  Neurological: She is alert and oriented to person, place, and time.  Right-sided facial droop. Slurred speech.  Skin: Skin is warm and dry. She is not diaphoretic.  Psychiatric: She has a normal mood and affect. Her behavior is normal.  Nursing note and vitals reviewed.   ED Course  Procedures (including critical care time) Labs Review Labs Reviewed  COMPREHENSIVE METABOLIC PANEL - Abnormal; Notable for the following:    Anion gap 4 (*)    All other components within normal limits  LIPASE, BLOOD  CBC  URINALYSIS, ROUTINE W REFLEX MICROSCOPIC (NOT AT [facility])    Imaging Review Ct Abdomen Pelvis W Contrast  08/21/2015  CLINICAL DATA:  Right lower quadrant pain. EXAM: CT ABDOMEN AND PELVIS WITH CONTRAST TECHNIQUE: Multidetector CT imaging of the abdomen and pelvis was performed using the standard protocol following bolus administration of intravenous contrast. CONTRAST:  75 mL ISOVUE-300 IOPAMIDOL (ISOVUE-300) INJECTION 61% COMPARISON:  None. FINDINGS: Lower chest: No acute  findings. No abnormal fluid collections are identified. Moderate to large amount of colonic stool noted. Hepatobiliary: No liver masses are identified. Multiple small calcified gallstones are seen however gallbladder is nondistended and there is no evidence of gallbladder wall thickening or biliary ductal dilatation. Pancreas: No mass, inflammatory changes, or other significant abnormality. Spleen: Within normal limits in size and appearance. Adrenals/Urinary Tract: No masses identified. No evidence of hydronephrosis. A tiny 1-2 mm punctate calculus is seen in the lower pole the right kidney. Small benign-appearing right renal cyst also noted. Unopacified urinary bladder is unremarkable in appearance. Stomach/Bowel: No evidence of bowel obstruction. Appendix is not well visualized due to lack of oral contrast and intra-abdominal fat, but no focal inflammatory process seen in the area the cecum or elsewhere within the abdomen or pelvis. Vascular/Lymphatic: No pathologically enlarged lymph nodes. No evidence of abdominal aortic aneurysm. Reproductive: No mass or other significant abnormality. Other: Small midline periumbilical abdominal wall hernia is seen containing nondilated small bowel loops. Musculoskeletal:  No suspicious bone lesions identified. IMPRESSION: Cholelithiasis. No radiographic evidence of cholecystitis or biliary dilatation. Appendix not well visualized due to lack of intraabdominal fat and oral contrast, however no secondary signs of appendicitis identified. Large stool burden noted; suggest clinical correlation for possible constipation. Tiny nonobstructive right renal calculus. Small paraumbilical ventral hernia containing small bowel. No evidence of small bowel obstruction or ischemia. Electronically Signed   By: Earle Gell M.D.   On: 08/21/2015 17:31   I have personally reviewed and evaluated these images and lab results as part of my medical decision-making.   EKG Interpretation None       MDM   Final diagnoses:  RLQ abdominal pain    55 yo F with a chief complaint of right lower quadrant abdominal pain. Significant tenderness at mcburneys point, will ct.   Turned over to Dr. Vanita Panda, if CT negative, likely d/c home.   The patients results and plan were reviewed and discussed.   Any x-rays performed were independently reviewed by myself.   Differential diagnosis were considered with the presenting HPI.  Medications  sodium chloride 0.9 % bolus 1,000 mL (0 mLs Intravenous Stopped 08/21/15 1842)  iopamidol (ISOVUE-300) 61 % injection (75 mLs Intravenous Contrast Given 08/21/15 1700)    Filed Vitals:   08/21/15 1745 08/21/15 1800 08/21/15 1815 08/21/15 1830  BP: 139/78 131/84 142/96 142/85  Pulse: 67 80 69 68  Temp:  TempSrc:      Resp:      Weight:      SpO2: 99% 99% 100% 100%    Final diagnoses:  RLQ abdominal pain       Deno Etienne, DO 08/22/15 [id number]

## 2015-08-21 NOTE — ED Notes (Signed)
Patient arrived by [facility] from home with complaint of abdominal discomfort around umbilicus. No nausea, no vomiting, no diarrhea. Patient has had previous stroke with deficits of slurred speech and right sided weakness

## 2015-08-31 ENCOUNTER — Other Ambulatory Visit: Payer: Self-pay | Admitting: Internal Medicine

## 2015-09-09 ENCOUNTER — Ambulatory Visit: Payer: Medicaid Other | Admitting: Family Medicine

## 2015-09-22 ENCOUNTER — Other Ambulatory Visit: Payer: Self-pay | Admitting: Internal Medicine

## 2015-09-25 ENCOUNTER — Emergency Department (HOSPITAL_COMMUNITY): Payer: Medicaid Other

## 2015-09-25 ENCOUNTER — Emergency Department (HOSPITAL_COMMUNITY)
Admission: EM | Admit: 2015-09-25 | Discharge: 2015-09-25 | Disposition: A | Discharge status: Home / Self Care | Payer: Medicaid Other | Attending: Emergency Medicine | Admitting: Emergency Medicine

## 2015-09-25 ENCOUNTER — Encounter (HOSPITAL_COMMUNITY): Payer: Self-pay | Admitting: Emergency Medicine

## 2015-09-25 DIAGNOSIS — Z794 Long term (current) use of insulin: Secondary | ICD-10-CM | POA: Insufficient documentation

## 2015-09-25 DIAGNOSIS — Z8673 Personal history of transient ischemic attack (TIA), and cerebral infarction without residual deficits: Secondary | ICD-10-CM | POA: Insufficient documentation

## 2015-09-25 DIAGNOSIS — Z7982 Long term (current) use of aspirin: Secondary | ICD-10-CM | POA: Insufficient documentation

## 2015-09-25 DIAGNOSIS — R410 Disorientation, unspecified: Secondary | ICD-10-CM | POA: Insufficient documentation

## 2015-09-25 DIAGNOSIS — R569 Unspecified convulsions: Secondary | ICD-10-CM | POA: Diagnosis present

## 2015-09-25 DIAGNOSIS — I10 Essential (primary) hypertension: Secondary | ICD-10-CM | POA: Diagnosis not present

## 2015-09-25 DIAGNOSIS — E119 Type 2 diabetes mellitus without complications: Secondary | ICD-10-CM | POA: Insufficient documentation

## 2015-09-25 LAB — COMPREHENSIVE METABOLIC PANEL
ALK PHOS: 52 U/L (ref 38–126)
ALT: 20 U/L (ref 14–54)
AST: 23 U/L (ref 15–41)
Albumin: 3.6 g/dL (ref 3.5–5.0)
Anion gap: 10 (ref 5–15)
BUN: 14 mg/dL (ref 6–20)
CALCIUM: 9.4 mg/dL (ref 8.9–10.3)
CHLORIDE: 105 mmol/L (ref 101–111)
CO2: 26 mmol/L (ref 22–32)
CREATININE: 0.95 mg/dL (ref 0.44–1.00)
GFR calc non Af Amer: >60 mL/min (ref >60)
Glucose, Bld: 128 mg/dL — ABNORMAL HIGH (ref 65–99)
Potassium: 3.5 mmol/L (ref 3.5–5.1)
SODIUM: 141 mmol/L (ref 135–145)
Total Bilirubin: 0.5 mg/dL (ref 0.3–1.2)
Total Protein: 6.5 g/dL (ref 6.5–8.1)

## 2015-09-25 LAB — CBC
HCT: 40.9 % (ref 36.0–46.0)
HEMOGLOBIN: 13.4 g/dL (ref 12.0–15.0)
MCH: 30.5 pg (ref 26.0–34.0)
MCHC: 32.8 g/dL (ref 30.0–36.0)
MCV: 93 fL (ref 78.0–100.0)
PLATELETS: 192 10*3/uL (ref 150–400)
RBC: 4.4 MIL/uL (ref 3.87–5.11)
RDW: 12.2 % (ref 11.5–15.5)
WBC: 6.1 10*3/uL (ref 4.0–10.5)

## 2015-09-25 MED ORDER — LEVETIRACETAM 500 MG PO TABS: 500.0000 mg | ORAL_TABLET | Freq: Two times a day (BID) | ORAL | Status: DC

## 2015-09-25 MED ORDER — AMLODIPINE BESYLATE 5 MG PO TABS
10.0000 mg | ORAL_TABLET | Freq: Once | ORAL | Status: AC
  Administered 2015-09-25: 10 mg via ORAL
  Filled 2015-09-25: qty 2

## 2015-09-25 MED ORDER — SODIUM CHLORIDE 0.9 % IV SOLN
500.0000 mg | Freq: Once | INTRAVENOUS | Status: AC
  Administered 2015-09-25: 500 mg via INTRAVENOUS
  Filled 2015-09-25: qty 5

## 2015-09-25 MED ORDER — METOPROLOL TARTRATE 25 MG PO TABS
100.0000 mg | ORAL_TABLET | Freq: Once | ORAL | Status: AC
  Administered 2015-09-25: 100 mg via ORAL
  Filled 2015-09-25: qty 4

## 2015-09-25 NOTE — ED Notes (Signed)
Urine sample obtained by bedpan was contaminated by feces.

## 2015-09-25 NOTE — Discharge Instructions (Signed)
It was our pleasure to provide your ER care today - we hope that you feel better.  Take keppra (seizure medication) as prescribed.  Follow up with neurologist in the next 1-2 weeks - see referral - call office Monday AM to arrange appointment.  Your blood pressure is high today - continue your blood pressure medication, and follow up with your doctor in the coming week.   Return to ER right away if worse, new symptoms, recurrent seizures, fevers, vomiting, change in mental status, other concern.      Seizure, Adult A seizure is abnormal electrical activity in the brain. Seizures usually last from 30 seconds to 2 minutes. There are various types of seizures. Before a seizure, you may have a warning sensation (aura) that a seizure is about to occur. An aura may include the following symptoms:   Fear or anxiety.  Nausea.  Feeling like the room is spinning (vertigo).  Vision changes, such as seeing flashing lights or spots. Common symptoms during a seizure include:  A change in attention or behavior (altered mental status).  Convulsions with rhythmic jerking movements.  Drooling.  Rapid eye movements.  Grunting.  Loss of bladder and bowel control.  Bitter taste in the mouth.  Tongue biting. After a seizure, you may feel confused and sleepy. You may also have an injury resulting from convulsions during the seizure. HOME CARE INSTRUCTIONS   If you are given medicines, take them exactly as prescribed by your health care provider.  Keep all follow-up appointments as directed by your health care provider.  Do not swim or drive or engage in risky activity during which a seizure could cause further injury to you or others until your health care provider says it is OK.  Get adequate rest.  Teach friends and family what to do if you have a seizure. They should:  Lay you on the ground to prevent a fall.  Put a cushion under your head.  Loosen any tight clothing around your  neck.  Turn you on your side. If vomiting occurs, this helps keep your airway clear.  Stay with you until you recover.  Know whether or not you need emergency care. SEEK IMMEDIATE MEDICAL CARE IF:  The seizure lasts longer than 5 minutes.  The seizure is severe or you do not wake up immediately after the seizure.  You have an altered mental status after the seizure.  You are having more frequent or worsening seizures. Someone should drive you to the emergency department or call local emergency services (911 in [place] MAKE SURE YOU:  Understand these instructions.  Will watch your condition.  Will get help right away if you are not doing well or get worse.   This information is not intended to replace advice given to you by your health care provider. Make sure you discuss any questions you have with your health care provider.   Document Released: 03/03/2000 Document Revised: 03/27/2014 Document Reviewed: 10/16/2012 Elsevier Interactive Patient Education 2016 [employer].   Hypertension Hypertension, commonly called high blood pressure, is when the force of blood pumping through your arteries is too strong. Your arteries are the blood vessels that carry blood from your heart throughout your body. A blood pressure reading consists of a higher number over a lower number, such as 110/72. The higher number (systolic) is the pressure inside your arteries when your heart pumps. The lower number (diastolic) is the pressure inside your arteries when your heart relaxes. Ideally you want your  blood pressure below 120/80. Hypertension forces your heart to work harder to pump blood. Your arteries may become narrow or stiff. Having untreated or uncontrolled hypertension can cause heart attack, stroke, kidney disease, and other problems. RISK FACTORS Some risk factors for high blood pressure are controllable. Others are not.  Risk factors you cannot control include:   Race. You may be at  higher risk if you are African American.  Age. Risk increases with age.  Gender. Men are at higher risk than women before age 71 years. After age 35, women are at higher risk than men. Risk factors you can control include:  Not getting enough exercise or physical activity.  Being overweight.  Getting too much fat, sugar, calories, or salt in your diet.  Drinking too much alcohol. SIGNS AND SYMPTOMS Hypertension does not usually cause signs or symptoms. Extremely high blood pressure (hypertensive crisis) may cause headache, anxiety, shortness of breath, and nosebleed. DIAGNOSIS To check if you have hypertension, your health care provider will measure your blood pressure while you are seated, with your arm held at the level of your heart. It should be measured at least twice using the same arm. Certain conditions can cause a difference in blood pressure between your right and left arms. A blood pressure reading that is higher than normal on one occasion does not mean that you need treatment. If it is not clear whether you have high blood pressure, you may be asked to return on a different day to have your blood pressure checked again. Or, you may be asked to monitor your blood pressure at home for 1 or more weeks. TREATMENT Treating high blood pressure includes making lifestyle changes and possibly taking medicine. Living a healthy lifestyle can help lower high blood pressure. You may need to change some of your habits. Lifestyle changes may include:  Following the DASH diet. This diet is high in fruits, vegetables, and whole grains. It is low in salt, red meat, and added sugars.  Keep your sodium intake below 2,300 mg per day.  Getting at least 30-45 minutes of aerobic exercise at least 4 times per week.  Losing weight if necessary.  Not smoking.  Limiting alcoholic beverages.  Learning ways to reduce stress. Your health care provider may prescribe medicine if lifestyle changes are  not enough to get your blood pressure under control, and if one of the following is true:  You are 84-66 years of age and your systolic blood pressure is above 140.  You are 31 years of age or older, and your systolic blood pressure is above 150.  Your diastolic blood pressure is above 90.  You have diabetes, and your systolic blood pressure is over [id number] or your diastolic blood pressure is over 90.  You have kidney disease and your blood pressure is above 140/90.  You have heart disease and your blood pressure is above 140/90. Your personal target blood pressure may vary depending on your medical conditions, your age, and other factors. HOME CARE INSTRUCTIONS  Have your blood pressure rechecked as directed by your health care provider.   Take medicines only as directed by your health care provider. Follow the directions carefully. Blood pressure medicines must be taken as prescribed. The medicine does not work as well when you skip doses. Skipping doses also puts you at risk for problems.  Do not smoke.   Monitor your blood pressure at home as directed by your health care provider. SEEK MEDICAL CARE IF:  You think you are having a reaction to medicines taken.  You have recurrent headaches or feel dizzy.  You have swelling in your ankles.  You have trouble with your vision. SEEK IMMEDIATE MEDICAL CARE IF:  You develop a severe headache or confusion.  You have unusual weakness, numbness, or feel faint.  You have severe chest or abdominal pain.  You vomit repeatedly.  You have trouble breathing. MAKE SURE YOU:   Understand these instructions.  Will watch your condition.  Will get help right away if you are not doing well or get worse.   This information is not intended to replace advice given to you by your health care provider. Make sure you discuss any questions you have with your health care provider.   Document Released: 03/06/2005 Document Revised: 07/21/2014  Document Reviewed: 12/27/2012 Elsevier Interactive Patient Education [employer].

## 2015-09-25 NOTE — ED Notes (Signed)
Pt refused and in and out catheter

## 2015-09-25 NOTE — ED Notes (Signed)
EMS was called to home for report of seizure. Pt got up to use the bathroom and her son found her on the ground "shaking."  She has a hx of stroke in 2015 which left her w/ right sided weakness and non-verbal.  She is able to answer yes and no questions by nodding.  She is alert and oriented and was not incontinent.

## 2015-09-25 NOTE — ED Provider Notes (Signed)
CSN: [id number]     Arrival date & time 09/25/15  [id number] History   First MD Initiated Contact with Patient 09/25/15 0700     Chief Complaint  Patient presents with  . Seizures     (Consider location/radiation/quality/duration/timing/severity/associated sxs/prior Treatment) Patient is a 55 y.o. female presenting with seizures. The history is provided by the patient, the EMS personnel and a relative. The history is limited by the condition of the patient.  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  Patient had gotten up to go to bathroom, family member heard patient fall, and found face down with generalized shaking, 'foaming' at mouth' - symptoms persisted 1-2 minutes, patient was unresponsive during episode, and confused for several minutes after.  On arrival to ED, patients mental status and function described by family as consistent w baseline. No hx seizures. No other recent fall or syncope. Patient had felt fine yesterday/was acting normally.  Compliant w normal meds, no recent changes. No fever or chills. No gi or gu c/o.  No incontinence. No oral injury.       Past Medical History  Diagnosis Date  . Hypertension   . Depression   . Diabetes mellitus ([place])     ?type (05/29/2013)  . Bipolar affective disorder ([place])   . Schizophrenia ([place])     [name] 11/28/2007 (05/29/2013)  . Stroke ([place]) 05/27/2013  . 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 Aphasia due to stroke   . Episodic confusion    Past Surgical History  Procedure Laterality Date  . Cesarean section  ~ 1987  . Tubal ligation    . Tee without cardioversion N/A 12/08/2013    Procedure: TRANSESOPHAGEAL ECHOCARDIOGRAM (TEE);  Surgeon: Candee Furbish, MD;  [facility] ENDOSCOPY;  Service: Cardiovascular;  Laterality: N/A;   Family History  Problem Relation Age of Onset  . Hypertension  Mother   . Stroke Father   . Hypertension Father   . Diabetes Father    Social History  Substance Use Topics  . Smoking status: Never Smoker   . Smokeless tobacco: Never Used  . Alcohol Use: No   OB History    No data available     Review of Systems  Constitutional: Negative for fever and chills.  HENT: Negative for sore throat.   Eyes: Negative for redness and visual disturbance.  Respiratory: Negative for cough and shortness of breath.   Cardiovascular: Negative for chest pain.  Gastrointestinal: Negative for vomiting, abdominal pain and diarrhea.  Genitourinary: Negative for dysuria and flank pain.  Musculoskeletal: Negative for back pain and neck pain.  Skin: Negative for wound.  Neurological: Positive for seizures. Negative for headaches.  Hematological: Does not bruise/bleed easily.  Psychiatric/Behavioral: Positive for confusion.      Allergies  Review of patient's allergies indicates no known allergies.  Home Medications   Prior to Admission medications   Medication Sig Start Date End Date Taking? Authorizing Provider  ACCU-CHEK AVIVA PLUS test strip USE 1 STRIP TO TEST BEFORE MEALS AND AT BEDTIME 07/26/15   Tresa Garter, MD  ACCU-CHEK SOFTCLIX LANCETS lancets TEST BEFORE MEALS AND AT BEDTIME 07/26/15   Tresa Garter, MD  acetaminophen (TYLENOL) 325 MG tablet Take 1-2 tablets (325-650 mg total) by mouth every 4 (four) hours as needed for mild pain (available over the counter). Patient not taking: Reported on 06/15/2015  07/08/13   Bary Leriche, PA-C  albuterol (PROVENTIL HFA;VENTOLIN HFA) 108 (90 Base) MCG/ACT inhaler Inhale 2 puffs into the lungs every 4 (four) hours as needed for wheezing or shortness of breath. 06/15/15   Francine Graven, DO  amLODipine (NORVASC) 10 MG tablet Take 1 tablet (10 mg total) by mouth daily. Must have office visit for refills 09/22/15   Tresa Garter, MD  amoxicillin-clavulanate (AUGMENTIN) 875-125 MG tablet Take 1 tablet  by mouth 2 (two) times daily. 06/18/15   Lance Bosch, NP  aspirin 325 MG tablet Take 325 mg by mouth daily.    Historical Provider, MD  baclofen (LIORESAL) 10 MG tablet TAKE 1 TABLET BY MOUTH TWO TIMES DAILY TO HELP WITH TONE/SPASMS 08/31/15   Tresa Garter, MD  feeding supplement, ENSURE ENLIVE, (ENSURE ENLIVE) LIQD Take 237 mLs by mouth 2 (two) times daily between meals. Patient not taking: Reported on 06/15/2015 11/11/14   Jonetta Osgood, MD  fluticasone [facility]) 50 MCG/ACT nasal spray Place 2 sprays into both nostrils daily. Patient not taking: Reported on 06/18/2015 03/04/15   Lance Bosch, NP  furosemide (LASIX) 20 MG tablet Take 1 tablet (20 mg total) by mouth daily. Patient not taking: Reported on 06/15/2015 04/07/15   Lance Bosch, NP  gabapentin (NEURONTIN) 100 MG capsule TAKE 1 CAPSULE BY MOUTH THREE TIMES DAILY 06/18/15   Lance Bosch, NP  hydrochlorothiazide (HYDRODIURIL) 25 MG tablet TAKE 1 TABLET BY MOUTH EVERY DAY 08/31/15   Tresa Garter, MD  Insulin Glargine (LANTUS SOLOSTAR) 100 UNIT/ML Solostar Pen Inject 10 Units into the skin daily at 10 pm. Once DAILY Patient taking differently: Inject 10 Units into the skin daily as needed (for blood sugar).  12/17/14   Lance Bosch, NP  metoprolol (LOPRESSOR) 100 MG tablet TAKE 1 TABLET BY MOUTH THREE TIMES DAILY 08/31/15   Tresa Garter, MD  polyethylene glycol powder (GLYCOLAX/MIRALAX) powder Take 17 g by mouth daily as needed (constipation). 02/22/15   Lance Bosch, NP  protein supplement shake (PREMIER PROTEIN) LIQD Take 2 oz by mouth daily.    Historical Provider, MD  Senna (CORRECTOL HERBAL TEA PO) Take 1 packet by mouth daily as needed (for constipation).    Historical Provider, MD  SENNA LAX 8.6 MG tablet TAKE 1 TABLET BY MOUTH EVERY DAY AS NEEDED FOR CONSTIPATION 06/18/15   Lance Bosch, NP  simvastatin (ZOCOR) 20 MG tablet TAKE 1 TABLET BY MOUTH EVERY DAY AT [facility] 07/26/15   Olugbemiga E Doreene Burke, MD  sorbitol  70 % solution Take 15 mLs by mouth as needed. For constipation 06/18/15   Lance Bosch, NP  Spacer/Aero-Holding Chambers (E-Z SPACER) inhaler Use as instructed 06/18/15   Lance Bosch, NP  UNIFINE PENTIPS 31G X 6 MM MISC USE AS PRESCRIBED BY PHYSICIAN 03/16/15   Lance Bosch, NP   BP 157/92 mmHg  Pulse 85  Temp(Src) 97.9 F (36.6 C) (Oral)  Resp 18  SpO2 100% Physical Exam  Constitutional: She appears well-developed and well-nourished. No distress.  HENT:  Head: Atraumatic.  Mouth/Throat: Oropharynx is clear and moist.  No oral injury.   Eyes: Conjunctivae are normal. Pupils are equal, round, and reactive to light. No scleral icterus.  Neck: Normal range of motion. Neck supple. No tracheal deviation present.  No stiffness or rigidity  Cardiovascular: Normal rate, regular rhythm, normal heart sounds and intact distal pulses.   Pulmonary/Chest: Effort normal and breath sounds normal. No respiratory  distress. She exhibits no tenderness.  Abdominal: Soft. Normal appearance and bowel sounds are normal. She exhibits no distension. There is no tenderness.  Genitourinary:  No cva tenderness  Musculoskeletal: She exhibits no edema.  CTLS spine, non tender, aligned, no step off. RUE contracture. No focal extremity pain or bony tenderness on bil ext exam.   Neurological: She is alert.  Awake and alert. Responds w brief answers to questions. Exp aphasia - speaking/responses c/w baseline per family.  Right sided weakness. Left 5/5.   Skin: Skin is warm and dry. No rash noted. She is not diaphoretic.  Psychiatric: She has a normal mood and affect.  Nursing note and vitals reviewed.   ED Course  Procedures (including critical care time) Labs Review   Results for orders placed or performed during the hospital encounter of 09/25/15  CBC  Result Value Ref Range   WBC 6.1 4.0 - 10.5 K/uL   RBC 4.40 3.87 - 5.11 MIL/uL   Hemoglobin 13.4 12.0 - 15.0 g/dL   HCT 40.9 36.0 - 46.0 %   MCV  93.0 78.0 - 100.0 fL   MCH 30.5 26.0 - 34.0 pg   MCHC 32.8 30.0 - 36.0 g/dL   RDW 12.2 11.5 - 15.5 %   Platelets 192 150 - 400 K/uL  Comprehensive metabolic panel  Result Value Ref Range   Sodium 141 135 - 145 mmol/L   Potassium 3.5 3.5 - 5.1 mmol/L   Chloride 105 101 - 111 mmol/L   CO2 26 22 - 32 mmol/L   Glucose, Bld 128 (H) 65 - 99 mg/dL   BUN 14 6 - 20 mg/dL   Creatinine, Ser 0.95 0.44 - 1.00 mg/dL   Calcium 9.4 8.9 - 10.3 mg/dL   Total Protein 6.5 6.5 - 8.1 g/dL   Albumin 3.6 3.5 - 5.0 g/dL   AST 23 15 - 41 U/L   ALT 20 14 - 54 U/L   Alkaline Phosphatase 52 38 - 126 U/L   Total Bilirubin 0.5 0.3 - 1.2 mg/dL   GFR calc non Af Amer >60 >60 mL/min   GFR calc Af Amer >60 >60 mL/min   Anion gap 10 5 - 15   Ct Head Wo Contrast  09/25/2015  CLINICAL DATA:  Seizure with right-sided weakness EXAM: CT HEAD WITHOUT CONTRAST TECHNIQUE: Contiguous axial images were obtained from the base of the skull through the vertex without intravenous contrast. COMPARISON:  Head CT and brain MRI January 08, 2015 FINDINGS: Brain: There is evidence of encephalomalacia involving the left lateral ventricle, stable. Other ventricles are normal in size and configuration. There is slight midline shift toward the left due to encephalomalacia, chronic and stable. There is no mass, hemorrhage, or extra-axial fluid collection. There is evidence of prior infarcts in the posterior left frontal lobe as well as much of the superior left temporal lobe as well as a portion of the anterior left parietal lobe, stable. There is small vessel disease in the centra semiovale bilaterally. Small vessel disease is also noted in the superior aspect of the right lentiform nucleus. Prior infarct is noted involving most of the left thalamus. There is a prior small lacunar infarct in the right thalamus. No acute infarct is evident. Vascular: There is calcification in the cavernous carotid arteries bilaterally. Skull:  Bony calvarium appears  intact. Sinuses/Orbits: There is mild mucosal thickening in the right maxillary antrum. Other paranasal sinuses are clear. Visualized orbits appear symmetric bilaterally. Other: Mastoid air cells clear. IMPRESSION: Stable  prior infarcts as noted above. Largest infarct involves a significant portion of the left middle cerebral artery distribution. There is encephalomalacia on the left with mild midline shift toward the left, chronic, due to the encephalomalacia. No acute infarct is evident on this study. No hemorrhage or mass. No extra-axial fluid. Areas of vascular calcification noted. Electronically Signed   By: Lowella Grip III M.D.   On: 09/25/2015 09:08      I have personally reviewed and evaluated these images and lab results as part of my medical decision-making.   EKG Interpretation   Date/Time:  Saturday September 25 2015 07:12:21 EDT Ventricular Rate:  80 PR Interval:  150 QRS Duration: 86 QT Interval:  394 QTC Calculation: 454 R Axis:   59 Text Interpretation:  Normal sinus rhythm No significant change since last  tracing Confirmed by Hector Venne  MD, Lennette Bihari ([id number]) on 09/25/2015 7:25:06 AM      MDM   Iv ns. Continuous pulse ox and monitor.  Seizure precautions.  Reviewed nursing notes and prior charts for additional history.   Discussed pt with neurology on call, Dr Shon Hale, and pts hx, he indicates would recommend starting on Keppra 500 mg bid, and outpatient neurology f/u.  Recheck pt, mental status/exam remains at baseline. No recurrent seizure activity. Pt denies any c/o.  keppra 500 mg iv.   Family concerned re bp high - pt has not had any of her bp meds yet today, patient given dose of her bp meds.  Pt refusing ua/cath for ua. No fevers. Wbc normal.     Lajean Saver, MD 09/25/15 1151

## 2015-09-27 ENCOUNTER — Telehealth: Payer: Self-pay | Admitting: Neurology

## 2015-09-27 ENCOUNTER — Other Ambulatory Visit: Payer: Self-pay | Admitting: Physical Medicine & Rehabilitation

## 2015-09-27 NOTE — Telephone Encounter (Signed)
Daughter Radene Journey [phone] called to request appointment for seizure follow up, patient had seizure July 8th, was transported to [facility] ED. Appointment scheduled for Wednesday July 12th.

## 2015-09-27 NOTE — Telephone Encounter (Signed)
Message sent to Dr.Xu. 

## 2015-09-27 NOTE — Telephone Encounter (Signed)
Thanks for the appointment. Also please keep the 01/2016 appointment too. Thanks.   Rosalin Hawking, MD PhD Stroke Neurology 09/27/2015 2:49 PM

## 2015-09-28 ENCOUNTER — Telehealth: Payer: Self-pay | Admitting: Internal Medicine

## 2015-09-28 ENCOUNTER — Other Ambulatory Visit: Payer: Self-pay | Admitting: Pharmacist

## 2015-09-28 MED ORDER — AMLODIPINE BESYLATE 10 MG PO TABS: 10.0000 mg | ORAL_TABLET | Freq: Every day | ORAL | Status: DC

## 2015-09-28 NOTE — Telephone Encounter (Signed)
Nurse from [facility] called requesting to speak to nurse regarding bp reading. Pt has been without medication for over a week. Reading today 159/103 please f/up.

## 2015-09-29 ENCOUNTER — Ambulatory Visit (INDEPENDENT_AMBULATORY_CARE_PROVIDER_SITE_OTHER): Payer: Medicaid Other | Admitting: Neurology

## 2015-09-29 ENCOUNTER — Encounter: Payer: Self-pay | Admitting: Neurology

## 2015-09-29 VITALS — BP 136/89 | HR 67 | Ht 65.0 in

## 2015-09-29 DIAGNOSIS — R569 Unspecified convulsions: Secondary | ICD-10-CM | POA: Diagnosis not present

## 2015-09-29 DIAGNOSIS — I63512 Cerebral infarction due to unspecified occlusion or stenosis of left middle cerebral artery: Secondary | ICD-10-CM

## 2015-09-29 DIAGNOSIS — E1149 Type 2 diabetes mellitus with other diabetic neurological complication: Secondary | ICD-10-CM | POA: Diagnosis not present

## 2015-09-29 DIAGNOSIS — R4701 Aphasia: Secondary | ICD-10-CM | POA: Diagnosis not present

## 2015-09-29 DIAGNOSIS — G8110 Spastic hemiplegia affecting unspecified side: Secondary | ICD-10-CM | POA: Diagnosis not present

## 2015-09-29 DIAGNOSIS — I10 Essential (primary) hypertension: Secondary | ICD-10-CM

## 2015-09-29 MED ORDER — LACOSAMIDE 100 MG PO TABS: 100.0000 mg | ORAL_TABLET | Freq: Two times a day (BID) | ORAL | Status: DC

## 2015-09-29 NOTE — Progress Notes (Signed)
PATIENT: [name] DOB: 04-19-1960  REASON FOR VISIT: Hospital follow up for stroke HISTORY FROM: daughter and patient  HISTORY OF PRESENT ILLNESS: 55 y.o. [name] female who comes to the office for first hospital follow up post hospital discharge for stroke. She has a history of HTN, DM, bipolar disorder, who was admitted on 05/27/13 with right facial droop and aphasia. 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 Infarcts felt to be embolic due to unknown source and she was placed on ASA for secondary stroke prevention.  Patient had progressive neurologic worsening on 05/29/13 pm with decrease in LOC and right sided weakness. EEG negative for seizures and CCT without significant changes; felt patient with progression of symptoms due to relative hypotension and recommended allowing significantly high permissive hypertension in this patient. Hgb A1c was 14.1 in hospital, now 5.9.  LDL was 130. Hypercoagulable workup negative. 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 No other intracranial flow-limiting stenosis or embolic disease evident. Areas of low density in the left MCA territory consistent with infarction. 2D Echocardiogram with EF of 55-60% with no source of embolus. TEE was not done due to hypotension in hospital. Patient with resultant right hemiparesis and aphasia.  Has had PEG tube removed by radiology in June. Eating well and drinking well. Remains aphasic. Contemplating Botox injections to the wrist and finger flexors, hamstrings, and gastrocnemius by Dr.  Letta Pate. Using Baclofen for spasticity and gabapentin for pain. 103 year old daughter is providing all care.   01/05/14 follow up - she still takes full dose ASA and zocor for stroke prevention. Follows with PCP for BP and DM. RLE muscle strength improved but RUE still flaccid. Remains aphasic with only "Thank you" and "yes/no". She had inpt rehab followed by home PT/OT but then discharged from home PT/OT but no outpt PT/OT initiated yet. Daughter is going to check the status of outpt PT/OT.  04/02/14 follow up - patient has been doing the same. Still has significant aphasia and RUE weakness, but she was able to walk with semi-walker now. She follows with Dr. Read Drivers and will start PT and botox injection again. Her loop recorder never put in, will have to refer her again. She went to ER on 03/17/14 for episode of holding head, do not respond on commands, keep sticking in and out of her tougue for about 5 min, no shaking jerking or LOC. Daughter concerned and pt sent to ER, repeat CT no acute changes and pt back to baseline.   07/09/14 follow up - patient has been doing the same. She was referred to Dr. Rayann Heman for loop recorder placement, however she refused again. Blood pressure 122/82 in clinic today, she still has significant aphasia and right sided weakness. As per daughter, patient recently started to have staring spells with drooling at the right mouth, no shaking jerking, tongue biting, b/b incontinence, lasting several minutes.  02/02/15 follow up - pt has been doing the same. Had EEG in office which was negative for seizure. I offered home EEG long term monitoring but not done yet and I think daughter declined that test.  Daughter also declined seizure medication at that time. However, as per daughter, pt has no more episodes like before. She continued follow up with Dr. Read Drivers and will have botox for spasticity. She still has PT/OT to work with her.   Interval history During the interval time,  she had ER visit on 09/25/15 due to seizure. She went to bathroom, family member heard patient fall, and found her face down with generalized shaking, 'foaming' at mouth' - symptoms persisted 1-2 minutes, patient was unresponsive during episode, and confused for several minutes after. On arrival to ED, patients mental status and function described by family as consistent w baseline. She was given keppra 500mg  IV. She was d/c from ER with prescription of keppra.  Daughter has not filled the medication yet. However, stated that pt was drowsy sleepy since the seizure but easily arousable. At baseline, she has intermittent confusion and agitation. BP recently high, on amlodipine, HCTZ and metoprolol. Today 136/89. Stated that glucose controlled well.   REVIEW OF SYSTEMS: Full 14 system review of systems performed and notable only for: eye itching, ringing in ears, runny nose, light sensitivity, eye pain, leg swelling, cold intolerance, excessive thirst, constipation, frequent waking, joint pain, aching muscles, walking difficulty, speech difficulty, weakness, agitation and confusion.  ALLERGIES: No Known Allergies  HOME MEDICATIONS: Outpatient Prescriptions Prior to Visit  Medication Sig Dispense Refill  . ACCU-CHEK AVIVA PLUS test strip USE 1 STRIP TO TEST BEFORE MEALS AND AT BEDTIME 100 each 2  . ACCU-CHEK SOFTCLIX LANCETS lancets TEST BEFORE MEALS AND AT BEDTIME 100 each 2  . acetaminophen (TYLENOL) 325 MG tablet Take 1-2 tablets (325-650 mg total) by mouth every 4 (four) hours as needed for mild pain (available over the counter).    Marland Kitchen albuterol (PROVENTIL HFA;VENTOLIN HFA) 108 (90 Base) MCG/ACT inhaler Inhale 2 puffs into the lungs every 4 (four) hours as needed for wheezing or shortness of breath. 1 Inhaler 0  . amLODipine (NORVASC) 10 MG tablet Take 1 tablet (10 mg total) by mouth daily. Must have office visit for refills 30 tablet 0  . amoxicillin-clavulanate (AUGMENTIN) 875-125 MG tablet Take 1  tablet by mouth 2 (two) times daily. 14 tablet 0  . aspirin 325 MG tablet Take 325 mg by mouth daily. Reported on 09/25/2015    . baclofen (LIORESAL) 10 MG tablet TAKE 1 TABLET BY MOUTH TWO TIMES DAILY TO HELP WITH TONE/SPASMS 60 tablet 2  . feeding supplement, ENSURE ENLIVE, (ENSURE ENLIVE) LIQD Take 237 mLs by mouth 2 (two) times daily between meals.    . fluticasone (FLONASE) 50 MCG/ACT nasal spray Place 2 sprays into both nostrils daily. (Patient taking differently: Place 2 sprays into both nostrils daily as needed for allergies. ) 16 g 6  . gabapentin (NEURONTIN) 100 MG capsule TAKE 1 CAPSULE BY MOUTH THREE TIMES DAILY 90 capsule 2  . hydrochlorothiazide (HYDRODIURIL) 25 MG tablet TAKE 1 TABLET BY MOUTH EVERY DAY 30 tablet 2  . Insulin Glargine (LANTUS SOLOSTAR) 100 UNIT/ML Solostar Pen Inject 10 Units into the skin daily at 10 pm. Once DAILY (Patient taking differently: Inject 10 Units into the skin daily as needed (for blood sugar). ) 5 pen 11  . metoprolol (LOPRESSOR) 100 MG tablet TAKE 1 TABLET BY MOUTH THREE TIMES DAILY 90 tablet 2  . polyethylene glycol powder (GLYCOLAX/MIRALAX) powder Take 17 g by mouth daily as needed (constipation). 527 g 3  . protein supplement shake (PREMIER PROTEIN) LIQD Take 2 oz by mouth daily.    Marland Kitchen  SENNA LAX 8.6 MG tablet TAKE 1 TABLET BY MOUTH EVERY DAY AS NEEDED FOR CONSTIPATION 30 tablet 2  . simvastatin (ZOCOR) 20 MG tablet TAKE 1 TABLET BY MOUTH EVERY DAY AT 6PM 30 tablet 2  . Spacer/Aero-Holding Chambers (E-Z SPACER) inhaler Use as instructed 1 each 2  . UNIFINE PENTIPS 31G X 6 MM MISC USE AS PRESCRIBED BY PHYSICIAN 100 each 11  . levETIRAcetam (KEPPRA) 500 MG tablet Take 1 tablet (500 mg total) by mouth 2 (two) times daily. (Patient not taking: Reported on 09/29/2015) 60 tablet 0  . furosemide (LASIX) 20 MG tablet Take 1 tablet (20 mg total) by mouth daily. (Patient not taking: Reported on 09/29/2015) 3 tablet 0  . sorbitol 70 % solution Take 15 mLs by mouth  as needed. For constipation (Patient not taking: Reported on 09/29/2015) 240 mL 0   No facility-administered medications prior to visit.    PHYSICAL EXAM Filed Vitals:   09/29/15 1101  BP: 136/89  Pulse: 67  Height: 5\' 5"  (1.651 m)   There is no weight on file to calculate BMI. No exam data present  Generalized: Well developed, in no acute distress  Head: normocephalic and atraumatic. Oropharynx benign  Neck: Supple, no carotid bruits  Cardiac: Regular rate rhythm, no murmur   Neurological examination  Mentation: Orientation unable to assess due to aphasia.  Severe expressive aphasia with only "Thank you" and no sentences. Able to comprehend 1-2 simple central commands, but not peripheral commands. Unable to name or repeat.  Cranial nerve II-XII: Fundoscopic exam not able to see through. Pupils were equal round reactive to light, extraocular movements were full.  Facial droop on the right, normal on the left. Hearing was intact to finger rubbing bilaterally. Tongue in middle. Head turning and shoulder shrug are asymmetric, impaired on right. Motor: Right upper extremity distal strength 0/5, proximal 2/5, right lower extremity strength is 4-/5 proxima and 3-/5 distal. Left upper and lower extremity strength 5/5. Increased tone on the right Sensory: Sensory testing is intact to soft touch LUE and LLE. Evidence of extinction is noted on the right. Coordination: Cerebellar testing not able to do on the right. Gait and station: Gait is not tested, in wheelchair Reflexes: Deep tendon reflexes are symmetric and decreased bilaterally.   I have personally reviewed the radiological images below and agree with the radiology interpretations.  EEG 05/2013 - This is an abnormal EEG secondary to slowing over the left hmeisphere with frequent left hemispheric sharp acitivty as well.. This finding is suggestive of a focal disturbance that is etiologically nonspecific, but may include a mass lesion or  post-ictal focal slowing among other possibilities.   CT of the brain 05/27/2013 No acute intracranial pathology.   CT brain 03/17/14 - No acute abnormality. 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  CTA head 05/2013- Severe stenosis of the left middle cerebral artery at the M1 segment with markedly diminished visualization of the more distal left MCA branches. No other intracranial flow-limiting stenosis or embolic disease evident. Areas of low density in the left MCA territory consistent with infarction. No hemorrhagic transformation. Question new low density in the pons, not definite.  MRI of the brain 05/27/2013 Multiple foci of acute ischemia within the left middle cerebral artery territory. In addition, however punctate focus of acute ischemia within the left occipital lobe and right thalamus (both of which would be posterior circulation). All areas of ischemia have similar acuity. No hemorrhagic conversion. Symmetric abnormal bright signal within the lenticulostriate  nuclei (basal ganglia) in addition to abnormal temporoccipital periventricular T2 hyperintense signal. These findings may reflect sequelae of acute severe hypertension (PRES) but, it can also be seen with osmotic demyelination, autoimmune disorders such as scleroderma (which can have associated vasculopathy), less likely infectious encephalitis. Moderate to severe white matter changes, in a distribution which can be seen with demyelination, with superimposed possible chronic small vessel ischemic disease though, are nonspecific.   MRI brain 12/05/13 - No evidence of acute ischemia, specifically no acute LEFT sided stroke. Remote large LEFT middle cerebral artery territory hemorrhagic infarct. Similar abnormal signal within the basal ganglia and pons could reflect metabolic disorder, sequelae of possible PRES, less likely pontine/extra pontine myelinolysis, findings are similar. Remote bilateral thalamus lacunar  infarcts. Moderate white matter changes suggest chronic small vessel ischemic disease.  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 Mild luminal irregularity of the intracranial vessels which can be seen with intracranial atherosclerosis though, may also be seen with vasculopathy.   2D Echocardiogram EF 55-60% with no source of embolus.   Carotid Doppler No evidence of hemodynamically significant internal carotid artery stenosis. Vertebral artery flow is antegrade.   TCD emboli detection -  Negative  EEG 07/13/14 - This is a mildly abnormal EEG due to the presence of left hemispheric slowing and focal left frontotemporal irritability which may be compatible with patient's history of large left hemispheric infarct. No definite epileptiform activity is noted. Overall no significant change compared with EEG dated 05/31/14.  Component     Latest Ref Rng 05/28/2013 05/30/2013 08/22/2013 12/05/2013  PTT Lupus Anticoagulant     28.0 - 43.0 secs 37.9     PTTLA Confirmation     <8.0 secs NOT APPL     PTTLA 4:1 Mix     28.0 - 43.0 secs NOT APPL     DRVVT     <42.9 secs 35.1     Drvvt confirmation     <1.11 Ratio NOT APPL     dRVVT Incubated 1:1 Mix     <42.9 secs NOT APPL     Lupus Anticoagulant     NOT DETECTED NOT DETECTED     Cholesterol     0 - 200 mg/dL 201 (H)     Triglycerides     <150 mg/dL 145     HDL     >39 mg/dL 42     Total CHOL/HDL Ratio      4.8     VLDL     0 - 40 mg/dL 29     LDL (calc)     0 - 99 mg/dL 130 (H)     Anticardiolipin IgG     <23 GPL U/mL 5 (L)     Anticardiolipin IgM     <11 MPL U/mL 2 (L)     Anticardiolipin IgA     <22 APL U/mL 10 (L)     Hgb A1c MFr Bld      14.6 (H) 14.1 (H) 6.8   Mean Plasma Glucose     <117 mg/dL 372 (H) 358 (H)    C3 Complement     90 - 180 mg/dL 115     Complement C4, Body Fluid     10 - 40 mg/dL 31     Compl, Total (CH50)     31 - 60 U/mL >60 (H)     ANA Ser Ql     NEGATIVE  NEGATIVE     RPR  NON REACTIVE NON REACTIVE     Sed Rate     0 - 22 mm/hr 25 (H)     AntiThromb III Func     75 - 120 % 125 (H)     Protein C Activity     75 - 133 % 200 (H)     Protein C, Total     72 - 160 % 165 (H)     Protein S Activity     69 - 129 % 120     Protein S Ag, Total     60 - 150 % 133     Homocysteine     4.0 - 15.4 umol/L 8.0     Recommendations-PTGENE:      (NOTE)     HIV     NON REACTIVE NON REACTIVE     TSH     0.350 - 4.500 uIU/mL    1.690    ASSESSMENT: 55 y.o. year old [place] female  has a past medical history of Hypertension; Depression, Diabetes mellitus; Bipolar affective disorder, Schizophrenia followed up in clinic for Left MCA distribution infarct from 05/27/13. 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 MRA showed left MCA occlusion. Infarcts felt to be embolic secondary to unknown source, although not able to rule out large vessel asthero due to severe HTN and uncontrolled DM.  Stroke workup negative including carotid Doppler, TCD MES, hypercoagulable workup. Patient refused loop recorder. Residual right hemiparesis and severe aphasia. Needs assistance with all ADL's. Currently BP and glucose in good control. She had a EEG in March 2015 which was negative for seizure but left hemisphere slowing but with frequent sharp activity that has some spread to the right frontal regions at times. Pt had staring spells with drooling early 2016 and repeat EEG negative for seizure but left hemisphere slowing and focal left frontotemporal frequent sharp activity.  Long term EEG and seizure medication declined by daughter in the past. Pt had GTC seizure 09/25/15 and was given one dose keppra. Due to baseline sleepiness and intermittent confusion agitation, will start vimpat for seizure control.   PLAN: - continue ASA and zocor for stroke  prevention  - will start vimpat for seizure control. - maintain seizure precautions. - will repeat EEG next visit while on vimpat - follow up with Dr. Read Drivers for rehab - Follow up with primary care physician for stroke risk factor modification. Recommend maintain blood pressure goal <130/80, diabetes with hemoglobin A1c goal below 6.5% and lipids with LDL cholesterol goal below 70 mg/dL.  - check BP and glucose at home and record - follow up in 3 months.  I spent more than 25 minutes of face to face time with the patient. Greater than 50% of time was spent in counseling and coordination of care. We have discussed about seizure medication, seizure control and precautions.   No orders of the defined types were placed in this encounter.     Meds ordered this encounter  Medications  . Lacosamide 100 MG TABS    Sig: Take 1 tablet (100 mg total) by mouth 2 (two) times daily.    Dispense:  60 tablet    Refill:  3    Patient Instructions  - continue ASA and zocor for stroke prevention  - will start vimpat for seizure control. Will sent the prescription to pharmacy. - will  repeat EEG next visit - follow up with Dr. Read Drivers for botox and rehab - Follow up with primary care physician for stroke risk factor modification. Recommend maintain blood pressure goal <130/80, diabetes with hemoglobin A1c goal below 6.5% and lipids with LDL cholesterol goal below 70 mg/dL.  - check BP and glucose at home and record - follow up in 3 months.   Rosalin Hawking, MD PhD [facility] [address], [place], [place] [address] [phone]

## 2015-09-29 NOTE — Patient Instructions (Signed)
-   continue ASA and zocor for stroke prevention  - will start vimpat for seizure control. Will sent the prescription to pharmacy. - will repeat EEG next visit - follow up with Dr. Read Drivers for botox and rehab - Follow up with primary care physician for stroke risk factor modification. Recommend maintain blood pressure goal <130/80, diabetes with hemoglobin A1c goal below 6.5% and lipids with LDL cholesterol goal below 70 mg/dL.  - check BP and glucose at home and record - follow up in 3 months.

## 2015-09-30 ENCOUNTER — Telehealth: Payer: Self-pay

## 2015-09-30 NOTE — Telephone Encounter (Signed)
RN call [facility] at [phone]. Vimpat was approve from 09/30/2015 to 09/24/2016. PA number is [id number].Marland Kitchen

## 2015-10-05 ENCOUNTER — Telehealth: Payer: Self-pay | Admitting: Neurology

## 2015-10-05 NOTE — Telephone Encounter (Signed)
Rn call patients daughter [name] back about the vimpat and not being able to afford it. Rn stated the PA for the vimpat was approve on 09/30/2015. PTs daughter stated after she call our office the pharmacy stated it would be deliver to her husband this week. PTs daughter stated it was approve. Rn requested she call back for any concerns or questions about the medication.

## 2015-10-05 NOTE — Telephone Encounter (Signed)
Pt's daughter, Radene Journey, called stating Vimpat is not covered by pts insurance. Is there another medication? Please call and advise

## 2015-10-14 ENCOUNTER — Other Ambulatory Visit: Payer: Self-pay | Admitting: Internal Medicine

## 2015-10-14 NOTE — Telephone Encounter (Signed)
Rx Requests 

## 2015-10-25 ENCOUNTER — Other Ambulatory Visit: Payer: Medicaid Other

## 2015-10-27 ENCOUNTER — Other Ambulatory Visit: Payer: Self-pay | Admitting: Internal Medicine

## 2015-10-29 NOTE — Telephone Encounter (Signed)
Review of note states office Progress Notes by Rosalin Hawking, MD at 09/29/2015 , bp acceptable.  No further action needed. Priscille Heidelberg, RN, BSN

## 2015-11-09 ENCOUNTER — Encounter: Payer: Self-pay | Admitting: Sports Medicine

## 2015-11-09 ENCOUNTER — Ambulatory Visit (INDEPENDENT_AMBULATORY_CARE_PROVIDER_SITE_OTHER): Payer: Medicaid Other | Admitting: Sports Medicine

## 2015-11-09 DIAGNOSIS — E1142 Type 2 diabetes mellitus with diabetic polyneuropathy: Secondary | ICD-10-CM

## 2015-11-09 DIAGNOSIS — Z8673 Personal history of transient ischemic attack (TIA), and cerebral infarction without residual deficits: Secondary | ICD-10-CM

## 2015-11-09 DIAGNOSIS — B351 Tinea unguium: Secondary | ICD-10-CM

## 2015-11-09 DIAGNOSIS — M79676 Pain in unspecified toe(s): Secondary | ICD-10-CM

## 2015-11-09 DIAGNOSIS — M79675 Pain in left toe(s): Secondary | ICD-10-CM

## 2015-11-09 DIAGNOSIS — B353 Tinea pedis: Secondary | ICD-10-CM

## 2015-11-09 DIAGNOSIS — R600 Localized edema: Secondary | ICD-10-CM

## 2015-11-09 DIAGNOSIS — M79674 Pain in right toe(s): Secondary | ICD-10-CM

## 2015-11-09 NOTE — Progress Notes (Signed)
Patient ID: [name], female   DOB: 1960-09-14, 55 y.o.   MRN: [id number]   Subjective: [name] is a 55 y.o. female patient with history of type 2 diabetes who presents to office today complaining of long, painful nails; unable to trim. Patient states that the glucose reading this morning was good. Patient is assisted by daughter who helps to report this since patient can not clearly talk since after stroke. Patient's daughter denies any new changes in medication or new problems. Patient denies any new cramping, numbness, burning or tingling in the legs.  Patient Active Problem List   Diagnosis Date Noted  . Seizures ([place]) 09/29/2015  . Urine leukocytes 01/08/2015  . Expressive aphasia 01/08/2015  . Malnutrition of moderate degree ([place]) 11/11/2014  . Syncope 11/08/2014  . Dehydration 11/08/2014  . Staring spell 07/09/2014  . Adhesive capsulitis of right shoulder 06/04/2014  . Palpitations 04/02/2014  . Essential hypertension 01/05/2014  . Type 2 diabetes mellitus with other diabetic neurological complication ([place]) [id number]  . Paranoid schizophrenia ([place]) 12/28/2013  . Cerebral embolism with cerebral infarction ([place]) 12/05/2013  . Sinus tachycardia ([place]) 12/05/2013  . 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 Spastic hemiplegia affecting dominant side ([place]) 09/12/2013  . CVA (cerebral infarction) 06/04/2013  . Dysphagia 06/02/2013  . Benign polycythemia 06/02/2013  . Malignant hypertension 05/28/2013  . DM (diabetes mellitus) type II controlled, neurological manifestation ([place]) 05/27/2013  . Acute embolic stroke ([place]) [id number]  . 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 Hypertensive emergency 05/27/2013  . Stroke ([place]) 05/27/2013  . Aphasia complicating stroke [id number]   Current Outpatient Prescriptions on File Prior to Visit  Medication Sig Dispense Refill  .  ACCU-CHEK AVIVA PLUS test strip USE 1 STRIP TO TEST BEFORE MEALS AND AT BEDTIME 100 each 2  . ACCU-CHEK SOFTCLIX LANCETS lancets TEST BEFORE MEALS AND AT BEDTIME 100 each 2  . acetaminophen (TYLENOL) 325 MG tablet Take 1-2 tablets (325-650 mg total) by mouth every 4 (four) hours as needed for mild pain (available over the counter).    Marland Kitchen albuterol (PROVENTIL HFA;VENTOLIN HFA) 108 (90 Base) MCG/ACT inhaler Inhale 2 puffs into the lungs every 4 (four) hours as needed for wheezing or shortness of breath. 1 Inhaler 0  . amLODipine (NORVASC) 10 MG tablet Take 1 tablet (10 mg total) by mouth daily. 30 tablet 0  . amoxicillin-clavulanate (AUGMENTIN) 875-125 MG tablet Take 1 tablet by mouth 2 (two) times daily. 14 tablet 0  . aspirin 325 MG tablet Take 325 mg by mouth daily. Reported on 09/25/2015    . baclofen (LIORESAL) 10 MG tablet TAKE 1 TABLET BY MOUTH TWO TIMES DAILY TO HELP WITH TONE/SPASMS 60 tablet 2  . feeding supplement, ENSURE ENLIVE, (ENSURE ENLIVE) LIQD Take 237 mLs by mouth 2 (two) times daily between meals.    . fluticasone (FLONASE) 50 MCG/ACT nasal spray Place 2 sprays into both nostrils daily. (Patient taking differently: Place 2 sprays into both nostrils daily as needed for allergies. ) 16 g 6  . gabapentin (NEURONTIN) 100 MG capsule TAKE 1 CAPSULE BY MOUTH THREE TIMES DAILY 90 capsule 2  . hydrochlorothiazide (HYDRODIURIL) 25 MG tablet TAKE 1 TABLET BY MOUTH EVERY DAY 30 tablet 2  . Insulin Glargine (LANTUS SOLOSTAR) 100 UNIT/ML Solostar Pen Inject 10 Units into the skin daily at 10 pm. Once DAILY (Patient taking differently: Inject 10 Units into the skin daily as needed (  for blood sugar). ) 5 pen 11  . Lacosamide 100 MG TABS Take 1 tablet (100 mg total) by mouth 2 (two) times daily. 60 tablet 3  . levETIRAcetam (KEPPRA) 500 MG tablet Take 1 tablet (500 mg total) by mouth 2 (two) times daily. (Patient not taking: Reported on 09/29/2015) 60 tablet 0  . metoprolol (LOPRESSOR) 100 MG tablet TAKE  1 TABLET BY MOUTH THREE TIMES DAILY 90 tablet 2  . polyethylene glycol powder (GLYCOLAX/MIRALAX) powder Take 17 g by mouth daily as needed (constipation). 527 g 3  . protein supplement shake (PREMIER PROTEIN) LIQD Take 2 oz by mouth daily.    . SENNA LAX 8.6 MG tablet TAKE 1 TABLET BY MOUTH EVERY DAY AS NEEDED FOR CONSTIPATION 30 tablet 2  . simvastatin (ZOCOR) 20 MG tablet TAKE 1 TABLET BY MOUTH EVERY DAY AT 6PM 30 tablet 2  . Spacer/Aero-Holding Chambers (E-Z SPACER) inhaler Use as instructed 1 each 2  . UNIFINE PENTIPS 31G X 6 MM MISC USE AS PRESCRIBED BY PHYSICIAN 100 each 11   No current facility-administered medications on file prior to visit.    No Known Allergies   Objective: General: Patient is awake, alert, and oriented x 2 and in no acute distress.  Integument: Skin is warm, dry and supple bilateral. Nails are tender, long, thickened and dystrophic with subungual debris, consistent with onychomycosis, 1-5 bilateral. Mild interdigital macerations 1-4 bilateral, No signs of infection. No open lesions or preulcerative lesions present bilateral. Remaining integument unremarkable.  Vasculature:  Dorsalis Pedis pulse 0/4 bilateral. Posterior Tibial pulse  1/4 bilateral. Capillary fill time <3 sec 1-5 bilateral. No hair growth to the level of the digits.Temperature gradient within normal limits. No varicosities present bilateral. +1 pitting edema present bilateral.   Neurology: The patient has diminished sensation measured with a 5.07/10g Semmes Weinstein Monofilament at toes bilateral . Vibratory sensation diminished bilateral with tuning fork. No Babinski sign present bilateral.   Musculoskeletal: No gross pedal deformities noted bilateral. Muscular strength 4/5 in all lower extremity muscular groups on left, 3/5 on right without pain on range of motion; Stroke with right sided weakness. No tenderness with calf compression bilateral.  Assessment and Plan: Problem List Items Addressed  This Visit    None    Visit Diagnoses    Dermatophytosis of nail    -  Primary   Tinea pedis of both feet       Pain in toes of both feet       Diabetic polyneuropathy associated with type 2 diabetes mellitus (HCC)       Localized edema       History of stroke         -Examined patient. -Discussed and educated patient on diabetic foot care, especially with  regards to the vascular, neurological and musculoskeletal systems.  -Stressed the importance of good glycemic control and the detriment of not  controlling glucose levels in relation to the foot. -Mechanically debrided all nails 1-5 bilateral using sterile nail nipper and filed with dremel without incident  -Cont with good hygiene and Gold bond foot powder as needed to prevent tinea -Answered all patient questions -Patient to return in 3 months for at risk foot care -Patient advised to call the office if any problems or questions arise in the meantime.  Landis Martins, DPM

## 2015-11-11 ENCOUNTER — Other Ambulatory Visit: Payer: Self-pay | Admitting: Internal Medicine

## 2015-11-24 ENCOUNTER — Other Ambulatory Visit: Payer: Self-pay | Admitting: Internal Medicine

## 2015-12-01 ENCOUNTER — Other Ambulatory Visit: Payer: Self-pay | Admitting: Internal Medicine

## 2015-12-03 ENCOUNTER — Other Ambulatory Visit: Payer: Self-pay | Admitting: Internal Medicine

## 2015-12-08 ENCOUNTER — Other Ambulatory Visit: Payer: Self-pay | Admitting: Internal Medicine

## 2015-12-22 ENCOUNTER — Other Ambulatory Visit: Payer: Self-pay | Admitting: Internal Medicine

## 2015-12-24 ENCOUNTER — Ambulatory Visit (HOSPITAL_BASED_OUTPATIENT_CLINIC_OR_DEPARTMENT_OTHER): Payer: Medicaid Other | Admitting: Physical Medicine & Rehabilitation

## 2015-12-24 ENCOUNTER — Encounter: Payer: Medicaid Other | Attending: Physical Medicine & Rehabilitation

## 2015-12-24 ENCOUNTER — Encounter: Payer: Self-pay | Admitting: Physical Medicine & Rehabilitation

## 2015-12-24 VITALS — BP 158/94 | HR 81 | Resp 14

## 2015-12-24 DIAGNOSIS — E119 Type 2 diabetes mellitus without complications: Secondary | ICD-10-CM | POA: Insufficient documentation

## 2015-12-24 DIAGNOSIS — I639 Cerebral infarction, unspecified: Secondary | ICD-10-CM | POA: Insufficient documentation

## 2015-12-24 DIAGNOSIS — R4701 Aphasia: Secondary | ICD-10-CM

## 2015-12-24 DIAGNOSIS — G8110 Spastic hemiplegia affecting unspecified side: Secondary | ICD-10-CM | POA: Insufficient documentation

## 2015-12-24 DIAGNOSIS — Z8249 Family history of ischemic heart disease and other diseases of the circulatory system: Secondary | ICD-10-CM | POA: Insufficient documentation

## 2015-12-24 DIAGNOSIS — Z823 Family history of stroke: Secondary | ICD-10-CM | POA: Diagnosis not present

## 2015-12-24 DIAGNOSIS — Z833 Family history of diabetes mellitus: Secondary | ICD-10-CM | POA: Insufficient documentation

## 2015-12-24 DIAGNOSIS — Z9851 Tubal ligation status: Secondary | ICD-10-CM | POA: Diagnosis not present

## 2015-12-24 DIAGNOSIS — I10 Essential (primary) hypertension: Secondary | ICD-10-CM | POA: Diagnosis not present

## 2015-12-24 DIAGNOSIS — IMO0002 Reserved for concepts with insufficient information to code with codable children: Secondary | ICD-10-CM

## 2015-12-24 DIAGNOSIS — Z8673 Personal history of transient ischemic attack (TIA), and cerebral infarction without residual deficits: Secondary | ICD-10-CM | POA: Insufficient documentation

## 2015-12-24 NOTE — Patient Instructions (Signed)
Patient would benefit from Botox to relax the hand and wrist. Patient will call to notify if she would like to schedule

## 2015-12-24 NOTE — Progress Notes (Signed)
Subjective:    Patient ID: [name], female    DOB: 1961-01-11, 55 y.o.   MRN: [id number]  HPI 55 year old female with history of left MCA infarct causing right spastic hemiplegia onset 05/27/2013. She is living at home in a handicapped accessible apartment with caregiver support.  Interval history positive for seizure, this was associated with a fall. She was prescribed Vimpat by neurology, but never took this. Daughter and patient felt that the fall caused her seizure. She will follow-up with neurology on 10/19 Pain Inventory Average Pain 9 Pain Right Now 7 My pain is constant, sharp, burning, stabbing, tingling and aching  In the last 24 hours, has pain interfered with the following? General activity 6 Relation with others 7 Enjoyment of life 5 What TIME of day is your pain at its worst? All Sleep (in general) Fair  Pain is worse with: walking, bending, sitting and standing Pain improves with: rest and medication Relief from Meds: 10  Mobility walk with assistance use a walker ability to climb steps?  no do you drive?  no  Function disabled: date disabled 2015 I need assistance with the following:  all Do you have any goals in this area?  yes  Neuro/Psych trouble walking  Prior Studies Any changes since last visit?  no  Physicians involved in your care Any changes since last visit?  no   Family History  Problem Relation Age of Onset  . Hypertension Mother   . Stroke Father   . Hypertension Father   . Diabetes Father    Social History   Social History  . Marital status: Single    Spouse name: N/A  . Number of children: 3  . Years of education: 12   Occupational History  . disabled    Social History Main Topics  . Smoking status: Never Smoker  . Smokeless tobacco: Never Used  . Alcohol use No  . Drug use: No  . Sexual activity: Not on file   Other Topics Concern  . Not on file   Social History Narrative   Patient is single with 3  children.   Patient is right handed.   Patient has hs education.   Patient does not drink caffeine.   Past Surgical History:  Procedure Laterality Date  . CESAREAN SECTION  ~ 22  . TEE WITHOUT CARDIOVERSION N/A 12/08/2013   Procedure: TRANSESOPHAGEAL ECHOCARDIOGRAM (TEE);  Surgeon: Candee Furbish, MD;  Location: [facility] ENDOSCOPY;  Service: Cardiovascular;  Laterality: N/A;  . TUBAL LIGATION     Past Medical History:  Diagnosis Date  . Aphasia due to stroke ([place])   . Bipolar affective disorder ([place])   . Depression   . Diabetes mellitus ([place])    ?type (05/29/2013)  . Episodic confusion   . 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 Hypertension   . Schizophrenia ([place])    [name] 11/28/2007 (05/29/2013)  . Stroke ([place]) 05/27/2013   BP (!) 159/97   Pulse 78   SpO2 98%   Opioid Risk Score:   Fall Risk Score:  `1  Depression screen PHQ 2/9  Depression screen [facility] 2/9 06/18/2015 04/07/2015 03/04/2015 12/11/2014 12/11/2014 10/20/2014 06/04/2014  Decreased Interest 0 2 0 1 3 (No Data) 1  Down, Depressed, Hopeless 0 2 0 2 3 (No Data) 1  PHQ - 2 Score 0 4 0 3 6 - 2  Altered sleeping - 2 - 2 - - 1  Tired, decreased energy -  2 - 1 - - 2  Change in appetite - 2 - 1 - - 2  Feeling bad or failure about yourself  - 1 - 0 - - 0  Trouble concentrating - 1 - 1 - - 2  Moving slowly or fidgety/restless - 0 - 1 - - 0  Suicidal thoughts - 0 - 0 - - 0  PHQ-9 Score - 12 - 9 - - 9  Difficult doing work/chores - - - Somewhat difficult - - -  Some recent data might be hidden    Review of Systems  Cardiovascular:       Limb swelling  Gastrointestinal: Positive for constipation.  All other systems reviewed and are negative.      Objective:   Physical Exam  Constitutional: She is oriented to person, place, and time. She appears well-developed and well-nourished.  HENT:  Head: Normocephalic and atraumatic.  Eyes: Conjunctivae and EOM are normal. Pupils are equal, round, and  reactive to light.  Neck: Normal range of motion.  Neurological: She is alert and oriented to person, place, and time. Gait abnormal.  Motor strength is 0/5 in the right deltoid, biceps, triceps, grip, 3 minus at the right knee extensor, 2 at the ankle plantar flexor trace, ankle dorsiflexor, patient does have apraxia, which hampers manual muscle testing.  Psychiatric: She has a normal mood and affect.  Nursing note and vitals reviewed.  Ashworth grade 3 spasticity at the right elbow flexor, wrist flexors and finger flexors including the thumb flexor. Patient has some moderate pain with passive extension of the finger flexes in the wrist flexor and the elbow flexors. She does have decreased hygiene at the elbow crease as well as the finger creases. She remains severely aphasic. This is mainly expressive. Sit to stand is with supervision assistance. She is fearful of falling. She would not take any steps for me even with hand-held support.       Assessment & Plan:  1. Right spastic hemiplegia secondary left MCA infarct with residual aphasia. We discussed that there is no medication or injection that would help restore her motor function on the right side. We discussed that the Botox was basically just to relax the upper extremity spasms so that she can have more comfort during hygiene and dressing.  At this point, patient is not sure whether she wants a repeat injection. Daughter seems to be in favor. They will call me to schedule on when necessary basis. No follow-up appointment made.

## 2015-12-31 ENCOUNTER — Other Ambulatory Visit: Payer: Self-pay | Admitting: Internal Medicine

## 2016-01-06 ENCOUNTER — Telehealth: Payer: Self-pay | Admitting: Internal Medicine

## 2016-01-06 ENCOUNTER — Ambulatory Visit (INDEPENDENT_AMBULATORY_CARE_PROVIDER_SITE_OTHER): Payer: Medicaid Other | Admitting: Neurology

## 2016-01-06 ENCOUNTER — Encounter: Payer: Self-pay | Admitting: Neurology

## 2016-01-06 VITALS — BP 121/83 | HR 68 | Ht 65.0 in | Wt 131.0 lb

## 2016-01-06 DIAGNOSIS — I10 Essential (primary) hypertension: Secondary | ICD-10-CM | POA: Diagnosis not present

## 2016-01-06 DIAGNOSIS — I63512 Cerebral infarction due to unspecified occlusion or stenosis of left middle cerebral artery: Secondary | ICD-10-CM

## 2016-01-06 DIAGNOSIS — G8110 Spastic hemiplegia affecting unspecified side: Secondary | ICD-10-CM | POA: Diagnosis not present

## 2016-01-06 DIAGNOSIS — E1159 Type 2 diabetes mellitus with other circulatory complications: Secondary | ICD-10-CM | POA: Diagnosis not present

## 2016-01-06 DIAGNOSIS — R4701 Aphasia: Secondary | ICD-10-CM

## 2016-01-06 DIAGNOSIS — Z794 Long term (current) use of insulin: Secondary | ICD-10-CM | POA: Diagnosis not present

## 2016-01-06 DIAGNOSIS — R569 Unspecified convulsions: Secondary | ICD-10-CM

## 2016-01-06 MED ORDER — METOPROLOL TARTRATE 100 MG PO TABS: 100.0000 mg | ORAL_TABLET | Freq: Three times a day (TID) | ORAL | 0 refills | Status: DC

## 2016-01-06 MED ORDER — SIMVASTATIN 20 MG PO TABS: ORAL_TABLET | ORAL | 0 refills | Status: DC

## 2016-01-06 MED ORDER — HYDROCHLOROTHIAZIDE 25 MG PO TABS: ORAL_TABLET | ORAL | 0 refills | Status: DC

## 2016-01-06 MED ORDER — GABAPENTIN 100 MG PO CAPS: 100.0000 mg | ORAL_CAPSULE | Freq: Three times a day (TID) | ORAL | 0 refills | Status: DC

## 2016-01-06 MED ORDER — AMLODIPINE BESYLATE 10 MG PO TABS: 10.0000 mg | ORAL_TABLET | Freq: Every day | ORAL | 0 refills | Status: DC

## 2016-01-06 NOTE — Telephone Encounter (Signed)
Pts daughter came in requesting a medication refill on the following medications:   simvastatin (ZOCOR) 20 MG tablet  metoprolol (LOPRESSOR) 100 MG tablet  hydrochlorothiazide (HYDRODIURIL) 25 MG tablet gabapentin (NEURONTIN) 100 MG capsule  amLODipine (NORVASC) 10 MG tablet   Pts daughter understands that pt must have OV for refills, however there is no availability. Pts daughter was instructed to call Monday to schedule in November

## 2016-01-06 NOTE — Patient Instructions (Signed)
-   continue ASA and zocor for stroke prevention  - agree with psychiatry / psychology referral for depression manageent - maintain seizure precautions. - will repeat EEG to further evaluation - follow up with Dr. Read Drivers for rehab - Follow up with primary care physician for stroke risk factor modification. Recommend maintain blood pressure goal <130/80, diabetes with hemoglobin A1c goal below 6.5% and lipids with LDL cholesterol goal below 70 mg/dL.  - check BP and glucose at home and record - follow up in 6 months.

## 2016-01-06 NOTE — Progress Notes (Signed)
PATIENT: [name] DOB: 04-19-1960  REASON FOR VISIT: Hospital follow up for stroke HISTORY FROM: daughter and patient  HISTORY OF PRESENT ILLNESS: 55 y.o. [name] female who comes to the office for first hospital follow up post hospital discharge for stroke. She has a history of HTN, DM, bipolar disorder, who was admitted on 05/27/13 with right facial droop and aphasia. 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 Infarcts felt to be embolic due to unknown source and she was placed on ASA for secondary stroke prevention.  Patient had progressive neurologic worsening on 05/29/13 pm with decrease in LOC and right sided weakness. EEG negative for seizures and CCT without significant changes; felt patient with progression of symptoms due to relative hypotension and recommended allowing significantly high permissive hypertension in this patient. Hgb A1c was 14.1 in hospital, now 5.9.  LDL was 130. Hypercoagulable workup negative. 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 No other intracranial flow-limiting stenosis or embolic disease evident. Areas of low density in the left MCA territory consistent with infarction. 2D Echocardiogram with EF of 55-60% with no source of embolus. TEE was not done due to hypotension in hospital. Patient with resultant right hemiparesis and aphasia.  Has had PEG tube removed by radiology in June. Eating well and drinking well. Remains aphasic. Contemplating Botox injections to the wrist and finger flexors, hamstrings, and gastrocnemius by Dr.  Letta Pate. Using Baclofen for spasticity and gabapentin for pain. 103 year old daughter is providing all care.   01/05/14 follow up - she still takes full dose ASA and zocor for stroke prevention. Follows with PCP for BP and DM. RLE muscle strength improved but RUE still flaccid. Remains aphasic with only "Thank you" and "yes/no". She had inpt rehab followed by home PT/OT but then discharged from home PT/OT but no outpt PT/OT initiated yet. Daughter is going to check the status of outpt PT/OT.  04/02/14 follow up - patient has been doing the same. Still has significant aphasia and RUE weakness, but she was able to walk with semi-walker now. She follows with Dr. Read Drivers and will start PT and botox injection again. Her loop recorder never put in, will have to refer her again. She went to ER on 03/17/14 for episode of holding head, do not respond on commands, keep sticking in and out of her tougue for about 5 min, no shaking jerking or LOC. Daughter concerned and pt sent to ER, repeat CT no acute changes and pt back to baseline.   07/09/14 follow up - patient has been doing the same. She was referred to Dr. Rayann Heman for loop recorder placement, however she refused again. Blood pressure 122/82 in clinic today, she still has significant aphasia and right sided weakness. As per daughter, patient recently started to have staring spells with drooling at the right mouth, no shaking jerking, tongue biting, b/b incontinence, lasting several minutes.  02/02/15 follow up - pt has been doing the same. Had EEG in office which was negative for seizure. I offered home EEG long term monitoring but not done yet and I think daughter declined that test.  Daughter also declined seizure medication at that time. However, as per daughter, pt has no more episodes like before. She continued follow up with Dr. Read Drivers and will have botox for spasticity. She still has PT/OT to work with her.   09/29/15 follow up - she had ER visit on  09/25/15 due to seizure. She went to bathroom, family member heard patient fall, and found her face down with generalized shaking, 'foaming' at mouth' - symptoms persisted 1-2 minutes, patient was unresponsive during episode, and confused for several minutes after. On arrival to ED, patients mental status and function described by family as consistent w baseline. She was given keppra 500mg  IV. She was d/c from ER with prescription of keppra.  Daughter has not filled the medication yet. However, stated that pt was drowsy sleepy since the seizure but easily arousable. At baseline, she has intermittent confusion and agitation. BP recently high, on amlodipine, HCTZ and metoprolol. Today 136/89. Stated that glucose controlled well.   Interval history During the interval time, patient has been doing the same. Still has aphasia and right-sided weakness. Still wheelchair-bound. No seizure episode. However, as per daughter, patient developed behavioral changes, intermittent agitation, not revealing to come to doctor appointments, now waiting to taking care of herself, less active and seems depressed. Patient is going to have psychiatry referral soon. Patient also refused Vimpat for seizure control. So far patient was not on any seizure medications. BP 121/83.  REVIEW OF SYSTEMS: Full 14 system review of systems performed and notable only for: eye itching, ringing in ears, runny nose, light sensitivity, eye pain, leg swelling, cold intolerance, excessive thirst, constipation, frequent waking, joint pain, aching muscles, walking difficulty, speech difficulty, weakness, agitation and confusion.  ALLERGIES: No Known Allergies  HOME MEDICATIONS: Outpatient Medications Prior to Visit  Medication Sig Dispense Refill  . ACCU-CHEK AVIVA PLUS test strip USE 1 STRIP TO TEST BEFORE MEALS AND AT BEDTIME 100 each 2  . ACCU-CHEK SOFTCLIX LANCETS lancets TEST BEFORE MEALS AND AT BEDTIME 100 each 2  . acetaminophen (TYLENOL)  325 MG tablet Take 1-2 tablets (325-650 mg total) by mouth every 4 (four) hours as needed for mild pain (available over the counter).    Marland Kitchen albuterol (PROVENTIL HFA;VENTOLIN HFA) 108 (90 Base) MCG/ACT inhaler Inhale 2 puffs into the lungs every 4 (four) hours as needed for wheezing or shortness of breath. 1 Inhaler 0  . amLODipine (NORVASC) 10 MG tablet Take 1 tablet (10 mg total) by mouth daily. 30 tablet 0  . amoxicillin-clavulanate (AUGMENTIN) 875-125 MG tablet Take 1 tablet by mouth 2 (two) times daily. 14 tablet 0  . aspirin 325 MG tablet Take 325 mg by mouth daily. Reported on 09/25/2015    . baclofen (LIORESAL) 10 MG tablet TAKE 1 TABLET BY MOUTH TWO TIMES DAILY TO HELP WITH TONE/SPASMS 60 tablet 2  . feeding supplement, ENSURE ENLIVE, (ENSURE ENLIVE) LIQD Take 237 mLs by mouth 2 (two) times daily between meals.    . fluticasone (FLONASE) 50 MCG/ACT nasal spray Place 2 sprays into both nostrils daily. (Patient taking differently: Place 2 sprays into both nostrils daily as needed for allergies. ) 16 g 6  . gabapentin (NEURONTIN) 100 MG capsule Take 1 capsule (100 mg total) by mouth 3 (three) times daily. 90 capsule 0  . hydrochlorothiazide (HYDRODIURIL) 25 MG tablet TAKE ONE (1) TABLET BY MOUTH EVERY DAY 30 tablet 0  . Insulin Glargine (LANTUS SOLOSTAR) 100 UNIT/ML Solostar Pen Inject 10 Units into the skin daily at  10 pm. Once DAILY (Patient taking differently: Inject 10 Units into the skin daily as needed (for blood sugar). ) 5 pen 11  . metoprolol (LOPRESSOR) 100 MG tablet Take 1 tablet (100 mg total) by mouth 3 (three) times daily. 90 tablet 0  . polyethylene glycol powder (GLYCOLAX/MIRALAX) powder Take 17 g by mouth daily as needed (constipation). 527 g 3  . protein supplement shake (PREMIER PROTEIN) LIQD Take 2 oz by mouth daily.    . SENNA LAX 8.6 MG tablet TAKE 1 TABLET BY MOUTH EVERY DAY AS NEEDED FOR CONSTIPATION 30 tablet 2  . simvastatin (ZOCOR) 20 MG tablet TAKE 1 TABLET BY MOUTH IN  THE EVENING AT6 PM 30 tablet 0  . Spacer/Aero-Holding Chambers (E-Z SPACER) inhaler Use as instructed 1 each 2  . UNIFINE PENTIPS 31G X 6 MM MISC USE AS PRESCRIBED BY PHYSICIAN 100 each 11  . Lacosamide 100 MG TABS Take 1 tablet (100 mg total) by mouth 2 (two) times daily. 60 tablet 3  . levETIRAcetam (KEPPRA) 500 MG tablet Take 1 tablet (500 mg total) by mouth 2 (two) times daily. (Patient not taking: Reported on 01/06/2016) 60 tablet 0   No facility-administered medications prior to visit.     PHYSICAL EXAM Vitals:   01/06/16 1315  BP: 121/83  BP Location: Left Arm  Patient Position: Sitting  Cuff Size: Small  Pulse: 68  Weight: 131 lb (59.4 kg)  Height: 5\' 5"  (1.651 m)   Generalized: Well developed, in no acute distress  Head: normocephalic and atraumatic. Oropharynx benign  Neck: Supple, no carotid bruits  Cardiac: Regular rate rhythm, no murmur   Neurological examination  Mentation: Orientation unable to assess due to aphasia.  Severe expressive aphasia with only "Thank you" and no sentences. Able to comprehend 1-2 simple central commands, but not peripheral commands. Unable to name or repeat.  Cranial nerve II-XII: Fundoscopic exam not able to see through. Pupils were equal round reactive to light, extraocular movements were full.  Facial droop on the right, normal on the left. Hearing was intact to finger rubbing bilaterally. Tongue in middle. Head turning and shoulder shrug are asymmetric, impaired on right. Motor: Right upper extremity distal strength 0/5, proximal 2/5, right lower extremity strength is 4-/5 proxima and 3-/5 distal. Left upper and lower extremity strength 5/5. Increased tone on the right Sensory: Sensory testing is intact to soft touch LUE and LLE. Evidence of extinction is noted on the right. Coordination: Cerebellar testing not able to do on the right. Gait and station: Gait is not tested, in wheelchair Reflexes: Deep tendon reflexes are symmetric and  decreased bilaterally.   I have personally reviewed the radiological images below and agree with the radiology interpretations.  EEG 05/2013 - This is an abnormal EEG secondary to slowing over the left hmeisphere with frequent left hemispheric sharp acitivty as well.. This finding is suggestive of a focal disturbance that is etiologically nonspecific, but may include a mass lesion or post-ictal focal slowing among other possibilities.   CT of the brain 05/27/2013 No acute intracranial pathology.   CT brain 03/17/14 - No acute abnormality. 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  CTA head 05/2013- Severe stenosis of the left middle cerebral artery at the M1 segment with markedly diminished visualization of the more distal left MCA branches. No other intracranial flow-limiting stenosis or embolic disease evident. Areas of low density in the left MCA territory consistent with infarction. No hemorrhagic transformation. Question new low density in the pons,  not definite.  MRI of the brain 05/27/2013 Multiple foci of acute ischemia within the left middle cerebral artery territory. In addition, however punctate focus of acute ischemia within the left occipital lobe and right thalamus (both of which would be posterior circulation). All areas of ischemia have similar acuity. No hemorrhagic conversion. Symmetric abnormal bright signal within the lenticulostriate nuclei (basal ganglia) in addition to abnormal temporoccipital periventricular T2 hyperintense signal. These findings may reflect sequelae of acute severe hypertension (PRES) but, it can also be seen with osmotic demyelination, autoimmune disorders such as scleroderma (which can have associated vasculopathy), less likely infectious encephalitis. Moderate to severe white matter changes, in a distribution which can be seen with demyelination, with superimposed possible chronic small vessel ischemic disease though, are nonspecific.   MRI  brain 12/05/13 - No evidence of acute ischemia, specifically no acute LEFT sided stroke. Remote large LEFT middle cerebral artery territory hemorrhagic infarct. Similar abnormal signal within the basal ganglia and pons could reflect metabolic disorder, sequelae of possible PRES, less likely pontine/extra pontine myelinolysis, findings are similar. Remote bilateral thalamus lacunar infarcts. Moderate white matter changes suggest chronic small vessel ischemic disease.  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 Mild luminal irregularity of the intracranial vessels which can be seen with intracranial atherosclerosis though, may also be seen with vasculopathy.   2D Echocardiogram EF 55-60% with no source of embolus.   Carotid Doppler No evidence of hemodynamically significant internal carotid artery stenosis. Vertebral artery flow is antegrade.   TCD emboli detection -  Negative  EEG 07/13/14 - This is a mildly abnormal EEG due to the presence of left hemispheric slowing and focal left frontotemporal irritability which may be compatible with patient's history of large left hemispheric infarct. No definite epileptiform activity is noted. Overall no significant change compared with EEG dated 05/31/14.  Component     Latest Ref Rng 05/28/2013 05/30/2013 08/22/2013 12/05/2013  PTT Lupus Anticoagulant     28.0 - 43.0 secs 37.9     PTTLA Confirmation     <8.0 secs NOT APPL     PTTLA 4:1 Mix     28.0 - 43.0 secs NOT APPL     DRVVT     <42.9 secs 35.1     Drvvt confirmation     <1.11 Ratio NOT APPL     dRVVT Incubated 1:1 Mix     <42.9 secs NOT APPL     Lupus Anticoagulant     NOT DETECTED NOT DETECTED     Cholesterol     0 - 200 mg/dL 201 (H)     Triglycerides     <150 mg/dL 145     HDL     >39 mg/dL 42     Total CHOL/HDL Ratio      4.8     VLDL     0 - 40 mg/dL 29     LDL (calc)     0 - 99 mg/dL 130 (H)     Anticardiolipin IgG     <23 GPL U/mL 5  (L)     Anticardiolipin IgM     <11 MPL U/mL 2 (L)     Anticardiolipin IgA     <22 APL U/mL 10 (L)     Hgb A1c MFr Bld      14.6 (H) 14.1 (H) 6.8   Mean Plasma Glucose     <117 mg/dL 372 (H) 358 (H)    C3 Complement  90 - 180 mg/dL 115     Complement C4, Body Fluid     10 - 40 mg/dL 31     Compl, Total (CH50)     31 - 60 U/mL >60 (H)     ANA Ser Ql     NEGATIVE NEGATIVE     RPR     NON REACTIVE NON REACTIVE     Sed Rate     0 - 22 mm/hr 25 (H)     AntiThromb III Func     75 - 120 % 125 (H)     Protein C Activity     75 - 133 % 200 (H)     Protein C, Total     72 - 160 % 165 (H)     Protein S Activity     69 - 129 % 120     Protein S Ag, Total     60 - 150 % 133     Homocysteine     4.0 - 15.4 umol/L 8.0     Recommendations-PTGENE:      (NOTE)     HIV     NON REACTIVE NON REACTIVE     TSH     0.350 - 4.500 uIU/mL    1.690    ASSESSMENT: 55 y.o. year old AA female  has a past medical history of Hypertension; Depression, Diabetes mellitus; Bipolar affective disorder, Schizophrenia followed up in clinic for Left MCA distribution infarct from 05/27/13. 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 MRA showed left MCA occlusion. Infarcts felt to be embolic secondary to unknown source, although not able to rule out large vessel asthero due to severe HTN and uncontrolled DM.  Stroke workup negative including carotid Doppler, TCD MES, hypercoagulable workup. Patient refused loop recorder. Residual right hemiparesis and severe aphasia. Needs assistance with all ADL's. Currently BP and glucose in good control. She had a EEG in March 2015 which was negative for seizure but left hemisphere slowing but with frequent sharp activity that has some spread to the right frontal regions at times. Pt had staring spells with drooling early 2016 and repeat EEG  negative for seizure but left hemisphere slowing and focal left frontotemporal frequent sharp activity.  Long term EEG and seizure medication declined by daughter in the past. Pt had GTC seizure 09/25/15 and was given one dose keppra. Due to baseline sleepiness and intermittent confusion agitation, prescribed vimpat for seizure control. However, patient developed symptoms and signs of depression, pending psychiatry referral. Refused seizure medications and continued PMR follow-up.  PLAN: - continue ASA and zocor for stroke prevention  - agree with psychiatry / psychology referral for depression manageent - maintain seizure precautions. - will repeat EEG to further evaluation - Follow up with primary care physician for stroke risk factor modification. Recommend maintain blood pressure goal <130/80, diabetes with hemoglobin A1c goal below 6.5% and lipids with LDL cholesterol goal below 70 mg/dL.  - check BP and glucose at home and record - follow up in 6 months.  I spent more than 25 minutes of face to face time with the patient. Greater than 50% of time was spent in counseling and coordination of care. We have discussed about psychiatry referral, repeat EEG, seizure medication, and seizure precautions.   Orders Placed This Encounter  Procedures  . EEG adult  Standing Status:   Future    Standing Expiration Date:   01/05/2017     No orders of the defined types were placed in this encounter.   Patient Instructions  - continue ASA and zocor for stroke prevention  - agree with psychiatry / psychology referral for depression manageent - maintain seizure precautions. - will repeat EEG to further evaluation - follow up with Dr. Read Drivers for rehab - Follow up with primary care physician for stroke risk factor modification. Recommend maintain blood pressure goal <130/80, diabetes with hemoglobin A1c goal below 6.5% and lipids with LDL cholesterol goal below 70 mg/dL.  - check BP and glucose  at home and record - follow up in 6 months.  Rosalin Hawking, MD PhD [facility] [address], [place], [place] [address] [phone]

## 2016-01-06 NOTE — Telephone Encounter (Signed)
Requested medications refilled 

## 2016-01-20 ENCOUNTER — Encounter: Payer: Self-pay | Admitting: Family Medicine

## 2016-01-20 ENCOUNTER — Ambulatory Visit: Payer: Medicaid Other | Attending: Family Medicine | Admitting: Family Medicine

## 2016-01-20 VITALS — BP 136/88 | HR 72 | Temp 98.3°F | Ht 65.0 in | Wt 139.8 lb

## 2016-01-20 DIAGNOSIS — F329 Major depressive disorder, single episode, unspecified: Secondary | ICD-10-CM | POA: Insufficient documentation

## 2016-01-20 DIAGNOSIS — R569 Unspecified convulsions: Secondary | ICD-10-CM

## 2016-01-20 DIAGNOSIS — E119 Type 2 diabetes mellitus without complications: Secondary | ICD-10-CM

## 2016-01-20 DIAGNOSIS — R4701 Aphasia: Secondary | ICD-10-CM

## 2016-01-20 DIAGNOSIS — Z794 Long term (current) use of insulin: Secondary | ICD-10-CM | POA: Diagnosis not present

## 2016-01-20 DIAGNOSIS — E0800 Diabetes mellitus due to underlying condition with hyperosmolarity without nonketotic hyperglycemic-hyperosmolar coma (NKHHC): Secondary | ICD-10-CM

## 2016-01-20 DIAGNOSIS — F209 Schizophrenia, unspecified: Secondary | ICD-10-CM | POA: Diagnosis not present

## 2016-01-20 DIAGNOSIS — G8110 Spastic hemiplegia affecting unspecified side: Secondary | ICD-10-CM

## 2016-01-20 DIAGNOSIS — Z9851 Tubal ligation status: Secondary | ICD-10-CM | POA: Diagnosis not present

## 2016-01-20 DIAGNOSIS — I10 Essential (primary) hypertension: Secondary | ICD-10-CM | POA: Insufficient documentation

## 2016-01-20 DIAGNOSIS — Z8673 Personal history of transient ischemic attack (TIA), and cerebral infarction without residual deficits: Secondary | ICD-10-CM | POA: Diagnosis not present

## 2016-01-20 DIAGNOSIS — Z7982 Long term (current) use of aspirin: Secondary | ICD-10-CM | POA: Diagnosis not present

## 2016-01-20 DIAGNOSIS — F319 Bipolar disorder, unspecified: Secondary | ICD-10-CM | POA: Insufficient documentation

## 2016-01-20 LAB — POCT GLYCOSYLATED HEMOGLOBIN (HGB A1C): Hemoglobin A1C: 6.1

## 2016-01-20 LAB — GLUCOSE, POCT (MANUAL RESULT ENTRY): POC Glucose: 174 mg/dl — AB (ref 70–99)

## 2016-01-20 MED ORDER — METOPROLOL TARTRATE 100 MG PO TABS: 150.0000 mg | ORAL_TABLET | Freq: Two times a day (BID) | ORAL | 5 refills | Status: DC

## 2016-01-20 MED ORDER — AMLODIPINE BESYLATE 10 MG PO TABS: 10.0000 mg | ORAL_TABLET | Freq: Every day | ORAL | 5 refills | Status: DC

## 2016-01-20 MED ORDER — INSULIN GLARGINE 100 UNIT/ML SOLOSTAR PEN: 10.0000 [IU] | PEN_INJECTOR | Freq: Every day | SUBCUTANEOUS | 11 refills | Status: DC

## 2016-01-20 MED ORDER — BACLOFEN 10 MG PO TABS: ORAL_TABLET | ORAL | 2 refills | Status: DC

## 2016-01-20 MED ORDER — GABAPENTIN 100 MG PO CAPS: 100.0000 mg | ORAL_CAPSULE | Freq: Three times a day (TID) | ORAL | 5 refills | Status: DC

## 2016-01-20 MED ORDER — ATORVASTATIN CALCIUM 20 MG PO TABS: 20.0000 mg | ORAL_TABLET | Freq: Every day | ORAL | 5 refills | Status: DC

## 2016-01-20 MED ORDER — HYDROCHLOROTHIAZIDE 25 MG PO TABS: ORAL_TABLET | ORAL | 5 refills | Status: DC

## 2016-01-20 NOTE — Progress Notes (Signed)
Subjective:    Patient ID: [name], female    DOB: February 13, 1961, 54 y.o.   MRN: [id number] Current Issue Re-establishment of Care HPI  [name] is a 55 year old female with a past medical history of stroke, depression, hypertension and diabetes. Patient has not seen her Primary since march and she needs her medications refilled. Stroke Patient suffered a stroke in march 2015 and currently has a right sided weakness and aphasia. Patient had a speech and physical therapy session this year but that has ended. Depression. Patient's daughter thinks patient is depressed due to patients inability to do things she used to do and enjoy life as it was before the stroke. Hypertension Patient is currently on Amlodipine, hydrochlorothiazide and metoprolol for blood pressure and it is currently managed. Diabetes Mellitus Patient is on 10 units of Lantus daily but her daughter indicated that, she checks patients blood sugar twice daily and only administers the lantus when it is high. Hgb A1C is 6.1 today   Past Medical History:  Diagnosis Date  . Aphasia due to stroke ([place])   . Bipolar affective disorder ([place])   . Depression   . Diabetes mellitus ([place])    ?type (05/29/2013)  . Episodic confusion   . 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 Hypertension   . Schizophrenia ([place])    [name] 11/28/2007 (05/29/2013)  . Stroke [facility]) 05/27/2013   Past Surgical History:  Procedure Laterality Date  . CESAREAN SECTION  ~ 74  . TEE WITHOUT CARDIOVERSION N/A 12/08/2013   Procedure: TRANSESOPHAGEAL ECHOCARDIOGRAM (TEE);  Surgeon: Candee Furbish, MD;  Location: [facility] ENDOSCOPY;  Service: Cardiovascular;  Laterality: N/A;  . TUBAL LIGATION     Current Outpatient Prescriptions on File Prior to Visit  Medication Sig Dispense Refill  . ACCU-CHEK AVIVA PLUS test strip USE 1 STRIP TO TEST BEFORE MEALS AND AT BEDTIME 100 each 2  . ACCU-CHEK SOFTCLIX LANCETS lancets TEST BEFORE MEALS  AND AT BEDTIME 100 each 2  . acetaminophen (TYLENOL) 325 MG tablet Take 1-2 tablets (325-650 mg total) by mouth every 4 (four) hours as needed for mild pain (available over the counter).    Marland Kitchen aspirin 325 MG tablet Take 325 mg by mouth daily. Reported on 09/25/2015    . feeding supplement, ENSURE ENLIVE, (ENSURE ENLIVE) LIQD Take 237 mLs by mouth 2 (two) times daily between meals.    . polyethylene glycol powder (GLYCOLAX/MIRALAX) powder Take 17 g by mouth daily as needed (constipation). 527 g 3  . protein supplement shake (PREMIER PROTEIN) LIQD Take 2 oz by mouth daily.    Marland Kitchen UNIFINE PENTIPS 31G X 6 MM MISC USE AS PRESCRIBED BY PHYSICIAN 100 each 11  . SENNA LAX 8.6 MG tablet TAKE 1 TABLET BY MOUTH EVERY DAY AS NEEDED FOR CONSTIPATION (Patient not taking: Reported on 01/20/2016) 30 tablet 2  . Spacer/Aero-Holding Chambers (E-Z SPACER) inhaler Use as instructed (Patient not taking: Reported on 01/20/2016) 1 each 2   No current facility-administered medications on file prior to visit.       Review of Systems  Constitutional: Negative for activity change, appetite change, chills and fatigue.  HENT: Positive for dental problem. Negative for congestion, drooling, ear discharge, ear pain and facial swelling.   Eyes: Negative for photophobia, pain, discharge and itching.  Respiratory: Negative for cough, chest tightness, shortness of breath, wheezing and stridor.   Cardiovascular: Negative for chest pain, palpitations and leg swelling.  Gastrointestinal: Negative for  abdominal distention, abdominal pain, diarrhea and nausea.  Endocrine: Negative for cold intolerance, heat intolerance and polydipsia.  Genitourinary: Negative for difficulty urinating, dyspareunia, frequency and hematuria.  Musculoskeletal: Positive for myalgias.  Neurological: Negative for dizziness, seizures, light-headedness, numbness and headaches.  Hematological: Negative for adenopathy. Does not bruise/bleed easily.    Psychiatric/Behavioral: Negative for behavioral problems, dysphoric mood and suicidal ideas.       Objective: Vitals:   01/20/16 1143  BP: 136/88  Pulse: 72  Temp: 98.3 F (36.8 C)       Physical Exam  Constitutional: She appears well-developed and well-nourished.  HENT:  Head: Normocephalic and atraumatic.  Right Ear: External ear normal.  Left Ear: External ear normal.  Eyes: EOM are normal. Pupils are equal, round, and reactive to light.  Cardiovascular: Normal rate and regular rhythm.   Pulmonary/Chest: Effort normal and breath sounds normal. No respiratory distress. She has no wheezes. She exhibits no tenderness.  Abdominal: Bowel sounds are normal. She exhibits no distension and no mass. There is no tenderness. There is no guarding.  Skin: Skin is warm and dry.  Psychiatric: She has a normal mood and affect.      Assessment & Plan:  Stroke Patient will continue on aspirin 325 mg daily and follow up on already scheduled neurological appointment.  Hypertension Patients metoprolol will be changed to bid at this time. She will continue on metoprolol, amlodipine and Hydrochlorothiazide for blood pressure management.  Depression Patient refuse medical management of depression at this time but she was encourage to let PCP know if she changes her mind and wants her symptoms to be managed with medication. Patient has an appointment with [facility] for post stroke depression evaluation this next Friday.  Diabetes Mellitus Lantus reduced to 5 units and if blood sugars are hypoglycemic will discontinue Lantus.   Health management Lipid Panel TSH CBC The above labs will be done in a week and will continue from there. Medications will be refilled.  Follow up Follow up in a month for diabetes management

## 2016-01-21 ENCOUNTER — Encounter: Payer: Self-pay | Admitting: Family Medicine

## 2016-01-25 ENCOUNTER — Ambulatory Visit: Payer: Medicaid Other

## 2016-01-27 ENCOUNTER — Ambulatory Visit: Payer: Medicaid Other | Attending: Family Medicine

## 2016-01-27 DIAGNOSIS — Z794 Long term (current) use of insulin: Secondary | ICD-10-CM | POA: Insufficient documentation

## 2016-01-27 DIAGNOSIS — E119 Type 2 diabetes mellitus without complications: Secondary | ICD-10-CM | POA: Diagnosis present

## 2016-01-27 LAB — COMPLETE METABOLIC PANEL WITH GFR
ALT: 21 U/L (ref 6–29)
AST: 23 U/L (ref 10–35)
Albumin: 4.4 g/dL (ref 3.6–5.1)
Alkaline Phosphatase: 75 U/L (ref 33–130)
BUN: 18 mg/dL (ref 7–25)
CALCIUM: 9.8 mg/dL (ref 8.6–10.4)
CHLORIDE: 102 mmol/L (ref 98–110)
CO2: 30 mmol/L (ref 20–31)
Creat: 1 mg/dL (ref 0.50–1.05)
GFR, EST AFRICAN AMERICAN: 74 mL/min (ref >=60)
GFR, Est Non African American: 64 mL/min (ref >=60)
Glucose, Bld: 94 mg/dL (ref 65–99)
POTASSIUM: 4.2 mmol/L (ref 3.5–5.3)
Sodium: 141 mmol/L (ref 135–146)
Total Bilirubin: 0.4 mg/dL (ref 0.2–1.2)
Total Protein: 8.1 g/dL (ref 6.1–8.1)

## 2016-01-27 LAB — LIPID PANEL
CHOL/HDL RATIO: 1.9 ratio (ref <5.0)
CHOLESTEROL: 136 mg/dL (ref <200)
HDL: 70 mg/dL (ref >50)
LDL Cholesterol: 58 mg/dL
TRIGLYCERIDES: 41 mg/dL (ref <150)
VLDL: 8 mg/dL (ref <30)

## 2016-01-27 NOTE — Progress Notes (Signed)
Patient here for lab visit only 

## 2016-02-01 ENCOUNTER — Other Ambulatory Visit: Payer: Medicaid Other

## 2016-02-02 ENCOUNTER — Encounter: Payer: Self-pay | Admitting: Neurology

## 2016-02-02 ENCOUNTER — Ambulatory Visit: Payer: Medicaid Other | Admitting: Neurology

## 2016-02-22 ENCOUNTER — Ambulatory Visit (INDEPENDENT_AMBULATORY_CARE_PROVIDER_SITE_OTHER): Payer: Medicaid Other | Admitting: Podiatry

## 2016-02-22 ENCOUNTER — Encounter: Payer: Self-pay | Admitting: Podiatry

## 2016-02-22 VITALS — Ht 65.0 in | Wt 139.0 lb

## 2016-02-22 DIAGNOSIS — B351 Tinea unguium: Secondary | ICD-10-CM | POA: Diagnosis not present

## 2016-02-22 DIAGNOSIS — M79674 Pain in right toe(s): Secondary | ICD-10-CM | POA: Diagnosis not present

## 2016-02-22 DIAGNOSIS — M79675 Pain in left toe(s): Secondary | ICD-10-CM | POA: Diagnosis not present

## 2016-02-22 NOTE — Progress Notes (Signed)
Complaint:  Visit Type: Patient returns to my office for continued preventative foot care services. Complaint: Patient states" my nails have grown long and thick and become painful to walk and wear shoes" Patient has been diagnosed with DM with no foot complications. The patient presents for preventative foot care services. No changes to ROS  Podiatric Exam: Vascular: dorsalis pedis and posterior tibial pulses are weak  bilateral. Capillary return is immediate. Temperature gradient is WNL. Skin turgor WNL  Sensorium: Diminished  Semmes Weinstein monofilament test. Normal tactile sensation bilaterally. Nail Exam: Pt has thick disfigured discolored nails with subungual debris noted bilateral entire nail hallux through fifth toenails Ulcer Exam: There is no evidence of ulcer or pre-ulcerative changes or infection. Orthopedic Exam: Muscle tone and strength are WNL. No limitations in general ROM. No crepitus or effusions noted. Foot type and digits show no abnormalities. Bony prominences are unremarkable. Skin: No Porokeratosis. No infection or ulcers  Diagnosis:  Onychomycosis, , Pain in right toe, pain in left toes  Treatment & Plan Procedures and Treatment: Consent by patient was obtained for treatment procedures. The patient understood the discussion of treatment and procedures well. All questions were answered thoroughly reviewed. Debridement of mycotic and hypertrophic toenails, 1 through 5 bilateral and clearing of subungual debris. No ulceration, no infection noted.  Return Visit-Office Procedure: Patient instructed to return to the office for a follow up visit 10 weeks  for Dr. Cannon Kettle.    Gardiner Barefoot DPM

## 2016-03-21 ENCOUNTER — Other Ambulatory Visit: Payer: Self-pay | Admitting: Internal Medicine

## 2016-03-28 ENCOUNTER — Emergency Department (HOSPITAL_COMMUNITY): Payer: Medicaid Other

## 2016-03-28 ENCOUNTER — Emergency Department (HOSPITAL_COMMUNITY)
Admission: EM | Admit: 2016-03-28 | Discharge: 2016-03-28 | Disposition: A | Discharge status: Home / Self Care | Payer: Medicaid Other | Attending: Emergency Medicine | Admitting: Emergency Medicine

## 2016-03-28 DIAGNOSIS — N390 Urinary tract infection, site not specified: Secondary | ICD-10-CM | POA: Insufficient documentation

## 2016-03-28 DIAGNOSIS — Z7982 Long term (current) use of aspirin: Secondary | ICD-10-CM | POA: Insufficient documentation

## 2016-03-28 DIAGNOSIS — E119 Type 2 diabetes mellitus without complications: Secondary | ICD-10-CM | POA: Insufficient documentation

## 2016-03-28 DIAGNOSIS — R4182 Altered mental status, unspecified: Secondary | ICD-10-CM | POA: Diagnosis present

## 2016-03-28 DIAGNOSIS — Z794 Long term (current) use of insulin: Secondary | ICD-10-CM | POA: Insufficient documentation

## 2016-03-28 DIAGNOSIS — Z8673 Personal history of transient ischemic attack (TIA), and cerebral infarction without residual deficits: Secondary | ICD-10-CM | POA: Diagnosis not present

## 2016-03-28 DIAGNOSIS — R569 Unspecified convulsions: Secondary | ICD-10-CM | POA: Insufficient documentation

## 2016-03-28 DIAGNOSIS — I10 Essential (primary) hypertension: Secondary | ICD-10-CM | POA: Diagnosis not present

## 2016-03-28 LAB — CBC
HEMATOCRIT: 40.8 % (ref 36.0–46.0)
HEMOGLOBIN: 13.5 g/dL (ref 12.0–15.0)
MCH: 30.3 pg (ref 26.0–34.0)
MCHC: 33.1 g/dL (ref 30.0–36.0)
MCV: 91.5 fL (ref 78.0–100.0)
Platelets: 233 10*3/uL (ref 150–400)
RBC: 4.46 MIL/uL (ref 3.87–5.11)
RDW: 12.1 % (ref 11.5–15.5)
WBC: 9.3 10*3/uL (ref 4.0–10.5)

## 2016-03-28 LAB — URINALYSIS, ROUTINE W REFLEX MICROSCOPIC
BILIRUBIN URINE: NEGATIVE
Glucose, UA: NEGATIVE mg/dL
Ketones, ur: NEGATIVE mg/dL
Nitrite: NEGATIVE
Protein, ur: 30 mg/dL — AB
SPECIFIC GRAVITY, URINE: 1.025 (ref 1.005–1.030)
pH: 5.5 (ref 5.0–8.0)

## 2016-03-28 LAB — COMPREHENSIVE METABOLIC PANEL
ALBUMIN: 3.9 g/dL (ref 3.5–5.0)
ALK PHOS: 76 U/L (ref 38–126)
ALT: 19 U/L (ref 14–54)
AST: 22 U/L (ref 15–41)
Anion gap: 11 (ref 5–15)
BILIRUBIN TOTAL: 0.6 mg/dL (ref 0.3–1.2)
BUN: 19 mg/dL (ref 6–20)
CALCIUM: 9.4 mg/dL (ref 8.9–10.3)
CO2: 26 mmol/L (ref 22–32)
Chloride: 103 mmol/L (ref 101–111)
Creatinine, Ser: 1.03 mg/dL — ABNORMAL HIGH (ref 0.44–1.00)
GFR calc Af Amer: >60 mL/min (ref >60)
GFR calc non Af Amer: >60 mL/min (ref >60)
GLUCOSE: 153 mg/dL — AB (ref 65–99)
POTASSIUM: 3.5 mmol/L (ref 3.5–5.1)
SODIUM: 140 mmol/L (ref 135–145)
TOTAL PROTEIN: 8.1 g/dL (ref 6.5–8.1)

## 2016-03-28 LAB — URINALYSIS, MICROSCOPIC (REFLEX)

## 2016-03-28 LAB — CBG MONITORING, ED: Glucose-Capillary: 150 mg/dL — ABNORMAL HIGH (ref 65–99)

## 2016-03-28 MED ORDER — CEPHALEXIN 500 MG PO CAPS: 500.0000 mg | ORAL_CAPSULE | Freq: Two times a day (BID) | ORAL | 0 refills | Status: DC

## 2016-03-28 MED ORDER — DEXTROSE 5 % IV SOLN
1.0000 g | Freq: Once | INTRAVENOUS | Status: AC
  Administered 2016-03-28: 1 g via INTRAVENOUS
  Filled 2016-03-28: qty 10

## 2016-03-28 NOTE — ED Triage Notes (Signed)
Pt from home via [facility]. Pt was found unresponsive next to bed by daughter. EMS sts pt had agonal breathing, was put on NRB @ the scene. Per daughter, this mimics episodes of when she has had seizures in the past. Hx of R sided paralysis from previous CVA, & a few seizures. Pt alert and nonverbal on arrival. Doesn't know what happened. Denies pain @ this moment.

## 2016-03-28 NOTE — ED Provider Notes (Signed)
[facility] DEPT Provider Note   CSN: [id number] Arrival date & time: 03/28/16  0448     History   Chief Complaint Chief Complaint  Patient presents with  . Altered Mental Status    HPI [name] is a 56 y.o. female.  Patient presents to the emergency department for evaluation after being found on the floor in her room. Patient's daughter, who is her primary caregiver, heard a bump come from her room. Daughter went to the room and found her face down next to the bed. Daughter reports that she was not breathing well, unresponsive. EMS was called. Patient appeared to be confused but breathing improved somewhat daughter had originally seen. At arrival to the ER, patient's answers yes and no questions but is not able to speak at all. She does have a stated history of aphasia secondary to stroke. Level V Caveat due to aphasia.      Past Medical History:  Diagnosis Date  . Aphasia due to stroke ([place])   . Bipolar affective disorder ([place])   . Depression   . Diabetes mellitus ([place])    ?type (05/29/2013)  . Episodic confusion   . 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 Hypertension   . Schizophrenia ([place])    [name] 11/28/2007 (05/29/2013)  . Stroke [facility]) 05/27/2013    Patient Active Problem List   Diagnosis Date Noted  . Seizures ([place]) 09/29/2015  . Urine leukocytes 01/08/2015  . Expressive aphasia 01/08/2015  . Malnutrition of moderate degree ([place]) 11/11/2014  . Syncope 11/08/2014  . Dehydration 11/08/2014  . Staring spell 07/09/2014  . Adhesive capsulitis of right shoulder 06/04/2014  . Palpitations 04/02/2014  . Essential hypertension 01/05/2014  . DM (diabetes mellitus) ([place]) 01/05/2014  . Paranoid schizophrenia ([place]) 12/28/2013  . Cerebral embolism with cerebral infarction ([place]) 12/05/2013  . Sinus tachycardia 12/05/2013  . 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 Spastic hemiplegia affecting dominant side ([place]) 09/12/2013  . CVA (cerebral infarction) 06/04/2013  . Dysphagia 06/02/2013  . Benign polycythemia 06/02/2013  . Malignant hypertension 05/28/2013  . DM (diabetes mellitus) type II controlled, neurological manifestation ([place]) 05/27/2013  . Acute embolic stroke ([place]) [id number]  . 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 Hypertensive emergency 05/27/2013  . Stroke ([place]) 05/27/2013  . Aphasia complicating stroke ([place]) 05/27/2013    Past Surgical History:  Procedure Laterality Date  . CESAREAN SECTION  ~ 52  . TEE WITHOUT CARDIOVERSION N/A 12/08/2013   Procedure: TRANSESOPHAGEAL ECHOCARDIOGRAM (TEE);  Surgeon: Candee Furbish, MD;  Location: [facility] ENDOSCOPY;  Service: Cardiovascular;  Laterality: N/A;  . TUBAL LIGATION      OB History    No data available       Home Medications    Prior to Admission medications   Medication Sig Start Date End Date Taking? Authorizing Provider  ACCU-CHEK AVIVA PLUS test strip USE ONE STRIP TO TEST BEFORE MEALS AND AND AT BEDTIME 03/21/16  Yes Tresa Garter, MD  ACCU-CHEK SOFTCLIX LANCETS lancets TEST BEFORE MEALS AND AT BEDTIME 07/26/15  Yes Tresa Garter, MD  acetaminophen (TYLENOL) 325 MG tablet Take 1-2 tablets (325-650 mg total) by mouth every 4 (four) hours as needed for mild pain (available over the counter). 07/08/13  Yes Ivan Anchors Love, PA-C  amLODipine (NORVASC) 10 MG tablet Take 1 tablet (10 mg total) by mouth daily. 01/20/16  Yes Arnoldo Morale, MD  aspirin 325 MG tablet Take 325 mg by mouth daily. Reported on 09/25/2015   Yes Historical Provider, MD  atorvastatin (LIPITOR) 20 MG tablet Take 1 tablet (20 mg total) by mouth daily. 01/20/16  Yes Arnoldo Morale, MD  baclofen (LIORESAL) 10 MG tablet TAKE 1 TABLET BY MOUTH TWO TIMES DAILY TO HELP WITH TONE/SPASMS 01/20/16  Yes Arnoldo Morale, MD  gabapentin (NEURONTIN) 100 MG capsule Take 1 capsule (100 mg total) by mouth 3  (three) times daily. 01/20/16  Yes Arnoldo Morale, MD  hydrochlorothiazide (HYDRODIURIL) 25 MG tablet TAKE ONE (1) TABLET BY MOUTH EVERY DAY 01/20/16  Yes Arnoldo Morale, MD  Insulin Glargine (LANTUS SOLOSTAR) 100 UNIT/ML Solostar Pen Inject 10 Units into the skin daily at 10 pm. Once DAILY Patient taking differently: Inject 10 Units into the skin daily as needed (blood sugar levels). Once DAILY 01/20/16  Yes Arnoldo Morale, MD  metoprolol (LOPRESSOR) 100 MG tablet Take 1.5 tablets (150 mg total) by mouth 2 (two) times daily. 01/20/16  Yes Arnoldo Morale, MD  polyethylene glycol powder (GLYCOLAX/MIRALAX) powder Take 17 g by mouth daily as needed (constipation). 02/22/15  Yes Lance Bosch, NP  protein supplement shake (PREMIER PROTEIN) LIQD Take 2 oz by mouth daily.   Yes Historical Provider, MD  SENNA LAX 8.6 MG tablet TAKE 1 TABLET BY MOUTH EVERY DAY AS NEEDED FOR CONSTIPATION 06/18/15  Yes Lance Bosch, NP  Spacer/Aero-Holding Chambers (E-Z SPACER) inhaler Use as instructed 06/18/15  Yes Lance Bosch, NP  UNIFINE PENTIPS 31G X 6 MM MISC USE AS PRESCRIBED BY PHYSICIAN 03/16/15  Yes Lance Bosch, NP    Family History Family History  Problem Relation Age of Onset  . Hypertension Mother   . Stroke Father   . Hypertension Father   . Diabetes Father     Social History Social History  Substance Use Topics  . Smoking status: Never Smoker  . Smokeless tobacco: Never Used  . Alcohol use No     Allergies   Patient has no known allergies.   Review of Systems Review of Systems  Unable to perform ROS: Patient nonverbal     Physical Exam Updated Vital Signs BP 156/93   Pulse 104   Temp 97.4 F (36.3 C) (Oral)   Resp 18   SpO2 98%   Physical Exam  Constitutional: She is oriented to person, place, and time. She appears well-developed and well-nourished. No distress.  HENT:  Head: Normocephalic and atraumatic.  Right Ear: Hearing normal.  Left Ear: Hearing normal.  Nose: Nose normal.    Mouth/Throat: Oropharynx is clear and moist and mucous membranes are normal.  Eyes: Conjunctivae and EOM are normal. Pupils are equal, round, and reactive to light.  Neck: Normal range of motion. Neck supple.  Cardiovascular: Regular rhythm, S1 normal and S2 normal.  Exam reveals no gallop and no friction rub.   No murmur heard. Pulmonary/Chest: Effort normal and breath sounds normal. No respiratory distress. She exhibits no tenderness.  Abdominal: Soft. Normal appearance and bowel sounds are normal. There is no hepatosplenomegaly. There is no tenderness. There is no rebound, no guarding, no tenderness at McBurney's point and negative Murphy's sign. No hernia.  Musculoskeletal: Normal range of motion.  Neurological: She is alert and oriented to person, place, and time. She has normal strength. A cranial nerve deficit (R facial droop) is present. No sensory deficit. Coordination normal. GCS eye subscore is 4. GCS verbal subscore is 4. GCS motor subscore is 6.  Skin: Skin is warm, dry and intact. No rash noted. No cyanosis.  Psychiatric: She has a normal mood and affect. Her speech is normal and behavior is normal. Thought content normal.  Nursing note and vitals reviewed.    ED Treatments / Results  Labs (all labs ordered are listed, but only abnormal results are displayed) Labs Reviewed  COMPREHENSIVE METABOLIC PANEL - Abnormal; Notable for the following:       Result Value   Glucose, Bld 153 (*)    Creatinine, Ser 1.03 (*)    All other components within normal limits  URINALYSIS, ROUTINE W REFLEX MICROSCOPIC - Abnormal; Notable for the following:    Color, Urine COLORLESS (*)    APPearance CLOUDY (*)    Hgb urine dipstick SMALL (*)    Protein, ur 30 (*)    Leukocytes, UA LARGE (*)    All other components within normal limits  URINALYSIS, MICROSCOPIC (REFLEX) - Abnormal; Notable for the following:    Bacteria, UA MANY (*)    Squamous Epithelial / LPF 6-30 (*)    All other  components within normal limits  CBG MONITORING, ED - Abnormal; Notable for the following:    Glucose-Capillary 150 (*)    All other components within normal limits  URINE CULTURE  CBC    EKG  EKG Interpretation  Date/Time:  Tuesday March 28 2016 04:58:02 EST Ventricular Rate:  103 PR Interval:    QRS Duration: 78 QT Interval:  326 QTC Calculation: 427 R Axis:   34 Text Interpretation:  Sinus tachycardia Anteroseptal infarct, old No significant change since last tracing Confirmed by POLLINA  MD, CHRISTOPHER [phone]) on 03/28/2016 5:19:41 AM       Radiology Ct Head Wo Contrast  Result Date: 03/28/2016 CLINICAL DATA:  Unresponsive patient. EXAM: CT HEAD WITHOUT CONTRAST CT CERVICAL SPINE WITHOUT CONTRAST TECHNIQUE: Multidetector CT imaging of the head and cervical spine was performed following the standard protocol without intravenous contrast. Multiplanar CT image reconstructions of the cervical spine were also generated. COMPARISON:  Head CT 09/25/2015 FINDINGS: CT HEAD FINDINGS Brain: No mass lesion, intraparenchymal hemorrhage or extra-axial collection. No evidence of acute cortical infarct. There is periventricular hypoattenuation compatible with chronic microvascular disease. Left MCA distribution encephalomalacia is unchanged. Vascular: No hyperdense vessel or unexpected calcification. Skull: Normal visualized skull base, calvarium and extracranial soft tissues. Sinuses/Orbits: No sinus fluid levels or advanced mucosal thickening. No mastoid effusion. Normal orbits. CT CERVICAL SPINE FINDINGS Alignment: No static subluxation. Facets are aligned. Occipital condyles are normally positioned. Skull base and vertebrae: No acute fracture. Soft tissues and spinal canal: No prevertebral fluid or swelling. No visible canal hematoma. Disc levels: No advanced spinal canal or neural foraminal stenosis. Upper chest: No pneumothorax, pulmonary nodule or pleural effusion. Other: Normal visualized  paraspinal cervical soft tissues. IMPRESSION: 1. No acute intracranial abnormality. 2. Left MCA territory encephalomalacia secondary to remote infarct. 3. No acute fracture or static subluxation of the cervical spine. Electronically Signed   By: Ulyses Jarred M.D.   On: 03/28/2016 06:27   Ct Cervical Spine Wo Contrast  Result Date: 03/28/2016 CLINICAL DATA:  Unresponsive patient. EXAM: CT HEAD WITHOUT CONTRAST CT CERVICAL SPINE WITHOUT CONTRAST TECHNIQUE: Multidetector CT imaging of the head and cervical spine was performed following the standard protocol without intravenous contrast. Multiplanar CT image reconstructions of the cervical spine were also generated. COMPARISON:  Head CT 09/25/2015 FINDINGS: CT HEAD FINDINGS Brain: No mass lesion, intraparenchymal hemorrhage or extra-axial collection. No evidence of acute cortical  infarct. There is periventricular hypoattenuation compatible with chronic microvascular disease. Left MCA distribution encephalomalacia is unchanged. Vascular: No hyperdense vessel or unexpected calcification. Skull: Normal visualized skull base, calvarium and extracranial soft tissues. Sinuses/Orbits: No sinus fluid levels or advanced mucosal thickening. No mastoid effusion. Normal orbits. CT CERVICAL SPINE FINDINGS Alignment: No static subluxation. Facets are aligned. Occipital condyles are normally positioned. Skull base and vertebrae: No acute fracture. Soft tissues and spinal canal: No prevertebral fluid or swelling. No visible canal hematoma. Disc levels: No advanced spinal canal or neural foraminal stenosis. Upper chest: No pneumothorax, pulmonary nodule or pleural effusion. Other: Normal visualized paraspinal cervical soft tissues. IMPRESSION: 1. No acute intracranial abnormality. 2. Left MCA territory encephalomalacia secondary to remote infarct. 3. No acute fracture or static subluxation of the cervical spine. Electronically Signed   By: Ulyses Jarred M.D.   On: 03/28/2016 06:27      Procedures Procedures (including critical care time)  Medications Ordered in ED Medications  cefTRIAXone (ROCEPHIN) 1 g in dextrose 5 % 50 mL IVPB (1 g Intravenous New Bag/Given 03/28/16 0735)     Initial Impression / Assessment and Plan / ED Course  I have reviewed the triage vital signs and the nursing notes.  Pertinent labs & imaging results that were available during my care of the patient were reviewed by me and considered in my medical decision making (see chart for details).  Clinical Course    Patient presents to the emergency department for evaluation after an episode where she was found unresponsive on the floor. Daughter reports what sounds like agonal respirations and complete unresponsiveness. First responders and EMS report that she was more alert for them. Reviewing her records reveals that she does have a history of seizures. Reviewing neurology's notes reveals that she was supposed to be on Vimpat, but for some reason did not want to take it. She is not currently on any antiepileptics. During the period of evaluation here in the ER, patient has progressively become more awake and alert. She is actually now able to answer questions. This is consistent with postictal state after seizure.  CT head and cervical spine without acute abnormality. Blood work was normal. Urinalysis does show evidence of infection.  Case discussed with Dr. Cristobal Goldmann, on call for neurology. Patient's past history and current presentation and lab work discussed. He did not feel that the patient should be initiated on antiepileptics at this time. Recommends treatment of urinary tract infection follow-up with patient's neurologist.  Final Clinical Impressions(s) / ED Diagnoses   Final diagnoses:  Seizure ([place])  Urinary tract infection without hematuria, site unspecified    New Prescriptions New Prescriptions   No medications on file     Orpah Greek, MD 03/28/16 [phone]

## 2016-03-28 NOTE — ED Notes (Signed)
Pt to CT

## 2016-03-28 NOTE — ED Notes (Signed)
Pt back from CT

## 2016-03-28 NOTE — ED Notes (Signed)
CBG is 150.

## 2016-03-29 LAB — URINE CULTURE

## 2016-03-30 ENCOUNTER — Telehealth (HOSPITAL_BASED_OUTPATIENT_CLINIC_OR_DEPARTMENT_OTHER): Payer: Self-pay

## 2016-03-30 NOTE — Telephone Encounter (Signed)
Post ED Visit - Positive Culture Follow-up  Culture report reviewed by antimicrobial stewardship pharmacist:  []  Elenor Quinones, Pharm.D. []  Heide Guile, Pharm.D., BCPS []  Parks Neptune, Pharm.D. []  Alycia Rossetti, Pharm.D., BCPS []  [place], [place].D., BCPS, AAHIVP []  Legrand Como, Pharm.D., BCPS, AAHIVP []  Milus Glazier, Pharm.D. []  Stephens November, Pharm.DStanford Breed, Pharm.D.  Positive urine culture, 20,000 colonies -> Lactobacillus Species Treated with Cephalexin. Chart reviewed by Carlisle Cater PA "No change"  Dortha Kern 03/30/2016, 10:14 AM

## 2016-04-24 ENCOUNTER — Other Ambulatory Visit: Payer: Self-pay | Admitting: Internal Medicine

## 2016-04-24 ENCOUNTER — Telehealth: Payer: Self-pay | Admitting: Family Medicine

## 2016-04-24 ENCOUNTER — Encounter: Payer: Self-pay | Admitting: Family Medicine

## 2016-04-24 ENCOUNTER — Other Ambulatory Visit: Payer: Self-pay | Admitting: Family Medicine

## 2016-04-24 ENCOUNTER — Ambulatory Visit: Payer: Medicaid Other | Attending: Family Medicine | Admitting: Family Medicine

## 2016-04-24 VITALS — BP 146/87 | HR 88 | Temp 98.5°F | Ht 65.0 in | Wt 151.2 lb

## 2016-04-24 DIAGNOSIS — I10 Essential (primary) hypertension: Secondary | ICD-10-CM | POA: Diagnosis not present

## 2016-04-24 DIAGNOSIS — E0800 Diabetes mellitus due to underlying condition with hyperosmolarity without nonketotic hyperglycemic-hyperosmolar coma (NKHHC): Secondary | ICD-10-CM

## 2016-04-24 DIAGNOSIS — H6123 Impacted cerumen, bilateral: Secondary | ICD-10-CM | POA: Diagnosis not present

## 2016-04-24 DIAGNOSIS — J0100 Acute maxillary sinusitis, unspecified: Secondary | ICD-10-CM

## 2016-04-24 DIAGNOSIS — Z9851 Tubal ligation status: Secondary | ICD-10-CM | POA: Insufficient documentation

## 2016-04-24 DIAGNOSIS — F319 Bipolar disorder, unspecified: Secondary | ICD-10-CM | POA: Diagnosis not present

## 2016-04-24 DIAGNOSIS — E870 Hyperosmolality and hypernatremia: Secondary | ICD-10-CM | POA: Insufficient documentation

## 2016-04-24 DIAGNOSIS — E119 Type 2 diabetes mellitus without complications: Secondary | ICD-10-CM | POA: Diagnosis not present

## 2016-04-24 DIAGNOSIS — I69351 Hemiplegia and hemiparesis following cerebral infarction affecting right dominant side: Secondary | ICD-10-CM | POA: Insufficient documentation

## 2016-04-24 DIAGNOSIS — G8110 Spastic hemiplegia affecting unspecified side: Secondary | ICD-10-CM

## 2016-04-24 DIAGNOSIS — Z7982 Long term (current) use of aspirin: Secondary | ICD-10-CM | POA: Diagnosis not present

## 2016-04-24 DIAGNOSIS — F209 Schizophrenia, unspecified: Secondary | ICD-10-CM | POA: Diagnosis not present

## 2016-04-24 LAB — POCT GLYCOSYLATED HEMOGLOBIN (HGB A1C): HEMOGLOBIN A1C: 6.1

## 2016-04-24 LAB — GLUCOSE, POCT (MANUAL RESULT ENTRY): POC Glucose: 117 mg/dl — AB (ref 70–99)

## 2016-04-24 MED ORDER — CETIRIZINE HCL 10 MG PO TABS: 10.0000 mg | ORAL_TABLET | Freq: Every day | ORAL | 1 refills | Status: DC

## 2016-04-24 MED ORDER — AMOXICILLIN 500 MG PO CAPS: 500.0000 mg | ORAL_CAPSULE | Freq: Three times a day (TID) | ORAL | 0 refills | Status: DC

## 2016-04-24 MED ORDER — INSULIN ASPART 100 UNIT/ML ~~LOC~~ SOLN: SUBCUTANEOUS | 3 refills | Status: DC

## 2016-04-24 NOTE — Progress Notes (Signed)
Subjective:  Patient ID: [name], female    DOB: 02/14/1961  Age: 56 y.o. MRN: [id number]  CC: Otalgia (both); Nasal Congestion; Cough; Diabetes; and Hypertension (dtr states its been running high)   HPI [name] is a 57 year old female with history of type 2 diabetes mellitus (A1c 6.1 from today who takes Lantus 10 units  intermittently due to controlled blood sugars), stroke with right-sided spastic hemiparesis and aphasia (followed by Neurology), hypertension, questionable seizure in 09/2015 (antiseizure medications on hold pending EEG by neurology) who comes in for a follow-up visit. History is obtained from daughter as patient is nonverbal but she is able to communicate her needs by pointing or writing. No seizures since her last visit.  Daughter states the patient has been pointing to her ears indicating they hurt; she has also had productive cough which is worse in the mornings and at night and associated runny nose sneezing but no fevers.  Past Medical History:  Diagnosis Date  . Aphasia due to stroke ([place])   . Bipolar affective disorder ([place])   . Depression   . Diabetes mellitus ([place])    ?type (05/29/2013)  . Episodic confusion   . 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 Hypertension   . Schizophrenia ([place])    [name] 11/28/2007 (05/29/2013)  . Stroke [facility]) 05/27/2013    Past Surgical History:  Procedure Laterality Date  . CESAREAN SECTION  ~ 53  . TEE WITHOUT CARDIOVERSION N/A 12/08/2013   Procedure: TRANSESOPHAGEAL ECHOCARDIOGRAM (TEE);  Surgeon: Candee Furbish, MD;  Location: [facility] ENDOSCOPY;  Service: Cardiovascular;  Laterality: N/A;  . TUBAL LIGATION      No Known Allergies   Outpatient Medications Prior to Visit  Medication Sig Dispense Refill  . ACCU-CHEK AVIVA PLUS test strip USE ONE STRIP TO TEST BEFORE MEALS AND AND AT BEDTIME 100 each 5  . ACCU-CHEK SOFTCLIX LANCETS lancets FOR TEST DOSE BEFORE MEALS AND AT  BEDTIME 100 each 5  . acetaminophen (TYLENOL) 325 MG tablet Take 1-2 tablets (325-650 mg total) by mouth every 4 (four) hours as needed for mild pain (available over the counter).    Marland Kitchen amLODipine (NORVASC) 10 MG tablet Take 1 tablet (10 mg total) by mouth daily. 30 tablet 5  . aspirin 325 MG tablet Take 325 mg by mouth daily. Reported on 09/25/2015    . atorvastatin (LIPITOR) 20 MG tablet Take 1 tablet (20 mg total) by mouth daily. 30 tablet 5  . baclofen (LIORESAL) 10 MG tablet TAKE 1 TABLET BY MOUTH TWO TIMES DAILY TO HELP WITH TONE/SPASMS 60 tablet 2  . gabapentin (NEURONTIN) 100 MG capsule Take 1 capsule (100 mg total) by mouth 3 (three) times daily. 90 capsule 5  . hydrochlorothiazide (HYDRODIURIL) 25 MG tablet TAKE ONE (1) TABLET BY MOUTH EVERY DAY 30 tablet 5  . metoprolol (LOPRESSOR) 100 MG tablet Take 1.5 tablets (150 mg total) by mouth 2 (two) times daily. 90 tablet 5  . polyethylene glycol powder (GLYCOLAX/MIRALAX) powder Take 17 g by mouth daily as needed (constipation). 527 g 3  . protein supplement shake (PREMIER PROTEIN) LIQD Take 2 oz by mouth daily.    Marland Kitchen UNIFINE PENTIPS 31G X 6 MM MISC USE AS PRESCRIBED BY PHYSICIAN 100 each 11  . Insulin Glargine (LANTUS SOLOSTAR) 100 UNIT/ML Solostar Pen Inject 10 Units into the skin daily at 10 pm. Once DAILY (Patient taking differently: Inject 10 Units into the skin daily as needed (blood  sugar levels). Once DAILY) 5 pen 11  . SENNA LAX 8.6 MG tablet TAKE 1 TABLET BY MOUTH EVERY DAY AS NEEDED FOR CONSTIPATION (Patient not taking: Reported on 04/24/2016) 30 tablet 2  . Spacer/Aero-Holding Chambers (E-Z SPACER) inhaler Use as instructed (Patient not taking: Reported on 04/24/2016) 1 each 2  . cephALEXin (KEFLEX) 500 MG capsule Take 1 capsule (500 mg total) by mouth 2 (two) times daily. 14 capsule 0   No facility-administered medications prior to visit.     ROS Review of Systems  Unable to perform ROS: Patient nonverbal    Objective:  BP (!)  146/87 (BP Location: Left Arm, Patient Position: Sitting, Cuff Size: Small)   Pulse 88   Temp 98.5 F (36.9 C) (Oral)   Ht 5\' 5"  (1.651 m)   Wt 151 lb 3.2 oz (68.6 kg)   SpO2 100%   BMI 25.16 kg/m   BP/Weight 04/24/2016 03/28/2016 [id number]  Systolic BP 123456 0000000 -  Diastolic BP 87 87 -  Wt. (Lbs) 151.2 - 139  BMI 25.16 - 23.13      Physical Exam  Constitutional: She is oriented to person, place, and time. She appears well-developed and well-nourished.  HENT:   cerumen obscuring bilateral tympanic membranes Normal oropharynx Right maxillary sinus tenderness  Cardiovascular: Normal rate, normal heart sounds and intact distal pulses.   No murmur heard. Pulmonary/Chest: Effort normal and breath sounds normal. She has no wheezes. She has no rales. She exhibits no tenderness.  Abdominal: Soft. Bowel sounds are normal. She exhibits no distension and no mass. There is no tenderness.  Musculoskeletal:  Right hemiparesis  Neurological: She is alert and oriented to person, place, and time.  Aphasic    Lab Results  Component Value Date   HGBA1C 6.1 04/24/2016    Assessment & Plan:   1. Diabetes mellitus due to underlying condition with hyperosmolarity without coma, without long-term current use of insulin (HCC) Controlled with A1c of 6.1 Discontinue Lantus to prevent hypoglycemia Placed on NovoLog sliding scale - Glucose (CBG) - HgB A1c  2. Essential hypertension Slightly elevated blood pressure If still elevated at next visit I will adjust her antihypertensive regimen. Low-sodium diet No regimen changes  3. Bilateral impacted cerumen Patient is refusing bilateral ear irrigation in the clinic  4. Acute non-recurrent maxillary sinusitis We'll place an antibiotic due to sinus tenderness Continue antihistamine  5. CVA with right hemiparesis Stable She has an aide who assists with her ADL.  Meds ordered this encounter  Medications  . cetirizine (ZYRTEC) 10 MG tablet     Sig: Take 1 tablet (10 mg total) by mouth daily.    Dispense:  30 tablet    Refill:  1  . amoxicillin (AMOXIL) 500 MG capsule    Sig: Take 1 capsule (500 mg total) by mouth 3 (three) times daily.    Dispense:  30 capsule    Refill:  0  . insulin aspart (NOVOLOG) 100 UNIT/ML injection    Sig: 0-12 units as per sliding scale    Dispense:  10 mL    Refill:  3    Follow-up: Return in about 3 months (around 07/22/2016) for Follow-up on diabetes mellitus.   Arnoldo Morale MD

## 2016-04-24 NOTE — Telephone Encounter (Signed)
[facility] called needs clarification on insulin

## 2016-04-25 ENCOUNTER — Other Ambulatory Visit: Payer: Self-pay | Admitting: Family Medicine

## 2016-04-25 MED ORDER — INSULIN ASPART 100 UNIT/ML ~~LOC~~ SOLN: SUBCUTANEOUS | 3 refills | Status: AC

## 2016-04-25 NOTE — Telephone Encounter (Signed)
New prescriptions sent over

## 2016-04-27 ENCOUNTER — Other Ambulatory Visit: Payer: Self-pay | Admitting: Internal Medicine

## 2016-04-27 ENCOUNTER — Other Ambulatory Visit: Payer: Self-pay | Admitting: Family Medicine

## 2016-04-27 DIAGNOSIS — Z1231 Encounter for screening mammogram for malignant neoplasm of breast: Secondary | ICD-10-CM

## 2016-05-02 ENCOUNTER — Ambulatory Visit (INDEPENDENT_AMBULATORY_CARE_PROVIDER_SITE_OTHER): Payer: Medicaid Other | Admitting: Sports Medicine

## 2016-05-02 ENCOUNTER — Encounter: Payer: Self-pay | Admitting: Sports Medicine

## 2016-05-02 DIAGNOSIS — M79675 Pain in left toe(s): Secondary | ICD-10-CM

## 2016-05-02 DIAGNOSIS — B351 Tinea unguium: Secondary | ICD-10-CM

## 2016-05-02 DIAGNOSIS — M79674 Pain in right toe(s): Secondary | ICD-10-CM

## 2016-05-02 DIAGNOSIS — M79676 Pain in unspecified toe(s): Secondary | ICD-10-CM | POA: Diagnosis not present

## 2016-05-02 DIAGNOSIS — E1142 Type 2 diabetes mellitus with diabetic polyneuropathy: Secondary | ICD-10-CM

## 2016-05-02 NOTE — Progress Notes (Signed)
Patient ID: [name], female   DOB: 12/09/1960, 56 y.o.   MRN: [id number]   Subjective: [name] is a 56 y.o. female patient with history of type 2 diabetes who presents to office today complaining of long, painful nails; unable to trim. Patient states that the glucose reading this morning was good. Patient is assisted by daughter who helps to report this since patient can not clearly talk since after stroke. Patient's daughter denies any new changes in medication or new problems. Patient denies any new cramping, numbness, burning or tingling in the legs.  Patient Active Problem List   Diagnosis Date Noted  . Seizures ([place]) 09/29/2015  . Urine leukocytes 01/08/2015  . Expressive aphasia 01/08/2015  . Malnutrition of moderate degree ([place]) 11/11/2014  . Syncope 11/08/2014  . Dehydration 11/08/2014  . Staring spell 07/09/2014  . Adhesive capsulitis of right shoulder 06/04/2014  . Palpitations 04/02/2014  . Essential hypertension 01/05/2014  . DM (diabetes mellitus) ([place]) 01/05/2014  . Paranoid schizophrenia ([place]) 12/28/2013  . Cerebral embolism with cerebral infarction ([place]) 12/05/2013  . Sinus tachycardia 12/05/2013  . 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 Spastic hemiplegia affecting dominant side ([place]) 09/12/2013  . CVA (cerebral infarction) 06/04/2013  . Dysphagia 06/02/2013  . Benign polycythemia 06/02/2013  . Malignant hypertension 05/28/2013  . DM (diabetes mellitus) type II controlled, neurological manifestation ([place]) 05/27/2013  . Acute embolic stroke ([place]) [id number]  . 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 Hypertensive emergency 05/27/2013  . Stroke ([place]) 05/27/2013  . Aphasia complicating stroke ([place]) 05/27/2013   Current Outpatient Prescriptions on File Prior to Visit  Medication Sig Dispense Refill  . ACCU-CHEK AVIVA PLUS test strip USE ONE STRIP TO TEST  BEFORE MEALS AND AND AT BEDTIME 100 each 5  . ACCU-CHEK SOFTCLIX LANCETS lancets FOR TEST DOSE BEFORE MEALS AND AT BEDTIME 100 each 5  . acetaminophen (TYLENOL) 325 MG tablet Take 1-2 tablets (325-650 mg total) by mouth every 4 (four) hours as needed for mild pain (available over the counter).    Marland Kitchen amLODipine (NORVASC) 10 MG tablet Take 1 tablet (10 mg total) by mouth daily. 30 tablet 5  . amoxicillin (AMOXIL) 500 MG capsule Take 1 capsule (500 mg total) by mouth 3 (three) times daily. 30 capsule 0  . aspirin 325 MG tablet Take 325 mg by mouth daily. Reported on 09/25/2015    . atorvastatin (LIPITOR) 20 MG tablet Take 1 tablet (20 mg total) by mouth daily. 30 tablet 5  . baclofen (LIORESAL) 10 MG tablet TAKE ONE (1) TABLET BY MOUTH TWO (2) TIMES DAILY TO HELP WITH TONE/SPASMS 60 each 2  . cetirizine (ZYRTEC) 10 MG tablet Take 1 tablet (10 mg total) by mouth daily. 30 tablet 1  . gabapentin (NEURONTIN) 100 MG capsule Take 1 capsule (100 mg total) by mouth 3 (three) times daily. 90 capsule 5  . hydrochlorothiazide (HYDRODIURIL) 25 MG tablet TAKE ONE (1) TABLET BY MOUTH EVERY DAY 30 tablet 5  . insulin aspart (NOVOLOG) 100 UNIT/ML injection 0-12 units as per sliding scale 10 mL 3  . metoprolol (LOPRESSOR) 100 MG tablet Take 1.5 tablets (150 mg total) by mouth 2 (two) times daily. 90 tablet 5  . polyethylene glycol powder (GLYCOLAX/MIRALAX) powder Take 17 g by mouth daily as needed (constipation). 527 g 3  . protein supplement shake (PREMIER PROTEIN) LIQD Take 2 oz by mouth daily.    Marland Kitchen  SENNA LAX 8.6 MG tablet TAKE 1 TABLET BY MOUTH EVERY DAY AS NEEDED FOR CONSTIPATION (Patient not taking: Reported on 04/24/2016) 30 tablet 2  . Spacer/Aero-Holding Chambers (E-Z SPACER) inhaler Use as instructed (Patient not taking: Reported on 04/24/2016) 1 each 2  . UNIFINE PENTIPS 31G X 6 MM MISC USE AS PRESCRIBED BY PHYSICIAN 100 each 11   No current facility-administered medications on file prior to visit.    No Known  Allergies   Objective: General: Patient is awake, alert, and oriented x 2 and in no acute distress.  Integument: Skin is warm, dry and supple bilateral. Nails are tender, long, thickened and dystrophic with subungual debris, consistent with onychomycosis, 1-5 bilateral. Resolved interdigital macerations 1-4 bilateral, No signs of infection. No open lesions or preulcerative lesions present bilateral. Remaining integument unremarkable.  Vasculature:  Dorsalis Pedis pulse 0/4 bilateral. Posterior Tibial pulse  1/4 bilateral. Capillary fill time <3 sec 1-5 bilateral. No hair growth to the level of the digits.Temperature gradient within normal limits. No varicosities present bilateral. +1 pitting edema present bilateral R>L secondary to stroke.   Neurology: The patient has diminished sensation measured with a 5.07/10g Semmes Weinstein Monofilament at toes bilateral . Vibratory sensation diminished bilateral with tuning fork. No Babinski sign present bilateral.   Musculoskeletal: No gross pedal deformities noted bilateral. Muscular strength 4/5 in all lower extremity muscular groups on left, 3/5 on right without pain on range of motion; Stroke with right sided weakness. No tenderness with calf compression bilateral.  Assessment and Plan: Problem List Items Addressed This Visit    None    Visit Diagnoses    Dermatophytosis of nail    -  Primary   Pain in toes of both feet       Diabetic polyneuropathy associated with type 2 diabetes mellitus ([place])         -Examined patient. -Discussed and educated patient on diabetic foot care, especially with  regards to the vascular, neurological and musculoskeletal systems.  -Stressed the importance of good glycemic control and the detriment of not  controlling glucose levels in relation to the foot. -Mechanically debrided all nails 1-5 bilateral using sterile nail nipper and filed with dremel without incident  -Cont with good hygiene  -Recommend OTC  compression socks  -Answered all patient questions -Patient to return in 3 months for at risk foot care -Patient advised to call the office if any problems or questions arise in the meantime.  Landis Martins, DPM

## 2016-05-11 IMAGING — CT CT HEAD W/O CM
2 series · 15 of 30 positions shown, 17 images · non-contrast
Comparison: 11/08/2014

CLINICAL DATA: Aphasia

EXAM:
CT HEAD WITHOUT CONTRAST
TECHNIQUE: Contiguous axial images were obtained from the base of the skull
through the vertex without intravenous contrast.

[Series 2: head without · axial · non-contrast · 0.44mm/px · z∈[-118,+2]mm · 7 of 32 slices shown, 9 images]
[im 4/32  brain]
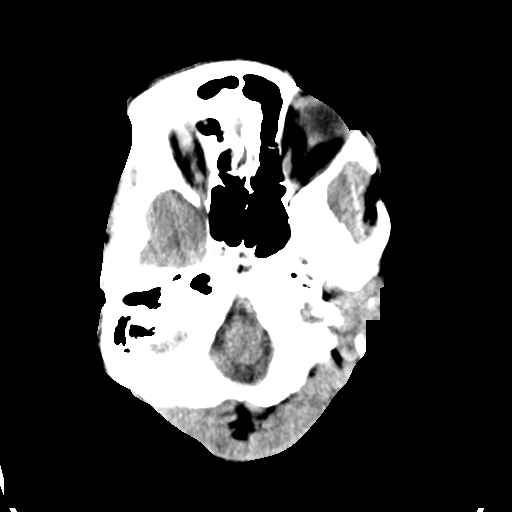
[im 4/32  bone]
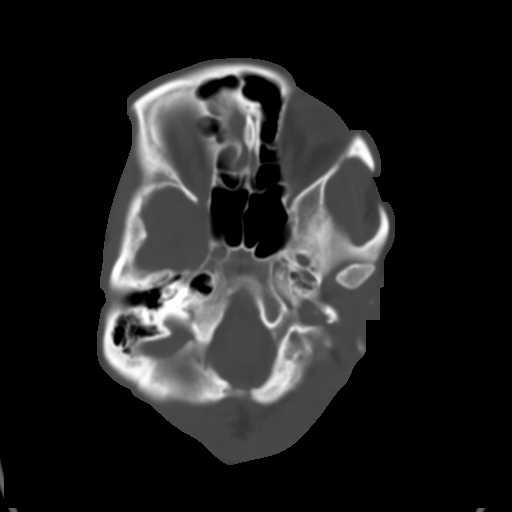
[im 8/32  brain]
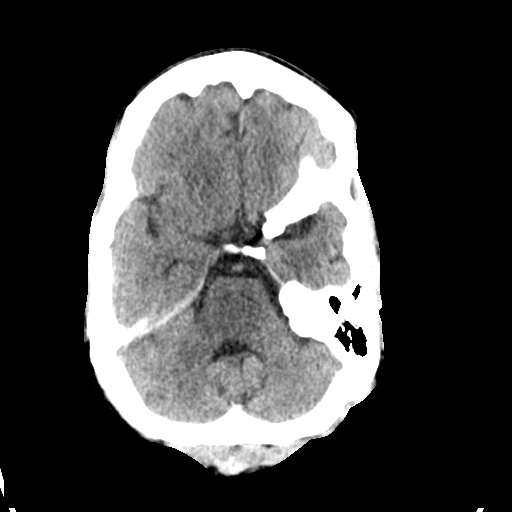
[im 12/32  brain]
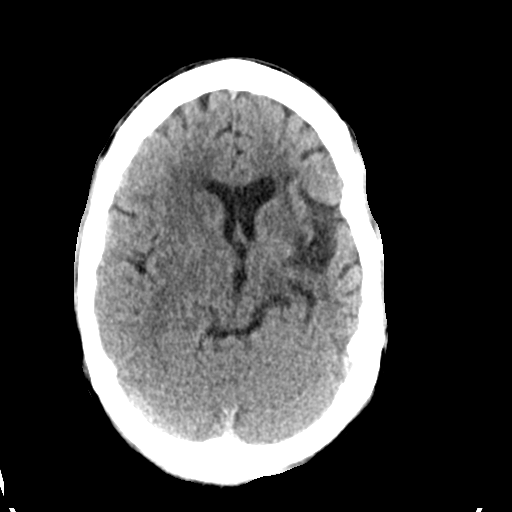
[im 16/32  brain]
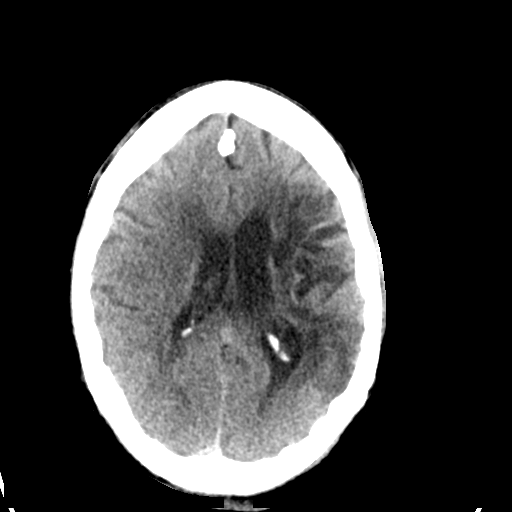
[im 20/32  brain]
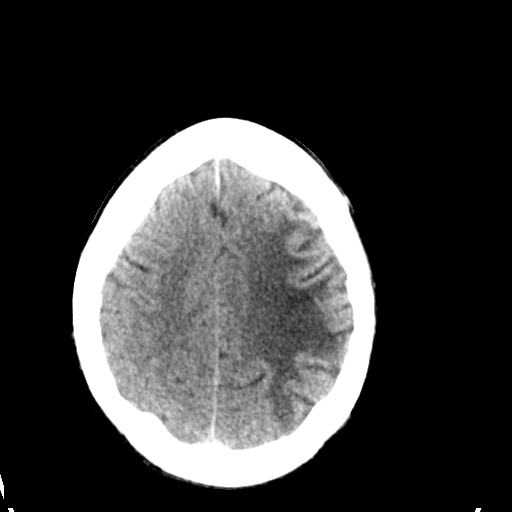
[im 20/32  bone]
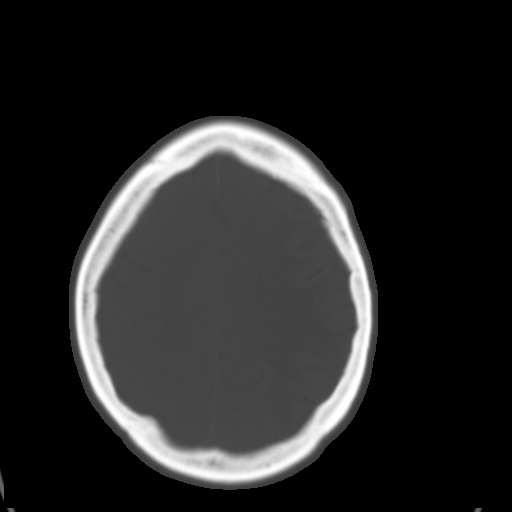
[im 24/32  brain]
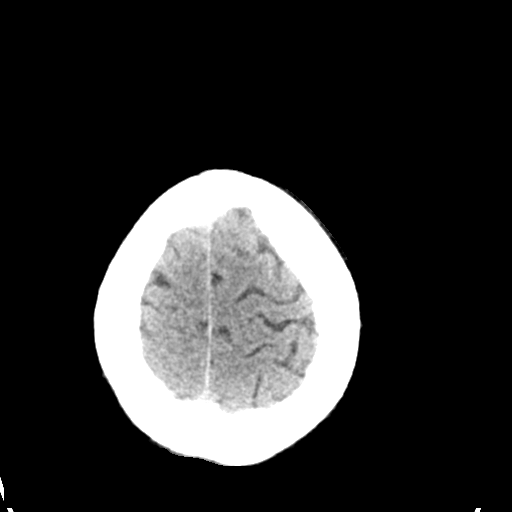
[im 28/32  brain]
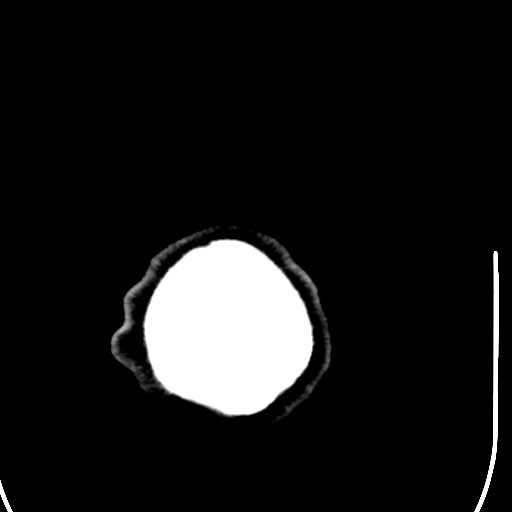

[Series 3: head bone · axial · 0.44mm/px · z∈[-119,+5]mm · 8 of 78 slices shown]
[im 8/78  bone]
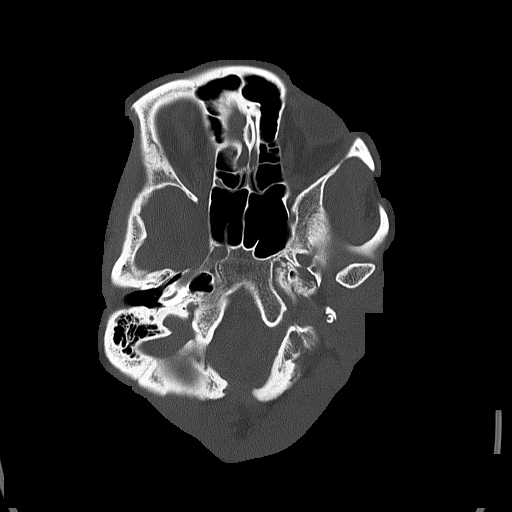
[im 16/78  bone]
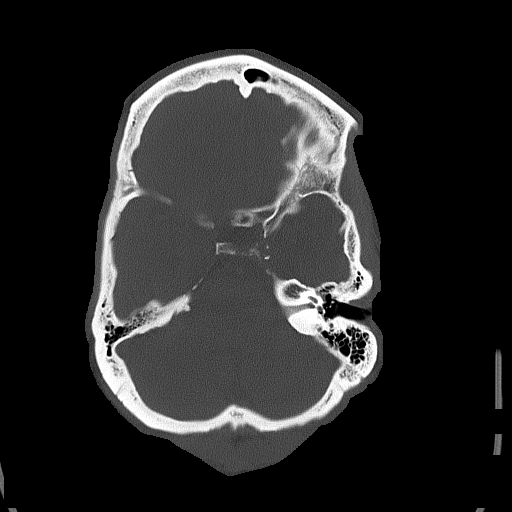
[im 24/78  bone]
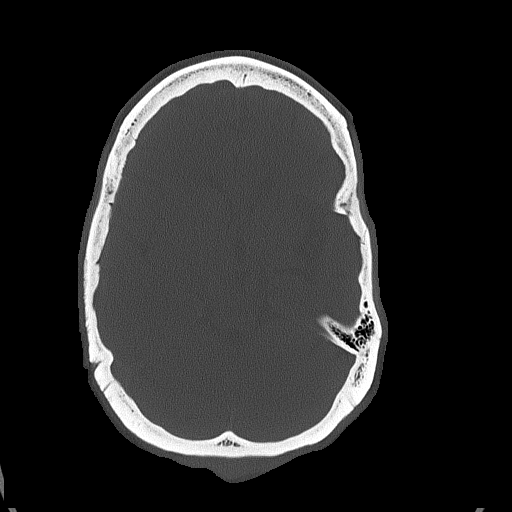
[im 35/78  bone]
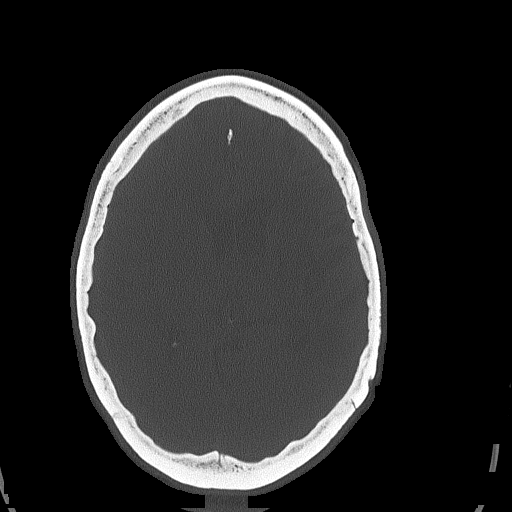
[im 43/78  bone]
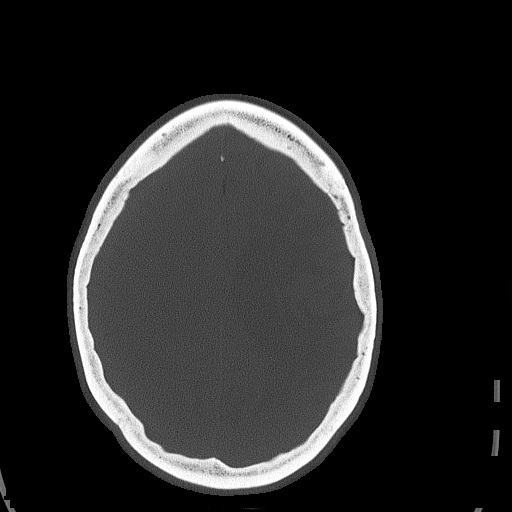
[im 54/78  bone]
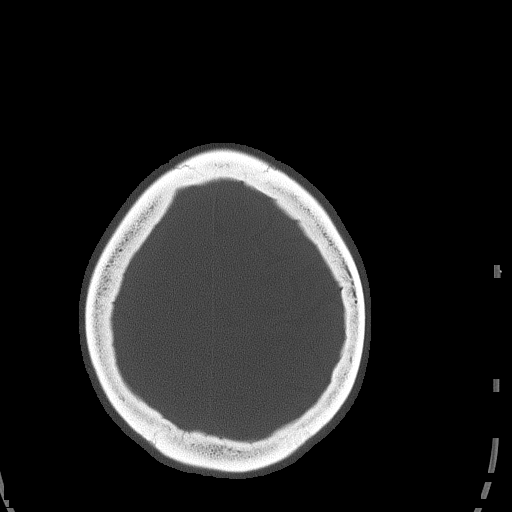
[im 62/78  bone]
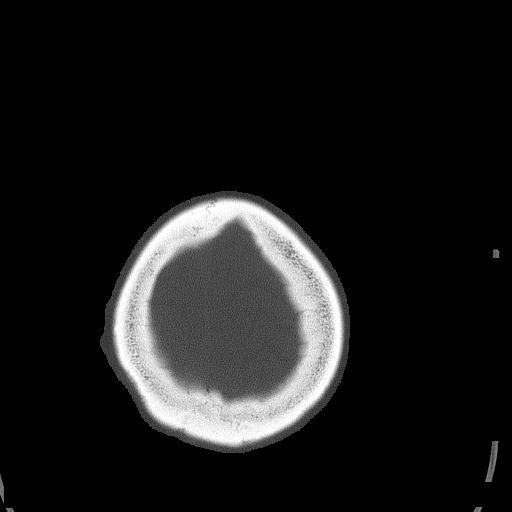
[im 70/78  bone]
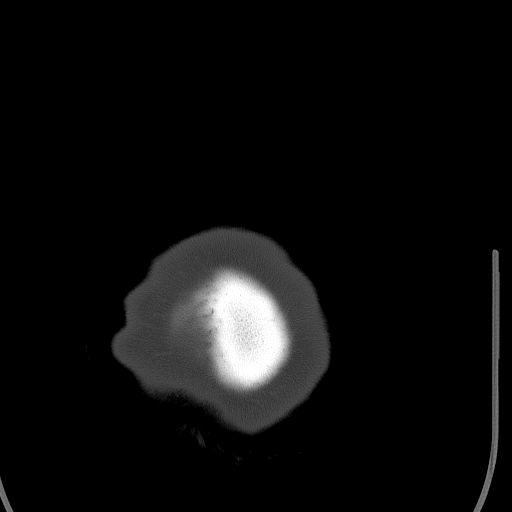

[15 of 30 positions shown; findings below may reference images not displayed]

FINDINGS: No skull fracture is noted. Paranasal sinuses and mastoid air cells
are unremarkable. Stable encephalomalacia and remote left MCA
infarct. Mild cerebral atrophy again noted. Stable periventricular
chronic white matter disease. No acute cortical infarction. No mass
lesion is noted on this unenhanced scan. No intracranial hemorrhage,
mass effect or midline shift.
IMPRESSION: Stable old left MCA infarct. No definite acute cortical infarction.
Stable mild cerebral atrophy and chronic white matter disease.

## 2016-06-02 ENCOUNTER — Ambulatory Visit
Admission: RE | Admit: 2016-06-02 | Discharge: 2016-06-02 | Disposition: A | Discharge status: Home / Self Care | Payer: Medicaid Other | Source: Ambulatory Visit | Attending: Family Medicine | Admitting: Family Medicine

## 2016-06-02 DIAGNOSIS — Z1231 Encounter for screening mammogram for malignant neoplasm of breast: Secondary | ICD-10-CM

## 2016-06-23 ENCOUNTER — Other Ambulatory Visit: Payer: Self-pay | Admitting: Family Medicine

## 2016-06-23 DIAGNOSIS — I69391 Dysphagia following cerebral infarction: Secondary | ICD-10-CM

## 2016-06-23 DIAGNOSIS — I69359 Hemiplegia and hemiparesis following cerebral infarction affecting unspecified side: Secondary | ICD-10-CM

## 2016-06-23 DIAGNOSIS — I69320 Aphasia following cerebral infarction: Secondary | ICD-10-CM | POA: Insufficient documentation

## 2016-06-24 ENCOUNTER — Encounter (HOSPITAL_COMMUNITY): Payer: Self-pay | Admitting: Emergency Medicine

## 2016-06-24 ENCOUNTER — Ambulatory Visit (HOSPITAL_COMMUNITY)
Admission: EM | Admit: 2016-06-24 | Discharge: 2016-06-24 | Disposition: A | Discharge status: Home / Self Care | Payer: Medicaid Other | Attending: Internal Medicine | Admitting: Internal Medicine

## 2016-06-24 DIAGNOSIS — B372 Candidiasis of skin and nail: Secondary | ICD-10-CM

## 2016-06-24 MED ORDER — FLUCONAZOLE 200 MG PO TABS: 200.0000 mg | ORAL_TABLET | Freq: Every day | ORAL | 0 refills | Status: AC

## 2016-06-24 MED ORDER — NYSTATIN 100000 UNIT/GM EX POWD: Freq: Two times a day (BID) | CUTANEOUS | 0 refills | Status: DC

## 2016-06-24 NOTE — ED Triage Notes (Signed)
Rash that started today.  Red, burning rash under breasts

## 2016-06-24 NOTE — Discharge Instructions (Addendum)
Rash under breasts is consistent with a yeast infection.  These are more common as the weather warms up.  Prescriptions for nystatin powder and [facility]conazole [facility] [address].  Recheck or followup with primary care provider if not improving over the next week or so.

## 2016-06-24 NOTE — ED Provider Notes (Signed)
[facility]    CSN: [id number] Arrival date & time: 06/24/16  1426     History   Chief Complaint Chief Complaint  Patient presents with  . Rash    HPI [name] is a 56 y.o. female. She presents today with redness and chafing under her breasts, irritated feeling, a little bit itchy. This seemed to pop up quickly, overnight. Had a checkup with her primary care provider yesterday, which went well.    HPI  Past Medical History:  Diagnosis Date  . Aphasia due to stroke ([place])   . Bipolar affective disorder ([place])   . Depression   . Diabetes mellitus ([place])    ?type (05/29/2013)  . Episodic confusion   . 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 Hypertension   . Schizophrenia ([place])    [name] 11/28/2007 (05/29/2013)  . Stroke [facility]) 05/27/2013    Patient Active Problem List   Diagnosis Date Noted  . Seizures ([place]) 09/29/2015  . Urine leukocytes 01/08/2015  . Expressive aphasia 01/08/2015  . Malnutrition of moderate degree ([place]) 11/11/2014  . Syncope 11/08/2014  . Dehydration 11/08/2014  . Staring spell 07/09/2014  . Adhesive capsulitis of right shoulder 06/04/2014  . Palpitations 04/02/2014  . Essential hypertension 01/05/2014  . DM (diabetes mellitus) ([place]) 01/05/2014  . Paranoid schizophrenia ([place]) 12/28/2013  . Cerebral embolism with cerebral infarction ([place]) 12/05/2013  . Sinus tachycardia 12/05/2013  . 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 Spastic hemiplegia affecting dominant side ([place]) 09/12/2013  . CVA (cerebral infarction) 06/04/2013  . Dysphagia 06/02/2013  . Benign polycythemia 06/02/2013  . Malignant hypertension 05/28/2013  . DM (diabetes mellitus) type II controlled, neurological manifestation ([place]) 05/27/2013  . Acute embolic stroke ([place]) 95/18/8416  . 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 Hypertensive  emergency 05/27/2013  . Stroke ([place]) 05/27/2013  . Aphasia complicating stroke ([place]) 05/27/2013    Past Surgical History:  Procedure Laterality Date  . BREAST BIOPSY    . CESAREAN SECTION  ~ 57  . TEE WITHOUT CARDIOVERSION N/A 12/08/2013   Procedure: TRANSESOPHAGEAL ECHOCARDIOGRAM (TEE);  Surgeon: Candee Furbish, MD;  Location: [facility] ENDOSCOPY;  Service: Cardiovascular;  Laterality: N/A;  . TUBAL LIGATION       Home Medications    Prior to Admission medications   Medication Sig Start Date End Date Taking? Authorizing Provider  ACCU-CHEK AVIVA PLUS test strip USE ONE STRIP TO TEST BEFORE MEALS AND AND AT BEDTIME 03/21/16   Tresa Garter, MD  ACCU-CHEK SOFTCLIX LANCETS lancets FOR TEST DOSE BEFORE MEALS AND AT BEDTIME 04/24/16   Arnoldo Morale, MD  acetaminophen (TYLENOL) 325 MG tablet Take 1-2 tablets (325-650 mg total) by mouth every 4 (four) hours as needed for mild pain (available over the counter). 07/08/13   Ivan Anchors Love, PA-C  amLODipine (NORVASC) 10 MG tablet Take 1 tablet (10 mg total) by mouth daily. 01/20/16   Arnoldo Morale, MD  amoxicillin (AMOXIL) 500 MG capsule Take 1 capsule (500 mg total) by mouth 3 (three) times daily. 04/24/16   Arnoldo Morale, MD  aspirin 325 MG tablet Take 325 mg by mouth daily. Reported on 09/25/2015    Historical Provider, MD  atorvastatin (LIPITOR) 20 MG tablet Take 1 tablet (20 mg total) by mouth daily. 01/20/16   Arnoldo Morale, MD  baclofen (LIORESAL) 10 MG tablet TAKE ONE (1) TABLET BY MOUTH TWO (2) TIMES  DAILY TO HELP WITH TONE/SPASMS 04/25/16   Arnoldo Morale, MD  cetirizine (ZYRTEC) 10 MG tablet TAKE ONE (1) TABLET BY MOUTH EVERY DAY 06/23/16   Arnoldo Morale, MD  fluconazole (DIFLUCAN) 200 MG tablet Take 1 tablet (200 mg total) by mouth daily. 06/24/16 07/01/16  Sherlene Shams, MD  gabapentin (NEURONTIN) 100 MG capsule Take 1 capsule (100 mg total) by mouth 3 (three) times daily. 01/20/16   Arnoldo Morale, MD  hydrochlorothiazide (HYDRODIURIL) 25 MG tablet TAKE ONE (1)  TABLET BY MOUTH EVERY DAY 01/20/16   Arnoldo Morale, MD  insulin aspart (NOVOLOG) 100 UNIT/ML injection 0-12 units as per sliding scale 04/25/16   Arnoldo Morale, MD  metoprolol (LOPRESSOR) 100 MG tablet Take 1.5 tablets (150 mg total) by mouth 2 (two) times daily. 01/20/16   Arnoldo Morale, MD  nystatin (MYCOSTATIN/NYSTOP) powder Apply topically 2 (two) times daily. 06/24/16   Sherlene Shams, MD  polyethylene glycol powder (GLYCOLAX/MIRALAX) powder Take 17 g by mouth daily as needed (constipation). 02/22/15   Lance Bosch, NP  protein supplement shake (PREMIER PROTEIN) LIQD Take 2 oz by mouth daily.    Historical Provider, MD  SENNA LAX 8.6 MG tablet TAKE 1 TABLET BY MOUTH EVERY DAY AS NEEDED FOR CONSTIPATION Patient not taking: Reported on 04/24/2016 06/18/15   Lance Bosch, NP  Spacer/Aero-Holding Chambers (E-Z SPACER) inhaler Use as instructed Patient not taking: Reported on 04/24/2016 06/18/15   Lance Bosch, NP  UNIFINE PENTIPS 31G X 6 MM MISC USE AS PRESCRIBED BY PHYSICIAN 03/16/15   Lance Bosch, NP    Family History Family History  Problem Relation Age of Onset  . Hypertension Mother   . Stroke Father   . Hypertension Father   . Diabetes Father     Social History Social History  Substance Use Topics  . Smoking status: Never Smoker  . Smokeless tobacco: Never Used  . Alcohol use No     Allergies   Patient has no known allergies.   Review of Systems Review of Systems  All other systems reviewed and are negative.    Physical Exam Triage Vital Signs ED Triage Vitals  Enc Vitals Group     BP 06/24/16 1516 123/79     Pulse Rate 06/24/16 1516 61     Resp 06/24/16 1516 18     Temp 06/24/16 1516 98.5 F (36.9 C)     Temp Source 06/24/16 1516 Oral     SpO2 06/24/16 1516 100 %     Weight --      Height --      Pain Score 06/24/16 1513 8     Pain Loc --    Updated Vital Signs BP 123/79 (BP Location: Left Arm)   Pulse 61   Temp 98.5 F (36.9 C) (Oral)   Resp 18   SpO2  100%   Physical Exam  Constitutional: She is oriented to person, place, and time. No distress.  Nicely groomed, sitting up in wheelchair  HENT:  Head: Atraumatic.  Eyes:  Conjugate gaze observed, no eye redness/discharge  Neck: Neck supple.  Cardiovascular: Normal rate.   Pulmonary/Chest: No respiratory distress.  Abdominal: She exhibits no distension.  Musculoskeletal: Normal range of motion.  Neurological: She is alert and oriented to person, place, and time.  Skin: Skin is warm and dry.  Erythema and chafing under the breasts, with irregular border, consistent with Candida  Nursing note and vitals reviewed.    UC Treatments / Results  Procedures Procedures (including critical care time) None today  Final Clinical Impressions(s) / UC Diagnoses   Final diagnoses:  Candidal intertrigo   Rash under breasts is consistent with a yeast infection.  These are more common as the weather warms up.  [facility] nystatin powder [facility] [address]  Recheck or followup with primary care provider if not improving over the next week or so.    New Prescriptions New Prescriptions   FLUCONAZOLE (DIFLUCAN) 200 MG TABLET    Take 1 tablet (200 mg total) by mouth daily.   NYSTATIN (MYCOSTATIN/NYSTOP) POWDER    Apply topically 2 (two) times daily.     Sherlene Shams, MD 06/25/16 [phone]

## 2016-06-26 ENCOUNTER — Encounter: Payer: Self-pay | Admitting: Gastroenterology

## 2016-07-17 ENCOUNTER — Ambulatory Visit (INDEPENDENT_AMBULATORY_CARE_PROVIDER_SITE_OTHER): Payer: Medicaid Other | Admitting: Neurology

## 2016-07-17 ENCOUNTER — Encounter: Payer: Self-pay | Admitting: Neurology

## 2016-07-17 VITALS — BP 126/77 | HR 82 | Wt 157.4 lb

## 2016-07-17 DIAGNOSIS — I63512 Cerebral infarction due to unspecified occlusion or stenosis of left middle cerebral artery: Secondary | ICD-10-CM

## 2016-07-17 DIAGNOSIS — R4701 Aphasia: Secondary | ICD-10-CM

## 2016-07-17 DIAGNOSIS — G8110 Spastic hemiplegia affecting unspecified side: Secondary | ICD-10-CM | POA: Diagnosis not present

## 2016-07-17 DIAGNOSIS — R569 Unspecified convulsions: Secondary | ICD-10-CM | POA: Diagnosis not present

## 2016-07-17 NOTE — Progress Notes (Signed)
PATIENT: [name] DOB: 04-19-1960  REASON FOR VISIT: Hospital follow up for stroke HISTORY FROM: daughter and patient  HISTORY OF PRESENT ILLNESS: 56 y.o. [name] female who comes to the office for first hospital follow up post hospital discharge for stroke. She has a history of HTN, DM, bipolar disorder, who was admitted on 05/27/13 with right facial droop and aphasia. 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 Infarcts felt to be embolic due to unknown source and she was placed on ASA for secondary stroke prevention.  Patient had progressive neurologic worsening on 05/29/13 pm with decrease in LOC and right sided weakness. EEG negative for seizures and CCT without significant changes; felt patient with progression of symptoms due to relative hypotension and recommended allowing significantly high permissive hypertension in this patient. Hgb A1c was 14.1 in hospital, now 5.9.  LDL was 130. Hypercoagulable workup negative. 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 No other intracranial flow-limiting stenosis or embolic disease evident. Areas of low density in the left MCA territory consistent with infarction. 2D Echocardiogram with EF of 55-60% with no source of embolus. TEE was not done due to hypotension in hospital. Patient with resultant right hemiparesis and aphasia.  Has had PEG tube removed by radiology in June. Eating well and drinking well. Remains aphasic. Contemplating Botox injections to the wrist and finger flexors, hamstrings, and gastrocnemius by Dr.  Letta Pate. Using Baclofen for spasticity and gabapentin for pain. 103 year old daughter is providing all care.   01/05/14 follow up - she still takes full dose ASA and zocor for stroke prevention. Follows with PCP for BP and DM. RLE muscle strength improved but RUE still flaccid. Remains aphasic with only "Thank you" and "yes/no". She had inpt rehab followed by home PT/OT but then discharged from home PT/OT but no outpt PT/OT initiated yet. Daughter is going to check the status of outpt PT/OT.  04/02/14 follow up - patient has been doing the same. Still has significant aphasia and RUE weakness, but she was able to walk with semi-walker now. She follows with Dr. Read Drivers and will start PT and botox injection again. Her loop recorder never put in, will have to refer her again. She went to ER on 03/17/14 for episode of holding head, do not respond on commands, keep sticking in and out of her tougue for about 5 min, no shaking jerking or LOC. Daughter concerned and pt sent to ER, repeat CT no acute changes and pt back to baseline.   07/09/14 follow up - patient has been doing the same. She was referred to Dr. Rayann Heman for loop recorder placement, however she refused again. Blood pressure 122/82 in clinic today, she still has significant aphasia and right sided weakness. As per daughter, patient recently started to have staring spells with drooling at the right mouth, no shaking jerking, tongue biting, b/b incontinence, lasting several minutes.  02/02/15 follow up - pt has been doing the same. Had EEG in office which was negative for seizure. I offered home EEG long term monitoring but not done yet and I think daughter declined that test.  Daughter also declined seizure medication at that time. However, as per daughter, pt has no more episodes like before. She continued follow up with Dr. Read Drivers and will have botox for spasticity. She still has PT/OT to work with her.   09/29/15 follow up - she had ER visit on  09/25/15 due to seizure. She went to bathroom, family member heard patient fall, and found her face down with generalized shaking, 'foaming' at mouth' - symptoms persisted 1-2 minutes, patient was unresponsive during episode, and confused for several minutes after. On arrival to ED, patients mental status and function described by family as consistent w baseline. She was given keppra 500mg  IV. She was d/c from ER with prescription of keppra.  Daughter has not filled the medication yet. However, stated that pt was drowsy sleepy since the seizure but easily arousable. At baseline, she has intermittent confusion and agitation. BP recently high, on amlodipine, HCTZ and metoprolol. Today 136/89. Stated that glucose controlled well.   01/06/16 follow up - patient has been doing the same. Still has aphasia and right-sided weakness. Still wheelchair-bound. No seizure episode. However, as per daughter, patient developed behavioral changes, intermittent agitation, not revealing to come to doctor appointments, now waiting to taking care of herself, less active and seems depressed. Patient is going to have psychiatry referral soon. Patient also refused Vimpat for seizure control. So far patient was not on any seizure medications. BP 121/83.  Interval history During the interval time, patient has been doing the same. Still has right hemiparesis and aphasia. No seizure-like activity as per daughter. Did not repeat EEG last time due to missed appointment. Patient and daughter against seizure medication at this time, we'll not repeat EEG at this time. BP is stable at home, today 126/77. Glucose controlled well, off Lantus, only on SSI at this time. Daughter stated last A1c was good. Patient had about a one time, but refused further Botox due to painful injection.   REVIEW OF SYSTEMS: Full 14 system review of systems performed and notable only for: eye itching, ringing in ears, runny nose, light sensitivity, eye pain, leg  swelling, cold intolerance, excessive thirst, constipation, frequent waking, joint pain, aching muscles, walking difficulty, speech difficulty, weakness, agitation and confusion.  ALLERGIES: No Known Allergies  HOME MEDICATIONS: Outpatient Medications Prior to Visit  Medication Sig Dispense Refill  . ACCU-CHEK AVIVA PLUS test strip USE ONE STRIP TO TEST BEFORE MEALS AND AND AT BEDTIME 100 each 5  . ACCU-CHEK SOFTCLIX LANCETS lancets FOR TEST DOSE BEFORE MEALS AND AT BEDTIME 100 each 5  . acetaminophen (TYLENOL) 325 MG tablet Take 1-2 tablets (325-650 mg total) by mouth every 4 (four) hours as needed for mild pain (available over the counter).    Marland Kitchen amLODipine (NORVASC) 10 MG tablet Take 1 tablet (10 mg total) by mouth daily. 30 tablet 5  . aspirin 325 MG tablet Take 325 mg by mouth daily. Reported on 09/25/2015    . atorvastatin (LIPITOR) 20 MG tablet Take 1 tablet (20 mg total) by mouth daily. 30 tablet 5  . baclofen (LIORESAL) 10 MG tablet TAKE ONE (1) TABLET BY MOUTH TWO (2) TIMES DAILY TO HELP WITH TONE/SPASMS 60 each 2  . cetirizine (ZYRTEC) 10 MG tablet TAKE ONE (1) TABLET BY MOUTH EVERY DAY 30 tablet 3  . gabapentin (NEURONTIN) 100 MG capsule Take 1 capsule (100 mg total) by mouth 3 (three) times daily. 90 capsule 5  . hydrochlorothiazide (HYDRODIURIL) 25 MG tablet TAKE ONE (1) TABLET BY  MOUTH EVERY DAY 30 tablet 5  . insulin aspart (NOVOLOG) 100 UNIT/ML injection 0-12 units as per sliding scale 10 mL 3  . metoprolol (LOPRESSOR) 100 MG tablet Take 1.5 tablets (150 mg total) by mouth 2 (two) times daily. 90 tablet 5  . nystatin (MYCOSTATIN/NYSTOP) powder Apply topically 2 (two) times daily. 45 g 0  . polyethylene glycol powder (GLYCOLAX/MIRALAX) powder Take 17 g by mouth daily as needed (constipation). 527 g 3  . protein supplement shake (PREMIER PROTEIN) LIQD Take 2 oz by mouth daily.    . SENNA LAX 8.6 MG tablet TAKE 1 TABLET BY MOUTH EVERY DAY AS NEEDED FOR CONSTIPATION 30 tablet 2  .  Spacer/Aero-Holding Chambers (E-Z SPACER) inhaler Use as instructed 1 each 2  . UNIFINE PENTIPS 31G X 6 MM MISC USE AS PRESCRIBED BY PHYSICIAN 100 each 11  . amoxicillin (AMOXIL) 500 MG capsule Take 1 capsule (500 mg total) by mouth 3 (three) times daily. (Patient not taking: Reported on 07/17/2016) 30 capsule 0   No facility-administered medications prior to visit.     PHYSICAL EXAM Vitals:   07/17/16 1328  BP: 126/77  Pulse: 82  Weight: 157 lb 6.4 oz (71.4 kg)   Generalized: Well developed, in no acute distress  Head: normocephalic and atraumatic. Oropharynx benign  Neck: Supple, no carotid bruits  Cardiac: Regular rate rhythm, no murmur   Neurological examination  Mentation: Orientation unable to assess due to aphasia.  Severe expressive aphasia with only "Thank you" and no sentences. Able to comprehend 1-2 simple central commands, but not peripheral commands. Unable to name or repeat.  Cranial nerve II-XII: Fundoscopic exam not able to see through. Pupils were equal round reactive to light, extraocular movements were full.  Facial droop on the right, normal on the left. Hearing was intact to finger rubbing bilaterally. Tongue in middle. Head turning and shoulder shrug are asymmetric, impaired on right. Motor: Right upper extremity distal strength 0/5, proximal 2/5, right lower extremity strength is 4-/5 proxima and 3-/5 distal. Left upper and lower extremity strength 5/5. Increased tone on the right. Sensory: Sensory testing is intact to soft touch LUE and LLE. Evidence of extinction is noted on the right. Coordination: Cerebellar testing not able to do on the right. Gait and station: Gait is not tested, in wheelchair Reflexes: Deep tendon reflexes are symmetric and decreased bilaterally.   I have personally reviewed the radiological images below and agree with the radiology interpretations.  EEG 05/2013 - This is an abnormal EEG secondary to slowing over the left hmeisphere with  frequent left hemispheric sharp acitivty as well.. This finding is suggestive of a focal disturbance that is etiologically nonspecific, but may include a mass lesion or post-ictal focal slowing among other possibilities.   CT of the brain 05/27/2013 No acute intracranial pathology.   CT brain 03/17/14 - No acute abnormality. 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  CTA head 05/2013- Severe stenosis of the left middle cerebral artery at the M1 segment with markedly diminished visualization of the more distal left MCA branches. No other intracranial flow-limiting stenosis or embolic disease evident. Areas of low density in the left MCA territory consistent with infarction. No hemorrhagic transformation. Question new low density in the pons, not definite.  MRI of the brain 05/27/2013 Multiple foci of acute ischemia within the left middle cerebral artery territory. In addition, however punctate focus of acute ischemia within the left occipital lobe and right thalamus (both of which would be posterior circulation). All  areas of ischemia have similar acuity. No hemorrhagic conversion. Symmetric abnormal bright signal within the lenticulostriate nuclei (basal ganglia) in addition to abnormal temporoccipital periventricular T2 hyperintense signal. These findings may reflect sequelae of acute severe hypertension (PRES) but, it can also be seen with osmotic demyelination, autoimmune disorders such as scleroderma (which can have associated vasculopathy), less likely infectious encephalitis. Moderate to severe white matter changes, in a distribution which can be seen with demyelination, with superimposed possible chronic small vessel ischemic disease though, are nonspecific.   MRI brain 12/05/13 - No evidence of acute ischemia, specifically no acute LEFT sided stroke. Remote large LEFT middle cerebral artery territory hemorrhagic infarct. Similar abnormal signal within the basal ganglia and pons  could reflect metabolic disorder, sequelae of possible PRES, less likely pontine/extra pontine myelinolysis, findings are similar. Remote bilateral thalamus lacunar infarcts. Moderate white matter changes suggest chronic small vessel ischemic disease.  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 Mild luminal irregularity of the intracranial vessels which can be seen with intracranial atherosclerosis though, may also be seen with vasculopathy.   2D Echocardiogram EF 55-60% with no source of embolus.   Carotid Doppler No evidence of hemodynamically significant internal carotid artery stenosis. Vertebral artery flow is antegrade.   TCD emboli detection -  Negative  EEG 07/13/14 - This is a mildly abnormal EEG due to the presence of left hemispheric slowing and focal left frontotemporal irritability which may be compatible with patient's history of large left hemispheric infarct. No definite epileptiform activity is noted. Overall no significant change compared with EEG dated 05/31/14.  Component     Latest Ref Rng 05/28/2013 05/30/2013 08/22/2013 12/05/2013  PTT Lupus Anticoagulant     28.0 - 43.0 secs 37.9     PTTLA Confirmation     <8.0 secs NOT APPL     PTTLA 4:1 Mix     28.0 - 43.0 secs NOT APPL     DRVVT     <42.9 secs 35.1     Drvvt confirmation     <1.11 Ratio NOT APPL     dRVVT Incubated 1:1 Mix     <42.9 secs NOT APPL     Lupus Anticoagulant     NOT DETECTED NOT DETECTED     Cholesterol     0 - 200 mg/dL 201 (H)     Triglycerides     <150 mg/dL 145     HDL     >39 mg/dL 42     Total CHOL/HDL Ratio      4.8     VLDL     0 - 40 mg/dL 29     LDL (calc)     0 - 99 mg/dL 130 (H)     Anticardiolipin IgG     <23 GPL U/mL 5 (L)     Anticardiolipin IgM     <11 MPL U/mL 2 (L)     Anticardiolipin IgA     <22 APL U/mL 10 (L)     Hgb A1c MFr Bld      14.6 (H) 14.1 (H) 6.8   Mean Plasma Glucose     <117 mg/dL 372 (H) 358 (H)    C3  Complement     90 - 180 mg/dL 115     Complement C4, Body Fluid     10 - 40 mg/dL 31     Compl, Total (CH50)     31 - 60 U/mL >60 (H)     ANA  Ser Ql     NEGATIVE NEGATIVE     RPR     NON REACTIVE NON REACTIVE     Sed Rate     0 - 22 mm/hr 25 (H)     AntiThromb III Func     75 - 120 % 125 (H)     Protein C Activity     75 - 133 % 200 (H)     Protein C, Total     72 - 160 % 165 (H)     Protein S Activity     69 - 129 % 120     Protein S Ag, Total     60 - 150 % 133     Homocysteine     4.0 - 15.4 umol/L 8.0     Recommendations-PTGENE:      (NOTE)     HIV     NON REACTIVE NON REACTIVE     TSH     0.350 - 4.500 uIU/mL    1.690    ASSESSMENT: 56 y.o. year old AA female  has a past medical history of Hypertension; Depression, Diabetes mellitus; Bipolar affective disorder, Schizophrenia followed up in clinic for Left MCA distribution infarct from 05/27/13. 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 MRA showed left MCA occlusion. Infarcts felt to be embolic secondary to unknown source, although not able to rule out large vessel asthero due to severe HTN and uncontrolled DM.  Stroke workup negative including carotid Doppler, TCD MES, hypercoagulable workup. Patient refused loop recorder. Residual right hemiparesis and severe aphasia. Needs assistance with all ADL's. Currently BP and glucose in good control. She had a EEG in March 2015 which was negative for seizure but left hemisphere slowing but with frequent sharp activity that has some spread to the right frontal regions at times. Pt had staring spells with drooling early 2016 and repeat EEG negative for seizure but left hemisphere slowing and focal left frontotemporal frequent sharp activity.  Long term EEG and seizure medication declined by daughter in the past. Pt had GTC seizure 09/25/15 and was  given one dose keppra. Due to baseline sleepiness and intermittent confusion agitation, prescribed vimpat for seizure control. However, patient developed symptoms and signs of depression, Vimpat stopped. So far no recurrent seizure, patient and family refuse seizure medications or EEG. Had PMR follow-up, but refused further Botox. Has not seen psychiatrist yet.  PLAN: - continue ASA and lipitor for stroke prevention  - continue psychiatry referral for depression / agitation management if needed - maintain seizure precautions. - Follow up with primary care physician for stroke risk factor modification. Recommend maintain blood pressure goal <130/80, diabetes with hemoglobin A1c goal below 6.5% and lipids with LDL cholesterol goal below 70 mg/dL.  - check BP and glucose at home and record - be more active, and increase home exercise  - follow up as needed.  I spent more than 25 minutes of face to face time with the patient. Greater than 50% of time was spent in counseling and coordination of care. We have discussed about psychiatry follow-up, aggressive home exercises and seizure precautions.   No orders of the defined types were placed in this encounter.    No orders of the defined types were placed in this encounter.   Patient Instructions  - continue ASA and lipitor for stroke prevention  -  continue psychiatry referral for depression / agitation management if needed - maintain seizure precautions. - Follow up with primary care physician for stroke risk factor modification. Recommend maintain blood pressure goal <130/80, diabetes with hemoglobin A1c goal below 6.5% and lipids with LDL cholesterol goal below 70 mg/dL.  - check BP and glucose at home and record - be more active, and increase home exercise  - follow up as needed.  Rosalin Hawking, MD PhD [facility] [address] [place], [place] [address] [phone]

## 2016-07-17 NOTE — Patient Instructions (Signed)
-   continue ASA and lipitor for stroke prevention  - continue psychiatry referral for depression / agitation management if needed - maintain seizure precautions. - Follow up with primary care physician for stroke risk factor modification. Recommend maintain blood pressure goal <130/80, diabetes with hemoglobin A1c goal below 6.5% and lipids with LDL cholesterol goal below 70 mg/dL.  - check BP and glucose at home and record - be more active, and increase home exercise  - follow up as needed.

## 2016-07-18 ENCOUNTER — Ambulatory Visit: Payer: Medicaid Other | Admitting: Gastroenterology

## 2016-07-26 ENCOUNTER — Other Ambulatory Visit: Payer: Self-pay | Admitting: Family Medicine

## 2016-07-26 DIAGNOSIS — G8110 Spastic hemiplegia affecting unspecified side: Secondary | ICD-10-CM

## 2016-08-01 ENCOUNTER — Encounter: Payer: Self-pay | Admitting: Sports Medicine

## 2016-08-01 ENCOUNTER — Ambulatory Visit (INDEPENDENT_AMBULATORY_CARE_PROVIDER_SITE_OTHER): Payer: Medicaid Other | Admitting: Sports Medicine

## 2016-08-01 DIAGNOSIS — M79674 Pain in right toe(s): Secondary | ICD-10-CM

## 2016-08-01 DIAGNOSIS — M79676 Pain in unspecified toe(s): Secondary | ICD-10-CM | POA: Diagnosis not present

## 2016-08-01 DIAGNOSIS — M2141 Flat foot [pes planus] (acquired), right foot: Secondary | ICD-10-CM

## 2016-08-01 DIAGNOSIS — M2142 Flat foot [pes planus] (acquired), left foot: Secondary | ICD-10-CM

## 2016-08-01 DIAGNOSIS — E1142 Type 2 diabetes mellitus with diabetic polyneuropathy: Secondary | ICD-10-CM

## 2016-08-01 DIAGNOSIS — M79675 Pain in left toe(s): Secondary | ICD-10-CM

## 2016-08-01 DIAGNOSIS — B351 Tinea unguium: Secondary | ICD-10-CM

## 2016-08-01 DIAGNOSIS — R600 Localized edema: Secondary | ICD-10-CM

## 2016-08-01 DIAGNOSIS — Z8673 Personal history of transient ischemic attack (TIA), and cerebral infarction without residual deficits: Secondary | ICD-10-CM

## 2016-08-01 NOTE — Progress Notes (Signed)
Patient ID: [name], female   DOB: [birth year], 56 y.o.   MRN: [id number]   Subjective: [name] is a 56 y.o. female patient with history of type 2 diabetes who presents to office today complaining of long, painful nails; unable to trim. Patient states that the glucose reading this morning was good. Patient is assisted by daughter who helps to report this since patient can not clearly talk since after stroke. Patient's daughter denies any new changes in medication or new problems. Reports that her mom has difficulty using the compression stockings and will like to see if there is other alternatives for the edema in her mother's leg. Patient denies any new cramping, numbness, burning or tingling in the legs.  Patient Active Problem List   Diagnosis Date Noted  . Seizures ([place]) 09/29/2015  . Urine leukocytes 01/08/2015  . Expressive aphasia 01/08/2015  . Malnutrition of moderate degree ([place]) 11/11/2014  . Syncope 11/08/2014  . Dehydration 11/08/2014  . Staring spell 07/09/2014  . Adhesive capsulitis of right shoulder 06/04/2014  . Palpitations 04/02/2014  . Essential hypertension 01/05/2014  . DM (diabetes mellitus) ([place]) 01/05/2014  . Paranoid schizophrenia ([place]) 12/28/2013  . Cerebral embolism with cerebral infarction ([place]) 12/05/2013  . Sinus tachycardia 12/05/2013  . 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 Spastic hemiplegia affecting dominant side ([place]) 09/12/2013  . CVA (cerebral infarction) 06/04/2013  . Dysphagia 06/02/2013  . Benign polycythemia 06/02/2013  . Malignant hypertension 05/28/2013  . DM (diabetes mellitus) type II controlled, neurological manifestation ([place]) 05/27/2013  . Acute embolic stroke ([place]) 09/10/7626  . Aphasia 05/27/2013  . 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 Hypertensive emergency 05/27/2013  . Stroke ([place]) 05/27/2013  . Aphasia  complicating stroke 31/51/7616   Current Outpatient Prescriptions on File Prior to Visit  Medication Sig Dispense Refill  . ACCU-CHEK AVIVA PLUS test strip USE ONE STRIP TO TEST BEFORE MEALS AND AND AT BEDTIME 100 each 5  . ACCU-CHEK SOFTCLIX LANCETS lancets FOR TEST DOSE BEFORE MEALS AND AT BEDTIME 100 each 5  . acetaminophen (TYLENOL) 325 MG tablet Take 1-2 tablets (325-650 mg total) by mouth every 4 (four) hours as needed for mild pain (available over the counter).    Marland Kitchen amLODipine (NORVASC) 10 MG tablet Take 1 tablet (10 mg total) by mouth daily. 30 tablet 5  . aspirin 325 MG tablet Take 325 mg by mouth daily. Reported on 09/25/2015    . atorvastatin (LIPITOR) 20 MG tablet Take 1 tablet (20 mg total) by mouth daily. 30 tablet 5  . baclofen (LIORESAL) 10 MG tablet TAKE ONE (1) TABLET BY MOUTH TWO (2) TIMES DAILY TO HELP WITH TONE/SPASMS 60 each 2  . cetirizine (ZYRTEC) 10 MG tablet TAKE ONE (1) TABLET BY MOUTH EVERY DAY 30 tablet 3  . gabapentin (NEURONTIN) 100 MG capsule Take 1 capsule (100 mg total) by mouth 3 (three) times daily. 90 capsule 5  . hydrochlorothiazide (HYDRODIURIL) 25 MG tablet TAKE ONE (1) TABLET BY MOUTH EVERY DAY 30 tablet 5  . insulin aspart (NOVOLOG) 100 UNIT/ML injection 0-12 units as per sliding scale 10 mL 3  . metoprolol (LOPRESSOR) 100 MG tablet Take 1.5 tablets (150 mg total) by mouth 2 (two) times daily. 90 tablet 5  . nystatin (MYCOSTATIN/NYSTOP) powder Apply topically 2 (two) times daily. 45 g 0  . polyethylene glycol powder (GLYCOLAX/MIRALAX) powder Take 17 g by mouth daily as needed (constipation).  527 g 3  . protein supplement shake (PREMIER PROTEIN) LIQD Take 2 oz by mouth daily.    . SENNA LAX 8.6 MG tablet TAKE 1 TABLET BY MOUTH EVERY DAY AS NEEDED FOR CONSTIPATION 30 tablet 2  . Spacer/Aero-Holding Chambers (E-Z SPACER) inhaler Use as instructed 1 each 2  . UNIFINE PENTIPS 31G X 6 MM MISC USE AS PRESCRIBED BY PHYSICIAN 100 each 11   No current  facility-administered medications on file prior to visit.    No Known Allergies   Objective: General: Patient is awake, alert, and oriented x 2 and in no acute distress.  Integument: Skin is warm, dry and supple bilateral. Nails are tender, long, thickened and dystrophic with subungual debris, consistent with onychomycosis, 1-5 bilateral. Resolved interdigital macerations 1-4 bilateral, No signs of infection. No open lesions or preulcerative lesions present bilateral. Remaining integument unremarkable.  Vasculature:  Dorsalis Pedis pulse 0/4 bilateral. Posterior Tibial pulse  1/4 bilateral. Capillary fill time <3 sec 1-5 bilateral. No hair growth to the level of the digits.Temperature gradient within normal limits. No varicosities present bilateral. +2 pitting edema present bilateral R>L secondary to stroke.   Neurology: The patient has diminished sensation measured with a 5.07/10g Semmes Weinstein Monofilament at toes bilateral . Vibratory sensation diminished bilateral with tuning fork. No Babinski sign present bilateral.   Musculoskeletal: No gross pedal deformities noted bilateral. Muscular strength 4/5 in all lower extremity muscular groups on left, 3/5 on right without pain on range of motion; Stroke with right sided weakness. No tenderness with calf compression bilateral.  Assessment and Plan: Problem List Items Addressed This Visit    None    Visit Diagnoses    Dermatophytosis of nail    -  Primary   Pain in toes of both feet       Diabetic polyneuropathy associated with type 2 diabetes mellitus (HCC)       Localized edema       History of stroke       Pes planus of both feet         -Examined patient. -Discussed and educated patient on diabetic foot care, especially with  regards to the vascular, neurological and musculoskeletal systems.  -Stressed the importance of good glycemic control and the detriment of not  controlling glucose levels in relation to the  foot. -Mechanically debrided all nails 1-5 bilateral using sterile nail nipper and filed with dremel without incident  -Cont with good hygiene  -Patient has continued edema and has tried to use over-the-counter compression stockings and have failed due to difficulty and weakness secondary to stroke and inability to wear stockings. Patient also is currently on diuretic medication with no improvement in lymphedema that is localized greater on her right lower limb as compared to her left, which has been worsening since her dysfunction due to stroke. A letter will be sent to her medical doctor for medical clearance and consideration for lymphedema pumps once we have medical clearance, we will proceed with ordering lymphedema problems from [employer] - To complete safe step form and cast for diabetic shoes at next office visit -Answered all patient questions -Patient to return in 3 months for at risk foot care -Patient advised to call the office if any problems or questions arise in the meantime.  Landis Martins, DPM

## 2016-08-03 ENCOUNTER — Telehealth: Payer: Self-pay | Admitting: *Deleted

## 2016-08-03 NOTE — Telephone Encounter (Addendum)
-----   Message from Landis Martins, [place] sent at 08/01/2016  6:56 PM EDT ----- Regarding: Medical clearance for lymphedema pumps Hi Valery  I think patient will be a good candidate for lymphedema pumps. Please send a letter to her medical doctor, Dr. Luciana Axe asking for medical clearance for pumps and once we have clearance, we will prescribe lymphedema pumps from [facility] Thanks Dr Cannon Kettle. 08/03/2016-Letter requesting medical clearance faxed to [facility]. 08/10/2016-Kierra - Dr. Merilyn Baba office states she received a 2nd request for medical clearance and they had faxed the medical clearance today. 08/11/2016-I spoke with Jeanette Caprice - Dr. Merilyn Baba office and informed her I did get her message and that my faxed may have just missed hers, but I do not have the medical clearance and asked that she fax again. Jeanette Caprice states she will fax again with the last dopplers. I gave her my office fax [phone]. Faxed required form, clinicals and demographics to [employer].08/16/2016-Received [employer] VIA Order Status states pt's insurance is not contracted with [employer] and they discussed personal pay with pt's dtr and are unable to reach an agreement on the order, but if pt or family contact them they will re-open the order. Faxed copy of letter to Dr. Cannon Kettle in [place] and filed to be scanned to pt's chart. Dr. Cannon Kettle faxed notice from [facility] office she will discuss with pt at next visit.

## 2016-08-04 ENCOUNTER — Encounter: Payer: Self-pay | Admitting: *Deleted

## 2016-08-09 DIAGNOSIS — I890 Lymphedema, not elsewhere classified: Secondary | ICD-10-CM | POA: Insufficient documentation

## 2016-08-22 ENCOUNTER — Other Ambulatory Visit: Payer: Self-pay | Admitting: Family Medicine

## 2016-08-22 DIAGNOSIS — G8110 Spastic hemiplegia affecting unspecified side: Secondary | ICD-10-CM

## 2016-08-22 DIAGNOSIS — I10 Essential (primary) hypertension: Secondary | ICD-10-CM

## 2016-09-22 ENCOUNTER — Other Ambulatory Visit: Payer: Self-pay | Admitting: Internal Medicine

## 2016-09-25 DIAGNOSIS — R609 Edema, unspecified: Secondary | ICD-10-CM | POA: Insufficient documentation

## 2016-10-03 ENCOUNTER — Encounter: Payer: Self-pay | Admitting: Gastroenterology

## 2016-10-03 ENCOUNTER — Ambulatory Visit (INDEPENDENT_AMBULATORY_CARE_PROVIDER_SITE_OTHER): Payer: Medicaid Other | Admitting: Gastroenterology

## 2016-10-03 VITALS — BP 146/86 | HR 104 | Ht 65.0 in | Wt 165.5 lb

## 2016-10-03 DIAGNOSIS — K5900 Constipation, unspecified: Secondary | ICD-10-CM | POA: Diagnosis not present

## 2016-10-03 DIAGNOSIS — Z1212 Encounter for screening for malignant neoplasm of rectum: Secondary | ICD-10-CM | POA: Diagnosis not present

## 2016-10-03 DIAGNOSIS — Z1211 Encounter for screening for malignant neoplasm of colon: Secondary | ICD-10-CM

## 2016-10-03 MED ORDER — LUBIPROSTONE 8 MCG PO CAPS: 8.0000 ug | ORAL_CAPSULE | Freq: Two times a day (BID) | ORAL | 11 refills | Status: DC

## 2016-10-03 NOTE — Patient Instructions (Signed)
Your provider has ordered Cologuard testing as an option for colon cancer screening. This is performed by [employer] and may be out of network with your insurance. PRIOR to completing the test, it is YOUR responsibility to contact your insurance about covered benefits for this test. Your out of pocket expense could be anywhere from $0.00 to $649.00.   When you call to check coverage with your insurer, please provide the following information:   -The ONLY provider of Cologuard is [place] code for Cologuard is [phone].  [employer] NPI # [id number]  -[employer] Tax ID # [id number]   We have already sent your demographic and insurance information to [employer] (phone number [phone]) and they should contact you within the next week regarding your test. If you have not heard from them within the next week, please call our office at [phone].  Normal BMI (Body Mass Index- based on height and weight) is between 19 and 25. Your BMI today is Body mass index is 27.54 kg/m. Marland Kitchen Please consider follow up  regarding your BMI with your Primary Care Provider.  We have sent the following medications to your pharmacy for you to pick up at your convenience:  Amitiza

## 2016-10-03 NOTE — Progress Notes (Signed)
[name]    [id number]    1960/04/23  Primary Care Physician:Boyd, Dola Factor, MD  Referring Physician: Bartholome Bill, MD [address] [place], [place] [address]  Chief complaint:  Constipation, colorectal cancer screening   HPI:  56 year old female with history of hypertension, diabetes, status post embolic CVA with right hemiparesis and expressive aphasia here for new patient visit to discuss colorectal cancer screening. She is accompanied by her daughter who is her primary caregiver. Patient never had colonoscopy. No family history of colon cancer. She is taking MiraLAX and senna as needed with bowel movements every day or every other day. Sometimes she has hard stool but on most days she has formed soft stool. Denies any blood per rectum. No nausea, vomiting, dysphagia, abdominal pain or melena.    Outpatient Encounter Prescriptions as of 10/03/2016  Medication Sig  . ACCU-CHEK AVIVA PLUS test strip USE ONE STRIP TO TEST BEFORE MEALS AND AND AT BEDTIME  . ACCU-CHEK SOFTCLIX LANCETS lancets FOR TEST DOSE BEFORE MEALS AND AT BEDTIME  . acetaminophen (TYLENOL) 325 MG tablet Take 1-2 tablets (325-650 mg total) by mouth every 4 (four) hours as needed for mild pain (available over the counter).  Marland Kitchen amLODipine (NORVASC) 10 MG tablet Take 1 tablet (10 mg total) by mouth daily.  Marland Kitchen aspirin 325 MG tablet Take 325 mg by mouth daily. Reported on 09/25/2015  . atorvastatin (LIPITOR) 20 MG tablet Take 1 tablet (20 mg total) by mouth daily.  . baclofen (LIORESAL) 10 MG tablet TAKE ONE (1) TABLET BY MOUTH TWO (2) TIMES DAILY TO HELP WITH TONE/SPASMS  . cetirizine (ZYRTEC) 10 MG tablet TAKE ONE (1) TABLET BY MOUTH EVERY DAY  . gabapentin (NEURONTIN) 100 MG capsule Take 1 capsule (100 mg total) by mouth 3 (three) times daily.  . hydrochlorothiazide (HYDRODIURIL) 25 MG tablet TAKE ONE (1) TABLET BY MOUTH EVERY DAY  . insulin aspart (NOVOLOG) 100 UNIT/ML injection  0-12 units as per sliding scale  . metoprolol (LOPRESSOR) 100 MG tablet Take 1.5 tablets (150 mg total) by mouth 2 (two) times daily.  Marland Kitchen nystatin (MYCOSTATIN/NYSTOP) powder Apply topically 2 (two) times daily.  . polyethylene glycol powder (GLYCOLAX/MIRALAX) powder Take 17 g by mouth daily as needed (constipation).  . protein supplement shake (PREMIER PROTEIN) LIQD Take 2 oz by mouth daily.  . SENNA LAX 8.6 MG tablet TAKE 1 TABLET BY MOUTH EVERY DAY AS NEEDED FOR CONSTIPATION  . Spacer/Aero-Holding Chambers (E-Z SPACER) inhaler Use as instructed  . UNIFINE PENTIPS 31G X 6 MM MISC USE AS PRESCRIBED BY PHYSICIAN   No facility-administered encounter medications on file as of 10/03/2016.     Allergies as of 10/03/2016  . (No Known Allergies)    Past Medical History:  Diagnosis Date  . Aphasia due to stroke   . Bipolar affective disorder ([place])   . Depression   . Diabetes mellitus ([place])    ?type (05/29/2013)  . Episodic confusion   . 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 Hypertension   . Schizophrenia ([place])    [name] 11/28/2007 (05/29/2013)  . Seizures ([place])   . Stroke [facility]) 05/27/2013    Past Surgical History:  Procedure Laterality Date  . BREAST BIOPSY Right   . CESAREAN SECTION  ~ 26  . TEE WITHOUT CARDIOVERSION N/A 12/08/2013   Procedure: TRANSESOPHAGEAL ECHOCARDIOGRAM (TEE);  Surgeon: Candee Furbish, MD;  Location: [facility] END[facility]COPY;  Service: Cardiovascular;  Laterality: N/A;  . TUBAL LIGATION      Family History  Problem Relation Age of Onset  . Hypertension Mother   . Stroke Father   . Hypertension Father   . Diabetes Father     Social History   Social History  . Marital status: Single    Spouse name: N/A  . Number of children: 3  . Years of education: 12   Occupational History  . disabled    Social History Main Topics  . Smoking status: Never Smoker  . Smokeless tobacco: Never Used  . Alcohol use No  . Drug use: No  . Sexual  activity: Not on file   Other Topics Concern  . Not on file   Social History Narrative   Patient is single with 3 children.   Patient is right handed.   Patient has hs education.   Patient does not drink caffeine.      Review of systems: Review of Systems  Constitutional: Negative for fever and chills.  HENT: Positive for allergies, sinus trouble   Eyes: Negative for blurred vision.  Respiratory: Negative for cough, shortness of breath and wheezing.   Cardiovascular: Negative for chest pain and palpitations.  Gastrointestinal: as per HPI Genitourinary: Negative for dysuria, urgency, frequency and hematuria.  Musculoskeletal: Negative for myalgias, back pain and joint pain.  Skin: Negative for itching and rash.  Neurological: Negative for dizziness, tremors, seizures and loss of consciousness.  positive for headache Endo/Heme/Allergies: Positive for seasonal allergies.  Psychiatric/Behavioral: Negative for depression, suicidal ideas and hallucinations.  All other systems reviewed and are negative.   Physical Exam: Vitals:   10/03/16 0828  BP: (!) 146/86  Pulse: (!) 104   Body mass index is 27.54 kg/m. Gen:      No acute distress, wheelchair bound, right hemiplegic and aphasic HEENT:  EOMI, sclera anicteric Neck:     No masses; no thyromegaly Lungs:    Clear to auscultation bilaterally; normal respiratory effort CV:         Regular rate and rhythm; no murmurs Abd:      + bowel sounds; soft, non-tender; no palpable masses, no distension Ext:    Right lower edema; adequate peripheral perfusion Skin:      Warm and dry; no rash Neuro: alert and oriented x 3 Psych: normal mood and affect  Data Reviewed:  Reviewed labs, radiology imaging, old records and pertinent past GI work up   Assessment and Plan/Recommendations:  56 year old female with history of diabetes, high blood pressure, CVA with right hemiplegia and expressive aphasia here accompanied by her daughter to  discuss colorectal cancer screening Discussed in detail the benefit and risk associated with colonoscopy and also fecal immunochemical testing (Cologaurd); Patient and daughter wanted to proceed with Cologaurd and they understand that if it is positive will need follow-up colonoscopy to further evaluate  Constipation: Increase dietary fluid and fiber intake Start Amitiza 8 g twice daily  Return as needed   K. Denzil Magnuson , MD [phone] Mon-Fri 8a-5p [phone] after 5p, weekends, holidays  CC: Bartholome Bill, MD

## 2016-10-05 ENCOUNTER — Other Ambulatory Visit: Payer: Self-pay | Admitting: Internal Medicine

## 2016-10-09 ENCOUNTER — Telehealth: Payer: Self-pay | Admitting: Gastroenterology

## 2016-10-09 NOTE — Telephone Encounter (Signed)
Spoke with the daughter [name]. She would need her mother's procedure to be done on a Tuesday or a Thursday due to her school schedule. Presently there are no available dates at the [facility].

## 2016-10-15 NOTE — Telephone Encounter (Signed)
Ok, please have patient on cancellation list and schedule her for next available appt at [facility] on Tuesday. Thanks

## 2016-10-26 ENCOUNTER — Other Ambulatory Visit: Payer: Self-pay | Admitting: Family Medicine

## 2016-10-31 ENCOUNTER — Ambulatory Visit (INDEPENDENT_AMBULATORY_CARE_PROVIDER_SITE_OTHER): Payer: Medicaid Other | Admitting: Sports Medicine

## 2016-10-31 DIAGNOSIS — B351 Tinea unguium: Secondary | ICD-10-CM

## 2016-10-31 DIAGNOSIS — M79675 Pain in left toe(s): Secondary | ICD-10-CM

## 2016-10-31 DIAGNOSIS — M79676 Pain in unspecified toe(s): Secondary | ICD-10-CM

## 2016-10-31 DIAGNOSIS — R600 Localized edema: Secondary | ICD-10-CM

## 2016-10-31 DIAGNOSIS — M79674 Pain in right toe(s): Secondary | ICD-10-CM

## 2016-10-31 DIAGNOSIS — E1142 Type 2 diabetes mellitus with diabetic polyneuropathy: Secondary | ICD-10-CM

## 2016-10-31 NOTE — Progress Notes (Signed)
Patient ID: [name], female   DOB: 29-Nov-1960, 56 y.o.   MRN: [id number]   Subjective: [name] is a 56 y.o. female patient with history of type 2 diabetes who presents to office today complaining of long, painful nails; unable to trim. Patient states that the glucose reading this morning was good. Patient is assisted by daughter who helps to report this since patient can not clearly talk since after stroke. Patient's daughter states that it is difficult for her mom to find appropriate fitting shoes because the swelling that she has on the right and reports that her mom could not fit into the compression stockings. She also reports that her primary care doctor stated that the lymphedema pump would not be approved for her right lower extremity. Daughter denies any other acute problems or issues.  Patient Active Problem List   Diagnosis Date Noted  . Seizures ([place]) 09/29/2015  . Urine leukocytes 01/08/2015  . Expressive aphasia 01/08/2015  . Malnutrition of moderate degree ([place]) 11/11/2014  . Syncope 11/08/2014  . Dehydration 11/08/2014  . Staring spell 07/09/2014  . Adhesive capsulitis of right shoulder 06/04/2014  . Palpitations 04/02/2014  . Essential hypertension 01/05/2014  . DM (diabetes mellitus) ([place]) 01/05/2014  . Paranoid schizophrenia ([place]) 12/28/2013  . Cerebral embolism with cerebral infarction ([place]) 12/05/2013  . Sinus tachycardia 12/05/2013  . 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 Spastic hemiplegia affecting dominant side ([place]) 09/12/2013  . CVA (cerebral infarction) 06/04/2013  . Dysphagia 06/02/2013  . Benign polycythemia 06/02/2013  . Malignant hypertension 05/28/2013  . DM (diabetes mellitus) type II controlled, neurological manifestation ([place]) 05/27/2013  . Acute embolic stroke ([place]) 42/35/3614  . Aphasia 05/27/2013  . Facial droop due to stroke 05/27/2013   . Hypertensive emergency 05/27/2013  . Stroke ([place]) 05/27/2013  . Aphasia complicating stroke 43/15/4008   Current Outpatient Prescriptions on File Prior to Visit  Medication Sig Dispense Refill  . ACCU-CHEK AVIVA PLUS test strip USE ONE STRIP TO TEST BEFORE MEALS AND AND AT BEDTIME 100 each 5  . ACCU-CHEK SOFTCLIX LANCETS lancets FOR TEST DOSE BEFORE MEALS AND AT BEDTIME 100 each 5  . acetaminophen (TYLENOL) 325 MG tablet Take 1-2 tablets (325-650 mg total) by mouth every 4 (four) hours as needed for mild pain (available over the counter).    Marland Kitchen amLODipine (NORVASC) 10 MG tablet Take 1 tablet (10 mg total) by mouth daily. 30 tablet 5  . aspirin 325 MG tablet Take 325 mg by mouth daily. Reported on 09/25/2015    . atorvastatin (LIPITOR) 20 MG tablet Take 1 tablet (20 mg total) by mouth daily. 30 tablet 5  . baclofen (LIORESAL) 10 MG tablet TAKE ONE (1) TABLET BY MOUTH TWO (2) TIMES DAILY TO HELP WITH TONE/SPASMS 60 each 2  . cetirizine (ZYRTEC) 10 MG tablet TAKE ONE (1) TABLET BY MOUTH EVERY DAY 30 tablet 3  . gabapentin (NEURONTIN) 100 MG capsule Take 1 capsule (100 mg total) by mouth 3 (three) times daily. 90 capsule 5  . hydrochlorothiazide (HYDRODIURIL) 25 MG tablet TAKE ONE (1) TABLET BY MOUTH EVERY DAY 30 tablet 5  . insulin aspart (NOVOLOG) 100 UNIT/ML injection 0-12 units as per sliding scale 10 mL 3  . lubiprostone (AMITIZA) 8 MCG capsule Take 1 capsule (8 mcg total) by mouth 2 (two) times daily with a meal. 60 capsule 11  . metoprolol (LOPRESSOR) 100 MG tablet Take 1.5 tablets (150  mg total) by mouth 2 (two) times daily. 90 tablet 5  . nystatin (MYCOSTATIN/NYSTOP) powder Apply topically 2 (two) times daily. 45 g 0  . polyethylene glycol powder (GLYCOLAX/MIRALAX) powder Take 17 g by mouth daily as needed (constipation). 527 g 3  . protein supplement shake (PREMIER PROTEIN) LIQD Take 2 oz by mouth daily.    . SENNA LAX 8.6 MG tablet TAKE 1 TABLET BY MOUTH EVERY DAY AS NEEDED FOR  CONSTIPATION 30 tablet 2  . Spacer/Aero-Holding Chambers (E-Z SPACER) inhaler Use as instructed 1 each 2  . UNIFINE PENTIPS 31G X 6 MM MISC USE AS PRESCRIBED BY PHYSICIAN 100 each 11   No current facility-administered medications on file prior to visit.    No Known Allergies   Objective: General: Patient is awake, alert, and oriented x 2 and in no acute distress.  Integument: Skin is warm, dry and supple bilateral. Nails are tender, long, thickened and dystrophic with subungual debris, consistent with onychomycosis, 1-5 bilateral. No interdigital maceration bilateral, No signs of infection. No open lesions or preulcerative lesions present bilateral. Remaining integument unremarkable.  Vasculature:  Dorsalis Pedis pulse 0/4 bilateral. Posterior Tibial pulse  1/4 bilateral. Capillary fill time <3 sec 1-5 bilateral. No hair growth to the level of the digits.Temperature gradient within normal limits. No varicosities present bilateral. +2 pitting edema present bilateral R>L secondary to stroke.   Neurology: The patient has diminished sensation measured with a 5.07/10g Semmes Weinstein Monofilament at toes bilateral . Vibratory sensation diminished bilateral with tuning fork. No Babinski sign present bilateral.   Musculoskeletal: No gross pedal deformities noted bilateral. Muscular strength 4/5 in all lower extremity muscular groups on left, 3/5 on right without pain on range of motion; Stroke with right sided weakness. No tenderness with calf compression bilateral.  Assessment and Plan: Problem List Items Addressed This Visit    None    Visit Diagnoses    Dermatophytosis of nail    -  Primary   Pain in toes of both feet       Diabetic polyneuropathy associated with type 2 diabetes mellitus (HCC)       Localized edema         -Examined patient. -Discussed and educated patient on diabetic foot care, especially with  regards to the vascular, neurological and musculoskeletal systems.   -Stressed the importance of good glycemic control and the detriment of not  controlling glucose levels in relation to the foot. -Mechanically debrided all nails 1-5 bilateral using sterile nail nipper and filed with dremel without incident  -Cont with good hygiene Habits -Edema pumps not approved -Discussed with daughter diabetic shoe [employer], since patient is Medicaid and shoes are not covered for Medicaid patients; daughter declined diabetic shoes at today's visit, due to cost -Answered all patient and daughters questions -Patient to return in 3 months for at risk foot care -Patient advised to call the office if any problems or questions arise in the meantime.  Landis Martins, DPM

## 2016-11-01 ENCOUNTER — Other Ambulatory Visit: Payer: Self-pay | Admitting: Family Medicine

## 2016-11-11 ENCOUNTER — Emergency Department (HOSPITAL_COMMUNITY)
Admission: EM | Admit: 2016-11-11 | Discharge: 2016-11-11 | Disposition: A | Discharge status: Home / Self Care | Payer: Medicaid Other | Attending: Emergency Medicine | Admitting: Emergency Medicine

## 2016-11-11 ENCOUNTER — Encounter (HOSPITAL_COMMUNITY): Payer: Self-pay | Admitting: Emergency Medicine

## 2016-11-11 DIAGNOSIS — H9201 Otalgia, right ear: Secondary | ICD-10-CM | POA: Diagnosis present

## 2016-11-11 DIAGNOSIS — I10 Essential (primary) hypertension: Secondary | ICD-10-CM | POA: Insufficient documentation

## 2016-11-11 DIAGNOSIS — Z794 Long term (current) use of insulin: Secondary | ICD-10-CM | POA: Diagnosis not present

## 2016-11-11 DIAGNOSIS — E1149 Type 2 diabetes mellitus with other diabetic neurological complication: Secondary | ICD-10-CM | POA: Insufficient documentation

## 2016-11-11 DIAGNOSIS — Z79899 Other long term (current) drug therapy: Secondary | ICD-10-CM | POA: Diagnosis not present

## 2016-11-11 DIAGNOSIS — J069 Acute upper respiratory infection, unspecified: Secondary | ICD-10-CM | POA: Insufficient documentation

## 2016-11-11 DIAGNOSIS — Z7982 Long term (current) use of aspirin: Secondary | ICD-10-CM | POA: Insufficient documentation

## 2016-11-11 MED ORDER — BENZONATATE 100 MG PO CAPS: 100.0000 mg | ORAL_CAPSULE | Freq: Three times a day (TID) | ORAL | 0 refills | Status: DC

## 2016-11-11 MED ORDER — IBUPROFEN 400 MG PO TABS
400.0000 mg | ORAL_TABLET | Freq: Four times a day (QID) | ORAL | 0 refills | Status: DC | PRN
Start: 2016-11-11 — End: 2021-01-06

## 2016-11-11 NOTE — ED Provider Notes (Signed)
[facility] DEPT Provider Note   CSN: [id number] Arrival date & time: 11/11/16  0900     History   Chief Complaint Chief Complaint  Patient presents with  . Otalgia    HPI [name] is a 56 y.o. female.  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 Her caretaker states that she has been pulling at her right ear for the past week. She also has dry cough as well as sneezing and rhinorrhea. She has not tried any medications for pain or URI symptoms. She denies any drainage from ear, fevers, chest pain, trouble breathing, trouble swallowing.  Past Medical History:  Diagnosis Date  . Aphasia due to stroke   . Bipolar affective disorder ([place])   . Depression   . Diabetes mellitus ([place])    ?type (05/29/2013)  . Episodic confusion   . 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 Hypertension   . Schizophrenia ([place])    [name] 11/28/2007 (05/29/2013)  . Seizures ([place])   . Stroke [facility]) 05/27/2013    Patient Active Problem List   Diagnosis Date Noted  . Seizures ([place]) 09/29/2015  . Urine leukocytes 01/08/2015  . Expressive aphasia 01/08/2015  . Malnutrition of moderate degree ([place]) 11/11/2014  . Syncope 11/08/2014  . Dehydration 11/08/2014  . Staring spell 07/09/2014  . Adhesive capsulitis of right shoulder 06/04/2014  . Palpitations 04/02/2014  . Essential hypertension 01/05/2014  . DM (diabetes mellitus) ([place]) 01/05/2014  . Paranoid schizophrenia ([place]) 12/28/2013  . Cerebral embolism with cerebral infarction ([place]) 12/05/2013  . Sinus tachycardia 12/05/2013  . 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 Spastic hemiplegia affecting dominant side ([place]) 09/12/2013  . CVA (cerebral infarction) 06/04/2013  .  Dysphagia 06/02/2013  . Benign polycythemia 06/02/2013  . Malignant hypertension 05/28/2013  . DM (diabetes mellitus) type II controlled, neurological manifestation ([place]) 05/27/2013  . Acute embolic stroke ([place]) 42/59/5638  . Aphasia 05/27/2013  . 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 Hypertensive emergency 05/27/2013  . Stroke ([place]) 05/27/2013  . Aphasia complicating stroke 75/64/3329    Past Surgical History:  Procedure Laterality Date  . BREAST BIOPSY Right   . CESAREAN SECTION  ~ 46  . TEE WITHOUT CARDIOVERSION N/A 12/08/2013   Procedure: TRANSESOPHAGEAL ECHOCARDIOGRAM (TEE);  Surgeon: Candee Furbish, MD;  Location: [facility] ENDOSCOPY;  Service: Cardiovascular;  Laterality: N/A;  . TUBAL LIGATION      OB History    No data available       Home Medications    Prior to Admission medications   Medication Sig Start Date End Date Taking? Authorizing Provider  ACCU-CHEK AVIVA PLUS test strip USE ONE STRIP TO TEST BEFORE MEALS AND AND AT BEDTIME 03/21/16   Jegede, Marlena Clipper, MD  ACCU-CHEK SOFTCLIX LANCETS lancets FOR TEST DOSE BEFORE MEALS AND AT BEDTIME 04/24/16   Arnoldo Morale, MD  acetaminophen (TYLENOL) 325 MG tablet Take 1-2 tablets (325-650 mg total) by mouth every 4 (four) hours as needed for mild pain (available over the counter). 07/08/13   Love, Ivan Anchors, PA-C  amLODipine (NORVASC) 10 MG tablet Take 1 tablet (10 mg total) by mouth daily. 01/20/16   Arnoldo Morale, MD  aspirin 325 MG tablet Take 325 mg by mouth daily. Reported on 09/25/2015  [provider]  atorvastatin (LIPITOR) 20 MG tablet Take 1 tablet (20 mg total) by mouth daily. 01/20/16   Arnoldo Morale, MD  baclofen (LIORESAL) 10 MG tablet TAKE ONE (1) TABLET BY MOUTH TWO (2) TIMES DAILY TO HELP WITH TONE/SPASMS 04/25/16   Arnoldo Morale, MD  benzonatate (TESSALON) 100 MG capsule Take 1 capsule (100 mg total) by mouth every 8 (eight) hours. 11/11/16   Laina Guerrieri, PA-C  cetirizine (ZYRTEC) 10 MG tablet TAKE ONE (1)  TABLET BY MOUTH EVERY DAY 06/23/16   Arnoldo Morale, MD  gabapentin (NEURONTIN) 100 MG capsule Take 1 capsule (100 mg total) by mouth 3 (three) times daily. 01/20/16   Arnoldo Morale, MD  hydrochlorothiazide (HYDRODIURIL) 25 MG tablet TAKE ONE (1) TABLET BY MOUTH EVERY DAY 01/20/16   Arnoldo Morale, MD  ibuprofen (ADVIL,MOTRIN) 400 MG tablet Take 1 tablet (400 mg total) by mouth every 6 (six) hours as needed. 11/11/16   Marq Rebello, PA-C  insulin aspart (NOVOLOG) 100 UNIT/ML injection 0-12 units as per sliding scale 04/25/16   Arnoldo Morale, MD  lubiprostone (AMITIZA) 8 MCG capsule Take 1 capsule (8 mcg total) by mouth 2 (two) times daily with a meal. 10/03/16   Nandigam, Venia Minks, MD  metoprolol (LOPRESSOR) 100 MG tablet Take 1.5 tablets (150 mg total) by mouth 2 (two) times daily. 01/20/16   Arnoldo Morale, MD  nystatin (MYCOSTATIN/NYSTOP) powder Apply topically 2 (two) times daily. 06/24/16   Sherlene Shams, MD  polyethylene glycol powder (GLYCOLAX/MIRALAX) powder Take 17 g by mouth daily as needed (constipation). 02/22/15   Lance Bosch, NP  protein supplement shake (PREMIER PROTEIN) LIQD Take 2 oz by mouth daily.    [provider]  SENNA LAX 8.6 MG tablet TAKE 1 TABLET BY MOUTH EVERY DAY AS NEEDED FOR CONSTIPATION 06/18/15   Lance Bosch, NP  Spacer/Aero-Holding Chambers (E-Z SPACER) inhaler Use as instructed 06/18/15   Lance Bosch, NP  UNIFINE PENTIPS 31G X 6 MM MISC USE AS PRESCRIBED BY PHYSICIAN 03/16/15   Lance Bosch, NP    Family History Family History  Problem Relation Age of Onset  . Hypertension Mother   . Stroke Father   . Hypertension Father   . Diabetes Father     Social History Social History  Substance Use Topics  . Smoking status: Never Smoker  . Smokeless tobacco: Never Used  . Alcohol use No     Allergies   Patient has no known allergies.   Review of Systems Review of Systems  HENT: Positive for congestion, ear pain, rhinorrhea and sneezing.  Negative for ear discharge, facial swelling, hearing loss and sore throat.   Eyes: Negative for pain and itching.  Respiratory: Positive for cough. Negative for shortness of breath.   Cardiovascular: Negative for chest pain.     Physical Exam Updated Vital Signs BP (!) 152/86 (BP Location: Left Arm)   Pulse 79   Temp 98.5 F (36.9 C) (Oral)   Resp 16   Ht 5\' 5"  (1.651 m)   Wt 72.6 kg (160 lb)   SpO2 100%   BMI 26.63 kg/m   Physical Exam  Constitutional: She appears well-developed and well-nourished. No distress.  HENT:  Head: Normocephalic and atraumatic.  Right Ear: No drainage. No mastoid tenderness. Tympanic membrane is not erythematous, not retracted and not bulging. A middle ear effusion is present.  Left Ear: Tympanic membrane normal.  Nose: Mucosal edema present.  Mouth/Throat: Uvula is midline, oropharynx is clear  and moist and mucous membranes are normal.  No external drainage noted on bilateral ears. No mastoid or tragus tenderness bilaterally.  Eyes: Conjunctivae and EOM are normal. No scleral icterus.  Neck: Normal range of motion.  Pulmonary/Chest: Effort normal. No respiratory distress.  Neurological: She is alert.  Skin: No rash noted. She is not diaphoretic.  Psychiatric: She has a normal mood and affect.  Nursing note and vitals reviewed.    ED Treatments / Results  Labs (all labs ordered are listed, but only abnormal results are displayed) Labs Reviewed - No data to display  EKG  EKG Interpretation None       Radiology No results found.  Procedures Procedures (including critical care time)  Medications Ordered in ED Medications - No data to display   Initial Impression / Assessment and Plan / ED Course  I have reviewed the triage vital signs and the nursing notes.  Pertinent labs & imaging results that were available during my care of the patient were reviewed by me and considered in my medical decision making (see chart for  details).     Patient presents to ED for evaluation of right-sided ear pain as well as dry cough, rhinorrhea and sneezing for the past week. They have not tried any medications to help with symptoms or pain. She is afebrile with no history of fever. On physical exam right ear has fluid behind TM but no erythema, retraction or bulging concerning for infection. She denies any chest pain or trouble breathing. She denies sore throat. I suspect symptoms could be due to a viral URI. We will give Tessalon for symptomatic relief as well as ibuprofen to help with ear pain. Patient encouraged to follow-up with PCP for further evaluation. Patient appears stable for discharge at this time. Strict return precautions given.  Final Clinical Impressions(s) / ED Diagnoses   Final diagnoses:  Right ear pain  Viral upper respiratory tract infection    New Prescriptions New Prescriptions   BENZONATATE (TESSALON) 100 MG CAPSULE    Take 1 capsule (100 mg total) by mouth every 8 (eight) hours.   IBUPROFEN (ADVIL,MOTRIN) 400 MG TABLET    Take 1 tablet (400 mg total) by mouth every 6 (six) hours as needed.     Delia Heady, PA-C 11/11/16 1055    Duffy Bruce, MD 11/13/16 [phone]

## 2016-11-11 NOTE — Discharge Instructions (Signed)
Please read attached information regarding your condition. Take Tessalon [place] as needed for cough. Take ibuprofen as needed for ear pain. Follow-up with PCP for further evaluation. Return to ED for worsening pain, hearing changes, high fevers with productive cough, chest pain or trouble breathing.

## 2016-11-11 NOTE — ED Triage Notes (Signed)
Earache started 2 days ago-- unrelieved wityh OTC meds

## 2016-11-28 ENCOUNTER — Other Ambulatory Visit: Payer: Self-pay | Admitting: Internal Medicine

## 2017-01-30 ENCOUNTER — Encounter: Payer: Self-pay | Admitting: Sports Medicine

## 2017-01-30 ENCOUNTER — Ambulatory Visit: Payer: Medicaid Other | Admitting: Sports Medicine

## 2017-01-30 DIAGNOSIS — B351 Tinea unguium: Secondary | ICD-10-CM

## 2017-01-30 DIAGNOSIS — E1142 Type 2 diabetes mellitus with diabetic polyneuropathy: Secondary | ICD-10-CM

## 2017-01-30 DIAGNOSIS — M79675 Pain in left toe(s): Secondary | ICD-10-CM

## 2017-01-30 DIAGNOSIS — M79676 Pain in unspecified toe(s): Secondary | ICD-10-CM | POA: Diagnosis not present

## 2017-01-30 DIAGNOSIS — R600 Localized edema: Secondary | ICD-10-CM

## 2017-01-30 DIAGNOSIS — Z8673 Personal history of transient ischemic attack (TIA), and cerebral infarction without residual deficits: Secondary | ICD-10-CM

## 2017-01-30 DIAGNOSIS — M79674 Pain in right toe(s): Secondary | ICD-10-CM

## 2017-01-30 NOTE — Progress Notes (Signed)
Patient ID: [name], female   DOB: May 25, 1960, 56 y.o.   MRN: [id number]   Subjective: [name] is a 56 y.o. female patient with history of type 2 diabetes who presents to office today complaining of long, painful nails; unable to trim. Patient states that the glucose reading this morning was 202. Patient is assisted by daughter who helps to report this since patient can not clearly talk since after stroke. Daughter denies any other acute problems or issues.  Patient Active Problem List   Diagnosis Date Noted  . Seizures ([place]) 09/29/2015  . Urine leukocytes 01/08/2015  . Expressive aphasia 01/08/2015  . Malnutrition of moderate degree ([place]) 11/11/2014  . Syncope 11/08/2014  . Dehydration 11/08/2014  . Staring spell 07/09/2014  . Adhesive capsulitis of right shoulder 06/04/2014  . Palpitations 04/02/2014  . Essential hypertension 01/05/2014  . DM (diabetes mellitus) ([place]) 01/05/2014  . Paranoid schizophrenia ([place]) 12/28/2013  . Cerebral embolism with cerebral infarction ([place]) 12/05/2013  . Sinus tachycardia 12/05/2013  . 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 Spastic hemiplegia affecting dominant side ([place]) 09/12/2013  . CVA (cerebral infarction) 06/04/2013  . Dysphagia 06/02/2013  . Benign polycythemia 06/02/2013  . Malignant hypertension 05/28/2013  . DM (diabetes mellitus) type II controlled, neurological manifestation ([place]) 05/27/2013  . Acute embolic stroke ([place]) 94/70/9628  . Aphasia 05/27/2013  . 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 Hypertensive emergency 05/27/2013  . Stroke ([place]) 05/27/2013  . Aphasia complicating stroke 36/62/9476   Current Outpatient Medications on File Prior to Visit  Medication Sig Dispense Refill  . ACCU-CHEK AVIVA PLUS test strip USE ONE STRIP TO TEST BEFORE MEALS AND AND AT BEDTIME 100 each 5  . ACCU-CHEK SOFTCLIX LANCETS lancets  FOR TEST DOSE BEFORE MEALS AND AT BEDTIME 100 each 5  . acetaminophen (TYLENOL) 325 MG tablet Take 1-2 tablets (325-650 mg total) by mouth every 4 (four) hours as needed for mild pain (available over the counter).    Marland Kitchen amLODipine (NORVASC) 10 MG tablet Take 1 tablet (10 mg total) by mouth daily. 30 tablet 5  . aspirin 325 MG tablet Take 325 mg by mouth daily. Reported on 09/25/2015    . atorvastatin (LIPITOR) 20 MG tablet Take 1 tablet (20 mg total) by mouth daily. 30 tablet 5  . baclofen (LIORESAL) 10 MG tablet TAKE ONE (1) TABLET BY MOUTH TWO (2) TIMES DAILY TO HELP WITH TONE/SPASMS 60 each 2  . benzonatate (TESSALON) 100 MG capsule Take 1 capsule (100 mg total) by mouth every 8 (eight) hours. 21 capsule 0  . cetirizine (ZYRTEC) 10 MG tablet TAKE ONE (1) TABLET BY MOUTH EVERY DAY 30 tablet 3  . gabapentin (NEURONTIN) 100 MG capsule Take 1 capsule (100 mg total) by mouth 3 (three) times daily. 90 capsule 5  . hydrochlorothiazide (HYDRODIURIL) 25 MG tablet TAKE ONE (1) TABLET BY MOUTH EVERY DAY 30 tablet 5  . ibuprofen (ADVIL,MOTRIN) 400 MG tablet Take 1 tablet (400 mg total) by mouth every 6 (six) hours as needed. 30 tablet 0  . insulin aspart (NOVOLOG) 100 UNIT/ML injection 0-12 units as per sliding scale 10 mL 3  . lubiprostone (AMITIZA) 8 MCG capsule Take 1 capsule (8 mcg total) by mouth 2 (two) times daily with a meal. 60 capsule 11  . metoprolol (LOPRESSOR) 100 MG tablet Take 1.5 tablets (150 mg total) by mouth 2 (two) times daily. 90 tablet 5  .  nystatin (MYCOSTATIN/NYSTOP) powder Apply topically 2 (two) times daily. 45 g 0  . polyethylene glycol powder (GLYCOLAX/MIRALAX) powder Take 17 g by mouth daily as needed (constipation). 527 g 3  . protein supplement shake (PREMIER PROTEIN) LIQD Take 2 oz by mouth daily.    . SENNA LAX 8.6 MG tablet TAKE 1 TABLET BY MOUTH EVERY DAY AS NEEDED FOR CONSTIPATION 30 tablet 2  . Spacer/Aero-Holding Chambers (E-Z SPACER) inhaler Use as instructed 1 each 2  .  UNIFINE PENTIPS 31G X 6 MM MISC USE AS PRESCRIBED BY PHYSICIAN 100 each 11   No current facility-administered medications on file prior to visit.    No Known Allergies   Objective: General: Patient is awake, alert, and oriented x 2 and in no acute distress.  Integument: Skin is warm, dry and supple bilateral. Nails are tender, long, thickened and dystrophic with subungual debris, consistent with onychomycosis, 1-5 bilateral. No interdigital maceration bilateral, No signs of infection. No open lesions or preulcerative lesions present bilateral. Remaining integument unremarkable.  Vasculature:  Dorsalis Pedis pulse 0/4 bilateral. Posterior Tibial pulse  1/4 bilateral. Capillary fill time <3 sec 1-5 bilateral. No hair growth to the level of the digits.Temperature gradient within normal limits. No varicosities present bilateral. +2 pitting edema present bilateral R>L secondary to stroke.   Neurology: The patient has diminished sensation measured with a 5.07/10g Semmes Weinstein Monofilament at toes bilateral . Vibratory sensation diminished bilateral with tuning fork. No Babinski sign present bilateral.   Musculoskeletal: No gross pedal deformities noted bilateral. Muscular strength 4/5 in all lower extremity muscular groups on left, 3/5 on right without pain on range of motion; Stroke with right sided weakness. No tenderness with calf compression bilateral.  Assessment and Plan: Problem List Items Addressed This Visit    None    Visit Diagnoses    Dermatophytosis of nail    -  Primary   Pain in toes of both feet       Diabetic polyneuropathy associated with type 2 diabetes mellitus (HCC)       Localized edema       History of stroke         -Examined patient. -Discussed and educated patient on diabetic foot care, especially with  regards to the vascular, neurological and musculoskeletal systems.  -Stressed the importance of good glycemic control and the detriment of not  controlling  glucose levels in relation to the foot. -Mechanically debrided all nails 1-5 bilateral using sterile nail nipper and filed with dremel without incident  -Cont with good hygiene Habits -Answered all patient and daughters questions -Patient to return in 3 months for at risk foot care -Patient advised to call the office if any problems or questions arise in the meantime.  Landis Martins, DPM

## 2017-04-30 ENCOUNTER — Other Ambulatory Visit: Payer: Self-pay | Admitting: Family Medicine

## 2017-04-30 DIAGNOSIS — Z1231 Encounter for screening mammogram for malignant neoplasm of breast: Secondary | ICD-10-CM

## 2017-05-08 ENCOUNTER — Encounter: Payer: Self-pay | Admitting: Sports Medicine

## 2017-05-08 ENCOUNTER — Ambulatory Visit: Payer: Medicaid Other | Admitting: Sports Medicine

## 2017-05-08 DIAGNOSIS — E1142 Type 2 diabetes mellitus with diabetic polyneuropathy: Secondary | ICD-10-CM | POA: Diagnosis not present

## 2017-05-08 DIAGNOSIS — R600 Localized edema: Secondary | ICD-10-CM

## 2017-05-08 DIAGNOSIS — M79676 Pain in unspecified toe(s): Secondary | ICD-10-CM

## 2017-05-08 DIAGNOSIS — B351 Tinea unguium: Secondary | ICD-10-CM

## 2017-05-08 DIAGNOSIS — M79674 Pain in right toe(s): Secondary | ICD-10-CM

## 2017-05-08 DIAGNOSIS — Z8673 Personal history of transient ischemic attack (TIA), and cerebral infarction without residual deficits: Secondary | ICD-10-CM

## 2017-05-08 DIAGNOSIS — M79675 Pain in left toe(s): Secondary | ICD-10-CM

## 2017-05-08 NOTE — Progress Notes (Signed)
Patient ID: [name], female   DOB: [birth year], 57 y.o.   MRN: [id number]   Subjective: [name] is a 57 y.o. female patient with history of type 2 diabetes who presents to office today complaining of long, painful nails; unable to trim. Patient states that the glucose reading this morning was 243. Patient is assisted by daughter who helps to report this since patient can not clearly talk since after stroke. Reports that they found shoes that work. Daughter denies any other acute problems or issues.  Patient Active Problem List   Diagnosis Date Noted  . Seizures ([place]) 09/29/2015  . Urine leukocytes 01/08/2015  . Expressive aphasia 01/08/2015  . Malnutrition of moderate degree ([place]) 11/11/2014  . Syncope 11/08/2014  . Dehydration 11/08/2014  . Staring spell 07/09/2014  . Adhesive capsulitis of right shoulder 06/04/2014  . Palpitations 04/02/2014  . Essential hypertension 01/05/2014  . DM (diabetes mellitus) ([place]) 01/05/2014  . Paranoid schizophrenia ([place]) 12/28/2013  . Cerebral embolism with cerebral infarction ([place]) 12/05/2013  . Sinus tachycardia 12/05/2013  . 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 Spastic hemiplegia affecting dominant side ([place]) 09/12/2013  . CVA (cerebral infarction) 06/04/2013  . Dysphagia 06/02/2013  . Benign polycythemia 06/02/2013  . Malignant hypertension 05/28/2013  . DM (diabetes mellitus) type II controlled, neurological manifestation ([place]) 05/27/2013  . Acute embolic stroke ([place]) 98/92/1194  . Aphasia 05/27/2013  . 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 Hypertensive emergency 05/27/2013  . Stroke ([place]) 05/27/2013  . Aphasia complicating stroke 17/40/8144   Current Outpatient Medications on File Prior to Visit  Medication Sig Dispense Refill  . ACCU-CHEK AVIVA PLUS test strip USE ONE STRIP TO TEST BEFORE MEALS AND AND AT BEDTIME 100 each 5   . ACCU-CHEK SOFTCLIX LANCETS lancets FOR TEST DOSE BEFORE MEALS AND AT BEDTIME 100 each 5  . acetaminophen (TYLENOL) 325 MG tablet Take 1-2 tablets (325-650 mg total) by mouth every 4 (four) hours as needed for mild pain (available over the counter).    Marland Kitchen amLODipine (NORVASC) 10 MG tablet Take 1 tablet (10 mg total) by mouth daily. 30 tablet 5  . aspirin 325 MG tablet Take 325 mg by mouth daily. Reported on 09/25/2015    . atorvastatin (LIPITOR) 20 MG tablet Take 1 tablet (20 mg total) by mouth daily. 30 tablet 5  . baclofen (LIORESAL) 10 MG tablet TAKE ONE (1) TABLET BY MOUTH TWO (2) TIMES DAILY TO HELP WITH TONE/SPASMS 60 each 2  . benzonatate (TESSALON) 100 MG capsule Take 1 capsule (100 mg total) by mouth every 8 (eight) hours. 21 capsule 0  . cetirizine (ZYRTEC) 10 MG tablet TAKE ONE (1) TABLET BY MOUTH EVERY DAY 30 tablet 3  . gabapentin (NEURONTIN) 100 MG capsule Take 1 capsule (100 mg total) by mouth 3 (three) times daily. 90 capsule 5  . hydrochlorothiazide (HYDRODIURIL) 25 MG tablet TAKE ONE (1) TABLET BY MOUTH EVERY DAY 30 tablet 5  . ibuprofen (ADVIL,MOTRIN) 400 MG tablet Take 1 tablet (400 mg total) by mouth every 6 (six) hours as needed. 30 tablet 0  . insulin aspart (NOVOLOG) 100 UNIT/ML injection 0-12 units as per sliding scale 10 mL 3  . lubiprostone (AMITIZA) 8 MCG capsule Take 1 capsule (8 mcg total) by mouth 2 (two) times daily with a meal. 60 capsule 11  . metoprolol (LOPRESSOR) 100 MG tablet Take 1.5 tablets (150 mg total) by mouth 2 (  two) times daily. 90 tablet 5  . nystatin (MYCOSTATIN/NYSTOP) powder Apply topically 2 (two) times daily. 45 g 0  . polyethylene glycol powder (GLYCOLAX/MIRALAX) powder Take 17 g by mouth daily as needed (constipation). 527 g 3  . protein supplement shake (PREMIER PROTEIN) LIQD Take 2 oz by mouth daily.    . SENNA LAX 8.6 MG tablet TAKE 1 TABLET BY MOUTH EVERY DAY AS NEEDED FOR CONSTIPATION 30 tablet 2  . Spacer/Aero-Holding Chambers (E-Z SPACER)  inhaler Use as instructed 1 each 2  . UNIFINE PENTIPS 31G X 6 MM MISC USE AS PRESCRIBED BY PHYSICIAN 100 each 11   No current facility-administered medications on file prior to visit.    No Known Allergies   Objective: General: Patient is awake, alert, and oriented x 2 and in no acute distress.  Integument: Skin is warm, dry and supple bilateral. Nails are tender, long, thickened and dystrophic with subungual debris, consistent with onychomycosis, 1-5 bilateral. No interdigital maceration bilateral, No signs of infection. No open lesions or preulcerative lesions present bilateral. Remaining integument unremarkable.  Vasculature:  Dorsalis Pedis pulse 0/4 bilateral. Posterior Tibial pulse  1/4 bilateral. Capillary fill time <3 sec 1-5 bilateral. No hair growth to the level of the digits.Temperature gradient within normal limits. No varicosities present bilateral. +2 pitting edema present bilateral R>L secondary to stroke.   Neurology: The patient has diminished sensation measured with a 5.07/10g Semmes Weinstein Monofilament at toes bilateral . Vibratory sensation diminished bilateral with tuning fork. No Babinski sign present bilateral.   Musculoskeletal: No gross pedal deformities noted bilateral. Muscular strength 4/5 in all lower extremity muscular groups on left, 3/5 on right without pain on range of motion; Stroke with right sided weakness. No tenderness with calf compression bilateral.  Assessment and Plan: Problem List Items Addressed This Visit    None    Visit Diagnoses    Dermatophytosis of nail    -  Primary   Pain in toes of both feet       Diabetic polyneuropathy associated with type 2 diabetes mellitus (HCC)       Localized edema       History of stroke         -Examined patient. -Discussed and educated patient on diabetic foot care, especially with  regards to the vascular, neurological and musculoskeletal systems.  -Stressed the importance of good glycemic control  and the detriment of not  controlling glucose levels in relation to the foot. -Mechanically debrided all nails 1-5 bilateral using sterile nail nipper and filed with dremel without incident  -Cont with good hygiene Habits -Answered all patient and daughters questions -Patient to return in 2.5 months for at risk foot care -Patient advised to call the office if any problems or questions arise in the meantime.  Landis Martins, DPM

## 2017-06-05 ENCOUNTER — Ambulatory Visit
Admission: RE | Admit: 2017-06-05 | Discharge: 2017-06-05 | Disposition: A | Discharge status: Home / Self Care | Payer: Medicaid Other | Source: Ambulatory Visit | Attending: Family Medicine | Admitting: Family Medicine

## 2017-06-05 DIAGNOSIS — Z1231 Encounter for screening mammogram for malignant neoplasm of breast: Secondary | ICD-10-CM

## 2017-06-12 ENCOUNTER — Other Ambulatory Visit: Payer: Self-pay | Admitting: Family Medicine

## 2017-06-12 DIAGNOSIS — I69391 Dysphagia following cerebral infarction: Secondary | ICD-10-CM

## 2017-06-12 DIAGNOSIS — I69359 Hemiplegia and hemiparesis following cerebral infarction affecting unspecified side: Secondary | ICD-10-CM

## 2017-06-12 DIAGNOSIS — I69320 Aphasia following cerebral infarction: Secondary | ICD-10-CM

## 2017-06-12 DIAGNOSIS — M6281 Muscle weakness (generalized): Secondary | ICD-10-CM

## 2017-06-21 ENCOUNTER — Other Ambulatory Visit: Payer: Medicaid Other

## 2017-06-21 ENCOUNTER — Ambulatory Visit
Admission: RE | Admit: 2017-06-21 | Discharge: 2017-06-21 | Disposition: A | Discharge status: Home / Self Care | Payer: Medicaid Other | Source: Ambulatory Visit | Attending: Family Medicine | Admitting: Family Medicine

## 2017-06-21 DIAGNOSIS — I69359 Hemiplegia and hemiparesis following cerebral infarction affecting unspecified side: Secondary | ICD-10-CM

## 2017-06-21 DIAGNOSIS — I69320 Aphasia following cerebral infarction: Secondary | ICD-10-CM

## 2017-06-21 DIAGNOSIS — I69391 Dysphagia following cerebral infarction: Secondary | ICD-10-CM

## 2017-06-21 DIAGNOSIS — M6281 Muscle weakness (generalized): Secondary | ICD-10-CM

## 2017-07-17 ENCOUNTER — Ambulatory Visit: Payer: Medicaid Other | Admitting: Sports Medicine

## 2017-08-14 ENCOUNTER — Encounter: Payer: Self-pay | Admitting: Sports Medicine

## 2017-08-14 ENCOUNTER — Ambulatory Visit: Payer: Medicaid Other | Admitting: Sports Medicine

## 2017-08-14 DIAGNOSIS — R600 Localized edema: Secondary | ICD-10-CM

## 2017-08-14 DIAGNOSIS — M79675 Pain in left toe(s): Secondary | ICD-10-CM

## 2017-08-14 DIAGNOSIS — M79674 Pain in right toe(s): Secondary | ICD-10-CM

## 2017-08-14 DIAGNOSIS — B351 Tinea unguium: Secondary | ICD-10-CM

## 2017-08-14 DIAGNOSIS — E1142 Type 2 diabetes mellitus with diabetic polyneuropathy: Secondary | ICD-10-CM

## 2017-08-14 DIAGNOSIS — B353 Tinea pedis: Secondary | ICD-10-CM

## 2017-08-14 DIAGNOSIS — Z8673 Personal history of transient ischemic attack (TIA), and cerebral infarction without residual deficits: Secondary | ICD-10-CM

## 2017-08-14 NOTE — Progress Notes (Signed)
Patient ID: [name], female   DOB: 1960-07-15, 57 y.o.   MRN: [id number]   Subjective: [name] is a 57 y.o. female patient with history of type 2 diabetes who presents to office today complaining of long, painful nails; unable to trim. Patient states that the glucose reading this morning was 131, A1c 8.3. Patient is assisted by daughter who helps to report this since patient can not clearly talk since after stroke. Reports that notice odor to feet sometimes and spliting nails. Daughter denies any other acute problems or issues.  Patient Active Problem List   Diagnosis Date Noted  . Chronic edema 09/25/2016  . Lymphedema of right lower extremity 08/09/2016  . Hemiparesis, aphasia, and dysphagia as late effect of cerebrovascular accident (CVA) ([place]) 06/23/2016  . Seizures ([place]) 09/29/2015  . Urine leukocytes 01/08/2015  . Expressive aphasia 01/08/2015  . Malnutrition of moderate degree ([place]) 11/11/2014  . Syncope 11/08/2014  . Dehydration 11/08/2014  . Staring spell 07/09/2014  . Adhesive capsulitis of right shoulder 06/04/2014  . Palpitations 04/02/2014  . Essential hypertension 01/05/2014  . DM (diabetes mellitus) ([place]) 01/05/2014  . Paranoid schizophrenia ([place]) 12/28/2013  . Cerebral embolism with cerebral infarction ([place]) 12/05/2013  . Sinus tachycardia 12/05/2013  . 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 Spastic hemiplegia affecting dominant side ([place]) 09/12/2013  . CVA (cerebral infarction) 06/04/2013  . Dysphagia 06/02/2013  . Benign polycythemia 06/02/2013  . Malignant hypertension 05/28/2013  . DM (diabetes mellitus) type II controlled, neurological manifestation ([place]) 05/27/2013  . Acute embolic stroke ([place]) 85/27/7824  . Aphasia 05/27/2013  . 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 Hypertensive emergency 05/27/2013  . Stroke ([place]) 05/27/2013  . Aphasia  complicating stroke 23/53/6144   Current Outpatient Medications on File Prior to Visit  Medication Sig Dispense Refill  . ACCU-CHEK AVIVA PLUS test strip USE ONE STRIP TO TEST BEFORE MEALS AND AND AT BEDTIME 100 each 5  . ACCU-CHEK SOFTCLIX LANCETS lancets FOR TEST DOSE BEFORE MEALS AND AT BEDTIME 100 each 5  . acetaminophen (TYLENOL) 325 MG tablet Take 1-2 tablets (325-650 mg total) by mouth every 4 (four) hours as needed for mild pain (available over the counter).    Marland Kitchen amLODipine (NORVASC) 10 MG tablet Take 1 tablet (10 mg total) by mouth daily. 30 tablet 5  . aspirin 325 MG tablet Take 325 mg by mouth daily. Reported on 09/25/2015    . atorvastatin (LIPITOR) 20 MG tablet Take 1 tablet (20 mg total) by mouth daily. 30 tablet 5  . baclofen (LIORESAL) 10 MG tablet TAKE ONE (1) TABLET BY MOUTH TWO (2) TIMES DAILY TO HELP WITH TONE/SPASMS 60 each 2  . benzonatate (TESSALON) 100 MG capsule Take 1 capsule (100 mg total) by mouth every 8 (eight) hours. 21 capsule 0  . betamethasone valerate ointment (VALISONE) 0.1 % Apply topically.    . cetirizine (ZYRTEC) 10 MG tablet TAKE ONE (1) TABLET BY MOUTH EVERY DAY 30 tablet 3  . gabapentin (NEURONTIN) 100 MG capsule Take 1 capsule (100 mg total) by mouth 3 (three) times daily. 90 capsule 5  . hydrochlorothiazide (HYDRODIURIL) 25 MG tablet TAKE ONE (1) TABLET BY MOUTH EVERY DAY 30 tablet 5  . ibuprofen (ADVIL,MOTRIN) 400 MG tablet Take 1 tablet (400 mg total) by mouth every 6 (six) hours as needed. 30 tablet 0  . insulin aspart (NOVOLOG) 100 UNIT/ML injection 0-12 units as per sliding  scale 10 mL 3  . lubiprostone (AMITIZA) 8 MCG capsule Take 1 capsule (8 mcg total) by mouth 2 (two) times daily with a meal. 60 capsule 11  . metoprolol (LOPRESSOR) 100 MG tablet Take 1.5 tablets (150 mg total) by mouth 2 (two) times daily. 90 tablet 5  . nystatin (MYCOSTATIN/NYSTOP) powder Apply topically 2 (two) times daily. 45 g 0  . polyethylene glycol powder  (GLYCOLAX/MIRALAX) powder Take 17 g by mouth daily as needed (constipation). 527 g 3  . protein supplement shake (PREMIER PROTEIN) LIQD Take 2 oz by mouth daily.    . SENNA LAX 8.6 MG tablet TAKE 1 TABLET BY MOUTH EVERY DAY AS NEEDED FOR CONSTIPATION 30 tablet 2  . Spacer/Aero-Holding Chambers (E-Z SPACER) inhaler Use as instructed 1 each 2  . UNIFINE PENTIPS 31G X 6 MM MISC USE AS PRESCRIBED BY PHYSICIAN 100 each 11   No current facility-administered medications on file prior to visit.    No Known Allergies   Objective: General: Patient is awake, alert, and oriented x 2 and in no acute distress.  Integument: Skin is warm, dry and supple bilateral. Nails are tender, long, thickened and dystrophic with subungual debris, consistent with onychomycosis, 1-5 bilateral. No interdigital maceration bilateral, No signs of infection. No open lesions or preulcerative lesions present bilateral. Remaining integument unremarkable.  Vasculature:  Dorsalis Pedis pulse 0/4 bilateral. Posterior Tibial pulse  1/4 bilateral. Capillary fill time <3 sec 1-5 bilateral. No hair growth to the level of the digits.Temperature gradient within normal limits. No varicosities present bilateral. +2 pitting edema present bilateral R>L secondary to stroke.   Neurology: The patient has diminished sensation measured with a 5.07/10g Semmes Weinstein Monofilament at toes bilateral . Vibratory sensation diminished bilateral with tuning fork. No Babinski sign present bilateral.   Musculoskeletal: No gross pedal deformities noted bilateral. Muscular strength 4/5 in all lower extremity muscular groups on left, 3/5 on right without pain on range of motion; Stroke with right sided weakness. No tenderness with calf compression bilateral.  Assessment and Plan: Problem List Items Addressed This Visit    None    Visit Diagnoses    Dermatophytosis of nail    -  Primary   Pain in toes of both feet       Diabetic polyneuropathy  associated with type 2 diabetes mellitus (HCC)       Localized edema       History of stroke       Tinea pedis of both feet         -Examined patient. -Discussed and educated patient on diabetic foot care, especially with  regards to the vascular, neurological and musculoskeletal systems.  -Stressed the importance of good glycemic control and the detriment of not  controlling glucose levels in relation to the foot. -Mechanically debrided all nails 1-5 bilateral using sterile nail nipper and filed with dremel without incident  -Recommend lamisil spray to feet at bedtime  -Recommend biotin or nail strengthening polish for spliting naiils  -Patient to return in 2.5 to 3 months for at risk foot care -Patient advised to call the office if any problems or questions arise in the meantime.  Landis Martins, DPM

## 2017-10-03 DIAGNOSIS — E782 Mixed hyperlipidemia: Secondary | ICD-10-CM | POA: Insufficient documentation

## 2017-11-13 ENCOUNTER — Ambulatory Visit: Payer: Medicaid Other | Admitting: Sports Medicine

## 2017-11-13 ENCOUNTER — Encounter: Payer: Self-pay | Admitting: Sports Medicine

## 2017-11-13 DIAGNOSIS — R600 Localized edema: Secondary | ICD-10-CM

## 2017-11-13 DIAGNOSIS — E1142 Type 2 diabetes mellitus with diabetic polyneuropathy: Secondary | ICD-10-CM

## 2017-11-13 DIAGNOSIS — B351 Tinea unguium: Secondary | ICD-10-CM

## 2017-11-13 DIAGNOSIS — M79674 Pain in right toe(s): Secondary | ICD-10-CM | POA: Diagnosis not present

## 2017-11-13 DIAGNOSIS — M79675 Pain in left toe(s): Secondary | ICD-10-CM | POA: Diagnosis not present

## 2017-11-13 DIAGNOSIS — Z8673 Personal history of transient ischemic attack (TIA), and cerebral infarction without residual deficits: Secondary | ICD-10-CM

## 2017-11-13 NOTE — Progress Notes (Signed)
Patient ID: [name], female   DOB: [birth year], 57 y.o.   MRN: [id number]   Subjective: [name] is a 57 y.o. female patient with history of type 2 diabetes who presents to office today complaining of long, painful nails; unable to trim. Patient states that the glucose reading this morning was 181, A1c unknown. Patient is assisted by daughter who helps to report this since patient can not clearly talk since after stroke. Daughter denies any other acute problems or issues.  Patient Active Problem List   Diagnosis Date Noted  . Mixed hyperlipidemia 10/03/2017  . Chronic edema 09/25/2016  . Lymphedema of right lower extremity 08/09/2016  . Hemiparesis, aphasia, and dysphagia as late effect of cerebrovascular accident (CVA) ([place]) 06/23/2016  . Seizures ([place]) 09/29/2015  . Urine leukocytes 01/08/2015  . Expressive aphasia 01/08/2015  . Malnutrition of moderate degree ([place]) 11/11/2014  . Syncope 11/08/2014  . Dehydration 11/08/2014  . Staring spell 07/09/2014  . Adhesive capsulitis of right shoulder 06/04/2014  . Palpitations 04/02/2014  . Essential hypertension 01/05/2014  . DM (diabetes mellitus) ([place]) 01/05/2014  . Paranoid schizophrenia ([place]) 12/28/2013  . Cerebral embolism with cerebral infarction ([place]) 12/05/2013  . Sinus tachycardia 12/05/2013  . 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 Spastic hemiplegia affecting dominant side ([place]) 09/12/2013  . CVA (cerebral infarction) 06/04/2013  . Dysphagia 06/02/2013  . Benign polycythemia 06/02/2013  . Malignant hypertension 05/28/2013  . DM (diabetes mellitus) type II controlled, neurological manifestation ([place]) 05/27/2013  . Acute embolic stroke ([place]) 27/78/2423  . Aphasia 05/27/2013  . 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 Hypertensive emergency 05/27/2013  . Stroke ([place]) 05/27/2013  . Aphasia complicating stroke  53/61/4431   Current Outpatient Medications on File Prior to Visit  Medication Sig Dispense Refill  . ACCU-CHEK AVIVA PLUS test strip USE ONE STRIP TO TEST BEFORE MEALS AND AND AT BEDTIME 100 each 5  . ACCU-CHEK SOFTCLIX LANCETS lancets FOR TEST DOSE BEFORE MEALS AND AT BEDTIME 100 each 5  . acetaminophen (TYLENOL) 325 MG tablet Take 1-2 tablets (325-650 mg total) by mouth every 4 (four) hours as needed for mild pain (available over the counter).    Marland Kitchen amLODipine (NORVASC) 10 MG tablet Take 1 tablet (10 mg total) by mouth daily. 30 tablet 5  . aspirin 325 MG tablet Take 325 mg by mouth daily. Reported on 09/25/2015    . atorvastatin (LIPITOR) 20 MG tablet Take 1 tablet (20 mg total) by mouth daily. 30 tablet 5  . baclofen (LIORESAL) 10 MG tablet TAKE ONE (1) TABLET BY MOUTH TWO (2) TIMES DAILY TO HELP WITH TONE/SPASMS 60 each 2  . benzonatate (TESSALON) 100 MG capsule Take 1 capsule (100 mg total) by mouth every 8 (eight) hours. 21 capsule 0  . betamethasone valerate ointment (VALISONE) 0.1 % Apply topically.    . cetirizine (ZYRTEC) 10 MG tablet TAKE ONE (1) TABLET BY MOUTH EVERY DAY 30 tablet 3  . gabapentin (NEURONTIN) 100 MG capsule Take 1 capsule (100 mg total) by mouth 3 (three) times daily. 90 capsule 5  . hydrochlorothiazide (HYDRODIURIL) 25 MG tablet TAKE ONE (1) TABLET BY MOUTH EVERY DAY 30 tablet 5  . ibuprofen (ADVIL,MOTRIN) 400 MG tablet Take 1 tablet (400 mg total) by mouth every 6 (six) hours as needed. 30 tablet 0  . insulin aspart (NOVOLOG) 100 UNIT/ML injection 0-12 units as per sliding scale 10 mL 3  .  lubiprostone (AMITIZA) 8 MCG capsule Take 1 capsule (8 mcg total) by mouth 2 (two) times daily with a meal. 60 capsule 11  . metoprolol (LOPRESSOR) 100 MG tablet Take 1.5 tablets (150 mg total) by mouth 2 (two) times daily. 90 tablet 5  . nystatin (MYCOSTATIN/NYSTOP) powder Apply topically 2 (two) times daily. 45 g 0  . polyethylene glycol powder (GLYCOLAX/MIRALAX) powder Take 17 g  by mouth daily as needed (constipation). 527 g 3  . protein supplement shake (PREMIER PROTEIN) LIQD Take 2 oz by mouth daily.    . SENNA LAX 8.6 MG tablet TAKE 1 TABLET BY MOUTH EVERY DAY AS NEEDED FOR CONSTIPATION 30 tablet 2  . Spacer/Aero-Holding Chambers (E-Z SPACER) inhaler Use as instructed 1 each 2  . UNIFINE PENTIPS 31G X 6 MM MISC USE AS PRESCRIBED BY PHYSICIAN 100 each 11   No current facility-administered medications on file prior to visit.    No Known Allergies   Objective: General: Patient is awake, alert, and oriented x 2 and in no acute distress.  Integument: Skin is warm, dry and supple bilateral. Nails are tender, long, thickened and dystrophic with subungual debris, consistent with onychomycosis, 1-5 bilateral. No interdigital maceration bilateral, No signs of infection. No open lesions or preulcerative lesions present bilateral. Remaining integument unremarkable.  Vasculature:  Dorsalis Pedis pulse 0/4 bilateral. Posterior Tibial pulse  1/4 bilateral. Capillary fill time <3 sec 1-5 bilateral. No hair growth to the level of the digits.Temperature gradient within normal limits. No varicosities present bilateral. +2 pitting edema present bilateral R>L secondary to stroke.   Neurology: The patient has diminished sensation measured with a 5.07/10g Semmes Weinstein Monofilament at toes bilateral . Vibratory sensation diminished bilateral with tuning fork. No Babinski sign present bilateral.   Musculoskeletal: No gross pedal deformities noted bilateral. Muscular strength 4/5 in all lower extremity muscular groups on left, 3/5 on right without pain on range of motion; Stroke with right sided weakness. No tenderness with calf compression bilateral.  Assessment and Plan: Problem List Items Addressed This Visit    None    Visit Diagnoses    Dermatophytosis of nail    -  Primary   Pain in toes of both feet       Diabetic polyneuropathy associated with type 2 diabetes mellitus  (HCC)       Localized edema       History of stroke         -Examined patient. -Discussed and educated patient on diabetic foot care, especially with  regards to the vascular, neurological and musculoskeletal systems.  -Stressed the importance of good glycemic control and the detriment of not  controlling glucose levels in relation to the foot. -Mechanically debrided all nails 1-5 bilateral using sterile nail nipper and filed with dremel without incident  -Encouraged daughter to get lamisil spray for bedtime -Patient to return in 2.5 to 3 months for at risk foot care -Patient advised to call the office if any problems or questions arise in the meantime.  Landis Martins, DPM

## 2018-02-19 ENCOUNTER — Ambulatory Visit: Payer: Medicaid Other | Admitting: Podiatry

## 2018-02-19 DIAGNOSIS — L89610 Pressure ulcer of right heel, unstageable: Secondary | ICD-10-CM

## 2018-02-19 DIAGNOSIS — E1142 Type 2 diabetes mellitus with diabetic polyneuropathy: Secondary | ICD-10-CM

## 2018-02-19 DIAGNOSIS — M79674 Pain in right toe(s): Secondary | ICD-10-CM | POA: Diagnosis not present

## 2018-02-19 DIAGNOSIS — B351 Tinea unguium: Secondary | ICD-10-CM | POA: Diagnosis not present

## 2018-02-19 DIAGNOSIS — M79675 Pain in left toe(s): Secondary | ICD-10-CM | POA: Diagnosis not present

## 2018-02-19 NOTE — Patient Instructions (Addendum)
Onychomycosis/Fungal Toenails  WHAT IS IT? An infection that lies within the keratin of your nail plate that is caused by a fungus.  WHY ME? Fungal infections affect all ages, sexes, races, and creeds.  There may be many factors that predispose you to a fungal infection such as age, coexisting medical conditions such as diabetes, or an autoimmune disease; stress, medications, fatigue, genetics, etc.  Bottom line: fungus thrives in a warm, moist environment and your shoes offer such a location.  IS IT CONTAGIOUS? Theoretically, yes.  You do not want to share shoes, nail clippers or files with someone who has fungal toenails.  Walking around barefoot in the same room or sleeping in the same bed is unlikely to transfer the organism.  It is important to realize, however, that fungus can spread easily from one nail to the next on the same foot.  HOW DO WE TREAT THIS?  There are several ways to treat this condition.  Treatment may depend on many factors such as age, medications, pregnancy, liver and kidney conditions, etc.  It is best to ask your doctor which options are available to you.  1. No treatment.   Unlike many other medical concerns, you can live with this condition.  However for many people this can be a painful condition and may lead to ingrown toenails or a bacterial infection.  It is recommended that you keep the nails cut short to help reduce the amount of fungal nail. 2. Topical treatment.  These range from herbal remedies to prescription strength nail lacquers.  About 40-50% effective, topicals require twice daily application for approximately 9 to 12 months or until an entirely new nail has grown out.  The most effective topicals are medical grade medications available through physicians offices. 3. Oral antifungal medications.  With an 80-90% cure rate, the most common oral medication requires 3 to 4 months of therapy and stays in your system for a year as the new nail grows out.  Oral  antifungal medications do require blood work to make sure it is a safe drug for you.  A liver function panel will be performed prior to starting the medication and after the first month of treatment.  It is important to have the blood work performed to avoid any harmful side effects.  In general, this medication safe but blood work is required. 4. Laser Therapy.  This treatment is performed by applying a specialized laser to the affected nail plate.  This therapy is noninvasive, fast, and non-painful.  It is not covered by insurance and is therefore, out of pocket.  The results have been very good with a 80-95% cure rate.  The [facility] is the only practice in the area to offer this therapy. 5. Permanent Nail Avulsion.  Removing the entire nail so that a new nail will not grow back.  Diabetes and Foot Care Diabetes may cause you to have problems because of poor blood supply (circulation) to your feet and legs. This may cause the skin on your feet to become thinner, break easier, and heal more slowly. Your skin may become dry, and the skin may peel and crack. You may also have nerve damage in your legs and feet causing decreased feeling in them. You may not notice minor injuries to your feet that could lead to infections or more serious problems. Taking care of your feet is one of the most important things you can do for yourself. Follow these instructions at home:  Wear   shoes at all times, even in the house. Do not go barefoot. Bare feet are easily injured.  Check your feet daily for blisters, cuts, and redness. If you cannot see the bottom of your feet, use a mirror or ask someone for help.  Wash your feet with warm water (do not use hot water) and mild soap. Then pat your feet and the areas between your toes until they are completely dry. Do not soak your feet as this can dry your skin.  Apply a moisturizing lotion or petroleum jelly (that does not contain alcohol and is unscented) to the  skin on your feet and to dry, brittle toenails. Do not apply lotion between your toes.  Trim your toenails straight across. Do not dig under them or around the cuticle. File the edges of your nails with an emery board or nail file.  Do not cut corns or calluses or try to remove them with medicine.  Wear clean socks or stockings every day. Make sure they are not too tight. Do not wear knee-high stockings since they may decrease blood flow to your legs.  Wear shoes that fit properly and have enough cushioning. To break in new shoes, wear them for just a few hours a day. This prevents you from injuring your feet. Always look in your shoes before you put them on to be sure there are no objects inside.  Do not cross your legs. This may decrease the blood flow to your feet.  If you find a minor scrape, cut, or break in the skin on your feet, keep it and the skin around it clean and dry. These areas may be cleansed with mild soap and water. Do not cleanse the area with peroxide, alcohol, or iodine.  When you remove an adhesive bandage, be sure not to damage the skin around it.  If you have a wound, look at it several times a day to make sure it is healing.  Do not use heating pads or hot water bottles. They may burn your skin. If you have lost feeling in your feet or legs, you may not know it is happening until it is too late.  Make sure your health care provider performs a complete foot exam at least annually or more often if you have foot problems. Report any cuts, sores, or bruises to your health care provider immediately. Contact a health care provider if:  You have an injury that is not healing.  You have cuts or breaks in the skin.  You have an ingrown nail.  You notice redness on your legs or feet.  You feel burning or tingling in your legs or feet.  You have pain or cramps in your legs and feet.  Your legs or feet are numb.  Your feet always feel cold. Get help right away  if:  There is increasing redness, swelling, or pain in or around a wound.  There is a red line that goes up your leg.  Pus is coming from a wound.  You develop a fever or as directed by your health care provider.  You notice a bad smell coming from an ulcer or wound. This information is not intended to replace advice given to you by your health care provider. Make sure you discuss any questions you have with your health care provider. Document Released: 03/03/2000 Document Revised: 08/12/2015 Document Reviewed: 08/13/2012 [employer] Interactive Patient Education  2017 [employer]  

## 2018-03-04 ENCOUNTER — Encounter: Payer: Self-pay | Admitting: Podiatry

## 2018-03-04 NOTE — Progress Notes (Signed)
Subjective: [name] presents today with history of neuropathy with cc of painful, mycotic toenails.  Pain is aggravated when wearing enclosed shoe gear and relieved with periodic professional debridement.  [name] has h/o CVA with RLE weakness.  Her daughter is present during the visit. She is concerned about [name]' heels.  Her neuropathy is managed with gabapentin.  Bartholome Bill, MD is her PCP and last visit was 02/04/2018.  She voices no new problems on today's visit.   Current Outpatient Medications:  .  ACCU-CHEK AVIVA PLUS test strip, USE ONE STRIP TO TEST BEFORE MEALS AND AND AT BEDTIME, Disp: 100 each, Rfl: 5 .  ACCU-CHEK SOFTCLIX LANCETS lancets, FOR TEST DOSE BEFORE MEALS AND AT BEDTIME, Disp: 100 each, Rfl: 5 .  acetaminophen (TYLENOL) 325 MG tablet, Take 1-2 tablets (325-650 mg total) by mouth every 4 (four) hours as needed for mild pain (available over the counter)., Disp: , Rfl:  .  amLODipine (NORVASC) 10 MG tablet, Take 1 tablet (10 mg total) by mouth daily., Disp: 30 tablet, Rfl: 5 .  aspirin 325 MG tablet, Take 325 mg by mouth daily. Reported on 09/25/2015, Disp: , Rfl:  .  atorvastatin (LIPITOR) 20 MG tablet, Take 1 tablet (20 mg total) by mouth daily., Disp: 30 tablet, Rfl: 5 .  baclofen (LIORESAL) 10 MG tablet, TAKE ONE (1) TABLET BY MOUTH TWO (2) TIMES DAILY TO HELP WITH TONE/SPASMS, Disp: 60 each, Rfl: 2 .  benzonatate (TESSALON) 100 MG capsule, Take 1 capsule (100 mg total) by mouth every 8 (eight) hours., Disp: 21 capsule, Rfl: 0 .  betamethasone valerate ointment (VALISONE) 0.1 %, Apply topically., Disp: , Rfl:  .  cetirizine (ZYRTEC) 10 MG tablet, TAKE ONE (1) TABLET BY MOUTH EVERY DAY, Disp: 30 tablet, Rfl: 3 .  gabapentin (NEURONTIN) 100 MG capsule, Take 1 capsule (100 mg total) by mouth 3 (three) times daily., Disp: 90 capsule, Rfl: 5 .  glucose blood test strip, USE TO TEST BLOOD SUGARS BEFORE MEALS AND AT BEDTIME, Disp: , Rfl:  .   hydrochlorothiazide (HYDRODIURIL) 25 MG tablet, TAKE ONE (1) TABLET BY MOUTH EVERY DAY, Disp: 30 tablet, Rfl: 5 .  ibuprofen (ADVIL,MOTRIN) 400 MG tablet, Take 1 tablet (400 mg total) by mouth every 6 (six) hours as needed., Disp: 30 tablet, Rfl: 0 .  insulin aspart (NOVOLOG) 100 UNIT/ML injection, 0-12 units as per sliding scale, Disp: 10 mL, Rfl: 3 .  lubiprostone (AMITIZA) 8 MCG capsule, Take 1 capsule (8 mcg total) by mouth 2 (two) times daily with a meal., Disp: 60 capsule, Rfl: 11 .  metoprolol (LOPRESSOR) 100 MG tablet, Take 1.5 tablets (150 mg total) by mouth 2 (two) times daily., Disp: 90 tablet, Rfl: 5 .  nystatin (MYCOSTATIN/NYSTOP) powder, Apply topically 2 (two) times daily., Disp: 45 g, Rfl: 0 .  polyethylene glycol powder (GLYCOLAX/MIRALAX) powder, Take 17 g by mouth daily as needed (constipation)., Disp: 527 g, Rfl: 3 .  protein supplement shake (PREMIER PROTEIN) LIQD, Take 2 oz by mouth daily., Disp: , Rfl:  .  senna (SENOKOT) 8.6 MG tablet, Take by mouth., Disp: , Rfl:  .  SENNA LAX 8.6 MG tablet, TAKE 1 TABLET BY MOUTH EVERY DAY AS NEEDED FOR CONSTIPATION, Disp: 30 tablet, Rfl: 2 .  Spacer/Aero-Holding Chambers (E-Z SPACER) inhaler, Use as instructed, Disp: 1 each, Rfl: 2 .  UNIFINE PENTIPS 31G X 6 MM MISC, USE AS PRESCRIBED BY PHYSICIAN, Disp: 100 each, Rfl: 11  No Known Allergies  Objective:  Vascular Examination: Capillary refill time <3 seconds x 10 digits Dorsalis pedis 0/4 b/l Posterior tibial pulses 1/4 b/l  Digital hair x 10 digits was absent Skin temperature gradient WNL b/l +2 pitting edema R>L  Dermatological Examination: Skin with normal texture and tone b/l  Toenails 1-5 b/l discolored, thick, dystrophic with subungual debris and pain with palpation to nailbeds due to thickness of nails.  Appears to be getting some pressure on the posterior right heel which is nearly flaccid. No erythema, no edema, no drainage, no eschar formation. +Tenderness to  palpation. No flocculence, no blister.  Musculoskeletal: Muscle strength 4/5 to all muscle groups LLE  RLE nearly flaccid.  Neurological: Sensation with 10 gram monofilament is absent b/l  Vibratory sensation absent b/l  Assessment: 1. Painful onychomycosis toenails 1-5 b/l 2. NIDDM with neuropathy 3. Early signs of pressure posterior right heel, unstageable  Plan: 1. Educated daughter about pressure sores that can develop on heels. Protection of this area is imperative to avoid development of wound. Advised aggressive floating of patient's heels when patient is in bed or sitting up in recliner. Daughter will purchase heel pillows from [employer] and apply to both feet daily. This will be needed long term. Daughter related understanding. 2. Toenails 1-5 b/l were debrided in length and girth without iatrogenic bleeding. 3. Patient to continue soft, supportive shoe gear 4. Patient to report any pedal injuries to medical professional  5. Follow up 3 months. Patient/POA to call should there be a concern in the interim.

## 2018-03-07 DIAGNOSIS — N950 Postmenopausal bleeding: Secondary | ICD-10-CM | POA: Insufficient documentation

## 2018-05-08 ENCOUNTER — Other Ambulatory Visit: Payer: Self-pay | Admitting: Family Medicine

## 2018-05-08 DIAGNOSIS — Z1231 Encounter for screening mammogram for malignant neoplasm of breast: Secondary | ICD-10-CM

## 2018-05-15 ENCOUNTER — Encounter: Payer: Self-pay | Admitting: Neurology

## 2018-05-15 ENCOUNTER — Ambulatory Visit: Payer: Medicaid Other | Admitting: Neurology

## 2018-05-15 VITALS — BP 166/98 | HR 101

## 2018-05-15 DIAGNOSIS — I6300 Cerebral infarction due to thrombosis of unspecified precerebral artery: Secondary | ICD-10-CM | POA: Diagnosis not present

## 2018-05-15 NOTE — Progress Notes (Signed)
[facility] [address] [place]. [place] [address] [phone]       OFFICE CONSULT NOTE  [name] Date of Birth:  1960-12-22 Medical Record Number:  [id number]   Referring MD: Precious Haws Reason for Referral: Stroke follow-up  HPI: [name] is a 58 year old African-American lady seen today for consultation.  She is accompanied by her daughter.  History is provided by her, review of electronic medical records and have personally reviewed imaging films in PACS.  She is a 58 year old African-American lady with past medical history of hypertension, diabetes, bipolar disorder who was initially admitted on 05/27/2013 with aphasia and right facial droop.  She was found to have a large left middle cerebral artery infarct on the MRI scan with left middle cerebral artery occlusion in the M1 segment without any collateralization.  Infarcts were felt to be embolic due to unidentified source.  She was placed on aspirin for secondary stroke prevention but had progressive neurological worsening on 05/29/2013.  Symptoms were felt to be due to relative hypotension.  She did not improve despite permissive hypertension.  Extensive work-up including hypercoagulable panel and echocardiogram were negative.  EEG was negative for seizures.  She subsequently had a feeding tube and was transferred to rehab.  She was followed by Dr. Charleen Kirks and got some Botox injections which she did not tolerate well.  She has been living at home with her daughter who provides care.  She does have a CNA during the daytime when the daughter is out.  The patient was followed in office and was last seen by Dr. Erlinda Hong on 07/17/2016.  She apparently has not had any definite stroke or TIA symptoms since her original stroke.  She is had some intermittent transient episodes of confusion when she gets upset.  The daughter has not noticed any new focal neurological findings.  The patient has been having some intermittent  vaginal bleeding.  Her gynecologist plans to do exam under general anesthesia and patient is referred to me for neurological clearance.  Review of electronic records show that she has not had any brain imaging study or follow-up carotid Dopplers were brain vascular studies over the last several years.  I do not also find any recent lab work for cholesterol or diabetes control.  ROS:   14 system review of systems is positive for leg swelling, incontinence, joint pain and swelling, confusion, speech difficulty, gait difficulty and all other systems negative  PMH:  Past Medical History:  Diagnosis Date  . Aphasia due to stroke   . Bipolar affective disorder ([place])   . Depression   . Diabetes mellitus ([place])    ?type (05/29/2013)  . Episodic confusion   . 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 Hypertension   . Schizophrenia ([place])    [name] 11/28/2007 (05/29/2013)  . Seizures ([place])   . Stroke [facility]) 05/27/2013    Social History:  Social History   Socioeconomic History  . Marital status: Single    Spouse name: Not on file  . Number of children: 3  . Years of education: 35  . Highest education level: Not on file  Occupational History  . Occupation: disabled  Social Needs  . Financial resource strain: Not on file  . Food insecurity:    Worry: Not on file    Inability: Not on file  . Transportation needs:    Medical: Not on file    Non-medical: Not on file  Tobacco Use  . Smoking status: Never Smoker  . Smokeless tobacco: Never Used  Substance and Sexual Activity  . Alcohol use: No    Alcohol/week: 0.0 standard drinks  . Drug use: No  . Sexual activity: Not on file  Lifestyle  . Physical activity:    Days per week: Not on file    Minutes per session: Not on file  . Stress: Not on file  Relationships  . Social connections:    Talks on phone: Not on file    Gets together: Not on file    Attends religious service: Not on file    Active member  of club or organization: Not on file    Attends meetings of clubs or organizations: Not on file    Relationship status: Not on file  . Intimate partner violence:    Fear of current or ex partner: Not on file    Emotionally abused: Not on file    Physically abused: Not on file    Forced sexual activity: Not on file  Other Topics Concern  . Not on file  Social History Narrative   Patient is single with 3 children.   Patient is right handed.   Patient has hs education.   Patient does not drink caffeine.    Medications:   Current Outpatient Medications on File Prior to Visit  Medication Sig Dispense Refill  . ACCU-CHEK AVIVA PLUS test strip USE ONE STRIP TO TEST BEFORE MEALS AND AND AT BEDTIME 100 each 5  . ACCU-CHEK SOFTCLIX LANCETS lancets FOR TEST DOSE BEFORE MEALS AND AT BEDTIME 100 each 5  . acetaminophen (TYLENOL) 325 MG tablet Take 1-2 tablets (325-650 mg total) by mouth every 4 (four) hours as needed for mild pain (available over the counter).    Marland Kitchen amLODipine (NORVASC) 10 MG tablet Take 1 tablet (10 mg total) by mouth daily. 30 tablet 5  . aspirin 325 MG tablet Take 325 mg by mouth daily. Reported on 09/25/2015    . atorvastatin (LIPITOR) 20 MG tablet Take 1 tablet (20 mg total) by mouth daily. 30 tablet 5  . baclofen (LIORESAL) 10 MG tablet TAKE ONE (1) TABLET BY MOUTH TWO (2) TIMES DAILY TO HELP WITH TONE/SPASMS 60 each 2  . benzonatate (TESSALON) 100 MG capsule Take 1 capsule (100 mg total) by mouth every 8 (eight) hours. 21 capsule 0  . betamethasone valerate ointment (VALISONE) 0.1 % Apply topically.    . cetirizine (ZYRTEC) 10 MG tablet TAKE ONE (1) TABLET BY MOUTH EVERY DAY 30 tablet 3  . gabapentin (NEURONTIN) 100 MG capsule Take 1 capsule (100 mg total) by mouth 3 (three) times daily. 90 capsule 5  . glucose blood test strip USE TO TEST BLOOD SUGARS BEFORE MEALS AND AT BEDTIME    . hydrochlorothiazide (HYDRODIURIL) 25 MG tablet TAKE ONE (1) TABLET BY MOUTH EVERY DAY 30  tablet 5  . ibuprofen (ADVIL,MOTRIN) 400 MG tablet Take 1 tablet (400 mg total) by mouth every 6 (six) hours as needed. 30 tablet 0  . insulin aspart (NOVOLOG) 100 UNIT/ML injection 0-12 units as per sliding scale 10 mL 3  . lubiprostone (AMITIZA) 8 MCG capsule Take 1 capsule (8 mcg total) by mouth 2 (two) times daily with a meal. 60 capsule 11  . metoprolol (LOPRESSOR) 100 MG tablet Take 1.5 tablets (150 mg total) by mouth 2 (two) times daily. 90 tablet 5  . nystatin (MYCOSTATIN/NYSTOP) powder Apply topically 2 (two) times daily. 45 g 0  .  polyethylene glycol powder (GLYCOLAX/MIRALAX) powder Take 17 g by mouth daily as needed (constipation). 527 g 3  . protein supplement shake (PREMIER PROTEIN) LIQD Take 2 oz by mouth daily.    Marland Kitchen senna (SENOKOT) 8.6 MG tablet Take by mouth.    . Spacer/Aero-Holding Chambers (E-Z SPACER) inhaler Use as instructed 1 each 2  . UNIFINE PENTIPS 31G X 6 MM MISC USE AS PRESCRIBED BY PHYSICIAN 100 each 11   No current facility-administered medications on file prior to visit.     Allergies:  No Known Allergies  Physical Exam General: well developed, well nourished middle-aged African-American lady, seated, in no evident distress Head: head normocephalic and atraumatic.   Neck: supple with no carotid or supraclavicular bruits Cardiovascular: regular rate and rhythm, no murmurs Musculoskeletal: no deformity.  She has right foot drop and AFO Skin:  no rash/petichiae Vascular:  Normal pulses all extremities  Neurologic Exam Mental Status: Awake and fully alert.  Severe expressive greater than receptive aphasia and speaks only occasional 1 or 2 words but difficult to comprehend.  She can understand simple midline commands and one-step commands only.  Unable to name and repeat.   Cranial Nerves: Fundoscopic exam reveals sharp disc margins. Pupils equal, briskly reactive to light. Extraocular movements full without nystagmus. Visual fields showed diminished blink to  threat on the right . Hearing intact. Facial sensation intact.  Right lower facial weakness., tongue, palate moves normally and symmetrically.  Motor: Spastic right hemiplegia with right upper extremity proximal strength 2/5 and distal 0/5 with fixed flexion contracture of the wrist and fingers.  Right lower extremity is 4/5 proximally and 3/5 distally with right ankle foot drop and wears an AFO.  Tone is increased on the right side with spasticity. Sensory.: intact to touch , pinprick , position and vibratory sensation.  Coordination: Rapid alternating movements normal in all extremities. Finger-to-nose and heel-to-shin performed accurately bilaterally. Gait and Station: Deferred as patient did not bring her hemiwalker.   Reflexes: 2+ and ssymmetric and brisker on the right. toes downgoing.   NIHSS  13 Modified [place]in  4   ASSESSMENT: 78 lady with left MCA infarct in March 2015 with significant residual aphasia and right hemiplegia vascular risk factors of hypertension, diabetes and cerebrovascular disease.  Patient has done reasonably well without recurrent neurovascular symptoms since 2015     PLAN: I had a long discussion with the patient and her daughter regarding her remote left middle cerebral artery infarct and significant residual aphasia and spastic right hemiplegia and answered questions.  She needs a gynecological procedure under general anesthesia which may represent a slight risk of periprocedural stroke but given the fact that she has not had any recurrent neurovascular symptoms for several years she is neurologically cleared but to avoid hypotension during and after the procedure.  Checks lipid profile, hemoglobin A1c, carotid ultrasound and transcranial Doppler studies as these have not been done for several years.  Continue aspirin for stroke prevention and strict control of hypertension with blood pressure goal below 130/90, lipids with LDL cholesterol goal below 70 mg percent and  diabetes with hemoglobin A1c goal below 6.5%.  Encouraged to eat a healthy diet and to be as active as possible.  Greater than 50% time during this 45-minute consultation visit was spent on counseling and coordination of care about her remote stroke , peri-operative stroke risk, and residual aphasia and hemiplegia and answering questions no scheduled follow-up appointment is necessary but she may return as needed only.  But to follow fall and safety precautions. Antony Contras, MD  [facility] [address] [place], [place] [address]  Phone [phone] Fax [phone] Note: This document was prepared with digital dictation and possible smart phrase technology. Any transcriptional errors that result from this process are unintentional.

## 2018-05-15 NOTE — Patient Instructions (Signed)
I had a long discussion with the patient and her daughter regarding her remote left middle cerebral artery infarct and significant residual aphasia and spastic right hemiplegia and answered questions.  She needs a gynecological procedure under general anesthesia which may represent a slight risk of periprocedural stroke but given the fact that she has not had any recurrent neurovascular symptoms for several years she is neurologically cleared but to avoid hypotension during and after the procedure.  Checks lipid profile, hemoglobin A1c, carotid ultrasound and transcranial Doppler studies as these have not been done for several years.  Continue aspirin for stroke prevention and strict control of hypertension with blood pressure goal below 130/90, lipids with LDL cholesterol goal below 70 mg percent and diabetes with hemoglobin A1c goal below 6.5%.  Encouraged to eat a healthy diet and to be as active as possible No scheduled follow-up appointment is necessary but she may return as needed only.  But to follow fall and safety precautions.

## 2018-05-16 LAB — HEMOGLOBIN A1C
Est. average glucose Bld gHb Est-mCnc: 183 mg/dL
Hgb A1c MFr Bld: 8 % — ABNORMAL HIGH (ref 4.8–5.6)

## 2018-05-16 LAB — LIPID PANEL
CHOL/HDL RATIO: 2.4 ratio (ref 0.0–4.4)
CHOLESTEROL TOTAL: 122 mg/dL (ref 100–199)
HDL: 50 mg/dL (ref >39)
LDL CALC: 61 mg/dL (ref 0–99)
TRIGLYCERIDES: 53 mg/dL (ref 0–149)
VLDL Cholesterol Cal: 11 mg/dL (ref 5–40)

## 2018-05-17 ENCOUNTER — Telehealth: Payer: Self-pay

## 2018-05-17 NOTE — Telephone Encounter (Signed)
Notes recorded by Marval Regal, RN on 05/17/2018 at 8:55 AM EST I called pts daughter [name] about her moms lab work. I stated the cholesterol profile came back satisfactory. Her moms hgba1c for diabetes was not satisfactory and she should follow up with her Dr, Precious Haws her PCP.The daughter verbalized understanding, and will follow up with PCP, and pt is on diabetic meds. ------

## 2018-05-17 NOTE — Telephone Encounter (Signed)
-----   Message from Garvin Fila, MD sent at 05/16/2018  3:36 PM EST ----- Kindly inform the patient that his cholesterol profile came back satisfactory.  Hemoglobin A1c for diabetes was not satisfactory and to see his primary care physician Dr. Luciana Axe to discuss more aggressive diabetes control.

## 2018-05-21 ENCOUNTER — Ambulatory Visit: Payer: Medicaid Other | Admitting: Podiatry

## 2018-05-21 DIAGNOSIS — B351 Tinea unguium: Secondary | ICD-10-CM

## 2018-05-21 DIAGNOSIS — M79675 Pain in left toe(s): Secondary | ICD-10-CM | POA: Diagnosis not present

## 2018-05-21 DIAGNOSIS — M79674 Pain in right toe(s): Secondary | ICD-10-CM | POA: Diagnosis not present

## 2018-05-21 DIAGNOSIS — E1142 Type 2 diabetes mellitus with diabetic polyneuropathy: Secondary | ICD-10-CM

## 2018-05-21 DIAGNOSIS — L89610 Pressure ulcer of right heel, unstageable: Secondary | ICD-10-CM

## 2018-05-21 NOTE — Patient Instructions (Addendum)
Diabetic Neuropathy Diabetic neuropathy refers to nerve damage that is caused by diabetes (diabetes mellitus). Over time, people with diabetes can develop nerve damage throughout the body. There are several types of diabetic neuropathy:  Peripheral neuropathy. This is the most common type of diabetic neuropathy. It causes damage to nerves that carry signals between the spinal cord and other parts of the body (peripheral nerves). This usually affects nerves in the feet and legs first, and may eventually affect the hands and arms. The damage affects the ability to sense touch or temperature.  Autonomic neuropathy. This type causes damage to nerves that control involuntary functions (autonomic nerves). These nerves carry signals that control: ? Heartbeat. ? Body temperature. ? Blood pressure. ? Urination. ? Digestion. ? Sweating. ? Sexual function. ? Response to changing blood sugar (glucose) levels.  Focal neuropathy. This type of nerve damage affects one area of the body, such as an arm, a leg, or the face. The injury may involve one nerve or a small group of nerves. Focal neuropathy can be painful and unpredictable, and occurs most often in older adults with diabetes. This often develops suddenly, but usually improves over time and does not cause long-term problems.  Proximal neuropathy. This type of nerve damage affects the nerves of the thighs, hips, buttocks, or legs. It causes severe pain, weakness, and muscle death (atrophy), usually in the thigh muscles. It is more common among older men and people who have type 2 diabetes. The length of recovery time may vary. What are the causes? Peripheral, autonomic, and focal neuropathies are caused by diabetes that is not well controlled with treatment. The cause of proximal neuropathy is not known, but it may be caused by inflammation related to uncontrolled blood glucose levels. What are the signs or symptoms? Peripheral neuropathy Peripheral  neuropathy develops slowly over time. When the nerves of the feet and legs no longer work, you may experience:  Burning, stabbing, or aching pain in the legs or feet.  Pain or cramping in the legs or feet.  Loss of feeling (numbness) and inability to feel pressure or pain in the feet. This can lead to: ? Thick calluses or sores on areas of constant pressure. ? Ulcers. ? Reduced ability to feel temperature changes.  Foot deformities.  Muscle weakness.  Loss of balance or coordination. Autonomic neuropathy The symptoms of autonomic neuropathy vary depending on which nerves are affected. Symptoms may include:  Problems with digestion, such as: ? Nausea or vomiting. ? Poor appetite. ? Bloating. ? Diarrhea or constipation. ? Trouble swallowing. ? Losing weight without trying to.  Problems with the heart, blood and lungs, such as: ? Dizziness, especially when standing up. ? Fainting. ? Shortness of breath. ? Irregular heartbeat.  Bladder problems, such as: ? Trouble starting or stopping urination. ? Leaking urine. ? Trouble emptying the bladder. ? Urinary tract infections (UTIs).  Problems with other body functions, such as: ? Sweat. You may sweat too much or too little. ? Temperature. You might get hot easily. Or, you might feel cold more than usual. ? Sexual function. Men may not be able to get or maintain an erection. Women may have vaginal dryness and difficulty with arousal. Focal neuropathy Symptoms affect only one area of the body. Common symptoms include:  Numbness.  Tingling.  Burning pain.  Prickling feeling.  Very sensitive skin.  Weakness.  Inability to move (paralysis).  Muscle twitching.  Muscles getting smaller (wasting).  Poor coordination.  Double or blurred vision. Proximal   neuropathy  Sudden, severe pain in the hip, thigh, or buttocks. Pain may spread from the back into the legs (sciatica).  Pain and numbness in the arms and  legs.  Tingling.  Loss of bladder control or bowel control.  Weakness and wasting of thigh muscles.  Difficulty getting up from a seated position.  Abdominal swelling.  Unexplained weight loss. How is this diagnosed? Diagnosis usually involves reviewing your medical history and any symptoms you have. Diagnosis varies depending on the type of neuropathy your health care provider suspects. Peripheral neuropathy Your health care provider will check areas that are affected by your nervous system (neurologic exam), such as your reflexes, how you move, and what you can feel. You may have other tests, such as:  Blood tests.  Removal and examination of fluid that surrounds the spinal cord (lumbar puncture).  CT scan.  MRI.  A test to check the nerves that control muscles (electromyogram, EMG).  Tests of how quickly messages pass through your nerves (nerve conduction velocity tests).  Removal of a small piece of nerve to be examined under a microscope (biopsy). Autonomic neuropathy You may have tests, such as:  Tests to measure your blood pressure and heart rate. This may include monitoring you while you are safely secured to an exam table that moves you from a lying position to an upright position (table tilt test).  Breathing tests to check your lungs.  Tests to check how food moves through the digestive system (gastric emptying tests).  Blood, sweat, or urine tests.  Ultrasound of your bladder.  Spinal fluid tests. Focal neuropathy This condition may be diagnosed with:  A neurologic exam.  CT scan.  MRI.  EMG.  Nerve conduction velocity tests. Proximal neuropathy There is no test to diagnose this type of neuropathy. You may have tests to rule out other possible causes of this type of neuropathy. Tests may include:  X-rays of your spine and lumbar region.  Lumbar puncture.  MRI. How is this treated? The goal of treatment is to keep nerve damage from getting  worse. The most important part of treatment is keeping your blood glucose level and your A1C level within your target range by following your diabetes management plan. Over time, maintaining lower blood glucose levels helps lessen symptoms. In some cases, you may need prescription pain medicine. Follow these instructions at home:  Lifestyle   Do not use any products that contain nicotine or tobacco, such as cigarettes and e-cigarettes. If you need help quitting, ask your health care provider.  Be physically active every day. Include strength training and balance exercises.  Follow a healthy meal plan.  Work with your health care provider to manage your blood pressure. General instructions  Follow your diabetes management plan as directed. ? Check your blood glucose levels as directed by your health care provider. ? Keep your blood glucose in your target range as directed by your health care provider. ? Have your A1C level checked at least two times a year, or as often as told by your health care provider.  Take over the counter and prescription medicines only as told by your health care provider. This includes insulin and diabetes medicine.  Do not drive or use heavy machinery while taking prescription pain medicines.  Check your skin and feet every day for cuts, bruises, redness, blisters, or sores.  Keep all follow up visits as told by your health care provider. This is important. Contact a health care provider if:  You have burning, stabbing, or aching pain in your legs or feet.  You are unable to feel pressure or pain in your feet.  You develop problems with digestion, such as: ? Nausea. ? Vomiting. ? Bloating. ? Constipation. ? Diarrhea. ? Abdominal pain.  You have difficulty with urination, such as inability: ? To control when you urinate (incontinence). ? To completely empty the bladder (retention).  You have palpitations.  You feel dizzy, weak, or faint when you  stand up. Get help right away if:  You cannot urinate.  You have sudden weakness or loss of coordination.  You have trouble speaking.  You have pain or pressure in your chest.  You have an irregular heart beat.  You have sudden inability to move a part of your body. Summary  Diabetic neuropathy refers to nerve damage that is caused by diabetes. It can affect nerves throughout the entire body, causing numbness and pain in the arms, legs, digestive tract, heart, and other body systems.  Keep your blood glucose level and your blood pressure in your target range, as directed by your health care provider. This can help prevent neuropathy from getting worse.  Check your skin and feet every day for cuts, bruises, redness, blisters, or sores.  Do not use any products that contain nicotine or tobacco, such as cigarettes and e-cigarettes. If you need help quitting, ask your health care provider. This information is not intended to replace advice given to you by your health care provider. Make sure you discuss any questions you have with your health care provider. Document Released: 05/15/2001 Document Revised: 04/18/2017 Document Reviewed: 04/10/2016 Elsevier Interactive Patient Education  2019 [employer]  Onychomycosis/Fungal Toenails  WHAT IS IT? An infection that lies within the keratin of your nail plate that is caused by a fungus.  WHY ME? Fungal infections affect all ages, sexes, races, and creeds.  There may be many factors that predispose you to a fungal infection such as age, coexisting medical conditions such as diabetes, or an autoimmune disease; stress, medications, fatigue, genetics, etc.  Bottom line: fungus thrives in a warm, moist environment and your shoes offer such a location.  IS IT CONTAGIOUS? Theoretically, yes.  You do not want to share shoes, nail clippers or files with someone who has fungal toenails.  Walking around barefoot in the same room or sleeping in the  same bed is unlikely to transfer the organism.  It is important to realize, however, that fungus can spread easily from one nail to the next on the same foot.  HOW DO WE TREAT THIS?  There are several ways to treat this condition.  Treatment may depend on many factors such as age, medications, pregnancy, liver and kidney conditions, etc.  It is best to ask your doctor which options are available to you.  1. No treatment.   Unlike many other medical concerns, you can live with this condition.  However for many people this can be a painful condition and may lead to ingrown toenails or a bacterial infection.  It is recommended that you keep the nails cut short to help reduce the amount of fungal nail. 2. Topical treatment.  These range from herbal remedies to prescription strength nail lacquers.  About 40-50% effective, topicals require twice daily application for approximately 9 to 12 months or until an entirely new nail has grown out.  The most effective topicals are medical grade medications available through physicians offices. 3. Oral antifungal medications.  With an 80-90%  cure rate, the most common oral medication requires 3 to 4 months of therapy and stays in your system for a year as the new nail grows out.  Oral antifungal medications do require blood work to make sure it is a safe drug for you.  A liver function panel will be performed prior to starting the medication and after the first month of treatment.  It is important to have the blood work performed to avoid any harmful side effects.  In general, this medication safe but blood work is required. 4. Laser Therapy.  This treatment is performed by applying a specialized laser to the affected nail plate.  This therapy is noninvasive, fast, and non-painful.  It is not covered by insurance and is therefore, out of pocket.  The results have been very good with a 80-95% cure rate.  [facility] is the only practice in the area to offer this  therapy. 5. Permanent Nail Avulsion.  Removing the entire nail so that a new nail will not grow back. 

## 2018-05-24 ENCOUNTER — Ambulatory Visit (HOSPITAL_COMMUNITY)
Admission: RE | Admit: 2018-05-24 | Discharge: 2018-05-24 | Disposition: A | Discharge status: Home / Self Care | Payer: Medicaid Other | Source: Ambulatory Visit | Attending: Neurology | Admitting: Neurology

## 2018-05-24 ENCOUNTER — Ambulatory Visit (HOSPITAL_BASED_OUTPATIENT_CLINIC_OR_DEPARTMENT_OTHER)
Admission: RE | Admit: 2018-05-24 | Discharge: 2018-05-24 | Disposition: A | Discharge status: Home / Self Care | Payer: Medicaid Other | Source: Ambulatory Visit | Attending: Neurology | Admitting: Neurology

## 2018-05-24 DIAGNOSIS — I6300 Cerebral infarction due to thrombosis of unspecified precerebral artery: Secondary | ICD-10-CM

## 2018-05-24 NOTE — Progress Notes (Signed)
Bilateral carorid duplex completed Preliminary results in Chart review CV Proc. [facility], RVS\05/24/2018, 3:46 PM

## 2018-05-24 NOTE — Progress Notes (Signed)
Transcranial Doppler and Carotid artery duplex completed. Refer to "CV Proc" under chart review to view preliminary results.  05/24/2018 1:42 PM Maudry Mayhew, MHA, RVT, RDCS, [place], [place]

## 2018-06-02 ENCOUNTER — Encounter: Payer: Self-pay | Admitting: Podiatry

## 2018-06-02 NOTE — Progress Notes (Signed)
Subjective: [name] presents today with history of neuropathy with cc of painful, mycotic toenails.  Pain is aggravated when wearing enclosed shoe gear and relieved with periodic professional debridement.  Patient has peripheral neuropathy managed with gabapentin.  Brenda Bill, MD is her PCP and last visit was 04/15/2018.  Her daughter is present during the visit and states she feels the right heel looks much better. Mom's caretaker has been floating her heels when in bed.   Current Outpatient Medications:  .  ACCU-CHEK AVIVA PLUS test strip, USE ONE STRIP TO TEST BEFORE MEALS AND AND AT BEDTIME, Disp: 100 each, Rfl: 5 .  ACCU-CHEK SOFTCLIX LANCETS lancets, FOR TEST DOSE BEFORE MEALS AND AT BEDTIME, Disp: 100 each, Rfl: 5 .  acetaminophen (TYLENOL) 325 MG tablet, Take 1-2 tablets (325-650 mg total) by mouth every 4 (four) hours as needed for mild pain (available over the counter)., Disp: , Rfl:  .  amLODipine (NORVASC) 10 MG tablet, Take 1 tablet (10 mg total) by mouth daily., Disp: 30 tablet, Rfl: 5 .  aspirin 325 MG tablet, Take 325 mg by mouth daily. Reported on 09/25/2015, Disp: , Rfl:  .  atorvastatin (LIPITOR) 20 MG tablet, Take 1 tablet (20 mg total) by mouth daily., Disp: 30 tablet, Rfl: 5 .  baclofen (LIORESAL) 10 MG tablet, TAKE ONE (1) TABLET BY MOUTH TWO (2) TIMES DAILY TO HELP WITH TONE/SPASMS, Disp: 60 each, Rfl: 2 .  benzonatate (TESSALON) 100 MG capsule, Take 1 capsule (100 mg total) by mouth every 8 (eight) hours., Disp: 21 capsule, Rfl: 0 .  betamethasone valerate ointment (VALISONE) 0.1 %, Apply topically., Disp: , Rfl:  .  cetirizine (ZYRTEC) 10 MG tablet, TAKE ONE (1) TABLET BY MOUTH EVERY DAY, Disp: 30 tablet, Rfl: 3 .  gabapentin (NEURONTIN) 100 MG capsule, Take 1 capsule (100 mg total) by mouth 3 (three) times daily., Disp: 90 capsule, Rfl: 5 .  glucose blood test strip, USE TO TEST BLOOD SUGARS BEFORE MEALS AND AT BEDTIME, Disp: , Rfl:  .   hydrochlorothiazide (HYDRODIURIL) 25 MG tablet, TAKE ONE (1) TABLET BY MOUTH EVERY DAY, Disp: 30 tablet, Rfl: 5 .  ibuprofen (ADVIL,MOTRIN) 400 MG tablet, Take 1 tablet (400 mg total) by mouth every 6 (six) hours as needed., Disp: 30 tablet, Rfl: 0 .  insulin aspart (NOVOLOG) 100 UNIT/ML injection, 0-12 units as per sliding scale, Disp: 10 mL, Rfl: 3 .  LANTUS SOLOSTAR 100 UNIT/ML Solostar Pen, INJECT 8 UNITS INTO THE SKIN DAILY. ADJUST TO 16 UNITS DAILY AS DIRECTED, Disp: , Rfl:  .  lubiprostone (AMITIZA) 8 MCG capsule, Take 1 capsule (8 mcg total) by mouth 2 (two) times daily with a meal., Disp: 60 capsule, Rfl: 11 .  metoprolol (LOPRESSOR) 100 MG tablet, Take 1.5 tablets (150 mg total) by mouth 2 (two) times daily., Disp: 90 tablet, Rfl: 5 .  nystatin (MYCOSTATIN/NYSTOP) powder, Apply topically 2 (two) times daily., Disp: 45 g, Rfl: 0 .  polyethylene glycol powder (GLYCOLAX/MIRALAX) powder, Take 17 g by mouth daily as needed (constipation)., Disp: 527 g, Rfl: 3 .  protein supplement shake (PREMIER PROTEIN) LIQD, Take 2 oz by mouth daily., Disp: , Rfl:  .  senna (SENOKOT) 8.6 MG tablet, Take by mouth., Disp: , Rfl:  .  Spacer/Aero-Holding Chambers (E-Z SPACER) inhaler, Use as instructed, Disp: 1 each, Rfl: 2 .  UNIFINE PENTIPS 31G X 6 MM MISC, USE AS PRESCRIBED BY PHYSICIAN, Disp: 100 each, Rfl: 11  No Known Allergies  Objective:  Vascular Examination: Capillary refill time <3 seconds x 10 digits.  Dorsalis pedis 0/4 b/l.  Posterior tibial pulses 1/4 b/l.  Digital hair x 10 digits was absent.  Skin temperature gradient WNL b/l.  +2 pitting edema RLE  Dermatological Examination: Skin with normal turgor, texture and tone b/l.  Toenails 1-5 b/l discolored, thick, dystrophic with subungual debris and pain with palpation to nailbeds due to thickness of nails.  Resolved area of pressure right posterior heel. Dry, flaky skin from resolved pressure.  Musculoskeletal: Muscle strength  5/5 to all muscle groups LLE  RLE 0/5   Neurological: Sensation with 10 gram monofilament is absent b/l.  Vibratory sensation absent b/l.  Assessment: 1. Painful onychomycosis toenails 1-5 b/l 2. Pressure injury right heel, unstageable  3. NIDDM with neuropathy  Plan: 1. Toenails 1-5 b/l were debrided in length and girth without iatrogenic bleeding. 2. Continue floating heels daily when in bed. 3. Patient to continue soft, supportive shoe gear. 4. Patient to report any pedal injuries to medical professional  5. Follow up 3 months.  6. Patient/POA to call should there be a concern in the interim.

## 2018-06-03 ENCOUNTER — Telehealth: Payer: Self-pay

## 2018-06-03 NOTE — Telephone Encounter (Signed)
I called pts daughter about TCD doppler result.The TCD doppler was suboptimal due to poor bony windows from thick skull. No significant abnormality or worrisome findings noted. She verbalized understanding. ------

## 2018-06-03 NOTE — Telephone Encounter (Signed)
I called [name] on dpr about VAS carotid doppler. I stated it shows no significant blockages of the major blood vessels in the neck on either side. The daughter verbalized understanding. ------

## 2018-06-03 NOTE — Telephone Encounter (Signed)
-----   Message from Garvin Fila, MD sent at 05/30/2018  5:41 PM EDT ----- Mitchell Heir inform the patient that transcranial Doppler study was suboptimal due to poor bony windows from thick skull.  No significant abnormality or worrisome finding noted

## 2018-06-10 ENCOUNTER — Ambulatory Visit: Payer: Medicaid Other

## 2018-07-10 ENCOUNTER — Ambulatory Visit: Payer: Medicaid Other

## 2018-07-19 DIAGNOSIS — Z01818 Encounter for other preprocedural examination: Secondary | ICD-10-CM | POA: Insufficient documentation

## 2018-07-24 ENCOUNTER — Other Ambulatory Visit: Payer: Self-pay

## 2018-07-24 ENCOUNTER — Emergency Department (HOSPITAL_COMMUNITY): Payer: Medicaid Other

## 2018-07-24 ENCOUNTER — Emergency Department (HOSPITAL_COMMUNITY)
Admission: EM | Admit: 2018-07-24 | Discharge: 2018-07-24 | Disposition: A | Discharge status: Home / Self Care | Payer: Medicaid Other | Attending: Emergency Medicine | Admitting: Emergency Medicine

## 2018-07-24 DIAGNOSIS — E119 Type 2 diabetes mellitus without complications: Secondary | ICD-10-CM | POA: Insufficient documentation

## 2018-07-24 DIAGNOSIS — I10 Essential (primary) hypertension: Secondary | ICD-10-CM | POA: Insufficient documentation

## 2018-07-24 DIAGNOSIS — Z79899 Other long term (current) drug therapy: Secondary | ICD-10-CM | POA: Insufficient documentation

## 2018-07-24 DIAGNOSIS — Z794 Long term (current) use of insulin: Secondary | ICD-10-CM | POA: Diagnosis not present

## 2018-07-24 DIAGNOSIS — Z8673 Personal history of transient ischemic attack (TIA), and cerebral infarction without residual deficits: Secondary | ICD-10-CM | POA: Insufficient documentation

## 2018-07-24 DIAGNOSIS — I639 Cerebral infarction, unspecified: Secondary | ICD-10-CM

## 2018-07-24 DIAGNOSIS — R55 Syncope and collapse: Secondary | ICD-10-CM | POA: Diagnosis present

## 2018-07-24 DIAGNOSIS — E860 Dehydration: Secondary | ICD-10-CM | POA: Diagnosis not present

## 2018-07-24 DIAGNOSIS — R569 Unspecified convulsions: Secondary | ICD-10-CM | POA: Diagnosis not present

## 2018-07-24 DIAGNOSIS — R4020 Unspecified coma: Secondary | ICD-10-CM

## 2018-07-24 LAB — DIFFERENTIAL
Abs Immature Granulocytes: 0.05 10*3/uL (ref 0.00–0.07)
Basophils Absolute: 0 10*3/uL (ref 0.0–0.1)
Basophils Relative: 0 %
Eosinophils Absolute: 0 10*3/uL (ref 0.0–0.5)
Eosinophils Relative: 0 %
Immature Granulocytes: 0 %
Lymphocytes Relative: 13 %
Lymphs Abs: 1.6 10*3/uL (ref 0.7–4.0)
Monocytes Absolute: 0.6 10*3/uL (ref 0.1–1.0)
Monocytes Relative: 5 %
Neutro Abs: 9.9 10*3/uL — ABNORMAL HIGH (ref 1.7–7.7)
Neutrophils Relative %: 82 %

## 2018-07-24 LAB — COMPREHENSIVE METABOLIC PANEL
ALT: 17 U/L (ref 0–44)
AST: 21 U/L (ref 15–41)
Albumin: 4 g/dL (ref 3.5–5.0)
Alkaline Phosphatase: 79 U/L (ref 38–126)
Anion gap: 17 — ABNORMAL HIGH (ref 5–15)
BUN: 17 mg/dL (ref 6–20)
CO2: 22 mmol/L (ref 22–32)
Calcium: 9.1 mg/dL (ref 8.9–10.3)
Chloride: 98 mmol/L (ref 98–111)
Creatinine, Ser: 1.26 mg/dL — ABNORMAL HIGH (ref 0.44–1.00)
GFR calc Af Amer: 56 mL/min — ABNORMAL LOW (ref >60)
GFR calc non Af Amer: 48 mL/min — ABNORMAL LOW (ref >60)
Glucose, Bld: 204 mg/dL — ABNORMAL HIGH (ref 70–99)
Potassium: 3.1 mmol/L — ABNORMAL LOW (ref 3.5–5.1)
Sodium: 137 mmol/L (ref 135–145)
Total Bilirubin: 0.4 mg/dL (ref 0.3–1.2)
Total Protein: 8.4 g/dL — ABNORMAL HIGH (ref 6.5–8.1)

## 2018-07-24 LAB — CBC
HCT: 42.3 % (ref 36.0–46.0)
Hemoglobin: 13.9 g/dL (ref 12.0–15.0)
MCH: 29.6 pg (ref 26.0–34.0)
MCHC: 32.9 g/dL (ref 30.0–36.0)
MCV: 90.2 fL (ref 80.0–100.0)
Platelets: 273 10*3/uL (ref 150–400)
RBC: 4.69 MIL/uL (ref 3.87–5.11)
RDW: 12.2 % (ref 11.5–15.5)
WBC: 12.2 10*3/uL — ABNORMAL HIGH (ref 4.0–10.5)
nRBC: 0 % (ref 0.0–0.2)

## 2018-07-24 LAB — I-STAT BETA HCG BLOOD, ED (MC, WL, AP ONLY): I-stat hCG, quantitative: <5 m[IU]/mL (ref <5)

## 2018-07-24 LAB — PROTIME-INR
INR: 1.1 (ref 0.8–1.2)
Prothrombin Time: 13.7 seconds (ref 11.4–15.2)

## 2018-07-24 LAB — APTT: aPTT: 26 seconds (ref 24–36)

## 2018-07-24 LAB — ETHANOL: Alcohol, Ethyl (B): <10 mg/dL (ref <10)

## 2018-07-24 LAB — TROPONIN I: Troponin I: <0.03 ng/mL (ref <0.03)

## 2018-07-24 LAB — I-STAT CREATININE, ED: Creatinine, Ser: 1.1 mg/dL — ABNORMAL HIGH (ref 0.44–1.00)

## 2018-07-24 LAB — CBG MONITORING, ED: Glucose-Capillary: 189 mg/dL — ABNORMAL HIGH (ref 70–99)

## 2018-07-24 MED ORDER — LEVETIRACETAM 500 MG PO TABS: 500.0000 mg | ORAL_TABLET | Freq: Two times a day (BID) | ORAL | 0 refills | Status: DC

## 2018-07-24 MED ORDER — SODIUM CHLORIDE 0.9 % IV BOLUS
500.0000 mL | Freq: Once | INTRAVENOUS | Status: AC
  Administered 2018-07-24: 09:00:00 500 mL via INTRAVENOUS

## 2018-07-24 MED ORDER — LEVETIRACETAM IN NACL 1000 MG/100ML IV SOLN
1000.0000 mg | Freq: Once | INTRAVENOUS | Status: AC
  Administered 2018-07-24: 1000 mg via INTRAVENOUS
  Filled 2018-07-24: qty 100

## 2018-07-24 MED ORDER — IOHEXOL 350 MG/ML SOLN
75.0000 mL | Freq: Once | INTRAVENOUS | Status: AC | PRN
  Administered 2018-07-24: 07:00:00 75 mL via INTRAVENOUS

## 2018-07-24 MED ORDER — SODIUM CHLORIDE 0.9 % IV BOLUS
500.0000 mL | Freq: Once | INTRAVENOUS | Status: AC
  Administered 2018-07-24: 08:00:00 500 mL via INTRAVENOUS

## 2018-07-24 NOTE — Consult Note (Addendum)
Neurology Consultation  Reason for Consult: code stroke - right sided weakness Referring Physician: Dr Dina Rich  CC: Right facial weakness, code stroke  History is obtained from: Chart review  HPI: [name] is a 58 y.o. 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  She started to come to and refused to go to the hospital in an ambulance.  The family drove her to the hospital and she kept refusing to go down from the wheelchair.  Upon questioning by the ED providers on a screening exam upfront, she said that she does have some headache and feels more weakness on the right face.  Because of the acute onset of symptoms, code stroke was activated. The daughter reports that she did not feel worsening right lower facial weakness that she has baseline right lower facial weakness. Her last neurological note from February 2020 listed her NIH stroke scale of 13 and a modified Rankin score of 4. The daughter reports that a prior similar episode of loss of consciousness was suspected to be a seizure but she is not on any antiepileptics at this time. She was supposed to go for a D&C procedure today   LKW: 3 AM on 07/24/2018 tpa given?: no, not likely stroke, most likely seizure Premorbid modified Rankin scale (mRS): 4  ROS: ROS was performed and is negative except as noted in the HPI.    Past Medical History:  Diagnosis Date  . Aphasia due to stroke   . Bipolar affective disorder ([place])   . Depression   . Diabetes mellitus ([place])    ?type (05/29/2013)  . Episodic confusion   .  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 Hypertension   . Schizophrenia ([place])    [name] 11/28/2007 (05/29/2013)  . Seizures ([place])   . Stroke [facility]) 05/27/2013   Family History  Problem Relation Age of Onset  . Hypertension Mother   . Stroke Father   . Hypertension Father   . Diabetes Father    Social History:   reports that she has never smoked. She has never used smokeless tobacco. She reports that she does not drink alcohol or use drugs.  Medications  Current Facility-Administered Medications:  .  sodium chloride 0.9 % bolus 500 mL, 500 mL, Intravenous, Once, Carlisle Cater, PA-C  Current Outpatient Medications:  .  ACCU-CHEK AVIVA PLUS test strip, USE ONE STRIP TO TEST BEFORE MEALS AND AND AT BEDTIME, Disp: 100 each, Rfl: 5 .  ACCU-CHEK SOFTCLIX LANCETS lancets, FOR TEST DOSE BEFORE MEALS AND AT BEDTIME, Disp: 100 each, Rfl: 5 .  acetaminophen (TYLENOL) 325 MG tablet, Take 1-2 tablets (325-650 mg total) by mouth every 4 (four) hours as needed for mild pain (available over the counter)., Disp: , Rfl:  .  amLODipine (NORVASC) 10 MG tablet, Take 1 tablet (10 mg total) by mouth daily., Disp: 30 tablet, Rfl: 5 .  aspirin 325 MG tablet, Take 325 mg by mouth daily. Reported on 09/25/2015, Disp: , Rfl:  .  atorvastatin (LIPITOR) 20 MG tablet, Take 1 tablet (20 mg total) by mouth daily., Disp: 30  tablet, Rfl: 5 .  baclofen (LIORESAL) 10 MG tablet, TAKE ONE (1) TABLET BY MOUTH TWO (2) TIMES DAILY TO HELP WITH TONE/SPASMS, Disp: 60 each, Rfl: 2 .  benzonatate (TESSALON) 100 MG capsule, Take 1 capsule (100 mg total) by mouth every 8 (eight) hours., Disp: 21 capsule, Rfl: 0 .  betamethasone valerate ointment (VALISONE) 0.1 %, Apply topically., Disp: , Rfl:  .  cetirizine (ZYRTEC) 10 MG tablet, TAKE ONE (1) TABLET BY MOUTH EVERY DAY, Disp: 30 tablet, Rfl: 3 .  gabapentin (NEURONTIN) 100 MG capsule, Take 1 capsule (100 mg total) by mouth 3 (three) times daily., Disp: 90  capsule, Rfl: 5 .  glucose blood test strip, USE TO TEST BLOOD SUGARS BEFORE MEALS AND AT BEDTIME, Disp: , Rfl:  .  hydrochlorothiazide (HYDRODIURIL) 25 MG tablet, TAKE ONE (1) TABLET BY MOUTH EVERY DAY, Disp: 30 tablet, Rfl: 5 .  ibuprofen (ADVIL,MOTRIN) 400 MG tablet, Take 1 tablet (400 mg total) by mouth every 6 (six) hours as needed., Disp: 30 tablet, Rfl: 0 .  insulin aspart (NOVOLOG) 100 UNIT/ML injection, 0-12 units as per sliding scale, Disp: 10 mL, Rfl: 3 .  LANTUS SOLOSTAR 100 UNIT/ML Solostar Pen, INJECT 8 UNITS INTO THE SKIN DAILY. ADJUST TO 16 UNITS DAILY AS DIRECTED, Disp: , Rfl:  .  lubiprostone (AMITIZA) 8 MCG capsule, Take 1 capsule (8 mcg total) by mouth 2 (two) times daily with a meal., Disp: 60 capsule, Rfl: 11 .  metoprolol (LOPRESSOR) 100 MG tablet, Take 1.5 tablets (150 mg total) by mouth 2 (two) times daily., Disp: 90 tablet, Rfl: 5 .  nystatin (MYCOSTATIN/NYSTOP) powder, Apply topically 2 (two) times daily., Disp: 45 g, Rfl: 0 .  polyethylene glycol powder (GLYCOLAX/MIRALAX) powder, Take 17 g by mouth daily as needed (constipation)., Disp: 527 g, Rfl: 3 .  protein supplement shake (PREMIER PROTEIN) LIQD, Take 2 oz by mouth daily., Disp: , Rfl:  .  senna (SENOKOT) 8.6 MG tablet, Take by mouth., Disp: , Rfl:  .  Spacer/Aero-Holding Chambers (E-Z SPACER) inhaler, Use as instructed, Disp: 1 each, Rfl: 2 .  UNIFINE PENTIPS 31G X 6 MM MISC, USE AS PRESCRIBED BY PHYSICIAN, Disp: 100 each, Rfl: 11  Exam: Current vital signs: BP (!) 163/76 (BP Location: Left Arm)   Pulse (!) 111   Temp 98.7 F (37.1 C) (Oral)   Resp 19   Wt 78.2 kg   SpO2 95%   BMI 28.69 kg/m  Vital signs in last 24 hours: Temp:  [98.7 F (37.1 C)] 98.7 F (37.1 C) (05/06 0607) Pulse Rate:  [111] 111 (05/06 0607) Resp:  [19] 19 (05/06 0607) BP: (163)/(76) 163/76 (05/06 0607) SpO2:  [95 %] 95 % (05/06 0607) Weight:  [78.2 kg] 78.2 kg (05/06 0615) GENERAL: Awake, alert in NAD HEENT: - Normocephalic  and atraumatic, dry mm, no LN++, no Thyromegally LUNGS - Clear to auscultation bilaterally with no wheezes CV - S1S2 RRR, no m/r/g, equal pulses bilaterally. ABDOMEN - Soft, nontender, nondistended with normoactive BS Ext: warm, well perfused, intact peripheral pulses, no edema  NEURO:  Mental Status: Awake alert Language: Extremely limited language with may be yes or no as the words she could say, intermittently follows midline commands, cannot follow complex commands. Cranial Nerves: PERRL. EOMI, visual fields exam with possible less blink to threat from right, right lower facial weakness facial sensation intact,  tongue/uvula/soft palate midline, normal sternocleidomastoid and trapezius muscle strength. No evidence of tongue atrophy or fibrillations Motor: Right upper extremity with  contracture and 2/5 proximal strength and 0/5 distal strength.  Right lower extremity proximally 4-/5 and distally 3/5 with right foot drop.  Increased tone in the right lower extremity as well.  Left side 5/5. Tone: As above Sensation- Intact to light touch bilaterally Coordination: No dysmetria on the right Gait- deferred NIHSS 1a Level of Conscious.: 0 1b LOC Questions: 2 1c LOC Commands: 0 2 Best Gaze: 0 3 Visual: 1 4 Facial Palsy: 2 5a Motor Arm - left: 0 5b Motor Arm - Right:2  6a Motor Leg - Left: 0 6b Motor Leg - Right: 1 7 Limb Ataxia: 0 8 Sensory: 0 9 Best Language: 2 10 Dysarthria: 1 11 Extinct. and Inatten.: 0 TOTAL: 11  Labs I have reviewed labs in epic and the results pertinent to this consultation are:  CBC    Component Value Date/Time   WBC 9.3 03/28/2016 0525   RBC 4.46 03/28/2016 0525   HGB 13.5 03/28/2016 0525   HCT 40.8 03/28/2016 0525   PLT 233 03/28/2016 0525   MCV 91.5 03/28/2016 0525   MCH 30.3 03/28/2016 0525   MCHC 33.1 03/28/2016 0525   RDW 12.1 03/28/2016 0525   LYMPHSABS 3.0 06/15/2015 1619   MONOABS 0.2 06/15/2015 1619   EOSABS 0.1 06/15/2015 1619    BASOSABS 0.0 06/15/2015 1619    CMP     Component Value Date/Time   NA 140 03/28/2016 0525   K 3.5 03/28/2016 0525   CL 103 03/28/2016 0525   CO2 26 03/28/2016 0525   GLUCOSE 153 (H) 03/28/2016 0525   BUN 19 03/28/2016 0525   CREATININE 1.03 (H) 03/28/2016 0525   CREATININE 1.00 01/27/2016 0912   CALCIUM 9.4 03/28/2016 0525   PROT 8.1 03/28/2016 0525   ALBUMIN 3.9 03/28/2016 0525   AST 22 03/28/2016 0525   ALT 19 03/28/2016 0525   ALKPHOS 76 03/28/2016 0525   BILITOT 0.6 03/28/2016 0525   GFRNONAA >60 03/28/2016 0525   GFRNONAA 64 01/27/2016 0912   GFRAA >60 03/28/2016 0525   GFRAA 74 01/27/2016 0912   Imaging I have reviewed the images obtained: CT-scan of the brain-large chronic LMCA stroke, no bleed, no acute changes CTA-shows severe left M1 stenosis with decreased flow past the left M1, has been reported in the past as well.  Assessment: 58 year old woman past history of large left MCA stroke with residual aphasia, right hemiparesis diabetes hypertension and possible seizure disorder coming in for evaluation of sudden onset of "passing out" followed by some worsening right-sided weakness and symptoms rapidly improving. Exam is unchanged from the last documented exam from February 2020. I suspect that she has had a seizure given that there was an episode in the past that it happened concerning for seizure where she was never started on an antiepileptic.  Impression: Unresponsiveness followed by worsening right-sided weakness-likely seizure History of a similar episode in the past concerning for seizure. History of old right MCA stroke with spastic right hemiparesis and right facial droop  Recommendations: --Load with Keppra 1 g IV now --Start Keppra 500 twice daily --Maintain seizure precautions --I do not see a need for admitting her since she is back to baseline.  She can get an EEG as an outpatient on a nonurgent basis. --Outpatient imaging in the form of MRI can  also be repeated at some point but there is no urgent necessity to do that at this time. --From a stroke prevention standpoint, continue aspirin and statin. --Follow-up with outpatient neurology in 4 to  6 weeks after discharge  -- Amie Portland, MD [facility] Pager: [phone] If 7pm to 7am, please call on call as listed on AMION.  Per [facility] statutes, patients with seizures are not allowed to drive until they have been seizure-free for six months.   Use caution when using heavy equipment or power tools. Avoid working on ladders or at heights. Take showers instead of baths. Ensure the water temperature is not too high on the home water heater. Do not go swimming alone. Do not lock yourself in a room alone (i.e. bathroom). When caring for infants or small children, sit down when holding, feeding, or changing them to minimize risk of injury to the child in the event you have a seizure. Maintain good sleep hygiene. Avoid alcohol.   If patient has another seizure, call 911 and bring them back to the ED if: A. The seizure lasts longer than 5 minutes.  B. The patient doesn't wake shortly after the seizure or has new problems such as difficulty seeing, speaking or moving following the seizure C. The patient was injured during the seizure D. The patient has a temperature over 102 F (39C) E. The patient vomited during the seizure and now is having trouble breathing

## 2018-07-24 NOTE — ED Provider Notes (Signed)
[facility] EMERGENCY DEPARTMENT Provider Note   CSN: [id number] Arrival date & time: 07/24/18  0551    History   Chief Complaint Chief Complaint  Patient presents with  . Loss of Consciousness    HPI [name] is a 58 y.o. female.     Patient with history of CVA with dense right-sided weakness and expressive aphasia, schizophrenia --presents the emergency department today after an episode of syncope.  Patient awoke at approximately 3:15 AM.  Patient was given misoprostol for a hysteroscopy scheduled for today.  Daughter was helping to give an antiseptic sponge bath at approximately 5:15 AM.  Patient seemed to be uncomfortable in her lower abdomen.  She also was complaining of a headache. Daughter noted onset of R facial droop.  Shortly afterwards she lost consciousness and was slumped over in the chair she was sitting in.  She was unable to be woken up for several minutes.  During that time, daughter called EMS.  Upon EMS arrival she was slowly returning to normal.  Daughter did note at that time that her right face seemed to be weak.  She states that typically her smile is even and at this is different than her baseline.  She is brought to the emergency department for further evaluation.  Currently patient denies any headache.  She denies any vision change.  Her left-sided weakness and expressive aphasia seem to be at baseline.  Patient is on aspirin and is not on any other anticoagulants.  She denies any chest pain or shortness of breath at any time.  She did have an episode of vomiting after her syncopal episode. The onset of this condition was acute. The course is constant. Aggravating factors: none. Alleviating factors: none.       Past Medical History:  Diagnosis Date  . Aphasia due to stroke   . Bipolar affective disorder ([place])   . Depression   . Diabetes mellitus ([place])    ?type (05/29/2013)  . Episodic confusion   . Hemiplegia affecting dominant side,  post-stroke (HCC)    right side  . Hypertension   . Schizophrenia ([place])    [name] 11/28/2007 (05/29/2013)  . Seizures ([place])   . Stroke [facility]) 05/27/2013    Patient Active Problem List   Diagnosis Date Noted  . Postmenopausal bleeding 03/07/2018  . Mixed hyperlipidemia 10/03/2017  . Chronic edema 09/25/2016  . Lymphedema of right lower extremity 08/09/2016  . Hemiparesis, aphasia, and dysphagia as late effect of cerebrovascular accident (CVA) ([place]) 06/23/2016  . Seizures ([place]) 09/29/2015  . Urine leukocytes 01/08/2015  . Expressive aphasia 01/08/2015  . Malnutrition of moderate degree ([place]) 11/11/2014  . Syncope 11/08/2014  . Dehydration 11/08/2014  . Staring spell 07/09/2014  . Adhesive capsulitis of right shoulder 06/04/2014  . Palpitations 04/02/2014  . Essential hypertension 01/05/2014  . DM (diabetes mellitus) ([place]) 01/05/2014  . Paranoid schizophrenia ([place]) 12/28/2013  . Cerebral embolism with cerebral infarction ([place]) 12/05/2013  . Sinus tachycardia 12/05/2013  . 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 Spastic hemiplegia affecting dominant side ([place]) 09/12/2013  . CVA (cerebral infarction) 06/04/2013  . Dysphagia 06/02/2013  . Benign polycythemia 06/02/2013  . Malignant hypertension 05/28/2013  . DM (diabetes mellitus) type II controlled, neurological manifestation ([place]) 05/27/2013  . Acute embolic stroke ([place]) 11/94/1740  . Aphasia 05/27/2013  . 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 Hypertensive emergency 05/27/2013  . Stroke [facility])  05/27/2013  . Aphasia complicating stroke 35/57/3220    Past Surgical History:  Procedure Laterality Date  . BREAST BIOPSY Right   . CESAREAN SECTION  ~ 75  . TEE WITHOUT CARDIOVERSION N/A 12/08/2013   Procedure: TRANSESOPHAGEAL ECHOCARDIOGRAM (TEE);  Surgeon: Candee Furbish, MD;  Location: [facility] ENDOSCOPY;  Service: Cardiovascular;   Laterality: N/A;  . TUBAL LIGATION       OB History   No obstetric history on file.      Home Medications    Prior to Admission medications   Medication Sig Start Date End Date Taking? Authorizing Provider  ACCU-CHEK AVIVA PLUS test strip USE ONE STRIP TO TEST BEFORE MEALS AND AND AT BEDTIME 03/21/16   Jegede, Marlena Clipper, MD  ACCU-CHEK SOFTCLIX LANCETS lancets FOR TEST DOSE BEFORE MEALS AND AT BEDTIME 04/24/16   Charlott Rakes, MD  acetaminophen (TYLENOL) 325 MG tablet Take 1-2 tablets (325-650 mg total) by mouth every 4 (four) hours as needed for mild pain (available over the counter). 07/08/13   Love, Ivan Anchors, PA-C  amLODipine (NORVASC) 10 MG tablet Take 1 tablet (10 mg total) by mouth daily. 01/20/16   Charlott Rakes, MD  aspirin 325 MG tablet Take 325 mg by mouth daily. Reported on 09/25/2015    [provider]  atorvastatin (LIPITOR) 20 MG tablet Take 1 tablet (20 mg total) by mouth daily. 01/20/16   Charlott Rakes, MD  baclofen (LIORESAL) 10 MG tablet TAKE ONE (1) TABLET BY MOUTH TWO (2) TIMES DAILY TO HELP WITH TONE/SPASMS 04/25/16   Charlott Rakes, MD  benzonatate (TESSALON) 100 MG capsule Take 1 capsule (100 mg total) by mouth every 8 (eight) hours. 11/11/16   Khatri, Hina, PA-C  betamethasone valerate ointment (VALISONE) 0.1 % Apply topically. 07/23/17   [provider]  cetirizine (ZYRTEC) 10 MG tablet TAKE ONE (1) TABLET BY MOUTH EVERY DAY 06/23/16   Charlott Rakes, MD  gabapentin (NEURONTIN) 100 MG capsule Take 1 capsule (100 mg total) by mouth 3 (three) times daily. 01/20/16   Charlott Rakes, MD  glucose blood test strip USE TO TEST BLOOD SUGARS BEFORE MEALS AND AT BEDTIME 02/15/18   [provider]  hydrochlorothiazide (HYDRODIURIL) 25 MG tablet TAKE ONE (1) TABLET BY MOUTH EVERY DAY 01/20/16   Charlott Rakes, MD  ibuprofen (ADVIL,MOTRIN) 400 MG tablet Take 1 tablet (400 mg total) by mouth every 6 (six) hours as needed. 11/11/16   Khatri, Hina, PA-C   insulin aspart (NOVOLOG) 100 UNIT/ML injection 0-12 units as per sliding scale 04/25/16   Newlin, Enobong, MD  LANTUS SOLOSTAR 100 UNIT/ML Solostar Pen INJECT 8 UNITS INTO THE SKIN DAILY. ADJUST TO 16 UNITS DAILY AS DIRECTED 05/14/18   [provider]  lubiprostone (AMITIZA) 8 MCG capsule Take 1 capsule (8 mcg total) by mouth 2 (two) times daily with a meal. 10/03/16   Nandigam, Venia Minks, MD  metoprolol (LOPRESSOR) 100 MG tablet Take 1.5 tablets (150 mg total) by mouth 2 (two) times daily. 01/20/16   Charlott Rakes, MD  nystatin (MYCOSTATIN/NYSTOP) powder Apply topically 2 (two) times daily. 06/24/16   Wynona Luna, MD  polyethylene glycol powder [facility]) powder Take 17 g by mouth daily as needed (constipation). 02/22/15   Lance Bosch, NP  protein supplement shake (PREMIER PROTEIN) LIQD Take 2 oz by mouth daily.    [provider]  senna (SENOKOT) 8.6 MG tablet Take by mouth. 06/23/16   [provider]  Spacer/Aero-Holding Chambers (E-Z SPACER) inhaler Use as instructed 06/18/15  Lance Bosch, NP  UNIFINE PENTIPS 31G X 6 MM MISC USE AS PRESCRIBED BY PHYSICIAN 03/16/15   Lance Bosch, NP    Family History Family History  Problem Relation Age of Onset  . Hypertension Mother   . Stroke Father   . Hypertension Father   . Diabetes Father     Social History Social History   Tobacco Use  . Smoking status: Never Smoker  . Smokeless tobacco: Never Used  Substance Use Topics  . Alcohol use: No    Alcohol/week: 0.0 standard drinks  . Drug use: No     Allergies   Patient has no known allergies.   Review of Systems Review of Systems  Constitutional: Negative for fever.  HENT: Negative for congestion, dental problem, rhinorrhea and sinus pressure.   Eyes: Negative for photophobia, discharge, redness and visual disturbance.  Respiratory: Negative for shortness of breath.   Cardiovascular: Negative for chest pain.  Gastrointestinal:  Positive for abdominal pain. Negative for nausea and vomiting.  Musculoskeletal: Negative for gait problem, neck pain and neck stiffness.  Skin: Negative for rash.  Neurological: Positive for syncope, speech difficulty (baseline, unchanged), weakness (left upper and lower extremity weakness) and headaches. Negative for light-headedness and numbness.  Psychiatric/Behavioral: Negative for confusion.     Physical Exam Updated Vital Signs BP (!) 163/76 (BP Location: Left Arm)   Pulse (!) 111   Temp 98.7 F (37.1 C) (Oral)   Resp 19   SpO2 95%   Physical Exam Vitals signs and nursing note reviewed.  Constitutional:      Appearance: She is well-developed.  HENT:     Head: Normocephalic and atraumatic.     Right Ear: Tympanic membrane, ear canal and external ear normal.     Left Ear: Tympanic membrane, ear canal and external ear normal.     Nose: Nose normal.     Mouth/Throat:     Pharynx: Uvula midline.  Eyes:     General: Lids are normal.     Extraocular Movements:     Right eye: No nystagmus.     Left eye: No nystagmus.     Conjunctiva/sclera: Conjunctivae normal.     Pupils: Pupils are equal, round, and reactive to light.  Neck:     Musculoskeletal: Normal range of motion and neck supple.  Cardiovascular:     Rate and Rhythm: Normal rate and regular rhythm.  Pulmonary:     Effort: Pulmonary effort is normal.     Breath sounds: Normal breath sounds.  Abdominal:     Palpations: Abdomen is soft.     Tenderness: There is no abdominal tenderness.  Musculoskeletal:     Cervical back: She exhibits normal range of motion, no tenderness and no bony tenderness.  Skin:    General: Skin is warm and dry.  Neurological:     Mental Status: She is alert. Mental status is at baseline.     GCS: GCS eye subscore is 4. GCS verbal subscore is 5. GCS motor subscore is 6.     Cranial Nerves: Cranial nerve deficit present.     Sensory: No sensory deficit.     Motor: Weakness present.      Deep Tendon Reflexes: Reflexes are normal and symmetric.     Comments: R sided facial weakness. Normal tongue protrusion. Able to raise eyebrows symmetrically.   3/5 strength in L upper and lower extremity.       ED Treatments / Results  Labs (all labs ordered  are listed, but only abnormal results are displayed) Labs Reviewed  CBG MONITORING, ED - Abnormal; Notable for the following components:      Result Value   Glucose-Capillary 189 (*)    All other components within normal limits  ETHANOL  PROTIME-INR  APTT  CBC  DIFFERENTIAL  COMPREHENSIVE METABOLIC PANEL  RAPID URINE DRUG SCREEN, HOSP PERFORMED  URINALYSIS, ROUTINE W REFLEX MICROSCOPIC  TROPONIN I  I-STAT CREATININE, ED  I-STAT BETA HCG BLOOD, ED ([facility], [facility], [facility] ONLY)    EKG EKG Interpretation  Date/Time:  Wednesday Jul 24 2018 06:05:40 EDT Ventricular Rate:  112 PR Interval:    QRS Duration: 70 QT Interval:  324 QTC Calculation: 443 R Axis:   43 Text Interpretation:  Sinus tachycardia Probable left atrial enlargement Anterior infarct, old Confirmed by Thayer Jew [phone]) on 07/24/2018 6:07:05 AM   Radiology Ct Head Code Stroke Wo Contrast  Result Date: 07/24/2018 CLINICAL DATA:  Code stroke.  Right-sided facial droop EXAM: CT HEAD WITHOUT CONTRAST TECHNIQUE: Contiguous axial images were obtained from the base of the skull through the vertex without intravenous contrast. COMPARISON:  03/28/2016 FINDINGS: Brain: Large remote left MCA territory infarct with wallerian degeneration seen into the brainstem. No evidence of acute infarct. No hemorrhage, masslike finding or collection. Vascular: Atherosclerotic calcification. Skull: No acute finding Sinuses/Orbits: Negative Other: These results were communicated to Dr. Rory Percy at 6:36 amon 5/6/2020by text page via the [facility] messaging system. ASPECTS [facility] Stroke Program Early CT Score) Not scored given the chronic findings IMPRESSION: 1. No acute finding or change from  prior. 2. Large remote left MCA territory infarct. Electronically Signed   By: Monte Fantasia M.D.   On: 07/24/2018 06:37    Procedures Procedures (including critical care time)  Medications Ordered in ED Medications  sodium chloride 0.9 % bolus 500 mL (has no administration in time range)     Initial Impression / Assessment and Plan / ED Course  I have reviewed the triage vital signs and the nursing notes.  Pertinent labs & imaging results that were available during my care of the patient were reviewed by me and considered in my medical decision making (see chart for details).        Patient seen on arrival with Dr. Dina Rich.  Vital signs reviewed and are as follows: BP (!) 163/76 (BP Location: Left Arm)   Pulse (!) 111   Temp 98.7 F (37.1 C) (Oral)   Resp 19   SpO2 95%   After more detailed history, there is a question of new or at least worsened right-sided facial weakness.  Previous records demonstrate that patient did have some residual right-sided facial weakness.  Given this uncertainty, code stroke called to facilitate evaluation and imaging.  Work-up pending. EKG reviewed with Dr. Dina Rich.   6:41 AM Spoke with Dr. Rory Percy who has seen patient. Agrees this appears to be a syncopal vs seizure episode and her current deficits are her residuals from previous CVA. CTA head and neck ordered. Reccs starting Keppra. Appreciate reccs.   Work-up ongoing.   CRITICAL CARE Performed by: Carlisle Cater PA-C Total critical care time: 35 minutes Critical care time was exclusive of separately billable procedures and treating other patients. Critical care was necessary to treat or prevent imminent or life-threatening deterioration. Critical care was time spent personally by me on the following activities: development of treatment plan with patient and/or surrogate as well as nursing, discussions with consultants, evaluation of patient's response to treatment, examination of patient,  obtaining history from patient or surrogate, ordering and performing treatments and interventions, ordering and review of laboratory studies, ordering and review of radiographic studies, pulse oximetry and re-evaluation of patient's condition.  7:00 AM Handoff to oncoming provider at shift change.   Plan: Follow-up on neuro reccs, CT angio results, lab results, orthostatics.   If work-up okay and patient at baseline, anticipate d/c. If other problems uncovered, treat as appropriate.    Final Clinical Impressions(s) / ED Diagnoses   Final diagnoses:  Loss of consciousness [facility])     ED Discharge Orders    None       Carlisle Cater, PA-C 07/24/18 [id number]    Merryl Hacker, MD 07/29/18 [phone]

## 2018-07-24 NOTE — ED Notes (Signed)
Code stroke cancelled per neurologist

## 2018-07-24 NOTE — Discharge Instructions (Signed)
Continue taking home medications as prescribed. Start taking Keppra twice a day. Make sure you are staying well-hydrated water.  This is very important. Follow-up with neurology for further evaluation management. Return to the emergency room with any new, worsening, concerning symptoms.

## 2018-07-24 NOTE — ED Triage Notes (Addendum)
Pt arrives with daughter due to a syncope episode this morning while bathing. Pt daughter states while bathing her she noticed she was slumped over taking shallow breaths, with r face droop, not responding to voice or touch. + Vomiting. Daughter states episode lasted for around 5 mins. Patient does have history of stroke with residual right side weakness.

## 2018-07-24 NOTE — ED Notes (Signed)
Called carelink to activate code stroke @ 6:22 am

## 2018-07-24 NOTE — ED Provider Notes (Signed)
  Physical Exam  BP (!) 163/76 (BP Location: Left Arm)   Pulse (!) 111   Temp 98.7 F (37.1 C) (Oral)   Resp 19   Wt 78.2 kg   SpO2 95%   BMI 28.69 kg/m   Physical Exam  Gen: appears nontoxic CV: HR 95-100  ED Course/Procedures     Procedures  MDM   Patient signed out to me by Alvera Singh, PA-C.  Please see previous notes for further history.  In brief, patient presenting for evaluation of loss of consciousness today.  At 515 this morning, she was complaining of abdominal pain and a headache.  Per daughter, she had more than normal right sided facial drooping, and then lost consciousness for several minutes without being able to be awoken.  As such, EMS was called.  She returned to her baseline mental status with EMS.  On exam in the ED, patient unable to lift right side face, and code stroke was called.  CT and CTA were negative for changes, patient was evaluated by neurology, Dr. Rory Percy favors possible seizure.  Also consider vasovagal syncope.  Per neurology, recommend loading dose of thousand of Keppra, and recommend discharge on 500 Keppra twice daily with guilf neuro f/u.  Labs pending.  Patient heart rate mildly elevated at 111, likely dehydration.  Receiving fluids.  History of stroke in 2015, causing severe right-sided deficits at baseline.  Labs show slight dehydration. Potassium mildly low at 3.1.  Patient's daughter states she has been having diarrhea since she started the medication that she is supposed to take prior to surgery today.  She is been n.p.o. for same surgery.  As such, this is likely the cause for dehydration and hypokalemia. Pt given total of 1L IVF in the ED. Otherwise, labs reassuring. Discussed findings with pt and daughter. Discussed importance of f/u. At this time, pt appears safe for d/c. Return precautions given. Pt and daughter states they understand and agree to plan.      Franchot Heidelberg, PA-C 07/24/18 [id number]    Little, Wenda Overland, MD 07/24/18  1050

## 2018-07-25 ENCOUNTER — Telehealth: Payer: Self-pay | Admitting: Neurology

## 2018-07-25 NOTE — Telephone Encounter (Signed)
07-25-18 Pt daughter has called and has given verbal consent to file insurance of pt for VV [email or web address]  pt daughter's email: [email or web address]  Pt understands that although there may be some limitations with this type of visit, we will take all precautions to reduce any security or privacy concerns.  Pt understands that this will be treated like an in office visit and we will file with pt's insurance, and there may be a patient responsible charge related to this service.

## 2018-08-05 ENCOUNTER — Ambulatory Visit (INDEPENDENT_AMBULATORY_CARE_PROVIDER_SITE_OTHER): Payer: Medicaid Other | Admitting: Neurology

## 2018-08-05 ENCOUNTER — Telehealth: Payer: Self-pay | Admitting: Neurology

## 2018-08-05 ENCOUNTER — Other Ambulatory Visit: Payer: Self-pay

## 2018-08-05 DIAGNOSIS — Z09 Encounter for follow-up examination after completed treatment for conditions other than malignant neoplasm: Secondary | ICD-10-CM | POA: Diagnosis not present

## 2018-08-05 DIAGNOSIS — I6300 Cerebral infarction due to thrombosis of unspecified precerebral artery: Secondary | ICD-10-CM

## 2018-08-05 MED ORDER — LEVETIRACETAM 500 MG PO TABS: 500.0000 mg | ORAL_TABLET | Freq: Two times a day (BID) | ORAL | 0 refills | Status: DC

## 2018-08-05 MED ORDER — CLOPIDOGREL BISULFATE 75 MG PO TABS: 75.0000 mg | ORAL_TABLET | Freq: Every day | ORAL | 11 refills | Status: DC

## 2018-08-05 NOTE — Patient Instructions (Signed)
I had a long discussion with the patient and her daughter regarding altered consciousness and increased right-sided weakness and gait difficulty possible new left hemispheric infarct. Seizure.  Agree with continuing Keppra 500 twice daily for seizure prophylaxis and recommend adding Plavix 75 mg daily for 3 months followed by Plavix alone for stroke prevention.  Recommend home physical and occupational therapy for gait and balance.Strict control of hypertension with blood pressure goal below 130/90, diabetes with HbA1c goal below 6.5% and LDL cholesterol goal below 70 mg%.  Check MRI scan of the brain for interval new stroke and EEG for seizure activity. return for follow-up in 3 months

## 2018-08-05 NOTE — Progress Notes (Signed)
Virtual Visit via Video Note  I connected with [name] on 08/05/18 at  1:30 PM EDT by a video enabled telemedicine application and verified that I am speaking with the correct person using two identifiers.  Location: Patient: patient at her home Provider:at [place] office   I discussed the limitations of evaluation and management by telemedicine and the availability of in person appointments. The patient expressed understanding and agreed to proceed.  The patient's daughter was present throughout this visit and facilitated this visit.  History of Present Illness: [name] is seen today following recent visit to the emergency room 10 days ago for possible stroke versus seizure.  I have personally reviewed ER visit and imaging films in PACS.  She is a 58 year old lady with past medical history of large left MCA stroke with residual aphasia, right hemiparesis and poor functional baseline, diabetes, hypertension, suspected seizures was brought into emergency room on 07/24/2018 for a brief episode of loss of consciousness while bathing followed by worsening of her right-sided weakness.  She started to recover fairly quickly but initially refused to go to the hospital in an ambulance.  The family subsequently drove her to the hospital and she Refused refusing to get out from a wheelchair.  She complained of mild headache and increased weakness on the right side from her baseline.  Code stroke was activated.  She was seen by Dr. Rory Percy neuro hospitalist who felt she may have had a seizure and appeared to be postictal with transient worsening of her deficits.  CT scan of the head was obtained which showed chronic large left MCA infarct without any bleed or acute changes.  CT angiogram showed severe left M1 stenosis with attenuated flow pass the left M1 and 60% stenosis of carotid siphons bilaterally.  This is unchanged from prior studies from 05/20/2013.  Her NIH stroke scale was found to be 11 and modified  Rankin at baseline 4. Patient was loaded with Keppra and started on Keppra 500 twice daily for seizures.  EEG was not obtained but a previous EEG from 07/14/2014 had showed focal left hemispheric slowing with left frontotemporal irritability.  Patient has still not recovered back to her prior baseline as per the daughter.  She is still weaker on the right side.  She is still slow to respond.  She is able to communicate her needs but does not speak much.  She still requires 24-hour care at home.   Observations/Objective: Physical and neurological exam is limited due to constraints from video technology.  Pleasant middle-aged African-American lady who is mildly obese and not in distress.  She is awake alert has severe expressive greater than receptive aphasia speaks only occasional words which are difficult to comprehend.  She can understand simple midline and one-step commands.  Extraocular movements appear full range without nystagmus.  Fundi and pupils cannot be tested.  There is mild right lower facial asymmetry.  She appears to have spastic right hemiplegia with 1/5 right upper extremity and 2-3/5 right lower extremity strength.  She has purposeful antigravity movements on the left side.  Gait was deferred.  Assessment  58 year old lady with remote left MCA infarct in March 2015 with significant residual aphasia and right hemiplegia with vascular risk factors of hypertension, diabetes, intracranial stenosis, obesity and cerebrovascular disease.  Recent episode of transient unresponsiveness followed by right-sided weakness unclear as to partial seizure versus new stroke she has not returned to her baseline after nearly 2 weeks. PLAN : I had a  long discussion with the patient and her daughter regarding altered consciousness and increased right-sided weakness and gait difficulty possible new left hemispheric infarct versus  Seizure.  Agree with continuing Keppra 500 twice daily for seizure prophylaxis and  recommend adding Plavix 75 mg daily for 3 months followed by Plavix alone for stroke prevention.  Recommend home physical and occupational therapy for gait and balance.Strict control of hypertension with blood pressure goal below 130/90, diabetes with HbA1c goal below 6.5% and LDL cholesterol goal below 70 mg%.  Check MRI scan of the brain for interval new stroke and EEG for seizure activity. return for follow-up in 3 months Follow Up Instructions: Follow-up after EEG and MRI in 3 months.   I discussed the assessment and treatment plan with the patient. The patient was provided an opportunity to ask questions and all were answered. The patient agreed with the plan and demonstrated an understanding of the instructions.   The patient was advised to call back or seek an in-person evaluation if the symptoms worsen or if the condition fails to improve as anticipated.  I provided 25 minutes of non-face-to-face time during this encounter.   Brenda Contras, MD

## 2018-08-05 NOTE — Telephone Encounter (Signed)
Medicaid order sent to GI. They will obtain the auth and reach out to the pt to schedule.  °

## 2018-08-09 ENCOUNTER — Telehealth: Payer: Self-pay | Admitting: Neurology

## 2018-08-09 NOTE — Telephone Encounter (Signed)
Pt's daughter [name] on [place] states the pt has been complaining of headaches, pain when urinating, having aggressive behavior, agitation, nervousness, drowsiness and dizziness since she started the levETIRAcetam (KEPPRA) 500 MG tablet. Daughter would like the RN to call so she can discuss what can be done about this. Please advise.

## 2018-08-13 NOTE — Telephone Encounter (Signed)
Message sent to Dr. SEthi. 

## 2018-08-14 NOTE — Telephone Encounter (Signed)
This could be possible side effects with Keppra.  Recommend reduce the dose of Keppra to 250 mg twice daily

## 2018-08-15 NOTE — Telephone Encounter (Signed)
Pt daughter has called back, the message from [place] was read to daughter, she still has questions.  Daughter would like a call back from [place]

## 2018-08-15 NOTE — Telephone Encounter (Signed)
I called [name] about her concerns. She called about her taking plavix and aspirin for 3 months She look online and saw all of the side effects about nose bleedings and bleeding. I stated Dr.SEthi recommend aspirin and plavix for 3 months than plavix along. She read that pts can having bruising with the medication. I stated pts can have moderate bruising with taking aspriin and plavix. I stated once she finish the dapt therapy the bruising should decrease. I stated if her mom does have any nose bleeds to give [place] a call. She stated her mom has not started the plavix but she was calling to inquire about the side effects if they do occur. She verbalized understanding of plavix and aspirin.  I stated Dr.Sethi recommend pt ecrease keppra to 250mg  in the am and 250mg  in the pm due to her recent side effects of it. I stated to give it  7 to 10 days for the 500mg  dosage to be wean from her body. Also to allow 7 to 10 days for the lower to work. The daughter verbalized understanding and will call if she has any questions.

## 2018-08-15 NOTE — Telephone Encounter (Signed)
Left vm for [name] pts daughter that per Dr. Leonie Man he wants pt to take 250mg  in the am, and 250mg  pm. Left [facility] number if she had any questions.I stated pt could have possible side effects with keppra.

## 2018-08-20 ENCOUNTER — Encounter: Payer: Self-pay | Admitting: Podiatry

## 2018-08-20 ENCOUNTER — Other Ambulatory Visit: Payer: Self-pay

## 2018-08-20 ENCOUNTER — Ambulatory Visit: Payer: Medicaid Other | Admitting: Podiatry

## 2018-08-20 VITALS — Temp 97.9°F

## 2018-08-20 DIAGNOSIS — E1142 Type 2 diabetes mellitus with diabetic polyneuropathy: Secondary | ICD-10-CM | POA: Diagnosis not present

## 2018-08-20 DIAGNOSIS — B351 Tinea unguium: Secondary | ICD-10-CM | POA: Diagnosis not present

## 2018-08-20 DIAGNOSIS — R600 Localized edema: Secondary | ICD-10-CM | POA: Diagnosis not present

## 2018-08-20 DIAGNOSIS — M79675 Pain in left toe(s): Secondary | ICD-10-CM | POA: Diagnosis not present

## 2018-08-20 DIAGNOSIS — M79674 Pain in right toe(s): Secondary | ICD-10-CM

## 2018-08-20 NOTE — Patient Instructions (Addendum)

## 2018-08-21 ENCOUNTER — Telehealth: Payer: Self-pay | Admitting: *Deleted

## 2018-08-21 DIAGNOSIS — I824Z1 Acute embolism and thrombosis of unspecified deep veins of right distal lower extremity: Secondary | ICD-10-CM

## 2018-08-21 DIAGNOSIS — M79661 Pain in right lower leg: Secondary | ICD-10-CM

## 2018-08-21 DIAGNOSIS — R600 Localized edema: Secondary | ICD-10-CM

## 2018-08-21 NOTE — Progress Notes (Signed)
Subjective: [name] presents for preventative diabetic foot care on today with history of diabetes, neuropathy and CVA. Patient seen for follow up of chronic, painful mycotic toenails which interfere with daily activities and routine tasks.   She has h/o pressure ulcer posterior right heel (unstageable), and family has initiated daily pressure precautions.  [name] states heel is better.  Brenda Bill, MD is her PCP and she has appointment this Thursday. Last visit was 08/05/2018.  Very involved and supportive [name] present during the visit. [name] states patient's right LE has been more swollen lately. [name] is now on Plavix and aspirin. She states Mom has kept right leg in dependent position over the last few days. She cannot get TED hose on due to edema of RLE.  She has not had this amount of swelling before. She was evaluated for DVT and found to be negative on her last hospitalization early May.   Current Outpatient Medications:  .  ACCU-CHEK AVIVA PLUS test strip, USE ONE STRIP TO TEST BEFORE MEALS AND AND AT BEDTIME, Disp: 100 each, Rfl: 5 .  ACCU-CHEK SOFTCLIX LANCETS lancets, FOR TEST DOSE BEFORE MEALS AND AT BEDTIME, Disp: 100 each, Rfl: 5 .  acetaminophen (TYLENOL) 325 MG tablet, Take 1-2 tablets (325-650 mg total) by mouth every 4 (four) hours as needed for mild pain (available over the counter)., Disp: , Rfl:  .  amLODipine (NORVASC) 10 MG tablet, Take 1 tablet (10 mg total) by mouth daily., Disp: 30 tablet, Rfl: 5 .  atorvastatin (LIPITOR) 20 MG tablet, Take 1 tablet (20 mg total) by mouth daily., Disp: 30 tablet, Rfl: 5 .  baclofen (LIORESAL) 10 MG tablet, TAKE ONE (1) TABLET BY MOUTH TWO (2) TIMES DAILY TO HELP WITH TONE/SPASMS, Disp: 60 each, Rfl: 2 .  benzonatate (TESSALON) 100 MG capsule, Take 1 capsule (100 mg total) by mouth every 8 (eight) hours., Disp: 21 capsule, Rfl: 0 .  cetirizine (ZYRTEC) 10 MG tablet, TAKE ONE (1) TABLET BY MOUTH EVERY DAY,  Disp: 30 tablet, Rfl: 3 .  clopidogrel (PLAVIX) 75 MG tablet, Take 1 tablet (75 mg total) by mouth daily., Disp: 30 tablet, Rfl: 11 .  gabapentin (NEURONTIN) 100 MG capsule, Take 1 capsule (100 mg total) by mouth 3 (three) times daily., Disp: 90 capsule, Rfl: 5 .  glucose blood test strip, USE TO TEST BLOOD SUGARS BEFORE MEALS AND AT BEDTIME, Disp: , Rfl:  .  hydrochlorothiazide (HYDRODIURIL) 25 MG tablet, TAKE ONE (1) TABLET BY MOUTH EVERY DAY (Patient taking differently: Take 25 mg by mouth daily. ), Disp: 30 tablet, Rfl: 5 .  HYDROcodone-acetaminophen (NORCO/VICODIN) 5-325 MG tablet, Take 1 tablet by mouth every 6 (six) hours as needed. for pain, Disp: , Rfl:  .  ibuprofen (ADVIL,MOTRIN) 400 MG tablet, Take 1 tablet (400 mg total) by mouth every 6 (six) hours as needed., Disp: 30 tablet, Rfl: 0 .  insulin aspart (NOVOLOG) 100 UNIT/ML injection, 0-12 units as per sliding scale (Patient taking differently: Inject 0-12 Units into the skin 3 (three) times daily with meals. per sliding scale), Disp: 10 mL, Rfl: 3 .  LANTUS SOLOSTAR 100 UNIT/ML Solostar Pen, Inject 16 Units into the skin daily. , Disp: , Rfl:  .  levETIRAcetam (KEPPRA) 500 MG tablet, Take 1 tablet (500 mg total) by mouth 2 (two) times daily., Disp: 60 tablet, Rfl: 0 .  lubiprostone (AMITIZA) 8 MCG capsule, Take 1 capsule (8 mcg total) by mouth 2 (two) times daily with a meal.,  Disp: 60 capsule, Rfl: 11 .  metoprolol (LOPRESSOR) 100 MG tablet, Take 1.5 tablets (150 mg total) by mouth 2 (two) times daily., Disp: 90 tablet, Rfl: 5 .  nystatin (MYCOSTATIN/NYSTOP) powder, Apply topically 2 (two) times daily., Disp: 45 g, Rfl: 0 .  polyethylene glycol powder (GLYCOLAX/MIRALAX) powder, Take 17 g by mouth daily as needed (constipation)., Disp: 527 g, Rfl: 3 .  Spacer/Aero-Holding Chambers (E-Z SPACER) inhaler, Use as instructed, Disp: 1 each, Rfl: 2 .  UNIFINE PENTIPS 31G X 6 MM MISC, USE AS PRESCRIBED BY PHYSICIAN, Disp: 100 each, Rfl: 11  No  Known Allergies  Objective: Vitals:   08/20/18 1426  Temp: 97.9 F (36.6 C)    Vascular Examination: Capillary refill time <3 seconds x 10 digits.  Dorsalis pedis pulses absent b/l.  Posterior tibial pulses 1/4 b/l.  No digital hair x 10 digits.  Skin temperature WNL b/l.  +3 edema noted RLE. No increased warmth. No pain with calf compression. Limb is edematous from knee to foot. No ischemia or abnormal coolness. No crepitation with pitting.  Dermatological Examination: Skin with normal turgor, texture and tone b/l.  Toenails 1-5 b/l discolored, thick, dystrophic with subungual debris and pain with palpation to nailbeds due to thickness of nails.  Right posterior heel intact.  Musculoskeletal: Muscle strength 5/5 to all LE muscle groups of LLE.  RLE flaccid secondary to CVA.  Neurological: Sensation absent with 10 gram monofilament.  Vibratory sensation absent.  Assessment: 1. Painful onychomycosis toenails 1-5 b/l 2. Unilateral edema RLE 3. NIDDM with Neuropathy  Plan: 1. Toenails 1-5 b/l were debrided in length and girth without iatrogenic bleeding. 2. Order venous dopplers to rule out DVT of RLE. Order sent to [facility]. Educated [name] on signs of CVA and pulmonary embolism. She related understanding.  3. Patient to continue soft, supportive shoe gear daily. 4. Patient to report any pedal injuries to medical professional immediately. 5. Follow up 3 months.  6. Patient/POA to call should there be a concern in the interim.

## 2018-08-22 NOTE — Telephone Encounter (Signed)
[place] - MEDICAID, [place] P. CHANGED THE NPI OF THE VENOUS DOPPLER TO [phone].

## 2018-08-22 NOTE — Telephone Encounter (Signed)
Left message for [facility] asking if I also needed to change the facility to [facility].

## 2018-08-22 NOTE — Telephone Encounter (Signed)
[place] - [facility] states change facility to [facility]. Orders faxed to [facility] - Main Scheduling.

## 2018-08-22 NOTE — Telephone Encounter (Signed)
-----   Message from Baxter Flattery sent at 08/22/2018 10:40 AM EDT ----- Regarding: FW: NEED PRECERT Billing is saying the facility needs to be changed to [facility] with NPI # [id number] or it won't get paid.Marland Kitchen ----- Message ----- From: Baxter Flattery Sent: 08/21/2018  10:59 AM EDT To: Andres Ege, RN Subject: NEED [name], I have this patient scheduled for 6/5/202 @ 4:00 looks like we need percert on her.. Thanks

## 2018-08-23 ENCOUNTER — Ambulatory Visit (HOSPITAL_COMMUNITY)
Admission: RE | Admit: 2018-08-23 | Payer: Medicaid Other | Source: Ambulatory Visit | Attending: Podiatry | Admitting: Podiatry

## 2018-08-27 ENCOUNTER — Encounter (HOSPITAL_COMMUNITY): Payer: Self-pay

## 2018-08-28 ENCOUNTER — Other Ambulatory Visit: Payer: Self-pay

## 2018-08-28 ENCOUNTER — Telehealth: Payer: Self-pay

## 2018-08-28 NOTE — Telephone Encounter (Signed)
Yes it is a concern. Hold Plavix and call primary MD for advice

## 2018-08-28 NOTE — Telephone Encounter (Signed)
Message from pts daughter on mychart:  Hello Dr.Sethi  My mother is urinating a significant amount of blood that looks like a dark tarry color. I looked up the side effects and seen that Plavix can cause this to occur. I am concerned about her bleeding it's been occurring for about 3 days now.

## 2018-08-28 NOTE — Telephone Encounter (Signed)
Left vm for patients daughter [name] to call back and to hold Plavix because of blood in urine and to follow up with PCP for evaluation. Pts daughter was sent mychart message.

## 2018-08-28 NOTE — Telephone Encounter (Signed)
Message sent to Dr.Sethi to review pts daughter concerns.

## 2018-08-29 NOTE — Telephone Encounter (Signed)
PTs daughter receive mychart message and responded See respond below from daughter  to Me      6:36 PM I just listened to the voicemail you left and I will hold the Plavix and contact her PCP as well my mom also has an upcoming appointment with PCP on the 26th of this mon

## 2018-08-31 ENCOUNTER — Other Ambulatory Visit: Payer: Self-pay

## 2018-08-31 ENCOUNTER — Ambulatory Visit
Admission: RE | Admit: 2018-08-31 | Discharge: 2018-08-31 | Disposition: A | Discharge status: Home / Self Care | Payer: Medicaid Other | Source: Ambulatory Visit | Attending: Neurology | Admitting: Neurology

## 2018-08-31 DIAGNOSIS — I6300 Cerebral infarction due to thrombosis of unspecified precerebral artery: Secondary | ICD-10-CM

## 2018-09-02 ENCOUNTER — Other Ambulatory Visit (INDEPENDENT_AMBULATORY_CARE_PROVIDER_SITE_OTHER): Payer: Medicaid Other | Admitting: Neurology

## 2018-09-02 ENCOUNTER — Other Ambulatory Visit: Payer: Self-pay

## 2018-09-02 DIAGNOSIS — R569 Unspecified convulsions: Secondary | ICD-10-CM

## 2018-09-02 DIAGNOSIS — I6300 Cerebral infarction due to thrombosis of unspecified precerebral artery: Secondary | ICD-10-CM

## 2018-09-03 ENCOUNTER — Ambulatory Visit: Payer: Medicaid Other

## 2018-09-14 ENCOUNTER — Other Ambulatory Visit: Payer: Self-pay | Admitting: Neurology

## 2018-09-19 ENCOUNTER — Other Ambulatory Visit: Payer: Self-pay

## 2018-09-19 MED ORDER — LEVETIRACETAM 500 MG PO TABS: 500.0000 mg | ORAL_TABLET | Freq: Two times a day (BID) | ORAL | 3 refills | Status: DC

## 2018-10-17 ENCOUNTER — Other Ambulatory Visit: Payer: Self-pay

## 2018-10-17 ENCOUNTER — Ambulatory Visit
Admission: RE | Admit: 2018-10-17 | Discharge: 2018-10-17 | Disposition: A | Discharge status: Home / Self Care | Payer: Medicaid Other | Source: Ambulatory Visit | Attending: Family Medicine | Admitting: Family Medicine

## 2018-10-17 DIAGNOSIS — Z1231 Encounter for screening mammogram for malignant neoplasm of breast: Secondary | ICD-10-CM

## 2018-11-26 ENCOUNTER — Ambulatory Visit: Payer: Medicaid Other | Admitting: Podiatry

## 2018-12-27 ENCOUNTER — Encounter: Payer: Self-pay | Admitting: Podiatry

## 2018-12-27 ENCOUNTER — Ambulatory Visit: Payer: Medicaid Other | Admitting: Podiatry

## 2018-12-27 ENCOUNTER — Other Ambulatory Visit: Payer: Self-pay

## 2018-12-27 DIAGNOSIS — B351 Tinea unguium: Secondary | ICD-10-CM

## 2018-12-27 DIAGNOSIS — M79674 Pain in right toe(s): Secondary | ICD-10-CM

## 2018-12-27 DIAGNOSIS — M79675 Pain in left toe(s): Secondary | ICD-10-CM | POA: Diagnosis not present

## 2018-12-27 NOTE — Patient Instructions (Signed)
Diabetes Mellitus and Foot Care Foot care is an important part of your health, especially when you have diabetes. Diabetes may cause you to have problems because of poor blood flow (circulation) to your feet and legs, which can cause your skin to:  Become thinner and drier.  Break more easily.  Heal more slowly.  Peel and crack. You may also have nerve damage (neuropathy) in your legs and feet, causing decreased feeling in them. This means that you may not notice minor injuries to your feet that could lead to more serious problems. Noticing and addressing any potential problems early is the best way to prevent future foot problems. How to care for your feet Foot hygiene  Wash your feet daily with warm water and mild soap. Do not use hot water. Then, pat your feet and the areas between your toes until they are completely dry. Do not soak your feet as this can dry your skin.  Trim your toenails straight across. Do not dig under them or around the cuticle. File the edges of your nails with an emery board or nail file.  Apply a moisturizing lotion or petroleum jelly to the skin on your feet and to dry, brittle toenails. Use lotion that does not contain alcohol and is unscented. Do not apply lotion between your toes. Shoes and socks  Wear clean socks or stockings every day. Make sure they are not too tight. Do not wear knee-high stockings since they may decrease blood flow to your legs.  Wear shoes that fit properly and have enough cushioning. Always look in your shoes before you put them on to be sure there are no objects inside.  To break in new shoes, wear them for just a few hours a day. This prevents injuries on your feet. Wounds, scrapes, corns, and calluses  Check your feet daily for blisters, cuts, bruises, sores, and redness. If you cannot see the bottom of your feet, use a mirror or ask someone for help.  Do not cut corns or calluses or try to remove them with medicine.  If you  find a minor scrape, cut, or break in the skin on your feet, keep it and the skin around it clean and dry. You may clean these areas with mild soap and water. Do not clean the area with peroxide, alcohol, or iodine.  If you have a wound, scrape, corn, or callus on your foot, look at it several times a day to make sure it is healing and not infected. Check for: ? Redness, swelling, or pain. ? Fluid or blood. ? Warmth. ? Pus or a bad smell. General instructions  Do not cross your legs. This may decrease blood flow to your feet.  Do not use heating pads or hot water bottles on your feet. They may burn your skin. If you have lost feeling in your feet or legs, you may not know this is happening until it is too late.  Protect your feet from hot and cold by wearing shoes, such as at the beach or on hot pavement.  Schedule a complete foot exam at least once a year (annually) or more often if you have foot problems. If you have foot problems, report any cuts, sores, or bruises to your health care provider immediately. Contact a health care provider if:  You have a medical condition that increases your risk of infection and you have any cuts, sores, or bruises on your feet.  You have an injury that is not   healing.  You have redness on your legs or feet.  You feel burning or tingling in your legs or feet.  You have pain or cramps in your legs and feet.  Your legs or feet are numb.  Your feet always feel cold.  You have pain around a toenail. Get help right away if:  You have a wound, scrape, corn, or callus on your foot and: ? You have pain, swelling, or redness that gets worse. ? You have fluid or blood coming from the wound, scrape, corn, or callus. ? Your wound, scrape, corn, or callus feels warm to the touch. ? You have pus or a bad smell coming from the wound, scrape, corn, or callus. ? You have a fever. ? You have a red line going up your leg. Summary  Check your feet every day  for cuts, sores, red spots, swelling, and blisters.  Moisturize feet and legs daily.  Wear shoes that fit properly and have enough cushioning.  If you have foot problems, report any cuts, sores, or bruises to your health care provider immediately.  Schedule a complete foot exam at least once a year (annually) or more often if you have foot problems. This information is not intended to replace advice given to you by your health care provider. Make sure you discuss any questions you have with your health care provider. Document Released: 03/03/2000 Document Revised: 04/18/2017 Document Reviewed: 04/07/2016 Elsevier Patient Education  2020 Else[employer] Inc.   Onychomycosis/Fungal Toenails  WHAT IS IT? An infection that lies within the keratin of your nail plate that is caused by a fungus.  WHY ME? Fungal infections affect all ages, sexes, races, and creeds.  There may be many factors that predispose you to a fungal infection such as age, coexisting medical conditions such as diabetes, or an autoimmune disease; stress, medications, fatigue, genetics, etc.  Bottom line: fungus thrives in a warm, moist environment and your shoes offer such a location.  IS IT CONTAGIOUS? Theoretically, yes.  You do not want to share shoes, nail clippers or files with someone who has fungal toenails.  Walking around barefoot in the same room or sleeping in the same bed is unlikely to transfer the organism.  It is important to realize, however, that fungus can spread easily from one nail to the next on the same foot.  HOW DO WE TREAT THIS?  There are several ways to treat this condition.  Treatment may depend on many factors such as age, medications, pregnancy, liver and kidney conditions, etc.  It is best to ask your doctor which options are available to you.  1. No treatment.   Unlike many other medical concerns, you can live with this condition.  However for many people this can be a painful condition and may lead to  ingrown toenails or a bacterial infection.  It is recommended that you keep the nails cut short to help reduce the amount of fungal nail. 2. Topical treatment.  These range from herbal remedies to prescription strength nail lacquers.  About 40-50% effective, topicals require twice daily application for approximately 9 to 12 months or until an entirely new nail has grown out.  The most effective topicals are medical grade medications available through physicians offices. 3. Oral antifungal medications.  With an 80-90% cure rate, the most common oral medication requires 3 to 4 months of therapy and stays in your system for a year as the new nail grows out.  Oral antifungal medications do require   blood work to make sure it is a safe drug for you.  A liver function panel will be performed prior to starting the medication and after the first month of treatment.  It is important to have the blood work performed to avoid any harmful side effects.  In general, this medication safe but blood work is required. 4. Laser Therapy.  This treatment is performed by applying a specialized laser to the affected nail plate.  This therapy is noninvasive, fast, and non-painful.  It is not covered by insurance and is therefore, out of pocket.  The results have been very good with a 80-95% cure rate.  The [facility] is the only practice in the area to offer this therapy. 5. Permanent Nail Avulsion.  Removing the entire nail so that a new nail will not grow back. 

## 2018-12-30 NOTE — Progress Notes (Signed)
Subjective: [name] is seen today for diabetic foot care follow up painful, elongated, thickened toenails b/ll feet that she cannot cut. Pain interferes with daily activities. Aggravating factor includes wearing enclosed shoe gear and relieved with periodic debridement.  Daughter is present during the visit. Daughter would like to know what to do for dry, caked on dead skin of her Mom's feet. Mom seldom lets agency caregivers clean her feet.   Current Outpatient Medications on File Prior to Visit  Medication Sig  . sodium fluoride (PREVIDENT) 1.1 % GEL dental gel USE AS DIRECTED.  Marland Kitchen ACCU-CHEK AVIVA PLUS test strip USE ONE STRIP TO TEST BEFORE MEALS AND AND AT BEDTIME  . ACCU-CHEK SOFTCLIX LANCETS lancets FOR TEST DOSE BEFORE MEALS AND AT BEDTIME  . acetaminophen (TYLENOL) 325 MG tablet Take 1-2 tablets (325-650 mg total) by mouth every 4 (four) hours as needed for mild pain (available over the counter).  Marland Kitchen amLODipine (NORVASC) 10 MG tablet Take 1 tablet (10 mg total) by mouth daily.  Marland Kitchen aspirin EC 81 MG tablet Take by mouth.  Marland Kitchen atorvastatin (LIPITOR) 20 MG tablet Take 1 tablet (20 mg total) by mouth daily.  . baclofen (LIORESAL) 10 MG tablet TAKE ONE (1) TABLET BY MOUTH TWO (2) TIMES DAILY TO HELP WITH TONE/SPASMS  . benzonatate (TESSALON) 100 MG capsule Take 1 capsule (100 mg total) by mouth every 8 (eight) hours.  . cetirizine (ZYRTEC) 10 MG tablet TAKE ONE (1) TABLET BY MOUTH EVERY DAY  . clopidogrel (PLAVIX) 75 MG tablet Take 1 tablet (75 mg total) by mouth daily.  Marland Kitchen gabapentin (NEURONTIN) 100 MG capsule Take 1 capsule (100 mg total) by mouth 3 (three) times daily.  Marland Kitchen glucose blood test strip USE TO TEST BLOOD SUGARS BEFORE MEALS AND AT BEDTIME  . hydrochlorothiazide (HYDRODIURIL) 25 MG tablet TAKE ONE (1) TABLET BY MOUTH EVERY DAY (Patient taking differently: Take 25 mg by mouth daily. )  . HYDROcodone-acetaminophen (NORCO/VICODIN) 5-325 MG tablet Take 1 tablet by mouth every 6  (six) hours as needed. for pain  . ibuprofen (ADVIL,MOTRIN) 400 MG tablet Take 1 tablet (400 mg total) by mouth every 6 (six) hours as needed.  . insulin aspart (NOVOLOG) 100 UNIT/ML injection 0-12 units as per sliding scale (Patient taking differently: Inject 0-12 Units into the skin 3 (three) times daily with meals. per sliding scale)  . LANTUS SOLOSTAR 100 UNIT/ML Solostar Pen Inject 16 Units into the skin daily.   Marland Kitchen levETIRAcetam (KEPPRA) 500 MG tablet Take 1 tablet (500 mg total) by mouth 2 (two) times daily.  Marland Kitchen lubiprostone (AMITIZA) 8 MCG capsule Take 1 capsule (8 mcg total) by mouth 2 (two) times daily with a meal.  . metoprolol (LOPRESSOR) 100 MG tablet Take 1.5 tablets (150 mg total) by mouth 2 (two) times daily.  Marland Kitchen nystatin (MYCOSTATIN/NYSTOP) powder Apply topically 2 (two) times daily.  . polyethylene glycol powder (GLYCOLAX/MIRALAX) powder Take 17 g by mouth daily as needed (constipation).  . Spacer/Aero-Holding Chambers (E-Z SPACER) inhaler Use as instructed  . UNIFINE PENTIPS 31G X 6 MM MISC USE AS PRESCRIBED BY PHYSICIAN   No current facility-administered medications on file prior to visit.      No Known Allergies   Objective:  Vascular Examination: Capillary refill time <3 seconds b/l.   Dorsalis pedis absent b/l  Posterior tibial pulses faintly palpable b/l.  Digital hair absent b/l.  Skin temperature gradient WNL b/l.    +3 edema RLE. No pain on calf compression. No  increased warmth. This is her baseline for the flaccid LE secondary to CVA.   Dermatological Examination: Skin with normal turgor, texture and tone b/l.  Toenails 1-5 b/l discolored, thick, dystrophic with subungual debris and pain with palpation to nailbeds due to thickness of nails.  Areas of dried, thick skin plaques noted lateral aspect of right foot and plantarcentral arch area. No erythema, no edema, no drainage, no flocculence.  Heel remains intact RLE. No evidence of ulceration.    Musculoskeletal: Muscle strength 5/5 to all LE muscle groups of the LLE.  Flaccid RLE secondary to CVA.  No gross bony deformities b/l.  No pain, crepitus or joint limitation noted with ROM.   Neurological Examination: Protective sensation absent with 10 gram monofilament bilaterally.  Assessment: Painful onychomycosis toenails 1-5 b/l  Poor pedal hygiene NIDDM with neuropathy  Plan: 1. Toenails 1-5 b/l were debrided in length and girth without iatrogenic bleeding.  2. Continue offloading of right heel in bed due to flaccidity.  3. Advised daughter to wash feet once weekly with Dove soap. Use baby hair brush for flaky skin in water. Flakes should gradually come off.  4. Patient to continue soft, supportive shoe gear. 5. Patient to report any pedal injuries to medical professional immediately. 6. Follow up 3 months.  7. Patient/POA to call should there be a concern in the interim.

## 2019-01-02 ENCOUNTER — Telehealth: Payer: Self-pay | Admitting: Neurology

## 2019-01-02 NOTE — Telephone Encounter (Signed)
LEft vm for Lisa at [facility] [phone] to call back on clearance form for pts surgery.

## 2019-01-02 NOTE — Telephone Encounter (Addendum)
Lattie Haw from [facility] called stating that they are needing medical clearance for the pt to be able to be put to sleep and receive the surgery she is needing. Lattie Haw will be faxing over the notes as well so that it can be gone over. Please advise.

## 2019-01-06 NOTE — Telephone Encounter (Signed)
LEft 2nd vm with triage nurse to call back about clearance for pt.

## 2019-01-07 NOTE — Telephone Encounter (Signed)
Clearance form fax to [facility]ention Dr[facility]Wagner at [phone]. Form fax twice and confirmed. Per Dr. Leonie Man pt has history of seizures and should continue keppra medication as prescribed. She may hold Plavix or aspirin for 3 days prior to procedure with small but acceptable periprocedural risk of TIA or stroke if pt is willing. Patient can resume plavix or aspirin when safe after surgery is over. Pt needs to stay on keppra medication prior to surgery and continuing taking it after its over.

## 2019-01-07 NOTE — Telephone Encounter (Signed)
Clearance form given to Dr.Sethi to review.

## 2019-01-16 ENCOUNTER — Other Ambulatory Visit: Payer: Self-pay | Admitting: Neurology

## 2019-03-19 DIAGNOSIS — R87619 Unspecified abnormal cytological findings in specimens from cervix uteri: Secondary | ICD-10-CM | POA: Insufficient documentation

## 2019-04-02 ENCOUNTER — Other Ambulatory Visit: Payer: Self-pay

## 2019-04-02 ENCOUNTER — Encounter: Payer: Self-pay | Admitting: Podiatry

## 2019-04-02 ENCOUNTER — Ambulatory Visit: Payer: Medicaid Other | Admitting: Podiatry

## 2019-04-02 DIAGNOSIS — M79674 Pain in right toe(s): Secondary | ICD-10-CM | POA: Diagnosis not present

## 2019-04-02 DIAGNOSIS — E1142 Type 2 diabetes mellitus with diabetic polyneuropathy: Secondary | ICD-10-CM

## 2019-04-02 DIAGNOSIS — M79675 Pain in left toe(s): Secondary | ICD-10-CM

## 2019-04-02 DIAGNOSIS — B351 Tinea unguium: Secondary | ICD-10-CM | POA: Diagnosis not present

## 2019-04-02 NOTE — Patient Instructions (Signed)
Diabetes Mellitus and Foot Care Foot care is an important part of your health, especially when you have diabetes. Diabetes may cause you to have problems because of poor blood flow (circulation) to your feet and legs, which can cause your skin to:  Become thinner and drier.  Break more easily.  Heal more slowly.  Peel and crack. You may also have nerve damage (neuropathy) in your legs and feet, causing decreased feeling in them. This means that you may not notice minor injuries to your feet that could lead to more serious problems. Noticing and addressing any potential problems early is the best way to prevent future foot problems. How to care for your feet Foot hygiene  Wash your feet daily with warm water and mild soap. Do not use hot water. Then, pat your feet and the areas between your toes until they are completely dry. Do not soak your feet as this can dry your skin.  Trim your toenails straight across. Do not dig under them or around the cuticle. File the edges of your nails with an emery board or nail file.  Apply a moisturizing lotion or petroleum jelly to the skin on your feet and to dry, brittle toenails. Use lotion that does not contain alcohol and is unscented. Do not apply lotion between your toes. Shoes and socks  Wear clean socks or stockings every day. Make sure they are not too tight. Do not wear knee-high stockings since they may decrease blood flow to your legs.  Wear shoes that fit properly and have enough cushioning. Always look in your shoes before you put them on to be sure there are no objects inside.  To break in new shoes, wear them for just a few hours a day. This prevents injuries on your feet. Wounds, scrapes, corns, and calluses  Check your feet daily for blisters, cuts, bruises, sores, and redness. If you cannot see the bottom of your feet, use a mirror or ask someone for help.  Do not cut corns or calluses or try to remove them with medicine.  If you  find a minor scrape, cut, or break in the skin on your feet, keep it and the skin around it clean and dry. You may clean these areas with mild soap and water. Do not clean the area with peroxide, alcohol, or iodine.  If you have a wound, scrape, corn, or callus on your foot, look at it several times a day to make sure it is healing and not infected. Check for: ? Redness, swelling, or pain. ? Fluid or blood. ? Warmth. ? Pus or a bad smell. General instructions  Do not cross your legs. This may decrease blood flow to your feet.  Do not use heating pads or hot water bottles on your feet. They may burn your skin. If you have lost feeling in your feet or legs, you may not know this is happening until it is too late.  Protect your feet from hot and cold by wearing shoes, such as at the beach or on hot pavement.  Schedule a complete foot exam at least once a year (annually) or more often if you have foot problems. If you have foot problems, report any cuts, sores, or bruises to your health care provider immediately. Contact a health care provider if:  You have a medical condition that increases your risk of infection and you have any cuts, sores, or bruises on your feet.  You have an injury that is not   healing.  You have redness on your legs or feet.  You feel burning or tingling in your legs or feet.  You have pain or cramps in your legs and feet.  Your legs or feet are numb.  Your feet always feel cold.  You have pain around a toenail. Get help right away if:  You have a wound, scrape, corn, or callus on your foot and: ? You have pain, swelling, or redness that gets worse. ? You have fluid or blood coming from the wound, scrape, corn, or callus. ? Your wound, scrape, corn, or callus feels warm to the touch. ? You have pus or a bad smell coming from the wound, scrape, corn, or callus. ? You have a fever. ? You have a red line going up your leg. Summary  Check your feet every day  for cuts, sores, red spots, swelling, and blisters.  Moisturize feet and legs daily.  Wear shoes that fit properly and have enough cushioning.  If you have foot problems, report any cuts, sores, or bruises to your health care provider immediately.  Schedule a complete foot exam at least once a year (annually) or more often if you have foot problems. This information is not intended to replace advice given to you by your health care provider. Make sure you discuss any questions you have with your health care provider. Document Revised: 11/27/2018 Document Reviewed: 04/07/2016 Elsevier Patient Education  2020 [employer]  

## 2019-04-09 NOTE — Progress Notes (Signed)
Subjective: [name] presents today for preventative diabetic foot care. Patient is seen for follow up of painful, mycotic toenails which interfere with comfortable ambulation when wearing enclosed shoe gear. Pain is relieved with periodic professional debridement.  Bartholome Bill, MD is patient's PCP. Last visit was: December 17, 2018.  Medications reviewed in chart.  No Known Allergies  Objective: There were no vitals filed for this visit.  Vascular Examination: Capillary refill time to digits less than 3 seconds bilaterally.  Dorsalis pedis nonpalpable bilaterally.  Posterior tibial pulses faintly palpable bilaterally.  Digital hair  present x 10 digits.  Skin temperature gradient WNL b/l.   +2 edema noted right lower extremity.  Is no increased warmth, no pain on calf compression.  Dermatological Examination: Skin with normal turgor, texture and tone b/l.  Toenails 1-5 b/l discolored, thick, dystrophic with subungual debris and pain with palpation to nailbeds due to thickness of nails.  Much noted and improvement of the her pedal hygiene.  Musculoskeletal: Muscle strength 5/5 b/l to all LE muscle groups of the left lower extremity.  She has a flaccid right lower extremity secondary to CVA.  Gross bony deformities: None bilaterally.  No pain, crepitus or joint limitation with passive/active ROM b/l.  Neurological Examination: Protective sensation absent b/l with 10 gram monofilament.  Vibratory sensation absent bilaterally. Assessment: 1. Painful onychomycosis toenails 1-5 b/l 2. NIDDM with neuropathy  Plan: 1. Noted significant improvement in pedal hygiene on today's visit.  2. Continue diabetic foot care principles. Literature dispensed on today. 3. Toenails 1-5 b/l were debrided in length and girth without iatrogenic bleeding. 4. Patient to continue soft, supportive shoe gear. 5. Patient to report any pedal injuries to medical  professional. 6. Follow up 3 months.  7. Patient/POA to call should there be a concern in the interim.

## 2019-05-23 ENCOUNTER — Ambulatory Visit (HOSPITAL_COMMUNITY)
Admission: EM | Admit: 2019-05-23 | Discharge: 2019-05-23 | Disposition: A | Discharge status: Home / Self Care | Payer: Medicaid Other | Attending: Family Medicine | Admitting: Family Medicine

## 2019-05-23 ENCOUNTER — Encounter (HOSPITAL_COMMUNITY): Payer: Self-pay

## 2019-05-23 ENCOUNTER — Other Ambulatory Visit: Payer: Self-pay

## 2019-05-23 DIAGNOSIS — R300 Dysuria: Secondary | ICD-10-CM | POA: Diagnosis present

## 2019-05-23 DIAGNOSIS — N3090 Cystitis, unspecified without hematuria: Secondary | ICD-10-CM | POA: Insufficient documentation

## 2019-05-23 DIAGNOSIS — R319 Hematuria, unspecified: Secondary | ICD-10-CM | POA: Diagnosis present

## 2019-05-23 LAB — POCT URINALYSIS DIP (DEVICE)
Bilirubin Urine: NEGATIVE
Glucose, UA: NEGATIVE mg/dL
Ketones, ur: NEGATIVE mg/dL
Nitrite: NEGATIVE
Protein, ur: 30 mg/dL — AB
Specific Gravity, Urine: 1.02 (ref 1.005–1.030)
Urobilinogen, UA: 0.2 mg/dL (ref 0.0–1.0)
pH: 6 (ref 5.0–8.0)

## 2019-05-23 MED ORDER — NITROFURANTOIN MONOHYD MACRO 100 MG PO CAPS: 100.0000 mg | ORAL_CAPSULE | Freq: Two times a day (BID) | ORAL | 0 refills | Status: DC

## 2019-05-23 NOTE — Discharge Instructions (Signed)
You may have a urinary tract infection. We are going to culture your urine and will call you as soon as we have the results.   Drink plenty of water, 8-10 glasses per day.   You may take AZO over the counter for painful urination.  Follow up with your primary care provider as needed.   Go to the Emergency Department if you experience severe pain, shortness of breath, high fever, or other concerns.   

## 2019-05-23 NOTE — ED Provider Notes (Signed)
[place]    CSN: [id number] Arrival date & time: 05/23/19  [place]      History   Chief Complaint Chief Complaint  Patient presents with  . Urinary Tract Infection    HPI [name] is a 59 y.o. female.   Patient presents with her daughter today.  Patient has history significant for stroke with right hemiparesis, aphasia from stroke.  Daughter reports that mother's urine has been discolored for the last 2 to 3 days.  Daughter reports that the patient has been holding her abdomen for the last 2 to 3 days as well.  Reports pain with urination today.  Denies headache, nausea, vomiting, diarrhea, fever, chills, body aches, rash, other symptoms.  ROS per HPI  The history is provided by the patient and a relative.    Past Medical History:  Diagnosis Date  . Aphasia due to stroke   . Bipolar affective disorder ([place])   . Depression   . Diabetes mellitus ([place])    ?type (05/29/2013)  . Episodic confusion   . 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 Hypertension   . Schizophrenia ([place])    [name] 11/28/2007 (05/29/2013)  . Seizures ([place])   . Stroke [facility]) 05/27/2013    Patient Active Problem List   Diagnosis Date Noted  . Preop examination 07/19/2018  . Postmenopausal bleeding 03/07/2018  . Mixed hyperlipidemia 10/03/2017  . Chronic edema 09/25/2016  . Lymphedema of right lower extremity 08/09/2016  . Hemiparesis, aphasia, and dysphagia as late effect of cerebrovascular accident (CVA) ([place]) 06/23/2016  . Seizures ([place]) 09/29/2015  . Urine leukocytes 01/08/2015  . Expressive aphasia 01/08/2015  . Malnutrition of moderate degree ([place]) 11/11/2014  . Syncope 11/08/2014  . Dehydration 11/08/2014  . Staring spell 07/09/2014  . Adhesive capsulitis of right shoulder 06/04/2014  . Palpitations 04/02/2014  . Essential hypertension 01/05/2014  . DM (diabetes mellitus) ([place]) 01/05/2014  . Paranoid schizophrenia ([place]) 12/28/2013  .  Cerebral embolism with cerebral infarction ([place]) 12/05/2013  . Sinus tachycardia 12/05/2013  . 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 Spastic hemiplegia affecting dominant side ([place]) 09/12/2013  . CVA (cerebral infarction) 06/04/2013  . Dysphagia 06/02/2013  . Benign polycythemia 06/02/2013  . Malignant hypertension 05/28/2013  . DM (diabetes mellitus) type II controlled, neurological manifestation ([place]) 05/27/2013  . Acute embolic stroke ([place]) [id number]  . Aphasia 05/27/2013  . 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 Hypertensive emergency 05/27/2013  . Stroke ([place]) 05/27/2013  . Aphasia complicating stroke [id number]    Past Surgical History:  Procedure Laterality Date  . BREAST BIOPSY Right   . CESAREAN SECTION  ~ 21  . TEE WITHOUT CARDIOVERSION N/A 12/08/2013   Procedure: TRANSESOPHAGEAL ECHOCARDIOGRAM (TEE);  Surgeon: Candee Furbish, MD;  Location: [facility] ENDOSCOPY;  Service: Cardiovascular;  Laterality: N/A;  . TUBAL LIGATION      OB History   No obstetric history on file.      Home Medications    Prior to Admission medications   Medication Sig Start Date End Date Taking? Authorizing Provider  ACCU-CHEK AVIVA PLUS test strip USE ONE STRIP TO TEST BEFORE MEALS AND AND AT BEDTIME 03/21/16   Jegede, Marlena Clipper, MD  ACCU-CHEK SOFTCLIX LANCETS lancets FOR TEST DOSE BEFORE MEALS AND AT BEDTIME 04/24/16   Charlott Rakes, MD  acetaminophen (TYLENOL) 325 MG tablet Take 1-2 tablets (325-650 mg total) by mouth every  4 (four) hours as needed for mild pain (available over the counter). 07/08/13   Love, Ivan Anchors, PA-C  amLODipine (NORVASC) 10 MG tablet Take 1 tablet (10 mg total) by mouth daily. 01/20/16   Charlott Rakes, MD  aspirin EC 81 MG tablet Take by mouth.    [provider]  atorvastatin (LIPITOR) 20 MG tablet Take 1 tablet (20 mg total) by mouth daily. 01/20/16   Charlott Rakes, MD  baclofen (LIORESAL) 10 MG tablet TAKE ONE (1) TABLET BY MOUTH TWO (2) TIMES DAILY TO HELP WITH TONE/SPASMS 04/25/16   Charlott Rakes, MD  benzonatate (TESSALON) 100 MG capsule Take 1 capsule (100 mg total) by mouth every 8 (eight) hours. 11/11/16   Khatri, Hina, PA-C  cetirizine (ZYRTEC) 10 MG tablet TAKE ONE (1) TABLET BY MOUTH EVERY DAY 06/23/16   Charlott Rakes, MD  clopidogrel (PLAVIX) 75 MG tablet Take 1 tablet (75 mg total) by mouth daily. 08/05/18 08/05/19  Garvin Fila, MD  gabapentin (NEURONTIN) 100 MG capsule Take 1 capsule (100 mg total) by mouth 3 (three) times daily. 01/20/16   Charlott Rakes, MD  glucose blood test strip USE TO TEST BLOOD SUGARS BEFORE MEALS AND AT BEDTIME 02/15/18   [provider]  hydrochlorothiazide (HYDRODIURIL) 25 MG tablet TAKE ONE (1) TABLET BY MOUTH EVERY DAY Patient taking differently: Take 25 mg by mouth daily.  01/20/16   Charlott Rakes, MD  HYDROcodone-acetaminophen (NORCO/VICODIN) 5-325 MG tablet Take 1 tablet by mouth every 6 (six) hours as needed. for pain 07/19/18   [provider]  ibuprofen (ADVIL,MOTRIN) 400 MG tablet Take 1 tablet (400 mg total) by mouth every 6 (six) hours as needed. 11/11/16   Khatri, Hina, PA-C  insulin aspart (NOVOLOG) 100 UNIT/ML injection 0-12 units as per sliding scale Patient taking differently: Inject 0-12 Units into the skin 3 (three) times daily with meals. per sliding scale 04/25/16   Charlott Rakes, MD  LANTUS SOLOSTAR 100 UNIT/ML Solostar Pen Inject 16 Units into the skin daily.  05/14/18   [provider]  levETIRAcetam (KEPPRA) 500 MG tablet TAKE 1 TABLET BY MOUTH TWICE A DAY 01/16/19   Garvin Fila, MD  lubiprostone (AMITIZA) 8 MCG capsule Take 1 capsule (8 mcg total) by mouth 2 (two) times daily with a meal. 10/03/16   Nandigam, Venia Minks, MD  metoprolol (LOPRESSOR) 100 MG tablet Take 1.5 tablets (150 mg total) by mouth 2 (two) times daily. 01/20/16   Charlott Rakes, MD   nitrofurantoin, macrocrystal-monohydrate, (MACROBID) 100 MG capsule Take 1 capsule (100 mg total) by mouth 2 (two) times daily. 05/23/19   Faustino Congress, NP  nystatin (MYCOSTATIN/NYSTOP) powder Apply topically 2 (two) times daily. 06/24/16   Wynona Luna, MD  polyethylene glycol powder [facility]) powder Take 17 g by mouth daily as needed (constipation). 02/22/15   Lance Bosch, NP  senna (SENOKOT) 8.6 MG tablet Take one or two tablets by mouth daily as needed for constipation 06/23/16   [provider]  sodium fluoride (PREVIDENT) 1.1 % GEL dental gel USE AS DIRECTED. 11/15/18   [provider]  Spacer/Aero-Holding Chambers (E-Z SPACER) inhaler Use as instructed 06/18/15   Lance Bosch, NP  UNIFINE PENTIPS 31G X 6 MM MISC USE AS PRESCRIBED BY PHYSICIAN 03/16/15   Lance Bosch, NP    Family History Family History  Problem Relation Age of Onset  . Hypertension Mother   . Stroke Father   . Hypertension Father   . Diabetes  Father     Social History Social History   Tobacco Use  . Smoking status: Never Smoker  . Smokeless tobacco: Never Used  Substance Use Topics  . Alcohol use: No    Alcohol/week: 0.0 standard drinks  . Drug use: No     Allergies   Patient has no known allergies.   Review of Systems Review of Systems   Physical Exam Triage Vital Signs ED Triage Vitals [05/23/19 1904]  Enc Vitals Group     BP (!) 158/100     Pulse Rate 99     Resp 16     Temp 98.2 F (36.8 C)     Temp Source Oral     SpO2 98 %     Weight      Height      Head Circumference      Peak Flow      Pain Score      Pain Loc      Pain Edu?      Excl. in [place]?    No data found.  Updated Vital Signs BP (!) 158/100 (BP Location: Left Arm)   Pulse 99   Temp 98.2 F (36.8 C) (Oral)   Resp 16   SpO2 98%      Physical Exam Vitals and nursing note reviewed.  Constitutional:      General: She is not in acute distress.    Appearance: She is  well-developed.  HENT:     Head: Normocephalic and atraumatic.  Eyes:     Conjunctiva/sclera: Conjunctivae normal.  Cardiovascular:     Rate and Rhythm: Normal rate and regular rhythm.     Heart sounds: No murmur.  Pulmonary:     Effort: Pulmonary effort is normal. No respiratory distress.     Breath sounds: Normal breath sounds.  Abdominal:     Palpations: Abdomen is soft.     Tenderness: There is no abdominal tenderness.     Comments: Suprapubic tenderness to palpation  Musculoskeletal:     Cervical back: Neck supple.  Skin:    General: Skin is warm and dry.  Neurological:     Mental Status: She is alert and oriented to person, place, and time. Mental status is at baseline.     Comments: In wheelchair with right hemiparesis  Psychiatric:        Mood and Affect: Mood normal.        Behavior: Behavior normal.      UC Treatments / Results  Labs (all labs ordered are listed, but only abnormal results are displayed) Labs Reviewed  POCT URINALYSIS DIP (DEVICE) - Abnormal; Notable for the following components:      Result Value   Hgb urine dipstick LARGE (*)    Protein, ur 30 (*)    Leukocytes,Ua LARGE (*)    All other components within normal limits  URINE CULTURE    EKG   Radiology No results found.  Procedures Procedures (including critical care time)  Medications Ordered in UC Medications - No data to display  Initial Impression / Assessment and Plan / UC Course  I have reviewed the triage vital signs and the nursing notes.  Pertinent labs & imaging results that were available during my care of the patient were reviewed by me and considered in my medical decision making (see chart for details).     Likely has urinary tract infection.  UA positive for large blood large leuks.  Will culture and inform patient of  results.  We will go ahead and treat today with Macrobid 100 mg twice daily x5 days.  May also use Azo for analgesia.  Instructed to follow-up with  primary care if symptoms are not improving by Monday.  Instructed to follow-up at the ER for high fever, worsening symptoms, or other concerning symptoms. Final Clinical Impressions(s) / UC Diagnoses   Final diagnoses:  Dysuria  Hematuria, unspecified type  Cystitis     Discharge Instructions     You may have a urinary tract infection. We are going to culture your urine and will call you as soon as we have the results.   Drink plenty of water, 8-10 glasses per day.   You may take AZO over the counter for painful urination.  Follow up with your primary care provider as needed.   Go to the Emergency Department if you experience severe pain, shortness of breath, high fever, or other concerns.      ED Prescriptions    Medication Sig Dispense Auth. Provider   nitrofurantoin, macrocrystal-monohydrate, (MACROBID) 100 MG capsule Take 1 capsule (100 mg total) by mouth 2 (two) times daily. 10 capsule Faustino Congress, NP     I have reviewed the PDMP during this encounter.   Faustino Congress, NP 05/23/19 1939

## 2019-05-23 NOTE — ED Triage Notes (Signed)
Pt presents with right lower abdominal pain and blood in urine for past few days; pt had D&C done in December.

## 2019-05-24 LAB — URINE CULTURE: Culture: >=100000 — AB

## 2019-06-23 NOTE — Telephone Encounter (Signed)
Agree with adding depakote ER 500 mg daily and making office f/u visit to discuss tapering and stopping keppra

## 2019-07-07 ENCOUNTER — Ambulatory Visit: Payer: Medicaid Other | Admitting: Neurology

## 2019-07-07 ENCOUNTER — Other Ambulatory Visit: Payer: Self-pay

## 2019-07-07 ENCOUNTER — Encounter: Payer: Self-pay | Admitting: Neurology

## 2019-07-07 VITALS — BP 138/85 | HR 71 | Temp 98.7°F

## 2019-07-07 DIAGNOSIS — I69320 Aphasia following cerebral infarction: Secondary | ICD-10-CM | POA: Diagnosis not present

## 2019-07-07 DIAGNOSIS — G8110 Spastic hemiplegia affecting unspecified side: Secondary | ICD-10-CM | POA: Diagnosis not present

## 2019-07-07 NOTE — Progress Notes (Signed)
[facility] [address] [place]. [place] [address] [phone]       OFFICE FOLLOW UP VISIT NOTE  [name] Date of Birth:  09-08-1960 Medical Record Number:  [id number]   Referring MD: Precious Haws Reason for Referral: Stroke follow-up  HPI: Initial consult 2/26//2020:[name] is a 59 year old African-American lady seen today for consultation.  She is accompanied by her daughter.  History is provided by her, review of electronic medical records and have personally reviewed imaging films in PACS.  She is a 59 year old African-American lady with past medical history of hypertension, diabetes, bipolar disorder who was initially admitted on 05/27/2013 with aphasia and right facial droop.  She was found to have a large left middle cerebral artery infarct on Brenda MRI scan with left middle cerebral artery occlusion in Brenda M1 segment without any collateralization.  Infarcts were felt to be embolic due to unidentified source.  She was placed on aspirin for secondary stroke prevention but had progressive neurological worsening on 05/29/2013.  Symptoms were felt to be due to relative hypotension.  She did not improve despite permissive hypertension.  Extensive work-up including hypercoagulable panel and echocardiogram were negative.  EEG was negative for seizures.  She subsequently had a feeding tube and was transferred to rehab.  She was followed by Dr. Charleen Kirks and got some Botox injections which she did not tolerate well.  She has been living at home with her daughter who provides care.  She does have a CNA during Brenda daytime when Brenda daughter is out.  [name] was followed in office and was last seen by Dr. Erlinda Hong on 07/17/2016.  She apparently has not had any definite stroke or TIA symptoms since her original stroke.  She is had some intermittent transient episodes of confusion when she gets upset.  Brenda daughter has not noticed any new focal neurological findings.  [name]  has been having some intermittent vaginal bleeding.  Her gynecologist plans to do exam under general anesthesia and Compton is referred to me for neurological clearance.  Review of electronic records show that she has not had any brain imaging study or follow-up carotid Dopplers were brain vascular studies over Brenda last several years.  I do not also find any recent lab work for cholesterol or diabetes control. Virtual video visit 08/05/2018:[name] is seen today following recent visit to Brenda emergency room 10 days ago for possible stroke versus seizure.  I have personally reviewed ER visit and imaging films in PACS.  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  She started to recover fairly quickly but initially refused to go to Brenda hospital in an ambulance.  Brenda family subsequently drove her to Brenda hospital and she Refused refusing to get out from a wheelchair.  She complained of mild headache and increased weakness on Brenda right side from her baseline.  Code stroke was activated.  She was seen by Dr. Rory Percy neuro hospitalist who felt she may have had a seizure and appeared to be postictal with transient worsening of her deficits.  CT scan of Brenda head was obtained which showed chronic large left MCA infarct without any bleed or acute changes.  CT angiogram showed severe left M1 stenosis with attenuated  flow pass Brenda left M1 and 60% stenosis of carotid siphons bilaterally.  This is unchanged from prior studies from 05/20/2013.  Her NIH stroke scale was found to be 11 and modified Rankin at baseline 4. Compton was loaded with Keppra and started on Keppra 500 twice daily for seizures.  EEG was not obtained but a previous  EEG from 07/14/2014 had showed focal left hemispheric slowing with left frontotemporal irritability.  Compton has still not recovered back to her prior baseline as per Brenda daughter.  She is still weaker on Brenda right side.  She is still slow to respond.  She is able to communicate her needs but does not speak much.  She still requires 24-hour care at home Update 07/07/2019 : Compton is seen today for follow-up after last visit almost a year ago.  She is accompanied by her daughter.  Gotten noticed that [name] started having some behavioral issues ever since Keppra was started more than a year ago.  She has been more irritable.  She is often pushing people.  She has some paranoid ideation and occasional delusions as well as hallucinations.  Compton's daughter had called [place] and I recommended she decrease Brenda Keppra to 250 mg a day with these behaviors have not completely gone away.  Compton was started on Keppra by me for an episode of altered awareness followed by some confusion for a few hours it was felt to be seizure her EEG done at that time had shown some slowing in Brenda left hemisphere but no definite epileptiform activity.  I had suggested [name] daughter switch Keppra to Depakote but she does not want to try new medication at this time.  Compton continues to have significant aphasia with her understanding seems a lot better.  She still requires constant supervision and lives with her daughter.  She does have a CNA during Brenda day to help and daughter is working.  She is able to walk with a hemiwalker she has had no falls.  She still has significant spasticity at times she even gets clonus in Brenda right leg.  Compton has an upcoming visit in June with primary care physician and will have follow-up lipid profile and hemoglobin A1c checked.  She had D&C done by her gynecologist in December 2020.  There have been no recurrent stroke or TIA symptoms.  There have been no seizure-like episodes either.  She  plans on having a root canal and needs neurological clearance for Brenda procedure.   ROS:   14 system review of systems is positive for delusions, paranoia, irritability, agitation, behavioral change and swelling, confusion, speech difficulty, gait difficulty and all other systems negative  PMH:  Past Medical History:  Diagnosis Date  . Aphasia due to stroke   . Bipolar affective disorder ([place])   . Depression   . Diabetes mellitus ([place])    ?type (05/29/2013)  . Episodic confusion   . 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 Hypertension   . Schizophrenia ([place])    [name] 11/28/2007 (05/29/2013)  . Seizures ([place])   . Stroke [facility]) 05/27/2013    Social History:  Social History   Socioeconomic History  . Marital status: Single    Spouse name: Not on file  . Number of children: 3  . Years of education: 45  . Highest education level: Not on file  Occupational History  . Occupation: disabled  Tobacco Use  . Smoking status: Never Smoker  .  Smokeless tobacco: Never Used  Substance and Sexual Activity  . Alcohol use: No    Alcohol/week: 0.0 standard drinks  . Drug use: No  . Sexual activity: Not on file  Other Topics Concern  . Not on file  Social History Narrative   Compton is single with 3 children.   Compton is right handed.   Compton has hs education.   Compton does not drink caffeine.   Social Determinants of Health   Financial Resource Strain:   . Difficulty of Paying Living Expenses:   Food Insecurity:   . Worried About Charity fundraiser in Brenda Last Year:   . Arboriculturist in Brenda Last Year:   Transportation Needs:   . Film/video editor (Medical):   Marland Kitchen Lack of Transportation (Non-Medical):   Physical Activity:   . Days of Exercise per Week:   . Minutes of Exercise per Session:   Stress:   . Feeling of Stress :   Social Connections:   . Frequency of Communication with Friends and Family:   . Frequency of Social Gatherings  with Friends and Family:   . Attends Religious Services:   . Active Member of Clubs or Organizations:   . Attends Archivist Meetings:   Marland Kitchen Marital Status:   Intimate Partner Violence:   . Fear of Current or Ex-Partner:   . Emotionally Abused:   Marland Kitchen Physically Abused:   . Sexually Abused:     Medications:   Current Outpatient Medications on File Prior to Visit  Medication Sig Dispense Refill  . ACCU-CHEK AVIVA PLUS test strip USE ONE STRIP TO TEST BEFORE MEALS AND AND AT BEDTIME 100 each 5  . ACCU-CHEK SOFTCLIX LANCETS lancets FOR TEST DOSE BEFORE MEALS AND AT BEDTIME 100 each 5  . acetaminophen (TYLENOL) 325 MG tablet Take 1-2 tablets (325-650 mg total) by mouth every 4 (four) hours as needed for mild pain (available over Brenda counter).    Marland Kitchen amLODipine (NORVASC) 10 MG tablet Take 1 tablet (10 mg total) by mouth daily. 30 tablet 5  . aspirin EC 81 MG tablet Take by mouth.    Marland Kitchen atorvastatin (LIPITOR) 20 MG tablet Take 1 tablet (20 mg total) by mouth daily. 30 tablet 5  . baclofen (LIORESAL) 10 MG tablet TAKE ONE (1) TABLET BY MOUTH TWO (2) TIMES DAILY TO HELP WITH TONE/SPASMS 60 each 2  . benzonatate (TESSALON) 100 MG capsule Take 1 capsule (100 mg total) by mouth every 8 (eight) hours. 21 capsule 0  . cetirizine (ZYRTEC) 10 MG tablet TAKE ONE (1) TABLET BY MOUTH EVERY DAY 30 tablet 3  . gabapentin (NEURONTIN) 100 MG capsule Take 1 capsule (100 mg total) by mouth 3 (three) times daily. 90 capsule 5  . glucose blood test strip USE TO TEST BLOOD SUGARS BEFORE MEALS AND AT BEDTIME    . hydrochlorothiazide (HYDRODIURIL) 25 MG tablet TAKE ONE (1) TABLET BY MOUTH EVERY DAY (Compton taking differently: Take 25 mg by mouth daily. ) 30 tablet 5  . ibuprofen (ADVIL,MOTRIN) 400 MG tablet Take 1 tablet (400 mg total) by mouth every 6 (six) hours as needed. 30 tablet 0  . insulin aspart (NOVOLOG) 100 UNIT/ML injection 0-12 units as per sliding scale (Compton taking differently: Inject 0-12 Units  into Brenda skin 3 (three) times daily with meals. per sliding scale) 10 mL 3  . LANTUS SOLOSTAR 100 UNIT/ML Solostar Pen Inject 24 Units into Brenda skin daily.     Marland Kitchen  levETIRAcetam (KEPPRA) 500 MG tablet TAKE 1 TABLET BY MOUTH TWICE A DAY (Compton taking differently: 250 mg 2 (two) times daily. ) 60 tablet 3  . lubiprostone (AMITIZA) 8 MCG capsule Take 1 capsule (8 mcg total) by mouth 2 (two) times daily with a meal. 60 capsule 11  . metoprolol (LOPRESSOR) 100 MG tablet Take 1.5 tablets (150 mg total) by mouth 2 (two) times daily. 90 tablet 5  . nitrofurantoin, macrocrystal-monohydrate, (MACROBID) 100 MG capsule Take 1 capsule (100 mg total) by mouth 2 (two) times daily. 10 capsule 0  . nystatin (MYCOSTATIN/NYSTOP) powder Apply topically 2 (two) times daily. 45 g 0  . polyethylene glycol powder (GLYCOLAX/MIRALAX) powder Take 17 g by mouth daily as needed (constipation). 527 g 3  . senna (SENOKOT) 8.6 MG tablet Take one or two tablets by mouth daily as needed for constipation    . sodium fluoride (PREVIDENT) 1.1 % GEL dental gel USE AS DIRECTED.    Marland Kitchen Spacer/Aero-Holding Chambers (E-Z SPACER) inhaler Use as instructed 1 each 2  . UNIFINE PENTIPS 31G X 6 MM MISC USE AS PRESCRIBED BY PHYSICIAN 100 each 11   No current facility-administered medications on file prior to visit.    Allergies:  No Known Allergies  Physical Exam General: well developed, well nourished middle-aged African-American lady, seated, in no evident distress Head: head normocephalic and atraumatic.   Neck: supple with no carotid or supraclavicular bruits Cardiovascular: regular rate and rhythm, no murmurs Musculoskeletal: no deformity.  She has right foot drop and AFO Skin:  no rash/petichiae Vascular:  Normal pulses all extremities  Neurologic Exam Mental Status: Awake and fully alert.  Severe expressive greater than receptive aphasia and speaks only occasional 1 or 2 words but difficult to comprehend.  She can understand  simple midline commands and one-step commands only.  Unable to name and repeat.   Cranial Nerves: Fundoscopic exam not done Pupils equal, briskly reactive to light. Extraocular movements full without nystagmus. Visual fields showed diminished blink to threat on Brenda right . Hearing intact. Facial sensation intact.  Right lower facial weakness., tongue, palate moves normally and symmetrically.  Motor: Spastic right hemiplegia with right upper extremity proximal strength 2/5 and distal 0/5 with fixed flexion contracture of Brenda wrist and fingers.  Right lower extremity is 4/5 proximally and 3/5 distally with right ankle foot drop and wears an AFO.  Tone is increased on Brenda right side with spasticity. Sensory.: intact to touch , pinprick , position and vibratory sensation.  Coordination: Rapid alternating movements normal in all extremities. Finger-to-nose and heel-to-shin performed accurately bilaterally. Gait and Station: Deferred as Compton did not bring her hemiwalker.   Reflexes: 2+ and ssymmetric and brisker on Brenda right. toes downgoing.   NIHSS  13 Modified Rankin  4   ASSESSMENT: 52 lady with left MCA infarct in March 2015 with significant residual aphasia and right hemiplegia vascular risk factors of hypertension, diabetes and cerebrovascular disease.  Compton has done reasonably well without recurrent neurovascular symptoms since 2015.  New behavioral changes since starting Keppra possibly medication effect.  Less     PLAN: I had a long discussion [name] and her daughter regarding her behavioral changes after starting Keppra and agree with tapering and discontinuing Keppra over Brenda next 2 weeks.  I advised her to take Keppra XR 250 every other day for 2 weeks and then stop.  If she has breakthrough seizure may continue starting alternative agents like Depakote ER in Brenda future.  Check  EEG to look for any brain irritability as well as screening follow-up carotid ultrasound study.  Continue  aspirin for stroke prevention and aggressive risk factor modification with strict control of hypertension with blood pressure goal below 130/90, lipids with LDL cholesterol goal below 70 mg percent and diabetes with hemoglobin A1c goal below 6.5%.  She was also advised to use her hemiwalker at all times while walking and we also discussed fall prevention precautions.  She was also neurologically cleared to undergo root canal with a small but acceptable periprocedural risk of stopping aspirin for a few days for Brenda procedure.  She will return for follow-up in 6 months or call earlier if necessary. Greater than 50% time during this 25-minute consultation visit was spent on counseling and coordination of care about her remote stroke , peri-operative stroke risk, and residual aphasia and hemiplegia and answering questions no scheduled follow-up appointment is necessary but she may return as needed only.  But to follow fall and safety precautions. Antony Contras, MD  [facility] [address] [place], [place] [address]  Phone [phone] Fax [phone] Note: This document was prepared with digital dictation and possible smart phrase technology. Any transcriptional errors that result from this process are unintentional.

## 2019-07-07 NOTE — Patient Instructions (Signed)
I had a long discussion the patient and her daughter regarding her behavioral changes after starting Keppra and agree with tapering and discontinuing Keppra over the next 2 weeks.  I advised her to take Keppra XR 250 every other day for 2 weeks and then stop.  If she has breakthrough seizure may continue starting alternative agents like Depakote ER in the future.  Check EEG to look for any brain irritability as well as screening follow-up carotid ultrasound study.  Continue aspirin for stroke prevention and aggressive risk factor modification with strict control of hypertension with blood pressure goal below 130/90, lipids with LDL cholesterol goal below 70 mg percent and diabetes with hemoglobin A1c goal below 6.5%.  She was also advised to use her hemiwalker at all times while walking and we also discussed fall prevention precautions.  She was also neurologically cleared to undergo root canal with a small but acceptable periprocedural risk of stopping aspirin for a few days for the procedure.  She will return for follow-up in 6 months or call earlier if necessary.

## 2019-07-09 ENCOUNTER — Encounter: Payer: Self-pay | Admitting: Podiatry

## 2019-07-09 ENCOUNTER — Ambulatory Visit: Payer: Medicaid Other | Admitting: Podiatry

## 2019-07-09 ENCOUNTER — Other Ambulatory Visit: Payer: Self-pay

## 2019-07-09 DIAGNOSIS — E1142 Type 2 diabetes mellitus with diabetic polyneuropathy: Secondary | ICD-10-CM | POA: Diagnosis not present

## 2019-07-09 DIAGNOSIS — M79675 Pain in left toe(s): Secondary | ICD-10-CM

## 2019-07-09 DIAGNOSIS — M79674 Pain in right toe(s): Secondary | ICD-10-CM

## 2019-07-09 DIAGNOSIS — B351 Tinea unguium: Secondary | ICD-10-CM

## 2019-07-09 NOTE — Patient Instructions (Signed)
Diabetes Mellitus and Foot Care Foot care is an important part of your health, especially when you have diabetes. Diabetes may cause you to have problems because of poor blood flow (circulation) to your feet and legs, which can cause your skin to:  Become thinner and drier.  Break more easily.  Heal more slowly.  Peel and crack. You may also have nerve damage (neuropathy) in your legs and feet, causing decreased feeling in them. This means that you may not notice minor injuries to your feet that could lead to more serious problems. Noticing and addressing any potential problems early is the best way to prevent future foot problems. How to care for your feet Foot hygiene  Wash your feet daily with warm water and mild soap. Do not use hot water. Then, pat your feet and the areas between your toes until they are completely dry. Do not soak your feet as this can dry your skin.  Trim your toenails straight across. Do not dig under them or around the cuticle. File the edges of your nails with an emery board or nail file.  Apply a moisturizing lotion or petroleum jelly to the skin on your feet and to dry, brittle toenails. Use lotion that does not contain alcohol and is unscented. Do not apply lotion between your toes. Shoes and socks  Wear clean socks or stockings every day. Make sure they are not too tight. Do not wear knee-high stockings since they may decrease blood flow to your legs.  Wear shoes that fit properly and have enough cushioning. Always look in your shoes before you put them on to be sure there are no objects inside.  To break in new shoes, wear them for just a few hours a day. This prevents injuries on your feet. Wounds, scrapes, corns, and calluses  Check your feet daily for blisters, cuts, bruises, sores, and redness. If you cannot see the bottom of your feet, use a mirror or ask someone for help.  Do not cut corns or calluses or try to remove them with medicine.  If you  find a minor scrape, cut, or break in the skin on your feet, keep it and the skin around it clean and dry. You may clean these areas with mild soap and water. Do not clean the area with peroxide, alcohol, or iodine.  If you have a wound, scrape, corn, or callus on your foot, look at it several times a day to make sure it is healing and not infected. Check for: ? Redness, swelling, or pain. ? Fluid or blood. ? Warmth. ? Pus or a bad smell. General instructions  Do not cross your legs. This may decrease blood flow to your feet.  Do not use heating pads or hot water bottles on your feet. They may burn your skin. If you have lost feeling in your feet or legs, you may not know this is happening until it is too late.  Protect your feet from hot and cold by wearing shoes, such as at the beach or on hot pavement.  Schedule a complete foot exam at least once a year (annually) or more often if you have foot problems. If you have foot problems, report any cuts, sores, or bruises to your health care provider immediately. Contact a health care provider if:  You have a medical condition that increases your risk of infection and you have any cuts, sores, or bruises on your feet.  You have an injury that is not   healing.  You have redness on your legs or feet.  You feel burning or tingling in your legs or feet.  You have pain or cramps in your legs and feet.  Your legs or feet are numb.  Your feet always feel cold.  You have pain around a toenail. Get help right away if:  You have a wound, scrape, corn, or callus on your foot and: ? You have pain, swelling, or redness that gets worse. ? You have fluid or blood coming from the wound, scrape, corn, or callus. ? Your wound, scrape, corn, or callus feels warm to the touch. ? You have pus or a bad smell coming from the wound, scrape, corn, or callus. ? You have a fever. ? You have a red line going up your leg. Summary  Check your feet every day  for cuts, sores, red spots, swelling, and blisters.  Moisturize feet and legs daily.  Wear shoes that fit properly and have enough cushioning.  If you have foot problems, report any cuts, sores, or bruises to your health care provider immediately.  Schedule a complete foot exam at least once a year (annually) or more often if you have foot problems. This information is not intended to replace advice given to you by your health care provider. Make sure you discuss any questions you have with your health care provider. Document Revised: 11/27/2018 Document Reviewed: 04/07/2016 Elsevier Patient Education  2020 [employer]  Peripheral Neuropathy Peripheral neuropathy is a type of nerve damage. It affects nerves that carry signals between the spinal cord and the arms, legs, and the rest of the body (peripheral nerves). It does not affect nerves in the spinal cord or brain. In peripheral neuropathy, one nerve or a group of nerves may be damaged. Peripheral neuropathy is a broad category that includes many specific nerve disorders, like diabetic neuropathy, hereditary neuropathy, and carpal tunnel syndrome. What are the causes? This condition may be caused by:  Diabetes. This is the most common cause of peripheral neuropathy.  Nerve injury.  Pressure or stress on a nerve that lasts a long time.  Lack (deficiency) of B vitamins. This can result from alcoholism, poor diet, or a restricted diet.  Infections.  Autoimmune diseases, such as rheumatoid arthritis and systemic lupus erythematosus.  Nerve diseases that are passed from parent to child (inherited).  Some medicines, such as cancer medicines (chemotherapy).  Poisonous (toxic) substances, such as lead and mercury.  Too little blood flowing to the legs.  Kidney disease.  Thyroid disease. In some cases, the cause of this condition is not known. What are the signs or symptoms? Symptoms of this condition depend on which of your  nerves is damaged. Common symptoms include:  Loss of feeling (numbness) in the feet, hands, or both.  Tingling in the feet, hands, or both.  Burning pain.  Very sensitive skin.  Weakness.  Not being able to move a part of the body (paralysis).  Muscle twitching.  Clumsiness or poor coordination.  Loss of balance.  Not being able to control your bladder.  Feeling dizzy.  Sexual problems. How is this diagnosed? Diagnosing and finding the cause of peripheral neuropathy can be difficult. Your health care provider will take your medical history and do a physical exam. A neurological exam will also be done. This involves checking things that are affected by your brain, spinal cord, and nerves (nervous system). For example, your health care provider will check your reflexes, how you move, and   what you can feel. You may have other tests, such as:  Blood tests.  Electromyogram (EMG) and nerve conduction tests. These tests check nerve function and how well the nerves are controlling the muscles.  Imaging tests, such as CT scans or MRI to rule out other causes of your symptoms.  Removing a small piece of nerve to be examined in a lab (nerve biopsy). This is rare.  Removing and examining a small amount of the fluid that surrounds the brain and spinal cord (lumbar puncture). This is rare. How is this treated? Treatment for this condition may involve:  Treating the underlying cause of the neuropathy, such as diabetes, kidney disease, or vitamin deficiencies.  Stopping medicines that can cause neuropathy, such as chemotherapy.  Medicine to relieve pain. Medicines may include: ? Prescription or over-the-counter pain medicine. ? Antiseizure medicine. ? Antidepressants. ? Pain-relieving patches that are applied to painful areas of skin.  Surgery to relieve pressure on a nerve or to destroy a nerve that is causing pain.  Physical therapy to help improve movement and  balance.  Devices to help you move around (assistive devices). Follow these instructions at home: Medicines  Take over-the-counter and prescription medicines only as told by your health care provider. Do not take any other medicines without first asking your health care provider.  Do not drive or use heavy machinery while taking prescription pain medicine. Lifestyle   Do not use any products that contain nicotine or tobacco, such as cigarettes and e-cigarettes. Smoking keeps blood from reaching damaged nerves. If you need help quitting, ask your health care provider.  Avoid or limit alcohol. Too much alcohol can cause a vitamin B deficiency, and vitamin B is needed for healthy nerves.  Eat a healthy diet. This includes: ? Eating foods that are high in fiber, such as fresh fruits and vegetables, whole grains, and beans. ? Limiting foods that are high in fat and processed sugars, such as fried or sweet foods. General instructions   If you have diabetes, work closely with your health care provider to keep your blood sugar under control.  If you have numbness in your feet: ? Check every day for signs of injury or infection. Watch for redness, warmth, and swelling. ? Wear padded socks and comfortable shoes. These help protect your feet.  Develop a good support system. Living with peripheral neuropathy can be stressful. Consider talking with a mental health specialist or joining a support group.  Use assistive devices and attend physical therapy as told by your health care provider. This may include using a walker or a cane.  Keep all follow-up visits as told by your health care provider. This is important. Contact a health care provider if:  You have new signs or symptoms of peripheral neuropathy.  You are struggling emotionally from dealing with peripheral neuropathy.  Your pain is not well-controlled. Get help right away if:  You have an injury or infection that is not healing  normally.  You develop new weakness in an arm or leg.  You fall frequently. Summary  Peripheral neuropathy is when the nerves in the arms, or legs are damaged, resulting in numbness, weakness, or pain.  There are many causes of peripheral neuropathy, including diabetes, pinched nerves, vitamin deficiencies, autoimmune disease, and hereditary conditions.  Diagnosing and finding the cause of peripheral neuropathy can be difficult. Your health care provider will take your medical history, do a physical exam, and do tests, including blood tests and nerve function tests.    Treatment involves treating the underlying cause of the neuropathy and taking medicines to help control pain. Physical therapy and assistive devices may also help. This information is not intended to replace advice given to you by your health care provider. Make sure you discuss any questions you have with your health care provider. Document Revised: 02/16/2017 Document Reviewed: 05/15/2016 Elsevier Patient Education  2020 Elsevier Inc.  

## 2019-07-13 NOTE — Progress Notes (Signed)
Subjective: [name] presents today for follow up of preventative diabetic foot care and painful mycotic nails b/l that are difficult to trim. Pain interferes with ambulation. Aggravating factors include wearing enclosed shoe gear. Pain is relieved with periodic professional debridement.  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  Her daughter is present during the visit. They voice no new pedal problems on today's visit.   No Known Allergies   Objective: There were no vitals filed for this visit.  Pt is pleasant 59 y.o. year old AA female in NAD. AAO x 3.   Vascular Examination:  Capillary fill time to digits <3 seconds b/l. Faintly palpable PT pulses b/l. Nonpalpable DP pulses b/l. Pedal hair sparse b/l. Skin temperature gradient within normal limits b/l. Unilateral edema right LE.  Dermatological Examination: Pedal skin with normal turgor, texture and tone bilaterally. No open wounds bilaterally. No interdigital macerations bilaterally. Toenails 1-5 b/l elongated, dystrophic, thickened, crumbly with subungual debris and tenderness to dorsal palpation.  Musculoskeletal: No gross bony deformities bilaterally. No pain crepitus or joint limitation noted with ROM b/l. Muscle strength 5/5 to muscle groups of LLE.  Flaccid right lower extremity secondary to CVA.  Neurological: Protective sensation diminished with 10g monofilament b/l. Vibratory sensation diminished b/l. Proprioception intact bilaterally.  Assessment: 1. Pain due to onychomycosis of toenails of both feet   2. Diabetic peripheral neuropathy associated with type 2 diabetes mellitus ([place])    Plan: -Continue diabetic foot care principles. Literature dispensed on today.  -Toenails 1-5 b/l were debrided in length and girth with sterile nail nippers and dremel without iatrogenic bleeding.  -Patient to continue soft, supportive shoe gear daily. -Patient to report any pedal injuries to  medical professional immediately. -Patient/POA to call should there be question/concern in the interim.  Return in about 3 months (around 10/08/2019) for diabetic nail trim.

## 2019-07-16 ENCOUNTER — Telehealth: Payer: Self-pay | Admitting: Neurology

## 2019-07-16 NOTE — Telephone Encounter (Signed)
approved [id number] dates 07/16/2019 -01/12/2020

## 2019-07-16 NOTE — Telephone Encounter (Signed)
Kylie from [facility] called stating that the pt is needing auth for her [facility]. Please advise.

## 2019-07-18 ENCOUNTER — Ambulatory Visit (HOSPITAL_COMMUNITY)
Admission: RE | Admit: 2019-07-18 | Discharge: 2019-07-18 | Disposition: A | Discharge status: Home / Self Care | Payer: Medicaid Other | Source: Ambulatory Visit | Attending: Neurology | Admitting: Neurology

## 2019-07-18 ENCOUNTER — Other Ambulatory Visit: Payer: Self-pay

## 2019-07-18 DIAGNOSIS — I69320 Aphasia following cerebral infarction: Secondary | ICD-10-CM | POA: Diagnosis not present

## 2019-07-18 DIAGNOSIS — G8110 Spastic hemiplegia affecting unspecified side: Secondary | ICD-10-CM | POA: Insufficient documentation

## 2019-07-18 NOTE — Progress Notes (Signed)
Carotid duplex has been completed.   Preliminary results in CV Proc.   Abram Sander 07/18/2019 2:06 PM

## 2019-07-25 NOTE — Progress Notes (Signed)
Kindly inform the patient that carotid ultrasound showed no significant narrowing of either carotid arteries in the neck.

## 2019-07-30 ENCOUNTER — Ambulatory Visit: Payer: Medicaid Other | Admitting: Neurology

## 2019-07-30 ENCOUNTER — Encounter: Payer: Self-pay | Admitting: *Deleted

## 2019-07-30 ENCOUNTER — Other Ambulatory Visit: Payer: Self-pay

## 2019-07-30 DIAGNOSIS — R4701 Aphasia: Secondary | ICD-10-CM

## 2019-07-30 DIAGNOSIS — I69320 Aphasia following cerebral infarction: Secondary | ICD-10-CM

## 2019-07-30 DIAGNOSIS — G8110 Spastic hemiplegia affecting unspecified side: Secondary | ICD-10-CM

## 2019-08-12 NOTE — Progress Notes (Signed)
Kindly inform the patient that EEG study showed mild irritability of the brain on the left side but no definite seizure activity.  Nothing to worry about

## 2019-08-18 DIAGNOSIS — Z8673 Personal history of transient ischemic attack (TIA), and cerebral infarction without residual deficits: Secondary | ICD-10-CM | POA: Insufficient documentation

## 2019-09-16 ENCOUNTER — Other Ambulatory Visit: Payer: Self-pay | Admitting: Family Medicine

## 2019-09-16 DIAGNOSIS — Z1231 Encounter for screening mammogram for malignant neoplasm of breast: Secondary | ICD-10-CM

## 2019-10-08 ENCOUNTER — Other Ambulatory Visit: Payer: Self-pay

## 2019-10-08 ENCOUNTER — Encounter: Payer: Self-pay | Admitting: Podiatry

## 2019-10-08 ENCOUNTER — Ambulatory Visit: Payer: Medicaid Other | Admitting: Podiatry

## 2019-10-08 DIAGNOSIS — M79674 Pain in right toe(s): Secondary | ICD-10-CM | POA: Diagnosis not present

## 2019-10-08 DIAGNOSIS — B351 Tinea unguium: Secondary | ICD-10-CM

## 2019-10-08 DIAGNOSIS — M79675 Pain in left toe(s): Secondary | ICD-10-CM | POA: Diagnosis not present

## 2019-10-08 DIAGNOSIS — E1142 Type 2 diabetes mellitus with diabetic polyneuropathy: Secondary | ICD-10-CM | POA: Diagnosis not present

## 2019-10-12 NOTE — Progress Notes (Signed)
Subjective:  Patient ID: [name], female    DOB: Jan 11, 1961,  MRN: [id number]  59 y.o. female presents with at risk foot care with history of diabetic neuropathy and painful thick toenails that are difficult to trim. Pain interferes with ambulation. Aggravating factors include wearing enclosed shoe gear. Pain is relieved with periodic professional debridement.   Daughter is present during today's visit. Daughter is concerned about left great toe. [name] relates toe is tender. Daughter denies any drainage.  Review of Systems: Negative except as noted in the HPI. Denies N/V/F/Ch. Past Medical History:  Diagnosis Date  . Aphasia due to stroke   . Bipolar affective disorder ([place])   . Depression   . Diabetes mellitus ([place])    ?type (05/29/2013)  . Episodic confusion   . 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 Hypertension   . Schizophrenia ([place])    [name] 11/28/2007 (05/29/2013)  . Seizures ([place])   . Stroke [facility]) 05/27/2013   Past Surgical History:  Procedure Laterality Date  . BREAST BIOPSY Right   . CESAREAN SECTION  ~ 46  . TEE WITHOUT CARDIOVERSION N/A 12/08/2013   Procedure: TRANSESOPHAGEAL ECHOCARDIOGRAM (TEE);  Surgeon: Candee Furbish, MD;  Location: [facility] ENDOSCOPY;  Service: Cardiovascular;  Laterality: N/A;  . TUBAL LIGATION     Patient Active Problem List   Diagnosis Date Noted  . Preop examination 07/19/2018  . Postmenopausal bleeding 03/07/2018  . Mixed hyperlipidemia 10/03/2017  . Chronic edema 09/25/2016  . Lymphedema of right lower extremity 08/09/2016  . Hemiparesis, aphasia, and dysphagia as late effect of cerebrovascular accident (CVA) ([place]) 06/23/2016  . Seizures ([place]) 09/29/2015  . Urine leukocytes 01/08/2015  . Expressive aphasia 01/08/2015  . Malnutrition of moderate degree ([place]) 11/11/2014  . Syncope 11/08/2014  . Dehydration 11/08/2014  . Staring spell 07/09/2014  . Adhesive capsulitis of right shoulder 06/04/2014   . Palpitations 04/02/2014  . Essential hypertension 01/05/2014  . DM (diabetes mellitus) ([place]) 01/05/2014  . Paranoid schizophrenia ([place]) 12/28/2013  . Cerebral embolism with cerebral infarction ([place]) 12/05/2013  . Sinus tachycardia 12/05/2013  . 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 Spastic hemiplegia affecting dominant side ([place]) 09/12/2013  . CVA (cerebral infarction) 06/04/2013  . Dysphagia 06/02/2013  . Benign polycythemia 06/02/2013  . Malignant hypertension 05/28/2013  . DM (diabetes mellitus) type II controlled, neurological manifestation ([place]) 05/27/2013  . Acute embolic stroke ([place]) 27/74/1287  . Aphasia 05/27/2013  . 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 Hypertensive emergency 05/27/2013  . Stroke ([place]) 05/27/2013  . Aphasia complicating stroke 86/76/7209    Current Outpatient Medications:  .  ACCU-CHEK AVIVA PLUS test strip, USE ONE STRIP TO TEST BEFORE MEALS AND AND AT BEDTIME, Disp: 100 each, Rfl: 5 .  ACCU-CHEK SOFTCLIX LANCETS lancets, FOR TEST DOSE BEFORE MEALS AND AT BEDTIME, Disp: 100 each, Rfl: 5 .  acetaminophen (TYLENOL) 325 MG tablet, Take 1-2 tablets (325-650 mg total) by mouth every 4 (four) hours as needed for mild pain (available over the counter)., Disp: , Rfl:  .  amLODipine (NORVASC) 10 MG tablet, Take 1 tablet (10 mg total) by mouth daily., Disp: 30 tablet, Rfl: 5 .  aspirin EC 81 MG tablet, Take by mouth., Disp: , Rfl:  .  atorvastatin (LIPITOR) 20 MG tablet, Take 1 tablet (20 mg total) by mouth daily., Disp: 30 tablet, Rfl: 5 .  baclofen (LIORESAL) 10 MG  tablet, TAKE ONE (1) TABLET BY MOUTH TWO (2) TIMES DAILY TO HELP WITH TONE/SPASMS, Disp: 60 each, Rfl: 2 .  benzonatate (TESSALON) 100 MG capsule, Take 1 capsule (100 mg total) by mouth every 8 (eight) hours., Disp: 21 capsule, Rfl: 0 .  cetirizine (ZYRTEC) 10 MG tablet, TAKE ONE (1) TABLET BY MOUTH  EVERY DAY, Disp: 30 tablet, Rfl: 3 .  gabapentin (NEURONTIN) 100 MG capsule, Take 1 capsule (100 mg total) by mouth 3 (three) times daily., Disp: 90 capsule, Rfl: 5 .  glucose blood test strip, USE TO TEST BLOOD SUGARS BEFORE MEALS AND AT BEDTIME, Disp: , Rfl:  .  hydrochlorothiazide (HYDRODIURIL) 25 MG tablet, TAKE ONE (1) TABLET BY MOUTH EVERY DAY (Patient taking differently: Take 25 mg by mouth daily. ), Disp: 30 tablet, Rfl: 5 .  ibuprofen (ADVIL,MOTRIN) 400 MG tablet, Take 1 tablet (400 mg total) by mouth every 6 (six) hours as needed., Disp: 30 tablet, Rfl: 0 .  insulin aspart (NOVOLOG) 100 UNIT/ML injection, 0-12 units as per sliding scale (Patient taking differently: Inject 0-12 Units into the skin 3 (three) times daily with meals. per sliding scale), Disp: 10 mL, Rfl: 3 .  LANTUS SOLOSTAR 100 UNIT/ML Solostar Pen, Inject 24 Units into the skin daily. , Disp: , Rfl:  .  levETIRAcetam (KEPPRA) 500 MG tablet, TAKE 1 TABLET BY MOUTH TWICE A DAY (Patient taking differently: 250 mg 2 (two) times daily. ), Disp: 60 tablet, Rfl: 3 .  lubiprostone (AMITIZA) 8 MCG capsule, Take 1 capsule (8 mcg total) by mouth 2 (two) times daily with a meal., Disp: 60 capsule, Rfl: 11 .  metoprolol (LOPRESSOR) 100 MG tablet, Take 1.5 tablets (150 mg total) by mouth 2 (two) times daily., Disp: 90 tablet, Rfl: 5 .  nitrofurantoin, macrocrystal-monohydrate, (MACROBID) 100 MG capsule, Take 1 capsule (100 mg total) by mouth 2 (two) times daily., Disp: 10 capsule, Rfl: 0 .  nystatin (MYCOSTATIN/NYSTOP) powder, Apply topically 2 (two) times daily., Disp: 45 g, Rfl: 0 .  polyethylene glycol powder (GLYCOLAX/MIRALAX) powder, Take 17 g by mouth daily as needed (constipation)., Disp: 527 g, Rfl: 3 .  senna (SENOKOT) 8.6 MG tablet, Take one or two tablets by mouth daily as needed for constipation, Disp: , Rfl:  .  sodium fluoride (PREVIDENT) 1.1 % GEL dental gel, USE AS DIRECTED., Disp: , Rfl:  .  Spacer/Aero-Holding Chambers  (E-Z SPACER) inhaler, Use as instructed, Disp: 1 each, Rfl: 2 .  UNIFINE PENTIPS 31G X 6 MM MISC, USE AS PRESCRIBED BY PHYSICIAN, Disp: 100 each, Rfl: 11 No Known Allergies Social History   Tobacco Use  Smoking Status Never Smoker  Smokeless Tobacco Never Used   Objective:  There were no vitals filed for this visit. Constitutional Patient is a pleasant 59 y.o. African American female, obese in NAD.[place] Kitchen  Vascular Capillary fill time to digits <3 seconds b/l lower extremities. Faintly palpable PT pulse(s) b/l lower extremities. Nonpalpable DP pulse(s) b/l lower extremities. Pedal hair absent. Lower extremity skin temperature gradient within normal limits. No pain with calf compression b/l. Nonpitting edema noted right lower extremity. No cyanosis or clubbing noted.  Neurologic Normal speech. Oriented to person, place, and time. Protective sensation diminished with 10g monofilament b/l. Vibratory sensation diminished b/l.  Dermatologic Pedal skin with normal turgor, texture and tone bilaterally. No open wounds bilaterally. No interdigital macerations bilaterally. Toenails 1-5 b/l elongated, discolored, dystrophic, thickened, crumbly with subungual debris and tenderness to dorsal palpation. Incurvated nailplate medial border(s) L hallux.  Nail border hypertrophy minimal. There is tenderness to palpation. Sign(s) of infection: no clinical signs of infection noted on examination today..  Orthopedic: Muscle strength 5/5 to all LE muscle groups of left lower extremity. Flaccid lower extremities RLE. No pain crepitus or joint limitation noted with ROM b/l. No gross bony deformities bilaterally.    Assessment:  No diagnosis found. Plan:  Patient was evaluated and treated and all questions answered.  Onychomycosis with pain -Nails palliatively debridement as below. -Educated on self-care  Procedure: Nail Debridement Rationale: Pain Type of Debridement: manual, sharp debridement. Instrumentation:  Nail nipper, rotary burr. Number of Nails: 10  -Examined patient. -Continue diabetic foot care principles. -Toenails 1-5 b/l were debrided in length and girth with sterile nail nippers and dremel without iatrogenic bleeding.  -Offending nail border debrided and curretaged L hallux utilizing sterile nail nipper and currette. Border cleansed with alcohol and triple antibiotic applied. Daughter instructed to apply Neosporin to left great toe once daily. -Patient to report any pedal injuries to medical professional immediately. -Patient to continue soft, supportive shoe gear daily. -Patient/POA to call should there be question/concern in the interim.  Return in about 3 months (around 01/08/2020) for diabetic nail and callus trim.  Marzetta Board, DPM

## 2019-10-20 ENCOUNTER — Ambulatory Visit: Payer: Medicaid Other

## 2019-11-25 ENCOUNTER — Ambulatory Visit
Admission: RE | Admit: 2019-11-25 | Discharge: 2019-11-25 | Disposition: A | Discharge status: Home / Self Care | Payer: Medicaid Other | Source: Ambulatory Visit | Attending: Family Medicine | Admitting: Family Medicine

## 2019-11-25 ENCOUNTER — Other Ambulatory Visit: Payer: Self-pay

## 2019-11-25 DIAGNOSIS — Z1231 Encounter for screening mammogram for malignant neoplasm of breast: Secondary | ICD-10-CM

## 2020-01-05 ENCOUNTER — Ambulatory Visit: Payer: Medicaid Other | Admitting: Neurology

## 2020-01-09 ENCOUNTER — Other Ambulatory Visit: Payer: Self-pay

## 2020-01-09 ENCOUNTER — Ambulatory Visit (INDEPENDENT_AMBULATORY_CARE_PROVIDER_SITE_OTHER): Payer: Medicaid Other | Admitting: Podiatry

## 2020-01-09 ENCOUNTER — Encounter: Payer: Self-pay | Admitting: Podiatry

## 2020-01-09 DIAGNOSIS — M79674 Pain in right toe(s): Secondary | ICD-10-CM

## 2020-01-09 DIAGNOSIS — B351 Tinea unguium: Secondary | ICD-10-CM | POA: Diagnosis not present

## 2020-01-09 DIAGNOSIS — M79675 Pain in left toe(s): Secondary | ICD-10-CM | POA: Diagnosis not present

## 2020-01-09 DIAGNOSIS — E1142 Type 2 diabetes mellitus with diabetic polyneuropathy: Secondary | ICD-10-CM

## 2020-01-11 NOTE — Progress Notes (Addendum)
Subjective:  Patient ID: [name], female    DOB: 03-28-1960,  MRN: [id number]  59 y.o. female presents with at risk foot care with history of diabetic neuropathy and painful thick toenails that are difficult to trim. Pain interferes with ambulation. Aggravating factors include wearing enclosed shoe gear. Pain is relieved with periodic professional debridement.   Daughter is present during today's visit. Her blood sugar was 120 mg/dl. Daughter states Mom is concerned about skin on right ankle.  Review of Systems: Negative except as noted in the HPI. Denies N/V/F/Ch. Past Medical History:  Diagnosis Date  . Aphasia due to stroke   . Bipolar affective disorder ([place])   . Depression   . Diabetes mellitus ([place])    ?type (05/29/2013)  . Episodic confusion   . 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 Hypertension   . Schizophrenia ([place])    [name] 11/28/2007 (05/29/2013)  . Seizures ([place])   . Stroke [facility]) 05/27/2013   Past Surgical History:  Procedure Laterality Date  . BREAST BIOPSY Right   . CESAREAN SECTION  ~ 7  . TEE WITHOUT CARDIOVERSION N/A 12/08/2013   Procedure: TRANSESOPHAGEAL ECHOCARDIOGRAM (TEE);  Surgeon: Candee Furbish, MD;  Location: [facility] ENDOSCOPY;  Service: Cardiovascular;  Laterality: N/A;  . TUBAL LIGATION     Patient Active Problem List   Diagnosis Date Noted  . Atypical glandular cells of undetermined significance (AGUS) on cervical Pap smear 03/19/2019  . Preop examination 07/19/2018  . Postmenopausal bleeding 03/07/2018  . Mixed hyperlipidemia 10/03/2017  . Chronic edema 09/25/2016  . Lymphedema of right lower extremity 08/09/2016  . Hemiparesis, aphasia, and dysphagia as late effect of cerebrovascular accident (CVA) ([place]) 06/23/2016  . Seizures ([place]) 09/29/2015  . Urine leukocytes 01/08/2015  . Expressive aphasia 01/08/2015  . Malnutrition of moderate degree ([place]) 11/11/2014  . Syncope 11/08/2014  . Dehydration 11/08/2014   . Staring spell 07/09/2014  . Adhesive capsulitis of right shoulder 06/04/2014  . Palpitations 04/02/2014  . Essential hypertension 01/05/2014  . DM (diabetes mellitus) ([place]) 01/05/2014  . Paranoid schizophrenia ([place]) 12/28/2013  . Cerebral embolism with cerebral infarction ([place]) 12/05/2013  . Sinus tachycardia 12/05/2013  . 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 Spastic hemiplegia affecting dominant side ([place]) 09/12/2013  . CVA (cerebral infarction) 06/04/2013  . Dysphagia 06/02/2013  . Benign polycythemia 06/02/2013  . Malignant hypertension 05/28/2013  . DM (diabetes mellitus) type II controlled, neurological manifestation ([place]) 05/27/2013  . Acute embolic stroke ([place]) 40/98/1191  . Aphasia 05/27/2013  . 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 Hypertensive emergency 05/27/2013  . Stroke ([place]) 05/27/2013  . Aphasia complicating stroke 47/82/9562    Current Outpatient Medications:  .  ACCU-CHEK AVIVA PLUS test strip, USE ONE STRIP TO TEST BEFORE MEALS AND AND AT BEDTIME, Disp: 100 each, Rfl: 5 .  ACCU-CHEK SOFTCLIX LANCETS lancets, FOR TEST DOSE BEFORE MEALS AND AT BEDTIME, Disp: 100 each, Rfl: 5 .  acetaminophen (TYLENOL) 325 MG tablet, Take 1-2 tablets (325-650 mg total) by mouth every 4 (four) hours as needed for mild pain (available over the counter)., Disp: , Rfl:  .  amLODipine (NORVASC) 10 MG tablet, Take 1 tablet by mouth daily., Disp: , Rfl:  .  aspirin EC 81 MG tablet, Take by mouth., Disp: , Rfl:  .  atorvastatin (LIPITOR) 20 MG tablet, Take 1 tablet (20 mg total) by mouth daily., Disp: 30  tablet, Rfl: 5 .  baclofen (LIORESAL) 10 MG tablet, TAKE ONE (1) TABLET BY MOUTH TWO (2) TIMES DAILY TO HELP WITH TONE/SPASMS, Disp: 60 each, Rfl: 2 .  benzonatate (TESSALON) 100 MG capsule, Take 1 capsule (100 mg total) by mouth every 8 (eight) hours., Disp: 21 capsule, Rfl: 0 .   cetirizine (ZYRTEC) 10 MG tablet, TAKE ONE (1) TABLET BY MOUTH EVERY DAY, Disp: 30 tablet, Rfl: 3 .  gabapentin (NEURONTIN) 100 MG capsule, Take 1 capsule (100 mg total) by mouth 3 (three) times daily., Disp: 90 capsule, Rfl: 5 .  glucose blood test strip, USE TO TEST BLOOD SUGARS BEFORE MEALS AND AT BEDTIME, Disp: , Rfl:  .  hydrochlorothiazide (HYDRODIURIL) 25 MG tablet, TAKE ONE (1) TABLET BY MOUTH EVERY DAY (Patient taking differently: Take 25 mg by mouth daily. ), Disp: 30 tablet, Rfl: 5 .  ibuprofen (ADVIL,MOTRIN) 400 MG tablet, Take 1 tablet (400 mg total) by mouth every 6 (six) hours as needed., Disp: 30 tablet, Rfl: 0 .  insulin aspart (NOVOLOG) 100 UNIT/ML injection, 0-12 units as per sliding scale (Patient taking differently: Inject 0-12 Units into the skin 3 (three) times daily with meals. per sliding scale), Disp: 10 mL, Rfl: 3 .  LANTUS SOLOSTAR 100 UNIT/ML Solostar Pen, Inject 24 Units into the skin daily. , Disp: , Rfl:  .  levETIRAcetam (KEPPRA) 500 MG tablet, TAKE 1 TABLET BY MOUTH TWICE A DAY (Patient taking differently: 250 mg 2 (two) times daily. ), Disp: 60 tablet, Rfl: 3 .  lubiprostone (AMITIZA) 8 MCG capsule, Take 1 capsule (8 mcg total) by mouth 2 (two) times daily with a meal., Disp: 60 capsule, Rfl: 11 .  metoprolol (LOPRESSOR) 100 MG tablet, Take 1.5 tablets (150 mg total) by mouth 2 (two) times daily., Disp: 90 tablet, Rfl: 5 .  metoprolol tartrate (LOPRESSOR) 50 MG tablet, Take 1 tablet by mouth 2 (two) times daily., Disp: , Rfl:  .  nitrofurantoin, macrocrystal-monohydrate, (MACROBID) 100 MG capsule, Take 1 capsule (100 mg total) by mouth 2 (two) times daily., Disp: 10 capsule, Rfl: 0 .  nystatin (MYCOSTATIN/NYSTOP) powder, Apply topically 2 (two) times daily., Disp: 45 g, Rfl: 0 .  polyethylene glycol powder (GLYCOLAX/MIRALAX) powder, Take 17 g by mouth daily as needed (constipation)., Disp: 527 g, Rfl: 3 .  senna (SENOKOT) 8.6 MG tablet, Take one or two tablets by  mouth daily as needed for constipation, Disp: , Rfl:  .  sodium fluoride (PREVIDENT) 1.1 % GEL dental gel, USE AS DIRECTED., Disp: , Rfl:  .  Spacer/Aero-Holding Chambers (E-Z SPACER) inhaler, Use as instructed, Disp: 1 each, Rfl: 2 .  UNIFINE PENTIPS 31G X 6 MM MISC, USE AS PRESCRIBED BY PHYSICIAN, Disp: 100 each, Rfl: 11 .  doxycycline (VIBRAMYCIN) 100 MG capsule, Take 100 mg by mouth 2 (two) times daily., Disp: , Rfl:  .  ibuprofen (ADVIL) 600 MG tablet, Take 600 mg by mouth every 8 (eight) hours as needed., Disp: , Rfl:  .  medroxyPROGESTERone (PROVERA) 10 MG tablet, SMARTSIG:1 Tablet(s) By Mouth Morning-Night, Disp: , Rfl:  .  Multiple Vitamin (MULTI-VITAMIN) tablet, Take 1 tablet by mouth daily., Disp: , Rfl:  .  penicillin v potassium (VEETID) 500 MG tablet, Take 500 mg by mouth every 6 (six) hours., Disp: , Rfl:  No Known Allergies Social History   Tobacco Use  Smoking Status Never Smoker  Smokeless Tobacco Never Used   Objective:  There were no vitals filed for this visit. Constitutional Patient is  a pleasant 59 y.o. African American female, obese in NAD.Marland Kitchen  Vascular Capillary fill time to digits <3 seconds b/l lower extremities. Faintly palpable PT pulse(s) b/l lower extremities. Nonpalpable DP pulse(s) b/l lower extremities. Pedal hair absent. Lower extremity skin temperature gradient within normal limits. No pain with calf compression b/l. Nonpitting edema noted right lower extremity. No cyanosis or clubbing noted.  Neurologic Normal speech. Oriented to person, place, and time. Protective sensation diminished with 10g monofilament b/l. Vibratory sensation diminished b/l.  Dermatologic Pedal skin with normal turgor, texture and tone bilaterally. No open wounds bilaterally. No interdigital macerations bilaterally. Toenails 1-5 b/l elongated, discolored, dystrophic, thickened, crumbly with subungual debris and tenderness to dorsal palpation. Incurvated nailplate medial border(s) L  hallux.  Nail border hypertrophy minimal. There is tenderness to palpation. Sign(s) of infection: no clinical signs of infection noted on examination today..She has some residual flaky skin on the lateral aspect of her ankle.  Orthopedic: Muscle strength 5/5 to all LE muscle groups of left lower extremity. Flaccid lower extremities RLE. No pain crepitus or joint limitation noted with ROM b/l. No gross bony deformities bilaterally.    Assessment:   1. Pain due to onychomycosis of toenails of both feet   2. Diabetic peripheral neuropathy associated with type 2 diabetes mellitus ([place])    Plan:  Patient was evaluated and treated and all questions answered.  Onychomycosis with pain -Nails palliatively debridement as below. -Educated on self-care  Procedure: Nail Debridement Rationale: Pain Type of Debridement: manual, sharp debridement. Instrumentation: Nail nipper, rotary burr. Number of Nails: 10  -Examined patient. -Continue diabetic foot care principles. -Toenails 1-5 b/l were debrided in length and girth with sterile nail nippers and dremel without iatrogenic bleeding.  -Daughter to continue. -For dry flakes, daughter to continue to wash right foot with Dove soap and baby brush, followed by moisturizer. -Patient to report any pedal injuries to medical professional immediately. -Patient to continue soft, supportive shoe gear daily. -Patient/POA to call should there be question/concern in the interim.  Return in about 3 months (around 04/10/2020).  Marzetta Board, DPM

## 2020-04-16 ENCOUNTER — Ambulatory Visit (INDEPENDENT_AMBULATORY_CARE_PROVIDER_SITE_OTHER): Payer: Medicaid Other | Admitting: Podiatry

## 2020-04-16 ENCOUNTER — Other Ambulatory Visit: Payer: Self-pay

## 2020-04-16 ENCOUNTER — Encounter: Payer: Self-pay | Admitting: Podiatry

## 2020-04-16 DIAGNOSIS — M79674 Pain in right toe(s): Secondary | ICD-10-CM | POA: Diagnosis not present

## 2020-04-16 DIAGNOSIS — E1142 Type 2 diabetes mellitus with diabetic polyneuropathy: Secondary | ICD-10-CM

## 2020-04-16 DIAGNOSIS — B351 Tinea unguium: Secondary | ICD-10-CM

## 2020-04-16 DIAGNOSIS — M79675 Pain in left toe(s): Secondary | ICD-10-CM | POA: Diagnosis not present

## 2020-04-18 NOTE — Progress Notes (Signed)
Subjective:  Patient ID: [name], female    DOB: [birth year],  MRN: [id number]  60 y.o. female presents with at risk foot care with history of diabetic neuropathy and painful thick toenails that are difficult to trim. Pain interferes with ambulation. Aggravating factors include wearing enclosed shoe gear. Pain is relieved with periodic professional debridement.   Daughter is present during today's visit.   Review of Systems: Negative except as noted in the HPI. Denies N/V/F/Ch. Past Medical History:  Diagnosis Date  . Aphasia due to stroke   . Bipolar affective disorder ([place])   . Depression   . Diabetes mellitus ([place])    ?type (05/29/2013)  . Episodic confusion   . 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 Hypertension   . Schizophrenia ([place])    [name] 11/28/2007 (05/29/2013)  . Seizures ([place])   . Stroke [facility]) 05/27/2013   Past Surgical History:  Procedure Laterality Date  . BREAST BIOPSY Right   . CESAREAN SECTION  ~ 49  . TEE WITHOUT CARDIOVERSION N/A 12/08/2013   Procedure: TRANSESOPHAGEAL ECHOCARDIOGRAM (TEE);  Surgeon: Candee Furbish, MD;  Location: [facility] ENDOSCOPY;  Service: Cardiovascular;  Laterality: N/A;  . TUBAL LIGATION     Patient Active Problem List   Diagnosis Date Noted  . History of ischemic left MCA stroke 08/18/2019  . Atypical glandular cells of undetermined significance (AGUS) on cervical Pap smear 03/19/2019  . Preop examination 07/19/2018  . Postmenopausal bleeding 03/07/2018  . Mixed hyperlipidemia 10/03/2017  . Chronic edema 09/25/2016  . Lymphedema of right lower extremity 08/09/2016  . Hemiparesis, aphasia, and dysphagia as late effect of cerebrovascular accident (CVA) ([place]) 06/23/2016  . Seizures ([place]) 09/29/2015  . Urine leukocytes 01/08/2015  . Expressive aphasia 01/08/2015  . Malnutrition of moderate degree ([place]) 11/11/2014  . Syncope 11/08/2014  . Dehydration 11/08/2014  . Staring spell 07/09/2014  .  Adhesive capsulitis of right shoulder 06/04/2014  . Palpitations 04/02/2014  . Essential hypertension 01/05/2014  . DM (diabetes mellitus) ([place]) 01/05/2014  . Paranoid schizophrenia ([place]) 12/28/2013  . Cerebral embolism with cerebral infarction ([place]) 12/05/2013  . Sinus tachycardia 12/05/2013  . 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 Spastic hemiplegia affecting dominant side ([place]) 09/12/2013  . CVA (cerebral infarction) 06/04/2013  . Dysphagia 06/02/2013  . Benign polycythemia 06/02/2013  . Malignant hypertension 05/28/2013  . DM (diabetes mellitus) type II controlled, neurological manifestation ([place]) 05/27/2013  . Acute embolic stroke ([place]) 79/04/4095  . Aphasia 05/27/2013  . 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 Hypertensive emergency 05/27/2013  . Stroke ([place]) 05/27/2013  . Aphasia complicating stroke 35/32/9924    Current Outpatient Medications:  .  ACCU-CHEK AVIVA PLUS test strip, USE ONE STRIP TO TEST BEFORE MEALS AND AND AT BEDTIME, Disp: 100 each, Rfl: 5 .  ACCU-CHEK SOFTCLIX LANCETS lancets, FOR TEST DOSE BEFORE MEALS AND AT BEDTIME, Disp: 100 each, Rfl: 5 .  acetaminophen (TYLENOL) 325 MG tablet, Take 1-2 tablets (325-650 mg total) by mouth every 4 (four) hours as needed for mild pain (available over the counter)., Disp: , Rfl:  .  amLODipine (NORVASC) 10 MG tablet, Take 1 tablet by mouth daily., Disp: , Rfl:  .  aspirin EC 81 MG tablet, Take by mouth., Disp: , Rfl:  .  atorvastatin (LIPITOR) 20 MG tablet, Take 1 tablet (20 mg total) by mouth daily., Disp: 30 tablet, Rfl: 5 .  baclofen (  LIORESAL) 10 MG tablet, TAKE ONE (1) TABLET BY MOUTH TWO (2) TIMES DAILY TO HELP WITH TONE/SPASMS, Disp: 60 each, Rfl: 2 .  benzonatate (TESSALON) 100 MG capsule, Take 1 capsule (100 mg total) by mouth every 8 (eight) hours., Disp: 21 capsule, Rfl: 0 .  cetirizine (ZYRTEC) 10 MG tablet, TAKE  ONE (1) TABLET BY MOUTH EVERY DAY, Disp: 30 tablet, Rfl: 3 .  doxycycline (VIBRAMYCIN) 100 MG capsule, Take 100 mg by mouth 2 (two) times daily., Disp: , Rfl:  .  gabapentin (NEURONTIN) 100 MG capsule, Take 1 capsule (100 mg total) by mouth 3 (three) times daily., Disp: 90 capsule, Rfl: 5 .  glucose blood test strip, USE TO TEST BLOOD SUGARS BEFORE MEALS AND AT BEDTIME, Disp: , Rfl:  .  hydrochlorothiazide (HYDRODIURIL) 25 MG tablet, TAKE ONE (1) TABLET BY MOUTH EVERY DAY (Patient taking differently: Take 25 mg by mouth daily. ), Disp: 30 tablet, Rfl: 5 .  ibuprofen (ADVIL) 600 MG tablet, Take 600 mg by mouth every 8 (eight) hours as needed., Disp: , Rfl:  .  ibuprofen (ADVIL,MOTRIN) 400 MG tablet, Take 1 tablet (400 mg total) by mouth every 6 (six) hours as needed., Disp: 30 tablet, Rfl: 0 .  insulin aspart (NOVOLOG) 100 UNIT/ML injection, 0-12 units as per sliding scale (Patient taking differently: Inject 0-12 Units into the skin 3 (three) times daily with meals. per sliding scale), Disp: 10 mL, Rfl: 3 .  LANTUS SOLOSTAR 100 UNIT/ML Solostar Pen, Inject 24 Units into the skin daily. , Disp: , Rfl:  .  levETIRAcetam (KEPPRA) 500 MG tablet, TAKE 1 TABLET BY MOUTH TWICE A DAY (Patient taking differently: 250 mg 2 (two) times daily. ), Disp: 60 tablet, Rfl: 3 .  lubiprostone (AMITIZA) 8 MCG capsule, Take 1 capsule (8 mcg total) by mouth 2 (two) times daily with a meal., Disp: 60 capsule, Rfl: 11 .  medroxyPROGESTERone (PROVERA) 10 MG tablet, SMARTSIG:1 Tablet(s) By Mouth Morning-Night, Disp: , Rfl:  .  metoprolol (LOPRESSOR) 100 MG tablet, Take 1.5 tablets (150 mg total) by mouth 2 (two) times daily., Disp: 90 tablet, Rfl: 5 .  metoprolol tartrate (LOPRESSOR) 50 MG tablet, Take 1 tablet by mouth 2 (two) times daily., Disp: , Rfl:  .  Multiple Vitamin (MULTI-VITAMIN) tablet, Take 1 tablet by mouth daily., Disp: , Rfl:  .  nitrofurantoin, macrocrystal-monohydrate, (MACROBID) 100 MG capsule, Take 1 capsule  (100 mg total) by mouth 2 (two) times daily., Disp: 10 capsule, Rfl: 0 .  nystatin (MYCOSTATIN/NYSTOP) powder, Apply topically 2 (two) times daily., Disp: 45 g, Rfl: 0 .  penicillin v potassium (VEETID) 500 MG tablet, Take 500 mg by mouth every 6 (six) hours., Disp: , Rfl:  .  polyethylene glycol powder (GLYCOLAX/MIRALAX) powder, Take 17 g by mouth daily as needed (constipation)., Disp: 527 g, Rfl: 3 .  senna (SENOKOT) 8.6 MG tablet, Take one or two tablets by mouth daily as needed for constipation, Disp: , Rfl:  .  sodium fluoride (PREVIDENT) 1.1 % GEL dental gel, USE AS DIRECTED., Disp: , Rfl:  .  Spacer/Aero-Holding Chambers (E-Z SPACER) inhaler, Use as instructed, Disp: 1 each, Rfl: 2 .  UNIFINE PENTIPS 31G X 6 MM MISC, USE AS PRESCRIBED BY PHYSICIAN, Disp: 100 each, Rfl: 11 No Known Allergies Social History   Tobacco Use  Smoking Status Never Smoker  Smokeless Tobacco Never Used   Objective:  There were no vitals filed for this visit. Constitutional Patient is a pleasant 60 y.o. African American  female, obese in NAD.Marland Kitchen  Vascular Capillary fill time to digits <3 seconds b/l lower extremities. Faintly palpable PT pulse(s) b/l lower extremities. Nonpalpable DP pulse(s) b/l lower extremities. Pedal hair absent. Lower extremity skin temperature gradient within normal limits. No pain with calf compression b/l. Nonpitting edema noted right lower extremity. No cyanosis or clubbing noted.  Neurologic Normal speech. Oriented to person, place, and time. Protective sensation diminished with 10g monofilament b/l. Vibratory sensation diminished b/l.  Dermatologic Pedal skin with normal turgor, texture and tone bilaterally. No open wounds bilaterally. No interdigital macerations bilaterally. Toenails 1-5 b/l elongated, discolored, dystrophic, thickened, crumbly with subungual debris and tenderness to dorsal palpation. Incurvated nailplate medial border(s) L hallux.  Nail border hypertrophy minimal. There  is tenderness to palpation. Sign(s) of infection: no clinical signs of infection noted on examination today..She has some residual flaky skin on the lateral aspect of her ankle.  Orthopedic: Muscle strength 5/5 to all LE muscle groups of left lower extremity. Flaccid lower extremities RLE. No pain crepitus or joint limitation noted with ROM b/l. No gross bony deformities bilaterally.    Assessment:   1. Pain due to onychomycosis of toenails of both feet   2. Diabetic peripheral neuropathy associated with type 2 diabetes mellitus ([place])    Plan:  Patient was evaluated and treated and all questions answered.  Onychomycosis with pain -Nails palliatively debridement as below. -Educated on self-care  Procedure: Nail Debridement Rationale: Pain Type of Debridement: manual, sharp debridement. Instrumentation: Nail nipper, rotary burr. Number of Nails: 10  -Examined patient. -Continue diabetic foot care principles. -Toenails 1-5 b/l were debrided in length and girth with sterile nail nippers and dremel without iatrogenic bleeding.  -Daughter to continue. -For dry flakes, daughter to continue to wash right foot with Dove soap and baby brush, followed by moisturizer. -Patient to report any pedal injuries to medical professional immediately. -Patient to continue soft, supportive shoe gear daily. -Patient/POA to call should there be question/concern in the interim.  Return in about 3 months (around 07/15/2020).  Marzetta Board, DPM

## 2020-06-07 ENCOUNTER — Other Ambulatory Visit: Payer: Self-pay | Admitting: Family Medicine

## 2020-06-07 DIAGNOSIS — M6281 Muscle weakness (generalized): Secondary | ICD-10-CM

## 2020-06-07 DIAGNOSIS — Z8673 Personal history of transient ischemic attack (TIA), and cerebral infarction without residual deficits: Secondary | ICD-10-CM

## 2020-06-08 ENCOUNTER — Other Ambulatory Visit: Payer: Self-pay

## 2020-06-08 ENCOUNTER — Ambulatory Visit
Admission: RE | Admit: 2020-06-08 | Discharge: 2020-06-08 | Disposition: A | Discharge status: Home / Self Care | Payer: Medicaid Other | Source: Ambulatory Visit | Attending: Family Medicine | Admitting: Family Medicine

## 2020-06-08 DIAGNOSIS — Z8673 Personal history of transient ischemic attack (TIA), and cerebral infarction without residual deficits: Secondary | ICD-10-CM

## 2020-06-08 DIAGNOSIS — M6281 Muscle weakness (generalized): Secondary | ICD-10-CM

## 2020-07-26 ENCOUNTER — Other Ambulatory Visit: Payer: Self-pay

## 2020-07-26 ENCOUNTER — Encounter: Payer: Self-pay | Admitting: Podiatry

## 2020-07-26 ENCOUNTER — Ambulatory Visit (INDEPENDENT_AMBULATORY_CARE_PROVIDER_SITE_OTHER): Payer: Medicaid Other | Admitting: Podiatry

## 2020-07-26 DIAGNOSIS — E1142 Type 2 diabetes mellitus with diabetic polyneuropathy: Secondary | ICD-10-CM

## 2020-07-26 DIAGNOSIS — B351 Tinea unguium: Secondary | ICD-10-CM | POA: Diagnosis not present

## 2020-07-26 DIAGNOSIS — M79675 Pain in left toe(s): Secondary | ICD-10-CM | POA: Diagnosis not present

## 2020-07-26 DIAGNOSIS — M79674 Pain in right toe(s): Secondary | ICD-10-CM

## 2020-07-26 DIAGNOSIS — R600 Localized edema: Secondary | ICD-10-CM

## 2020-08-01 NOTE — Progress Notes (Addendum)
  Subjective:  Patient ID: [name], female    DOB: December 29, 1960,  MRN: [id number]  [name] presents to clinic today for at risk foot care with history of diabetic neuropathy and painful thick toenails that are difficult to trim. Pain interferes with ambulation. Aggravating factors include wearing enclosed shoe gear. Pain is relieved with periodic professional debridement.   Her daughter is present during today's visit. Daughter states she continues to wash right foot with Dove soap and baby brush and apply moisturizer to foot daily for dry flakes.  Patient's blood glucose was 131 mg/dl this morning.   PCP is Dr. Precious Haws and last visit was 06/07/2020.  No Known Allergies  Review of Systems: Negative except as noted in the HPI. Objective:   Constitutional [name] is a pleasant 60 y.o. African American female, obese in NAD. AAO x 3.   Vascular Capillary fill time to digits <3 seconds b/l lower extremities. Faintly palpable PT pulse(s) b/l lower extremities. Nonpalpable DP pulse(s) b/l lower extremities. Pedal hair absent. Lower extremity skin temperature gradient within normal limits. Nonpitting edema noted right lower extremity. No cyanosis or clubbing noted.  Neurologic Aphasic. Oriented to person, place, and time. Protective sensation diminished with 10g monofilament b/l. Vibratory sensation diminished b/l.  Dermatologic Pedal skin with normal turgor, texture and tone bilaterally. No open wounds bilaterally. No interdigital macerations bilaterally. Toenails 1-5 b/l elongated, discolored, dystrophic, thickened, crumbly with subungual debris and tenderness to dorsal palpation. Diffuse scaling noted peripherally and plantarly b/l feet with mild foot odor.  No interdigital macerations.  No blisters, no weeping. No signs of secondary bacterial infection noted. Improvement of right foot dry flaky plaques with current treatment plan.  Orthopedic: Muscle strength 5/5 to all LE  muscle groups of left lower extremity. Flaccid lower extremitiy RLE. No pain crepitus or joint limitation noted with ROM b/l LE.   Radiographs: None Assessment:   1. Pain due to onychomycosis of toenails of both feet   2. Unilateral edema of lower extremity   3. Diabetic peripheral neuropathy associated with type 2 diabetes mellitus ([place])    Plan:  Patient was evaluated and treated and all questions answered.  Onychomycosis with pain -Nails palliatively debridement as below -Educated on self-care  Procedure: Nail Debridement Rationale: Pain Type of Debridement: manual, sharp debridement. Instrumentation: Nail nipper, rotary burr. Number of Nails: 10 -Examined patient. -No new findings. No new orders. -Patient to continue soft, supportive shoe gear daily. -Toenails 1-5 b/l were debrided in length and girth with sterile nail nippers and dremel without iatrogenic bleeding.  -Patient to report any pedal injuries to medical professional immediately. -Continue current treatment plan of Dove soap, baby brush and moisturizer for dry flakes on RLE.. -Patient/POA to call should there be question/concern in the interim.  Return in about 3 months (around 10/26/2020).  Marzetta Board, DPM

## 2020-08-18 ENCOUNTER — Institutional Professional Consult (permissible substitution): Payer: Medicaid Other | Admitting: Neurology

## 2020-09-14 ENCOUNTER — Encounter: Payer: Self-pay | Admitting: Neurology

## 2020-09-14 ENCOUNTER — Ambulatory Visit: Payer: Medicaid Other | Admitting: Neurology

## 2020-09-14 ENCOUNTER — Other Ambulatory Visit: Payer: Self-pay

## 2020-09-14 VITALS — BP 161/96 | HR 88 | Ht 65.0 in | Wt 163.0 lb

## 2020-09-14 DIAGNOSIS — I69359 Hemiplegia and hemiparesis following cerebral infarction affecting unspecified side: Secondary | ICD-10-CM | POA: Diagnosis not present

## 2020-09-14 DIAGNOSIS — I69320 Aphasia following cerebral infarction: Secondary | ICD-10-CM

## 2020-09-14 DIAGNOSIS — R531 Weakness: Secondary | ICD-10-CM

## 2020-09-14 NOTE — Patient Instructions (Signed)
I had a long discussion with the patient and her daughter regarding her increased weakness and difficulty with transfers and recommend further evaluation by checking a CT angiogram of the brain and neck and recommend home physical and occupational therapy for muscle strengthening and improvement in transfers.  Continue aspirin for stroke prevention and maintain aggressive risk factor modification strict control of hypertension with blood pressure goal below 130/90, lipids with LDL cholesterol goal below 70 mg percent and diabetes with hemoglobin A1c goal below 6.5%.  She will return for follow-up in the future in 3 months or call earlier if necessary.

## 2020-09-14 NOTE — Progress Notes (Signed)
[facility] [address] [place] [address] [phone]       OFFICE FOLLOW UP VISIT NOTE  Ms. [name] Date of Birth:  09-08-1960 Medical Record Number:  [id number]   Referring MD: Precious Haws Reason for Referral: Stroke follow-up  HPI: Initial consult 2/26//2020:[name] is a 60 year old African-American lady seen today for consultation.  She is accompanied by her daughter.  History is provided by her, review of electronic medical records and have personally reviewed imaging films in PACS.  She is a 60 year old African-American lady with past medical history of hypertension, diabetes, bipolar disorder who was initially admitted on 05/27/2013 with aphasia and right facial droop.  She was found to have a large left middle cerebral artery infarct on the MRI scan with left middle cerebral artery occlusion in the M1 segment without any collateralization.  Infarcts were felt to be embolic due to unidentified source.  She was placed on aspirin for secondary stroke prevention but had progressive neurological worsening on 05/29/2013.  Symptoms were felt to be due to relative hypotension.  She did not improve despite permissive hypertension.  Extensive work-up including hypercoagulable panel and echocardiogram were negative.  EEG was negative for seizures.  She subsequently had a feeding tube and was transferred to rehab.  She was followed by Dr. Charleen Kirks and got some Botox injections which she did not tolerate well.  She has been living at home with her daughter who provides care.  She does have a CNA during the daytime when the daughter is out.  The patient was followed in office and was last seen by Dr. Erlinda Hong on 07/17/2016.  She apparently has not had any definite stroke or TIA symptoms since her original stroke.  She is had some intermittent transient episodes of confusion when she gets upset.  The daughter has not noticed any new focal neurological findings.  The patient  has been having some intermittent vaginal bleeding.  Her gynecologist plans to do exam under general anesthesia and patient is referred to me for neurological clearance.  Review of electronic records show that she has not had any brain imaging study or follow-up carotid Dopplers were brain vascular studies over the last several years.  I do not also find any recent lab work for cholesterol or diabetes control. Virtual video visit 08/05/2018:Ms. Ng is seen today following recent visit to the emergency room 10 days ago for possible stroke versus seizure.  I have personally reviewed ER visit and imaging films in PACS.  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  She started to recover fairly quickly but initially refused to go to the hospital in an ambulance.  The family subsequently drove her to the hospital and she Refused refusing to get out from a wheelchair.  She complained of mild headache and increased weakness on the right side from her baseline.  Code stroke was activated.  She was seen by Dr. Rory Percy neuro hospitalist who felt she may have had a seizure and appeared to be postictal with transient worsening of her deficits.  CT scan of the head was obtained which showed chronic large left MCA infarct without any bleed or acute changes.  CT angiogram showed severe left M1 stenosis with attenuated  flow pass the left M1 and 60% stenosis of carotid siphons bilaterally.  This is unchanged from prior studies from 05/20/2013.  Her NIH stroke scale was found to be 11 and modified Rankin at baseline 4. Patient was loaded with Keppra and started on Keppra 500 twice daily for seizures.  EEG was not obtained but a previous  EEG from 07/14/2014 had showed focal left hemispheric slowing with left frontotemporal irritability.  Patient has still not recovered back to her prior baseline as per the daughter.  She is still weaker on the right side.  She is still slow to respond.  She is able to communicate her needs but does not speak much.  She still requires 24-hour care at home Update 07/07/2019 : Patient is seen today for follow-up after last visit almost a year ago.  She is accompanied by her daughter.  Gotten noticed that the patient started having some behavioral issues ever since Keppra was started more than a year ago.  She has been more irritable.  She is often pushing people.  She has some paranoid ideation and occasional delusions as well as hallucinations.  Patient's daughter had called [place] and I recommended she decrease the Keppra to 250 mg a day with these behaviors have not completely gone away.  Patient was started on Keppra by me for an episode of altered awareness followed by some confusion for a few hours it was felt to be seizure her EEG done at that time had shown some slowing in the left hemisphere but no definite epileptiform activity.  I had suggested the patient daughter switch Keppra to Depakote but she does not want to try new medication at this time.  Patient continues to have significant aphasia with her understanding seems a lot better.  She still requires constant supervision and lives with her daughter.  She does have a CNA during the day to help and daughter is working.  She is able to walk with a hemiwalker she has had no falls.  She still has significant spasticity at times she even gets clonus in the right leg.  Patient has an upcoming visit in June with primary care physician and will have follow-up lipid profile and hemoglobin A1c checked.  She had D&C done by her gynecologist in December 2020.  There have been no recurrent stroke or TIA symptoms.  There have been no seizure-like episodes either.  She  plans on having a root canal and needs neurological clearance for the procedure.   Update 09/14/2020 ; patient is referred back to the practice to be evaluated by primary care physician has since March 2022 patient has some increased weakness and leaning to the left side and she has not been able to transfer as well from wheelchair to out of it as she used to.  She had some lab work done in March which included CBC and complete metabolic panel labs which were unremarkable.  MRI scan of the brain was ordered but patient was unable to transfer from stretcher to the MRI to completed hence it was not done.  She had hemoglobin A1c checked on 08/26/2020 which was 7.0.  Lipid profile was apparently satisfactory but I did not see the reports.  Patient was last seen by me in the office on 07/07/2019 at that time carotid ultrasound was ordered on 07/18/2019 which showed no significant extracranial stenosis.  She continues to have significant expressive aphasia though she can understand much better.  She has spastic and hemiplegia.  She  is mostly wheelchair-bound and not working. ROS:   14 system review of systems is positive for weakness,decrease transfer ability,, irritability, agitation, behavioral change and swelling, confusion, speech difficulty, gait difficulty and all other systems negative  PMH:  Past Medical History:  Diagnosis Date   Aphasia due to stroke    Bipolar affective disorder ([place])    Depression    Diabetes mellitus ([place])    ?type (05/29/2013)   Episodic confusion    Hemiplegia affecting dominant side, post-stroke ([place])    right side   Hypertension    Schizophrenia ([place])    [name] 11/28/2007 (05/29/2013)   Seizures ([place])    Stroke ([place]) 05/27/2013    Social History:  Social History   Socioeconomic History   Marital status: Single    Spouse name: Not on file   Number of children: 3   Years of education: 12   Highest education level: Not on file  Occupational History   Occupation:  disabled  Tobacco Use   Smoking status: Never   Smokeless tobacco: Never  Substance and Sexual Activity   Alcohol use: No    Alcohol/week: 0.0 standard drinks   Drug use: No   Sexual activity: Not on file  Other Topics Concern   Not on file  Social History Narrative   Lives with daughter [name]   Patient is right handed.   Patient has hs education.   Drinks 2 cups caffeine daily   Social Determinants of Health   Financial Resource Strain: Not on file  Food Insecurity: Not on file  Transportation Needs: Not on file  Physical Activity: Not on file  Stress: Not on file  Social Connections: Not on file  Intimate Partner Violence: Not on file    Medications:   Current Outpatient Medications on File Prior to Visit  Medication Sig Dispense Refill   Accu-Chek Softclix Lancets lancets 1 Stick by [employer].(Non-Drug; Combo Route) route 3 times daily.     acetaminophen (TYLENOL) 325 MG tablet Take 1-2 tablets (325-650 mg total) by mouth every 4 (four) hours as needed for mild pain (available over the counter).     amLODipine (NORVASC) 10 MG tablet Take 1 tablet by mouth daily.     amoxicillin (AMOXIL) 500 MG capsule Take 500 mg by mouth every 8 (eight) hours.     aspirin EC 81 MG tablet Take by mouth.     atorvastatin (LIPITOR) 20 MG tablet Take 1 tablet by mouth daily.     baclofen (LIORESAL) 10 MG tablet TAKE ONE (1) TABLET BY MOUTH TWO (2) TIMES DAILY TO HELP WITH TONE/SPASMS 60 each 2   benzonatate (TESSALON) 100 MG capsule Take 1 capsule (100 mg total) by mouth every 8 (eight) hours. 21 capsule 0   betamethasone valerate ointment (VALISONE) 0.1 % APPLY TOPICALLY 2 TIMES DAILY AS NEEDED.     cetirizine (ZYRTEC) 10 MG tablet TAKE ONE (1) TABLET BY MOUTH EVERY DAY 30 tablet 3   diazepam (VALIUM) 5 MG tablet SMARTSIG:1-2 Tablet(s) By Mouth Once     doxycycline (VIBRAMYCIN) 100 MG capsule Take 100 mg by mouth 2 (two) times daily.     gabapentin (NEURONTIN) 100 MG capsule Take 1 capsule by  mouth daily.     glucose blood (PRECISION QID TEST) test strip 1 strip by Misc.(Non-Drug; Combo Route) route 3 times daily.     hydrochlorothiazide (HYDRODIURIL) 25 MG tablet Take 1 tablet by mouth daily.     ibuprofen (ADVIL) 600 MG tablet Take 600 mg  by mouth every 8 (eight) hours as needed.     ibuprofen (ADVIL,MOTRIN) 400 MG tablet Take 1 tablet (400 mg total) by mouth every 6 (six) hours as needed. 30 tablet 0   insulin aspart (NOVOLOG) 100 UNIT/ML injection 0-12 units as per sliding scale (Patient taking differently: Inject 0-12 Units into the skin 3 (three) times daily with meals. per sliding scale) 10 mL 3   insulin glargine (LANTUS SOLOSTAR) 100 UNIT/ML Solostar Pen Inject into the skin.     Insulin Pen Needle (B-D UF III MINI PEN NEEDLES) 31G X 5 MM MISC Use with insulin pen daily. E11.40, Z79.4     levETIRAcetam (KEPPRA) 500 MG tablet TAKE 1 TABLET BY MOUTH TWICE A DAY (Patient taking differently: 250 mg 2 (two) times daily.) 60 tablet 3   lubiprostone (AMITIZA) 8 MCG capsule Take 1 capsule (8 mcg total) by mouth 2 (two) times daily with a meal. 60 capsule 11   medroxyPROGESTERone (PROVERA) 10 MG tablet SMARTSIG:1 Tablet(s) By Mouth Morning-Night     metoprolol (LOPRESSOR) 100 MG tablet Take 1.5 tablets (150 mg total) by mouth 2 (two) times daily. 90 tablet 5   metoprolol tartrate (LOPRESSOR) 50 MG tablet Take 1 tablet by mouth 2 (two) times daily.     Multiple Vitamin (MULTI-VITAMIN) tablet Take 1 tablet by mouth daily.     nitrofurantoin, macrocrystal-monohydrate, (MACROBID) 100 MG capsule Take 1 capsule (100 mg total) by mouth 2 (two) times daily. 10 capsule 0   nystatin (MYCOSTATIN/NYSTOP) powder Apply topically 2 (two) times daily. 45 g 0   penicillin v potassium (VEETID) 500 MG tablet Take 500 mg by mouth every 6 (six) hours.     polyethylene glycol powder (GLYCOLAX/MIRALAX) powder Take 17 g by mouth daily as needed (constipation). 527 g 3   senna (SENOKOT) 8.6 MG tablet Take one  or two tablets by mouth daily as needed for constipation     sodium fluoride (FLUORISHIELD) 1.1 % GEL dental gel USE AS DIRECTED.     Spacer/Aero-Holding Chambers (E-Z SPACER) inhaler Use as instructed 1 each 2   No current facility-administered medications on file prior to visit.    Allergies:  No Known Allergies  Physical Exam General: well developed, well nourished middle-aged African-American lady, seated, in no evident distress Head: head normocephalic and atraumatic.   Neck: supple with no carotid or supraclavicular bruits Cardiovascular: regular rate and rhythm, no murmurs Musculoskeletal: no deformity.  She has right foot drop and AFO Skin:  no rash/petichiae Vascular:  Normal pulses all extremities  Neurologic Exam Mental Status: Awake and fully alert.  Severe expressive greater than receptive aphasia and speaks only occasional 1 or 2 words but difficult to comprehend.  She can understand simple midline commands and one-step commands only.  Unable to name and repeat.   Cranial Nerves: Fundoscopic exam not done Pupils equal, briskly reactive to light. Extraocular movements full without nystagmus. Visual fields showed diminished blink to threat on the right . Hearing intact. Facial sensation intact.  Right lower facial weakness., tongue, palate moves normally and symmetrically.  Motor: Spastic right hemiplegia with right upper extremity proximal strength 2/5 and distal 0/5 with fixed flexion contracture of the wrist and fingers.  Right lower extremity is 4/5 proximally and 3/5 distally with right ankle foot drop and wears an AFO.  Tone is increased on the right side with spasticity.left upper extremity 4+/5 and LLE 4/5 strength Sensory.: intact to touch , pinprick , position and vibratory sensation.  Coordination: Rapid  alternating movements normal in all extremities. Finger-to-nose and heel-to-shin performed accurately bilaterally. Gait and Station: Deferred as patient did not bring  her hemiwalker.   Reflexes: 2+ and ssymmetric and brisker on the right. toes downgoing.   NIHSS  13 Modified Rankin  4   ASSESSMENT: 78 lady with left MCA infarct in March 2015 with significant residual aphasia and right hemiplegia vascular risk factors of hypertension, diabetes and cerebrovascular disease.   Recent increased weakness and decrease abliity to transfer of unclear etiology     PLAN: I had a long discussion with the patient and her daughter regarding her increased weakness and difficulty with transfers and recommend further evaluation by checking a CT angiogram of the brain and neck  as patient was unable to cooperate for doing an MRI and recommend home physical and occupational therapy for muscle strengthening and improvement in transfers.  Continue aspirin for stroke prevention and maintain aggressive risk factor modification strict control of hypertension with blood pressure goal below 130/90, lipids with LDL cholesterol goal below 70 mg percent and diabetes with hemoglobin A1c goal below 6.5%.  She will return for follow-up in the future in 3 months or call earlier if necessary.Greater than 50% time during this 25-minute consultation visit was spent on counseling and coordination of care about her remote stroke , weakness, and residual aphasia and hemiplegia and answering questions  Antony Contras, MD  [facility] [address] [place], [place] [address]  Phone [phone] Fax [phone] Note: This document was prepared with digital dictation and possible smart phrase technology. Any transcriptional errors that result from this process are unintentional.

## 2020-09-21 ENCOUNTER — Telehealth: Payer: Self-pay

## 2020-09-21 NOTE — Telephone Encounter (Signed)
Colletta Maryland from [facility] called with the orders for pt's in home PT. 1W1, 2W2, 1W3. If you have any questions please give stephanie at [phone].

## 2020-09-21 NOTE — Telephone Encounter (Signed)
Noted  

## 2020-09-21 NOTE — Telephone Encounter (Signed)
Referral for Home PT/OT sent to [facility]. P: [phone].

## 2020-09-28 NOTE — Telephone Encounter (Signed)
[facility] called, verbal order for OT. Frequency: 1x for 8 wks; starting  this week.

## 2020-09-28 NOTE — Telephone Encounter (Signed)
Returned the call to Victoria at [phone]. He will initiate orders as outlined below to address the patient's needs.

## 2020-10-05 ENCOUNTER — Telehealth: Payer: Self-pay | Admitting: *Deleted

## 2020-10-05 NOTE — Telephone Encounter (Signed)
Received an order request from PT at [facility] [id number]). They have identified that patient needs a manuel wheelchair to assist with basic mobility and ADLs. Dr. Krista Blue has signed the order while Dr. Leonie Man is out of the office. It has been faxed and confirmed back to [phone].

## 2020-10-13 ENCOUNTER — Telehealth: Payer: Self-pay | Admitting: Neurology

## 2020-10-13 NOTE — Telephone Encounter (Signed)
Colletta Maryland from [facility] called needing VO again for the pt's PT. She states that the Eval was done on July 1st but because of the pt's insurance they were not going to be able to see her till Aug 1st but that will be putting them out of the original window that was given. Colletta Maryland is needing new orders for 2 times one and 1 time 1 Please advise.

## 2020-10-13 NOTE — Telephone Encounter (Signed)
I called [name] back and provided the verbal order needed for the Physical Therapy. She verbalized understanding/appreciation for the call.

## 2020-10-18 NOTE — Telephone Encounter (Signed)
Received POC/orders for Dr. Clydene Fake signature.  Placed in his office for signature upon his next day in.

## 2020-10-20 ENCOUNTER — Telehealth: Payer: Self-pay | Admitting: Neurology

## 2020-10-20 NOTE — Telephone Encounter (Signed)
[name] from [facility] called with a VO for more PT for patient. Starting next week with 2 times a week for 3 weeks followed by 1w1. Please advise.

## 2020-10-20 NOTE — Telephone Encounter (Signed)
Attempted to call Colletta Maryland, phone immediately rang busy. Will call back tomorrow.

## 2020-10-21 NOTE — Telephone Encounter (Signed)
I called [facility] directly and was advised the number recorded was not the correct number for [facility].  Correct number is [phone], I called Stephanie and provided VO for physical therapy.  She verbalized understanding and appreciation for the call.

## 2020-10-21 NOTE — Telephone Encounter (Signed)
I attempted to call Colletta Maryland. Received a busy number response. Unable to leave a vm.

## 2020-10-25 ENCOUNTER — Ambulatory Visit (INDEPENDENT_AMBULATORY_CARE_PROVIDER_SITE_OTHER): Payer: Medicaid Other | Admitting: Podiatry

## 2020-10-25 ENCOUNTER — Other Ambulatory Visit: Payer: Self-pay

## 2020-10-25 ENCOUNTER — Encounter: Payer: Self-pay | Admitting: Podiatry

## 2020-10-25 DIAGNOSIS — M79675 Pain in left toe(s): Secondary | ICD-10-CM | POA: Diagnosis not present

## 2020-10-25 DIAGNOSIS — M79674 Pain in right toe(s): Secondary | ICD-10-CM | POA: Diagnosis not present

## 2020-10-25 DIAGNOSIS — B351 Tinea unguium: Secondary | ICD-10-CM

## 2020-10-25 DIAGNOSIS — E1142 Type 2 diabetes mellitus with diabetic polyneuropathy: Secondary | ICD-10-CM

## 2020-10-29 NOTE — Progress Notes (Signed)
  Subjective:  Patient ID: [name], female    DOB: 1960-08-24,  MRN: [id number]  [name] presents to clinic today for at risk foot care with history of diabetic neuropathy and painful thick toenails that are difficult to trim. Pain interferes with ambulation. Aggravating factors include wearing enclosed shoe gear. Pain is relieved with periodic professional debridement.  Her daughter is present during today's visit. They voice no new pedal problems on today's visit.  PCP is Bartholome Bill, MD , and last visit was 08/26/2020.  No Known Allergies  Review of Systems: Negative except as noted in the HPI. Objective:   Constitutional [name] is a pleasant 61 y.o. African American female, in NAD. AAO x 3.   Vascular Capillary fill time to digits <3 seconds b/l lower extremities. Palpable PT pulse(s) b/l lower extremities Faintly palpable DP pulse(s) b/l lower extremities. Pedal hair absent. Lower extremity skin temperature gradient within normal limits. Nonpitting edema noted right lower extremity. No cyanosis or clubbing noted.  Neurologic Normal speech. Oriented to person, place, and time. Protective sensation diminished with 10g monofilament b/l.  Dermatologic Pedal skin with normal turgor, texture and tone b/l lower extremities. Toenails 1-5 b/l elongated, discolored, dystrophic, thickened, crumbly with subungual debris and tenderness to dorsal palpation.  Orthopedic: Muscle strength 5/5 to all LE muscle groups of left lower extremity. Flaccid lower extremity noted right lower extremity.   Radiographs: None Assessment:   1. Pain due to onychomycosis of toenails of both feet   2. Diabetic peripheral neuropathy associated with type 2 diabetes mellitus ([place])    Plan:  -Examined patient. -Patient to continue soft, supportive shoe gear daily. -Toenails 1-5 b/l were debrided in length and girth with sterile nail nippers and dremel without iatrogenic bleeding.  Daughter was instructed to apply Neosporin to left great toe once daily for one week. -Patient to report any pedal injuries to medical professional immediately. -Patient/POA to call should there be question/concern in the interim.  Return in about 3 months (around 01/25/2021).  Marzetta Board, DPM

## 2020-11-01 ENCOUNTER — Other Ambulatory Visit: Payer: Self-pay | Admitting: Family Medicine

## 2020-11-01 DIAGNOSIS — Z1231 Encounter for screening mammogram for malignant neoplasm of breast: Secondary | ICD-10-CM

## 2020-11-10 ENCOUNTER — Telehealth: Payer: Self-pay | Admitting: Neurology

## 2020-11-10 NOTE — Telephone Encounter (Signed)
Left message confirming the frequency as outlined below to meet the patient's needs. Provided our number to call back with any questions.

## 2020-11-10 NOTE — Telephone Encounter (Signed)
LVM: [facility] ([place]ammy);calling for verbal order for OT. Frequency: 1 wk for 4 wk.  Would like a call from the nurse to confirm frequency.

## 2020-11-29 ENCOUNTER — Ambulatory Visit: Payer: Medicaid Other

## 2020-12-30 ENCOUNTER — Other Ambulatory Visit: Payer: Self-pay

## 2020-12-30 ENCOUNTER — Ambulatory Visit
Admission: RE | Admit: 2020-12-30 | Discharge: 2020-12-30 | Disposition: A | Discharge status: Home / Self Care | Payer: Medicaid Other | Source: Ambulatory Visit | Attending: Family Medicine | Admitting: Family Medicine

## 2020-12-30 DIAGNOSIS — Z1231 Encounter for screening mammogram for malignant neoplasm of breast: Secondary | ICD-10-CM

## 2021-01-06 ENCOUNTER — Encounter: Payer: Self-pay | Admitting: Neurology

## 2021-01-06 ENCOUNTER — Ambulatory Visit: Payer: Medicaid Other | Admitting: Neurology

## 2021-01-06 VITALS — BP 141/85 | HR 84

## 2021-01-06 DIAGNOSIS — I69359 Hemiplegia and hemiparesis following cerebral infarction affecting unspecified side: Secondary | ICD-10-CM

## 2021-01-06 DIAGNOSIS — R5381 Other malaise: Secondary | ICD-10-CM

## 2021-01-06 DIAGNOSIS — I69320 Aphasia following cerebral infarction: Secondary | ICD-10-CM | POA: Diagnosis not present

## 2021-01-06 NOTE — Patient Instructions (Signed)
I had a long discussion with the patient and her daughter regarding her recent generalized weakness which appears to have improved after home physical and occupational therapy which likely related to deconditioning.  Patient has not been cooperative to undergo diagnostic imaging which I think we can hold off as she has shown improvement.  Continue home Physical and Occupational Therapy and aspirin 81 mg daily for stroke prevention with aggressive risk factor modification with strict control of hypertension with blood pressure goal below 130/90, lipids with LDL cholesterol goal below 70 mg percent and diabetes with hemoglobin A1c goal below 6.5%.  She was encouraged to learn to walk with a walker with 1 person assist.  She will return for follow-up in the future only as needed and no routine schedule appointment was made.

## 2021-01-06 NOTE — Progress Notes (Signed)
[facility] [address] [place] [address] [phone]       OFFICE FOLLOW UP VISIT NOTE  [name] Date of Birth:  09-08-1960 Medical Record Number:  [id number]   Referring MD: Precious Haws Reason for Referral: Stroke follow-up  HPI: Initial consult 2/26//2020:[name] is a 60 year old African-American lady seen today for consultation.  She is accompanied by her daughter.  History is provided by her, review of electronic medical records and have personally reviewed imaging films in PACS.  She is a 60 year old African-American lady with past medical history of hypertension, diabetes, bipolar disorder who was initially admitted on 05/27/2013 with aphasia and right facial droop.  She was found to have a large left middle cerebral artery infarct on the MRI scan with left middle cerebral artery occlusion in the M1 segment without any collateralization.  Infarcts were felt to be embolic due to unidentified source.  She was placed on aspirin for secondary stroke prevention but had progressive neurological worsening on 05/29/2013.  Symptoms were felt to be due to relative hypotension.  She did not improve despite permissive hypertension.  Extensive work-up including hypercoagulable panel and echocardiogram were negative.  EEG was negative for seizures.  She subsequently had a feeding tube and was transferred to rehab.  She was followed by Dr. Charleen Kirks and got some Botox injections which she did not tolerate well.  She has been living at home with her daughter who provides care.  She does have a CNA during the daytime when the daughter is out.  The patient was followed in office and was last seen by Dr. Erlinda Hong on 07/17/2016.  She apparently has not had any definite stroke or TIA symptoms since her original stroke.  She is had some intermittent transient episodes of confusion when she gets upset.  The daughter has not noticed any new focal neurological findings.  The patient  has been having some intermittent vaginal bleeding.  Her gynecologist plans to do exam under general anesthesia and patient is referred to me for neurological clearance.  Review of electronic records show that she has not had any brain imaging study or follow-up carotid Dopplers were brain vascular studies over the last several years.  I do not also find any recent lab work for cholesterol or diabetes control. Virtual video visit 08/05/2018:[name] is seen today following recent visit to the emergency room 10 days ago for possible stroke versus seizure.  I have personally reviewed ER visit and imaging films in PACS.  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  She started to recover fairly quickly but initially refused to go to the hospital in an ambulance.  The family subsequently drove her to the hospital and she Refused refusing to get out from a wheelchair.  She complained of mild headache and increased weakness on the right side from her baseline.  Code stroke was activated.  She was seen by Dr. Rory Percy neuro hospitalist who felt she may have had a seizure and appeared to be postictal with transient worsening of her deficits.  CT scan of the head was obtained which showed chronic large left MCA infarct without any bleed or acute changes.  CT angiogram showed severe left M1 stenosis with attenuated  flow pass the left M1 and 60% stenosis of carotid siphons bilaterally.  This is unchanged from prior studies from 05/20/2013.  Her NIH stroke scale was found to be 11 and modified Rankin at baseline 4. Patient was loaded with Keppra and started on Keppra 500 twice daily for seizures.  EEG was not obtained but a previous  EEG from 07/14/2014 had showed focal left hemispheric slowing with left frontotemporal irritability.  Patient has still not recovered back to her prior baseline as per the daughter.  She is still weaker on the right side.  She is still slow to respond.  She is able to communicate her needs but does not speak much.  She still requires 24-hour care at home Update 07/07/2019 : Patient is seen today for follow-up after last visit almost a year ago.  She is accompanied by her daughter.  Gotten noticed that the patient started having some behavioral issues ever since Keppra was started more than a year ago.  She has been more irritable.  She is often pushing people.  She has some paranoid ideation and occasional delusions as well as hallucinations.  Patient's daughter had called [place] and I recommended she decrease the Keppra to 250 mg a day with these behaviors have not completely gone away.  Patient was started on Keppra by me for an episode of altered awareness followed by some confusion for a few hours it was felt to be seizure her EEG done at that time had shown some slowing in the left hemisphere but no definite epileptiform activity.  I had suggested the patient daughter switch Keppra to Depakote but she does not want to try new medication at this time.  Patient continues to have significant aphasia with her understanding seems a lot better.  She still requires constant supervision and lives with her daughter.  She does have a CNA during the day to help and daughter is working.  She is able to walk with a hemiwalker she has had no falls.  She still has significant spasticity at times she even gets clonus in the right leg.  Patient has an upcoming visit in June with primary care physician and will have follow-up lipid profile and hemoglobin A1c checked.  She had D&C done by her gynecologist in December 2020.  There have been no recurrent stroke or TIA symptoms.  There have been no seizure-like episodes either.  She  plans on having a root canal and needs neurological clearance for the procedure.   Update 09/14/2020 ; patient is referred back to the practice to be evaluated by primary care physician has since March 2022 patient has some increased weakness and leaning to the left side and she has not been able to transfer as well from wheelchair to out of it as she used to.  She had some lab work done in March which included CBC and complete metabolic panel labs which were unremarkable.  MRI scan of the brain was ordered but patient was unable to transfer from stretcher to the MRI to completed hence it was not done.  She had hemoglobin A1c checked on 08/26/2020 which was 7.0.  Lipid profile was apparently satisfactory but I did not see the reports.  Patient was last seen by me in the office on 07/07/2019 at that time carotid ultrasound was ordered on 07/18/2019 which showed no significant extracranial stenosis.  She continues to have significant expressive aphasia though she can understand much better.  She has spastic and hemiplegia.  She  is mostly wheelchair-bound and not working. Update 01/06/2021 ; she returns for follow-up after last visit of 3 and half months ago.  She is accompanied by her daughter.  She states that she feels patient is doing better and almost back to her baseline and has benefited with home physical and occupational therapy.  She is not able to help a little bit with transfers.  She can stand up a little bit with her therapist and walk.  She is mostly in the wheelchair.  She was advised to undergo CT angiogram of brain and neck by me but for unclear reason that has not been done.  She had some lab work on 12/28/2020 which showed A1c of 7.4.  Comprehensive metabolic panel was significant for borderline creatinine of 1.26 and reduced potassium 3.4 otherwise unremarkable.  Last carotid ultrasound was on 07/17/2020 which showed no significant extracranial stenosis.  She has no new complaints today. ROS:   14  system review of systems is positive for weakness,decrease transfer ability,, irritability, agitation, behavioral change and swelling, confusion, speech difficulty, gait difficulty and all other systems negative  PMH:  Past Medical History:  Diagnosis Date   Aphasia due to stroke    Bipolar affective disorder ([place])    Depression    Diabetes mellitus ([place])    ?type (05/29/2013)   Episodic confusion    Hemiplegia affecting dominant side, post-stroke ([place])    right side   Hypertension    Schizophrenia ([place])    [name] 11/28/2007 (05/29/2013)   Seizures ([place])    Stroke ([place]) 05/27/2013    Social History:  Social History   Socioeconomic History   Marital status: Single    Spouse name: Not on file   Number of children: 3   Years of education: 12   Highest education level: Not on file  Occupational History   Occupation: disabled  Tobacco Use   Smoking status: Never   Smokeless tobacco: Never  Substance and Sexual Activity   Alcohol use: No    Alcohol/week: 0.0 standard drinks   Drug use: No   Sexual activity: Not on file  Other Topics Concern   Not on file  Social History Narrative   Lives with daughter [name]   Patient is right handed.   Patient has hs education.   Drinks 2 cups caffeine daily   Social Determinants of Health   Financial Resource Strain: Not on file  Food Insecurity: Not on file  Transportation Needs: Not on file  Physical Activity: Not on file  Stress: Not on file  Social Connections: Not on file  Intimate Partner Violence: Not on file    Medications:   Current Outpatient Medications on File Prior to Visit  Medication Sig Dispense Refill   Accu-Chek Softclix Lancets lancets 1 Stick by [employer].(Non-Drug; Combo Route) route 3 times daily.     acetaminophen (TYLENOL) 325 MG tablet Take 1-2 tablets (325-650 mg total) by mouth every 4 (four) hours as needed for mild pain (available over the counter).     ACIDOPHILUS LACTOBACILLUS PO Take by mouth.      amLODipine (NORVASC) 10 MG tablet Take 1 tablet by mouth daily.     aspirin EC 81 MG tablet Take by mouth.     atorvastatin (LIPITOR) 20 MG tablet Take 1 tablet by mouth daily.     betamethasone valerate ointment (VALISONE) 0.1 % APPLY TOPICALLY 2 TIMES DAILY AS NEEDED.     gabapentin (NEURONTIN) 100 MG capsule Take 1 capsule by mouth  daily.     glucose blood (PRECISION QID TEST) test strip 1 strip by Misc.(Non-Drug; Combo Route) route 3 times daily.     hydrochlorothiazide (HYDRODIURIL) 25 MG tablet Take 1 tablet by mouth daily.     insulin aspart (NOVOLOG) 100 UNIT/ML injection 0-12 units as per sliding scale (Patient taking differently: Inject 0-12 Units into the skin 3 (three) times daily with meals. per sliding scale) 10 mL 3   insulin glargine (LANTUS SOLOSTAR) 100 UNIT/ML Solostar Pen Inject into the skin.     Insulin Pen Needle (B-D UF III MINI PEN NEEDLES) 31G X 5 MM MISC Use with insulin pen daily. E11.40, Z79.4     metoprolol tartrate (LOPRESSOR) 50 MG tablet Take by mouth.     Multiple Vitamin (MULTI-VITAMIN) tablet Take 1 tablet by mouth daily.     polyethylene glycol powder (GLYCOLAX/MIRALAX) powder Take 17 g by mouth daily as needed (constipation). 527 g 3   No current facility-administered medications on file prior to visit.    Allergies:  No Known Allergies  Physical Exam General: well developed, well nourished middle-aged African-American lady, seated, in no evident distress Head: head normocephalic and atraumatic.   Neck: supple with no carotid or supraclavicular bruits Cardiovascular: regular rate and rhythm, no murmurs Musculoskeletal: no deformity.  She has right foot drop and AFO Skin:  no rash/petichiae Vascular:  Normal pulses all extremities  Neurologic Exam Mental Status: Awake and fully alert.  Severe expressive greater than receptive aphasia and speaks only occasional 1 or 2 words but difficult to comprehend.  She can understand simple midline commands and  one-step commands only.  Unable to name and repeat.   Cranial Nerves: Fundoscopic exam not done Pupils equal, briskly reactive to light. Extraocular movements full without nystagmus. Visual fields showed diminished blink to threat on the right . Hearing intact. Facial sensation intact.  Right lower facial weakness., tongue, palate moves normally and symmetrically.  Motor: Spastic right hemiplegia with right upper extremity proximal strength 2/5 and distal 0/5 with fixed flexion contracture of the wrist and fingers.  Right lower extremity is 4/5 proximally and 3/5 distally with right ankle foot drop and wears an AFO.  Tone is increased on the right side with spasticity.left upper extremity 4+/5 and LLE 4/5 strength Sensory.: intact to touch , pinprick , position and vibratory sensation.  Coordination: Rapid alternating movements normal in all extremities. Finger-to-nose and heel-to-shin performed accurately bilaterally. Gait and Station: Deferred as patient did not bring her hemiwalker.   Reflexes: 2+ and ssymmetric and brisker on the right. toes downgoing.   NIHSS  13 Modified Rankin  4   ASSESSMENT: 8 lady with left MCA infarct in March 2015 with significant residual aphasia and right hemiplegia vascular risk factors of hypertension, diabetes and cerebrovascular disease.   Recent increased weakness and decrease abliity to transfer of unclear etiology which seems to have improved with physical and Occupational Therapy.     PLAN: I had a long discussion with the patient and her daughter regarding her recent generalized weakness which appears to have improved after home physical and occupational therapy which likely related to deconditioning.  Patient has not been cooperative to undergo diagnostic imaging which I think we can hold off as she has shown improvement.  Continue home Physical and Occupational Therapy and aspirin 81 mg daily for stroke prevention with aggressive risk factor modification  with strict control of hypertension with blood pressure goal below 130/90, lipids with LDL cholesterol goal below 70 mg  percent and diabetes with hemoglobin A1c goal below 6.5%.  She was encouraged to learn to walk with a walker with 1 person assist.  She will return for follow-up in the future only as needed and no routine schedule appointment was made.Greater than 50% time during this 25-minute consultation visit was spent on counseling and coordination of care about her remote stroke , weakness, and residual aphasia and hemiplegia and answering questions  Antony Contras, MD  [facility] [address] [place], [place] [address]  Phone [phone] Fax [phone] Note: This document was prepared with digital dictation and possible smart phrase technology. Any transcriptional errors that result from this process are unintentional.

## 2021-01-28 ENCOUNTER — Ambulatory Visit: Payer: Medicaid Other | Admitting: Podiatry

## 2021-02-16 ENCOUNTER — Ambulatory Visit (INDEPENDENT_AMBULATORY_CARE_PROVIDER_SITE_OTHER): Payer: Medicaid Other | Admitting: Podiatry

## 2021-02-16 ENCOUNTER — Other Ambulatory Visit: Payer: Self-pay

## 2021-02-16 ENCOUNTER — Encounter: Payer: Self-pay | Admitting: Podiatry

## 2021-02-16 DIAGNOSIS — R600 Localized edema: Secondary | ICD-10-CM | POA: Diagnosis not present

## 2021-02-16 DIAGNOSIS — E119 Type 2 diabetes mellitus without complications: Secondary | ICD-10-CM | POA: Diagnosis not present

## 2021-02-16 DIAGNOSIS — M79674 Pain in right toe(s): Secondary | ICD-10-CM | POA: Diagnosis not present

## 2021-02-16 DIAGNOSIS — M79675 Pain in left toe(s): Secondary | ICD-10-CM | POA: Diagnosis not present

## 2021-02-16 DIAGNOSIS — E1142 Type 2 diabetes mellitus with diabetic polyneuropathy: Secondary | ICD-10-CM | POA: Diagnosis not present

## 2021-02-16 DIAGNOSIS — B351 Tinea unguium: Secondary | ICD-10-CM | POA: Diagnosis not present

## 2021-02-18 NOTE — Progress Notes (Signed)
ANNUAL DIABETIC FOOT EXAM  Subjective: [name] presents today for for annual diabetic foot examination, at risk foot care with history of diabetic neuropathy, and painful elongated mycotic toenails 1-5 bilaterally which are tender when wearing enclosed shoe gear. Pain is relieved with periodic professional debridement.  Patient relates 10 year h/o diabetes. She is s/p CVA with right sided hemiplegia.   Patient denies any h/o foot wounds.  Patient has been diagnosed with neuropathy and it is managed with gabapentin.  Patient's blood sugar was 121 mg/dl today.   Bartholome Bill, MD is patient's PCP. Last visit was 12/28/2020.  Past Medical History:  Diagnosis Date   Aphasia due to stroke    Bipolar affective disorder ([place])    Depression    Diabetes mellitus ([place])    ?type (05/29/2013)   Episodic confusion    Hemiplegia affecting dominant side, post-stroke ([place])    right side   Hypertension    Schizophrenia ([place])    [name] 11/28/2007 (05/29/2013)   Seizures ([place])    Stroke ([place]) 05/27/2013   Patient Active Problem List   Diagnosis Date Noted   History of ischemic left MCA stroke 08/18/2019   Atypical glandular cells of undetermined significance (AGUS) on cervical Pap smear 03/19/2019   Preop examination 07/19/2018   Postmenopausal bleeding 03/07/2018   Mixed hyperlipidemia 10/03/2017   Chronic edema 09/25/2016   Lymphedema of right lower extremity 08/09/2016   Hemiparesis, aphasia, and dysphagia as late effect of cerebrovascular accident (CVA) ([place]) 06/23/2016   Seizures ([place]) 09/29/2015   Urine leukocytes 01/08/2015   Expressive aphasia 01/08/2015   Malnutrition of moderate degree ([place]) 11/11/2014   Syncope 11/08/2014   Dehydration 11/08/2014   Staring spell 07/09/2014   Adhesive capsulitis of right shoulder 06/04/2014   Palpitations 04/02/2014   Essential hypertension 01/05/2014   DM (diabetes mellitus) ([place]) 01/05/2014   Paranoid schizophrenia ([place])  12/28/2013   Cerebral embolism with cerebral infarction ([place]) 12/05/2013   Sinus tachycardia 12/05/2013   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/2013   Cerebral infarction involving left middle cerebral artery ([place]) 09/12/2013   Spastic hemiplegia affecting dominant side ([place]) 09/12/2013   CVA (cerebral infarction) 06/04/2013   Dysphagia 06/02/2013   Benign polycythemia 06/02/2013   Malignant hypertension 05/28/2013   DM (diabetes mellitus) type II controlled, neurological manifestation ([place]) 50/27/7412   Acute embolic stroke ([place]) 87/86/7672   Aphasia 05/27/2013   Facial droop due to stroke 05/27/2013   Hypertensive emergency 05/27/2013   Stroke ([place]) 09/47/0962   Aphasia complicating stroke 83/66/2947   Past Surgical History:  Procedure Laterality Date   BREAST BIOPSY Right    CESAREAN SECTION  ~ 1987   TEE WITHOUT CARDIOVERSION N/A 12/08/2013   Procedure: TRANSESOPHAGEAL ECHOCARDIOGRAM (TEE);  Surgeon: Candee Furbish, MD;  Location: [facility]COPY;  Service: Cardiovascular;  Laterality: N/A;   TUBAL LIGATION     Current Outpatient Medications on File Prior to Visit  Medication Sig Dispense Refill   insulin glargine (LANTUS SOLOSTAR) 100 UNIT/ML Solostar Pen Inject into the skin.     Accu-Chek Softclix Lancets lancets 1 Stick by [employer].(Non-Drug; Combo Route) route 3 times daily.     acetaminophen (TYLENOL) 325 MG tablet Take 1-2 tablets (325-650 mg total) by mouth every 4 (four) hours as needed for mild pain (available over the counter).     ACIDOPHILUS LACTOBACILLUS PO Take by mouth.     amLODipine (NORVASC) 10 MG tablet Take 1 tablet by mouth daily.     aspirin EC 81 MG  tablet Take by mouth.     atorvastatin (LIPITOR) 20 MG tablet Take 1 tablet by mouth daily.     betamethasone valerate ointment (VALISONE) 0.1 % APPLY TOPICALLY 2 TIMES DAILY AS NEEDED.     gabapentin (NEURONTIN) 100 MG capsule Take 1 capsule by mouth daily.     glucose blood (PRECISION QID TEST) test strip  1 strip by Misc.(Non-Drug; Combo Route) route 3 times daily.     hydrochlorothiazide (HYDRODIURIL) 25 MG tablet Take 1 tablet by mouth daily.     insulin aspart (NOVOLOG) 100 UNIT/ML injection 0-12 units as per sliding scale (Patient taking differently: Inject 0-12 Units into the skin 3 (three) times daily with meals. per sliding scale) 10 mL 3   Insulin Pen Needle (B-D UF III MINI PEN NEEDLES) 31G X 5 MM MISC Use with insulin pen daily. E11.40, Z79.4     metoprolol tartrate (LOPRESSOR) 50 MG tablet Take by mouth.     Multiple Vitamin (MULTI-VITAMIN) tablet Take 1 tablet by mouth daily.     polyethylene glycol powder (GLYCOLAX/MIRALAX) powder Take 17 g by mouth daily as needed (constipation). 527 g 3   No current facility-administered medications on file prior to visit.    No Known Allergies Social History   Occupational History   Occupation: disabled  Tobacco Use   Smoking status: Never   Smokeless tobacco: Never  Substance and Sexual Activity   Alcohol use: No    Alcohol/week: 0.0 standard drinks   Drug use: No   Sexual activity: Not on file   Family History  Problem Relation Age of Onset   Hypertension Mother    Stroke Father    Hypertension Father    Diabetes Father    Immunization History  Administered Date(s) Administered   Influenza,inj,Quad PF,6+ Mos 05/30/2013   Influenza-Unspecified 05/30/2013   Pneumococcal Polysaccharide-23 05/30/2013     Review of Systems: Negative except as noted in the HPI.   Objective: There were no vitals filed for this visit.  [name] is a pleasant 60 y.o. female in NAD. AAO X 3.  Vascular Examination: CFT <3 seconds b/l LE. Palpable PT pulse(s) b/l LE. Faintly palpable DP pulses b/l LE. Pedal hair absent. No pain with calf compression b/l. Nonpitting edema noted right lower extremity. No ischemia or gangrene noted b/l LE. No cyanosis or clubbing noted b/l LE.  Dermatological Examination: No open wounds b/l LE. No  interdigital macerations noted b/l LE. Toenails 1-5 b/l elongated, discolored, dystrophic, thickened, crumbly with subungual debris and tenderness to dorsal palpation. No hyperkeratotic nor porokeratotic lesions present on today's visit. Pedal skin noted to be dry with mild skin plaques noted b/l lower extremities.  Musculoskeletal Examination: Muscle strength 5/5 to all LE muscle groups of left lower extremity. Flaccid lower extremity noted right lower extremity. Utilizes wheelchair for mobility assistance.  Footwear Assessment: Does the patient wear appropriate shoes? Yes. Does the patient need inserts/orthotics? No..  Neurological Examination: Protective sensation diminished with 10g monofilament b/l.  Assessment: 1. Pain due to onychomycosis of toenails of both feet   2. Diabetic peripheral neuropathy associated with type 2 diabetes mellitus ([place])   3. Unilateral edema of lower extremity   4. Encounter for diabetic foot exam ([place])     ADA Risk Categorization:  High Risk  Patient has one or more of the following: Loss of protective sensation Absent pedal pulses Severe Foot deformity History of foot ulcer  Plan: -Daughter instructed to resume weekly foot soaks with baby brush  to remove dry scales. Continue moisturizer to both feet daily. -Diabetic foot examination performed today. -Continue diabetic foot care principles: inspect feet daily, monitor glucose as recommended by PCP and/or Endocrinologist, and follow prescribed diet per PCP, Endocrinologist and/or dietician. -Mycotic toenails 1-5 bilaterally were debrided in length and girth with sterile nail nippers and dremel without incident. -Patient/POA to call should there be question/concern in the interim.  Return in about 3 months (around 05/17/2021).  Marzetta Board, DPM

## 2021-03-25 LAB — COLOGUARD: COLOGUARD: NEGATIVE

## 2021-03-25 LAB — EXTERNAL GENERIC LAB PROCEDURE: COLOGUARD: NEGATIVE

## 2021-05-27 ENCOUNTER — Ambulatory Visit: Payer: Medicaid Other | Admitting: Podiatry

## 2021-06-29 ENCOUNTER — Encounter: Payer: Self-pay | Admitting: Podiatry

## 2021-06-29 ENCOUNTER — Ambulatory Visit (INDEPENDENT_AMBULATORY_CARE_PROVIDER_SITE_OTHER): Payer: Medicaid Other | Admitting: Podiatry

## 2021-06-29 DIAGNOSIS — M79674 Pain in right toe(s): Secondary | ICD-10-CM | POA: Diagnosis not present

## 2021-06-29 DIAGNOSIS — M79675 Pain in left toe(s): Secondary | ICD-10-CM | POA: Diagnosis not present

## 2021-06-29 DIAGNOSIS — B351 Tinea unguium: Secondary | ICD-10-CM | POA: Diagnosis not present

## 2021-06-29 DIAGNOSIS — E1142 Type 2 diabetes mellitus with diabetic polyneuropathy: Secondary | ICD-10-CM

## 2021-07-08 NOTE — Progress Notes (Signed)
?  Subjective:  ?Patient ID: [name], female    DOB: 1960/08/02,  MRN: [id number] ? ?[name] presents to clinic today for at risk foot care with history of diabetic neuropathy and painful elongated mycotic toenails 1-5 bilaterally which are tender when wearing enclosed shoe gear. Pain is relieved with periodic professional debridement. ? ?She is s/p CVA with aphasia, right sided weakness and chronic edema RLE. ? ?Patient states blood glucose was 123 mg/dl today.  Last HgA1c was 6.2%. ? ?New problem(s): None.  ? ?PCP is Bartholome Bill, MD , and last visit was April 28, 2021. ? ?She is accompanied by her daughter, [name], who is her primary caretaker.  ? ?No Known Allergies ? ?Review of Systems: Negative except as noted in the HPI. ? ?Objective: No changes noted in today's physical examination. ? ?SHAYLIN BLATT is a pleasant 61 y.o. female in NAD. AAO X 3. ? ?Vascular Examination: ?CFT <3 seconds b/l LE. Palpable PT pulse(s) b/l LE. Faintly palpable DP pulses b/l LE. Pedal hair absent. No pain with calf compression b/l. Nonpitting edema noted right lower extremity. No ischemia or gangrene noted b/l LE. No cyanosis or clubbing noted b/l LE. ? ?Dermatological Examination: ?No open wounds b/l LE. No interdigital macerations noted b/l LE. Toenails 1-5 b/l elongated, discolored, dystrophic, thickened, crumbly with subungual debris and tenderness to dorsal palpation. No hyperkeratotic nor porokeratotic lesions present on today's visit. Pedal skin noted to be dry with mild skin plaques noted b/l lower extremities. ? ?Musculoskeletal Examination: ?Muscle strength 5/5 to all LE muscle groups of left lower extremity. Flaccid lower extremity noted right lower extremity. Utilizes wheelchair for mobility assistance. ? ?Neurological Examination: ?Protective sensation diminished with 10g monofilament b/l. ? ?Assessment/Plan: ?1. Pain due to onychomycosis of toenails of both feet   ?2. Diabetic peripheral  neuropathy associated with type 2 diabetes mellitus ([place])   ?  ? ?-Examined patient. ?-Continue moisturizer to feet once daily. ?-Toenails 1-5 b/l were debrided in length and girth with sterile nail nippers and dremel without iatrogenic bleeding.  ?-Patient/POA to call should there be question/concern in the interim.  ? ?Return in about 3 months (around 09/28/2021). ? ?Marzetta Board, DPM  ?

## 2021-09-30 ENCOUNTER — Ambulatory Visit: Payer: Medicaid Other | Admitting: Podiatry

## 2021-10-19 ENCOUNTER — Ambulatory Visit (INDEPENDENT_AMBULATORY_CARE_PROVIDER_SITE_OTHER): Payer: Medicaid Other | Admitting: Podiatry

## 2021-10-19 ENCOUNTER — Encounter: Payer: Self-pay | Admitting: Podiatry

## 2021-10-19 DIAGNOSIS — M79674 Pain in right toe(s): Secondary | ICD-10-CM

## 2021-10-19 DIAGNOSIS — B351 Tinea unguium: Secondary | ICD-10-CM

## 2021-10-19 DIAGNOSIS — M79675 Pain in left toe(s): Secondary | ICD-10-CM

## 2021-10-19 DIAGNOSIS — E1142 Type 2 diabetes mellitus with diabetic polyneuropathy: Secondary | ICD-10-CM

## 2021-10-24 NOTE — Progress Notes (Signed)
  Subjective:  Patient ID: [name], female    DOB: October 25, 1960,  MRN: [id number]  [name] presents to clinic today for at risk foot care with history of diabetic neuropathy and painful thick toenails that are difficult to trim. Aggravating factors include wearing enclosed shoe gear. Pain is relieved with periodic professional debridement.  Patient states blood glucose was 114 mg/dl today.  Last A1c was 6.3%.  Her daughter is present during today's visit.  New problem(s): None.   PCP is Bartholome Bill, MD , and last visit was  August 26, 2021  No Known Allergies  Review of Systems: Negative except as noted in the HPI.  Objective: No changes noted in today's physical examination.  NICK ARMEL is a pleasant 61 y.o. female in NAD. AAO X 3.  Vascular Examination: CFT <3 seconds b/l LE. Palpable PT pulse(s) b/l LE. Faintly palpable DP pulses b/l LE. Pedal hair absent. No pain with calf compression b/l. Nonpitting edema noted right lower extremity. No ischemia or gangrene noted b/l LE. No cyanosis or clubbing noted b/l LE.  Dermatological Examination: No open wounds b/l LE. No interdigital macerations noted b/l LE. Toenails 1-5 b/l elongated, discolored, dystrophic, thickened, crumbly with subungual debris and tenderness to dorsal palpation. No hyperkeratotic nor porokeratotic lesions present on today's visit. Pedal skin noted to be dry with mild skin plaques noted b/l lower extremities.  Musculoskeletal Examination: Muscle strength 5/5 to all LE muscle groups of left lower extremity. Flaccid lower extremity noted right lower extremity. Utilizes wheelchair for mobility assistance.  Neurological Examination: Protective sensation diminished with 10g monofilament b/l. Assessment/Plan: 1. Pain due to onychomycosis of toenails of both feet   2. Diabetic peripheral neuropathy associated with type 2 diabetes mellitus ([place])      -Patient's family member present. All  questions/concerns addressed on today's visit. -Continue foot and shoe inspections daily. Monitor blood glucose per PCP/Endocrinologist's recommendations. -Mycotic toenails 1-5 bilaterally were debrided in length and girth with sterile nail nippers and dremel without incident. -Patient/POA to call should there be question/concern in the interim.   Return in about 3 months (around 01/19/2022).  Marzetta Board, DPM

## 2021-11-30 ENCOUNTER — Other Ambulatory Visit: Payer: Self-pay | Admitting: Family Medicine

## 2021-11-30 DIAGNOSIS — Z1231 Encounter for screening mammogram for malignant neoplasm of breast: Secondary | ICD-10-CM

## 2022-01-02 ENCOUNTER — Ambulatory Visit: Payer: Medicaid Other

## 2022-01-30 ENCOUNTER — Ambulatory Visit (INDEPENDENT_AMBULATORY_CARE_PROVIDER_SITE_OTHER): Payer: Medicaid Other | Admitting: Podiatry

## 2022-01-30 DIAGNOSIS — E1142 Type 2 diabetes mellitus with diabetic polyneuropathy: Secondary | ICD-10-CM

## 2022-01-30 DIAGNOSIS — L853 Xerosis cutis: Secondary | ICD-10-CM | POA: Diagnosis not present

## 2022-01-30 DIAGNOSIS — M79674 Pain in right toe(s): Secondary | ICD-10-CM | POA: Diagnosis not present

## 2022-01-30 DIAGNOSIS — M79675 Pain in left toe(s): Secondary | ICD-10-CM | POA: Diagnosis not present

## 2022-01-30 DIAGNOSIS — B351 Tinea unguium: Secondary | ICD-10-CM | POA: Diagnosis not present

## 2022-01-30 NOTE — Progress Notes (Signed)
  Subjective:  Patient ID: [name], female    DOB: 09/10/1960,  MRN: [id number]  [name] presents to clinic today for at risk foot care with history of diabetic neuropathy and painful elongated mycotic toenails 1-5 bilaterally which are tender when wearing enclosed shoe gear. Pain is relieved with periodic professional debridement.  Chief Complaint  Patient presents with   Nail Problem    Diabetic foot care BS-90 A1C-6.3 PCP-Tammy Boyd PCP VST-2 months ago   New problem(s): None.   PCP is Bartholome Bill, MD.  No Known Allergies  Review of Systems: Negative except as noted in the HPI.  Objective: No changes noted in today's physical examination.  [name] is a pleasant 61 y.o. female obese in NAD. AAO x 3.  Vascular Examination: CFT <3 seconds b/l LE. Palpable PT pulse(s) b/l LE. Faintly palpable DP pulses b/l LE. Pedal hair absent. No pain with calf compression b/l. Nonpitting edema noted right lower extremity. No ischemia or gangrene noted b/l LE. No cyanosis or clubbing noted b/l LE.  Dermatological Examination: No open wounds b/l LE. No interdigital macerations noted b/l LE. Toenails 1-5 b/l elongated, discolored, dystrophic, thickened, crumbly with subungual debris and tenderness to dorsal palpation. No hyperkeratotic nor porokeratotic lesions present on today's visit.   Pedal skin noted to be dry with mild skin plaques noted b/l lower extremities.  Musculoskeletal Examination: Muscle strength 5/5 to all LE muscle groups of left lower extremity. Flaccid lower extremity noted right lower extremity. Utilizes wheelchair for mobility assistance.  Neurological Examination: Protective sensation diminished with 10g monofilament b/l.  Assessment/Plan: 1. Pain due to onychomycosis of toenails of both feet   2. Xerosis cutis   3. Diabetic peripheral neuropathy associated with type 2 diabetes mellitus ([place])     No orders of the defined types were  placed in this encounter.   -Consent given for treatment as described below: -Examined patient. -For dry scaly skin, recommended once weekly foot soaks. Dry feet well and apply moisturizer. Daughter related understanding. -Toenails 1-5 b/l were debrided in length and girth with sterile nail nippers and dremel without iatrogenic bleeding.  -Patient/POA to call should there be question/concern in the interim.   Return in about 3 months (around 05/02/2022).  Marzetta Board, DPM

## 2022-02-01 ENCOUNTER — Ambulatory Visit
Admission: RE | Admit: 2022-02-01 | Discharge: 2022-02-01 | Disposition: A | Discharge status: Home / Self Care | Payer: Medicaid Other | Source: Ambulatory Visit | Attending: Family Medicine | Admitting: Family Medicine

## 2022-02-01 DIAGNOSIS — Z1231 Encounter for screening mammogram for malignant neoplasm of breast: Secondary | ICD-10-CM

## 2022-02-05 ENCOUNTER — Encounter: Payer: Self-pay | Admitting: Podiatry

## 2022-02-06 ENCOUNTER — Other Ambulatory Visit: Payer: Self-pay | Admitting: Family Medicine

## 2022-02-06 DIAGNOSIS — R928 Other abnormal and inconclusive findings on diagnostic imaging of breast: Secondary | ICD-10-CM

## 2022-02-23 ENCOUNTER — Other Ambulatory Visit: Payer: Medicaid Other

## 2022-05-24 ENCOUNTER — Ambulatory Visit: Payer: Medicaid Other | Admitting: Podiatry

## 2022-06-12 ENCOUNTER — Ambulatory Visit: Payer: Medicaid Other | Admitting: Podiatry

## 2022-10-04 ENCOUNTER — Ambulatory Visit (INDEPENDENT_AMBULATORY_CARE_PROVIDER_SITE_OTHER): Payer: Medicaid Other | Admitting: Podiatry

## 2022-10-04 VITALS — BP 138/81

## 2022-10-04 DIAGNOSIS — B351 Tinea unguium: Secondary | ICD-10-CM

## 2022-10-04 DIAGNOSIS — M79674 Pain in right toe(s): Secondary | ICD-10-CM

## 2022-10-04 DIAGNOSIS — M79675 Pain in left toe(s): Secondary | ICD-10-CM

## 2022-10-04 DIAGNOSIS — E1142 Type 2 diabetes mellitus with diabetic polyneuropathy: Secondary | ICD-10-CM | POA: Diagnosis not present

## 2022-10-09 ENCOUNTER — Encounter: Payer: Self-pay | Admitting: Podiatry

## 2022-10-09 NOTE — Progress Notes (Signed)
  Subjective:  Patient ID: [name], female    DOB: 02-05-1961,  MRN: [id number]  [name] presents to clinic today for: at risk foot care with history of diabetic neuropathy and painful elongated mycotic toenails 1-5 bilaterally which are tender when wearing enclosed shoe gear. Pain is relieved with periodic professional debridement.  Chief Complaint  Patient presents with   Nail Problem    DFC,Referring Provider Verlon Au, MD,lov:06/24,A1C:8.0      PCP is Verlon Au, MD.  No Known Allergies  Review of Systems: Negative except as noted in the HPI.  Objective: No changes noted in today's physical examination. Vitals:   10/04/22 1426  BP: 138/81    [name] is a pleasant 62 y.o. female in NAD. AAO x 3.  Vascular Examination: CFT <3 seconds b/l LE. Palpable PT pulse(s) b/l LE. Faintly palpable DP pulses b/l LE. Pedal hair absent. No pain with calf compression b/l. Nonpitting edema noted right lower extremity. No ischemia or gangrene noted b/l LE. No cyanosis or clubbing noted b/l LE.  Dermatological Examination: No open wounds b/l LE. No interdigital macerations noted b/l LE. Toenails 1-5 b/l elongated, discolored, dystrophic, thickened, crumbly with subungual debris and tenderness to dorsal palpation. No hyperkeratotic nor porokeratotic lesions present on today's visit.   Pedal skin noted to be dry with mild skin plaques noted right foot, but is improved.  Musculoskeletal Examination: Muscle strength 5/5 to all LE muscle groups of left lower extremity. Flaccid lower extremity noted right lower extremity. Utilizes wheelchair for mobility assistance.  Neurological Examination: Protective sensation diminished with 10g monofilament b/l.  Assessment/Plan: 1. Pain due to onychomycosis of toenails of both feet   2. Diabetic peripheral neuropathy associated with type 2 diabetes mellitus (HCC)   -Patient's family member present. All  questions/concerns addressed on today's visit. -Patient to continue soft, supportive shoe gear daily. -Mycotic toenails 1-5 bilaterally were debrided in length and girth with sterile nail nippers and dremel without incident. -Patient/POA to call should there be question/concern in the interim.   Return in about 3 months (around 01/04/2023).  Freddie Breech, DPM

## 2022-11-13 ENCOUNTER — Other Ambulatory Visit: Payer: Self-pay | Admitting: Family Medicine

## 2022-11-13 DIAGNOSIS — Z1231 Encounter for screening mammogram for malignant neoplasm of breast: Secondary | ICD-10-CM

## 2022-12-08 ENCOUNTER — Other Ambulatory Visit: Payer: Self-pay | Admitting: Family Medicine

## 2022-12-08 DIAGNOSIS — R928 Other abnormal and inconclusive findings on diagnostic imaging of breast: Secondary | ICD-10-CM

## 2022-12-19 ENCOUNTER — Other Ambulatory Visit: Payer: Self-pay | Admitting: Family Medicine

## 2022-12-19 DIAGNOSIS — Z1231 Encounter for screening mammogram for malignant neoplasm of breast: Secondary | ICD-10-CM

## 2023-01-10 ENCOUNTER — Encounter: Payer: Self-pay | Admitting: Podiatry

## 2023-01-10 ENCOUNTER — Ambulatory Visit (INDEPENDENT_AMBULATORY_CARE_PROVIDER_SITE_OTHER): Payer: Medicaid Other | Admitting: Podiatry

## 2023-01-10 DIAGNOSIS — B351 Tinea unguium: Secondary | ICD-10-CM

## 2023-01-10 DIAGNOSIS — M79674 Pain in right toe(s): Secondary | ICD-10-CM | POA: Diagnosis not present

## 2023-01-10 DIAGNOSIS — E1142 Type 2 diabetes mellitus with diabetic polyneuropathy: Secondary | ICD-10-CM

## 2023-01-10 DIAGNOSIS — M79675 Pain in left toe(s): Secondary | ICD-10-CM

## 2023-01-10 DIAGNOSIS — E119 Type 2 diabetes mellitus without complications: Secondary | ICD-10-CM

## 2023-01-10 DIAGNOSIS — R600 Localized edema: Secondary | ICD-10-CM

## 2023-01-16 NOTE — Progress Notes (Signed)
ANNUAL DIABETIC FOOT EXAM  Subjective: [name] presents today for annual diabetic foot exam. Patient is s/p CVA with aphasia and right sided hemiparesis She is accompanied by her daughter, [name], on today's visit. Chief Complaint  Patient presents with   dfc    DFC BS-100 A1C 6.6 12/2022 PCP APPT 05/01/2023   Patient confirms h/o diabetes.  Patient denies any h/o foot wounds.  Patient has been diagnosed with neuropathy.  Verlon Au, MD is patient's PCP.  Past Medical History:  Diagnosis Date   Aphasia due to stroke    Bipolar affective disorder (HCC)    Depression    Diabetes mellitus (HCC)    ?type (05/29/2013)   Episodic confusion    Hemiplegia affecting dominant side, post-stroke (HCC)    right side   Hypertension    Schizophrenia (HCC)    [name] 11/28/2007 (05/29/2013)   Seizures (HCC)    Stroke (HCC) 05/27/2013   Patient Active Problem List   Diagnosis Date Noted   History of ischemic left MCA stroke 08/18/2019   Atypical glandular cells of undetermined significance (AGUS) on cervical Pap smear 03/19/2019   Preop examination 07/19/2018   Postmenopausal bleeding 03/07/2018   Mixed hyperlipidemia 10/03/2017   Chronic edema 09/25/2016   Lymphedema of right lower extremity 08/09/2016   Hemiparesis, aphasia, and dysphagia as late effect of cerebrovascular accident (CVA) (HCC) 06/23/2016   Seizures (HCC) 09/29/2015   Urine leukocytes 01/08/2015   Expressive aphasia 01/08/2015   Malnutrition of moderate degree (HCC) 11/11/2014   Syncope 11/08/2014   Dehydration 11/08/2014   Staring spell 07/09/2014   Adhesive capsulitis of right shoulder 06/04/2014   Palpitations 04/02/2014   Essential hypertension 01/05/2014   DM (diabetes mellitus) (HCC) 01/05/2014   Type 2 diabetes mellitus (HCC) 01/05/2014   Type 2 diabetes mellitus with diabetic polyneuropathy, with long-term current use of insulin (HCC) 01/05/2014   Paranoid schizophrenia (HCC)  12/28/2013   Cerebral embolism with cerebral infarction (HCC) 12/05/2013   Sinus tachycardia 12/05/2013   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/2013   Cerebral infarction involving left middle cerebral artery (HCC) 09/12/2013   Spastic hemiplegia affecting dominant side (HCC) 09/12/2013   Cerebral infarction (HCC) 06/04/2013   Dysphagia 06/02/2013   Benign polycythemia 06/02/2013   Malignant hypertension 05/28/2013   DM (diabetes mellitus) type II controlled, neurological manifestation (HCC) 05/27/2013   Acute embolic stroke (HCC) 05/27/2013   Aphasia 05/27/2013   Facial droop due to stroke 05/27/2013   Hypertensive emergency 05/27/2013   Stroke (HCC) 05/27/2013   Aphasia complicating stroke 05/27/2013   Past Surgical History:  Procedure Laterality Date   BREAST BIOPSY Right    CESAREAN SECTION  ~ 1987   TEE WITHOUT CARDIOVERSION N/A 12/08/2013   Procedure: TRANSESOPHAGEAL ECHOCARDIOGRAM (TEE);  Surgeon: Donato Schultz, MD;  Location: [facility] ENDOSCOPY;  Service: Cardiovascular;  Laterality: N/A;   TUBAL LIGATION     Current Outpatient Medications on File Prior to Visit  Medication Sig Dispense Refill   Accu-Chek Softclix Lancets lancets 1 Stick by Misc.(Non-Drug; Combo Route) route 3 times daily.     acetaminophen (TYLENOL) 325 MG tablet Take 1-2 tablets (325-650 mg total) by mouth every 4 (four) hours as needed for mild pain (available over the counter).     ACIDOPHILUS LACTOBACILLUS PO Take by mouth.     amLODipine (NORVASC) 10 MG tablet Take 1 tablet by mouth daily.     amLODipine (NORVASC) 5 MG tablet Take 10 mg by mouth daily.     aspirin  EC 81 MG tablet Take by mouth.     atorvastatin (LIPITOR) 20 MG tablet Take 1 tablet by mouth daily.     betamethasone valerate ointment (VALISONE) 0.1 % APPLY TOPICALLY 2 TIMES DAILY AS NEEDED.     gabapentin (NEURONTIN) 100 MG capsule Take 1 capsule by mouth daily.     glucose blood (PRECISION QID TEST) test strip 1 strip by  Misc.(Non-Drug; Combo Route) route 3 times daily.     hydrochlorothiazide (HYDRODIURIL) 25 MG tablet Take 1 tablet by mouth daily.     insulin aspart (NOVOLOG) 100 UNIT/ML injection 0-12 units as per sliding scale (Patient taking differently: Inject 0-12 Units into the skin 3 (three) times daily with meals. per sliding scale) 10 mL 3   insulin glargine (LANTUS SOLOSTAR) 100 UNIT/ML Solostar Pen Inject into the skin.     Insulin Pen Needle (B-D UF III MINI PEN NEEDLES) 31G X 5 MM MISC Use with insulin pen daily. E11.40, Z79.4     megestrol (MEGACE) 20 MG tablet Take 20 mg by mouth 2 (two) times daily.     metoprolol tartrate (LOPRESSOR) 50 MG tablet Take by mouth.     Multiple Vitamin (MULTI-VITAMIN) tablet Take 1 tablet by mouth daily.     polyethylene glycol powder (GLYCOLAX/MIRALAX) powder Take 17 g by mouth daily as needed (constipation). 527 g 3   No current facility-administered medications on file prior to visit.    No Known Allergies Social History   Occupational History   Occupation: disabled  Tobacco Use   Smoking status: Never   Smokeless tobacco: Never  Substance and Sexual Activity   Alcohol use: No    Alcohol/week: 0.0 standard drinks of alcohol   Drug use: No   Sexual activity: Not on file   Family History  Problem Relation Age of Onset   Hypertension Mother    Stroke Father    Hypertension Father    Diabetes Father    Immunization History  Administered Date(s) Administered   Influenza,inj,Quad PF,6+ Mos 05/30/2013   Influenza-Unspecified 05/30/2013   Pneumococcal Polysaccharide-23 05/30/2013     Review of Systems: Negative except as noted in the HPI.   Objective: There were no vitals filed for this visit.  [name] is a pleasant 62 y.o. female in NAD. AAO X 3.  Title   Diabetic Foot Exam - detailed Date & Time: 01/10/2023  3:00 PM Diabetic Foot exam was performed with the following findings: Yes  Visual Foot Exam completed.: Yes  Is there a  history of foot ulcer?: No Is there a foot ulcer now?: No Is there swelling?: Yes Is there elevated skin temperature?: No Is there abnormal foot shape?: No Is there a claw toe deformity?: No Are the toenails long?: Yes Are the toenails thick?: Yes Are the toenails ingrown?: No Is the skin thin, fragile, shiny and hairless?": No Normal Range of Motion?: No Is there foot or ankle muscle weakness?: Yes Do you have pain in calf while walking?:  (Comment: Wheelchair bound) Are the shoes appropriate in style and fit?: Yes Can the patient see the bottom of their feet?: No Pulse Foot Exam completed.: Yes   Right Posterior Tibialis: Present Left posterior Tibialis: Present   Right Dorsalis Pedis: Present Left Dorsalis Pedis: Present     Sensory Foot Exam Completed.: Yes Semmes-Weinstein Monofilament Test "+" means "has sensation" and "-" means "no sensation"  R Foot Test Control: Pos L Foot Test Control: Pos   R Site 1-Great Toe:  Pos L Site 1-Great Toe: Pos   R Site 4: Pos L Site 4: Pos   R site 5: Pos L Site 5: Pos  R Site 6: Pos L Site 6: Pos     Image components are not supported.   Image components are not supported. Image components are not supported.  [place] Right vibratory: diminished Left vibratory: diminished  Comments +2 pitting edema right foot.  RLE flaccid. Muscle strength 4/5 LLE.      Lab Results  Component Value Date   HGBA1C 8.0 (H) 05/15/2018   ADA Risk Categorization: Low Risk :  Patient has all of the following: Intact protective sensation No prior foot ulcer  No severe deformity Pedal pulses present  Assessment: 1. Pain due to onychomycosis of toenails of both feet   2. Unilateral edema of lower extremity   3. Diabetic peripheral neuropathy associated with type 2 diabetes mellitus (HCC)   4. Encounter for diabetic foot exam [facility])     Plan: -Patient was evaluated today. All questions/concerns addressed on today's visit. -Patient's  family member present. All questions/concerns addressed on today's visit. -Diabetic foot examination performed today. -Continue diabetic foot care principles: inspect feet daily, monitor glucose as recommended by PCP and/or Endocrinologist, and follow prescribed diet per PCP, Endocrinologist and/or dietician. -Patient to continue soft, supportive shoe gear daily. -Toenails 1-5 b/l were debrided in length and girth with sterile nail nippers and dremel without iatrogenic bleeding.  -Patient/POA to call should there be question/concern in the interim. Return in about 3 months (around 04/12/2023).  Freddie Breech, DPM

## 2023-04-25 ENCOUNTER — Encounter: Payer: Self-pay | Admitting: Podiatry

## 2023-04-25 ENCOUNTER — Ambulatory Visit (INDEPENDENT_AMBULATORY_CARE_PROVIDER_SITE_OTHER): Payer: Medicaid Other | Admitting: Podiatry

## 2023-04-25 VITALS — Ht 65.0 in

## 2023-04-25 DIAGNOSIS — M79674 Pain in right toe(s): Secondary | ICD-10-CM

## 2023-04-25 DIAGNOSIS — M79675 Pain in left toe(s): Secondary | ICD-10-CM | POA: Diagnosis not present

## 2023-04-25 DIAGNOSIS — B351 Tinea unguium: Secondary | ICD-10-CM | POA: Diagnosis not present

## 2023-04-25 DIAGNOSIS — E1142 Type 2 diabetes mellitus with diabetic polyneuropathy: Secondary | ICD-10-CM

## 2023-04-25 NOTE — Patient Instructions (Signed)
  Shoe Recommendations for Lymphedema:  Check out the Pedors line of shoes. They are specifically for those with edema. They have a lymphedema line of shoes. I looked at them and think they may work for you.  The website is www.pedors.com  Styles to check out: Pedors Classic MAX Slides for Lymphedema Pedors Classic MAX Wrap Sandals for Lymphedema Pedors Super MAX Shoes for Swollen Feet Pedors Clasico MAX Negro [place] Para El Edema

## 2023-05-03 NOTE — Progress Notes (Signed)
  Subjective:  Patient ID: [name], female    DOB: 1960/10/24,  MRN: [id number]  63 y.o. female presents to clinic with  at risk foot care with history of diabetic neuropathy and painful mycotic toenails x 10 which interfere with daily activities. Pain is relieved with periodic professional debridement. Patient is accompanied by her daughter on today's visit. Chief Complaint  Patient presents with   [facility]    She is here for a nail trim, Last A1C was 6.0, pcp is dr jolee,    New problem(s): None   PCP is Jolee Madelin Patch, MD.  No Known Allergies  Review of Systems: Negative except as noted in the HPI.   Objective:  [name] is a pleasant 63 y.o. female in NAD. AAO x 3.  Vascular Examination: Vascular Examination: CFT <3 seconds b/l LE. Palpable PT pulse(s) b/l LE. Faintly palpable DP pulses b/l LE. Pedal hair absent. No pain with calf compression b/l. Nonpitting edema noted right lower extremity. No ischemia or gangrene noted b/l LE. No cyanosis or clubbing noted b/l LE.   Neurological Examination: Protective sensation diminished with 10g monofilament b/l.  Dermatological Examination: Toenails 1-5 b/l thick, discolored, elongated with subungual debris and pain on dorsal palpation. No hyperkeratotic lesions noted b/l.  Dry scales noted on right foot.  Musculoskeletal Examination: Muscle strength 5/5 to all LE muscle groups of left lower extremity. Flaccid lower extremity noted right lower extremity. Utilizes wheelchair for mobility assistance.  Radiographs: None  Last A1c:       No data to display           Assessment:   1. Pain due to onychomycosis of toenails of both feet   2. Diabetic peripheral neuropathy associated with type 2 diabetes mellitus (HCC)    Plan:  Patient was evaluated and treated. All patient's and/or POA's questions/concerns addressed on today's visit. Mycotic toenails 1-5 debrided in length and girth without incident.  Continue  daily foot inspections and monitor blood glucose per PCP/Endocrinologist's recommendations.Continue soft, supportive shoe gear daily. Report any pedal injuries to medical professional. Call office if there are any quesitons/concerns. -Shoe recommendations given for lymphedema. -Patient/POA to call should there be question/concern in the interim.  Return in about 4 months (around 08/23/2023).  Delon LITTIE Merlin, DPM      [place] LOCATION: [address] [place] [address]                   Office [phone]   [facility] LOCATION: [address] [place], [place] [address] Office [phone]

## 2023-08-28 ENCOUNTER — Ambulatory Visit (INDEPENDENT_AMBULATORY_CARE_PROVIDER_SITE_OTHER): Payer: Medicaid Other | Admitting: Podiatry

## 2023-08-28 DIAGNOSIS — Z91198 Patient's noncompliance with other medical treatment and regimen for other reason: Secondary | ICD-10-CM

## 2023-09-02 NOTE — Progress Notes (Signed)
 1. Failure to attend appointment with reason given    Rescheduled appointment.

## 2023-09-05 ENCOUNTER — Ambulatory Visit (INDEPENDENT_AMBULATORY_CARE_PROVIDER_SITE_OTHER): Admitting: Podiatry

## 2023-09-05 ENCOUNTER — Encounter: Payer: Self-pay | Admitting: Podiatry

## 2023-09-05 DIAGNOSIS — E1142 Type 2 diabetes mellitus with diabetic polyneuropathy: Secondary | ICD-10-CM

## 2023-09-05 DIAGNOSIS — M79674 Pain in right toe(s): Secondary | ICD-10-CM | POA: Diagnosis not present

## 2023-09-05 DIAGNOSIS — B351 Tinea unguium: Secondary | ICD-10-CM | POA: Diagnosis not present

## 2023-09-05 DIAGNOSIS — M79675 Pain in left toe(s): Secondary | ICD-10-CM

## 2023-09-11 NOTE — Progress Notes (Signed)
  Subjective:  Patient ID: [name], female    DOB: 1960-12-20,  MRN: [id number]  [name] presents to clinic today for at risk foot care with history of diabetic neuropathy and painful thick toenails that are difficult to trim. Pain interferes with ambulation. Aggravating factors include wearing enclosed shoe gear. Pain is relieved with periodic professional debridement. She is accompanied by her daughter on today's visit. Daughter would like clarification on recommended shoe gear for Mom's pedal edema. Chief Complaint  Patient presents with   DFc    Rm15/ Diabetic /A1c 7.1 /Blood sugar 130 Dr. Jolee Last visit September 03, 2023    New problem(s): None.   PCP is Jolee Madelin Patch, MD.  No Known Allergies  Review of Systems: Negative except as noted in the HPI.  Objective: No changes noted in today's physical examination. There were no vitals filed for this visit. ALVILDA Compton is a pleasant 63 y.o. female in NAD. AAO x 3.  Vascular Examination: CFT <3 seconds b/l. DP pulses faintly palpable b/l. PT pulses palpable b/l. Digital hair absent. Skin temperature gradient warm to warm b/l. No pain with calf compression. No ischemia or gangrene. No cyanosis or clubbing noted b/l. Nonpitting edema noted right foot. No cyanosis or clubbing noted b/l LE.   Neurological Examination: Protective sensation diminished with 10g monofilament b/l.  Dermatological Examination: Pedal skin warm and supple b/l. No open wounds. No interdigital macerations. Toenails 1-5 b/l thick, discolored, elongated with subungual debris and pain on dorsal palpation.  Dry plaques noted right foot.  Musculoskeletal Examination: Muscle strength 5/5 to all LE muscle groups of left lower extremity. Flaccid lower extremity noted right lower extremity. Utilizes wheelchair for mobility assistance.  Radiographs: None  Assessment/Plan: 1. Pain due to onychomycosis of toenails of both feet   2. Diabetic  peripheral neuropathy associated with type 2 diabetes mellitus (HCC)   -Consent given for treatment as described below: -Examined patient. -Continue moisturizer to feet daily. -Patient to continue soft, supportive shoe gear daily. -Toenails 1-5 b/l were debrided in length and girth with sterile nail nippers and dremel without iatrogenic bleeding.  -Discussed Pedors shoes for pedal edema. Daughter was instructed to contact vendor to discuss issue of unilateral edema in regards to patient needing two different widths. She related understanding. -Patient/POA to call should there be question/concern in the interim.   Return in about 3 months (around 12/06/2023).  Delon LITTIE Merlin, DPM      [place] LOCATION: [address], [place] [address]                   Office [phone]   [facility] LOCATION: [address] [place], [place] [address] Office [phone]

## 2023-12-26 ENCOUNTER — Encounter: Payer: Self-pay | Admitting: Podiatry

## 2023-12-26 ENCOUNTER — Ambulatory Visit: Admitting: Podiatry

## 2023-12-26 DIAGNOSIS — M79675 Pain in left toe(s): Secondary | ICD-10-CM | POA: Diagnosis not present

## 2023-12-26 DIAGNOSIS — M79674 Pain in right toe(s): Secondary | ICD-10-CM | POA: Diagnosis not present

## 2023-12-26 DIAGNOSIS — E1142 Type 2 diabetes mellitus with diabetic polyneuropathy: Secondary | ICD-10-CM

## 2023-12-26 DIAGNOSIS — B351 Tinea unguium: Secondary | ICD-10-CM

## 2023-12-31 NOTE — Progress Notes (Signed)
  Subjective:  Patient ID: [name], female    DOB: [birth year],  MRN: [id number]  [name] presents to clinic today for at risk foot care with history of diabetic neuropathy and painful mycotic toenails x 10 which interfere with daily activities. Pain is relieved with periodic professional debridement. 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 Her daughter is present during today's visit. Chief Complaint  Patient presents with   Diabetes    DFC IDDM A1C 8.0. Toemail trim. LOV with PCP 09/03/23. Swelling in the right foot.   New problem(s): None.   PCP is Jolee Madelin Patch, MD.  No Known Allergies  Review of Systems: Negative except as noted in the HPI.  Objective: No changes noted in today's physical examination. There were no vitals filed for this visit. [name] is a pleasant 63 y.o. female WD, WN in NAD. AAO x 3.  Vascular Examination: CFT <3 seconds b/l. DP pulses faintly palpable b/l. PT pulses palpable b/l. Digital hair absent. Skin temperature gradient warm to warm b/l. No pain with calf compression. No ischemia or gangrene. No cyanosis or clubbing noted b/l. Chronic nonpitting edema noted right foot. No cyanosis or clubbing noted b/l LE.   Neurological Examination: Protective sensation diminished with 10g monofilament b/l.  Dermatological Examination: Pedal skin warm and supple b/l. No open wounds. No interdigital macerations. Toenails 1-5 b/l thick, discolored, elongated with subungual debris and pain on dorsal palpation.  Dry plaques noted right foot.  Musculoskeletal Examination: Muscle strength 5/5 to all LE muscle groups of left lower extremity. Flaccid lower extremity noted right lower extremity. Utilizes wheelchair for mobility assistance.  Radiographs: None  Assessment/Plan: 1. Pain due to onychomycosis of toenails of both feet   2. Diabetic peripheral neuropathy associated with type 2 diabetes mellitus (HCC)   Patient was  evaluated and treated. All patient's and/or POA's questions/concerns addressed on today's visit. Mycotic toenails 1-5 debrided in length and girth without incident.  Continue daily foot inspections and monitor blood glucose per PCP/Endocrinologist's recommendations.Continue soft, supportive shoe gear daily. Report any pedal injuries to medical professional. Call office if there are any quesitons/concerns. -Patient/POA to call should there be question/concern in the interim.   Return in about 3 months (around 03/27/2024).  [name], DPM      [facility] LOCATION: [address], [place] [address]                   Office [phone]   [facility] LOCATION: [address] [place], [place] [address] Office [phone]

## 2024-03-31 ENCOUNTER — Emergency Department (HOSPITAL_COMMUNITY)

## 2024-03-31 ENCOUNTER — Emergency Department (HOSPITAL_COMMUNITY)
Admission: EM | Admit: 2024-03-31 | Discharge: 2024-03-31 | Discharge status: Against Medical Advice | Attending: Emergency Medicine | Admitting: Emergency Medicine

## 2024-03-31 DIAGNOSIS — R4701 Aphasia: Secondary | ICD-10-CM | POA: Insufficient documentation

## 2024-03-31 DIAGNOSIS — Z5321 Procedure and treatment not carried out due to patient leaving prior to being seen by health care provider: Secondary | ICD-10-CM | POA: Insufficient documentation

## 2024-03-31 DIAGNOSIS — R55 Syncope and collapse: Secondary | ICD-10-CM

## 2024-03-31 DIAGNOSIS — Z8673 Personal history of transient ischemic attack (TIA), and cerebral infarction without residual deficits: Secondary | ICD-10-CM | POA: Diagnosis not present

## 2024-03-31 LAB — COMPREHENSIVE METABOLIC PANEL WITH GFR
ALT: 15 U/L (ref 0–44)
AST: 24 U/L (ref 15–41)
Albumin: 4.5 g/dL (ref 3.5–5.0)
Alkaline Phosphatase: 108 U/L (ref 38–126)
Anion gap: 16 — ABNORMAL HIGH (ref 5–15)
BUN: 17 mg/dL (ref 8–23)
CO2: 26 mmol/L (ref 22–32)
Calcium: 9.7 mg/dL (ref 8.9–10.3)
Chloride: 98 mmol/L (ref 98–111)
Creatinine, Ser: 1.25 mg/dL — ABNORMAL HIGH (ref 0.44–1.00)
GFR, Estimated: 49 mL/min — ABNORMAL LOW
Glucose, Bld: 206 mg/dL — ABNORMAL HIGH (ref 70–99)
Potassium: 3.5 mmol/L (ref 3.5–5.1)
Sodium: 139 mmol/L (ref 135–145)
Total Bilirubin: 0.6 mg/dL (ref 0.0–1.2)
Total Protein: 9 g/dL — ABNORMAL HIGH (ref 6.5–8.1)

## 2024-03-31 LAB — CBC WITH DIFFERENTIAL/PLATELET
Abs Immature Granulocytes: 0.06 K/uL (ref 0.00–0.07)
Basophils Absolute: 0 K/uL (ref 0.0–0.1)
Basophils Relative: 0 %
Eosinophils Absolute: 0 K/uL (ref 0.0–0.5)
Eosinophils Relative: 0 %
HCT: 46.2 % — ABNORMAL HIGH (ref 36.0–46.0)
Hemoglobin: 15.8 g/dL — ABNORMAL HIGH (ref 12.0–15.0)
Immature Granulocytes: 0 %
Lymphocytes Relative: 12 %
Lymphs Abs: 1.8 K/uL (ref 0.7–4.0)
MCH: 30.8 pg (ref 26.0–34.0)
MCHC: 34.2 g/dL (ref 30.0–36.0)
MCV: 90.1 fL (ref 80.0–100.0)
Monocytes Absolute: 0.8 K/uL (ref 0.1–1.0)
Monocytes Relative: 5 %
Neutro Abs: 12.3 K/uL — ABNORMAL HIGH (ref 1.7–7.7)
Neutrophils Relative %: 83 %
Platelets: 326 K/uL (ref 150–400)
RBC: 5.13 MIL/uL — ABNORMAL HIGH (ref 3.87–5.11)
RDW: 12.3 % (ref 11.5–15.5)
WBC: 15 K/uL — ABNORMAL HIGH (ref 4.0–10.5)
nRBC: 0 % (ref 0.0–0.2)

## 2024-03-31 LAB — TROPONIN T, HIGH SENSITIVITY: Troponin T High Sensitivity: 39 ng/L — ABNORMAL HIGH (ref 0–19)

## 2024-03-31 LAB — MAGNESIUM: Magnesium: 2.2 mg/dL (ref 1.7–2.4)

## 2024-03-31 NOTE — ED Notes (Signed)
 Pt left AMA-no longer could wait and will visit PCP tomorrow

## 2024-03-31 NOTE — ED Triage Notes (Addendum)
 Pt BIb [facility] for syncopal episode while on toilet. Pt has Rt side deficits from previous stroke. Daughter was with her and stated she was not responding.   Pt has some aphasia at baseline but can say a few words per EMS. EMS states that she will nod or grunt yes and no to most questions and seems to answer appropriately.  Daughter should be coming.   140/70 HR 87, 99% RA, RR 18 CBG 177.

## 2024-03-31 NOTE — ED Provider Triage Note (Signed)
 Emergency Medicine Provider Triage Evaluation Note  CALEN POSCH , a 64 y.o. female  was evaluated in triage.  Pt complains of syncope.  Review of Systems  (Patient unable to provide history due to speech deficits from prior CVA.)  Physical Exam  BP (!) 145/73 (BP Location: Left Arm)   Pulse 96   Temp 98.1 F (36.7 C) (Oral)   Resp 16   SpO2 100%  Gen:   Awake, no distress   Resp:  Normal effort  MSK:   Moves extremities without difficulty  Other:  Right hemibody weakness, reportedly baseline  Medical Decision Making  Medically screening exam initiated at 1:44 PM.  Appropriate orders placed.  Austyn A Gaona was informed that the remainder of the evaluation will be completed by another provider, this initial triage assessment does not replace that evaluation, and the importance of remaining in the ED until their evaluation is complete.  Patient presents via EMS due to reported episode of syncope while on the toilet at home.  She has speech difficulty and right hemibody weakness from prior CVA.  Patient is unable to provide any history.  She is well-appearing on exam.  Vital signs are reassuring.   Melvenia Motto, MD 03/31/24 1345

## 2024-04-06 LAB — COLOGUARD: COLOGUARD: NEGATIVE

## 2024-04-08 ENCOUNTER — Ambulatory Visit: Admitting: Podiatry

## 2024-04-08 ENCOUNTER — Encounter: Payer: Self-pay | Admitting: Podiatry

## 2024-04-08 DIAGNOSIS — M79674 Pain in right toe(s): Secondary | ICD-10-CM

## 2024-04-08 DIAGNOSIS — I69359 Hemiplegia and hemiparesis following cerebral infarction affecting unspecified side: Secondary | ICD-10-CM | POA: Diagnosis not present

## 2024-04-08 DIAGNOSIS — L988 Other specified disorders of the skin and subcutaneous tissue: Secondary | ICD-10-CM

## 2024-04-08 DIAGNOSIS — I69320 Aphasia following cerebral infarction: Secondary | ICD-10-CM

## 2024-04-08 DIAGNOSIS — E1142 Type 2 diabetes mellitus with diabetic polyneuropathy: Secondary | ICD-10-CM

## 2024-04-08 DIAGNOSIS — E119 Type 2 diabetes mellitus without complications: Secondary | ICD-10-CM

## 2024-04-08 DIAGNOSIS — B351 Tinea unguium: Secondary | ICD-10-CM

## 2024-04-08 DIAGNOSIS — R600 Localized edema: Secondary | ICD-10-CM

## 2024-04-08 DIAGNOSIS — Z0189 Encounter for other specified special examinations: Secondary | ICD-10-CM | POA: Diagnosis not present

## 2024-04-08 DIAGNOSIS — M79675 Pain in left toe(s): Secondary | ICD-10-CM

## 2024-04-08 DIAGNOSIS — I69391 Dysphagia following cerebral infarction: Secondary | ICD-10-CM | POA: Diagnosis not present

## 2024-04-13 NOTE — Progress Notes (Unsigned)
"  °  Subjective:  Patient ID: [name], female    DOB: 28-Sep-1960,  MRN: [id number]  [name] presents to clinic today for at risk foot care with history of diabetic neuropathy and painful thick toenails that are difficult to trim. Pain interferes with ambulation. Aggravating factors include wearing enclosed shoe gear. Pain is relieved with periodic professional debridement. Patient is  Chief Complaint  Patient presents with   [facility]    IDDM patient with an A1c of 6.4 presents today for [facility] and nail trim  PCP: Madelin Brought  Las medical visit 3 MO    New problem(s): None.   PCP is Brought Madelin Patch, MD.  Allergies[1]  Review of Systems: Negative except as noted in the HPI.  Objective: No changes noted in today's physical examination. There were no vitals filed for this visit. [name] is a pleasant 64 y.o. female WD, WN in NAD. AAO x 3.  {Perform Simple Foot Exam  Perform Detailed exam:1} {Insert foot Exam (Optional):30965}   Assessment/Plan: No diagnosis found.  No orders of the defined types were placed in this encounter.   None {Jgplan:23602::-Patient/POA to call should there be question/concern in the interim.}   Return in about 3 months (around 07/07/2024).  [name], DPM      [facility] LOCATION: [address] [place] [address]                   Office [phone]   [facility] LOCATION: [address] [place], [place] [address] Office [phone]     [1] No Known Allergies  "

## 2024-07-09 ENCOUNTER — Ambulatory Visit: Admitting: Podiatry
